# Patient Record
Sex: Female | Born: 1945 | Race: Black or African American | Hispanic: No | Marital: Single | Smoking: Never smoker
Health system: Southern US, Community
[De-identification: ages and names within clinical notes are randomized; demographics above are authoritative.]

## PROBLEM LIST (undated history)

## (undated) DIAGNOSIS — I10 Essential (primary) hypertension: Secondary | ICD-10-CM

## (undated) DIAGNOSIS — E049 Nontoxic goiter, unspecified: Secondary | ICD-10-CM

## (undated) DIAGNOSIS — Z95810 Presence of automatic (implantable) cardiac defibrillator: Secondary | ICD-10-CM

## (undated) DIAGNOSIS — Z515 Encounter for palliative care: Secondary | ICD-10-CM

## (undated) DIAGNOSIS — I471 Supraventricular tachycardia, unspecified: Secondary | ICD-10-CM

## (undated) DIAGNOSIS — E876 Hypokalemia: Secondary | ICD-10-CM

## (undated) DIAGNOSIS — Z86718 Personal history of other venous thrombosis and embolism: Secondary | ICD-10-CM

## (undated) DIAGNOSIS — I428 Other cardiomyopathies: Secondary | ICD-10-CM

## (undated) DIAGNOSIS — I447 Left bundle-branch block, unspecified: Secondary | ICD-10-CM

## (undated) DIAGNOSIS — I509 Heart failure, unspecified: Secondary | ICD-10-CM

## (undated) DIAGNOSIS — I5022 Chronic systolic (congestive) heart failure: Secondary | ICD-10-CM

## (undated) DIAGNOSIS — E785 Hyperlipidemia, unspecified: Secondary | ICD-10-CM

## (undated) DIAGNOSIS — I472 Ventricular tachycardia, unspecified: Secondary | ICD-10-CM

## (undated) HISTORY — DX: Hyperlipidemia, unspecified: E78.5

## (undated) HISTORY — PX: OTHER SURGICAL HISTORY: SHX169

## (undated) HISTORY — PX: CARDIAC CATHETERIZATION: SHX172

## (undated) HISTORY — DX: Hypokalemia: E87.6

## (undated) HISTORY — DX: Ventricular tachycardia: I47.2

## (undated) HISTORY — DX: Ventricular tachycardia, unspecified: I47.20

## (undated) HISTORY — DX: Heart failure, unspecified: I50.9

## (undated) HISTORY — DX: Essential (primary) hypertension: I10

## (undated) HISTORY — DX: Nontoxic goiter, unspecified: E04.9

---

## 1999-01-27 ENCOUNTER — Encounter: Payer: Self-pay | Admitting: Emergency Medicine

## 1999-01-27 ENCOUNTER — Emergency Department (HOSPITAL_COMMUNITY): Admission: EM | Admit: 1999-01-27 | Discharge: 1999-01-27 | Payer: Self-pay | Admitting: Emergency Medicine

## 1999-05-04 DIAGNOSIS — E049 Nontoxic goiter, unspecified: Secondary | ICD-10-CM

## 1999-05-04 HISTORY — DX: Nontoxic goiter, unspecified: E04.9

## 2000-01-20 ENCOUNTER — Other Ambulatory Visit: Admission: RE | Admit: 2000-01-20 | Discharge: 2000-01-20 | Payer: Self-pay | Admitting: Family Medicine

## 2000-01-25 ENCOUNTER — Ambulatory Visit (HOSPITAL_COMMUNITY): Admission: RE | Admit: 2000-01-25 | Discharge: 2000-01-25 | Payer: Self-pay | Admitting: Family Medicine

## 2000-01-25 ENCOUNTER — Encounter: Payer: Self-pay | Admitting: Family Medicine

## 2007-05-04 DIAGNOSIS — Z95810 Presence of automatic (implantable) cardiac defibrillator: Secondary | ICD-10-CM

## 2007-05-04 HISTORY — DX: Presence of automatic (implantable) cardiac defibrillator: Z95.810

## 2007-05-13 ENCOUNTER — Inpatient Hospital Stay (HOSPITAL_COMMUNITY): Admission: EM | Admit: 2007-05-13 | Discharge: 2007-05-15 | Payer: Self-pay | Admitting: Emergency Medicine

## 2007-05-15 ENCOUNTER — Encounter (INDEPENDENT_AMBULATORY_CARE_PROVIDER_SITE_OTHER): Payer: Self-pay | Admitting: *Deleted

## 2007-07-06 ENCOUNTER — Ambulatory Visit: Payer: Self-pay | Admitting: Cardiology

## 2007-07-20 ENCOUNTER — Ambulatory Visit: Payer: Self-pay | Admitting: Cardiology

## 2007-08-02 ENCOUNTER — Ambulatory Visit: Payer: Self-pay | Admitting: Cardiology

## 2007-08-02 LAB — CONVERTED CEMR LAB
BUN: 16 mg/dL (ref 6–23)
Basophils Relative: 0.7 % (ref 0.0–1.0)
CO2: 26 meq/L (ref 19–32)
Calcium: 9.7 mg/dL (ref 8.4–10.5)
Chloride: 109 meq/L (ref 96–112)
Creatinine, Ser: 1 mg/dL (ref 0.4–1.2)
Eosinophils Absolute: 0.1 10*3/uL (ref 0.0–0.7)
Eosinophils Relative: 1.9 % (ref 0.0–5.0)
GFR calc non Af Amer: 60 mL/min
Hemoglobin: 13.1 g/dL (ref 12.0–15.0)
MCV: 89.6 fL (ref 78.0–100.0)
Neutro Abs: 5.2 10*3/uL (ref 1.4–7.7)
Neutrophils Relative %: 67.6 % (ref 43.0–77.0)
RBC: 4.45 M/uL (ref 3.87–5.11)
WBC: 7.7 10*3/uL (ref 4.5–10.5)

## 2007-08-04 ENCOUNTER — Ambulatory Visit (HOSPITAL_COMMUNITY): Admission: RE | Admit: 2007-08-04 | Discharge: 2007-08-04 | Payer: Self-pay | Admitting: Cardiology

## 2007-08-04 ENCOUNTER — Ambulatory Visit: Payer: Self-pay | Admitting: Cardiology

## 2007-09-07 ENCOUNTER — Ambulatory Visit: Payer: Self-pay | Admitting: Cardiology

## 2008-01-01 ENCOUNTER — Ambulatory Visit: Payer: Self-pay | Admitting: Cardiology

## 2008-07-26 DIAGNOSIS — I428 Other cardiomyopathies: Secondary | ICD-10-CM

## 2008-07-26 DIAGNOSIS — J811 Chronic pulmonary edema: Secondary | ICD-10-CM | POA: Insufficient documentation

## 2008-07-26 DIAGNOSIS — E876 Hypokalemia: Secondary | ICD-10-CM | POA: Insufficient documentation

## 2008-07-26 DIAGNOSIS — I471 Supraventricular tachycardia, unspecified: Secondary | ICD-10-CM | POA: Insufficient documentation

## 2008-07-26 DIAGNOSIS — E785 Hyperlipidemia, unspecified: Secondary | ICD-10-CM

## 2008-07-26 DIAGNOSIS — I10 Essential (primary) hypertension: Secondary | ICD-10-CM | POA: Insufficient documentation

## 2008-07-26 DIAGNOSIS — I82409 Acute embolism and thrombosis of unspecified deep veins of unspecified lower extremity: Secondary | ICD-10-CM | POA: Insufficient documentation

## 2008-07-26 DIAGNOSIS — I509 Heart failure, unspecified: Secondary | ICD-10-CM | POA: Insufficient documentation

## 2008-07-29 ENCOUNTER — Encounter: Payer: Self-pay | Admitting: Cardiology

## 2008-07-29 ENCOUNTER — Ambulatory Visit: Payer: Self-pay | Admitting: Cardiology

## 2009-01-31 HISTORY — PX: CARDIAC DEFIBRILLATOR PLACEMENT: SHX171

## 2009-02-21 ENCOUNTER — Ambulatory Visit: Payer: Self-pay | Admitting: Cardiology

## 2009-02-21 ENCOUNTER — Inpatient Hospital Stay (HOSPITAL_COMMUNITY): Admission: EM | Admit: 2009-02-21 | Discharge: 2009-02-26 | Payer: Self-pay | Admitting: Emergency Medicine

## 2009-02-22 ENCOUNTER — Encounter: Payer: Self-pay | Admitting: Cardiology

## 2009-02-24 ENCOUNTER — Encounter: Payer: Self-pay | Admitting: Internal Medicine

## 2009-02-25 ENCOUNTER — Encounter: Payer: Self-pay | Admitting: Cardiology

## 2009-02-26 ENCOUNTER — Encounter: Payer: Self-pay | Admitting: Internal Medicine

## 2009-02-27 ENCOUNTER — Telehealth: Payer: Self-pay | Admitting: Internal Medicine

## 2009-03-05 ENCOUNTER — Ambulatory Visit: Payer: Self-pay

## 2009-03-05 ENCOUNTER — Encounter: Payer: Self-pay | Admitting: Internal Medicine

## 2009-03-07 ENCOUNTER — Encounter: Payer: Self-pay | Admitting: Cardiology

## 2009-03-18 ENCOUNTER — Encounter: Payer: Self-pay | Admitting: Cardiology

## 2009-03-19 ENCOUNTER — Ambulatory Visit: Payer: Self-pay | Admitting: Cardiology

## 2009-03-24 ENCOUNTER — Ambulatory Visit: Payer: Self-pay | Admitting: Cardiology

## 2009-03-24 DIAGNOSIS — I5022 Chronic systolic (congestive) heart failure: Secondary | ICD-10-CM

## 2009-03-25 LAB — CONVERTED CEMR LAB
BUN: 23 mg/dL (ref 6–23)
CO2: 31 meq/L (ref 19–32)
Chloride: 99 meq/L (ref 96–112)
Creatinine, Ser: 1.3 mg/dL — ABNORMAL HIGH (ref 0.4–1.2)

## 2009-04-21 ENCOUNTER — Ambulatory Visit: Payer: Self-pay | Admitting: Cardiology

## 2009-05-09 ENCOUNTER — Encounter (INDEPENDENT_AMBULATORY_CARE_PROVIDER_SITE_OTHER): Payer: Self-pay | Admitting: *Deleted

## 2009-05-27 ENCOUNTER — Ambulatory Visit: Payer: Self-pay | Admitting: Internal Medicine

## 2009-08-26 ENCOUNTER — Ambulatory Visit: Payer: Self-pay | Admitting: Internal Medicine

## 2009-10-02 ENCOUNTER — Ambulatory Visit: Payer: Self-pay | Admitting: Cardiology

## 2009-10-03 ENCOUNTER — Telehealth: Payer: Self-pay | Admitting: Cardiology

## 2010-02-24 ENCOUNTER — Encounter: Payer: Self-pay | Admitting: Internal Medicine

## 2010-02-24 ENCOUNTER — Ambulatory Visit: Payer: Self-pay | Admitting: Internal Medicine

## 2010-02-24 DIAGNOSIS — Z95810 Presence of automatic (implantable) cardiac defibrillator: Secondary | ICD-10-CM | POA: Insufficient documentation

## 2010-04-09 ENCOUNTER — Encounter: Payer: Self-pay | Admitting: Cardiology

## 2010-04-09 ENCOUNTER — Ambulatory Visit: Payer: Self-pay | Admitting: Cardiology

## 2010-04-09 DIAGNOSIS — G4700 Insomnia, unspecified: Secondary | ICD-10-CM

## 2010-04-14 ENCOUNTER — Ambulatory Visit: Payer: Self-pay | Admitting: Cardiology

## 2010-04-17 ENCOUNTER — Telehealth (INDEPENDENT_AMBULATORY_CARE_PROVIDER_SITE_OTHER): Payer: Self-pay | Admitting: *Deleted

## 2010-04-17 ENCOUNTER — Encounter
Admission: RE | Admit: 2010-04-17 | Discharge: 2010-04-17 | Payer: Self-pay | Source: Home / Self Care | Attending: Family Medicine | Admitting: Family Medicine

## 2010-04-20 ENCOUNTER — Encounter: Payer: Self-pay | Admitting: Cardiology

## 2010-04-20 LAB — CONVERTED CEMR LAB
ALT: 26 U/L
AST: 22 U/L
Albumin: 3.9 g/dL
Alkaline Phosphatase: 68 U/L
Bilirubin, Direct: 0.2 mg/dL
Cholesterol: 153 mg/dL
HDL: 44.6 mg/dL
LDL Cholesterol: 92 mg/dL
Total Bilirubin: 1.1 mg/dL
Total CHOL/HDL Ratio: 3
Total Protein: 6.5 g/dL
Triglycerides: 84 mg/dL
VLDL: 16.8 mg/dL

## 2010-05-05 ENCOUNTER — Encounter: Payer: Self-pay | Admitting: Internal Medicine

## 2010-05-05 ENCOUNTER — Telehealth: Payer: Self-pay | Admitting: Internal Medicine

## 2010-05-06 ENCOUNTER — Ambulatory Visit
Admission: RE | Admit: 2010-05-06 | Discharge: 2010-05-06 | Payer: Self-pay | Source: Home / Self Care | Attending: Internal Medicine | Admitting: Internal Medicine

## 2010-05-06 ENCOUNTER — Encounter: Payer: Self-pay | Admitting: Internal Medicine

## 2010-05-21 ENCOUNTER — Encounter: Payer: Self-pay | Admitting: Cardiology

## 2010-05-21 ENCOUNTER — Encounter: Payer: Self-pay | Admitting: Internal Medicine

## 2010-05-21 ENCOUNTER — Telehealth: Payer: Self-pay | Admitting: Internal Medicine

## 2010-06-02 NOTE — Progress Notes (Signed)
Summary: Need to verify medication   Phone Note From Pharmacy Call back at [phone]   Caller: [facility] REF# [id number] Summary of Call: Pt needs to get Benazepril 10 mg verified Initial call taken by: Judie Grieve,  October 03, 2009 1:45 PM  Follow-up for Phone Call        Spoke with pharmacy. Marrion Coy, CNA  October 03, 2009 2:36 PM  Follow-up by: Marrion Coy, CNA,  October 03, 2009 2:36 PM

## 2010-06-02 NOTE — Letter (Signed)
Summary: [employer], Main Office  [address]   [place], [place] [address]   Phone: [phone]  Fax: [phone]         April 09, 2010    Kaitlin [name]   The above named patient had a medical visit today.  Please take this into consideration when reviewing the time away from work/school.      Sincerely yours,     Sander Nephew, RN for  Dr. Rollene Rotunda [facility]

## 2010-06-02 NOTE — Assessment & Plan Note (Signed)
Summary: device/saf      Allergies Added: NKDA  Visit Type:  Follow-up Primary Provider:  Mirna Mires, MD   History of Present Illness: Ms. Donovan returns today for followup.  She is a pleasant 65 yo woman with a h/o DCM, CHF, and dyslipidemia.  She returns today for ICD followup.  She denies c/p, sob, or peripheral edema.  She denies intercurrent ICD therapies.  She continues to work regularly and her CHF remains class 2.  Current Medications (verified): 1)  Benazepril Hcl 10 Mg Tabs (Benazepril Hcl) .[name].. 1 and 1/2 Tablet Daily 2)  Coreg 12.5 Mg Tabs (Carvedilol) .[name].. 1 1/2 By Mouth Two Times A Day 3)  Simvastatin 40 Mg Tabs (Simvastatin) .... One By Mouth Daily 4)  Fish Oil .... One By Mouth Daily 5)  Mvi .... One By Mouth Daliy 6)  Asprin 81mg  .... One By Mouth Daily 7)  Tylenol 325 Mg Tabs (Acetaminophen) .... As Needed 8)  Gabapentin 100 Mg Caps (Gabapentin) .... Two At Bedtime As Needed 9)  Lasix 40 Mg Tabs (Furosemide) .... One Tablet Daily or As Directed 10)  Potassium Chloride Crys Cr 20 Meq Cr-Tabs (Potassium Chloride Crys Cr) .... One Daily 11)  Ambien 5 Mg Tabs (Zolpidem Tartrate) .... 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    Vital Signs:  Patient profile:   65 year old female Height:      64 inches Weight:      180 pounds BMI:     31.01 Pulse rate:   79 / minute BP sitting:   96 / 72  (left arm)  Vitals Entered By: Laurance Flatten CMA (August 26, 2009 3:59  PM)  Physical Exam  General:  Well developed, well nourished, in no acute distress. Head:  normocephalic and atraumatic Eyes:  PERRLA/EOM intact; conjunctiva and lids normal. Mouth:  Teeth, gums and palate normal. Oral mucosa normal. Neck:  Neck supple, no JVD. No masses, thyromegaly or abnormal cervical nodes. Chest Wall:  well-healed ICD pocket Lungs:  Clear bilaterally to auscultation. No wheezes, rales, or rhonchi Heart:  RRR with normal S1 and S2. Abdomen:  Bowel sounds positive; abdomen soft and non-tender without masses, organomegaly, or hernias noted. No hepatosplenomegaly. Msk:  Back normal, normal gait. Muscle strength and tone normal. Pulses:  pulses normal in all 4 extremities Extremities:  mild nonpitting lower extremity edema Neurologic:  Alert and oriented x 3.    ICD Specifications Following MD:  Lewayne Bunting, MD     ICD Vendor:  [employer]     ICD Model Number:  540-VRT     ICD Serial Number:  [id number] ICD DOI:  02/24/2009     ICD Implanting MD:  Lewayne Bunting, MD  Lead 1:    Location: RV     DOI: 02/24/2009     Model #: [id number]     Serial #: [id number]     Status: active  Indications::  VT   ICD Follow Up Remote Check?  No Battery Voltage:  3.09 V     Charge Time:  10.5 seconds  Underlying rhythm:  SR@75  ICD Dependent:  No       ICD Device Measurements Right Ventricle:  Amplitude: 12.7 mV, Impedance: 592 ohms, Threshold: 0.4 V at 0.5 msec  Episodes Coumadin:  No Shock:  0     ATP:  3     Nonsustained:  0     Ventricular Pacing:  1%  Brady Parameters Mode VVI     Lower Rate Limit:  45      Tachy Zones VF:  240     VT:  207     VT1:  176     Next Remote Date:  11/27/2009     Next Cardiology Appt Due:  01/31/2010 Tech Comments:  No parameter changes.  Device function normal.  Checked by industry.  Remote every 3 months.  ROV 10/11 with Dr. Ladona Ridgel. Altha Harm, LPN  August 26, 2009 4:28 PM  MD Comments:  2 episodes of VT with successful ATP.  Impression &  Recommendations:  Problem # 1:  VENTRICULAR TACHYCARDIA (ICD-427.1) The patient had two episodes which were terminated with ATP. This is a new problem.  I have considered starting an anti-arrhythmic drug but I will hold on this for now. Her updated medication list for this problem includes:    Benazepril Hcl 10 Mg Tabs (Benazepril hcl) .[name]... 1 and 1/2 tablet daily    Coreg 12.5 Mg Tabs (Carvedilol) .[name]... 1 1/2 by mouth two times a day  Problem # 2:  CHRONIC SYSTOLIC HEART FAILURE (ICD-428.22) Her CHF is class 2. She will continue her current meds and maintain a low sodium diet. Her updated medication list for this problem includes:    Benazepril Hcl 10 Mg Tabs (Benazepril hcl) .[name]... 1 and 1/2 tablet daily    Coreg 12.5 Mg Tabs (Carvedilol) .[name]... 1 1/2 by mouth two times a day    Lasix 40 Mg Tabs (Furosemide) ..... One tablet daily or as directed  Problem # 3:  DYSLIPIDEMIA (ICD-272.4) A low fat diet and her current medical therapy are recommended. Her updated medication list for this problem includes:    Simvastatin 40 Mg Tabs (Simvastatin) ..... One by mouth daily  Patient Instructions: 1)  Your physician recommends that you schedule a follow-up appointment in: 01/2010 with Dr Ladona Ridgel

## 2010-06-02 NOTE — Letter (Signed)
Summary: Appointment - Reminder 2  [employer], Main Office  [address]   [place], [place] [address]   Phone: [phone]  Fax: [phone]     May 09, 2009 MRN: [id number]   [facility] [address] [place], [place]  [address]   Dear Kaitlin [name],  Our records indicate that it is time to schedule a follow-up appointment.Dr.Hochrein recommended that you follow up with [place] in March,2011. It is very important that we reach you to schedule this appointment. We look forward to participating in your health care needs. Please contact us at the number listed above at your earliest convenience to schedule your appointment.  If you are unable to make an appointment at this time, give [place] a call so we can update our records.     Sincerely,   Glass blower/designer

## 2010-06-02 NOTE — Assessment & Plan Note (Signed)
Summary: per check out/sf  Medications Added CARVEDILOL 25 MG TABS (CARVEDILOL) one bid      Allergies Added: NKDA  Visit Type:  Follow-up Primary Provider:  Mirna Mires, MD  CC:  Cardiomyopathy.  History of Present Illness: The patient presents for followup of her known cardiomyopathy nonischemic. She also has a history of V. tach. Since I last saw her she has had no new cardiovascular complaints. She does try to walk for exercise and does this routinely. With this she does not get chest pressure, neck or arm discomfort. She does not report palpitations, presyncope or syncope. She has no PND or orthopnea. She has had no symptomatic tachypalpitations and no presyncope or syncope.  Current Medications (verified): 1)  Benazepril Hcl 10 Mg Tabs (Benazepril Hcl) .[name].. 1 and 1/2 Tablet Daily 2)  Carvedilol 25 Mg Tabs (Carvedilol) .... One Bid 3)  Simvastatin 40 Mg Tabs (Simvastatin) .... One By Mouth Daily 4)  Fish Oil .... One By Mouth Daily 5)  Mvi .... One By Mouth Daliy 6)  Asprin 81mg  .... One By Mouth Daily 7)  Tylenol 325 Mg Tabs (Acetaminophen) .... As Needed 8)  Gabapentin 100 Mg Caps (Gabapentin) .... Two At Bedtime As Needed 9)  Lasix 40 Mg Tabs (Furosemide) .... One Tablet Daily or As Directed 10)  Potassium Chloride Crys Cr 20 Meq Cr-Tabs (Potassium Chloride Crys Cr) .... One Daily 11)  Ambien 5 Mg Tabs (Zolpidem Tartrate) .... One By Mouth At Bedtime As Needed  Allergies (verified): No Known Drug Allergies  Past History:  Past Medical History: Reviewed history from 03/19/2009 and no changes required. 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   Vital Signs:  Patient profile:   65 year old female Height:      64 inches Weight:      175 pounds BMI:     30.15 Temp:     98.2 degrees F oral Pulse rate:   90 / minute BP sitting:   124 / 90  (left arm)  Vitals Entered By: Laurance Flatten CMA (October 02, 2009 4:35 PM)  Physical Exam  General:  Well developed, well nourished, in no acute distress. Head:  normocephalic and atraumatic Eyes:  PERRLA/EOM intact; conjunctiva and lids normal. Neck:  Neck supple, no JVD. No masses, thyromegaly or abnormal cervical nodes. Chest Wall:  well-healed ICD pocket Lungs:  Clear bilaterally to auscultation. No wheezes, rales, or rhonchi Abdomen:  Bowel sounds positive; abdomen soft and non-tender without masses, organomegaly, or hernias noted. No hepatosplenomegaly. Msk:  Back normal, normal gait. Muscle strength and tone normal. Extremities:  No clubbing or cyanosis. Neurologic:  Alert and oriented x 3. Skin:  Intact without lesions or rashes. Cervical Nodes:  no significant adenopathy Axillary Nodes:  no significant adenopathy Psych:  Normal affect.   Detailed Cardiovascular Exam  Neck    Carotids: Carotids full and equal bilaterally without bruits.      Neck Veins: Normal, no JVD.    Heart    Inspection: no deformities or lifts noted.      Palpation: normal PMI with no thrills palpable.      Auscultation: regular rate and rhythm, S1, S2 without murmurs, rubs, gallops,  or clicks.    Vascular    Abdominal Aorta: no palpable masses, pulsations, or audible bruits.      Femoral Pulses: normal femoral pulses bilaterally.      Pedal Pulses: pulses normal in all 4 extremities    Radial Pulses: normal radial pulses bilaterally.      Peripheral Circulation: no clubbing, cyanosis, or edema noted with normal capillary refill.     EKG  Procedure date:  10/02/2009  Findings:      Sinus rhythm normal rate 87, left axis deviation interventricular conduction delay, poor anterior  R-wave progression   ICD Specifications Following MD:  Lewayne Bunting, MD     ICD Vendor:  [employer]     ICD Model Number:  540-VRT     ICD Serial Number:  [id number] ICD DOI:  02/24/2009     ICD Implanting MD:  Lewayne Bunting, MD  Lead 1:    Location: RV     DOI: 02/24/2009     Model #: [id number]     Serial #: [id number]     Status: active  Indications::  VT   ICD Follow Up ICD Dependent:  No      Episodes Coumadin:  No  Brady Parameters Mode VVI     Lower Rate Limit:  45      Tachy Zones VF:  240     VT:  207     VT1:  176     Impression & Recommendations:  Problem # 1:  VENTRICULAR TACHYCARDIA (ICD-427.1) She has had some nonsustained episodes since having her eye she placed a sustained episode the Irene's. No further testing is indicated. She will continue with routine defibrillator check. Orders: EKG w/ Interpretation (93000)  Problem # 2:  CHRONIC SYSTOLIC HEART FAILURE (ICD-428.22) Today I will titrate her carvedilol to 25 mg b.i.d. Uptitrate her face as well over time. Once I have achieved target levels of meds I will followup with an EKG.  Problem # 3:  DYSLIPIDEMIA (ICD-272.4) When she comes back for her next appointment I have discussed with her to come fasting and I will check a lipid profile.  Patient Instructions: 1)  Your physician recommends that you schedule a follow-up appointment in: 6 months with Dr Antoine Poche 2)  Your physician has recommended you make the following change in your medication: Increase carvedilol 25 mg one twice a day Prescriptions: CARVEDILOL 25 MG TABS (CARVEDILOL) one bid  #60 x 11   Entered by:   Charolotte Capuchin, RN   Authorized by:   Rollene Rotunda, MD, [facility]   Signed by:   Charolotte Capuchin, RN on 10/02/2009   Method used:   Electronically to        [facility] (retail)       [address]       [place], [place]  [address]       Ph: [phone]       Fax: [phone]   RxID:   [id number]

## 2010-06-02 NOTE — Assessment & Plan Note (Signed)
Summary: biotronic/saf      Allergies Added: NKDA  Visit Type:  Follow-up Primary Provider:  Mirna Mires, MD   History of Present Illness: Kaitlin [name] returns today for followup.  She is a pleasant 65 yo woman with a h/o chronic CHF, HTN, and dyslipidemia.  She is s/p ICD implant.  She denies c/p or ICD therapies. Her CHF symptoms remain class 2.  She continues to work full time.  Current Medications (verified): 1)  Benazepril Hcl 10 Mg Tabs (Benazepril Hcl) .[name].. 1 and 1/2 Tablet Daily 2)  Carvedilol 25 Mg Tabs (Carvedilol) .... One Bid 3)  Simvastatin 40 Mg Tabs (Simvastatin) .... One By Mouth Daily 4)  Fish Oil .... One By Mouth Daily 5)  Mvi .... One By Mouth Daliy 6)  Asprin 81mg  .... One By Mouth Daily 7)  Tylenol 325 Mg Tabs (Acetaminophen) .... As Needed 8)  Lasix 40 Mg Tabs (Furosemide) .... One Tablet Daily or As Directed 9)  Potassium Chloride Crys Cr 20 Meq Cr-Tabs (Potassium Chloride Crys Cr) .... One Daily 10)  Ambien 5 Mg Tabs (Zolpidem Tartrate) .... 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    Vital Signs:  Patient profile:   65 year old female Height:      64 inches Weight:      178 pounds BMI:     30.66 Pulse rate:   77 / minute BP sitting:   102 / 80  (left arm)  Vitals Entered By: Laurance Flatten CMA (February 24, 2010 3:16 PM)  Physical Exam  General:  Well developed, well nourished, in no acute distress. Head:  normocephalic and atraumatic Eyes:  PERRLA/EOM  intact; conjunctiva and lids normal. Mouth:  Teeth, gums and palate normal. Oral mucosa normal. Neck:  Neck supple, no JVD. No masses, thyromegaly or abnormal cervical nodes. Chest Wall:  well-healed ICD pocket Lungs:  Clear bilaterally to auscultation. No wheezes, rales, or rhonchi Heart:  RRR with normal S1 and S2. Abdomen:  Bowel sounds positive; abdomen soft and non-tender without masses, organomegaly, or hernias noted. No hepatosplenomegaly. Msk:  Back normal, normal gait. Muscle strength and tone normal. Pulses:  pulses normal in all 4 extremities Extremities:  No clubbing or cyanosis. Neurologic:  Alert and oriented x 3.    ICD Specifications Following MD:  Lewayne Bunting, MD     ICD Vendor:  [employer]     ICD Model Number:  540-VRT     ICD Serial Number:  [id number] ICD DOI:  02/24/2009     ICD Implanting MD:  Lewayne Bunting, MD  Lead 1:    Location: RV     DOI: 02/24/2009     Model #: [id number]     Serial #: [id number]     Status: active  Indications::  VT   ICD Follow Up Remote Check?  No Battery Voltage:  3.10 V     Charge Time:  10.8 seconds     Underlying rhythm:  SR@77  ICD Dependent:  No       ICD Device Measurements Right Ventricle:  Amplitude: 11.4 mV, Impedance: 578  ohms, Threshold: 0.4 V at 0.5 msec  Episodes Coumadin:  No Shock:  0     ATP:  0     Nonsustained:  0     Ventricular Pacing:  1%  Brady Parameters Mode VVI     Lower Rate Limit:  45      Tachy Zones VF:  240     VT:  207     VT1:  176     Next Cardiology Appt Due:  05/03/2010 Tech Comments:  No parameter changes.  Device function normal.  Checked by industry with research.  ROV 3months clinic and 6 months for research. Altha Harm, LPN  February 24, 2010 3:33 PM  MD Comments:  Agree with above.    Impression & Recommendations:  Problem # 1:  CHRONIC SYSTOLIC HEART FAILURE (ICD-428.22) Her symptoms are well controlled. A low sodium diet has been emphasized.  She will continue her meds as below. Her  updated medication list for this problem includes:    Benazepril Hcl 10 Mg Tabs (Benazepril hcl) .[name]... 1 and 1/2 tablet daily    Carvedilol 25 Mg Tabs (Carvedilol) ..... One bid    Lasix 40 Mg Tabs (Furosemide) ..... One tablet daily or as directed  Problem # 2:  VENTRICULAR TACHYCARDIA (ICD-427.1) She has had no recurrent symptoms.  Will follow. Her updated medication list for this problem includes:    Benazepril Hcl 10 Mg Tabs (Benazepril hcl) .[name]... 1 and 1/2 tablet daily    Carvedilol 25 Mg Tabs (Carvedilol) ..... One bid  Problem # 3:  AUTOMATIC IMPLANTABLE CARDIAC DEFIBRILLATOR SITU (ICD-V45.02) Her device is working normally.  Will recheck in several months.  Patient Instructions: 1)  Your physician recommends that you schedule a follow-up appointment in: 3 months with device clinic and 6 months with Dr Ladona Ridgel and research

## 2010-06-02 NOTE — Letter (Signed)
Summary: [employer], Main Office  [address]   [place], [place] [address]   Phone: [phone]  Fax: [phone]     August 26, 2009    Kaitlin [name]   The above named patient had a medical visit today at:  3:40  pm with Dr Ladona Ridgel.  Please take this into consideration when reviewing the time away from work.      Sincerely yours,          Architectural technologist

## 2010-06-02 NOTE — Assessment & Plan Note (Signed)
Summary: PC2/GD  Medications Added COREG 12.5 MG TABS (CARVEDILOL) 1 1/2 by mouth two times a day AMBIEN 5 MG TABS (ZOLPIDEM TARTRATE) one by mouth at bedtime as needed      Allergies Added: NKDA  Visit Type:  Follow-up Primary Provider:  Mirna Mires, MD  CC:  sob/ Pain in defib. site.  History of Present Illness: Kaitlin [name] returns today for followup.  She is a pleasant 65 yo woman with a h/o DCM and CHF, who is s/p ICD implant.  She has had no intercurrent ICD shocks.  She is exercising 30 minutes a day and sometimes is short of breath.  No peripheral edema.  Current Medications (verified): 1)  Benazepril Hcl 10 Mg Tabs (Benazepril Hcl) .[name].. 1 and 1/2 Tablet Daily 2)  Coreg 12.5 Mg Tabs (Carvedilol) .... One By Mouth Two Times A Day 3)  Simvastatin 40 Mg Tabs (Simvastatin) .... One By Mouth Daily 4)  Fish Oil .... One By Mouth Daily 5)  Mvi .... One By Mouth Daliy 6)  Asprin 81mg  .... One By Mouth Daily 7)  Tylenol 325 Mg Tabs (Acetaminophen) .... As Needed 8)  Gabapentin 100 Mg Caps (Gabapentin) .... Two At Bedtime As Needed 9)  Fish Oil   Oil (Fish Oil) .[name].. 1 By Mouth Daily 10)  Lasix 40 Mg Tabs (Furosemide) .... One Tablet Daily or As Directed 11)  Potassium Chloride Crys Cr 20 Meq Cr-Tabs (Potassium Chloride Crys Cr) .... 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  The patient denies chest pain, syncope, and peripheral edema.    Vital Signs:  Patient profile:   65 year old female Height:      64 inches Weight:      182  pounds BMI:     31.35 Pulse rate:   64 / minute BP sitting:   124 / 84  (left arm)  Vitals Entered By: Laurance Flatten CMA (May 27, 2009 2:23 PM)  Physical Exam  General:  Well developed, well nourished, in no acute distress. Head:  normocephalic and atraumatic Eyes:  PERRLA/EOM intact; conjunctiva and lids normal. Mouth:  Teeth, gums and palate normal. Oral mucosa normal. Neck:  Neck supple, no JVD. No masses, thyromegaly or abnormal cervical nodes. Chest Wall:  well-healed ICD pocket Lungs:  Clear bilaterally to auscultation. No wheezes, rales, or rhonchi Heart:  RRR with normal S1 and S2. Abdomen:  Bowel sounds positive; abdomen soft and non-tender without masses, organomegaly, or hernias noted. No hepatosplenomegaly. Msk:  Back normal, normal gait. Muscle strength and tone normal. Pulses:  pulses normal in all 4 extremities Extremities:  mild nonpitting lower extremity edema Neurologic:  Alert and oriented x 3.    ICD Specifications Following MD:  Lewayne Bunting, MD     ICD Vendor:  [employer]     ICD Model Number:  540-VRT     ICD Serial Number:  [id number] ICD DOI:  02/24/2009     ICD Implanting MD:  Lewayne Bunting, MD  Lead 1:    Location: RV     DOI: 02/24/2009     Model #: [id number]  Serial #: [id number]     Status: active  Indications::  VT   ICD Follow Up Remote Check?  No Battery Voltage:  3.09 V     Charge Time:  10.4 seconds     Underlying rhythm:  SR ICD Dependent:  No       ICD Device Measurements Right Ventricle:  Amplitude: 12 mV, Impedance: 660 ohms, Threshold: 0.4 V at 0.5 msec Shock Impedance: 46 ohms   Episodes Coumadin:  No Shock:  0     ATP:  2     Nonsustained:  2     Ventricular Pacing:  <1%  Brady Parameters Mode VVI     Lower Rate Limit:  45      Tachy Zones VF:  240     VT:  207     VT1:  176     Next Remote Date:  08/25/2009     Next Cardiology Appt Due:  01/31/2010 Tech Comments:  Normal device function.  RV output decresaed today to chronic  outputs.  No other changes made.  2 episodes of VT treated with ATP, 2 episodes of SVT that the device did not treat.  Pt does home monitoring.  ROV 10-11 GT. Kaitlin Balsam RN BSN  May 27, 2009 2:33 PM  MD Comments:  Agree with above, though I suspect all episodes are VT.    Impression & Recommendations:  Problem # 1:  VENTRICULAR TACHYCARDIA (ICD-427.1) I have asked her to increase her carvedilol to 1 1/2 tabs twice daily. Her updated medication list for this problem includes:    Benazepril Hcl 10 Mg Tabs (Benazepril hcl) .[name]... 1 and 1/2 tablet daily    Coreg 12.5 Mg Tabs (Carvedilol) .[name]... 1 1/2 by mouth two times a day  Problem # 2:  CHRONIC SYSTOLIC HEART FAILURE (ICD-428.22) she appears to be a bit overloaded today and has more dyspnea.  I have asked her to take an additional lasix for two days. Her updated medication list for this problem includes:    Benazepril Hcl 10 Mg Tabs (Benazepril hcl) .[name]... 1 and 1/2 tablet daily    Coreg 12.5 Mg Tabs (Carvedilol) .[name]... 1 1/2 by mouth two times a day    Lasix 40 Mg Tabs (Furosemide) ..... One tablet daily or as directed  Patient Instructions: 1)  Your physician recommends that you schedule a follow-up appointment in: 3 months with Dr Ladona Ridgel 2)  Your physician has recommended you make the following change in your medication: Increase Lasix 40mg  2 tablets daily for 2 days then resume one daily take an extra Potassium for those 2 days as well 3)  increase Carvedilol 12.5mg  1 1/2 tablets twice daily Prescriptions: AMBIEN 5 MG TABS (ZOLPIDEM TARTRATE) one by mouth at bedtime as needed  #30 x 0   Entered by:   Dennis Bast, RN, BSN   Authorized by:   Laren Boom, MD, [facility]   Signed by:   Dennis Bast, RN, BSN on 05/27/2009   Method used:   Print then Give to Patient   RxID:   [id number] COREG 12.5 MG TABS (CARVEDILOL) 1 1/2 by mouth two times a day  #75 x 3   Entered by:   Dennis Bast, RN, BSN   Authorized by:   Laren Boom, MD, [facility]   Signed by:   Dennis Bast, RN, BSN on 05/27/2009   Method used:   Electronically to        [facility] [address] (  retail)       [address]       [place], [place]  [address]       Ph: [phone]       Fax: [phone]   RxID:   [id number]

## 2010-06-02 NOTE — Letter (Signed)
Summary: Work Writer, Main Office  [address]   [place], [place] [address]   Phone: [phone]  Fax: [phone]        Today's Date: October 02, 2009  Name of Patient: Kaitlin [name]  The above named patient had a medical visit today.  Please take this into consideration when reviewing the time away from work/school.    Special Instructions:  [ ]  None  [ x ] To be off the remainder of today, returning to the normal work / school schedule tomorrow.  [  ] To be off until the next scheduled appointment on ______________________.  [  ] Other ________________________________________________________________ ________________________________________________________________________   Sincerely yours,   Charolotte Capuchin, RN for  Dr Rollene Rotunda

## 2010-06-02 NOTE — Assessment & Plan Note (Signed)
Summary: 6 month rov  pfh,rn      Allergies Added: NKDA  Visit Type:  Follow-up Primary Salem Mastrogiovanni:  Mirna Mires, MD  CC:  Cardiomyopathy.  History of Present Illness: The patient presents for followup of cardiomyopathy and coronary disease.  She has done well from a cardiovascular standpoint. She denies any chest pressure, neck or arm discomfort. She has had no palpitations, presyncope or syncope. She has had no weight gain or edema. She exercises routinely at the [facility]. When his level of activity she's not had symptoms. Her defibrillator has been checked and there have been no arrhythmias apparently. She is bothered by insomnia. She also has some mild depression.  Current Medications (verified): 1)  Benazepril Hcl 10 Mg Tabs (Benazepril Hcl) .[name].. 1 and 1/2 Tablet Daily 2)  Carvedilol 25 Mg Tabs (Carvedilol) .... One Bid 3)  Simvastatin 40 Mg Tabs (Simvastatin) .... One By Mouth Daily 4)  Fish Oil .... One By Mouth Daily 5)  Mvi .... One By Mouth Daliy 6)  Asprin 81mg  .... One By Mouth Daily 7)  Tylenol 325 Mg Tabs (Acetaminophen) .... As Needed 8)  Lasix 40 Mg Tabs (Furosemide) .... One Tablet Daily or As Directed 9)  Potassium Chloride Crys Cr 20 Meq Cr-Tabs (Potassium Chloride Crys Cr) .... One Daily 10)  Ambien 5 Mg Tabs (Zolpidem Tartrate) .... One By Mouth At Bedtime As Needed  Allergies (verified): No Known Drug Allergies  Past History:  Past Medical History: Reviewed history from 03/19/2009 and no changes required. 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   Vital Signs:  Patient profile:   65 year old female Height:      64 inches Weight:       177 pounds BMI:     30.49 Pulse rate:   87 / minute Resp:     16 per minute BP sitting:   120 / 74  (right arm)  Vitals Entered By: Marrion Coy, CNA (April 09, 2010 2:50 PM)  Physical Exam  General:  Well developed, well nourished, in no acute distress. Head:  normocephalic and atraumatic Eyes:  PERRLA/EOM intact; conjunctiva and lids normal. Neck:  Neck supple, no JVD. No masses, thyromegaly or abnormal cervical nodes. Chest Wall:  Well-healed ICD pocket Lungs:  Clear bilaterally to auscultation and percussion. Abdomen:  Bowel sounds positive; abdomen soft and non-tender without masses, organomegaly, or hernias noted. No hepatosplenomegaly. Msk:  Back normal, normal gait. Muscle strength and tone normal. Extremities:  No clubbing or cyanosis. Neurologic:  Alert and oriented x 3. Skin:  Intact without lesions or rashes. Cervical Nodes:  no significant adenopathy Axillary Nodes:  no significant adenopathy Inguinal Nodes:  no significant adenopathy Psych:  Normal affect.   Detailed Cardiovascular Exam  Neck    Carotids: Carotids full and equal bilaterally without bruits.      Neck Veins: Normal, no JVD.    Heart    Inspection: no deformities or lifts noted.      Palpation: normal PMI with no thrills palpable.      Auscultation: regular rate and rhythm, S1, S2 without murmurs, rubs, gallops, or clicks.    Vascular    Abdominal Aorta: no palpable masses, pulsations,  or audible bruits.      Femoral Pulses: normal femoral pulses bilaterally.      Pedal Pulses: normal pedal pulses bilaterally.      Radial Pulses: normal radial pulses bilaterally.      Peripheral Circulation: no clubbing, cyanosis, or edema noted with normal capillary refill.     EKG  Procedure date:  04/09/2010  Findings:      Sus rhythm with premature ectopic complexes, first degree AV block, intraventricular conduction delay, left axis deviation   ICD Specifications Following MD:  Lewayne Bunting, MD     ICD Vendor:  [employer]     ICD Model Number:  540-VRT     ICD Serial Number:  [id number] ICD DOI:  02/24/2009     ICD Implanting MD:  Lewayne Bunting, MD  Lead 1:    Location: RV     DOI: 02/24/2009     Model #: [id number]     Serial #: [id number]     Status: active  Indications::  VT   ICD Follow Up ICD Dependent:  No      Episodes Coumadin:  No  Brady Parameters Mode VVI     Lower Rate Limit:  45      Tachy Zones VF:  240     VT:  207     VT1:  176     Impression & Recommendations:  Problem # 1:  CHRONIC SYSTOLIC HEART FAILURE (ICD-428.22) Patient seems to be euvolemic. She says she does have some occasional lightheadedness upon standing so I will not titrate her meds further.  Problem # 2:  VENTRICULAR TACHYCARDIA (ICD-427.1) Her ICD followup is up-to-date. She will continue with meds as listed and follow up with Dr. Ladona Ridgel.  Problem # 3:  HYPOKALEMIA (ICD-276.8) I will have her come back for basic metabolic profile and lipids.  Problem # 4:  INSOMNIA (ICD-780.52) I have taken the liberty of refilling her Ambien for one month. Otherwise I would refer her to her primary doctor for evaluation of insomnia and mild depression.  Other Orders: EKG w/ Interpretation (93000)  Patient Instructions: 1)  Your physician recommends that you schedule a follow-up appointment in: July 2012 with Dr Antoine Poche 2)  Your physician recommends that you return for a FASTING lipid and profile: 272.2 v58.69 3)  Your physician recommends that you continue on your current medications as directed. Please refer to the Current Medication list given to you today. Prescriptions: AMBIEN 5 MG TABS (ZOLPIDEM TARTRATE) one by mouth at bedtime as needed  #30 x 0   Entered by:   Charolotte Capuchin, RN   Authorized by:   Rollene Rotunda, MD, [facility]   Signed by:   Charolotte Capuchin, RN on 04/09/2010   Method used:   Print then Give to Patient   RxID:   [id number]

## 2010-06-02 NOTE — Letter (Signed)
Summary: Work Writer, Main Office  [address]   [place], [place] [address]   Phone: [phone]  Fax: [phone]     February 24, 2010    Kaitlin [name]   The above named patient had a medical visit today at: 02-24-2010.  If any questions, please  call [phone].   Sincerely yours,  Architectural technologist

## 2010-06-04 NOTE — Progress Notes (Signed)
Summary: Questions about defib and the pt passing out on Sunday   Phone Note Call from Patient Call back at [phone]   Caller: Daughter/Antionette Summary of Call: Pt request call regarding the pt defib and her mother passing out on Sunday  Initial call taken by: Judie Grieve,  May 05, 2010 2:25 PM  Follow-up for Phone Call        Sun night around 8pm  sitting at kitchen table said she was getting dizzy then slumped over and it lasted about 10 sec Pt had VT per transmission and was ATP'd out.  I have added her to Dr Lubertha Basque schedule for 05/06/10 at 9:40 i instructed the pt not to drive until she follows up with DrTaylor Dennis Bast, RN, BSN  May 05, 2010 5:10 PM

## 2010-06-04 NOTE — Letter (Signed)
Summary: Custom - Lipid  [facility], Main Office  [address]   [place], [place] [address]   Phone: [phone]  Fax: [phone]     April 20, 2010 MRN: [id number]   [facility] [address] [place], [place]  [address]   Dear Ms. Polgar,  We have reviewed your cholesterol results.  They are as follows:     Total Cholesterol:    153 (Desirable: less than 200)       HDL  Cholesterol:     44.60  (Desirable: greater than 40 for men and 50 for women)       LDL Cholesterol:       92  (Desirable: less than 100 for low risk and less than 70 for moderate to high risk)       Triglycerides:       84.0  (Desirable: less than 150)  Our recommendations include:  Continue current treatment, no changes needed   Call our office at the number listed above if you have any questions.  Lowering your LDL cholesterol is important, but it is only one of a large number of "risk factors" that may indicate that you are at risk for heart disease, stroke or other complications of hardening of the arteries.  Other risk factors include:   A.  Cigarette Smoking* B.  High Blood Pressure* C.  Obesity* D.   Low HDL Cholesterol (see yours above)* E.   Diabetes Mellitus (higher risk if your is uncontrolled) F.  Family history of premature heart disease G.  Previous history of stroke or cardiovascular disease    *These are risk factors YOU HAVE CONTROL OVER.  For more information, visit .            There is now evidence that lowering the TOTAL CHOLESTEROL AND LDL CHOLESTEROL can reduce the risk of heart disease.  The American Heart Association recommends the following guidelines for the treatment of elevated cholesterol:  1.  If there is now current heart disease and less than two risk factors, TOTAL CHOLESTEROL should be less than 200 and LDL CHOLESTEROL should be less than 100. 2.  If there is current heart disease or two or more risk factors, TOTAL  CHOLESTEROL should be less than 200 and LDL CHOLESTEROL should be less than 70.  A diet low in cholesterol, saturated fat, and calories is the cornerstone of treatment for elevated cholesterol.  Cessation of smoking and exercise are also important in the management of elevated cholesterol and preventing vascular disease.  Studies have shown that 30 to 60 minutes of physical activity most days can help lower blood pressure, lower cholesterol, and keep your weight at a healthy level.  Drug therapy is used when cholesterol levels do not respond to therapeutic lifestyle changes (smoking cessation, diet, and exercise) and remains unacceptably high.  If medication is started, it is important to have you levels checked periodically to evaluate the need for further treatment options.      Thank you,     Sander Nephew, RN for Dr.[facility] [employer] Team

## 2010-06-04 NOTE — Progress Notes (Signed)
Summary: Records Request   Faxed OV, EKG & Labs to [place] at [facility] ([phone]). Debby Freiberg  April 17, 2010 11:41 AM

## 2010-06-04 NOTE — Cardiovascular Report (Signed)
Summary: [employer]  [employer]   Imported By: Marylou Mccoy 05/20/2010 13:56:46  _____________________________________________________________________  External Attachment:    Type:   Image     Comment:   External Document

## 2010-06-04 NOTE — Letter (Signed)
Summary: Work Writer, Main Office  [address]   [place], [place] [address]   Phone: [phone]  Fax: [phone]     May 06, 2010    Kaitlin [name]   The above named patient had a medical visit today with Dr. Ladona Ridgel today.  Please take this into consideration when reviewing the time away from work.      Sincerely yours,   Anselm Pancoast

## 2010-06-04 NOTE — Assessment & Plan Note (Signed)
Summary: VT--ATP'd passed out for 10 sec per daughter/kl  Medications Added FISH OIL 1000 MG CAPS (OMEGA-3 FATTY ACIDS) Take 1 capsule by mouth once a day MULTIVITAMINS  TABS (MULTIPLE VITAMIN) Take 1 tablet by mouth once a day ASPIRIN 81 MG TBEC (ASPIRIN) Take one tablet by mouth daily MELOXICAM 7.5 MG TABS (MELOXICAM) Take 1 tablet by mouth once a day LEXAPRO 10 MG TABS (ESCITALOPRAM OXALATE) Take 1 tablet by mouth once a day      Allergies Added: NKDA  Visit Type:  Device check Primary Provider:  Mirna Mires, MD  CC:  Patient passed out for 10 seconds last Sunday.  History of Present Illness: Kaitlin [name] returns today for followup.  She had a syncopal episode several days ago.  Her daughter were with her when she slumped over at the kitchen table and passed out momentarily.  Within10 seconds she was awake and felt well. No ICD shocks.  Current Medications (verified): 1)  Benazepril Hcl 10 Mg Tabs (Benazepril Hcl) .... 1 and 1/2 Tablet Daily 2)  Carvedilol 25 Mg Tabs (Carvedilol) .... One Bid 3)  Simvastatin 40 Mg Tabs (Simvastatin) .... One By Mouth Daily 4)  Fish Oil 1000 Mg Caps (Omega-3 Fatty Acids) .... Take 1 Capsule By Mouth Once A Day 5)  Multivitamins  Tabs (Multiple Vitamin) .... Take 1 Tablet By Mouth Once A Day 6)  Aspirin 81 Mg Tbec (Aspirin) .... Take One Tablet By Mouth Daily 7)  Tylenol 325 Mg Tabs (Acetaminophen) .... As Needed 8)  Lasix 40 Mg Tabs (Furosemide) .... One Tablet Daily or As Directed 9)  Potassium Chloride Crys Cr 20 Meq Cr-Tabs (Potassium Chloride Crys Cr) .... One Daily 10)  Ambien 5 Mg Tabs (Zolpidem Tartrate) .... One By Mouth At Bedtime As Needed 11)  Meloxicam 7.5 Mg Tabs (Meloxicam) .... Take 1 Tablet By Mouth Once A Day 12)  Lexapro 10 Mg Tabs (Escitalopram Oxalate) .... 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  The patient denies chest pain, dyspnea on exertion, and peripheral edema.    Vital Signs:  Patient profile:   64 year old female Height:      64 inches Weight:      180.75 pounds BMI:     31.14 Pulse rate:   72 / minute Pulse rhythm:   irregular Resp:     18  per minute BP sitting:   120 / 84  (left arm) Cuff size:   large  Vitals Entered By: Vikki Ports (May 06, 2010 9:49 AM)  Physical Exam  General:  Well developed, well nourished, in no acute distress. Head:  normocephalic and atraumatic Eyes:  PERRLA/EOM intact; conjunctiva and lids normal. Mouth:  Teeth, gums and palate normal. Oral mucosa normal. Neck:  Neck supple, no JVD. No masses, thyromegaly or abnormal cervical nodes. Chest Wall:  Well-healed ICD pocket Lungs:  Clear bilaterally to auscultation with no wheezes, rales, or rhonchi.. Heart:  RRR with normal S1 and S2. No murmurs. Abdomen:  Bowel sounds positive; abdomen soft and non-tender without masses, organomegaly, or hernias noted. No hepatosplenomegaly. Msk:  Back normal, normal gait. Muscle strength and tone normal. Pulses:  normal pedal pulses bilaterally.   Extremities:  No clubbing or cyanosis. Neurologic:  Alert and oriented x 3.    ICD Specifications Following MD:  Lewayne Bunting, MD     ICD Vendor:  [employer]     ICD Model Number:  540-VRT     ICD Serial Number:  [id number] ICD DOI:  02/24/2009     ICD Implanting MD:  Lewayne Bunting, MD  Lead 1:    Location: RV     DOI: 02/24/2009     Model #: [id number]     Serial #: [id number]     Status: active  Indications::  VT   ICD Follow Up ICD Dependent:  No       Episodes Coumadin:  No  Brady Parameters Mode VVI     Lower Rate Limit:  45      Tachy Zones VF:  240     VT:  207     VT1:  176     MD Comments:  Her device is working normally.  Her VT was successfully pace terminated.  Impression & Recommendations:  Problem # 1:  AUTOMATIC IMPLANTABLE CARDIAC DEFIBRILLATOR SITU (ICD-V45.02) Her device is working normally.  I have asked that she not drive.  Will check her back in several months.  Problem # 2:  VENTRICULAR TACHYCARDIA (ICD-427.1) Her symptoms are well controlled. I will hold off on any anti-arrhythmic meds. Her updated medication list for this problem includes:    Benazepril Hcl 10 Mg Tabs (Benazepril hcl) .[name]... 1 and 1/2 tablet daily    Carvedilol 25 Mg Tabs (Carvedilol) ..... One bid    Aspirin 81 Mg Tbec (Aspirin) .[name]... Take one tablet by mouth daily  Problem # 3:  CHRONIC SYSTOLIC HEART FAILURE (ICD-428.22) Her CHF symptoms are well controlled. Continue meds as below.  She does admit to missing an occaisional med.  I have asked her to work hard on maintaining a low sodium diet. Her updated medication list for this problem includes:    Benazepril Hcl 10 Mg Tabs (Benazepril hcl) .[name]... 1 and 1/2 tablet daily    Carvedilol 25 Mg Tabs (Carvedilol) ..... One bid    Aspirin 81 Mg Tbec (Aspirin) .[name]... Take one tablet by mouth daily    Lasix 40 Mg Tabs (Furosemide) ..... One tablet daily or as directed  Patient Instructions: 1)  Your physician wants you to follow-up in:   6 months with Dr Court Joy will receive a reminder letter in the mail two months in advance. If you don't receive a letter, please call our office to schedule the follow-up appointment.

## 2010-06-10 NOTE — Progress Notes (Signed)
Summary: question on meds/device  Phone Note Call from Patient Call back at Home Phone [phone] Call back at [phone]   Caller: Daughter Reason for Call: Talk to Nurse Summary of Call: pt daughter has question re meds. pt has been vomiting. pt daughter has question re device reading. Initial call taken by: Roe Coombs,  May 21, 2010 10:24 AM  Follow-up for Phone Call        spoke with pt's daughter  I am waiting on EKG  Called her and lert her know did not recieve Dennis Bast, RN, BSN  May 21, 2010 5:46 PM Called daughter and left message her know I have still not recieved EKG and too call me back tomorrow  Just checking on her mom Dennis Bast, RN, BSN  May 25, 2010 5:43 PM  left message with daughter in regards to EKG  Dr Ladona Ridgel not in until next Evonnie Pat, RN, BSN  May 29, 2010 3:10 PM  Additional Follow-up for Phone Call Additional follow up Details #1::        Rec'd EKG and called daughter [facility] that her EKG did not show afib  the comp read it as afib however Dr Ladona Ridgel looked and said it's not afib.  I also let her know if she has any problems or questions she can call us back. Dennis Bast, RN, BSN  June 05, 2010 11:41 AM

## 2010-07-07 ENCOUNTER — Telehealth (INDEPENDENT_AMBULATORY_CARE_PROVIDER_SITE_OTHER): Payer: Self-pay | Admitting: *Deleted

## 2010-07-07 ENCOUNTER — Telehealth: Payer: Self-pay | Admitting: Internal Medicine

## 2010-07-14 NOTE — Progress Notes (Signed)
Summary: rx refill of all pt meds  Phone Note Refill Request Call back at Home Phone [phone] Message from:  Patient on July 07, 2010 12:57 PM  pt would like all of her meds to be called in. pt would like to have a call when rx is called in to the pharmacy.   Method Requested: Telephone to Pharmacy Initial call taken by: Roe Coombs,  July 07, 2010 12:59 PM  Follow-up for Phone Call        [facility] regarding meds needing called in. Need to know which pharmacy. Celestia Khat, [place]  July 07, 2010 2:48 PM  pt calling back-[facility] pharmacy Glynda Jaeger  July 07, 2010 3:57 PM Follow-up by: Celestia Khat, CMA,  July 07, 2010 2:48 PM    Prescriptions: POTASSIUM CHLORIDE CRYS CR 20 MEQ CR-TABS (POTASSIUM CHLORIDE CRYS CR) one daily  #90 x 3   Entered by:   Celestia Khat, CMA   Authorized by:   Laren Boom, MD, [facility]   Signed by:   Celestia Khat, CMA on 07/07/2010   Method used:   Faxed to ...       [facility] Environmental education officer)       [address] [address]       [place], [place]  [address]       Ph: [phone]       Fax: [phone]   RxID:   [id number] LASIX 40 MG TABS (FUROSEMIDE) one tablet daily or as directed  #90 x 3   Entered by:   Celestia Khat, CMA   Authorized by:   Laren Boom, MD, [facility]   Signed by:   Celestia Khat, CMA on 07/07/2010   Method used:   Faxed to ...       [facility] Environmental education officer)       [address] [address]       [place], [place]  [address]       Ph: [phone]       Fax: [phone]   RxID:   [id number] SIMVASTATIN 40 MG TABS (SIMVASTATIN) one by mouth daily  #90 x 3   Entered by:   Celestia Khat, CMA   Authorized by:   Laren Boom, MD, [facility]   Signed by:   Celestia Khat, CMA on 07/07/2010   Method used:   Faxed to ...       [facility] Environmental education officer)       [address] [address]       [place], [place]  [address]       Ph: [phone]       Fax: [phone]   RxID:   [id number] CARVEDILOL 25 MG TABS  (CARVEDILOL) one bid  #180 x 3   Entered by:   Celestia Khat, CMA   Authorized by:   Laren Boom, MD, [facility]   Signed by:   Celestia Khat, CMA on 07/07/2010   Method used:   Faxed to ...       [facility] Environmental education officer)       [address] [address]       [place], [place]  [address]       Ph: [phone]       Fax: [phone]   RxID:   [id number] BENAZEPRIL HCL 10 MG TABS (BENAZEPRIL HCL) 1 and 1/2 tablet daily  #135 x 3   Entered by:   Celestia Khat, CMA   Authorized by:   Laren Boom, MD, [facility]   Signed by:  [place], [place] on 07/07/2010   Method used:   Faxed to ...       [facility] Environmental education officer)       [address] [address]       [place], [place]  [address]       Ph: [phone]       Fax: [phone]   RxID:   [id number]

## 2010-07-14 NOTE — Progress Notes (Signed)
       Additional Follow-up for Phone Call Additional follow up Details #2::    called pt and informed her all cardiac med refills have been faxed to [facility]. Celestia Khat, [place]  July 07, 2010 4:26 PM  Follow-up by: Celestia Khat, CMA,  July 07, 2010 4:26 PM

## 2010-07-22 ENCOUNTER — Encounter (INDEPENDENT_AMBULATORY_CARE_PROVIDER_SITE_OTHER): Payer: Self-pay | Admitting: *Deleted

## 2010-07-30 NOTE — Letter (Signed)
Summary: Appointment - Reschedule  [facility], Main Office  [address]   [place], [place] [address]   Phone: [phone]  Fax: [phone]     July 22, 2010 MRN: [id number]   [facility] [address] [place], [place]  [address]   Dear Ms. Pinkham,   Due to a change in our office schedule, your appointment on 08-06-10  at  12:00 pm              must be changed.  It is very important that we reach you to reschedule this appointment. We look forward to participating in your health care needs. Please contact us at the number listed above at your earliest convenience to reschedule this appointment.     Sincerely,  Glass blower/designer

## 2010-08-06 ENCOUNTER — Encounter: Payer: Self-pay | Admitting: *Deleted

## 2010-08-06 LAB — BASIC METABOLIC PANEL
BUN: 15 mg/dL (ref 6–23)
BUN: 20 mg/dL (ref 6–23)
CO2: 23 mEq/L (ref 19–32)
CO2: 24 mEq/L (ref 19–32)
Calcium: 9.5 mg/dL (ref 8.4–10.5)
Calcium: 9.7 mg/dL (ref 8.4–10.5)
Chloride: 104 mEq/L (ref 96–112)
Chloride: 107 mEq/L (ref 96–112)
Creatinine, Ser: 1.21 mg/dL — ABNORMAL HIGH (ref 0.4–1.2)
Creatinine, Ser: 1.22 mg/dL — ABNORMAL HIGH (ref 0.4–1.2)
GFR calc Af Amer: 54 mL/min — ABNORMAL LOW (ref >60)
GFR calc Af Amer: 55 mL/min — ABNORMAL LOW (ref >60)
GFR calc non Af Amer: 44 mL/min — ABNORMAL LOW (ref >60)
GFR calc non Af Amer: 45 mL/min — ABNORMAL LOW (ref >60)
Glucose, Bld: 104 mg/dL — ABNORMAL HIGH (ref 70–99)
Potassium: 3.9 mEq/L (ref 3.5–5.1)
Potassium: 3.9 mEq/L (ref 3.5–5.1)
Sodium: 140 mEq/L (ref 135–145)
Sodium: 140 mEq/L (ref 135–145)

## 2010-08-06 LAB — CK TOTAL AND CKMB (NOT AT ARMC)
CK, MB: 5.2 ng/mL — ABNORMAL HIGH (ref 0.3–4.0)
Relative Index: 4.7 — ABNORMAL HIGH (ref 0.0–2.5)
Total CK: 111 U/L (ref 7–177)
Total CK: 70 U/L (ref 7–177)

## 2010-08-06 LAB — POCT I-STAT 3, VENOUS BLOOD GAS (G3P V)
O2 Saturation: 66 %
TCO2: 26 mmol/L (ref 0–100)
pCO2, Ven: 42.2 mmHg — ABNORMAL LOW (ref 45.0–50.0)
pO2, Ven: 35 mmHg (ref 30.0–45.0)

## 2010-08-06 LAB — DIFFERENTIAL
Basophils Absolute: 0 10*3/uL (ref 0.0–0.1)
Basophils Relative: 0 % (ref 0–1)
Eosinophils Absolute: 0.1 10*3/uL (ref 0.0–0.7)
Eosinophils Relative: 0 % (ref 0–5)
Monocytes Absolute: 0.9 10*3/uL (ref 0.1–1.0)

## 2010-08-06 LAB — CBC
HCT: 34.5 % — ABNORMAL LOW (ref 36.0–46.0)
HCT: 41.2 % (ref 36.0–46.0)
Hemoglobin: 11.7 g/dL — ABNORMAL LOW (ref 12.0–15.0)
Hemoglobin: 12.2 g/dL (ref 12.0–15.0)
Hemoglobin: 12.9 g/dL (ref 12.0–15.0)
Hemoglobin: 14 g/dL (ref 12.0–15.0)
MCHC: 33.9 g/dL (ref 30.0–36.0)
MCHC: 34 g/dL (ref 30.0–36.0)
MCHC: 34.2 g/dL (ref 30.0–36.0)
MCV: 92.3 fL (ref 78.0–100.0)
MCV: 93.1 fL (ref 78.0–100.0)
Platelets: 161 10*3/uL (ref 150–400)
RBC: 3.7 MIL/uL — ABNORMAL LOW (ref 3.87–5.11)
RBC: 3.86 MIL/uL — ABNORMAL LOW (ref 3.87–5.11)
RBC: 4.05 MIL/uL (ref 3.87–5.11)
RBC: 4.19 MIL/uL (ref 3.87–5.11)
RDW: 13.2 % (ref 11.5–15.5)
RDW: 13.4 % (ref 11.5–15.5)
WBC: 10.3 10*3/uL (ref 4.0–10.5)
WBC: 10.5 10*3/uL (ref 4.0–10.5)
WBC: 8.8 10*3/uL (ref 4.0–10.5)
WBC: 9.4 10*3/uL (ref 4.0–10.5)
WBC: 9.4 10*3/uL (ref 4.0–10.5)

## 2010-08-06 LAB — BRAIN NATRIURETIC PEPTIDE: Pro B Natriuretic peptide (BNP): 176 pg/mL — ABNORMAL HIGH (ref 0.0–100.0)

## 2010-08-06 LAB — POCT I-STAT, CHEM 8
BUN: 24 mg/dL — ABNORMAL HIGH (ref 6–23)
Calcium, Ion: 0.93 mmol/L — ABNORMAL LOW (ref 1.12–1.32)
Chloride: 111 mEq/L (ref 96–112)
Glucose, Bld: 170 mg/dL — ABNORMAL HIGH (ref 70–99)

## 2010-08-06 LAB — HEPARIN LEVEL (UNFRACTIONATED)
Heparin Unfractionated: 0.52 IU/mL (ref 0.30–0.70)
Heparin Unfractionated: 0.6 IU/mL (ref 0.30–0.70)
Heparin Unfractionated: 1.11 IU/mL — ABNORMAL HIGH (ref 0.30–0.70)
Heparin Unfractionated: <0.1 IU/mL — ABNORMAL LOW (ref 0.30–0.70)
Heparin Unfractionated: <0.1 IU/mL — ABNORMAL LOW (ref 0.30–0.70)

## 2010-08-06 LAB — POCT I-STAT 3, ART BLOOD GAS (G3+)
TCO2: 26 mmol/L (ref 0–100)
pCO2 arterial: 36.6 mmHg (ref 35.0–45.0)
pH, Arterial: 7.434 — ABNORMAL HIGH (ref 7.350–7.400)
pO2, Arterial: 73 mmHg — ABNORMAL LOW (ref 80.0–100.0)

## 2010-08-06 LAB — TSH: TSH: 0.854 u[IU]/mL (ref 0.350–4.500)

## 2010-08-06 LAB — APTT: aPTT: 23 seconds — ABNORMAL LOW (ref 24–37)

## 2010-08-06 LAB — PROTIME-INR: Prothrombin Time: 13.2 seconds (ref 11.6–15.2)

## 2010-08-06 LAB — TROPONIN I

## 2010-08-06 LAB — POCT CARDIAC MARKERS: Myoglobin, poc: 174 ng/mL (ref 12–200)

## 2010-08-06 LAB — D-DIMER, QUANTITATIVE

## 2010-08-17 ENCOUNTER — Ambulatory Visit (INDEPENDENT_AMBULATORY_CARE_PROVIDER_SITE_OTHER): Payer: Commercial Managed Care - PPO | Admitting: *Deleted

## 2010-08-17 ENCOUNTER — Encounter: Payer: Self-pay | Admitting: Internal Medicine

## 2010-08-17 DIAGNOSIS — I472 Ventricular tachycardia: Secondary | ICD-10-CM

## 2010-08-17 DIAGNOSIS — I428 Other cardiomyopathies: Secondary | ICD-10-CM

## 2010-08-17 DIAGNOSIS — Z95810 Presence of automatic (implantable) cardiac defibrillator: Secondary | ICD-10-CM

## 2010-08-17 NOTE — Progress Notes (Signed)
icd check in clinic  

## 2010-09-15 NOTE — Cardiovascular Report (Signed)
Kaitlin [name], Kaitlin [name] NO.:  [id number]   MEDICAL RECORD NO.:  [id number]          PATIENT TYPE:  OIB   LOCATION:  2899                         FACILITY:  [facility]   PHYSICIAN:  Rollene Rotunda, MD, FACCDATE OF BIRTH:  1946/02/28   DATE OF PROCEDURE:  08/04/2007  DATE OF DISCHARGE:                            CARDIAC CATHETERIZATION   PRIMARY CARE PHYSICIAN:  Annia Friendly. Hill, MD.   REASON FOR PRESENTATION:  A patient with a cardiomyopathy of unclear  etiology.  The patient also had very-difficult-to-control hypertension.  An angiogram was done to rule out renal artery stenosis.   PROCEDURE NOTE:  Left heart catheterization was performed and the right  femoral artery was cannulated using an anterior wall puncture.  A #6-  French arterial sheath was inserted via the modified Seldinger  technique.  A preformed Judkins and pigtail catheter were utilized.  The  patient tolerated the procedure well and left the lab in stable  condition.   RESULTS OF HEMODYNAMICS:  LV 139/90, AO 130/71.   CORONARIES:  The left main was short and normal.  The LAD had proximal  luminal irregularities.  The first diagonal was moderate size normal.  The circumflex in the AV groove had proximal luminal irregularities.  The vessel essentially ended as a very large mid obtuse marginal which  was branching and normal.  The right coronary artery is a large dominant  vessel.  There was mid long 25% stenosis.  The PDA was large and normal.  The posterolateral was small and normal.   LEFT VENTRICULOGRAM:  The left ventriculogram was obtained in the RAO  projection.  The EF was approximately 60% with no regional wall motion  abnormalities.   AORTOGRAM:  A distal aortogram was obtained.  The renal artery was  widely patent.  There was no aneurysm.  There was no aortic or iliac  plaquing.   CONCLUSION:  Mild coronary artery plaque.  Normal aortogram and renal  arteries.  The patient's  previously-reduced ejection fraction has  improved dramatically with control of her blood pressure.   PLAN:  The patient will continue to have medical management of her blood  pressure and previous nonischemic cardiomyopathy.      Rollene Rotunda, MD, [facility]  Electronically Signed    JH/MEDQ  D:  08/04/2007  T:  08/04/2007  Job:  [id number]   cc:   Annia Friendly. Loleta Chance, MD

## 2010-09-15 NOTE — H&P (Signed)
NAMESHANIKQUA, ZARZYCKI NO.:  [id number]   MEDICAL RECORD NO.:  [id number]          PATIENT TYPE:  EMS   LOCATION:  ED                           FACILITY:  [facility]   PHYSICIAN:  Michaelyn Barter, M.D. DATE OF BIRTH:  April 20, 1946   DATE OF ADMISSION:  05/12/2007  DATE OF DISCHARGE:                              HISTORY & PHYSICAL   PRIMARY CARE DOCTOR:  Renaye Rakers, M.D.   CHIEF COMPLAINT:  Shortness of breath.   HISTORY OF PRESENT ILLNESS:  Ms. Garciagarcia is a 65 year old female with a  past medical history of hypertension who states that at approximately 10  o'clock p.m. she was lying down in bed.  She developed shortness of  breath.  She indicates that she has never had similar episodes of  shortness of breath before.  She denies having any chest pain.  She  states that it felt as if someone were choking her.  Her shortness of  breath occurred off and on throughout the course of the day,  particularly with activity.  She typically only uses 1 pillow to sleep  on at baseline, however.   PAST MEDICAL HISTORY:  Hypertension.   PAST SURGICAL HISTORY:  None.   ALLERGIES:  NO KNOWN DRUG ALLERGIES.   HOME MEDICATIONS:  No medications.   SOCIAL HISTORY:  Cigarettes:  The patient smokes occasionally.  Alcohol  occasionally.  Cocaine never.   FAMILY HISTORY:  Mother had emphysema and heart disease.  Father died  from an MI at the age of 97.  Brother had emphysema.   REVIEW OF SYSTEMS:  As per HPI, otherwise all other systems are  negative.   PHYSICAL EXAMINATION:  GENERAL:  The patient is awake.  She is  cooperative.  She is in no  obvious distress.  VITAL SIGNS:  Blood pressure is 221/138, heart rate 148, respirations  24, O2 sat 95%.  HEENT:  Normocephalic, atraumatic.  Anicteric.  Extraocular movements  are intact.  Oral mucosa is pink.  No thrush.  NECK:  Positive JVD up to the angle of the mandible.  Supple.  No  lymphadenopathy, no thyromegaly.  CARDIAC:   S1-S2 present.  Regular rate and rhythm.  RESPIRATORY:  No  crackles or wheezes.  ABDOMEN:  Flat, soft, nontender, nondistended.  Positive bowel sounds.  EXTREMITIES:  No leg edema.  NEUROLOGIC:  The patient is alert and oriented x3.  MUSCULOSKELETAL:  There is 5/5 upper and lower extremity strength.   BNP 257.  CK-MB, POC 1.6, troponin I, POC less than 0.05.  Sodium 136,  potassium 3.8, chloride 106, CO2 21, glucose 138, potassium 12,  creatinine 0.99, calcium 9.4.  WBCs 11, hemoglobin 13.8, hematocrit  40.5, platelet count 321.  EKG reveals sinus tachycardia, a Q wave in  lead V1.  No ST-segment abnormalities.  Chest x-ray reveals cardiomegaly  with interstitial edema and bibasilar atelectasis.   ASSESSMENT/PLAN:  1. Hypertensive crisis.  We will start the patient on antihypertensive      medication.  We will titrate the medication to achieve optimal      control of the patient's blood pressure.  We  will also check a      urine drug screen.  2. Pulmonary edema/new onset congestive heart failure.  This was  most      likely triggered by the patient's significantly elevated blood      pressure.  We will check the patient's cardiac markers, including      troponin I plus CK-MB x3 q.8h. apart.  We will also order a 2-D      echocardiogram.  3. Gastrointestinal prophylaxis.  We will provide Protonix.  4. Deep venous thrombosis.  We will provide Lovenox.      Michaelyn Barter, M.D.  Electronically Signed     OR/MEDQ  D:  05/13/2007  T:  05/13/2007  Job:  [id number]   cc:   Renaye Rakers, M.D.  Fax: [phone]

## 2010-09-15 NOTE — Assessment & Plan Note (Signed)
[facility]                            CARDIOLOGY OFFICE NOTE   NAME:Kaitlin [name]                 MRN:          [id number]  DATE:01/01/2008                            DOB:          04/21/1946    PRIMARY CARE PHYSICIAN:  Kaitlin [name]   REASON FOR PRESENTATION:  The patient with cardiomyopathy and  hypertension.   HISTORY OF PRESENT ILLNESS:  The patient is 65 year old.  She has done  well since I last saw her.  She has had no new shortness of breath.  She  denies any PND or orthopnea.  She had no palpitations, presyncope, or  syncope.  She had no chest discomfort, neck, or arm discomfort.  She has  had a little lower extremity swelling, which she notices in particular  if she is out and walking quite a bit.  It is not as bad during the day  or when she keeps her feet up at night.   PAST MEDICAL HISTORY:  Cardiomyopathy (EF was 40-45% by echo, but higher  than this by cath), probable mild diastolic dysfunction with left  ventricular hypertrophy, hypertension, and dyslipidemia.   ALLERGIES:  None.   MEDICATIONS:  1. Amlodipine 10 mg daily.  2. Simvastatin 40 mg daily.  3. Aspirin 81 mg daily.  4. Fish oil.  5. Benazepril 40 mg daily.  6. Carvedilol 12.5 mg t.i.d.   REVIEW OF SYSTEMS:  As stated in the HPI, and otherwise negative other  systems.   PHYSICAL EXAMINATION:  GENERAL:  The patient is in no distress.  VITAL SIGNS:  Blood pressure 145/89, heart rate 81 and regular.  HEENT:  Eyelids unremarkable; pupils equal, round, and reactive to  light; fundi not visualized; and oral mucosa normal.  NECK:  No jugular distention at 45 degrees; carotid upstroke brisk and  symmetrical; no bruits and no thyromegaly.  LYMPHATICS:  No cervical, axillary, or inguinal adenopathy.  LUNGS:  Clear to auscultation bilaterally.  BACK:  No costovertebral tenderness.  CHEST:  Unremarkable.  HEART:  PMI not displaced or sustained; S1 and S2  within normal limits;  no S3 and S4, no clicks, no rubs, and no murmurs.  ABDOMEN:  Obese;  positive bowel sounds; normal in frequency and pitch; no bruits, no  rebound, no guarding, no midline pulsatile mass, no hepatomegaly, and no  splenomegaly.  SKIN:  No rashes, nodules.  EXTREMITIES:  A 2+ pulses throughout, trace ankle edema; no cyanosis and  no clubbing.  NEUROLOGIC:  Oriented to person, place, and time; cranial nerves II  through XII are grossly intact.  Motor is intact.   EKG sinus rhythm, mild interventricular conduction delay, nonspecific;  nonspecific inferolateral T-wave inversions probable repolarization  changes.   ASSESSMENT AND PLAN:  1. Diastolic heart failure.  The patient is euvolemic.  We talked      about this diagnosis quite a bit.  She may have some slight      systolic dysfunction though the echo and cath ejection fractions      are discrepant.  At this point, the plan is salt and fluid  restriction and management of hypertension.  2. Hypertension.  Blood pressure is elevated.  We discussed the      importance of managing this given her problems already.  We      discussed salt restriction.  She is going to get blood pressure      cuff and keep a diary.  We will make titrations through the meds      based on these readings.  3. Dyslipidemia per her primary care doctor with a goal LDL less than      100, HDL greater than 50.  4. Followup, I will see her back in 6 months or sooner if needed.     Rollene Rotunda, [name], [facility]  Electronically Signed    JH/MedQ  DD: 01/01/2008  DT: 01/02/2008  Job #: [id number]   cc:   Kaitlin [name]

## 2010-09-15 NOTE — Assessment & Plan Note (Signed)
[facility]                            CARDIOLOGY OFFICE NOTE   NAME:Kaitlin [name], Kaitlin [name]                 MRN:          [id number]  DATE:07/06/2007                            DOB:          07-15-1945    REFERRING PHYSICIAN:  Annia Friendly. Hill, MD   REASON FOR CONSULTATION:  Evaluate patient with tachycardia and  cardiomyopathy.   HISTORY OF PRESENT ILLNESS:  The patient is a lovely 65 year old African  female who looks younger than her stated age.  She has a long history of  hypertension and did not have it treated for awhile as she did have  insurance.  In January, she was admitted to the hospital with acute  shortness of breath and hypertensive urgency.  Her blood pressure was  managed.  She apparently did have some edema on chest x-ray.  Echocardiogram demonstrated a reduced ejection fraction with an EF of 40-  45%.  There was some question of diastolic dysfunction.  There were some  regional wall motion abnormalities with questionable akinesis of the  basal and mid anteroseptal wall.   The patient has been followed by Dr. Loleta Chance.  Her blood pressure has been  managed.  However, she has had tachycardia sinus tachycardia.  She has  heart rates in the 110s at rest.  She is not particularly active though  she does not notice any increased palpitations with such activities as  vacuuming or climbing stairs.  She no longer has dyspnea that prompted  the hospitalization.  She denies any resting shortness breath, PND or  orthopnea.  Again, she does not push herself in her activities.  She  does not have any and never has had any chest pressure, neck discomfort,  arm discomfort, activity induced nausea, excessive diaphoresis.  She is  referred by Dr. Loleta Chance for evaluation of her tachycardia.   PAST MEDICAL HISTORY:  Hypertension, mildly elevated liver enzymes at  time of hospitalization, dyslipidemia, mildly reduced ejection fraction  as described.   PAST  SURGICAL HISTORY:  None.   ALLERGIES:  None.   MEDICATIONS:  1. Caduet 10/20 daily.  2. Diovan 80 mg daily.  3. Aspirin 81 mg daily.  4. Fish oil 1000 mg b.i.d.   SOCIAL HISTORY:  The patient works as a Contractor at  [employer].  She is single.  She has 2 children and 3 grandchildren.  She  does not smoke cigarettes and never has.  She drinks alcohol socially.   FAMILY HISTORY:  Contributory for her mother having heart failure and  dying at age 19.  She had long-standing high blood pressure.  Father had  heart disease starting in his 32s with bypass.  Died at age 51 of  myocardial infarction.   REVIEW OF SYSTEMS:  As stated in the HPI and positive for occasional  headaches and dizziness, occasional cough and wheezing.  Negative for  all other systems.   PHYSICAL EXAMINATION:  The patient is well-appearing in no distress.  Blood pressure 136/90, heart rate 117 and regular, weight 162 pounds,  body mass index 28.  HEENT:  Eyelids unremarkable.  Pupils are equal,  round, and reactive to  light.  Fundi within normal limits.  Oral mucosa unremarkable.  NECK:  No jugular venous distension 45 degrees, carotid upstroke brisk  and symmetric, no bruits, positive diffuse thyromegaly without nodules.  LYMPHATICS:  No cervical, axillary, or inguinal adenopathy.  LUNGS:  Clear to auscultation bilaterally.  BACK:  No costovertebral angle tenderness.  CHEST:  Unremarkable.  HEART:  PMI not displaced or sustained, S1 and S2 within normal limits,  no S3, no S4, no clicks, rubs, murmurs.  ABDOMEN:  Flat, positive bowel sounds, normal in frequency and pitch, no  bruits, rebound, guarding.  No midline pulsatile masses, hepatomegaly,  splenomegaly.  SKIN:  No rashes, no nodules.  EXTREMITIES:  With 2+ pulses throughout, no edema, cyanosis, clubbing.  NEURO:  Oriented to person, place, and time, cranial nerves 2-12 grossly  intact, motor grossly intact.    EKG sinus tachycardia,  rate 117, premature ventricular contractions,  left ventricle hypertrophy with repolarization changes.   ASSESSMENT/PLAN:  1. Tachycardia.  The patient has sinus tachycardia.  I do not know if      this is a secondary problem related to her cardiomyopathy or      perhaps a primary issue.  She does have a goiter and her thyroid      has recently been checked.  I talked with Dr. Loleta Chance to see what the      workup of this will be and whether we can be sure that she is not      hyperthyroid, as this would need to be treated first.  Otherwise I      am going to manage this in the context of treating her      cardiomyopathy and her blood pressure.  2. Cardiomyopathy.  The patient's cardiomyopathy is most likely      related to hypertension.  However, she has a regional wall motion      abnormality on echo.  She has a strong family history of coronary      disease.  This will have to be excluded on cardiac catheterization.      In the meantime, I am going to adjust her meds slightly to manage      her cardiomyopathy.  She is having trouble affording medicines and      so I am going to switch her to complete generics.  She is going to      be on amlodipine 10 mg daily.  Simvastatin 40 mg daily.  This will      replace the Caduet.  I am going to switch from Diovan to benazepril      and start at 20 mg.  I am also been initiated low-dose beta      blocker, carvedilol 3.125 mg b.i.d. which we will titrate.  Will      continue to titrate her meds slowly over time.  3. Hypertension.  As above, this is being managed in the context of      treating her tachycardia and her cardiomyopathy.  4. Dyslipidemia, per Dr. Loleta Chance.  I am substituting Lipitor 20 for      simvastatin 40 again for cost.  5. Followup.  I would like to see her back in a couple of weeks, but      probably will arrange catheterization before then.     Rollene Rotunda, MD, [facility]  Electronically Signed    JH/MedQ  DD: 07/06/2007  DT:  07/06/2007  Job #: [id number]  cc:   Annia Friendly. Loleta Chance, MD

## 2010-09-15 NOTE — Assessment & Plan Note (Signed)
[facility]                            CARDIOLOGY OFFICE NOTE   NAME:[name], [name]                 MRN:          [id number]  DATE:07/20/2007                            DOB:          10/09/1945    HISTORY OF PRESENT ILLNESS:  The patient is 65 years old.  She presents  for evaluation of her cardiomyopathy and hypertension.  She has done  well on the medication changes that have included switching to  benazepril and carvedilol.  She says she is sleeping well.  She is not  having any shortness of breath.  She denies any PND or orthopnea.  She  is not noticing her heart racing.  She has had no presyncope or syncope.  She had no chest pressure.   I did speak with Dr. Loleta Chance.  He reported that her TSH and thyroid studies  were normal.  He apparently is going to further evaluate her goiter.   PAST MEDICAL HISTORY:  1. Cardiomyopathy (EF 40-45% with questionable diastolic dysfunction      and questionable akinesis of the basal and mid anteroseptal wall).  2. Mildly elevated liver enzymes at the time of recent hospitalization      for heart failure.  3. Dyslipidemia.   ALLERGIES:  No known drug allergies.   MEDICATIONS:  1. Benazepril 20 mg daily.  2. Carvedilol 3.125 mg b.i.d.  3. Amlodipine 10 mg daily.  4. Simvastatin 40 mg nightly.  5. Aspirin 81 mg daily.  6. Fish oil b.i.d.   REVIEW OF SYSTEMS:  As stated in the HPI and otherwise negative for  other systems.   PHYSICAL EXAMINATION:  GENERAL:  The patient is in no distress.  VITAL SIGNS:  Blood pressure 152/99, heart rate 115 and regular, weight  162 pounds, body mass index 28.  NECK:  No jugular venous distention at 45 degrees.  Carotid upstroke  brisk and symmetrical.  No bruits, thyromegaly.  LYMPHATICS:  No cervical, axillary, inguinal adenopathy.  LUNGS:  Clear to auscultation bilaterally.  BACK:  No costovertebral history.  CHEST:  Unremarkable.  HEART:  PMI not displaced or  sustained, S1 and S2 within normal.  No S3,  no S4, no clicks, rubs or murmurs.  ABDOMEN:  Flat, positive bowel sounds, normal in frequency and pitch, no  bruits, rebound, guarding or midline pulsatile mass.  No  hepatosplenomegaly or splenomegaly.  SKIN:  No rashes, no nodules.  EXTREMITIES:  2+ pulses, no edema, cyanosis or clubbing.  NEUROLOGIC:  Oriented to person, place and time.  Cranial nerves II-XII  grossly intact.  Motor grossly intact.   ASSESSMENT/PLAN:  1. Cardiomyopathy.  Today, I am going to titrate her medications with      her benazepril increasing to 40 mg daily and her carvedilol to 6.25      mg b.i.d.  She will let me know if she has any symptoms related to      this.  I would like to continue to titrate the beta-blocker and go      down on amlodipine as tolerated.  I am going to evaluate the  etiology of her cardiomyopathy with a cardiac catheterization.  She      is going to go out of town for a little while and then we will      arrange to have this done when she comes back in the [facility].  I      think she has a fairly high pretest probability of obstructive      disease as etiology, given the regional wall motion abnormalities      and family history.  2. Hypertension.  This will be managed in the context of titrating her      heart failure medications.  3. Tachycardia.  Will manage this in the context of titrating her      heart failure medications.  4. Goiter.  This is being followed by Dr. Loleta Chance.  I will try to get his      recent thyroid studies on the chart before any catheterization.  5. Followup.  I will see her at the time of her heart catheterization.     Rollene Rotunda, MD, [facility]  Electronically Signed    JH/MedQ  DD: 07/20/2007  DT: 07/21/2007  Job #: [id number]   cc:   Annia Friendly. Loleta Chance, MD

## 2010-09-15 NOTE — Assessment & Plan Note (Signed)
[facility]                            CARDIOLOGY OFFICE NOTE   NAME:Kaitlin [name], Kaitlin [name]                 MRN:          [id number]  DATE:09/07/2007                            DOB:          January 23, 1946    PRIMARY CARE PHYSICIAN:  Annia Friendly. Hill, MD   REASON FOR PRESENTATION:  Evaluate patient with cardiomyopathy and  hypertension.   HISTORY OF PRESENT ILLNESS:  The patient returns for followup.  She is  65 years old.  She had a mildly reduced ejection fraction about 40% by  echo earlier this year.  I did perform a cardiac catheterization which  demonstrated an EF actually to be 60% by angiogram or by ventriculogram.  There was some mild right coronary artery stenosis, but no other  significant disease.   The patient returns for followup.  She not had any of the dyspnea that  she had when we I first saw her.  She was trying to take her blood  pressure at home, but her blood pressure cuff did not work.  Her blood  pressure does remain slightly elevated in the office.  She is not having  any overt dyspnea, PND or orthopnea.  She has had not had any lower  extremity swelling.  She denies any palpitations, pre-syncope or  syncope.  She had no chest discomfort.   PAST MEDICAL HISTORY:  1. Cardiomyopathy (EF 40-45% by echo, but 60% by cath).  2. Probable mild diastolic dysfunction with left ventricle      hypertrophy.  3. Hypertension.  4. Dyslipidemia.  5. Diastolic heart failure.   ALLERGIES:  None.   MEDICATIONS:  1. Benazepril 40 mg daily.  2. Coreg 6.25 mg b.i.d.  3. Fish oil.  4. Aspirin 81 mg daily.  5. Simvastatin 40 mg daily.  6. Amlodipine 10 mg daily.   REVIEW OF SYSTEMS:  Her review of systems as stated in HPI and otherwise  negative for other systems.   PHYSICAL EXAMINATION:  The patient is in no distress.  Blood pressure  138/90, heart rate 80 and regular, weight 163 pounds, body mass index  28.  HEENT:  Eyelids are  unremarkable, pupils equal, round, reactive to  light, fundi not visualized, oral mucosa unremarkable.  NECK:  No  jugular venous distention at 45 degrees; carotid upstroke brisk and  symmetrical.  No bruits, thyromegaly.  LYMPHATICS:  No lymphadenopathy.  LUNGS:  Clear to auscultation bilaterally.  BACK:  No costovertebral angle tenderness.  CHEST:  Unremarkable.  HEART:  PMI not displaced or sustained, S1-S2 within normal limits. No  S3, no S4, no clicks, rubs, murmurs.  ABDOMEN:  Flat, positive bowel sounds normal in frequency and pitch, no  bruits, rebound, guarding or midline pulsatile mass, no organomegaly.  SKIN:  No rashes. No nodules.  EXTREMITIES:  2+ pulses, edema.   EKG sinus rhythm, rate 80, axis within normal limits, interventricular  conduction delay, lateral T-wave inversions consistent with  repolarization changes.   ASSESSMENT/PLAN:  1. Cardiomyopathy.  I do believe the patient has a mildly reduced      ejection fraction.  She has had hospitalization  for heart failure      symptoms in the past.  At this point, this is diastolic heart      failure predominately and she needs control of her blood pressure      and salt and fluid restriction.  We discussed this at length.  2. Hypertension.  Blood pressure still the upper limits of normal.  I      would like it to be a little bit better controlled.  I am going to      increase her carvedilol to 12.5 mg b.i.d.  I have asked her to      research and find a blood pressure cuff at work so she could keep      it at home.  3. Dyslipidemia, per Dr. Loleta Chance.  4. Followup. Will see the patient back in about 6 weeks or sooner if      needed.     Rollene Rotunda, MD, [facility]  Electronically Signed    JH/MedQ  DD: 09/07/2007  DT: 09/07/2007  Job #: [id number]   cc:   Annia Friendly. Loleta Chance, MD

## 2010-09-15 NOTE — Discharge Summary (Signed)
Kaitlin [name], Kaitlin [name]          ACCOUNT NO.:  [id number]   MEDICAL RECORD NO.:  [id number]          PATIENT TYPE:  INP   LOCATION:  1423                         FACILITY:  [facility]   PHYSICIAN:  Ladell Pier, M.D.   DATE OF BIRTH:  [birth year]   DATE OF ADMISSION:  05/12/2007  DATE OF DISCHARGE:  05/15/2007                               DISCHARGE SUMMARY   DISCHARGE PROBLEM LIST:  1. Shortness of breath secondary to pulmonary edema.  2. Hypertensive crisis.  3. Pulmonary edema on chest x-ray, question new onset congestive heart      failure.  4. Dyslipidemia.  5. Hypokalemia.  6. Mildly elevated liver function tests.   DISCHARGE MEDICATIONS:  1. Aspirin 81 mg daily.  2. Caduet 10/20 daily nightly.  3. Hydrochlorothiazide 25 mg daily.   FOLLOW-UP APPOINTMENTS:  The patient is scheduled to follow up with Dr.  Shana Chute on May 29, 2007, at 4:30 and also with Dr. Parke Simmers in 1 week.   PROCEDURES:  None.   CONSULTANTS:  None.   HISTORY OF PRESENT ILLNESS:  The patient is a 65 year old female, past  medical history significant for hypertension. who states that at 10  o'clock p.m. she was lying down in bed.  She developed shortness of  breath.  She states that she has never had similar episodes of shortness  of breath in the past. She has no chest pain.  She states that it felt  as if someone were choking her. Her shortness of breath occurred on and  off throughout the day.  She typically used only one pillow to sleep  prior to this.  Past medical history, family history, social history,  medications, allergies, review of systems per admission H&P.   PHYSICAL EXAMINATION ON DISCHARGE:  GENERAL:  Temperature 98.1, pulse  87, respirations 16, blood pressure 122/98, pulse oximetry 97% on room  air.  HEENT:  Head normocephalic, atraumatic.  Pupils reactive to light.  Throat without erythema.  CARDIOVASCULAR:  Regular rate and rhythm.  LUNGS:  Clear bilaterally.  ABDOMEN:   Positive bowel sounds.  EXTREMITIES:  No edema.   HOSPITAL COURSE:  #1.  PULMONARY EDEMA, QUESTION OF NEW ONSET CONGESTIVE HEART FAILURE:  The patient probably has hypertensive crisis secondary to severely  elevated blood pressure.  On admission, the patient's blood pressure was  221/138.  The patient was treated with IV Lasix and was started on  antihypertensive medications.  On discharge, her shortness of breath had  resolved, and her blood pressure was lower but not totally controlled.  Discharged her on the above medications to gradually decrease her blood  pressure.  She had a 2-D echocardiogram done prior to discharge to see  if she has new onset CHF secondary to hypertension, but the 2-D  echocardiogram was pending.  Gave her my pager number to call in the  morning for results. If not, she will follow up with Dr. Parke Simmers in a week  and then will get her results. She will also follow up with cardiology  secondary to the mildly elevated troponin which is most likely secondary  to new onset CHF.  She  has seen Dr. Shana Chute in the past.   #2.  HYPERTENSION:  Her blood pressure is better controlled.  Continue  her on this regimen outpatient. Gave her prescriptions.   #3.  HYPOKALEMIA:  Secondary to Lasix.  Replete potassium.   #4.  DYSLIPIDEMIA:  The patient encouraged to eat a healthy, low-fat  diet.  Started her on Lipitor.  She will get her liver function checked  when she follows up outpatient..   #5.  MILDLY ELEVATED LIVER FUNCTION TESTS:  The patient's ALT was mildly  elevated on discharge.  She will follow up for further workup per  primary care physician.   DISCHARGE LABORATORY DATA:  TSH 3.080.  Total cholesterol of 279, HDL  46, triglycerides 150, LDL 203.  Sodium 139, potassium 3.9, chloride  104, CO2 26, glucose 98, BUN 17 , creatinine 1.08, bilirubin 1.6,  alkaline phosphatase 98, AST 25, ALT 61, total protein 6.8.  WBC 10.3,  hemoglobin 14.7, platelets 278.  BNP of  235. CK 275, MB 3.82, relative  index 1.4, troponin 0.09, CK 160, MB 3.3, relative index 2.1, troponin  0.10.   Chest x-ray showed cardiomegaly with early interstitial edema, bibasilar  atelectasis.  Ladell Pier, M.D.  Electronically Signed     NJ/MEDQ  D:  05/15/2007  T:  05/15/2007  Job:  [id number]   cc:   Osvaldo Shipper. Spruill, M.D.  Fax: [phone]   Renaye Rakers, M.D.  Fax: [phone]

## 2010-11-16 ENCOUNTER — Encounter: Payer: Self-pay | Admitting: *Deleted

## 2010-11-16 ENCOUNTER — Ambulatory Visit (INDEPENDENT_AMBULATORY_CARE_PROVIDER_SITE_OTHER): Payer: Commercial Managed Care - PPO | Admitting: *Deleted

## 2010-11-16 DIAGNOSIS — I472 Ventricular tachycardia: Secondary | ICD-10-CM

## 2010-11-16 DIAGNOSIS — I5022 Chronic systolic (congestive) heart failure: Secondary | ICD-10-CM

## 2010-11-16 LAB — ICD DEVICE OBSERVATION
BATTERY VOLTAGE: 3.07 V
DEV-0020ICD: NEGATIVE
DEVICE MODEL ICD: 60485337
PACEART VT: 0
RV LEAD AMPLITUDE: 11.9 mv
TOT-0001: 0
TOT-0002: 3
TOT-0004: 13
TZAT-0001FASTVT: 1
TZAT-0005FASTVT: 85 pct
TZAT-0005SLOWVT: 85 pct
TZAT-0005SLOWVT: 85 pct
TZAT-0012SLOWVT: 200 ms
TZAT-0012SLOWVT: 200 ms
TZAT-0013SLOWVT: 4
TZAT-0018FASTVT: NEGATIVE
TZAT-0018SLOWVT: NEGATIVE
TZAT-0019FASTVT: 7.5 V
TZAT-0019SLOWVT: 7.5 V
TZAT-0020SLOWVT: 1.5 ms
TZON-0003FASTVT: 290 ms
TZON-0003SLOWVT: 340 ms
TZON-0004FASTVT: 14
TZON-0004SLOWVT: 16
TZON-0005FASTVT: 10
TZST-0001FASTVT: 3
TZST-0001FASTVT: 6
TZST-0001FASTVT: 8
TZST-0001SLOWVT: 10
TZST-0001SLOWVT: 4
TZST-0001SLOWVT: 5
TZST-0001SLOWVT: 6
TZST-0001SLOWVT: 8
TZST-0003FASTVT: 40 J
TZST-0003FASTVT: 40 J
TZST-0003SLOWVT: 30 J
TZST-0003SLOWVT: 40 J
TZST-0003SLOWVT: 40 J
TZST-0003SLOWVT: 40 J

## 2010-11-16 NOTE — Progress Notes (Signed)
icd checked in device clinic.

## 2010-11-30 ENCOUNTER — Encounter: Payer: Self-pay | Admitting: Cardiology

## 2010-12-02 ENCOUNTER — Encounter: Payer: Self-pay | Admitting: Internal Medicine

## 2010-12-11 ENCOUNTER — Ambulatory Visit (INDEPENDENT_AMBULATORY_CARE_PROVIDER_SITE_OTHER): Payer: Commercial Managed Care - PPO | Admitting: Internal Medicine

## 2010-12-11 ENCOUNTER — Encounter (INDEPENDENT_AMBULATORY_CARE_PROVIDER_SITE_OTHER): Payer: Commercial Managed Care - PPO

## 2010-12-11 ENCOUNTER — Encounter: Payer: Self-pay | Admitting: Internal Medicine

## 2010-12-11 VITALS — BP 140/72 | HR 74 | Ht 64.0 in | Wt 171.0 lb

## 2010-12-11 DIAGNOSIS — Z95810 Presence of automatic (implantable) cardiac defibrillator: Secondary | ICD-10-CM

## 2010-12-11 DIAGNOSIS — I472 Ventricular tachycardia: Secondary | ICD-10-CM

## 2010-12-11 DIAGNOSIS — R0989 Other specified symptoms and signs involving the circulatory and respiratory systems: Secondary | ICD-10-CM

## 2010-12-11 DIAGNOSIS — I5022 Chronic systolic (congestive) heart failure: Secondary | ICD-10-CM

## 2010-12-11 NOTE — Assessment & Plan Note (Signed)
She has had no recurrent ventricular arrhythmias. She will continue her current medical therapy. 

## 2010-12-11 NOTE — Patient Instructions (Signed)
Your physician wants you to follow-up in:  12 months.  You will receive a reminder letter in the mail two months in advance. If you don't receive a letter, please call our office to schedule the follow-up appointment.   

## 2010-12-11 NOTE — Assessment & Plan Note (Signed)
Her device is working normally. We'll recheck in several months. 

## 2010-12-11 NOTE — Assessment & Plan Note (Signed)
Her symptoms remain class II. I've instructed her to maintain a low-sodium diet and continue her current medical therapy.

## 2010-12-11 NOTE — Progress Notes (Signed)
HPI Kaitlin [name] returns today for followup. She is a 65 year old woman with a nonischemic cardiomyopathy, chronic systolic heart failure, hypertension, status post ICD implantation. The patient denies chest pain, shortness of breath, or syncope. She has had no ICD shocks. No Known Allergies   Current Outpatient Prescriptions  Medication Sig Dispense Refill  . acetaminophen (TYLENOL) 325 MG tablet Take 650 mg by mouth as needed.        [name] aspirin 81 MG tablet Take 81 mg by mouth daily.        . benazepril (LOTENSIN) 10 MG tablet Take 15 mg by mouth daily.        . carvedilol (COREG) 25 MG tablet Take 25 mg by mouth 2 (two) times daily.        . fish oil-omega-3 fatty acids 1000 MG capsule Take 1 g by mouth daily.        . furosemide (LASIX) 40 MG tablet Take 40 mg by mouth daily. Or as directed        . Multiple Vitamin (MULTIVITAMIN) capsule Take 1 capsule by mouth daily.        . potassium chloride (KLOR-CON) 20 MEQ packet Take 20 mEq by mouth 2 (two) times daily.        . simvastatin (ZOCOR) 40 MG tablet Take 40 mg by mouth at bedtime.           Past Medical History  Diagnosis Date  . Tachycardia   . Hypokalemia   . Pulmonary edema   . CHF (congestive heart failure)   . DVT (deep venous thrombosis)   . Dyslipidemia   . HTN (hypertension)   . Cardiomyopathy     nonischemic  . Ventricular tachycardia     with ICD  . Goiter     ROS:   All systems reviewed and negative except as noted in the HPI.   No past surgical history on file.   Family History  Problem Relation Age of Onset  . Emphysema Mother   . Heart disease Mother   . Heart attack Father   . Emphysema Brother      History   Social History  . Marital Status: Single    Spouse Name: N/A    Number of Children: N/A  . Years of Education: N/A   Occupational History  . 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 Smoking status: Never Smoker   . Smokeless tobacco: Not on file    . Alcohol Use: Not on file  . Drug Use: Not on file  . Sexually Active: Not on file   Other Topics Concern  . Not on file   Social History Narrative  . No narrative on file     BP 140/72  Pulse 74  Ht 5\' 4"  (1.626 m)  Wt 171 lb (77.565 kg)  BMI 29.35 kg/m2  Physical Exam:  Well appearing NAD HEENT: Unremarkable Neck:  No JVD, no thyromegally Lymphatics:  No adenopathy Back:  No CVA tenderness Lungs:  Clear. Well-healed ICD incision. HEART:  Regular rate rhythm, no murmurs, no rubs, no clicks Abd:  soft, positive bowel sounds, no organomegally, no rebound, no guarding Ext:  2 plus pulses, no edema, no cyanosis, no clubbing Skin:  No rashes no nodules Neuro:  CN II through XII intact, motor grossly intact  DEVICE  Normal device function.  See PaceArt for details.   Assess/Plan:

## 2010-12-29 ENCOUNTER — Encounter: Payer: Self-pay | Admitting: Cardiology

## 2010-12-29 ENCOUNTER — Ambulatory Visit (INDEPENDENT_AMBULATORY_CARE_PROVIDER_SITE_OTHER): Payer: Commercial Managed Care - PPO | Admitting: Cardiology

## 2010-12-29 DIAGNOSIS — I509 Heart failure, unspecified: Secondary | ICD-10-CM

## 2010-12-29 DIAGNOSIS — I472 Ventricular tachycardia: Secondary | ICD-10-CM

## 2010-12-29 DIAGNOSIS — I10 Essential (primary) hypertension: Secondary | ICD-10-CM

## 2010-12-29 NOTE — Assessment & Plan Note (Signed)
She is up to date with ICD follow up.  No change in therapy is indicated.

## 2010-12-29 NOTE — Patient Instructions (Signed)
Your physician has requested that you have an echocardiogram. Echocardiography is a painless test that uses sound waves to create images of your heart. It provides your doctor with information about the size and shape of your heart and how well your heart's chambers and valves are working. This procedure takes approximately one hour. There are no restrictions for this procedure.  The current medical regimen is effective;  continue present plan and medications.  Follow up in 6 months with Dr Antoine Poche.  You will receive a letter in the mail 2 months before you are due.  Please call us when you receive this letter to schedule your follow up appointment.

## 2010-12-29 NOTE — Assessment & Plan Note (Signed)
She seems to be euvolemic.  At this point, no change in therapy is indicated.  We have reviewed salt and fluid restrictions.  No further cardiovascular testing is indicated.  I will repeat an echo as she has not had one since 2010.

## 2010-12-29 NOTE — Assessment & Plan Note (Signed)
The blood pressure is at target. No change in medications is indicated. We will continue with therapeutic lifestyle changes (TLC).  

## 2010-12-29 NOTE — Progress Notes (Signed)
HPI The patient presents for follow up of her cardiomyopathy and VTach.  Since I last saw her she has done well.  The patient denies any new symptoms such as chest discomfort, neck or arm discomfort. There has been no new shortness of breath, PND or orthopnea. There have been no reported palpitations, presyncope or syncope. She does have some days when she is more fatigued.  She does exercise at the [facility] and does not bring on any symptoms related to this. No Known Allergies  Current Outpatient Prescriptions  Medication Sig Dispense Refill  . acetaminophen (TYLENOL) 325 MG tablet Take 650 mg by mouth as needed.        [name] aspirin 81 MG tablet Take 81 mg by mouth daily.        . benazepril (LOTENSIN) 10 MG tablet Take 15 mg by mouth daily.        . carvedilol (COREG) 25 MG tablet Take 25 mg by mouth 2 (two) times daily.        . fish oil-omega-3 fatty acids 1000 MG capsule Take 1 g by mouth daily.        . furosemide (LASIX) 40 MG tablet Take 40 mg by mouth daily. Or as directed        . Multiple Vitamin (MULTIVITAMIN) capsule Take 1 capsule by mouth daily.        . potassium chloride (KLOR-CON) 20 MEQ packet Take 20 mEq by mouth 2 (two) times daily.        . simvastatin (ZOCOR) 40 MG tablet Take 40 mg by mouth at bedtime.          Past Medical History  Diagnosis Date  . Tachycardia   . Hypokalemia   . Pulmonary edema   . CHF (congestive heart failure)   . DVT (deep venous thrombosis)   . Dyslipidemia   . HTN (hypertension)   . Cardiomyopathy     nonischemic  . Ventricular tachycardia     with ICD  . Goiter     Past Surgical History  Procedure Date  . None     ROS:  As stated in the HPI and negative for all other systems.  PHYSICAL EXAM BP 122/88  Pulse 74  Resp 16  Ht 5\' 3"  (1.6 m)  Wt 168 lb (76.204 kg)  BMI 29.76 kg/m2 GENERAL:  Well appearing HEENT:  Pupils equal round and reactive, fundi not visualized, oral mucosa unremarkable NECK:  No jugular venous distention,  waveform within normal limits, carotid upstroke brisk and symmetric, no bruits, no thyromegaly LYMPHATICS:  No cervical, inguinal adenopathy LUNGS:  Clear to auscultation bilaterally BACK:  No CVA tenderness CHEST:  Well healed ICD scar HEART:  PMI not displaced or sustained,S1 and S2 within normal limits, no S3, no S4, no clicks, no rubs, no murmurs ABD:  Flat, positive bowel sounds normal in frequency in pitch, no bruits, no rebound, no guarding, no midline pulsatile mass, no hepatomegaly, no splenomegaly EXT:  2 plus pulses throughout, no edema, no cyanosis no clubbing SKIN:  No rashes no nodules NEURO:  Cranial nerves II through XII grossly intact, motor grossly intact throughout PSYCH:  Cognitively intact, oriented to person place and time  EKG:  Sinus bradycardia, first degree AV block, premature ectopic complexes, poor anterior R wave progression, LAD, less ectopy than previous.  ASSESSMENT AND PLAN

## 2011-01-08 ENCOUNTER — Ambulatory Visit (HOSPITAL_COMMUNITY): Payer: Commercial Managed Care - PPO | Attending: Cardiology | Admitting: Radiology

## 2011-01-08 DIAGNOSIS — I10 Essential (primary) hypertension: Secondary | ICD-10-CM | POA: Insufficient documentation

## 2011-01-08 DIAGNOSIS — I379 Nonrheumatic pulmonary valve disorder, unspecified: Secondary | ICD-10-CM | POA: Insufficient documentation

## 2011-01-08 DIAGNOSIS — I472 Ventricular tachycardia: Secondary | ICD-10-CM

## 2011-01-08 DIAGNOSIS — I509 Heart failure, unspecified: Secondary | ICD-10-CM | POA: Insufficient documentation

## 2011-01-08 DIAGNOSIS — I079 Rheumatic tricuspid valve disease, unspecified: Secondary | ICD-10-CM | POA: Insufficient documentation

## 2011-01-08 DIAGNOSIS — E785 Hyperlipidemia, unspecified: Secondary | ICD-10-CM | POA: Insufficient documentation

## 2011-01-08 DIAGNOSIS — I059 Rheumatic mitral valve disease, unspecified: Secondary | ICD-10-CM | POA: Insufficient documentation

## 2011-01-08 DIAGNOSIS — I428 Other cardiomyopathies: Secondary | ICD-10-CM

## 2011-01-11 ENCOUNTER — Telehealth: Payer: Self-pay | Admitting: Cardiology

## 2011-01-12 NOTE — Telephone Encounter (Signed)
Error

## 2011-01-21 LAB — COMPREHENSIVE METABOLIC PANEL
AST: 25
Albumin: 3.9
Chloride: 104
Creatinine, Ser: 1.08
GFR calc Af Amer: >60
Potassium: 3.9
Total Bilirubin: 1.6 — ABNORMAL HIGH

## 2011-01-21 LAB — CK TOTAL AND CKMB (NOT AT ARMC)
CK, MB: 2.5
Relative Index: INVALID

## 2011-01-21 LAB — CARDIAC PANEL(CRET KIN+CKTOT+MB+TROPI)
Relative Index: 1.4
Troponin I: 0.1 — ABNORMAL HIGH

## 2011-01-21 LAB — DIFFERENTIAL
Eosinophils Relative: 1
Lymphocytes Relative: 24
Lymphs Abs: 2.6
Monocytes Relative: 4

## 2011-01-21 LAB — CBC
HCT: 40.5
HCT: 41.4
Hemoglobin: 13.8
Hemoglobin: 14.3
MCV: 88.3
Platelets: 278
Platelets: 321
RBC: 4.58
RBC: 4.69
WBC: 10.3
WBC: 11 — ABNORMAL HIGH
WBC: 8.9

## 2011-01-21 LAB — BASIC METABOLIC PANEL
BUN: 12
Calcium: 9.4
Creatinine, Ser: 0.99
GFR calc Af Amer: >60
GFR calc non Af Amer: 57 — ABNORMAL LOW
Potassium: 3.3 — ABNORMAL LOW
Sodium: 140

## 2011-01-21 LAB — LIPID PANEL
HDL: 46
Triglycerides: 150 — ABNORMAL HIGH
VLDL: 30

## 2011-01-21 LAB — B-NATRIURETIC PEPTIDE (CONVERTED LAB)
Pro B Natriuretic peptide (BNP): 235 — ABNORMAL HIGH
Pro B Natriuretic peptide (BNP): 257 — ABNORMAL HIGH

## 2011-01-21 LAB — POCT CARDIAC MARKERS
CKMB, poc: 1.6
Operator id: 4761
Troponin i, poc: <0.05

## 2011-01-21 LAB — RAPID URINE DRUG SCREEN, HOSP PERFORMED
Benzodiazepines: NOT DETECTED
Cocaine: NOT DETECTED
Tetrahydrocannabinol: NOT DETECTED

## 2011-03-11 ENCOUNTER — Ambulatory Visit (INDEPENDENT_AMBULATORY_CARE_PROVIDER_SITE_OTHER): Payer: Commercial Managed Care - PPO | Admitting: *Deleted

## 2011-03-11 ENCOUNTER — Encounter: Payer: Commercial Managed Care - PPO | Admitting: *Deleted

## 2011-03-11 DIAGNOSIS — Z95810 Presence of automatic (implantable) cardiac defibrillator: Secondary | ICD-10-CM

## 2011-03-11 DIAGNOSIS — I428 Other cardiomyopathies: Secondary | ICD-10-CM

## 2011-03-11 DIAGNOSIS — I5022 Chronic systolic (congestive) heart failure: Secondary | ICD-10-CM

## 2011-03-23 ENCOUNTER — Encounter: Payer: Self-pay | Admitting: Internal Medicine

## 2011-03-23 ENCOUNTER — Other Ambulatory Visit: Payer: Self-pay | Admitting: Internal Medicine

## 2011-03-23 LAB — REMOTE ICD DEVICE
BRDY-0002RV: 45 {beats}/min
DEVICE MODEL ICD: 60485337
HV IMPEDENCE: 42 Ohm
TZAT-0001SLOWVT: 2
TZAT-0004FASTVT: 8
TZAT-0004SLOWVT: 8
TZAT-0004SLOWVT: 8
TZAT-0005FASTVT: 85 pct
TZAT-0005SLOWVT: 85 pct
TZAT-0012FASTVT: 200 ms
TZAT-0013FASTVT: 2
TZAT-0013SLOWVT: 4
TZAT-0020SLOWVT: 1.5 ms
TZON-0005SLOWVT: 12
TZON-0010SLOWVT: 24 ms
TZST-0001FASTVT: 5
TZST-0001FASTVT: 7
TZST-0001FASTVT: 9
TZST-0001SLOWVT: 10
TZST-0001SLOWVT: 3
TZST-0001SLOWVT: 4
TZST-0001SLOWVT: 6
TZST-0001SLOWVT: 9
TZST-0003FASTVT: 40 J
TZST-0003FASTVT: 40 J
TZST-0003FASTVT: 40 J
TZST-0003FASTVT: 40 J
TZST-0003SLOWVT: 20 J
TZST-0003SLOWVT: 40 J
TZST-0003SLOWVT: 40 J
TZST-0003SLOWVT: 40 J
TZST-0003SLOWVT: 40 J

## 2011-03-24 NOTE — Progress Notes (Signed)
Remote icd check  

## 2011-06-24 ENCOUNTER — Encounter: Payer: Commercial Managed Care - PPO | Admitting: *Deleted

## 2011-06-28 ENCOUNTER — Encounter: Payer: Self-pay | Admitting: *Deleted

## 2011-06-28 ENCOUNTER — Ambulatory Visit: Payer: Commercial Managed Care - PPO | Admitting: Cardiology

## 2011-08-02 ENCOUNTER — Ambulatory Visit (INDEPENDENT_AMBULATORY_CARE_PROVIDER_SITE_OTHER): Payer: Commercial Managed Care - PPO | Admitting: *Deleted

## 2011-08-02 ENCOUNTER — Encounter: Payer: Self-pay | Admitting: Internal Medicine

## 2011-08-02 DIAGNOSIS — I472 Ventricular tachycardia: Secondary | ICD-10-CM

## 2011-08-02 LAB — ICD DEVICE OBSERVATION
BRDY-0002RV: 45 {beats}/min
DEVICE MODEL ICD: 60485337
HV IMPEDENCE: 46 Ohm
RV LEAD IMPEDENCE ICD: 538 Ohm
TZAT-0001SLOWVT: 1
TZAT-0004FASTVT: 8
TZAT-0004SLOWVT: 8
TZAT-0005FASTVT: 85 pct
TZAT-0005SLOWVT: 85 pct
TZAT-0005SLOWVT: 85 pct
TZAT-0012FASTVT: 200 ms
TZAT-0012SLOWVT: 200 ms
TZAT-0013FASTVT: 2
TZAT-0018FASTVT: NEGATIVE
TZON-0003FASTVT: 290 ms
TZON-0003SLOWVT: 340 ms
TZON-0004FASTVT: 14
TZST-0001FASTVT: 3
TZST-0001FASTVT: 5
TZST-0001FASTVT: 8
TZST-0001FASTVT: 9
TZST-0001SLOWVT: 10
TZST-0001SLOWVT: 4
TZST-0001SLOWVT: 8
TZST-0003FASTVT: 40 J
TZST-0003FASTVT: 40 J
TZST-0003FASTVT: 40 J
TZST-0003FASTVT: 40 J
TZST-0003FASTVT: 40 J
TZST-0003SLOWVT: 20 J
TZST-0003SLOWVT: 40 J
TZST-0003SLOWVT: 40 J

## 2011-08-02 NOTE — Progress Notes (Signed)
ICD check by industry for research 

## 2011-08-19 ENCOUNTER — Other Ambulatory Visit: Payer: Self-pay

## 2011-08-19 ENCOUNTER — Other Ambulatory Visit: Payer: Self-pay | Admitting: *Deleted

## 2011-08-19 ENCOUNTER — Other Ambulatory Visit: Payer: Self-pay | Admitting: Cardiology

## 2011-08-19 MED ORDER — SIMVASTATIN 40 MG PO TABS: 40.0000 mg | ORAL_TABLET | Freq: Every day | ORAL | Status: DC

## 2011-08-19 MED ORDER — CARVEDILOL 25 MG PO TABS: 25.0000 mg | ORAL_TABLET | Freq: Two times a day (BID) | ORAL | Status: DC

## 2011-08-19 NOTE — Telephone Encounter (Signed)
..   Requested Prescriptions   Pending Prescriptions Disp Refills  . simvastatin (ZOCOR) 40 MG tablet 30 tablet 1    Sig: Take 1 tablet (40 mg total) by mouth at bedtime.  Patient needs to make appointment

## 2011-08-19 NOTE — Telephone Encounter (Signed)
Pt is out of all her meds and needs [facility] sent [facility] but please send enough simvastatin and cozzar to [facility] on [address] To get by until she gets her mail order

## 2011-08-20 ENCOUNTER — Other Ambulatory Visit: Payer: Self-pay | Admitting: Internal Medicine

## 2011-09-28 ENCOUNTER — Encounter: Payer: Self-pay | Admitting: *Deleted

## 2011-09-28 ENCOUNTER — Encounter: Payer: Self-pay | Admitting: Cardiology

## 2011-09-28 ENCOUNTER — Ambulatory Visit (INDEPENDENT_AMBULATORY_CARE_PROVIDER_SITE_OTHER): Payer: Commercial Managed Care - PPO | Admitting: Cardiology

## 2011-09-28 VITALS — BP 129/89 | HR 75 | Ht 63.0 in | Wt 175.8 lb

## 2011-09-28 DIAGNOSIS — I10 Essential (primary) hypertension: Secondary | ICD-10-CM

## 2011-09-28 DIAGNOSIS — I472 Ventricular tachycardia: Secondary | ICD-10-CM

## 2011-09-28 DIAGNOSIS — I509 Heart failure, unspecified: Secondary | ICD-10-CM

## 2011-09-28 DIAGNOSIS — I428 Other cardiomyopathies: Secondary | ICD-10-CM

## 2011-09-28 NOTE — Assessment & Plan Note (Signed)
She has had no symptomatic palpitations.  No further work up or change in therapy is indicated.

## 2011-09-28 NOTE — Assessment & Plan Note (Signed)
She seems to be euvolemic.  At this point, no change in therapy is indicated.  We have reviewed salt and fluid restrictions.  No further cardiovascular testing is indicated.  I will repeat an echo in Sept one year from the previous.

## 2011-09-28 NOTE — Progress Notes (Signed)
   HPI The patient presents for follow up of her cardiomyopathy and VTach.  Since I last saw her she has done well.  The patient denies any new symptoms such as chest discomfort, neck or arm discomfort. There has been no new shortness of breath, PND or orthopnea. There have been no reported palpitations, presyncope or syncope. She has not been exercising because her granddaughter has been living with her.  She has had to cook.  She also has had some leg pain that has limited her.  No Known Allergies  Current Outpatient Prescriptions  Medication Sig Dispense Refill  . acetaminophen (TYLENOL) 325 MG tablet Take 650 mg by mouth as needed.        [name] aspirin 81 MG tablet Take 81 mg by mouth daily.        . benazepril (LOTENSIN) 10 MG tablet TAKE 1 AND 1/2 TABLETS     DAILY  135 tablet  2  . carvedilol (COREG) 25 MG tablet Take 1 tablet (25 mg total) by mouth 2 (two) times daily.  60 tablet  6  . fish oil-omega-3 fatty acids 1000 MG capsule Take 1 g by mouth daily.        . furosemide (LASIX) 40 MG tablet TAKE 1 TABLET DAILY OR AS  DIRECTED  90 tablet  2  . Multiple Vitamin (MULTIVITAMIN) capsule Take 1 capsule by mouth daily.        . potassium chloride (KLOR-CON) 20 MEQ packet Take 20 mEq by mouth 2 (two) times daily.        . simvastatin (ZOCOR) 40 MG tablet Take 1 tablet (40 mg total) by mouth at bedtime.  30 tablet  6    Past Medical History  Diagnosis Date  . Tachycardia   . Hypokalemia   . Pulmonary edema   . CHF (congestive heart failure)   . DVT (deep venous thrombosis)   . Dyslipidemia   . HTN (hypertension)   . Cardiomyopathy     nonischemic  . Ventricular tachycardia     with ICD  . Goiter     Past Surgical History  Procedure Date  . None     ROS:  As stated in the HPI and negative for all other systems.  PHYSICAL EXAM BP 129/89  Pulse 75  Ht 5\' 3"  (1.6 m)  Wt 175 lb 12.8 oz (79.742 kg)  BMI 31.14 kg/m2 GENERAL:  Well appearing HEENT:  Pupils equal round and  reactive, fundi not visualized, oral mucosa unremarkable NECK:  No jugular venous distention, waveform within normal limits, carotid upstroke brisk and symmetric, no bruits, no thyromegaly LYMPHATICS:  No cervical, inguinal adenopathy LUNGS:  Clear to auscultation bilaterally BACK:  No CVA tenderness CHEST:  Well healed ICD scar HEART:  PMI not displaced or sustained,S1 and S2 within normal limits, no S3, no S4, no clicks, no rubs, no murmurs, ectopy ABD:  Flat, positive bowel sounds normal in frequency in pitch, no bruits, no rebound, no guarding, no midline pulsatile mass, no hepatomegaly, no splenomegaly EXT:  2 plus pulses throughout, no edema, no cyanosis no clubbing  EKG:  Sinus bradycardia, first degree AV block, premature ectopic complexes, poor anterior R wave progression, LAD. 09/28/2011  ASSESSMENT AND PLAN

## 2011-09-28 NOTE — Patient Instructions (Signed)
The current medical regimen is effective;  continue present plan and medications.  Your physician has requested that you have an echocardiogram in September. Echocardiography is a painless test that uses sound waves to create images of your heart. It provides your doctor with information about the size and shape of your heart and how well your heart's chambers and valves are working. This procedure takes approximately one hour. There are no restrictions for this procedure.  Follow up in 1 year with Dr Antoine Poche.  You will receive a letter in the mail 2 months before you are due.  Please call us when you receive this letter to schedule your follow up appointment.

## 2011-09-28 NOTE — Assessment & Plan Note (Signed)
The blood pressure is at target. No change in medications is indicated. We will continue with therapeutic lifestyle changes (TLC).  

## 2011-10-11 ENCOUNTER — Other Ambulatory Visit: Payer: Self-pay | Admitting: *Deleted

## 2011-10-11 MED ORDER — CARVEDILOL 25 MG PO TABS: 25.0000 mg | ORAL_TABLET | Freq: Two times a day (BID) | ORAL | Status: DC

## 2011-10-11 MED ORDER — SIMVASTATIN 40 MG PO TABS: 40.0000 mg | ORAL_TABLET | Freq: Every day | ORAL | Status: DC

## 2011-11-16 ENCOUNTER — Encounter: Payer: Self-pay | Admitting: Internal Medicine

## 2011-11-16 ENCOUNTER — Ambulatory Visit (INDEPENDENT_AMBULATORY_CARE_PROVIDER_SITE_OTHER): Payer: Commercial Managed Care - PPO | Admitting: Internal Medicine

## 2011-11-16 ENCOUNTER — Encounter: Payer: Self-pay | Admitting: *Deleted

## 2011-11-16 VITALS — BP 116/78 | HR 78 | Wt 176.0 lb

## 2011-11-16 DIAGNOSIS — Z95810 Presence of automatic (implantable) cardiac defibrillator: Secondary | ICD-10-CM

## 2011-11-16 DIAGNOSIS — I472 Ventricular tachycardia: Secondary | ICD-10-CM

## 2011-11-16 DIAGNOSIS — I428 Other cardiomyopathies: Secondary | ICD-10-CM

## 2011-11-16 DIAGNOSIS — I5022 Chronic systolic (congestive) heart failure: Secondary | ICD-10-CM

## 2011-11-16 NOTE — Assessment & Plan Note (Signed)
Her current symptoms are class II. She will continue her current medical therapy and maintain a low-sodium diet. 

## 2011-11-16 NOTE — Patient Instructions (Addendum)
Your physician wants you to follow-up in: 12 months with Dr Taylor You will receive a reminder letter in the mail two months in advance. If you don't receive a letter, please call our office to schedule the follow-up appointment.   Remote monitoring is used to monitor your Pacemaker of ICD from home. This monitoring reduces the number of office visits required to check your device to one time per year. It allows us to keep an eye on the functioning of your device to ensure it is working properly. You are scheduled for a device check from home on 02/21/2012. You may send your transmission at any time that day. If you have a wireless device, the transmission will be sent automatically. After your physician reviews your transmission, you will receive a postcard with your next transmission date.   

## 2011-11-16 NOTE — Progress Notes (Signed)
HPI Mrs. [employer] today for followup. She is a very pleasant 66 year old woman with a nonischemic cardiomyopathy and chronic systolic heart failure, currently class II. She denies chest pain or shortness of breath. She remains active. She denies syncope or any recent ICD shock. No Known Allergies   Current Outpatient Prescriptions  Medication Sig Dispense Refill  . acetaminophen (TYLENOL) 325 MG tablet Take 650 mg by mouth as needed.        [name] aspirin 81 MG tablet Take 81 mg by mouth daily.        . benazepril (LOTENSIN) 10 MG tablet TAKE 1 AND 1/2 TABLETS     DAILY  135 tablet  2  . carvedilol (COREG) 25 MG tablet Take 1 tablet (25 mg total) by mouth 2 (two) times daily.  180 tablet  4  . fish oil-omega-3 fatty acids 1000 MG capsule Take 1 g by mouth daily.        . furosemide (LASIX) 40 MG tablet TAKE 1 TABLET DAILY OR AS  DIRECTED  90 tablet  2  . Multiple Vitamin (MULTIVITAMIN) capsule Take 1 capsule by mouth daily.        . potassium chloride (KLOR-CON) 20 MEQ packet Take 20 mEq by mouth 2 (two) times daily.        . simvastatin (ZOCOR) 40 MG tablet Take 1 tablet (40 mg total) by mouth at bedtime.  90 tablet  4     Past Medical History  Diagnosis Date  . Tachycardia   . Hypokalemia   . Pulmonary edema   . CHF (congestive heart failure)   . DVT (deep venous thrombosis)   . Dyslipidemia   . HTN (hypertension)   . Cardiomyopathy     nonischemic  . Ventricular tachycardia     with ICD  . Goiter     ROS:   All systems reviewed and negative except as noted in the HPI.   Past Surgical History  Procedure Date  . None      Family History  Problem Relation Age of Onset  . Emphysema Mother   . Heart disease Mother   . Heart attack Father   . Emphysema Brother      History   Social History  . Marital Status: Single    Spouse Name: N/A    Number of Children: N/A  . Years of Education: N/A   Occupational History  . 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 Smoking status: Never Smoker   . Smokeless tobacco: Not on file  . Alcohol Use: Not on file  . Drug Use: Not on file  . Sexually Active: Not on file   Other Topics Concern  . Not on file   Social History Narrative  . No narrative on file     BP 116/78  Pulse 78  Wt 176 lb (79.833 kg)  Physical Exam:  Well appearing middle-aged woman, NAD HEENT: Unremarkable Neck: 7 cm jugular venous distention, no thyromegaly, Back:  No CVA tenderness Lungs:  Clear with no wheezes, rales, or rhonchi. Well-healed ICD incision. HEART:  Regular rate rhythm, no murmurs, no rubs, no clicks Abd:  soft, positive bowel sounds, no organomegally, no rebound, no guarding Ext:  2 plus pulses, no edema, no cyanosis, no clubbing Skin:  No rashes no nodules Neuro:  CN II through XII intact, motor grossly intact  DEVICE  Normal device function.  See PaceArt for details.   Assess/Plan:

## 2011-11-16 NOTE — Assessment & Plan Note (Signed)
Her Biotronik ICD is working normally. We'll plan to recheck in several months.

## 2011-11-16 NOTE — Assessment & Plan Note (Addendum)
She has had one episode of ventricular tachycardia which was successfully terminated with anti-tachycardic pacing. This appears to be a new problem. I've considered adding an antiarrhythmic drug to the present time we'll hold off on increasing her medical therapy. We will undergo watchful waiting.

## 2012-01-17 ENCOUNTER — Ambulatory Visit (HOSPITAL_COMMUNITY): Payer: Commercial Managed Care - PPO | Attending: Internal Medicine | Admitting: Radiology

## 2012-01-17 DIAGNOSIS — R002 Palpitations: Secondary | ICD-10-CM | POA: Insufficient documentation

## 2012-01-17 DIAGNOSIS — I428 Other cardiomyopathies: Secondary | ICD-10-CM | POA: Insufficient documentation

## 2012-01-17 DIAGNOSIS — I369 Nonrheumatic tricuspid valve disorder, unspecified: Secondary | ICD-10-CM | POA: Insufficient documentation

## 2012-01-17 DIAGNOSIS — I509 Heart failure, unspecified: Secondary | ICD-10-CM | POA: Insufficient documentation

## 2012-01-17 DIAGNOSIS — I059 Rheumatic mitral valve disease, unspecified: Secondary | ICD-10-CM | POA: Insufficient documentation

## 2012-01-17 DIAGNOSIS — I10 Essential (primary) hypertension: Secondary | ICD-10-CM | POA: Insufficient documentation

## 2012-01-17 NOTE — Progress Notes (Signed)
Echocardiogram performed.  

## 2012-01-21 ENCOUNTER — Telehealth: Payer: Self-pay

## 2012-01-21 NOTE — Telephone Encounter (Signed)
Message copied by Yolonda Kida on Fri Jan 21, 2012  4:32 PM ------      Message from: Rollene Rotunda      Created: Tue Jan 18, 2012  5:14 PM       Her EF is slightly improved compared with the previous echocardiogram.  Continue current therapy.  Call Ms. Harbaugh with the results and send results to Evlyn Courier, MD

## 2012-01-21 NOTE — Telephone Encounter (Signed)
Patient aware of Echo results.

## 2012-02-21 ENCOUNTER — Encounter: Payer: Commercial Managed Care - PPO | Admitting: *Deleted

## 2012-02-22 ENCOUNTER — Encounter: Payer: Self-pay | Admitting: *Deleted

## 2012-03-25 ENCOUNTER — Other Ambulatory Visit: Payer: Self-pay | Admitting: Cardiology

## 2012-04-25 ENCOUNTER — Other Ambulatory Visit: Payer: Self-pay | Admitting: Cardiology

## 2012-05-11 ENCOUNTER — Encounter: Payer: Commercial Managed Care - PPO | Admitting: Internal Medicine

## 2012-05-31 ENCOUNTER — Encounter: Payer: Self-pay | Admitting: Internal Medicine

## 2012-05-31 ENCOUNTER — Ambulatory Visit (INDEPENDENT_AMBULATORY_CARE_PROVIDER_SITE_OTHER): Payer: Commercial Managed Care - PPO | Admitting: Internal Medicine

## 2012-05-31 ENCOUNTER — Encounter: Payer: Self-pay | Admitting: *Deleted

## 2012-05-31 VITALS — BP 122/70 | HR 89 | Ht 63.0 in | Wt 182.8 lb

## 2012-05-31 DIAGNOSIS — R000 Tachycardia, unspecified: Secondary | ICD-10-CM

## 2012-05-31 DIAGNOSIS — I5022 Chronic systolic (congestive) heart failure: Secondary | ICD-10-CM

## 2012-05-31 DIAGNOSIS — I472 Ventricular tachycardia: Secondary | ICD-10-CM

## 2012-05-31 DIAGNOSIS — I428 Other cardiomyopathies: Secondary | ICD-10-CM

## 2012-05-31 DIAGNOSIS — Z95810 Presence of automatic (implantable) cardiac defibrillator: Secondary | ICD-10-CM

## 2012-05-31 LAB — ICD DEVICE OBSERVATION
DEV-0020ICD: NEGATIVE
DEVICE MODEL ICD: 60485337
RV LEAD AMPLITUDE: 12.1 mv
RV LEAD THRESHOLD: 0.8 V
TZAT-0001FASTVT: 1
TZAT-0001SLOWVT: 1
TZAT-0004SLOWVT: 8
TZAT-0004SLOWVT: 8
TZAT-0005FASTVT: 85 pct
TZAT-0005SLOWVT: 85 pct
TZAT-0005SLOWVT: 85 pct
TZAT-0012FASTVT: 200 ms
TZAT-0012SLOWVT: 200 ms
TZAT-0013SLOWVT: 4
TZAT-0018SLOWVT: NEGATIVE
TZAT-0019FASTVT: 7.5 V
TZAT-0019SLOWVT: 7.5 V
TZAT-0020FASTVT: 1.5 ms
TZON-0003SLOWVT: 340 ms
TZON-0004FASTVT: 14
TZON-0005FASTVT: 10
TZST-0001FASTVT: 2
TZST-0001FASTVT: 3
TZST-0001FASTVT: 5
TZST-0001FASTVT: 8
TZST-0001SLOWVT: 10
TZST-0001SLOWVT: 4
TZST-0001SLOWVT: 6
TZST-0001SLOWVT: 7
TZST-0001SLOWVT: 8
TZST-0003FASTVT: 40 J
TZST-0003FASTVT: 40 J
TZST-0003FASTVT: 40 J
TZST-0003SLOWVT: 20 J
TZST-0003SLOWVT: 40 J
TZST-0003SLOWVT: 40 J

## 2012-05-31 NOTE — Assessment & Plan Note (Signed)
Her ICD is working normally. We'll plan to recheck in several months. Interrogation of her device suggests SVT as the mechanism of her tachycardia palpitations.

## 2012-05-31 NOTE — Progress Notes (Signed)
HPI Kaitlin [name] returns today for followup. She is a 67 year old woman with a nonischemic cardiomyopathy and chronic systolic heart failure. She's had a couple episodes of dizzy spells but no ICD shocks. She denies chest pain or shortness of breath. No peripheral edema. No Known Allergies   Current Outpatient Prescriptions  Medication Sig Dispense Refill  . acetaminophen (TYLENOL) 325 MG tablet Take 650 mg by mouth as needed.        [name] aspirin 81 MG tablet Take 81 mg by mouth daily.        . benazepril (LOTENSIN) 10 MG tablet TAKE ONE AND ONE-HALF TABLETS DAILY  135 tablet  1  . carvedilol (COREG) 25 MG tablet Take 1 tablet (25 mg total) by mouth 2 (two) times daily.  180 tablet  4  . fish oil-omega-3 fatty acids 1000 MG capsule Take 1 g by mouth daily.        . furosemide (LASIX) 40 MG tablet TAKE 1 TABLET DAILY OR AS DIRECTED  90 tablet  2  . Multiple Vitamin (MULTIVITAMIN) capsule Take 1 capsule by mouth daily.        . potassium chloride (KLOR-CON) 20 MEQ packet Take 20 mEq by mouth 2 (two) times daily.        . simvastatin (ZOCOR) 40 MG tablet Take 1 tablet (40 mg total) by mouth at bedtime.  90 tablet  4     Past Medical History  Diagnosis Date  . Tachycardia   . Hypokalemia   . Pulmonary edema   . CHF (congestive heart failure)   . DVT (deep venous thrombosis)   . Dyslipidemia   . HTN (hypertension)   . Cardiomyopathy     nonischemic  . Ventricular tachycardia     with ICD  . Goiter     ROS:   All systems reviewed and negative except as noted in the HPI.   Past Surgical History  Procedure Date  . None      Family History  Problem Relation Age of Onset  . Emphysema Mother   . Heart disease Mother   . Heart attack Father   . Emphysema Brother      History   Social History  . Marital Status: Single    Spouse Name: N/A    Number of Children: N/A  . Years of Education: N/A   Occupational History  . 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 Smoking status: Never Smoker   . Smokeless tobacco: Not on file  . Alcohol Use: Not on file  . Drug Use: Not on file  . Sexually Active: Not on file   Other Topics Concern  . Not on file   Social History Narrative  . No narrative on file     BP 122/70  Pulse 89  Ht 5\' 3"  (1.6 m)  Wt 182 lb 12.8 oz (82.918 kg)  BMI 32.38 kg/m2  Physical Exam:  Well appearing 67 year old woman, NAD HEENT: Unremarkable Neck:  7 cm JVD, no thyromegally Lungs:  Clear with no wheezes, rales, or rhonchi. HEART:  Regular rate rhythm, no murmurs, no rubs, no clicks Abd:  soft, positive bowel sounds, no organomegally, no rebound, no guarding Ext:  2 plus pulses, no edema, no cyanosis, no clubbing Skin:  No rashes no nodules Neuro:  CN II through XII intact, motor grossly intact   DEVICE  Normal device function.  See PaceArt for details. Several episodes of tachycardia are successfully  pace terminated  Assess/Plan:

## 2012-05-31 NOTE — Assessment & Plan Note (Signed)
Her heart failure symptoms are currently class II. She will continue her low sodium diet and her current medical therapy.

## 2012-05-31 NOTE — Patient Instructions (Addendum)
Your physician wants you to follow-up in: 12 months with Dr. Taylor. You will receive a reminder letter in the mail two months in advance. If you don't receive a letter, please call our office to schedule the follow-up appointment.    

## 2012-05-31 NOTE — Assessment & Plan Note (Signed)
Today we reprogrammed her ICD to minimize the likelihood of any unnecessary ICD shocks. If she were to receive a shock, I would recommend catheter ablation of her SVT.

## 2012-06-05 ENCOUNTER — Telehealth: Payer: Self-pay | Admitting: Internal Medicine

## 2012-06-05 NOTE — Telephone Encounter (Deleted)
Caller: Antionette/Child; Phone: [phone]; Reason for Call: Daughter was calling for her mother who was a patient of Dr Sharrell Ku.  Caller transfered to Cardiology successfully.  [phone]

## 2012-06-05 NOTE — Telephone Encounter (Signed)
error 

## 2012-06-18 ENCOUNTER — Encounter (HOSPITAL_COMMUNITY): Payer: Self-pay | Admitting: *Deleted

## 2012-06-18 ENCOUNTER — Other Ambulatory Visit: Payer: Self-pay

## 2012-06-18 ENCOUNTER — Emergency Department (HOSPITAL_COMMUNITY): Payer: Commercial Managed Care - PPO

## 2012-06-18 ENCOUNTER — Inpatient Hospital Stay (HOSPITAL_COMMUNITY)
Admission: EM | Admit: 2012-06-18 | Discharge: 2012-06-27 | DRG: 280 | Disposition: A | Discharge status: Home / Self Care | Payer: Commercial Managed Care - PPO | Attending: Cardiology | Admitting: Cardiology

## 2012-06-18 DIAGNOSIS — I429 Cardiomyopathy, unspecified: Secondary | ICD-10-CM

## 2012-06-18 DIAGNOSIS — I509 Heart failure, unspecified: Secondary | ICD-10-CM | POA: Diagnosis not present

## 2012-06-18 DIAGNOSIS — Z86718 Personal history of other venous thrombosis and embolism: Secondary | ICD-10-CM

## 2012-06-18 DIAGNOSIS — I129 Hypertensive chronic kidney disease with stage 1 through stage 4 chronic kidney disease, or unspecified chronic kidney disease: Secondary | ICD-10-CM | POA: Diagnosis present

## 2012-06-18 DIAGNOSIS — E876 Hypokalemia: Secondary | ICD-10-CM | POA: Diagnosis present

## 2012-06-18 DIAGNOSIS — I440 Atrioventricular block, first degree: Secondary | ICD-10-CM | POA: Diagnosis present

## 2012-06-18 DIAGNOSIS — R4182 Altered mental status, unspecified: Secondary | ICD-10-CM | POA: Diagnosis present

## 2012-06-18 DIAGNOSIS — R7309 Other abnormal glucose: Secondary | ICD-10-CM | POA: Diagnosis present

## 2012-06-18 DIAGNOSIS — I471 Supraventricular tachycardia: Secondary | ICD-10-CM | POA: Diagnosis present

## 2012-06-18 DIAGNOSIS — N179 Acute kidney failure, unspecified: Secondary | ICD-10-CM | POA: Diagnosis present

## 2012-06-18 DIAGNOSIS — I498 Other specified cardiac arrhythmias: Principal | ICD-10-CM | POA: Diagnosis present

## 2012-06-18 DIAGNOSIS — Z7982 Long term (current) use of aspirin: Secondary | ICD-10-CM

## 2012-06-18 DIAGNOSIS — N189 Chronic kidney disease, unspecified: Secondary | ICD-10-CM | POA: Diagnosis present

## 2012-06-18 DIAGNOSIS — Z8249 Family history of ischemic heart disease and other diseases of the circulatory system: Secondary | ICD-10-CM

## 2012-06-18 DIAGNOSIS — Z79899 Other long term (current) drug therapy: Secondary | ICD-10-CM

## 2012-06-18 DIAGNOSIS — I959 Hypotension, unspecified: Secondary | ICD-10-CM

## 2012-06-18 DIAGNOSIS — Z6830 Body mass index (BMI) 30.0-30.9, adult: Secondary | ICD-10-CM

## 2012-06-18 DIAGNOSIS — R42 Dizziness and giddiness: Secondary | ICD-10-CM

## 2012-06-18 DIAGNOSIS — J811 Chronic pulmonary edema: Secondary | ICD-10-CM | POA: Diagnosis not present

## 2012-06-18 DIAGNOSIS — K7200 Acute and subacute hepatic failure without coma: Secondary | ICD-10-CM | POA: Diagnosis present

## 2012-06-18 DIAGNOSIS — I447 Left bundle-branch block, unspecified: Secondary | ICD-10-CM | POA: Diagnosis present

## 2012-06-18 DIAGNOSIS — D72829 Elevated white blood cell count, unspecified: Secondary | ICD-10-CM | POA: Diagnosis present

## 2012-06-18 DIAGNOSIS — I2510 Atherosclerotic heart disease of native coronary artery without angina pectoris: Secondary | ICD-10-CM | POA: Diagnosis present

## 2012-06-18 DIAGNOSIS — E785 Hyperlipidemia, unspecified: Secondary | ICD-10-CM | POA: Diagnosis present

## 2012-06-18 DIAGNOSIS — E669 Obesity, unspecified: Secondary | ICD-10-CM | POA: Diagnosis present

## 2012-06-18 DIAGNOSIS — R739 Hyperglycemia, unspecified: Secondary | ICD-10-CM | POA: Diagnosis present

## 2012-06-18 DIAGNOSIS — I428 Other cardiomyopathies: Secondary | ICD-10-CM | POA: Diagnosis present

## 2012-06-18 DIAGNOSIS — I469 Cardiac arrest, cause unspecified: Secondary | ICD-10-CM

## 2012-06-18 DIAGNOSIS — E872 Acidosis, unspecified: Secondary | ICD-10-CM | POA: Diagnosis present

## 2012-06-18 DIAGNOSIS — Z95810 Presence of automatic (implantable) cardiac defibrillator: Secondary | ICD-10-CM

## 2012-06-18 DIAGNOSIS — I214 Non-ST elevation (NSTEMI) myocardial infarction: Secondary | ICD-10-CM | POA: Diagnosis present

## 2012-06-18 DIAGNOSIS — R570 Cardiogenic shock: Secondary | ICD-10-CM | POA: Diagnosis present

## 2012-06-18 DIAGNOSIS — I472 Ventricular tachycardia: Secondary | ICD-10-CM | POA: Diagnosis present

## 2012-06-18 DIAGNOSIS — I5023 Acute on chronic systolic (congestive) heart failure: Secondary | ICD-10-CM | POA: Diagnosis not present

## 2012-06-18 HISTORY — DX: Presence of automatic (implantable) cardiac defibrillator: Z95.810

## 2012-06-18 HISTORY — DX: Supraventricular tachycardia: I47.1

## 2012-06-18 HISTORY — DX: Left bundle-branch block, unspecified: I44.7

## 2012-06-18 HISTORY — DX: Supraventricular tachycardia, unspecified: I47.10

## 2012-06-18 HISTORY — DX: Other cardiomyopathies: I42.8

## 2012-06-18 HISTORY — DX: Personal history of other venous thrombosis and embolism: Z86.718

## 2012-06-18 HISTORY — DX: Chronic systolic (congestive) heart failure: I50.22

## 2012-06-18 LAB — CBC
HCT: 37.8 % (ref 36.0–46.0)
Hemoglobin: 12.8 g/dL (ref 12.0–15.0)
MCV: 93.1 fL (ref 78.0–100.0)
RDW: 14.1 % (ref 11.5–15.5)
WBC: 14.1 10*3/uL — ABNORMAL HIGH (ref 4.0–10.5)

## 2012-06-18 LAB — TSH: TSH: 1.641 u[IU]/mL (ref 0.350–4.500)

## 2012-06-18 LAB — CBC WITH DIFFERENTIAL/PLATELET
Basophils Absolute: 0 10*3/uL (ref 0.0–0.1)
Basophils Relative: 0 % (ref 0–1)
Eosinophils Absolute: 0 10*3/uL (ref 0.0–0.7)
MCH: 30.3 pg (ref 26.0–34.0)
MCHC: 32.6 g/dL (ref 30.0–36.0)
Monocytes Absolute: 0.5 10*3/uL (ref 0.1–1.0)
Monocytes Relative: 4 % (ref 3–12)
Neutro Abs: 8.8 10*3/uL — ABNORMAL HIGH (ref 1.7–7.7)
Neutrophils Relative %: 79 % — ABNORMAL HIGH (ref 43–77)
RDW: 13.9 % (ref 11.5–15.5)

## 2012-06-18 LAB — MAGNESIUM: Magnesium: 2.4 mg/dL (ref 1.5–2.5)

## 2012-06-18 LAB — COMPREHENSIVE METABOLIC PANEL
AST: 147 U/L — ABNORMAL HIGH (ref 0–37)
Albumin: 3.7 g/dL (ref 3.5–5.2)
BUN: 27 mg/dL — ABNORMAL HIGH (ref 6–23)
Chloride: 107 mEq/L (ref 96–112)
Creatinine, Ser: 2.8 mg/dL — ABNORMAL HIGH (ref 0.50–1.10)
Potassium: 5 mEq/L (ref 3.5–5.1)
Total Bilirubin: 1.7 mg/dL — ABNORMAL HIGH (ref 0.3–1.2)
Total Protein: 6.6 g/dL (ref 6.0–8.3)

## 2012-06-18 LAB — MRSA PCR SCREENING: MRSA by PCR: NEGATIVE

## 2012-06-18 LAB — CORTISOL: Cortisol, Plasma: 29.6 ug/dL

## 2012-06-18 LAB — CREATININE, SERUM
GFR calc Af Amer: 20 mL/min — ABNORMAL LOW (ref >90)
GFR calc non Af Amer: 17 mL/min — ABNORMAL LOW (ref >90)

## 2012-06-18 LAB — GLUCOSE, CAPILLARY
Glucose-Capillary: 143 mg/dL — ABNORMAL HIGH (ref 70–99)
Glucose-Capillary: 97 mg/dL (ref 70–99)

## 2012-06-18 MED ORDER — NOREPINEPHRINE BITARTRATE 1 MG/ML IJ SOLN
INTRAMUSCULAR | Status: AC
  Filled 2012-06-18: qty 4

## 2012-06-18 MED ORDER — FENTANYL CITRATE 0.05 MG/ML IJ SOLN
INTRAMUSCULAR | Status: AC
  Filled 2012-06-18: qty 2

## 2012-06-18 MED ORDER — DEXTROSE 5 % IV SOLN
INTRAVENOUS | Status: AC
  Administered 2012-06-18: 250 mL
  Filled 2012-06-18: qty 250

## 2012-06-18 MED ORDER — ACETAMINOPHEN 325 MG PO TABS
650.0000 mg | ORAL_TABLET | ORAL | Status: DC | PRN
  Administered 2012-06-19 – 2012-06-20 (×3): 650 mg via ORAL
  Filled 2012-06-18 (×3): qty 2

## 2012-06-18 MED ORDER — ETOMIDATE 2 MG/ML IV SOLN
INTRAVENOUS | Status: AC
  Administered 2012-06-18: 10 mg
  Filled 2012-06-18: qty 10

## 2012-06-18 MED ORDER — ONDANSETRON HCL 4 MG/2ML IJ SOLN
4.0000 mg | Freq: Four times a day (QID) | INTRAMUSCULAR | Status: DC | PRN
  Administered 2012-06-18: 4 mg via INTRAVENOUS

## 2012-06-18 MED ORDER — ENOXAPARIN SODIUM 30 MG/0.3ML ~~LOC~~ SOLN
30.0000 mg | SUBCUTANEOUS | Status: DC
  Administered 2012-06-18: 30 mg via SUBCUTANEOUS
  Filled 2012-06-18 (×2): qty 0.3

## 2012-06-18 MED ORDER — NALOXONE HCL 0.4 MG/ML IJ SOLN
INTRAMUSCULAR | Status: AC
  Administered 2012-06-18: 12:00:00
  Filled 2012-06-18: qty 1

## 2012-06-18 MED ORDER — ASPIRIN 81 MG PO TABS: 81.0000 mg | ORAL_TABLET | Freq: Every day | ORAL | Status: DC

## 2012-06-18 MED ORDER — SODIUM CHLORIDE 0.9 % IV SOLN: INTRAVENOUS | Status: DC

## 2012-06-18 MED ORDER — ENOXAPARIN SODIUM 30 MG/0.3ML ~~LOC~~ SOLN: 30.0000 mg | SUBCUTANEOUS | Status: DC

## 2012-06-18 MED ORDER — NOREPINEPHRINE BITARTRATE 1 MG/ML IJ SOLN
INTRAMUSCULAR | Status: AC
  Administered 2012-06-18: 5 ug/kg/min
  Filled 2012-06-18: qty 4

## 2012-06-18 MED ORDER — NOREPINEPHRINE BITARTRATE 1 MG/ML IJ SOLN
2.0000 ug/min | INTRAVENOUS | Status: DC
  Filled 2012-06-18: qty 4

## 2012-06-18 MED ORDER — DOBUTAMINE IN D5W 4-5 MG/ML-% IV SOLN
5.0000 ug/kg/min | INTRAVENOUS | Status: DC
  Administered 2012-06-18: 5 ug/kg/min via INTRAVENOUS
  Filled 2012-06-18: qty 250

## 2012-06-18 MED ORDER — NITROGLYCERIN 0.4 MG SL SUBL: 0.4000 mg | SUBLINGUAL_TABLET | SUBLINGUAL | Status: DC | PRN

## 2012-06-18 MED ORDER — AMIODARONE HCL IN DEXTROSE 360-4.14 MG/200ML-% IV SOLN
INTRAVENOUS | Status: AC
  Administered 2012-06-18: 10:00:00
  Filled 2012-06-18: qty 200

## 2012-06-18 MED ORDER — INSULIN ASPART 100 UNIT/ML ~~LOC~~ SOLN
0.0000 [IU] | SUBCUTANEOUS | Status: DC
  Administered 2012-06-18: 1 [IU] via SUBCUTANEOUS

## 2012-06-18 MED ORDER — AMIODARONE IV BOLUS ONLY 150 MG/100ML
150.0000 mg | Freq: Once | INTRAVENOUS | Status: AC
  Administered 2012-06-18: 150 mg via INTRAVENOUS

## 2012-06-18 MED ORDER — DOBUTAMINE IN D5W 4-5 MG/ML-% IV SOLN
INTRAVENOUS | Status: AC
  Administered 2012-06-18: 5 ug
  Filled 2012-06-18: qty 250

## 2012-06-18 MED ORDER — SODIUM CHLORIDE 0.9 % IV BOLUS (SEPSIS): 500.0000 mL | Freq: Once | INTRAVENOUS | Status: DC

## 2012-06-18 MED ORDER — NOREPINEPHRINE BITARTRATE 1 MG/ML IJ SOLN
2.0000 ug/min | INTRAVENOUS | Status: DC
  Administered 2012-06-18: 50 ug/min via INTRAVENOUS
  Administered 2012-06-19: 15 ug/min via INTRAVENOUS
  Administered 2012-06-19: 8 ug/min via INTRAVENOUS
  Filled 2012-06-18 (×2): qty 16

## 2012-06-18 MED ORDER — DEXTROSE 5 % IV SOLN
INTRAVENOUS | Status: AC
  Filled 2012-06-18: qty 250

## 2012-06-18 MED ORDER — INSULIN ASPART 100 UNIT/ML ~~LOC~~ SOLN: 0.0000 [IU] | Freq: Three times a day (TID) | SUBCUTANEOUS | Status: DC

## 2012-06-18 NOTE — ED Notes (Signed)
PT started vomiting last nite and then got dizzy and remains dizzy.  Pt reports blurred vision.  Pt reports cold sweats all nite. Pt states symptoms started all the sudden after getting settled in bed last nite.  NO pain

## 2012-06-18 NOTE — ED Notes (Signed)
Amiodarone bolus complete at this time.

## 2012-06-18 NOTE — ED Notes (Signed)
50 mg fentanyl given IV push

## 2012-06-18 NOTE — ED Notes (Signed)
Bedside echo being performed

## 2012-06-18 NOTE — ED Provider Notes (Signed)
History     CSN: [id number]  Arrival date & time 06/18/12  0857   First MD Initiated Contact with Patient 06/18/12 [phone]      No chief complaint on file.   (Consider location/radiation/quality/duration/timing/severity/associated sxs/prior treatment) HPI Comments: Kaitlin [name] presents with her daughter for evaluation.  She had an episode of vomiting last night and reports still feeling nauseated.  She also reported feeling extremely dizzy.  The dizziness has been persistent.  She denies fever, cough, SOB, CP, and palpitations.  She has experienced no adominal pain.  The history is provided by the patient and a relative. The history is limited by the condition of the patient.    Past Medical History  Diagnosis Date  . Tachycardia   . Hypokalemia   . Pulmonary edema   . CHF (congestive heart failure)   . DVT (deep venous thrombosis)   . Dyslipidemia   . HTN (hypertension)   . Cardiomyopathy     nonischemic  . Ventricular tachycardia     with ICD  . Goiter     Past Surgical History  Procedure Laterality Date  . None    . Cardiac defibrillator placement      Family History  Problem Relation Age of Onset  . Emphysema Mother   . Heart disease Mother   . Heart attack Father   . Emphysema Brother     History  Substance Use Topics  . Smoking status: Never Smoker   . Smokeless tobacco: Not on file  . Alcohol Use: No    OB History   Grav Para Term Preterm Abortions TAB SAB Ect Mult Living                  Review of Systems  Unable to perform ROS: Acuity of condition  Constitutional: Positive for appetite change and fatigue. Negative for fever, chills and diaphoresis.  HENT: Negative for congestion, sore throat, rhinorrhea and neck pain.   Respiratory: Negative for cough and shortness of breath.   Cardiovascular: Negative for chest pain, palpitations and leg swelling.  Gastrointestinal: Positive for nausea and vomiting. Negative for abdominal pain and diarrhea.   Genitourinary: Negative for dysuria.  Musculoskeletal: Negative for myalgias.  Skin: Negative for rash and wound.  Neurological: Positive for dizziness, weakness (generalized) and light-headedness. Negative for syncope.  Psychiatric/Behavioral: Negative for confusion.    Allergies  Review of patient's allergies indicates no known allergies.  Home Medications   Current Outpatient Rx  Name  Route  Sig  Dispense  Refill  . acetaminophen (TYLENOL) 325 MG tablet   Oral   Take 650 mg by mouth as needed.           [name] aspirin 81 MG tablet   Oral   Take 81 mg by mouth daily.           . benazepril (LOTENSIN) 10 MG tablet      TAKE ONE AND ONE-HALF TABLETS DAILY   135 tablet   1   . carvedilol (COREG) 25 MG tablet   Oral   Take 1 tablet (25 mg total) by mouth 2 (two) times daily.   180 tablet   4   . fish oil-omega-3 fatty acids 1000 MG capsule   Oral   Take 1 g by mouth daily.           . furosemide (LASIX) 40 MG tablet      TAKE 1 TABLET DAILY OR AS DIRECTED   90 tablet  2   . Multiple Vitamin (MULTIVITAMIN) capsule   Oral   Take 1 capsule by mouth daily.           . potassium chloride (KLOR-CON) 20 MEQ packet   Oral   Take 20 mEq by mouth 2 (two) times daily.           . simvastatin (ZOCOR) 40 MG tablet   Oral   Take 1 tablet (40 mg total) by mouth at bedtime.   90 tablet   4     BP 83/60  Pulse 71  Temp(Src) 97.8 F (36.6 C) (Oral)  Resp 23  SpO2 97%  Physical Exam  Nursing note and vitals reviewed. Constitutional: She is oriented to person, place, and time. She appears well-nourished. No distress.  Pt is obese   HENT:  Head: Normocephalic and atraumatic.  Right Ear: External ear normal.  Left Ear: External ear normal.  Nose: Nose normal.  Mouth/Throat: Oropharynx is clear and moist. No oropharyngeal exudate.  Eyes: Conjunctivae and EOM are normal. Pupils are equal, round, and reactive to light. Right eye exhibits no discharge. Left  eye exhibits no discharge. No scleral icterus.  Neck: Normal range of motion. Neck supple. No JVD present. No tracheal deviation present.  Cardiovascular: Intact distal pulses and normal pulses.  Tachycardia present.  PMI is displaced.  Exam reveals no decreased pulses.   Murmur heard. Pt has a tachycardia between 180 and 190 bpm.  It is wide complex on the monitor.  Pulmonary/Chest: Effort normal and breath sounds normal. No stridor. No respiratory distress. She has no wheezes. She has no rales. She exhibits no tenderness.  Abdominal: Soft. Bowel sounds are normal. She exhibits no distension and no mass. There is no tenderness. There is no rebound and no guarding.  Musculoskeletal: Normal range of motion. She exhibits no edema and no tenderness.  Lymphadenopathy:    She has no cervical adenopathy.  Neurological: She is alert and oriented to person, place, and time. No cranial nerve deficit.  Skin: Skin is warm and dry. No rash noted. She is not diaphoretic. No erythema. No pallor.  Psychiatric: She has a normal mood and affect. Her behavior is normal.    ED Course  CARDIOVERSION Date/Time: 06/18/2012 9:52 AM Performed by: Dana Allan T Authorized by: Dana Allan T Consent: Verbal consent obtained. The procedure was performed in an emergent situation. Risks and benefits: risks, benefits and alternatives were discussed Consent given by: patient (and her daughter) Test results: test results available and properly labeled Site marked: the operative site was marked Imaging studies: imaging studies available Required items: required blood products, implants, devices, and special equipment available Patient identity confirmed: verbally with patient and arm band Patient sedated: yes Sedation type: moderate (conscious) sedation Sedatives: etomidate Analgesia: fentanyl Sedation end date/time: 06/18/2012 11:27 AM Vitals: Vital signs were monitored during sedation. Cardioversion basis:  emergent Pre-procedure rhythm: ventricular tachycardia Patient position: patient was placed in a supine position Chest area: chest area exposed Electrodes: pads Electrodes placed: anterior-lateral Number of attempts: 1 Attempt 1 mode: synchronous Attempt 1 waveform: biphasic Attempt 1 shock (in Joules): 120 Attempt 1 outcome: conversion to normal sinus rhythm Post-procedure rhythm: normal sinus rhythm Complications: hypotension Comments: Pt tolerated the procedure, she has continued changes in her mental status and hypotension.  See medical decision making for further details.  CENTRAL LINE Date/Time: 06/18/2012 11:29 AM Performed by: Dana Allan T Authorized by: Dana Allan T Consent: Verbal consent obtained. The procedure was performed in  an emergent situation. Risks and benefits: risks, benefits and alternatives were discussed Consent given by: pt's daughter. Test results: test results available and properly labeled Site marked: the operative site was marked Imaging studies: imaging studies available Required items: required blood products, implants, devices, and special equipment available Patient identity confirmed: arm band Time out: Immediately prior to procedure a "time out" was called to verify the correct patient, procedure, equipment, support staff and site/side marked as required. Indications: vascular access Anesthesia: local infiltration Local anesthetic: lidocaine 1% without epinephrine Anesthetic total: 2 ml Preparation: skin prepped with ChloraPrep Skin prep agent dried: skin prep agent completely dried prior to procedure Sterile barriers: all five maximum sterile barriers used - cap, mask, sterile gown, sterile gloves, and large sterile sheet Hand hygiene: hand hygiene performed prior to central venous catheter insertion Location details: right internal jugular Patient position: Trendelenburg Catheter type: triple lumen Catheter size: 7 Fr Pre-procedure:  landmarks identified Ultrasound guidance: yes Number of attempts: 1 Successful placement: yes Post-procedure: line sutured Assessment: blood return through all ports, free fluid flow, placement verified by x-ray and no pneumothorax on x-ray Patient tolerance: Patient tolerated the procedure well with no immediate complications.  CRITICAL CARE Performed by: Dana Allan T Authorized by: Dana Allan T Total critical care time: 150 minutes Critical care time was exclusive of separately billable procedures and treating other patients. Critical care was necessary to treat or prevent imminent or life-threatening deterioration of the following conditions: CNS failure or compromise, shock, cardiac failure and circulatory failure. Critical care was time spent personally by me on the following activities: development of treatment plan with patient or surrogate, discussions with consultants, evaluation of patient's response to treatment, examination of patient, obtaining history from patient or surrogate, ordering and review of radiographic studies, ordering and review of laboratory studies, re-evaluation of patient's condition and review of old charts. Comments: See medical decision making for further details.   (including critical care time)  Labs Reviewed  CBC WITH DIFFERENTIAL - Abnormal; Notable for the following:    WBC 11.1 (*)    Neutrophils Relative 79 (*)    Neutro Abs 8.8 (*)    All other components within normal limits  COMPREHENSIVE METABOLIC PANEL - Abnormal; Notable for the following:    CO2 17 (*)    Glucose, Bld 195 (*)    BUN 27 (*)    Creatinine, Ser 2.80 (*)    AST 147 (*)    ALT 146 (*)    Total Bilirubin 1.7 (*)    GFR calc non Af Amer 17 (*)    GFR calc Af Amer 19 (*)    All other components within normal limits  POCT I-STAT TROPONIN I - Abnormal; Notable for the following:    Troponin i, poc 5.23 (*)    All other components within normal limits  MAGNESIUM   URINALYSIS, ROUTINE W REFLEX MICROSCOPIC   Dg Chest Port 1 View  06/18/2012  *RADIOLOGY REPORT*  Clinical Data: Shortness of breath  PORTABLE CHEST - 1 VIEW  Comparison: 02/25/2009  Findings: Cardiomegaly and left AICD noted. Mild pulmonary vascular congestion is present. This is a low-volume film. There is no evidence of focal airspace disease, pulmonary edema, suspicious pulmonary nodule/mass, pleural effusion, or pneumothorax. No acute bony abnormalities are identified.  IMPRESSION: Cardiomegaly with mild pulmonary vascular congestion.   Original Report Authenticated By: Harmon Pier, M.D.      No diagnosis found.   Date: 06/18/2012 @ 0932  Rate: 190 bpm  Rhythm: ventricular tachycardia  QRS Axis: indeterminate  Intervals:    ST/T Wave abnormalities: ST elevations diffusely  Conduction Disutrbances:   Narrative Interpretation:  Wide complex tachycardia consistent with V-tach.  Old EKG Reviewed: changes noted   Date: 06/18/2012 @ 0956 after cardioversion  Rate: 90 bpm  Rhythm: sinus  QRS Axis: left  Intervals: PR prolonged  ST/T Wave abnormalities: nonspecific ST/T changes  Conduction Disutrbances:first-degree A-V block , [name]  Narrative Interpretation:   Old EKG Reviewed: changes noted        MDM  Pt presented for evaluation of dizziness.  Immediately on arrival I was called to the bedside.  Pt had a wide complex tachycardia consistent with V-tach.  She was alert, oriented, and hypotensive.  Initiated an IVF bolus and established 2 IV sites.  She did not respond to IVF.  She has an ICD that has delivered shocks previously for abnormal rhythms but she reports it had not fired today.  Administered a bolus of 150 mg amiodarone.  There was a transient rate reduction.  Started an amiodarone gtt.  Pt had a transient change in her mental status but quickly returned to her baseline.  I discussed my concerns that she is showing significant evidence of decompensation secondary to the  abnormal rhythm.  Administered a second amiodarone bolus.  Again, no response noted.  Airway adjuncts brought to the bedside.  Administered etomidate and fentanyl (10 mg and 50 mcg) as a procedural sedation.  Performed synchronous cardioversion - 1 shock at 120 joules.  She had an immediate return to a sinus rhythm.  Secondary to refractory hypotension, continued the IVF boluses.  Stopped the amiodarone gtt secondary to concerns that it may have been contributing to the hypotension.  She had some momentary apnea.  Her respirations were supported via jaw thrust and breaths delivered via the BVM.  Consults placed to the [facility] for admission and to the [facility] for pacer/defibrillator interrogation.  Pt soon thereafter demonstrated a normal respiratory drive with good air movement and a normal O2 sat.  The hypotension has persisted.  After discussion with the on-call cardiologist, initiated a levophed gtt through a peripheral IV.  Placed a right IJ TLC under U/S guidance.  Moved the levophed gtt to the central line.  Line placement confirmed as described in the procedure note.  1155.  Cardiology is at the bedside completing ICU admission orders.  The intensivist service has been consulted also by cardiology.  She remains altered, GCS 10.  At this time as she is maintaining her airway, it has been decided to not move towards intubation.      Tobin Chad, MD 06/18/12 1158

## 2012-06-18 NOTE — ED Notes (Signed)
MD at bedside; amniodarone infusing; pt pale but responsive. Phlebotomy at bedside and portable xray outside room when needed.

## 2012-06-18 NOTE — ED Notes (Signed)
Xray at bedside for portable.

## 2012-06-18 NOTE — ED Notes (Signed)
Titrated to 10 mcg/min levophed.

## 2012-06-18 NOTE — ED Notes (Addendum)
1 more liter of NS will be hung upon completion of 3rd per pulmonologist.

## 2012-06-18 NOTE — Consult Note (Addendum)
PULMONARY/CCM CONSULT NOTE  Requesting MD/Service: Rothbart/[facility] Date of admission: 2/16 Date of consult: 2/16 Reason for consultation: shock   Pt Profile:  41F with NICM and recurrent tachyarrhythmias admitted 2/16 via ED with weakness due to [facility], s/p elective cardioversion. Persistent hypotension after cardioversion    HPI:  41F with NICM and recurrent tachyarrhythmias admitted 2/16 via ED with weakness due to [facility], s/p elective cardioversion. Persistent hypotension after cardioversion. She is very lethargic and unable to provide further history   Past Medical History  Diagnosis Date  . Tachycardia   . Hypokalemia   . Pulmonary edema   . CHF (congestive heart failure)   . DVT (deep venous thrombosis)   . Dyslipidemia   . HTN (hypertension)   . Cardiomyopathy     nonischemic  . Ventricular tachycardia     with ICD  . Goiter     MEDICATIONS: reviewed  History   Social History  . Marital Status: Single    Spouse Name: N/A    Number of Children: N/A  . Years of Education: N/A   Occupational History  . 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 Smoking status: Never Smoker   . Smokeless tobacco: Not on file  . Alcohol Use: No  . Drug Use: No  . Sexually Active: Not on file   Other Topics Concern  . Not on file   Social History Narrative  . No narrative on file    Family History  Problem Relation Age of Onset  . Emphysema Mother   . Heart disease Mother   . Heart attack Father   . Emphysema Brother     ROS - unable to obtain  Filed Vitals:   06/18/12 1045 06/18/12 1100 06/18/12 1115 06/18/12 1133  BP: 71/53 48/26 66/24  85/52  Pulse: 59 70  71  Temp:      TempSrc:      Resp: 24 26 27    SpO2: 100% 100%  99%    EXAM:  Gen: lethargic, answers basic questions HEENT: NCAT, EOMI, PERRL Neck: + JVD, no LAN Lungs: clear anteriorly Cardiovascular: very distant HS, no M noted Abdomen: obese, soft, NT, diminished  BS Ext: cool, diminished distal pulses, delayed cap refil Neuro: No focal deficits  DATA:  BMET    Component Value Date/Time   NA 143 06/18/2012 0942   K 5.0 06/18/2012 0942   CL 107 06/18/2012 0942   CO2 17* 06/18/2012 0942   GLUCOSE 195* 06/18/2012 0942   BUN 27* 06/18/2012 0942   CREATININE 2.80* 06/18/2012 0942   CALCIUM 9.9 06/18/2012 0942   GFRNONAA 17* 06/18/2012 0942   GFRAA 19* 06/18/2012 0942    CBC    Component Value Date/Time   WBC 11.1* 06/18/2012 0942   RBC 4.33 06/18/2012 0942   HGB 13.1 06/18/2012 0942   HCT 40.2 06/18/2012 0942   PLT 151 06/18/2012 0942   MCV 92.8 06/18/2012 0942   MCH 30.3 06/18/2012 0942   MCHC 32.6 06/18/2012 0942   RDW 13.9 06/18/2012 0942   LYMPHSABS 1.9 06/18/2012 0942   MONOABS 0.5 06/18/2012 0942   EOSABS 0.0 06/18/2012 0942   BASOSABS 0.0 06/18/2012 0942    CXR: CM, mild interstitial prominence, no overt edema  EKG: initial - WCT, post cardioversion - NSR, [name]  ECHO: preliminary - EF approx 35%. Minimal effusion. RVSP approx 45 mmHg  IMPRESSION:   Principal Problem:   Cardiogenic shock Active Problems:   Cardiomyopathy, nonischemic  Ventricular tachycardia, sustained   AUTOMATIC IMPLANTABLE CARDIAC DEFIBRILLATOR SITU   Acute renal failure   Hyperglycemia, no prior hx of DM documented   PLAN:  Complete 4 liters NS for volume resuscitation Norepinephrine to maintain MAP > 60 mmHg Dobutamine 5 ug/kg/min DVT prophylaxis ordered SSI ordered  Please note that there is documentation of PMH of DVT in her records but there is no confirmatory evidence of this - there is an old note from pre-Epic days that lists DVT as an assessment but was actually referring to DVT prophylaxis. As far as I can tell she has never had a DVT. She has moderate PAH on current Echo but calculated RVSP is actually lower than previous echocardiograms so PE is not likely the cause of her shock.  Billy Fischer, MD ; [facility] [phone].  After 5:30 PM  or weekends, call [phone]   ADD:  I went back to see her a couple of hours after admission in the CCU. She is markedly better with improved cognition, increasing Uo and much better BP - weaning off NE. It seems that time and dobutamine have been very beneficial to her. She should be off vasopressors by AM 2/17. If so, PCCM will sign off. Please consider removal of R IJ CVL as soon as no longer needed.  Billy Fischer, MD ; [facility] [phone].  After 5:30 PM or weekends, call [phone]

## 2012-06-18 NOTE — ED Notes (Signed)
Shocked 120 J delivered at this time. PT in NS at 90

## 2012-06-18 NOTE — ED Notes (Signed)
Central line set up

## 2012-06-18 NOTE — ED Notes (Addendum)
Pt tolerated central line placement well. Xray called. [employer] at bedside for interrogation

## 2012-06-18 NOTE — ED Notes (Addendum)
Cardiologist at bedside. Amniodarone discontinued.

## 2012-06-18 NOTE — Progress Notes (Signed)
Pt unable to tolerate foley. Removed per pt request. Area cleaned. Pt attempted to urinate but unable. Bladder scanned and 100cc found. Dr Bard Herbert aware of pt unable to void at this time. States to continue to monitor

## 2012-06-18 NOTE — ED Notes (Signed)
MD continues to be at bedside to monitor; rep for [employer] called.

## 2012-06-18 NOTE — Progress Notes (Signed)
CRITICAL VALUE ALERT  Critical value received: trop of 9  Date of notification:  06/18/2012  Time of notification:  1420  Critical value read back:yes  Nurse who received alert:  C. Desmin Daleo  MD notified (1st page): Serpe  Time of first page:  1425  MD notified (2nd page):  Time of second page:  Responding MD: Shara Blazing  Time MD responded:  1430

## 2012-06-18 NOTE — H&P (Signed)
History and Physical  Patient ID: Kaitlin [name] MRN: [id number], SOB: Aug 30, 1945 67 y.o. Date of Encounter: 06/18/2012, 11:08 AM  Primary Physician: Evlyn Courier, MD Primary Cardiologist: Sharrell Ku, MD  HPI: Patient presented to ER in VT at 190 bpm. No ICD discharge. She was lethargic, pale with BP persistently in 60 systolic range. She was treated with 2 boluses of 150 mg Amiodarone, total of 2.5 L NS, and place on Levophed. She was subsequently successfully cardioverted with 200 joules, and has remained in NSR. However, she remains hypotensive and obtunded.  Past Medical History  Diagnosis Date  . Tachycardia   . Hypokalemia   . Pulmonary edema   . CHF (congestive heart failure)   . DVT (deep venous thrombosis)   . Dyslipidemia   . HTN (hypertension)   . Cardiomyopathy     nonischemic  . Ventricular tachycardia     with ICD  . Goiter    Most Recent Cardiac Studies: EF: 35% - 40%, echo, 01/2012; mild/mod MR; mod PHTN (RVSP 55 mmHg)      Surgical History:  Past Surgical History  Procedure Laterality Date  . None    . Cardiac defibrillator placement      Home Meds: Prior to Admission medications   Medication Sig Start Date End Date Taking? Authorizing Provider  acetaminophen (TYLENOL) 325 MG tablet Take 650 mg by mouth as needed.      Historical Provider, MD  aspirin 81 MG tablet Take 81 mg by mouth daily.      Historical Provider, MD  benazepril (LOTENSIN) 10 MG tablet TAKE ONE AND ONE-HALF TABLETS DAILY 04/25/12   Rollene Rotunda, MD  carvedilol (COREG) 25 MG tablet Take 1 tablet (25 mg total) by mouth 2 (two) times daily. 10/11/11   Rollene Rotunda, MD  fish oil-omega-3 fatty acids 1000 MG capsule Take 1 g by mouth daily.      Historical Provider, MD  furosemide (LASIX) 40 MG tablet TAKE 1 TABLET DAILY OR AS DIRECTED 03/25/12   Rollene Rotunda, MD  Multiple Vitamin (MULTIVITAMIN) capsule Take 1 capsule by mouth daily.      Historical Provider, MD  potassium  chloride (KLOR-CON) 20 MEQ packet Take 20 mEq by mouth 2 (two) times daily.      Historical Provider, MD  simvastatin (ZOCOR) 40 MG tablet Take 1 tablet (40 mg total) by mouth at bedtime. 10/11/11   Rollene Rotunda, MD   Allergies: No Known Allergies  History   Social History  . Marital Status: Single    Spouse Name: N/A    Number of Children: N/A  . Years of Education: N/A   Occupational History  . 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 Smoking status: Never Smoker   . Smokeless tobacco: Not on file  . Alcohol Use: No  . Drug Use: No  . Sexually Active: Not on file   Other Topics Concern  . Not on file   Social History Narrative  . No narrative on file     Family History  Problem Relation Age of Onset  . Emphysema Mother   . Heart disease Mother   . Heart attack Father   . Emphysema Brother    Review of Systems: unobtainable  Labs:   Lab Results  Component Value Date   WBC 11.1* 06/18/2012   HGB 13.1 06/18/2012   HCT 40.2 06/18/2012   MCV 92.8 06/18/2012   PLT 151 06/18/2012    Recent  Labs Lab 06/18/12 0942  NA 143  K 5.0  CL 107  CO2 17*  BUN 27*  CREATININE 2.80*  CALCIUM 9.9  PROT 6.6  BILITOT 1.7*  ALKPHOS 76  ALT 146*  AST 147*  GLUCOSE 195*   No results found for this basename: CKTOTAL, CKMB, TROPONINI,  in the last 72 hours Lab Results  Component Value Date   CHOL 153 04/14/2010   HDL 44.60 04/14/2010   LDLCALC 92 04/14/2010   TRIG 84.0 04/14/2010   Lab Results  Component Value Date   DDIMER 1.32 02/21/2009   Radiology/Studies:  Dg Chest Port 1 View  06/18/2012  *RADIOLOGY REPORT*  Clinical Data: Shortness of breath  PORTABLE CHEST - 1 VIEW  Comparison: 02/25/2009  Findings: Cardiomegaly and left AICD noted. Mild pulmonary vascular congestion is present. This is a low-volume film. There is no evidence of focal airspace disease, pulmonary edema, suspicious pulmonary nodule/mass, pleural effusion, or  pneumothorax. No acute bony abnormalities are identified.  IMPRESSION: Cardiomegaly with mild pulmonary vascular congestion.   Original Report Authenticated By: Harmon Pier, M.D.     EKG: NSR with 1st degree AVB 90 bpm; [name]  Physical Exam: Blood pressure 83/60, pulse 71, temperature 97.8 F (36.6 C), temperature source Oral, resp. rate 23, SpO2 97.00%. General: 67 yo female, morbidly obese, obtunded. Head: Normocephalic, atraumatic Neck: Negative for carotid bruits. JVD not elevated. Lungs: Clear bilaterally to auscultation without wheezes, rales, or rhonchi. Heart: RRR, diminished heart sounds. No murmurs, rubs, or gallops appreciated. Abdomen: Soft, non-tender, non-distended with normoactive bowel sounds. Msk:  No obvious deformities. Extremities: trace peripheral edema; extremities cool to touch. Neuro: obtunded. Psych:  unresponsive.  ASSESSMENT AND PLAN:  1 VT Cardiac Arrest  - Status post successful synchronized cardioversion (x1)  - VT at 190 bpm  2 Cardiogenic shock  - on NS/vasopressor support  3 NICM  - 35% - 40%, echo, 01/2012  4 h/o VT  - s/p ICD implantation  - s/p interrogation yielding NL function and VT threshold of 207 bpm  PLAN: Admit to ICU for continued close monitoring. STAT echo. CCM has been consulted. Dr Ladona Ridgel to see in AM, regarding adjustment of ICD parameters. Continue IVF at 100 cc/hr. Amiodarone DC'd secondary to hypotension. R/o MI. Follow lytes closely.  SERPE, EUGENE PA-C 06/18/2012, 11:08 AM  Cardiology Attending Patient interviewed and examined. Discussed with Gene Serpe, PA-C.  Above note annotated and modified based upon my findings.  Please see my separate and complete H&P  [place] Bing, MD 06/18/2012, 6:19 PM

## 2012-06-18 NOTE — ED Notes (Addendum)
Cardiology paged. PT still under and unresponsive; being bagged by MD at this time. Sating 98%.

## 2012-06-18 NOTE — Progress Notes (Signed)
Patient reassessed following transfer to CCU. She is now awake,, alert and appropriate. She denies any dyspnea. She had no signs nor symptoms of congestive heart failure prior to the onset of this arrhythmia.  Echocardiogram reveals deterioration in LV systolic function. This may have preceded arrhythmia or may be a result of prolonged tachycardia.  Troponin increased from 5 to 9 on the second determination. I doubt that she has suffered any significant myocardial damage related to this event were that it reflects preceding myocardial ischemia.  Urine output was modest prior to the need to remove the Foley as the result of patient discomfort. She almost certainly has a component of ATN related to prolonged hypotension. Now that she is awake, oral intake of fluids and nutrition should be normal. Rate of intravenous fluid administration will be decreased to avoid fluid overload.  Taper norepinepherine to off if tolerated.  Continue dobutamine for now.  Will need plan from EP re: frequent episodes of sustained VT.  [place] Bing, MD  06/18/2012 6:38 PM

## 2012-06-18 NOTE — ED Notes (Signed)
Arterial line set up at bedside for pulmonologist.

## 2012-06-18 NOTE — ED Notes (Signed)
Pulmonologist to place art line at this time.

## 2012-06-18 NOTE — ED Notes (Signed)
MD pushed etomidate. 2 RNs and PA's at bedside. Ambu bag and suction ready.

## 2012-06-18 NOTE — ED Notes (Signed)
Blood samples obtained.

## 2012-06-18 NOTE — ED Notes (Signed)
20 mcg/ min titrated up on levophed.

## 2012-06-18 NOTE — Progress Notes (Signed)
Utilization Review Completed.Kaitlin [name] T2/16/2014  

## 2012-06-18 NOTE — Progress Notes (Signed)
TED hose ordered per MD request. Pt refuses to use at this time. Pt states "I just want to sleep and be left alone for right now." Risks and Benefits explained to pt about TED hose. Pt verbalizes understanding and states she may use them later.

## 2012-06-18 NOTE — ED Notes (Signed)
[employer] rep adjusted AICD- 182 to fire. Documents on chart.

## 2012-06-18 NOTE — ED Notes (Signed)
Pt under at this time.

## 2012-06-18 NOTE — H&P (Signed)
Patient Name: Kaitlin [name]  MRN: [id number]  HPI: SHENETTA SCHNACKENBERG is an 67 y.o. female referred by Dr.Marwan Lubertha Sayres, MD from the emergency department after presenting with wide-complex tachycardia. She has a history of ventricular tachycardia and nonischemic cardiomyopathy with an AICD managed by Dr. Ladona Ridgel. She was seen approximately 2 weeks ago at which time episodes of tachycardia with rates of approximately 180 were triggering antitachycardia pacing. Her AICD was reprogrammed. She presented this morning with 12 hours of dizziness and now has was found to be in a wide-complex rhythm at a rate of 180. Amiodarone was administered without benefit prompting DC cardioversion. Since returning to an AV paced rhythm, patient has been hypotensive and obtunded. She had a relatively normal mental status on presentation and was able to provide appropriate history.  Basic laboratory studies in the ED revealed renal insufficiency with a creatinine of 2.8, probable mild acidosis with a CO2 of 17, a normal CBC except for a minimal leukocytosis, and a troponin in excess of 5.  Glucose was 195. Chest x-ray showed low lung volumes with left lower lobe infiltrate or atelectasis.  An echocardiogram is pending.  Past Medical History  Diagnosis Date  . Tachycardia   . Hypokalemia   . Pulmonary edema   . CHF (congestive heart failure)   . DVT (deep venous thrombosis)   . Dyslipidemia   . HTN (hypertension)   . Cardiomyopathy     nonischemic  . Ventricular tachycardia     with ICD  . Goiter    Past Surgical History  Procedure Laterality Date  . None    . Cardiac defibrillator placement     Family History  Problem Relation Age of Onset  . Emphysema Mother   . Heart disease Mother   . Heart attack Father   . Emphysema Brother    Social History:  reports that she has never smoked. She does not have any smokeless tobacco history on file. She reports that she does not drink alcohol or use illicit  drugs.  Allergies: No Known Allergies  Medications: I have reviewed the patient's current medications.  Results for orders placed during the hospital encounter of 06/18/12 (from the past 48 hour(s))  CBC WITH DIFFERENTIAL     Status: Abnormal   Collection Time    06/18/12  9:42 AM      Result Value Range   WBC 11.1 (*) 4.0 - 10.5 K/uL   RBC 4.33  3.87 - 5.11 MIL/uL   Hemoglobin 13.1  12.0 - 15.0 g/dL   HCT 40.9  81.1 - 91.4 %   MCV 92.8  78.0 - 100.0 fL   MCH 30.3  26.0 - 34.0 pg   MCHC 32.6  30.0 - 36.0 g/dL   RDW 78.2  95.6 - 21.3 %   Platelets 151  150 - 400 K/uL   Neutrophils Relative 79 (*) 43 - 77 %   Neutro Abs 8.8 (*) 1.7 - 7.7 K/uL   Lymphocytes Relative 17  12 - 46 %   Lymphs Abs 1.9  0.7 - 4.0 K/uL   Monocytes Relative 4  3 - 12 %   Monocytes Absolute 0.5  0.1 - 1.0 K/uL   Eosinophils Relative 0  0 - 5 %   Eosinophils Absolute 0.0  0.0 - 0.7 K/uL   Basophils Relative 0  0 - 1 %   Basophils Absolute 0.0  0.0 - 0.1 K/uL  COMPREHENSIVE METABOLIC PANEL     Status: Abnormal  Collection Time    06/18/12  9:42 AM      Result Value Range   Sodium 143  135 - 145 mEq/L   Potassium 5.0  3.5 - 5.1 mEq/L   Chloride 107  96 - 112 mEq/L   CO2 17 (*) 19 - 32 mEq/L   Glucose, Bld 195 (*) 70 - 99 mg/dL   BUN 27 (*) 6 - 23 mg/dL   Creatinine, Ser 1.61 (*) 0.50 - 1.10 mg/dL   Calcium 9.9  8.4 - 09.6 mg/dL   Total Protein 6.6  6.0 - 8.3 g/dL   Albumin 3.7  3.5 - 5.2 g/dL   AST 045 (*) 0 - 37 U/L   ALT 146 (*) 0 - 35 U/L   Alkaline Phosphatase 76  39 - 117 U/L   Total Bilirubin 1.7 (*) 0.3 - 1.2 mg/dL   GFR calc non Af Amer 17 (*) >90 mL/min   GFR calc Af Amer 19 (*) >90 mL/min   Comment:            The eGFR has been calculated     using the CKD EPI equation.     This calculation has not been     validated in all clinical     situations.     eGFR's persistently     <90 mL/min signify     possible Chronic Kidney Disease.  MAGNESIUM     Status: None   Collection Time     06/18/12  9:42 AM      Result Value Range   Magnesium 2.4  1.5 - 2.5 mg/dL  POCT I-STAT TROPONIN I     Status: Abnormal   Collection Time    06/18/12  9:56 AM      Result Value Range   Troponin i, poc 5.23 (*) 0.00 - 0.08 ng/mL   Comment NOTIFIED PHYSICIAN     Comment 3            Comment: Due to the release kinetics of cTnI,     a negative result within the first hours     of the onset of symptoms does not rule out     myocardial infarction with certainty.     If myocardial infarction is still suspected,     repeat the test at appropriate intervals.   Dg Chest Portable 06/18/2012  AICD on left; cardiomegaly with vascular congestion.  Low lung volumes.  Left lower lobe atelectasis or infiltrate is unchanged.   Review of Systems: Unobtainable  Physical Exam: Blood pressure 54/44, pulse 70, temperature 97.8 F (36.6 C), temperature source Oral, resp. rate 15, SpO2 98.00%.;  General-Well-developed; no respiratory distress; no apparent airway compromise HEENT-[place]/AT; PERRL; EOM intact; conjunctiva and lids nl Neck-No JVD; no carotid bruits Endocrine-No thyromegaly Lungs-Clear lung fields; resonant percussion; normal I-to-E ratio Cardiovascular- normal PMI; normal S1 and S2; grade 1-2/6 systolic murmur Abdomen-decreased BS; soft and non-tender without masses or organomegaly Musculoskeletal-No deformities, cyanosis or clubbing Neurologic-obtunded; responds to pain; conjugate eye movements; pupils midpoint, equal and round; nl cranial nerves; normal tone Skin- Warm, dry, no significant lesions Extremities-diminished distal pulses; no edema  Assessment/Plan:  Ventricular tachycardia:  Recurrent arrhythmia with hemodynamic compromise. Most of the impairment in neurologic function likely related to sedatives administered prior to cardioversion in the setting of renal insufficiency. AICD trigger for therapy will be set to a lower heart rate for now.  Patient may require antiarrhythmic  medication or ablative therapy, if appropriate.  Cardiomyopathy: No  evidence by exam or chest x-ray for clinical congestive heart failure. Appropriate medical therapy will be reintroduced once patient recovers from her acute event.  Elevated troponin: Likely the result of ventricular tachycardia and hypotension. Patient has no known coronary atherosclerosis and thus is extremely unlikely to suffer an acute coronary syndrome.  Acute renal failure-presumed: Almost certainly the result of hypoperfusion related to ventricular tachycardia and hypotension.  No recent assessments of renal function are available; however, creatinine was 1.3 when last measured in 03/2009.  Hypotension: Patient is requiring high-dose norepinephrine infusion. Hypertension will likely improve once she begins to recover from this acute.  She is also being given 3 L of normal saline, and there is concern for the development of pulmonary edema, especially in the absence of normal renal function.  [place] Bing, MD 06/18/2012, 1:15 PM

## 2012-06-18 NOTE — Progress Notes (Signed)
  Echocardiogram 2D Echocardiogram has been performed.  Kaitlin [name] 06/18/2012, 12:22 PM

## 2012-06-18 NOTE — ED Notes (Signed)
Abnormal labs given to Dr. Lorenso Courier

## 2012-06-18 NOTE — ED Notes (Signed)
PT bagged by MD to assist respirations; sating 98%

## 2012-06-18 NOTE — ED Notes (Signed)
Pt currently responsive. Moving all extremities and eyes open; responding to verbal stimuli.

## 2012-06-18 NOTE — ED Notes (Signed)
30 mcg/ min titrated levophed.

## 2012-06-19 ENCOUNTER — Encounter (HOSPITAL_COMMUNITY): Payer: Self-pay | Admitting: Internal Medicine

## 2012-06-19 ENCOUNTER — Inpatient Hospital Stay (HOSPITAL_COMMUNITY): Payer: Commercial Managed Care - PPO

## 2012-06-19 DIAGNOSIS — I447 Left bundle-branch block, unspecified: Secondary | ICD-10-CM | POA: Diagnosis present

## 2012-06-19 DIAGNOSIS — I472 Ventricular tachycardia: Secondary | ICD-10-CM | POA: Diagnosis present

## 2012-06-19 DIAGNOSIS — J811 Chronic pulmonary edema: Secondary | ICD-10-CM | POA: Diagnosis not present

## 2012-06-19 LAB — COMPREHENSIVE METABOLIC PANEL
Albumin: 3.4 g/dL — ABNORMAL LOW (ref 3.5–5.2)
BUN: 29 mg/dL — ABNORMAL HIGH (ref 6–23)
Chloride: 108 mEq/L (ref 96–112)
Creatinine, Ser: 2.48 mg/dL — ABNORMAL HIGH (ref 0.50–1.10)
GFR calc Af Amer: 23 mL/min — ABNORMAL LOW (ref >90)
Glucose, Bld: 113 mg/dL — ABNORMAL HIGH (ref 70–99)
Total Bilirubin: 0.9 mg/dL (ref 0.3–1.2)

## 2012-06-19 LAB — POCT I-STAT 3, ART BLOOD GAS (G3+)
Patient temperature: 98.6
pCO2 arterial: 34.7 mmHg — ABNORMAL LOW (ref 35.0–45.0)
pH, Arterial: 7.409 (ref 7.350–7.450)

## 2012-06-19 LAB — BASIC METABOLIC PANEL
Calcium: 8.4 mg/dL (ref 8.4–10.5)
GFR calc Af Amer: 22 mL/min — ABNORMAL LOW (ref >90)
GFR calc non Af Amer: 19 mL/min — ABNORMAL LOW (ref >90)
Glucose, Bld: 116 mg/dL — ABNORMAL HIGH (ref 70–99)
Sodium: 141 mEq/L (ref 135–145)

## 2012-06-19 LAB — GLUCOSE, CAPILLARY
Glucose-Capillary: 105 mg/dL — ABNORMAL HIGH (ref 70–99)
Glucose-Capillary: 108 mg/dL — ABNORMAL HIGH (ref 70–99)
Glucose-Capillary: 105 mg/dL — ABNORMAL HIGH (ref 70–99)

## 2012-06-19 LAB — PRO B NATRIURETIC PEPTIDE: Pro B Natriuretic peptide (BNP): 16784 pg/mL — ABNORMAL HIGH (ref 0–125)

## 2012-06-19 LAB — CARBOXYHEMOGLOBIN: Carboxyhemoglobin: 1 % (ref 0.5–1.5)

## 2012-06-19 LAB — HEPARIN LEVEL (UNFRACTIONATED): Heparin Unfractionated: 0.44 IU/mL (ref 0.30–0.70)

## 2012-06-19 LAB — CBC
MCH: 30.4 pg (ref 26.0–34.0)
MCHC: 33.2 g/dL (ref 30.0–36.0)
Platelets: 136 10*3/uL — ABNORMAL LOW (ref 150–400)
RBC: 4.05 MIL/uL (ref 3.87–5.11)

## 2012-06-19 LAB — PROTIME-INR
INR: 1.28 (ref 0.00–1.49)
Prothrombin Time: 15.7 seconds — ABNORMAL HIGH (ref 11.6–15.2)

## 2012-06-19 LAB — PHOSPHORUS: Phosphorus: 4 mg/dL (ref 2.3–4.6)

## 2012-06-19 MED ORDER — ASPIRIN 81 MG PO CHEW
CHEWABLE_TABLET | ORAL | Status: AC
  Filled 2012-06-19: qty 1

## 2012-06-19 MED ORDER — ASPIRIN EC 81 MG PO TBEC
81.0000 mg | DELAYED_RELEASE_TABLET | Freq: Every day | ORAL | Status: DC
  Administered 2012-06-19 – 2012-06-27 (×8): 81 mg via ORAL
  Filled 2012-06-19 (×10): qty 1

## 2012-06-19 MED ORDER — HEPARIN (PORCINE) IN NACL 100-0.45 UNIT/ML-% IJ SOLN
900.0000 [IU]/h | INTRAMUSCULAR | Status: DC
  Administered 2012-06-19 – 2012-06-20 (×2): 850 [IU]/h via INTRAVENOUS
  Filled 2012-06-19 (×3): qty 250

## 2012-06-19 MED ORDER — HEPARIN BOLUS VIA INFUSION
3500.0000 [IU] | Freq: Once | INTRAVENOUS | Status: AC
  Administered 2012-06-19: 3500 [IU] via INTRAVENOUS
  Filled 2012-06-19: qty 3500

## 2012-06-19 MED ORDER — DOBUTAMINE IN D5W 4-5 MG/ML-% IV SOLN
2.5000 ug/kg/min | INTRAVENOUS | Status: DC
  Administered 2012-06-20: 2.5 ug/kg/min via INTRAVENOUS
  Administered 2012-06-20: 5 ug/kg/min via INTRAVENOUS

## 2012-06-19 MED ORDER — FUROSEMIDE 10 MG/ML IJ SOLN
INTRAMUSCULAR | Status: AC
  Administered 2012-06-19: 40 mg via INTRAVENOUS
  Filled 2012-06-19: qty 4

## 2012-06-19 MED ORDER — FUROSEMIDE 10 MG/ML IJ SOLN
40.0000 mg | Freq: Two times a day (BID) | INTRAMUSCULAR | Status: DC
  Administered 2012-06-19 – 2012-06-21 (×6): 40 mg via INTRAVENOUS
  Filled 2012-06-19 (×8): qty 4

## 2012-06-19 MED ORDER — LIDOCAINE HCL 2 % EX GEL
Freq: Once | CUTANEOUS | Status: DC
  Filled 2012-06-19: qty 5

## 2012-06-19 NOTE — Progress Notes (Signed)
Troponin elevated to >20; EKG ordered stat;   I asked nursing staff to inform cardiology;

## 2012-06-19 NOTE — Progress Notes (Signed)
PULMONARY/CCM CONSULT NOTE  Requesting MD/Service: Rothbart/[facility] Cards Date of admission: 2/16 Date of consult: 2/16 Reason for consultation: shock   Pt Profile:  52F with NICM and recurrent tachyarrhythmias admitted 2/16 via ED with weakness due to [facility], s/p elective cardioversion. Persistent hypotension after cardioversion    SUBJ:  Much improved this AM. Weaning off NE. Still on DBA. Subsequently developed increased resp distress > CXR c/w edema. Placed on NPPV per Cards  Filed Vitals:   06/19/12 1500 06/19/12 1600 06/19/12 1700 06/19/12 1800  BP: 115/69 119/63 114/70 102/50  Pulse: 87 94 106 100  Temp:  98.6 F (37 C)    TempSrc:  Oral    Resp: 22 19 36 33  Height:      Weight:      SpO2: 99% 100% 96% 100%    EXAM:  Gen: RASS 0, fully oriented HEENT: NCAT, EOMI, PERRL Neck: + JVD, no LAN Lungs: bilateral rales Cardiovascular: very distant HS, no M noted Abdomen: obese, soft, NT, diminished BS Ext: warm s edema Neuro: No focal deficits  DATA:  BMET    Component Value Date/Time   NA 141 06/19/2012 0335   NA 141 06/19/2012 0335   K 4.2 06/19/2012 0335   K 4.1 06/19/2012 0335   CL 110 06/19/2012 0335   CL 108 06/19/2012 0335   CO2 21 06/19/2012 0335   CO2 19 06/19/2012 0335   GLUCOSE 116* 06/19/2012 0335   GLUCOSE 113* 06/19/2012 0335   BUN 29* 06/19/2012 0335   BUN 29* 06/19/2012 0335   CREATININE 2.49* 06/19/2012 0335   CREATININE 2.48* 06/19/2012 0335   CALCIUM 8.4 06/19/2012 0335   CALCIUM 8.6 06/19/2012 0335   GFRNONAA 19* 06/19/2012 0335   GFRNONAA 19* 06/19/2012 0335   GFRAA 22* 06/19/2012 0335   GFRAA 23* 06/19/2012 0335    CBC    Component Value Date/Time   WBC 19.3* 06/19/2012 0335   RBC 4.05 06/19/2012 0335   HGB 12.3 06/19/2012 0335   HCT 37.1 06/19/2012 0335   PLT 136* 06/19/2012 0335   MCV 91.6 06/19/2012 0335   MCH 30.4 06/19/2012 0335   MCHC 33.2 06/19/2012 0335   RDW 13.9 06/19/2012 0335   LYMPHSABS 1.9 06/18/2012 0942   MONOABS 0.5 06/18/2012 0942   EOSABS 0.0 06/18/2012 0942   BASOSABS 0.0 06/18/2012 0942    CXR: CM, increased interstitial edema  ECHO: EF 15%  IMPRESSION:   Principal Problem:   Cardiogenic shock Active Problems:   Cardiomyopathy, nonischemic   SVT (supraventricular tachycardia)   AUTOMATIC IMPLANTABLE CARDIAC DEFIBRILLATOR SITU   Acute renal failure   Hyperglycemia, no prior hx of DM documented   [name] (left bundle branch block)   Wide-complex tachycardia Pulmonary edema  PLAN:  Cont NE and DBA Cont BiPAP as needed Diuresis as ordered by Cards High risk of intubation - will cont to look in on her daily until she is liberated from BiPAP  Billy Fischer, MD ; [facility] [phone].  After 5:30 PM or weekends, call [phone]

## 2012-06-19 NOTE — Progress Notes (Signed)
Patient ID: Kaitlin [name], female   DOB: 09-15-1945, 67 y.o.   MRN: [id number]   [facility]  Called by nurse due to troponin >20.  Patient was admitted for VT and has been hypotensive requiring Dobutamine and Norepi.  She has a recent echo showing LVEF 15% which is significantly reduced compared to prior echo from 01/2012 when she had LVEF 35%.  A review of her chart shows that she had a left heart cath 01/2009 which showed no evidence of angiographically significant CAD.  She is currently asymptomatic and is without chest pain or shortness of breath at rest. Her serum creatinine is currently 2.8.  Her elevated cardiac troponin is probably secondary to VT and hypotension. Doubt obstructive CAD. Patient is not willing to undergo cardiac cath due to fear of worsening renal function. We will continue patient on current medical therapy and continue to observe on telemetry. BP 118/67  Gemma Payor, MD.

## 2012-06-19 NOTE — Progress Notes (Signed)
Alerted as to situation  Echo EF 15% with WMA  i think with Tn>20 the better part of valor may be to presume ischemic commponent and treat with heparin.  Will start

## 2012-06-19 NOTE — Progress Notes (Signed)
ANTICOAGULATION CONSULT NOTE - Initial Consult  Pharmacy Consult for heparin Indication: chest pain/ACS  No Known Allergies  Patient Measurements: Height: 5\' 3"  (160 cm) Weight: 189 lb 2.5 oz (85.8 kg) IBW/kg (Calculated) : 52.4 Heparin Dosing Weight: 71.6kg  Vital Signs: Temp: 98.2 F (36.8 C) (02/17 1300) Temp src: Axillary (02/17 1300) BP: 105/59 mmHg (02/17 1300) Pulse Rate: 83 (02/17 1300)  Labs:  Recent Labs  06/18/12 0942 06/18/12 1323 06/18/12 1340 06/19/12 0025 06/19/12 0335  HGB 13.1  --  12.8  --  12.3  HCT 40.2  --  37.8  --  37.1  PLT 151  --  124*  --  136*  CREATININE 2.80*  --  2.72*  --  2.49*  2.48*  TROPONINI  --  9.87*  --  >20.00*  --     Estimated Creatinine Clearance: 23.7 ml/min (by C-G formula based on Cr of 2.49).   Medical History: Past Medical History  Diagnosis Date  . Paroxysmal supraventricular tachycardia   . Hypokalemia   . Pulmonary edema   . Chronic systolic heart failure   . DVT (deep venous thrombosis)   . Dyslipidemia   . HTN (hypertension)   . Cardiomyopathy     nonischemic  . Ventricular tachycardia     with ICD  . Goiter   . Implantable cardiac defibrillator- Biotronik     Single chamber    Assessment: 43 YOF who was admitted with wide-complex tachycardia and has a history of VTach and has an AICD. Adjustments were made to AICD, but troponin was >20. Pharmacy now consulted to dose heparin for ACS/STEMI. Was on Lovenox for prophylaxis, last dose given last night around 1800. H/H stable, platelets slightly low at 136. No bleeding noted.  Goal of Therapy:  Heparin level 0.3-0.7 units/ml Monitor platelets by anticoagulation protocol: Yes   Plan:  1. Stat PT/INR and aPTT 2. Give 3500 unit heparin bolus 3. Start heparin drip at 850 units/hr 4. 8 hour heparin level 5. Daily CBC/HL while on therapy  Calel Pisarski D. Ireland Virrueta, PharmD Clinical Pharmacist Pager: [phone] 06/19/2012 2:07 PM

## 2012-06-19 NOTE — Consult Note (Signed)
ELECTROPHYSIOLOGY CONSULT NOTE  Patient ID: Kaitlin [name], MRN: [id number], DOB/AGE: January 26, 1946 67 y.o. Admit date: 06/18/2012 Date of Consult: 06/19/2012  Primary Physician: Evlyn Courier, MD Primary Cardiologist [facility]   Chief Complaint: widecomplex tachycardia   HPI Kaitlin [name] is a 66 y.o. female  Admitted because of sustained wide complex tachycardia thought to be ventricular in origin in the context of nonischemic cardiomyopathy and previously implanted ICD.  She's had multiple episodes, and she had seen Dr. Leonia Reeves just a couple of weeks ago who because of concerns about inappropriate shocks for SVT and activated the lower rate zone 176-200.  She is marked functional limitations and is short of breath walking across the room. Interestingly, she does not have edema nocturnal dyspnea or orthopnea  She denies sleep disorder breathing or daytime somnolence.        Past Medical History  Diagnosis Date  . Paroxysmal supraventricular tachycardia   . Hypokalemia   . Pulmonary edema   . Chronic systolic heart failure   . DVT (deep venous thrombosis)   . Dyslipidemia   . HTN (hypertension)   . Cardiomyopathy     nonischemic  . Ventricular tachycardia     with ICD  . Goiter   . Implantable cardiac defibrillator- Biotronik     Single chamber      Surgical History:  Past Surgical History  Procedure Laterality Date  . None    . Cardiac defibrillator placement       Home Meds: Prior to Admission medications   Medication Sig Start Date End Date Taking? Authorizing Provider  acetaminophen (TYLENOL) 325 MG tablet Take 650 mg by mouth every 6 (six) hours as needed for pain.   Yes Historical Provider, MD  aspirin EC 81 MG tablet Take 81 mg by mouth daily.   Yes Historical Provider, MD  benazepril (LOTENSIN) 10 MG tablet Take 15 mg by mouth daily.   Yes Historical Provider, MD  carvedilol (COREG) 25 MG tablet Take 25 mg by mouth 2 (two) times daily with a meal.   Yes  Historical Provider, MD  Cyanocobalamin (B-12 PO) Take 1 tablet by mouth daily.   Yes Historical Provider, MD  furosemide (LASIX) 40 MG tablet Take 40 mg by mouth daily.   Yes Historical Provider, MD  Multiple Vitamin (MULTIVITAMIN WITH MINERALS) TABS Take 1 tablet by mouth daily.   Yes Historical Provider, MD  omega-3 acid ethyl esters (LOVAZA) 1 G capsule Take 2 g by mouth 2 (two) times daily.   Yes Historical Provider, MD  potassium chloride SA (K-DUR,KLOR-CON) 20 MEQ tablet Take 20 mEq by mouth daily.   Yes Historical Provider, MD  simvastatin (ZOCOR) 40 MG tablet Take 40 mg by mouth at bedtime.   Yes Historical Provider, MD    Inpatient Medications:  . aspirin EC  81 mg Oral Daily  . enoxaparin (LOVENOX) injection  30 mg Subcutaneous Q24H  . insulin aspart  0-15 Units Subcutaneous TID WC  . sodium chloride  500 mL Intravenous Once    Allergies: No Known Allergies  History   Social History  . Marital Status: Single    Spouse Name: N/A    Number of Children: N/A  . Years of Education: N/A   Occupational History  . 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 Smoking status: Never Smoker   . Smokeless tobacco: Not on file  . Alcohol Use: No  . Drug Use: No  . Sexually Active:  Not on file   Other Topics Concern  . Not on file   Social History Narrative  . No narrative on file     Family History  Problem Relation Age of Onset  . Emphysema Mother   . Heart disease Mother   . Heart attack Father   . Emphysema Brother      ROS:  Please see the history of present illness.   Negative except  glaucoma not yet on therapy  All other systems reviewed and negative.    Physical Exam:  Blood pressure 82/59, pulse 106, temperature 98 F (36.7 C), temperature source Oral, resp. rate 30, height 5\' 3"  (1.6 m), weight 189 lb 2.5 oz (85.8 kg), SpO2 98.00%. General: Well developed, well nourished female in no acute distress. Head: Normocephalic,  atraumatic, sclera non-icteric, no xanthomas, nares are without discharge. Lymph Nodes:  none Back: without scoliosis/kyphosis, no CVA tendersness Neck: Negative for carotid bruits. JVD not elevated. Lungs: Clear bilaterally to auscultation without wheezes, rales, or rhonchi. Breathing is unlabored. Heart: RRR with S1 S2. 2/6  murmur , rubs, or gallops appreciated. Abdomen: Soft, non-tender, non-distended with normoactive bowel sounds. No hepatomegaly. No rebound/guarding. No obvious abdominal masses. Msk:  Strength and tone appear normal for age. Extremities: No clubbing or cyanosis. No  edema.  Distal pedal pulses are 2+ and equal bilaterally. Skin: Warm and Dry Neuro: Alert and oriented X 3. CN III-XII intact Grossly normal sensory and motor function . Psych:  Responds to questions appropriately with a normal affect.      Labs: Cardiac Enzymes  Recent Labs  06/18/12 1323 06/19/12 0025  TROPONINI 9.87* >20.00*   CBC Lab Results  Component Value Date   WBC 19.3* 06/19/2012   HGB 12.3 06/19/2012   HCT 37.1 06/19/2012   MCV 91.6 06/19/2012   PLT 136* 06/19/2012   PROTIME: No results found for this basename: LABPROT, INR,  in the last 72 hours Chemistry   Recent Labs Lab 06/19/12 0335  NA 141  141  K 4.2  4.1  CL 110  108  CO2 21  19  BUN 29*  29*  CREATININE 2.49*  2.48*  CALCIUM 8.4  8.6  PROT 5.6*  BILITOT 0.9  ALKPHOS 70  ALT 948*  AST 838*  GLUCOSE 116*  113*   Lipids Lab Results  Component Value Date   CHOL 153 04/14/2010   HDL 44.60 04/14/2010   LDLCALC 92 04/14/2010   TRIG 84.0 04/14/2010   BNP Pro B Natriuretic peptide (BNP)  Date/Time Value Range Status  02/21/2009 10:40 PM 176.0* 0.0 - 100.0 pg/mL Final  05/14/2007  5:00 AM 235.0*  Final  05/13/2007 12:07 AM 257.0*  Final   Miscellaneous Lab Results  Component Value Date   DDIMER  Value: 1.32        AT THE INHOUSE ESTABLISHED CUTOFF VALUE OF 0.48 ug/mL FEU, THIS ASSAY HAS BEEN  DOCUMENTED IN THE LITERATURE TO HAVE A SENSITIVITY AND NEGATIVE PREDICTIVE VALUE OF AT LEAST 98 TO 99%.  THE TEST RESULT SHOULD BE CORRELATED WITH AN ASSESSMENT OF THE CLINICAL PROBABILITY OF DVT / VTE.* 02/21/2009    Radiology/Studies:  Dg Chest Port 1 View  06/19/2012  *RADIOLOGY REPORT*  Clinical Data: Respiratory failure  PORTABLE CHEST - 1 VIEW  Comparison: 06/18/2012  Findings: Stable positioning of jugular central line.  Stable cardiomegaly.  Lungs show slight decrease in volumes with scattered atelectasis present bilaterally.  No overt pulmonary edema or visible pleural fluid.  IMPRESSION:  Decrease in lung volumes with scattered bilateral atelectasis.   Original Report Authenticated By: Irish Lack, M.D.    Dg Chest Portable 1 View  06/18/2012  *RADIOLOGY REPORT*  Clinical Data: Central line.  PORTABLE CHEST - 1 VIEW  Comparison: 06/18/2012  Findings: Left AICD remains in place, unchanged.  Right central line in place.  The tip is near the cavoatrial junction.  No pneumothorax.  Cardiomegaly with vascular congestion.  Low lung volumes.  Left lower lobe atelectasis or infiltrate is unchanged.  IMPRESSION: Right central line tip near the cavoatrial junction without pneumothorax.  Otherwise no real change.   Original Report Authenticated By: Charlett Nose, M.D.    Dg Chest Port 1 View  06/18/2012  *RADIOLOGY REPORT*  Clinical Data: Shortness of breath  PORTABLE CHEST - 1 VIEW  Comparison: 02/25/2009  Findings: Cardiomegaly and left AICD noted. Mild pulmonary vascular congestion is present. This is a low-volume film. There is no evidence of focal airspace disease, pulmonary edema, suspicious pulmonary nodule/mass, pleural effusion, or pneumothorax. No acute bony abnormalities are identified.  IMPRESSION: Cardiomegaly with mild pulmonary vascular congestion.   Original Report Authenticated By: Harmon Pier, M.D.     EKG: wide complex tachycardia [name] neg precordial concordance Rapid  downstroke  NSR [name] neg concordance with notching in the inferior leads that are present in the [facility]  AP Principal Problem:   Cardiogenic shock Active Problems:   SVT (supraventricular tachycardia)   Wide-complex tachycardia   Cardiomyopathy, nonischemic   [name] (left bundle branch block)   AUTOMATIC IMPLANTABLE CARDIAC DEFIBRILLATOR SITU   Acute renal failure   Hyperglycemia, no prior hx of DM documented       Assessment and Plan:   The patient's tachycardia is almost certainly SVT. We will reprogram the device to provide antitachycardia pacing as this had been largely successful in the past. I would concur with Dr. Ladona Ridgel last thoughts that catheter ablation is an appropriate step; however, at this juncture with her being on pressors, recovering from this is her first priority. The device will provide ATP to attempt to terminate the recurrent tachycardia.  Notably also she has intermittent left bundle branch block and class 3-4 heart failure. I would recommend evaluation by the heart failure service and consideration of CRT upgrade if the left bundle branch block recurs and becomes persistent. I have reviewed this with the family.  The patient's daughter, a doctor of education at [employer] state, also raises the question about the role of transplantation. Her functional capacity is quite limited and apparently somewhat denied by the patient. Functional assessment with 6 minute walk testing and consideration of right heart catheterization upon recovery would be appropriate  The troponin of 20 is really quite in excess of what we normally see with SVT. It might well derived from the shock state. Her renal function is quite impaired currently and upon his recovery consideration should also be given 2 left heart catheterization  Renal function is not normal; the last creatinine measured prior to this hospitalization is 2010  Once she is hemodynamically stable, consideration should  be given to the use of hydralazine/nitroglycerin  Sherryl Manges

## 2012-06-19 NOTE — Progress Notes (Signed)
Patient reexamined.  Clinically not much change from this am Poor inspitory effort.  Will check ABH and venous sat from distal port of triple lumen.  Likely high wedge low output.  Continue lasix and bipap.  Already on dobutamine and dont want to start milrinone due to VT/SVT and low BP.  Have asked CHF team to see and as per SK; s note upgrade to BiV CRT would seem beneficial.  Consider right heart cath once stable and off drips.  Discussed with both daughters and indicated possibility of needing intubation in near future  [facility]

## 2012-06-19 NOTE — Progress Notes (Addendum)
Called to see pt re: SOB  Pt had been doing OK this am but was becoming more SOB. CVP measured and was 18. SBP 120s so RN was weaning Levo but it is not off yet. Still on DBA.  Pt c/o gradually increasing SOB today. She wants to treat her SOB but requests no foley as she had pain with one previously. On exam, RR is elevated > 28, O2 sats 96% on O2, decreased basilar breath sounds bilat with crackles and JVP elevated. Will give Lasix 40 mg IV now and continue with BID dosing. Follow resp status and renal function closely.  Called back because pt sats decreased 88%. Had put out 200 cc urine, still refusing foley. Will start BiPAP and check CO-OX. If no improvement soon, will need ETT. Insert foley.

## 2012-06-19 NOTE — Progress Notes (Signed)
Patient Name: Kaitlin [name] Date of Encounter: 06/19/2012     Principal Problem:   Cardiogenic shock Active Problems:   Cardiomyopathy, nonischemic   Ventricular tachycardia, sustained   AUTOMATIC IMPLANTABLE CARDIAC DEFIBRILLATOR SITU   Acute renal failure   Hyperglycemia, no prior hx of DM documented    SUBJECTIVE - patient reports feeling better. No chest pain. Has mild SOB and mild dry cough. No fever or chills. - tropon 5-->9.87-->20.00 - Cre 2.80->2.48 - Urine 1541 ml - 2D Echo: EF 35%-40% on 01/17/12--> 15% on this admission,myocardium.  CURRENT MEDS Scheduled Meds: . enoxaparin (LOVENOX) injection  30 mg Subcutaneous Q24H  . insulin aspart  0-15 Units Subcutaneous TID WC  . sodium chloride  500 mL Intravenous Once   Continuous Infusions: . sodium chloride 30 mL/hr at 06/18/12 1900  . DOBUTamine 5 mcg/kg/min (06/18/12 1330)  . norepinephrine (LEVOPHED) Adult infusion 15 mcg/min (06/19/12 0617)   PRN Meds:.acetaminophen, nitroGLYCERIN, ondansetron (ZOFRAN) IV  OBJECTIVE  Filed Vitals:   06/19/12 0330 06/19/12 0400 06/19/12 0430 06/19/12 0500  BP: 110/68 119/63 120/52   Pulse: 79 86 79   Temp:      TempSrc:      Resp: 19 21 18    Height:      Weight:    189 lb 2.5 oz (85.8 kg)  SpO2: 98% 98% 97%     Intake/Output Summary (Last 24 hours) at 06/19/12 0618 Last data filed at 06/19/12 0500  Gross per 24 hour  Intake 1541.6 ml  Output    100 ml  Net 1441.6 ml   CVP:     Filed Weights   06/18/12 1300 06/19/12 0500  Weight: 185 lb 3 oz (84 kg) 189 lb 2.5 oz (85.8 kg)    PHYSICAL EXAM  General: Pleasant, NAD. Neuro: Alert and oriented X 3. Moves all extremities spontaneously. Psych: Normal affect. HEENT:  Normal  Neck: Supple without bruits or JVD. Lungs:  Resp regular and unlabored, CTA. Heart: RRR no s3, s4, or murmurs. Abdomen: Soft, non-tender, non-distended, BS + x 4.  Extremities: No clubbing, cyanosis or edema. DP/PT/Radials 2+  and equal bilaterally.  Accessory Clinical Findings  CBC  Recent Labs  06/18/12 0942 06/18/12 1340 06/19/12 0335  WBC 11.1* 14.1* 19.3*  NEUTROABS 8.8*  --   --   HGB 13.1 12.8 12.3  HCT 40.2 37.8 37.1  MCV 92.8 93.1 91.6  PLT 151 124* 136*   Basic Metabolic Panel  Recent Labs  06/18/12 0942 06/18/12 1340 06/19/12 0335  NA 143  --  141  141  K 5.0  --  4.2  4.1  CL 107  --  110  108  CO2 17*  --  21  19  GLUCOSE 195*  --  116*  113*  BUN 27*  --  29*  29*  CREATININE 2.80* 2.72* 2.49*  2.48*  CALCIUM 9.9  --  8.4  8.6  MG 2.4 1.8 1.9  PHOS  --   --  4.0     Recent Labs Lab 06/18/12 0942 06/18/12 1340 06/19/12 0335  NA 143  --  141  141  K 5.0  --  4.2  4.1  CL 107  --  110  108  CO2 17*  --  21  19  GLUCOSE 195*  --  116*  113*  BUN 27*  --  29*  29*  CREATININE 2.80* 2.72* 2.49*  2.48*  CALCIUM 9.9  --  8.4  8.6  MG 2.4 1.8 1.9  PHOS  --   --  4.0     Liver Function Tests  Recent Labs  06/18/12 0942 06/19/12 0335  AST 147* 838*  ALT 146* 948*  ALKPHOS 76 70  BILITOT 1.7* 0.9  PROT 6.6 5.6*  ALBUMIN 3.7 3.4*   No results found for this basename: LIPASE, AMYLASE,  in the last 72 hours Cardiac Enzymes  Recent Labs  06/18/12 1323 06/19/12 0025  TROPONINI 9.87* >20.00*   BNP No components found with this basename: POCBNP,  D-Dimer No results found for this basename: DDIMER,  in the last 72 hours Hemoglobin A1C No results found for this basename: HGBA1C,  in the last 72 hours Fasting Lipid Panel No results found for this basename: CHOL, HDL, LDLCALC, TRIG, CHOLHDL, LDLDIRECT,  in the last 72 hours Thyroid Function Tests  Recent Labs  06/18/12 1340  TSH 1.641    TELE: sinus rhythm with frequent PVC  ECG:   2D-Echo 06/18/12:  - Left ventricle: The cavity size was mildly dilated. Wall   thickness was normal. Systolic function was severely   reduced. The estimated ejection fraction was 15%. Moderate   to  severe hypokinesis of the anterolateral and   inferolateral myocardium. Akinesis of the inferior   myocardium. - Ventricular septum: Septal motion showed paradox. - Aortic valve: Mildly calcified annulus. - Mitral valve: Calcified annulus. Mild to moderate   regurgitation. - Left atrium: The atrium was mildly dilated. - Right ventricle: The cavity size was moderately dilated.   Systolic function was mildly to moderately reduced. - Right atrium: The atrium was mildly to moderately dilated. - Atrial septum: No defect or patent foramen ovale was   identified. - Tricuspid valve: Moderate regurgitation. - Pulmonary arteries: PA peak pressure: 46mm Hg (S).   Radiology/Studies  Dg Chest Portable 1 View  06/18/2012  *RADIOLOGY REPORT*  Clinical Data: Central line.  PORTABLE CHEST - 1 VIEW  Comparison: 06/18/2012  Findings: Left AICD remains in place, unchanged.  Right central line in place.  The tip is near the cavoatrial junction.  No pneumothorax.  Cardiomegaly with vascular congestion.  Low lung volumes.  Left lower lobe atelectasis or infiltrate is unchanged.  IMPRESSION: Right central line tip near the cavoatrial junction without pneumothorax.  Otherwise no real change.   Original Report Authenticated By: Charlett Nose, M.D.    Dg Chest Port 1 View  06/18/2012  *RADIOLOGY REPORT*  Clinical Data: Shortness of breath  PORTABLE CHEST - 1 VIEW  Comparison: 02/25/2009  Findings: Cardiomegaly and left AICD noted. Mild pulmonary vascular congestion is present. This is a low-volume film. There is no evidence of focal airspace disease, pulmonary edema, suspicious pulmonary nodule/mass, pleural effusion, or pneumothorax. No acute bony abnormalities are identified.  IMPRESSION: Cardiomegaly with mild pulmonary vascular congestion.   Original Report Authenticated By: Harmon Pier, M.D.     ASSESSMENT AND PLAN  Patient is 67 y.o. Lady with PMH VT and NICM, AICD, who was dimwitted b/c 12 hours of dizziness  and was found to have VT with HR up to 190/min. She was converted to sinus rhythm after amiodarone. But she has persistent hypotension. Her troponin is up to 5-->9.87-->20.00. Cre up to 2.80->2.48. 2D Echo showed worsening EF from 35%-40% to 15%. Chest x-ray showed low lung volumes with left lower lobe infiltrate or atelectasis. An echocardiogram is pending.  #: Ventricular tachycardia with hemodynamic compromise:  currently sinus rhythm with frequent PVC. Still on dobutamine gtt and levophed gtt.  Bp is stable.  -continue vasopressor gtt -May add amiodarone  -EP is to see patient  #:Hypotension: patient is on dobutamine gtt and levophed gtt.  -will be careful in hydration given EF of 15% to avoid pulmonary edema and acute renal failure. - will get CVP  # Elevated troponin: it is most likely due to ventricular tachycardia and hypotension. Renal failure may have also contributed. Patient had a card cath on 02/2011, which did not show CAD. Currently, patient is free of chest pain, but patient can have atypical presentation given her female gender and old age.  -Will observe closely and trend troponin -May consider to repeat cardia cath after acute event improves.  # Cardiomyopathy: EF 15% on this admission which is worse than previous one on 9/13. No signs of acute CHF exacerbation.  -will restart ASA -Hold home meds: coreg, Lotensin, lasix in the setting of hypotension.  # Acute renal failure-presumed: Is most likely the result of hypoperfusion 2/2 to hypotension. Cre is improving.  -will follow up BMP  Recent Labs Lab 06/18/12 0942 06/18/12 1340 06/19/12 0335  CREATININE 2.80* 2.72* 2.49*  2.48*   #: DVT PPx: Lovenox Byng   Lorretta Harp, MD PGY2, Internal Medicine Teaching Service Pager: [phone]  Patient examined chart reviewed.  Not clear why AICD did not fire for VT at 190 bpm.  Slow VT zone was programed for rate 176.  Still with some shock.  Continue pressors.  Check BNP.  Cr too  high to consider cath.  Discussed with Dr Graciela Husbands who will see.  No iv amiodarone for now.  Appears that [employer] rep has seen patient and added/changed VT1 to rate 182 with 3 bursts and two ramp ATP's  Discussed care with daughter.  May resume diet.

## 2012-06-19 NOTE — Progress Notes (Signed)
Rhonda Barrett PA notified of CVP and pt's C/O SOB.

## 2012-06-19 NOTE — Progress Notes (Signed)
ANTICOAGULATION CONSULT NOTE - Follow Up Consult  Pharmacy Consult for heparin Indication: chest pain/ACS  Labs:  Recent Labs  06/18/12 0942 06/18/12 1323 06/18/12 1340 06/19/12 0025 06/19/12 0335 06/19/12 1520 06/19/12 2230  HGB 13.1  --  12.8  --  12.3  --   --   HCT 40.2  --  37.8  --  37.1  --   --   PLT 151  --  124*  --  136*  --   --   APTT  --   --   --   --   --  30  --   LABPROT  --   --   --   --   --  15.7*  --   INR  --   --   --   --   --  1.28  --   HEPARINUNFRC  --   --   --   --   --   --  0.44  CREATININE 2.80*  --  2.72*  --  2.49*  2.48*  --   --   TROPONINI  --  9.87*  --  >20.00*  --   --   --     Assessment/Plan:  67yo female therapeutic on heparin with initial dosing for ACS.  Will continue gtt at current rate and confirm stable with am labs.  Colleen Can PharmD BCPS 06/19/2012,11:33 PM

## 2012-06-20 DIAGNOSIS — I498 Other specified cardiac arrhythmias: Principal | ICD-10-CM

## 2012-06-20 LAB — URINALYSIS, ROUTINE W REFLEX MICROSCOPIC
Glucose, UA: NEGATIVE mg/dL
Hgb urine dipstick: NEGATIVE
Leukocytes, UA: NEGATIVE
pH: 5.5 (ref 5.0–8.0)

## 2012-06-20 LAB — BASIC METABOLIC PANEL
Creatinine, Ser: 1.59 mg/dL — ABNORMAL HIGH (ref 0.50–1.10)
Potassium: 3.3 mEq/L — ABNORMAL LOW (ref 3.5–5.1)
Sodium: 142 mEq/L (ref 135–145)

## 2012-06-20 LAB — CBC
MCH: 30.6 pg (ref 26.0–34.0)
Platelets: 109 10*3/uL — ABNORMAL LOW (ref 150–400)
RBC: 3.63 MIL/uL — ABNORMAL LOW (ref 3.87–5.11)
WBC: 14.1 10*3/uL — ABNORMAL HIGH (ref 4.0–10.5)

## 2012-06-20 LAB — HEPATIC FUNCTION PANEL
ALT: 774 U/L — ABNORMAL HIGH (ref 0–35)
Alkaline Phosphatase: 70 U/L (ref 39–117)
Bilirubin, Direct: 0.5 mg/dL — ABNORMAL HIGH (ref 0.0–0.3)
Indirect Bilirubin: 1.3 mg/dL — ABNORMAL HIGH (ref 0.3–0.9)
Total Bilirubin: 1.8 mg/dL — ABNORMAL HIGH (ref 0.3–1.2)
Total Protein: 5.9 g/dL — ABNORMAL LOW (ref 6.0–8.3)

## 2012-06-20 LAB — MAGNESIUM: Magnesium: 1.7 mg/dL (ref 1.5–2.5)

## 2012-06-20 LAB — CARBOXYHEMOGLOBIN
Carboxyhemoglobin: 0.7 % (ref 0.5–1.5)
Methemoglobin: 1 % (ref 0.0–1.5)

## 2012-06-20 MED ORDER — SPIRONOLACTONE 12.5 MG HALF TABLET
12.5000 mg | ORAL_TABLET | Freq: Every day | ORAL | Status: DC
  Administered 2012-06-20: 12.5 mg via ORAL
  Filled 2012-06-20 (×2): qty 1

## 2012-06-20 MED ORDER — SODIUM CHLORIDE 0.9 % IJ SOLN: 3.0000 mL | Freq: Two times a day (BID) | INTRAMUSCULAR | Status: DC

## 2012-06-20 MED ORDER — POTASSIUM CHLORIDE CRYS ER 20 MEQ PO TBCR
40.0000 meq | EXTENDED_RELEASE_TABLET | Freq: Once | ORAL | Status: AC
  Administered 2012-06-20: 40 meq via ORAL
  Filled 2012-06-20: qty 2

## 2012-06-20 MED ORDER — SODIUM CHLORIDE 0.9 % IV SOLN: 250.0000 mL | INTRAVENOUS | Status: DC | PRN

## 2012-06-20 MED ORDER — POTASSIUM CHLORIDE CRYS ER 20 MEQ PO TBCR
40.0000 meq | EXTENDED_RELEASE_TABLET | Freq: Every day | ORAL | Status: DC
  Administered 2012-06-20 – 2012-06-23 (×4): 40 meq via ORAL
  Filled 2012-06-20 (×5): qty 2

## 2012-06-20 MED ORDER — MAGNESIUM SULFATE 40 MG/ML IJ SOLN
2.0000 g | Freq: Once | INTRAMUSCULAR | Status: AC
  Administered 2012-06-20: 2 g via INTRAVENOUS
  Filled 2012-06-20: qty 50

## 2012-06-20 MED ORDER — SODIUM CHLORIDE 0.9 % IJ SOLN: 3.0000 mL | INTRAMUSCULAR | Status: DC | PRN

## 2012-06-20 NOTE — Progress Notes (Signed)
Alene Mires PA stated to leave central line in place until after heart catheterization on 06/21/12

## 2012-06-20 NOTE — Progress Notes (Signed)
26 beats run of VTach, pt asymptomatic and asleep. On call MD (Dr. Ronney Asters) notified, am labs drawn early. Abnormal labs called to MD, K replaced as ordered. Will monitor closely.

## 2012-06-20 NOTE — Progress Notes (Addendum)
Advanced Heart Failure Rounding Note  Asked to evaluated for heart failure management and consideration of CRT upgrade.   Subjective:    67 yo female with history of systolic heart failure in the setting of NICM, EF 35-40% previously now 15%, paroxysmal SVT and VT s/p Biotronik ICD followed by Dr. Ladona Ridgel.     Admitted for WCT likely due to SVT placed on amiodarone (stopped in ED) s/p elective cardioversion c/b by hypotension and flash pulmonary edema requiring IV lasix.  Now on dobutamine gtt.   Diuresed net -4 L.  Weight down 7 pounds.  Co-ox 87%.  CVP 10-11.  Run of SVT at 2a this morning.    K 3.3 given extra 40 mEq this am and Mg 1.7  Objective:   Weight Range:  Vital Signs:   Temp:  [98 F (36.7 C)-100.8 F (38.2 C)] 98.9 F (37.2 C) (02/18 0400) Pulse Rate:  [83-109] 92 (02/18 0600) Resp:  [16-36] 19 (02/18 0600) BP: (82-131)/(37-94) 118/63 mmHg (02/18 0600) SpO2:  [93 %-100 %] 100 % (02/18 0600) Weight:  [182 lb 8.7 oz (82.8 kg)] 182 lb 8.7 oz (82.8 kg) (02/18 0500) Last BM Date: 06/17/12  Weight change: Filed Weights   06/18/12 1300 06/19/12 0500 06/20/12 0500  Weight: 185 lb 3 oz (84 kg) 189 lb 2.5 oz (85.8 kg) 182 lb 8.7 oz (82.8 kg)    Intake/Output:   Intake/Output Summary (Last 24 hours) at 06/20/12 0657 Last data filed at 06/20/12 0600  Gross per 24 hour  Intake 1063.5 ml  Output   5025 ml  Net -3961.5 ml     Physical Exam: General:  Well appearing. No resp difficulty HEENT: normal Neck: supple. JVP 10-11. Carotids 2+ bilat; no bruits. No lymphadenopathy or thryomegaly appreciated. Cor: PMI nondisplaced. Regular rate & rhythm. 2/6 TR. Lungs: clear Abdomen: soft, nontender, nondistended. No hepatosplenomegaly. No bruits or masses. Good bowel sounds. Extremities: no cyanosis, clubbing, rash, 1+edema Neuro: alert & orientedx3, cranial nerves grossly intact. moves all 4 extremities w/o difficulty. Affect pleasant  Telemetry: SR with WCT    Labs: Basic Metabolic Panel:  Recent Labs Lab 06/18/12 0942 06/18/12 1340 06/19/12 0335 06/20/12 0330  NA 143  --  141  141 142  K 5.0  --  4.2  4.1 3.3*  CL 107  --  110  108 105  CO2 17*  --  21  19 27   GLUCOSE 195*  --  116*  113* 110*  BUN 27*  --  29*  29* 24*  CREATININE 2.80* 2.72* 2.49*  2.48* 1.59*  CALCIUM 9.9  --  8.4  8.6 8.6  MG 2.4 1.8 1.9 1.7  PHOS  --   --  4.0  --     Liver Function Tests:  Recent Labs Lab 06/18/12 0942 06/19/12 0335 06/20/12 0330  AST 147* 838* 425*  ALT 146* 948* 774*  ALKPHOS 76 70 70  BILITOT 1.7* 0.9 1.8*  PROT 6.6 5.6* 5.9*  ALBUMIN 3.7 3.4* 3.2*   No results found for this basename: LIPASE, AMYLASE,  in the last 168 hours No results found for this basename: AMMONIA,  in the last 168 hours  CBC:  Recent Labs Lab 06/18/12 0942 06/18/12 1340 06/19/12 0335 06/20/12 0330  WBC 11.1* 14.1* 19.3* 14.1*  NEUTROABS 8.8*  --   --   --   HGB 13.1 12.8 12.3 11.1*  HCT 40.2 37.8 37.1 32.9*  MCV 92.8 93.1 91.6 90.6  PLT 151 124* 136* 109*  Cardiac Enzymes:  Recent Labs Lab 06/18/12 1323 06/19/12 0025  TROPONINI 9.87* >20.00*    BNP: BNP (last 3 results)  Recent Labs  06/19/12 0800  PROBNP 16784.0*      Imaging: Dg Chest Port 1 View  06/19/2012  *RADIOLOGY REPORT*  Clinical Data: Pulmonary edema, shortness of breath.  PORTABLE CHEST - 1 VIEW  Comparison: Earlier film of the same day  Findings: Right IJ central line and left subclavian AICD are stable in position.  External pacing lead remains in place.  Stable moderate cardiomegaly.  Low volumes with mild interstitial edema and central pulmonary vascular congestion, increased since the prior study.  There may be small pleural effusions.  IMPRESSION: 1. Cardiomegaly with increase in mild interstitial edema and vascular congestion. 2. Support hardware stable in position.   Original Report Authenticated By: D. Andria Rhein, MD    Dg Chest Port 1  View  06/19/2012  *RADIOLOGY REPORT*  Clinical Data: Respiratory failure  PORTABLE CHEST - 1 VIEW  Comparison: 06/18/2012  Findings: Stable positioning of jugular central line.  Stable cardiomegaly.  Lungs show slight decrease in volumes with scattered atelectasis present bilaterally.  No overt pulmonary edema or visible pleural fluid.  IMPRESSION: Decrease in lung volumes with scattered bilateral atelectasis.   Original Report Authenticated By: Irish Lack, M.D.    Dg Chest Portable 1 View  06/18/2012  *RADIOLOGY REPORT*  Clinical Data: Central line.  PORTABLE CHEST - 1 VIEW  Comparison: 06/18/2012  Findings: Left AICD remains in place, unchanged.  Right central line in place.  The tip is near the cavoatrial junction.  No pneumothorax.  Cardiomegaly with vascular congestion.  Low lung volumes.  Left lower lobe atelectasis or infiltrate is unchanged.  IMPRESSION: Right central line tip near the cavoatrial junction without pneumothorax.  Otherwise no real change.   Original Report Authenticated By: Charlett Nose, M.D.    Dg Chest Port 1 View  06/18/2012  *RADIOLOGY REPORT*  Clinical Data: Shortness of breath  PORTABLE CHEST - 1 VIEW  Comparison: 02/25/2009  Findings: Cardiomegaly and left AICD noted. Mild pulmonary vascular congestion is present. This is a low-volume film. There is no evidence of focal airspace disease, pulmonary edema, suspicious pulmonary nodule/mass, pleural effusion, or pneumothorax. No acute bony abnormalities are identified.  IMPRESSION: Cardiomegaly with mild pulmonary vascular congestion.   Original Report Authenticated By: Harmon Pier, M.D.       Medications:     Scheduled Medications: . aspirin EC  81 mg Oral Daily  . furosemide  40 mg Intravenous BID  . insulin aspart  0-15 Units Subcutaneous TID WC  . lidocaine   Urethral Once  . sodium chloride  500 mL Intravenous Once     Infusions: . sodium chloride 30 mL/hr at 06/18/12 1900  . DOBUTamine 5 mcg/kg/min  (06/20/12 0454)  . heparin 850 Units/hr (06/19/12 1535)  . norepinephrine (LEVOPHED) Adult infusion Stopped (06/19/12 1642)     PRN Medications:  acetaminophen, nitroGLYCERIN, ondansetron (ZOFRAN) IV   Assessment:   1. Cardiogenic shock  2. Acute on chronic systolic HF, EF 15%  (?ICM with troponin 20) 3. NSTEMI 4. Acute on chronic renal failure, improving 5. Hypokalemia  6. Hyperbilirubinemia  7. Hypomagnesemia  8. WCT/SVT   Plan/Discussion:    Volume status improving with IV lasix and dobutamine with co-ox 87%.  With CVP 10-11 will continue dobutamine and IV lasix today.  Replete K and magnesium.  Reassess volume status in the morning.  With troponin bump to 20  and Cr improving will plan for R and L heart catheterization tomorrow.    Will discuss CRT further with Dr. Graciela Husbands as it appears the patient continues to flip axis and unsure if CRT would benefit patient.     Length of Stay: 2  Robbi Garter, [facility] 06/20/2012, 6:57 AM  Patient seen with PA, agree with the above note.   1. Acute on chronic systolic CHF: EF down 15% with cardiogenic shock at admission and pulmonary edema.  She has responded well to dobutamine and IV lasix with good diuresis.  Co-ox > 80% this am.  Renal function improving.  - Given good co-ox and brief recurrent SVT, decrease dobutamine to 2.5 this morning.  - CVP still high, continue current IV Lasix (responded well).  - add spironolactone 12.5 mg daily.  - Need to rule out ischemic cause for fall in EF/CHF exacerbation.  TnI was > 20.  Event would have happened on 2/16.  No chest pain.  Creatinine trending down now.  Plan for [facility] tomorrow (would give one more day for renal fxn to improve as stable and no chest pain).   - Patient has intermittent [name] (alternates with incomplete RBBB).  If this becomes persistent, would consider CRT upgrade.  2. CAD: ? Ischemic cause for presentation.  EF down to 15% with rWMAs and TnI > 20.  Continue ASA and heparin  gtt. Creatinine trending down, plan for [facility] tomorrow if renal function continues to improve.  No statin for now with elevated transaminases.  3. SVT: One brief episode last night.  Had device reprogrammed for more effective ATP.  When stable, would favor ablation.  4. Renal: AKI in setting of cardiogenic shock.  Resolving now with better hemodynamics.  As above, will allow one more day to improve before cath.  5. Elevated transaminases: Likely shock liver in setting of hypotension.  Need to follow.   [name] 06/20/2012 8:16 AM

## 2012-06-20 NOTE — Progress Notes (Signed)
ANTICOAGULATION CONSULT NOTE - Follow Up Consult  Pharmacy Consult for heparin Indication: chest pain/ACS  No Known Allergies  Patient Measurements: Height: 5\' 3"  (160 cm) Weight: 182 lb 8.7 oz (82.8 kg) IBW/kg (Calculated) : 52.4 Heparin Dosing Weight: 70.7kg  Vital Signs: Temp: 98.4 F (36.9 C) (02/18 0730) Temp src: Oral (02/18 0730) BP: 115/58 mmHg (02/18 0700) Pulse Rate: 90 (02/18 0700)  Labs:  Recent Labs  06/18/12 1323 06/18/12 1340 06/19/12 0025 06/19/12 0335 06/19/12 1520 06/19/12 2230 06/20/12 0330  HGB  --  12.8  --  12.3  --   --  11.1*  HCT  --  37.8  --  37.1  --   --  32.9*  PLT  --  124*  --  136*  --   --  109*  APTT  --   --   --   --  30  --   --   LABPROT  --   --   --   --  15.7*  --   --   INR  --   --   --   --  1.28  --   --   HEPARINUNFRC  --   --   --   --   --  0.44 0.40  CREATININE  --  2.72*  --  2.49*  2.48*  --   --  1.59*  TROPONINI 9.87*  --  >20.00*  --   --   --   --     Estimated Creatinine Clearance: 36.5 ml/min (by C-G formula based on Cr of 1.59).   Assessment: 89 YOF admitted with wide-complex tachycardia who had her AICD reprogrammed, but troponin bumped to >20. Now on heparin for ACS and anticipating cath tomorrow as renal function is now improving. Heparin level this morning 0.4. H/H has decreased slightly as have platelets. Suspect a dilutional component, but will continue to watch. No bleeding noted.  Goal of Therapy:  Heparin level 0.3-0.7 units/ml Monitor platelets by anticoagulation protocol: Yes   Plan:  1. Continue heparin drip at 850 units/hr 2. Daily CBC and heparin level while on therapy  Tacori Kvamme D. Jereline Ticer, PharmD Clinical Pharmacist Pager: [phone] 06/20/2012 8:18 AM

## 2012-06-21 DIAGNOSIS — I214 Non-ST elevation (NSTEMI) myocardial infarction: Secondary | ICD-10-CM

## 2012-06-21 LAB — COMPREHENSIVE METABOLIC PANEL
BUN: 20 mg/dL (ref 6–23)
CO2: 27 mEq/L (ref 19–32)
Calcium: 9 mg/dL (ref 8.4–10.5)
Creatinine, Ser: 1.13 mg/dL — ABNORMAL HIGH (ref 0.50–1.10)
GFR calc Af Amer: 58 mL/min — ABNORMAL LOW (ref >90)
GFR calc non Af Amer: 50 mL/min — ABNORMAL LOW (ref >90)
Glucose, Bld: 100 mg/dL — ABNORMAL HIGH (ref 70–99)

## 2012-06-21 LAB — CARBOXYHEMOGLOBIN
Methemoglobin: 1.1 % (ref 0.0–1.5)
Total hemoglobin: 11.4 g/dL — ABNORMAL LOW (ref 12.0–16.0)

## 2012-06-21 LAB — CBC
MCH: 30.6 pg (ref 26.0–34.0)
MCHC: 33.5 g/dL (ref 30.0–36.0)
Platelets: 113 10*3/uL — ABNORMAL LOW (ref 150–400)
RBC: 3.63 MIL/uL — ABNORMAL LOW (ref 3.87–5.11)
RDW: 14 % (ref 11.5–15.5)

## 2012-06-21 LAB — BASIC METABOLIC PANEL
BUN: 20 mg/dL (ref 6–23)
GFR calc Af Amer: 54 mL/min — ABNORMAL LOW (ref >90)
GFR calc non Af Amer: 46 mL/min — ABNORMAL LOW (ref >90)
Potassium: 4.1 mEq/L (ref 3.5–5.1)
Sodium: 138 mEq/L (ref 135–145)

## 2012-06-21 MED ORDER — SODIUM CHLORIDE 0.9 % IJ SOLN: 3.0000 mL | INTRAMUSCULAR | Status: DC | PRN

## 2012-06-21 MED ORDER — SODIUM CHLORIDE 0.9 % IJ SOLN
3.0000 mL | Freq: Two times a day (BID) | INTRAMUSCULAR | Status: DC
  Administered 2012-06-21 – 2012-06-23 (×4): 3 mL via INTRAVENOUS

## 2012-06-21 MED ORDER — LISINOPRIL 2.5 MG PO TABS
2.5000 mg | ORAL_TABLET | Freq: Two times a day (BID) | ORAL | Status: DC
  Administered 2012-06-21 – 2012-06-22 (×4): 2.5 mg via ORAL
  Filled 2012-06-21 (×6): qty 1

## 2012-06-21 MED ORDER — POTASSIUM CHLORIDE 20 MEQ PO PACK: 40.0000 meq | PACK | Freq: Once | ORAL | Status: DC

## 2012-06-21 MED ORDER — HEPARIN (PORCINE) IN NACL 100-0.45 UNIT/ML-% IJ SOLN
1050.0000 [IU]/h | INTRAMUSCULAR | Status: DC
  Administered 2012-06-21: 1050 [IU]/h via INTRAVENOUS
  Filled 2012-06-21 (×3): qty 250

## 2012-06-21 MED ORDER — SODIUM CHLORIDE 0.9 % IV SOLN: 250.0000 mL | INTRAVENOUS | Status: DC | PRN

## 2012-06-21 MED ORDER — ASPIRIN 81 MG PO CHEW
324.0000 mg | CHEWABLE_TABLET | ORAL | Status: AC
  Administered 2012-06-22: 324 mg via ORAL
  Filled 2012-06-21: qty 4

## 2012-06-21 MED ORDER — SPIRONOLACTONE 25 MG PO TABS
25.0000 mg | ORAL_TABLET | Freq: Every day | ORAL | Status: DC
  Administered 2012-06-21 – 2012-06-27 (×7): 25 mg via ORAL
  Filled 2012-06-21 (×7): qty 1

## 2012-06-21 NOTE — Progress Notes (Signed)
ANTICOAGULATION CONSULT NOTE - Follow Up Consult  Pharmacy Consult for heparin Indication: chest pain/ACS   Labs:  Recent Labs  06/18/12 1323 06/18/12 1340 06/19/12 0025 06/19/12 0335 06/19/12 1520 06/19/12 2230 06/20/12 0330 06/21/12 0405  HGB  --  12.8  --  12.3  --   --  11.1* 11.1*  HCT  --  37.8  --  37.1  --   --  32.9* 33.1*  PLT  --  124*  --  136*  --   --  109* 113*  APTT  --   --   --   --  30  --   --   --   LABPROT  --   --   --   --  15.7*  --   --   --   INR  --   --   --   --  1.28  --   --   --   HEPARINUNFRC  --   --   --   --   --  0.44 0.40 0.29*  CREATININE  --  2.72*  --  2.49*  2.48*  --   --  1.59*  --   TROPONINI 9.87*  --  >20.00*  --   --   --   --   --      Assessment: 67yo female now slightly subtherapeutic on heparin after two levels at goal.  Goal of Therapy:  Heparin level 0.3-0.7 units/ml   Plan:  Will increase heparin gtt slightly to 900 units/hr and check level in 6-8hr.  Colleen Can PharmD BCPS 06/21/2012,4:54 AM

## 2012-06-21 NOTE — Progress Notes (Signed)
Transferred from 2901 by bed, stable.

## 2012-06-21 NOTE — Progress Notes (Signed)
Patient ID: Kaitlin [name], female   DOB: Feb 06, 1946, 67 y.o.   MRN: [id number] Advanced Heart Failure Rounding Note   Subjective:    67 yo female with history of systolic heart failure in the setting of NICM, EF 35-40% previously now 15%, paroxysmal SVT and VT s/p Biotronik ICD followed by Dr. Ladona Ridgel.     Admitted for WCT likely due to SVT placed on amiodarone (stopped in ED) s/p elective cardioversion c/b by hypotension and flash pulmonary edema requiring IV lasix.  Now on dobutamine gtt.   Diuresed net -2163 mL.  Weight down 2 pounds.  Co-ox 73%.  CVP 10-11.  No further SVT  Feels well, no chest pain or dyspnea.   Objective:   Weight Range:  Vital Signs:   Temp:  [97.8 F (36.6 C)-99.1 F (37.3 C)] 99.1 F (37.3 C) (02/19 0500) Pulse Rate:  [73-100] 76 (02/19 0600) Resp:  [14-26] 15 (02/19 0600) BP: (92-138)/(46-90) 123/90 mmHg (02/19 0600) SpO2:  [93 %-100 %] 98 % (02/19 0600) Weight:  [180 lb 8.9 oz (81.9 kg)] 180 lb 8.9 oz (81.9 kg) (02/19 0500) Last BM Date: 06/17/12  Weight change: Filed Weights   06/19/12 0500 06/20/12 0500 06/21/12 0500  Weight: 189 lb 2.5 oz (85.8 kg) 182 lb 8.7 oz (82.8 kg) 180 lb 8.9 oz (81.9 kg)    Intake/Output:   Intake/Output Summary (Last 24 hours) at 06/21/12 0711 Last data filed at 06/21/12 0600  Gross per 24 hour  Intake 1236.6 ml  Output   3400 ml  Net -2163.4 ml     Physical Exam: General:  Well appearing. No resp difficulty HEENT: normal Neck: supple. JVP 10-11. Carotids 2+ bilat; no bruits. No lymphadenopathy or thryomegaly appreciated. Cor: PMI nondisplaced. Regular rate & rhythm. 2/6 TR. Lungs: clear Abdomen: soft, nontender, nondistended. No hepatosplenomegaly. No bruits or masses. Good bowel sounds. Extremities: no cyanosis, clubbing, rash, 1+edema Neuro: alert & orientedx3, cranial nerves grossly intact. moves all 4 extremities w/o difficulty. Affect pleasant  Telemetry: SR with WCT   Labs: Basic Metabolic  Panel:  Recent Labs Lab 06/18/12 0942 06/18/12 1340 06/19/12 0335 06/20/12 0330 06/21/12 0405  NA 143  --  141  141 142 139  K 5.0  --  4.2  4.1 3.3* 3.8  CL 107  --  110  108 105 103  CO2 17*  --  21  19 27 27   GLUCOSE 195*  --  116*  113* 110* 100*  BUN 27*  --  29*  29* 24* 20  CREATININE 2.80* 2.72* 2.49*  2.48* 1.59* 1.13*  CALCIUM 9.9  --  8.4  8.6 8.6 9.0  MG 2.4 1.8 1.9 1.7  --   PHOS  --   --  4.0  --   --     Liver Function Tests:  Recent Labs Lab 06/18/12 0942 06/19/12 0335 06/20/12 0330 06/21/12 0405  AST 147* 838* 425* 211*  ALT 146* 948* 774* 624*  ALKPHOS 76 70 70 75  BILITOT 1.7* 0.9 1.8* 1.6*  PROT 6.6 5.6* 5.9* 6.1  ALBUMIN 3.7 3.4* 3.2* 3.1*   No results found for this basename: LIPASE, AMYLASE,  in the last 168 hours No results found for this basename: AMMONIA,  in the last 168 hours  CBC:  Recent Labs Lab 06/18/12 0942 06/18/12 1340 06/19/12 0335 06/20/12 0330 06/21/12 0405  WBC 11.1* 14.1* 19.3* 14.1* 10.0  NEUTROABS 8.8*  --   --   --   --  HGB 13.1 12.8 12.3 11.1* 11.1*  HCT 40.2 37.8 37.1 32.9* 33.1*  MCV 92.8 93.1 91.6 90.6 91.2  PLT 151 124* 136* 109* 113*    Cardiac Enzymes:  Recent Labs Lab 06/18/12 1323 06/19/12 0025  TROPONINI 9.87* >20.00*    BNP: BNP (last 3 results)  Recent Labs  06/19/12 0800  PROBNP 16784.0*      Imaging: Dg Chest Port 1 View  06/19/2012  *RADIOLOGY REPORT*  Clinical Data: Pulmonary edema, shortness of breath.  PORTABLE CHEST - 1 VIEW  Comparison: Earlier film of the same day  Findings: Right IJ central line and left subclavian AICD are stable in position.  External pacing lead remains in place.  Stable moderate cardiomegaly.  Low volumes with mild interstitial edema and central pulmonary vascular congestion, increased since the prior study.  There may be small pleural effusions.  IMPRESSION: 1. Cardiomegaly with increase in mild interstitial edema and vascular congestion. 2.  Support hardware stable in position.   Original Report Authenticated By: D. Andria Rhein, MD      Medications:     Scheduled Medications: . aspirin EC  81 mg Oral Daily  . furosemide  40 mg Intravenous BID  . lidocaine   Urethral Once  . lisinopril  2.5 mg Oral BID  . potassium chloride  40 mEq Oral Daily  . sodium chloride  500 mL Intravenous Once  . sodium chloride  3 mL Intravenous Q12H  . spironolactone  25 mg Oral Daily    Infusions: . sodium chloride 25 mL/hr at 06/20/12 2000  . DOBUTamine 2.5 mcg/kg/min (06/20/12 1010)  . heparin 900 Units/hr (06/21/12 0500)    PRN Medications: sodium chloride, acetaminophen, nitroGLYCERIN, ondansetron (ZOFRAN) IV, sodium chloride   Assessment:   1. Cardiogenic shock  2. Acute on chronic systolic HF, EF 15%  (?ICM with troponin 20) 3. NSTEMI 4. Acute on chronic renal failure, improving 5. Hypokalemia  6. Hyperbilirubinemia  7. Hypomagnesemia  8. WCT/SVT   Plan/Discussion:     1. Acute on chronic systolic CHF: EF down 15% with cardiogenic shock at admission and pulmonary edema.  She has responded well to dobutamine and IV lasix with good diuresis.  Co-ox 73% this am.  Renal function improving.  CVP still elevated. - Continue dobutamine at 2.5 and IV Lasix today.  - Increase spironolactone to 25 mg daily.  - Add lisinopril 2.5 mg bid.   - Need to rule out ischemic cause for fall in EF/CHF exacerbation.  TnI was > 20.  Event would have happened on 2/16.  No chest pain.  Creatinine trending down now.  Plan for [facility] tomorrow (would give one more day to optimize filling pressures and hemodynamics for RHC - if diureses well, will stop dobutamine tomorrow morning prior to cath).   - Patient has intermittent [name] (alternates with incomplete RBBB).  If this becomes persistent, would consider CRT upgrade.  2. CAD: ? Ischemic cause for presentation.  EF down to 15% with rWMAs and TnI > 20.  Continue ASA and heparin gtt. Creatinine trending  down, plan for [facility] tomorrow.  No statin for now with elevated transaminases.  3. SVT: No further SVT.  Had device reprogrammed for more effective ATP.  When stable, would favor ablation.  4. Renal: AKI in setting of cardiogenic shock.  Resolving now with better hemodynamics.  As above, will allow one more day to improve before cath.  5. Elevated transaminases: Likely shock liver in setting of hypotension.  Trending down.  [name] 06/21/2012 7:11 AM

## 2012-06-21 NOTE — Progress Notes (Signed)
Had short runs of v-tach asymptomatic.

## 2012-06-21 NOTE — Progress Notes (Signed)
Order for [employer] removal. Spoke with cardiology PA [place] and she states that she spoke with Dr Shirlee Latch and He would like for the central line to remain in until after the cardiac cath.

## 2012-06-21 NOTE — Progress Notes (Signed)
ANTICOAGULATION CONSULT NOTE - Follow Up Consult  Pharmacy Consult for heparin Indication: chest pain/ACS   Labs:  Recent Labs  06/18/12 1323  06/19/12 0025 06/19/12 0335 06/19/12 1520  06/20/12 0330 06/21/12 0405 06/21/12 1156  HGB  --   < >  --  12.3  --   --  11.1* 11.1*  --   HCT  --   < >  --  37.1  --   --  32.9* 33.1*  --   PLT  --   < >  --  136*  --   --  109* 113*  --   APTT  --   --   --   --  30  --   --   --   --   LABPROT  --   --   --   --  15.7*  --   --   --   --   INR  --   --   --   --  1.28  --   --   --   --   HEPARINUNFRC  --   --   --   --   --   < > 0.40 0.29* 0.27*  CREATININE  --   < >  --  2.49*  2.48*  --   --  1.59* 1.13*  --   TROPONINI 9.87*  --  >20.00*  --   --   --   --   --   --   < > = values in this interval not displayed.   Assessment: 67yo female for r/o ACS on heparin. Heparin level is below goal at 0.27 after increase to 900 units/hr. Patient noted for cath 04/21/13.  Goal of Therapy:  Heparin level 0.3-0.7 units/ml   Plan:   -Increase heparin to 1050 units/hr -Heparin level in am  Benny Lennert PharmD BCPS 06/21/2012,12:49 PM

## 2012-06-22 ENCOUNTER — Encounter (HOSPITAL_COMMUNITY)
Admission: EM | Disposition: A | Discharge status: Home / Self Care | Payer: Self-pay | Source: Home / Self Care | Attending: Cardiology

## 2012-06-22 DIAGNOSIS — I509 Heart failure, unspecified: Secondary | ICD-10-CM

## 2012-06-22 DIAGNOSIS — I5023 Acute on chronic systolic (congestive) heart failure: Secondary | ICD-10-CM

## 2012-06-22 HISTORY — PX: LEFT AND RIGHT HEART CATHETERIZATION WITH CORONARY ANGIOGRAM: SHX5449

## 2012-06-22 LAB — POCT I-STAT 3, ART BLOOD GAS (G3+)
Acid-Base Excess: 1 mmol/L (ref 0.0–2.0)
Bicarbonate: 26.2 mEq/L — ABNORMAL HIGH (ref 20.0–24.0)
O2 Saturation: 98 %
TCO2: 27 mmol/L (ref 0–100)
pCO2 arterial: 40.5 mmHg (ref 35.0–45.0)
pH, Arterial: 7.418 (ref 7.350–7.450)
pO2, Arterial: 112 mmHg — ABNORMAL HIGH (ref 80.0–100.0)

## 2012-06-22 LAB — CBC
HCT: 36 % (ref 36.0–46.0)
MCH: 30.4 pg (ref 26.0–34.0)
MCHC: 34.7 g/dL (ref 30.0–36.0)
Platelets: 152 10*3/uL (ref 150–400)
Platelets: 166 10*3/uL (ref 150–400)
RBC: 3.98 MIL/uL (ref 3.87–5.11)
RDW: 13.9 % (ref 11.5–15.5)
WBC: 10.3 10*3/uL (ref 4.0–10.5)
WBC: 8.8 10*3/uL (ref 4.0–10.5)

## 2012-06-22 LAB — POCT I-STAT 3, VENOUS BLOOD GAS (G3P V)
Acid-base deficit: 7 mmol/L — ABNORMAL HIGH (ref 0.0–2.0)
Bicarbonate: 19.3 mEq/L — ABNORMAL LOW (ref 20.0–24.0)
pH, Ven: 7.279 (ref 7.250–7.300)
pO2, Ven: 34 mmHg (ref 30.0–45.0)

## 2012-06-22 LAB — COMPREHENSIVE METABOLIC PANEL
AST: 108 U/L — ABNORMAL HIGH (ref 0–37)
Albumin: 3.3 g/dL — ABNORMAL LOW (ref 3.5–5.2)
BUN: 19 mg/dL (ref 6–23)
Calcium: 9.8 mg/dL (ref 8.4–10.5)
Creatinine, Ser: 1.23 mg/dL — ABNORMAL HIGH (ref 0.50–1.10)
Total Bilirubin: 1.2 mg/dL (ref 0.3–1.2)

## 2012-06-22 LAB — CARBOXYHEMOGLOBIN
Carboxyhemoglobin: 0.8 % (ref 0.5–1.5)
Methemoglobin: 1 % (ref 0.0–1.5)
O2 Saturation: 62.4 %
Total hemoglobin: 12.4 g/dL (ref 12.0–16.0)

## 2012-06-22 LAB — CREATININE, SERUM
Creatinine, Ser: 1.15 mg/dL — ABNORMAL HIGH (ref 0.50–1.10)
GFR calc Af Amer: 57 mL/min — ABNORMAL LOW (ref >90)
GFR calc non Af Amer: 49 mL/min — ABNORMAL LOW (ref >90)

## 2012-06-22 SURGERY — LEFT AND RIGHT HEART CATHETERIZATION WITH CORONARY ANGIOGRAM
Anesthesia: LOCAL

## 2012-06-22 SURGERY — LEFT AND RIGHT HEART CATHETERIZATION WITH CORONARY ANGIOGRAM
Anesthesia: Moderate Sedation

## 2012-06-22 MED ORDER — HEPARIN SODIUM (PORCINE) 5000 UNIT/ML IJ SOLN
5000.0000 [IU] | Freq: Three times a day (TID) | INTRAMUSCULAR | Status: DC
  Administered 2012-06-22 – 2012-06-27 (×16): 5000 [IU] via SUBCUTANEOUS
  Filled 2012-06-22 (×18): qty 1

## 2012-06-22 MED ORDER — FUROSEMIDE 40 MG PO TABS
40.0000 mg | ORAL_TABLET | Freq: Every day | ORAL | Status: DC
  Administered 2012-06-22 – 2012-06-23 (×2): 40 mg via ORAL
  Filled 2012-06-22 (×3): qty 1

## 2012-06-22 MED ORDER — LIDOCAINE HCL (PF) 1 % IJ SOLN
INTRAMUSCULAR | Status: AC
  Filled 2012-06-22: qty 30

## 2012-06-22 MED ORDER — SODIUM CHLORIDE 0.9 % IJ SOLN: 3.0000 mL | INTRAMUSCULAR | Status: DC | PRN

## 2012-06-22 MED ORDER — DIGOXIN 125 MCG PO TABS
0.1250 mg | ORAL_TABLET | Freq: Every day | ORAL | Status: DC
  Administered 2012-06-22 – 2012-06-27 (×6): 0.125 mg via ORAL
  Filled 2012-06-22 (×6): qty 1

## 2012-06-22 MED ORDER — FENTANYL CITRATE 0.05 MG/ML IJ SOLN
INTRAMUSCULAR | Status: AC
  Filled 2012-06-22: qty 2

## 2012-06-22 MED ORDER — ONDANSETRON HCL 4 MG/2ML IJ SOLN: 4.0000 mg | Freq: Four times a day (QID) | INTRAMUSCULAR | Status: DC | PRN

## 2012-06-22 MED ORDER — HEPARIN (PORCINE) IN NACL 2-0.9 UNIT/ML-% IJ SOLN
INTRAMUSCULAR | Status: AC
  Filled 2012-06-22: qty 500

## 2012-06-22 MED ORDER — MIDAZOLAM HCL 2 MG/2ML IJ SOLN
INTRAMUSCULAR | Status: AC
  Filled 2012-06-22: qty 2

## 2012-06-22 MED ORDER — ACETAMINOPHEN 325 MG PO TABS
650.0000 mg | ORAL_TABLET | ORAL | Status: DC | PRN
  Administered 2012-06-23: 650 mg via ORAL
  Filled 2012-06-22: qty 2

## 2012-06-22 MED ORDER — SODIUM CHLORIDE 0.9 % IJ SOLN
3.0000 mL | Freq: Two times a day (BID) | INTRAMUSCULAR | Status: DC
  Administered 2012-06-23 – 2012-06-26 (×7): 3 mL via INTRAVENOUS

## 2012-06-22 MED ORDER — SODIUM CHLORIDE 0.9 % IV SOLN: 250.0000 mL | INTRAVENOUS | Status: DC | PRN

## 2012-06-22 NOTE — Progress Notes (Signed)
Patient ID: Kaitlin [name], female   DOB: 27-Mar-1946, 66 y.o.   MRN: [id number] Advanced Heart Failure Rounding Note   Subjective:    67 yo female with history of systolic heart failure in the setting of NICM, EF 35-40% previously now 15%, paroxysmal SVT and VT s/p Biotronik ICD followed by Dr. Ladona Ridgel.     Admitted for WCT likely due to SVT placed on amiodarone (stopped in ED) s/p elective cardioversion c/b by hypotension and flash pulmonary edema requiring IV lasix.  Now on dobutamine gtt.   Diuresed net -1339 mL.  Co-ox 62%.  CVP 6 this am.  Frequent PVCs.  Feels well, no chest pain or dyspnea.   Objective:   Weight Range:  Vital Signs:   Temp:  [97.7 F (36.5 C)-99.3 F (37.4 C)] 98.6 F (37 C) (02/20 0400) Pulse Rate:  [71-89] 71 (02/20 0600) Resp:  [13-26] 15 (02/20 0600) BP: (84-126)/(30-64) 103/55 mmHg (02/20 0600) SpO2:  [90 %-100 %] 100 % (02/20 0600) Weight:  [186 lb 14.4 oz (84.777 kg)-187 lb 1.6 oz (84.868 kg)] 186 lb 14.4 oz (84.777 kg) (02/20 0500) Last BM Date: 06/17/12  Weight change: Filed Weights   06/21/12 0500 06/21/12 2002 06/22/12 0500  Weight: 180 lb 8.9 oz (81.9 kg) 187 lb 1.6 oz (84.868 kg) 186 lb 14.4 oz (84.777 kg)    Intake/Output:   Intake/Output Summary (Last 24 hours) at 06/22/12 0720 Last data filed at 06/22/12 0600  Gross per 24 hour  Intake 1235.4 ml  Output   2575 ml  Net -1339.6 ml     Physical Exam: General:  Well appearing. No resp difficulty HEENT: normal Neck: supple. JVP 8. Carotids 2+ bilat; no bruits. No lymphadenopathy or thryomegaly appreciated. Cor: PMI nondisplaced. Regular rate & rhythm. 2/6 TR. Lungs: clear Abdomen: soft, nontender, nondistended. No hepatosplenomegaly. No bruits or masses. Good bowel sounds. Extremities: no cyanosis, clubbing, rash, 1+edema Neuro: alert & orientedx3, cranial nerves grossly intact. moves all 4 extremities w/o difficulty. Affect pleasant  Telemetry: SR with WCT   Labs: Basic  Metabolic Panel:  Recent Labs Lab 06/18/12 0942 06/18/12 1340 06/19/12 0335 06/20/12 0330 06/21/12 0405 06/21/12 1600 06/22/12 0410  NA 143  --  141  141 142 139 138 141  K 5.0  --  4.2  4.1 3.3* 3.8 4.1 3.8  CL 107  --  110  108 105 103 102 103  CO2 17*  --  21  19 27 27 29 29   GLUCOSE 195*  --  116*  113* 110* 100* 108* 91  BUN 27*  --  29*  29* 24* 20 20 19   CREATININE 2.80* 2.72* 2.49*  2.48* 1.59* 1.13* 1.21* 1.23*  CALCIUM 9.9  --  8.4  8.6 8.6 9.0 9.6 9.8  MG 2.4 1.8 1.9 1.7  --   --   --   PHOS  --   --  4.0  --   --   --   --     Liver Function Tests:  Recent Labs Lab 06/18/12 0942 06/19/12 0335 06/20/12 0330 06/21/12 0405 06/22/12 0410  AST 147* 838* 425* 211* 108*  ALT 146* 948* 774* 624* 487*  ALKPHOS 76 70 70 75 91  BILITOT 1.7* 0.9 1.8* 1.6* 1.2  PROT 6.6 5.6* 5.9* 6.1 6.4  ALBUMIN 3.7 3.4* 3.2* 3.1* 3.3*   No results found for this basename: LIPASE, AMYLASE,  in the last 168 hours No results found for this basename: AMMONIA,  in the  last 168 hours  CBC:  Recent Labs Lab 06/18/12 0942 06/18/12 1340 06/19/12 0335 06/20/12 0330 06/21/12 0405 06/22/12 0410  WBC 11.1* 14.1* 19.3* 14.1* 10.0 10.3  NEUTROABS 8.8*  --   --   --   --   --   HGB 13.1 12.8 12.3 11.1* 11.1* 12.1  HCT 40.2 37.8 37.1 32.9* 33.1* 36.1  MCV 92.8 93.1 91.6 90.6 91.2 90.7  PLT 151 124* 136* 109* 113* 152    Cardiac Enzymes:  Recent Labs Lab 06/18/12 1323 06/19/12 0025  TROPONINI 9.87* >20.00*    BNP: BNP (last 3 results)  Recent Labs  06/19/12 0800  PROBNP 16784.0*      Imaging: No results found.   Medications:     Scheduled Medications: . aspirin  324 mg Oral Pre-Cath  . aspirin EC  81 mg Oral Daily  . furosemide  40 mg Intravenous BID  . lidocaine   Urethral Once  . lisinopril  2.5 mg Oral BID  . potassium chloride  40 mEq Oral Daily  . sodium chloride  500 mL Intravenous Once  . sodium chloride  3 mL Intravenous Q12H  .  spironolactone  25 mg Oral Daily    Infusions: . sodium chloride 25 mL/hr at 06/20/12 2000  . DOBUTamine 2.5 mcg/kg/min (06/20/12 1010)  . heparin 1,050 Units/hr (06/21/12 1604)    PRN Medications: sodium chloride, acetaminophen, nitroGLYCERIN, ondansetron (ZOFRAN) IV, sodium chloride   Assessment:   1. Cardiogenic shock  2. Acute on chronic systolic HF, EF 15%  (?ICM with troponin 20) 3. NSTEMI 4. Acute on chronic renal failure, improving 5. Hypokalemia  6. Hyperbilirubinemia  7. Hypomagnesemia  8. WCT/SVT   Plan/Discussion:     1. Acute on chronic systolic CHF: EF down 15% with cardiogenic shock at admission and pulmonary edema.  She has responded well to dobutamine and IV lasix with good diuresis.  Renal function improved.  CVP down to 6 this morning. - Change to Lasix 40 mg po daily.   - I am going to take her off dobutamine this morning, will start digoxin.  - Continue current doses of spironolactone and lisinopril. - Need to rule out ischemic cause for fall in EF/CHF exacerbation.  TnI was > 20.  Event would have happened on 2/16.  No chest pain.  Creatinine better. Plan for [facility] today. - Patient has intermittent [name] (alternates with incomplete RBBB).  If this becomes persistent, would consider CRT upgrade.  2. CAD: ? Ischemic cause for presentation.  EF down to 15% with rWMAs and TnI > 20.  Continue ASA and heparin gtt. Renal fxn better, LHC/RHC today.  No statin for now with elevated transaminases.  3. SVT: No further SVT.  Had device reprogrammed for more effective ATP.  When stable, would consider ablation.  4. Renal: AKI in setting of cardiogenic shock.  Improved with better hemodynamics.   5. Elevated transaminases: Likely shock liver in setting of hypotension.  Trending down.    [name] 06/22/2012 7:20 AM

## 2012-06-22 NOTE — Progress Notes (Signed)
Back from the cath lab awake and alert. Instructions  And bedrest emphasized.

## 2012-06-22 NOTE — CV Procedure (Signed)
   Cardiac Catheterization Procedure Note  Name: Kaitlin [name] MRN: [id number] DOB: 1945/05/11  Procedure: Right Heart Cath, Left Heart Cath, Selective Coronary Angiography  Indication:    Procedural Details: The right groin was prepped, draped, and anesthetized with 1% lidocaine. Using the modified Seldinger technique a 5 French sheath was placed in the right femoral artery and a 7 French sheath was placed in the right femoral vein. A Swan-Ganz catheter was used for the right heart catheterization. Standard protocol was followed for recording of right heart pressures and sampling of oxygen saturations. Fick and thermodilution cardiac output was calculated. Standard Judkins catheters were used for selective coronary angiography. A Mynx device was used for groin closure.  There were no immediate procedural complications. The patient was transferred to the post catheterization recovery area for further monitoring.  Procedural Findings: Hemodynamics (mmHg) RA 10 RV 48/8 PA 42/16, mean 29 PCWP mean 14 LV 118/29 AO 122/82  Oxygen saturations: PA 57% AO 98%  Cardiac Output (Fick) 3.71  Cardiac Index (Fick) 2.0 PVR 4 WU  Cardiac Output (Thermo) 2.45 Cardiac Index (Fick) 1.3   Coronary angiography: Coronary dominance: right  Left mainstem: No angiographic CAD.  Left anterior descending (LAD): Luminal irregularities.  Left circumflex (LCx): No angiographic CAD.   Right coronary artery (RCA): No angiographic CAD.   Left ventriculography: Not done, recent echo and recent AKI so trying to limit contrast.   Final Conclusions:  No angiographic CAD, nonischemic cardiomyopathy.  PCWP not elevated and RA mildly elevated.  However, LVEDP was 29.  Also noted was CI 2.0 by Fick but 1.3 by thermodilution.  As she is clinically improved, I will keep her off IV inotropes for now and start digoxin. She is on po diuretics now which based on PCWP seems reasonable.   [name] 06/22/2012, 12:06 PM

## 2012-06-22 NOTE — Progress Notes (Signed)
To the cath lab by bed, stable. 

## 2012-06-22 NOTE — Progress Notes (Signed)
ANTICOAGULATION CONSULT NOTE - Follow Up Consult  Pharmacy Consult for heparin Indication: chest pain/ACS  No Known Allergies  Patient Measurements: Height: 5\' 3"  (160 cm) Weight: 186 lb 14.4 oz (84.777 kg) IBW/kg (Calculated) : 52.4 Heparin Dosing Weight: 70.7kg  Vital Signs: Temp: 98.7 F (37.1 C) (02/20 0715) Temp src: Oral (02/20 0715) BP: 121/77 mmHg (02/20 0900) Pulse Rate: 81 (02/20 0900)  Labs:  Recent Labs  06/19/12 1520  06/20/12 0330 06/21/12 0405 06/21/12 1156 06/21/12 1600 06/22/12 0410  HGB  --   < > 11.1* 11.1*  --   --  12.1  HCT  --   --  32.9* 33.1*  --   --  36.1  PLT  --   --  109* 113*  --   --  152  APTT 30  --   --   --   --   --   --   LABPROT 15.7*  --   --   --   --   --   --   INR 1.28  --   --   --   --   --   --   HEPARINUNFRC  --   < > 0.40 0.29* 0.27*  --  0.41  CREATININE  --   < > 1.59* 1.13*  --  1.21* 1.23*  < > = values in this interval not displayed.  Estimated Creatinine Clearance: 47.7 ml/min (by C-G formula based on Cr of 1.23).   Assessment: 67 YOF with wide-complex tachycardia who had her AICD reprogrammed, but then had significantly elevated troponins. Has been on heparin while awaiting cath when her kidney function improved. Heparin level this morning (0.41) was therapeutic after an increase in rate last evening. CBC has been stable and no bleeding noted. Patient is currently in the cath lab.  Goal of Therapy:  Heparin level 0.3-0.7 units/ml Monitor platelets by anticoagulation protocol: Yes   Plan:  1. Continue heparin at 1050 units/hr 2. Follow-up plans after cath 3. Monitor CBC and heparin level  Kannan Proia D. Maveric Debono, PharmD Clinical Pharmacist Pager: [phone] 06/22/2012 11:06 AM

## 2012-06-23 LAB — COMPREHENSIVE METABOLIC PANEL
ALT: 354 U/L — ABNORMAL HIGH (ref 0–35)
BUN: 22 mg/dL (ref 6–23)
CO2: 27 mEq/L (ref 19–32)
Calcium: 9.6 mg/dL (ref 8.4–10.5)
Creatinine, Ser: 1.22 mg/dL — ABNORMAL HIGH (ref 0.50–1.10)
GFR calc Af Amer: 53 mL/min — ABNORMAL LOW (ref >90)
GFR calc non Af Amer: 46 mL/min — ABNORMAL LOW (ref >90)
Glucose, Bld: 98 mg/dL (ref 70–99)
Sodium: 139 mEq/L (ref 135–145)
Total Protein: 6.3 g/dL (ref 6.0–8.3)

## 2012-06-23 LAB — CBC
MCH: 31 pg (ref 26.0–34.0)
MCHC: 34.9 g/dL (ref 30.0–36.0)
Platelets: 166 10*3/uL (ref 150–400)
RDW: 13.8 % (ref 11.5–15.5)

## 2012-06-23 LAB — CARBOXYHEMOGLOBIN
O2 Saturation: 59.8 %
Total hemoglobin: 12.1 g/dL (ref 12.0–16.0)

## 2012-06-23 MED ORDER — LISINOPRIL 5 MG PO TABS
5.0000 mg | ORAL_TABLET | Freq: Two times a day (BID) | ORAL | Status: DC
  Administered 2012-06-23 – 2012-06-27 (×8): 5 mg via ORAL
  Filled 2012-06-23 (×10): qty 1

## 2012-06-23 MED ORDER — CARVEDILOL 3.125 MG PO TABS
3.1250 mg | ORAL_TABLET | Freq: Two times a day (BID) | ORAL | Status: DC
  Administered 2012-06-23 – 2012-06-25 (×3): 3.125 mg via ORAL
  Filled 2012-06-23 (×9): qty 1

## 2012-06-23 NOTE — Progress Notes (Signed)
Pt has small bursts of PVCs/Vtach, non-sustained. PT is asymptomatic. Dr. Shirlee Latch notified. No orders received.

## 2012-06-23 NOTE — Progress Notes (Signed)
Patient ID: ALLEAN MONTFORT, female   DOB: 30-Nov-1945, 67 y.o.   MRN: [id number] Advanced Heart Failure Rounding Note   Subjective:    67 yo female with history of systolic heart failure in the setting of NICM, EF 35-40% previously now 15%, paroxysmal SVT and VT s/p Biotronik ICD followed by Dr. Ladona Ridgel.     Admitted for WCT likely due to SVT placed on amiodarone (stopped in ED) s/p elective cardioversion c/b by hypotension and flash pulmonary edema requiring IV lasix.  Now on dobutamine gtt.   RHC RA 10  RV 48/8  PA 42/16, mean 29  PCWP mean 14  LV 118/29  AO 122/82  Oxygen saturations:  PA 57%  AO 98%  Cardiac Output (Fick) 3.71  Cardiac Index (Fick) 2.0  PVR 4 WU  Cath yesterday with no angiographic CAD and filling pressures as noted above.  Dobutamine and IV Lasix stopped, now on po Lasix and digoxin.  Feels well, no chest pain or dyspnea. CVP 4 this am and co-ox 60%.   Objective:   Weight Range:  Vital Signs:   Temp:  [97.9 F (36.6 C)-99.8 F (37.7 C)] 99 F (37.2 C) (02/21 0322) Pulse Rate:  [35-88] 86 (02/20 1700) Resp:  [15-23] 21 (02/21 0325) BP: (94-141)/(42-88) 94/62 mmHg (02/21 0325) SpO2:  [94 %-100 %] 97 % (02/21 0325) Weight:  [177 lb 0.5 oz (80.3 kg)] 177 lb 0.5 oz (80.3 kg) (02/21 0500) Last BM Date: 06/17/12  Weight change: Filed Weights   06/21/12 2002 06/22/12 0500 06/23/12 0500  Weight: 187 lb 1.6 oz (84.868 kg) 186 lb 14.4 oz (84.777 kg) 177 lb 0.5 oz (80.3 kg)    Intake/Output:   Intake/Output Summary (Last 24 hours) at 06/23/12 0727 Last data filed at 06/23/12 0400  Gross per 24 hour  Intake  771.5 ml  Output   1725 ml  Net -953.5 ml     Physical Exam: General:  Well appearing. No resp difficulty HEENT: normal Neck: supple. JVP 7. Carotids 2+ bilat; no bruits. No lymphadenopathy or thryomegaly appreciated. Cor: PMI nondisplaced. Regular rate & rhythm. 2/6 TR. Lungs: clear Abdomen: soft, nontender, nondistended. No  hepatosplenomegaly. No bruits or masses. Good bowel sounds. Extremities: no cyanosis, clubbing, rash, no edema. Neuro: alert & orientedx3, cranial nerves grossly intact. moves all 4 extremities w/o difficulty. Affect pleasant  Telemetry: NSR  Labs: Basic Metabolic Panel:  Recent Labs Lab 06/18/12 0942 06/18/12 1340 06/19/12 0335 06/20/12 0330 06/21/12 0405 06/21/12 1600 06/22/12 0410 06/22/12 1244 06/23/12 0430  NA 143  --  141  141 142 139 138 141  --  139  K 5.0  --  4.2  4.1 3.3* 3.8 4.1 3.8  --  3.9  CL 107  --  110  108 105 103 102 103  --  102  CO2 17*  --  21  19 27 27 29 29   --  27  GLUCOSE 195*  --  116*  113* 110* 100* 108* 91  --  98  BUN 27*  --  29*  29* 24* 20 20 19   --  22  CREATININE 2.80* 2.72* 2.49*  2.48* 1.59* 1.13* 1.21* 1.23* 1.15* 1.22*  CALCIUM 9.9  --  8.4  8.6 8.6 9.0 9.6 9.8  --  9.6  MG 2.4 1.8 1.9 1.7  --   --   --   --   --   PHOS  --   --  4.0  --   --   --   --   --   --  Liver Function Tests:  Recent Labs Lab 06/19/12 0335 06/20/12 0330 06/21/12 0405 06/22/12 0410 06/23/12 0430  AST 838* 425* 211* 108* 82*  ALT 948* 774* 624* 487* 354*  ALKPHOS 70 70 75 91 89  BILITOT 0.9 1.8* 1.6* 1.2 0.9  PROT 5.6* 5.9* 6.1 6.4 6.3  ALBUMIN 3.4* 3.2* 3.1* 3.3* 3.1*   No results found for this basename: LIPASE, AMYLASE,  in the last 168 hours No results found for this basename: AMMONIA,  in the last 168 hours  CBC:  Recent Labs Lab 06/18/12 0942  06/20/12 0330 06/21/12 0405 06/22/12 0410 06/22/12 1244 06/23/12 0430  WBC 11.1*  < > 14.1* 10.0 10.3 8.8 8.8  NEUTROABS 8.8*  --   --   --   --   --   --   HGB 13.1  < > 11.1* 11.1* 12.1 12.5 12.3  HCT 40.2  < > 32.9* 33.1* 36.1 36.0 35.2*  MCV 92.8  < > 90.6 91.2 90.7 90.2 88.7  PLT 151  < > 109* 113* 152 166 166  < > = values in this interval not displayed.  Cardiac Enzymes:  Recent Labs Lab 06/18/12 1323 06/19/12 0025  TROPONINI 9.87* >20.00*    BNP: BNP (last 3  results)  Recent Labs  06/19/12 0800  PROBNP 16784.0*      Imaging: No results found.   Medications:     Scheduled Medications: . aspirin EC  81 mg Oral Daily  . carvedilol  3.125 mg Oral BID WC  . digoxin  0.125 mg Oral Daily  . furosemide  40 mg Oral Daily  . heparin  5,000 Units Subcutaneous Q8H  . lidocaine   Urethral Once  . lisinopril  5 mg Oral BID  . potassium chloride  40 mEq Oral Daily  . sodium chloride  500 mL Intravenous Once  . sodium chloride  3 mL Intravenous Q12H  . sodium chloride  3 mL Intravenous Q12H  . spironolactone  25 mg Oral Daily    Infusions: . sodium chloride 25 mL/hr at 06/20/12 2000    PRN Medications: sodium chloride, sodium chloride, acetaminophen, nitroGLYCERIN, ondansetron (ZOFRAN) IV, sodium chloride, sodium chloride   Assessment:   1. Cardiogenic shock  2. Acute on chronic systolic HF, EF 15%  (?ICM with troponin 20) 3. NSTEMI 4. Acute on chronic renal failure, improving 5. Hypokalemia  6. Shock liver 7. Hypomagnesemia  8. SVT   Plan/Discussion:     1. Acute on chronic systolic CHF: EF down 15% with cardiogenic shock at admission and pulmonary edema.  She has responded well to dobutamine and IV lasix with good diuresis.  Renal function improved.  CVP down to 4 this morning.  Reasonable co-ox today. Nonischemic CMP with no CAD on cath yesterday => very elevated TnI likely due to cardiogenic shock in the setting of SVT.  - Continue current po Lasix and digoxin.   - Increase lisinopril to 5 mg bid and add Coreg 3.125 mg bid.  - Patient has intermittent [name] (alternates with incomplete RBBB).  If this becomes persistent, would consider CRT upgrade. Will get ECG today.  2. CAD: No CAD on cath.  Elevated troponin likely demand ischemia in setting of hypotension with SVT.  3. SVT: No further SVT.  Had device reprogrammed for more effective ATP.  Will need to consult with Dr. Ladona Ridgel regarding timing for ablation.  4. Renal: AKI  in setting of cardiogenic shock.  Improved with better hemodynamics.   5. Elevated  transaminases: Likely shock liver in setting of hypotension.  Trending down.   6. Transfer to floor, out of bed.   [name] 06/23/2012 7:27 AM

## 2012-06-23 NOTE — Progress Notes (Signed)
Pt transferred to [id number]. Report called ahead to [facility]. All personal belongings taken with pt and daughter. Pt transported on monitor.

## 2012-06-24 LAB — CBC
HCT: 38.5 % (ref 36.0–46.0)
MCH: 30.4 pg (ref 26.0–34.0)
MCV: 89.3 fL (ref 78.0–100.0)
RBC: 4.31 MIL/uL (ref 3.87–5.11)
RDW: 13.8 % (ref 11.5–15.5)
WBC: 9.2 10*3/uL (ref 4.0–10.5)

## 2012-06-24 LAB — BASIC METABOLIC PANEL
BUN: 17 mg/dL (ref 6–23)
CO2: 27 mEq/L (ref 19–32)
Calcium: 10.1 mg/dL (ref 8.4–10.5)
Chloride: 102 mEq/L (ref 96–112)
Creatinine, Ser: 1.16 mg/dL — ABNORMAL HIGH (ref 0.50–1.10)

## 2012-06-24 NOTE — Progress Notes (Addendum)
Subjective:  She feels a little dizzy this morning, no syncope. No shortness of breath currently, no chest pain.  Objective:  Vital Signs in the last 24 hours: Temp:  [97.2 F (36.2 C)-99.1 F (37.3 C)] 98.4 F (36.9 C) (02/22 0618) Pulse Rate:  [71-85] 85 (02/22 0958) Resp:  [18-20] 20 (02/22 0618) BP: (91-120)/(45-69) 113/65 mmHg (02/22 0958) SpO2:  [96 %-99 %] 99 % (02/22 0958) Weight:  [78.881 kg (173 lb 14.4 oz)-80.3 kg (177 lb 0.5 oz)] 78.881 kg (173 lb 14.4 oz) (02/22 0618)  Intake/Output from previous day: 02/21 0701 - 02/22 0700 In: 740 [P.O.:565; I.V.:175] Out: 450 [Urine:450]   Physical Exam: General: Well developed, well nourished, in no acute distress. Laying flat in bed without any difficulty. Head:  Normocephalic and atraumatic. Lungs: Clear to auscultation and percussion. Heart: Normal S1 and S2, occasional ectopy.  No murmur, rubs or gallops.  Abdomen: soft, non-tender, positive bowel sounds. Extremities: No clubbing or cyanosis. No edema. Neurologic: Alert and oriented x 3.    Lab Results:  Recent Labs  06/23/12 0430 06/24/12 0450  WBC 8.8 9.2  HGB 12.3 13.1  PLT 166 185    Recent Labs  06/23/12 0430 06/24/12 0450  NA 139 139  K 3.9 4.4  CL 102 102  CO2 27 27  GLUCOSE 98 98  BUN 22 17  CREATININE 1.22* 1.16*   No results found for this basename: TROPONINI, CK, MB,  in the last 72 hours Hepatic Function Panel  Recent Labs  06/23/12 0430  PROT 6.3  ALBUMIN 3.1*  AST 82*  ALT 354*  ALKPHOS 89  BILITOT 0.9  .   Telemetry: Sinus rhythm with occasional PVCs. Personally viewed.    Cardiac Studies:  Ejection fraction 15%.  Assessment/Plan:  Principal Problem:   Cardiogenic shock Active Problems:   Cardiomyopathy, nonischemic   SVT (supraventricular tachycardia)   AUTOMATIC IMPLANTABLE CARDIAC DEFIBRILLATOR SITU   Acute renal failure   Hyperglycemia, no prior hx of DM documented   [name] (left bundle branch block)  Wide-complex tachycardia   Pulmonary edema   67 year old female with recent episode of cardiogenic shock, wide-complex tachycardia likely due to SVT, cardioversion, ejection fraction now 15% down from 40%, Biotronik ICD-Dr. Ladona Ridgel.  1. Acute on chronic systolic heart failure - previously on dobutamine as well as IV Lasix. Cardiogenic shock likely precipitated by wide-complex tachycardia/SVT. This morning, feel slight dizziness. Her blood pressure currently is 113/65. She is receiving furosemide 40 mg once a day. Yesterday she was net +290 mL. Her weight has decreased from 85.8 kg down to 78.8 kg. Her CVP was 4 upon last check. Creatinine is 1.16. I will hold her Lasix today. We'll continue to try her low-dose beta blocker/ACE inhibitor. Continue with low-dose digoxin as well. I will hold potassium supplementation as well. Last check 4.4. I will continue Aldactone 25 mg once a day.  2. SVT-device has been reprogrammed. We'll need to consult with Dr. Ladona Ridgel regarding timing for ablation.  3. Acute kidney injury resolved.  4. Elevated liver function/transaminases-improved. Shock liver.   We will continue to monitor closely. I have reviewed previous medical records, hemodynamic recordings, telemetry.  SKAINS, MARK 06/24/2012, 10:12 AM

## 2012-06-25 LAB — BASIC METABOLIC PANEL
BUN: 18 mg/dL (ref 6–23)
Calcium: 10.1 mg/dL (ref 8.4–10.5)
Creatinine, Ser: 1.15 mg/dL — ABNORMAL HIGH (ref 0.50–1.10)
GFR calc Af Amer: 57 mL/min — ABNORMAL LOW (ref >90)
GFR calc non Af Amer: 49 mL/min — ABNORMAL LOW (ref >90)

## 2012-06-25 LAB — CBC
HCT: 36.8 % (ref 36.0–46.0)
MCH: 31 pg (ref 26.0–34.0)
MCHC: 34.8 g/dL (ref 30.0–36.0)
MCV: 89.1 fL (ref 78.0–100.0)
RDW: 14 % (ref 11.5–15.5)

## 2012-06-25 NOTE — Progress Notes (Signed)
Subjective:  She is still having some mild dizziness this morning. Her blood pressure is 1:15 systolic. Yesterday she felt similar, and I held her Lasix. She is continuing to get her Aldactone. Throughout the day yesterday, she felt better.  Objective:  Vital Signs in the last 24 hours: Temp:  [97.4 F (36.3 C)-98.2 F (36.8 C)] 98.1 F (36.7 C) (02/23 0558) Pulse Rate:  [72-88] 73 (02/23 0558) Resp:  [19-20] 20 (02/23 0558) BP: (98-115)/(52-61) 115/61 mmHg (02/23 0558) SpO2:  [99 %-100 %] 100 % (02/23 0558) Weight:  [78.6 kg (173 lb 4.5 oz)] 78.6 kg (173 lb 4.5 oz) (02/23 0558)  Intake/Output from previous day: 02/22 0701 - 02/23 0700 In: 1040 [P.O.:1040] Out: 1225 [Urine:1225]   Physical Exam: General: Well developed, well nourished, in no acute distress. Laying flat without difficulty. Head:  Normocephalic and atraumatic. Lungs: Clear to auscultation and percussion. Heart: Normal S1 and S2.  Occasional ectopy No murmur, rubs or gallops.  Abdomen: soft, non-tender, positive bowel sounds. Extremities: No clubbing or cyanosis. No edema. Neurologic: Alert and oriented x 3.    Lab Results:  Recent Labs  06/24/12 0450 06/25/12 0530  WBC 9.2 10.4  HGB 13.1 12.8  PLT 185 213    Recent Labs  06/24/12 0450 06/25/12 0530  NA 139 135  K 4.4 4.0  CL 102 101  CO2 27 24  GLUCOSE 98 92  BUN 17 18  CREATININE 1.16* 1.15*   No results found for this basename: TROPONINI, CK, MB,  in the last 72 hours Hepatic Function Panel  Recent Labs  06/23/12 0430  PROT 6.3  ALBUMIN 3.1*  AST 82*  ALT 354*  ALKPHOS 89  BILITOT 0.9     Telemetry: Occasional paced rhythm, PVCs, multifocal Personally viewed.  Cardiac Studies:  Ejection fraction 15%  Assessment/Plan:  Principal Problem:   Cardiogenic shock Active Problems:   Cardiomyopathy, nonischemic   SVT (supraventricular tachycardia)   AUTOMATIC IMPLANTABLE CARDIAC DEFIBRILLATOR SITU   Acute renal failure  Hyperglycemia, no prior hx of DM documented   [name] (left bundle branch block)   Wide-complex tachycardia   Pulmonary edema  67 year old female with recent episode of cardiogenic shock, wide-complex tachycardia likely due to SVT, cardioversion, ejection fraction now 15% down from 40%, Biotronik ICD-Dr. Ladona Ridgel.   1. Acute on chronic systolic heart failure - previously on dobutamine as well as IV Lasix. Cardiogenic shock likely precipitated by wide-complex tachycardia/SVT. This morning, feel slight dizziness once again. Yesterday, her dizziness seemed to resolve throughout the day. Her blood pressure currently is 115/61. She is receiving furosemide 40 mg once a day. Yesterday she was net - , essentially even. Her weight has decreased from 85.8 kg down to 78.6 kg. Her CVP was 4 upon last check. Creatinine is 1.16. I have held her Lasix from yesterday. We'll continue to try her low-dose beta blocker/ACE inhibitor. Continue with low-dose digoxin as well. I will hold potassium supplementation as well. Last check 4.0. I will continue Aldactone 25 mg once a day.   2. SVT-device has been reprogrammed. We'll need to consult with Dr. Ladona Ridgel regarding timing for ablation.   3. Acute kidney injury resolved.   4. Elevated liver function/transaminases-improved. Shock liver.   5. Telemetry reviewed, occasional PVCs, paced rhythm intermittently  We will continue to monitor closely. I have reviewed previous medical records, hemodynamic recordings, telemetry.     SKAINS, MARK 06/25/2012, 10:26 AM

## 2012-06-26 ENCOUNTER — Encounter (HOSPITAL_COMMUNITY): Payer: Self-pay | Admitting: Nurse Practitioner

## 2012-06-26 DIAGNOSIS — I5023 Acute on chronic systolic (congestive) heart failure: Secondary | ICD-10-CM

## 2012-06-26 DIAGNOSIS — K7200 Acute and subacute hepatic failure without coma: Secondary | ICD-10-CM

## 2012-06-26 DIAGNOSIS — I959 Hypotension, unspecified: Secondary | ICD-10-CM

## 2012-06-26 LAB — COMPREHENSIVE METABOLIC PANEL
AST: 51 U/L — ABNORMAL HIGH (ref 0–37)
Albumin: 3.5 g/dL (ref 3.5–5.2)
Alkaline Phosphatase: 99 U/L (ref 39–117)
BUN: 18 mg/dL (ref 6–23)
CO2: 24 mEq/L (ref 19–32)
Chloride: 105 mEq/L (ref 96–112)
Creatinine, Ser: 1.14 mg/dL — ABNORMAL HIGH (ref 0.50–1.10)
GFR calc non Af Amer: 50 mL/min — ABNORMAL LOW (ref >90)
Potassium: 4.4 mEq/L (ref 3.5–5.1)
Total Bilirubin: 0.7 mg/dL (ref 0.3–1.2)

## 2012-06-26 LAB — CBC
HCT: 38.4 % (ref 36.0–46.0)
MCV: 90.4 fL (ref 78.0–100.0)
RBC: 4.25 MIL/uL (ref 3.87–5.11)
RDW: 14.2 % (ref 11.5–15.5)
WBC: 10.3 10*3/uL (ref 4.0–10.5)

## 2012-06-26 LAB — CULTURE, BLOOD (ROUTINE X 2): Culture: NO GROWTH

## 2012-06-26 MED ORDER — LISINOPRIL 5 MG PO TABS: 5.0000 mg | ORAL_TABLET | Freq: Two times a day (BID) | ORAL | Status: DC

## 2012-06-26 MED ORDER — FUROSEMIDE 40 MG PO TABS
40.0000 mg | ORAL_TABLET | Freq: Every day | ORAL | Status: DC
  Administered 2012-06-26 – 2012-06-27 (×2): 40 mg via ORAL
  Filled 2012-06-26 (×2): qty 1

## 2012-06-26 MED ORDER — DIGOXIN 125 MCG PO TABS: 0.1250 mg | ORAL_TABLET | Freq: Every day | ORAL | Status: DC

## 2012-06-26 MED ORDER — CARVEDILOL 6.25 MG PO TABS: 6.2500 mg | ORAL_TABLET | Freq: Two times a day (BID) | ORAL | Status: DC

## 2012-06-26 MED ORDER — CARVEDILOL 6.25 MG PO TABS
6.2500 mg | ORAL_TABLET | Freq: Two times a day (BID) | ORAL | Status: DC
  Administered 2012-06-26 – 2012-06-27 (×3): 6.25 mg via ORAL
  Filled 2012-06-26 (×5): qty 1

## 2012-06-26 MED ORDER — SPIRONOLACTONE 25 MG PO TABS: 25.0000 mg | ORAL_TABLET | Freq: Every day | ORAL | Status: DC

## 2012-06-26 NOTE — Progress Notes (Signed)
06/26/12 1615 In to speak with pt. about [facility] services.  Gave pt. list of agencies, and pt. chose [facility]. TC to Debbie, with [facility], to give referral for [facility] RN and HH PT.  Pt. to dc home today.   Tera Mater, RN, BSN NCM [phone]

## 2012-06-26 NOTE — Progress Notes (Signed)
Patient ID: Kaitlin [name], female   DOB: Sep 09, 1945, 67 y.o.   MRN: [id number] Advanced Heart Failure Rounding Note   Subjective:    67 yo female with history of systolic heart failure in the setting of NICM, EF 35-40% previously now 15%, paroxysmal SVT and VT s/p Biotronik ICD followed by Dr. Ladona Ridgel.     Admitted for WCT likely due to SVT placed on amiodarone (stopped in ED) s/p elective cardioversion c/b by hypotension and flash pulmonary edema requiring IV lasix and dobutamine gtt.  RHC RA 10  RV 48/8  PA 42/16, mean 29  PCWP mean 14  LV 118/29  AO 122/82  Oxygen saturations:  PA 57%  AO 98%  Cardiac Output (Fick) 3.71  Cardiac Index (Fick) 2.0  PVR 4 WU  Cath Thursday with no angiographic CAD and filling pressures as noted above.  Dobutamine and IV Lasix stopped, now on digoxin.  Lasix held over weekend.  Feels well, no chest pain or dyspnea. Good BP, mild lightheadedness when first stands in the am.   Objective:   Weight Range:  Vital Signs:   Temp:  [97.5 F (36.4 C)-98.2 F (36.8 C)] 97.9 F (36.6 C) (02/24 0541) Pulse Rate:  [76-83] 76 (02/24 0541) Resp:  [19-20] 20 (02/24 0541) BP: (103-111)/(60-62) 111/60 mmHg (02/24 0541) SpO2:  [98 %-100 %] 98 % (02/24 0541) Weight:  [173 lb 1 oz (78.5 kg)] 173 lb 1 oz (78.5 kg) (02/24 0541) Last BM Date: 06/23/12  Weight change: Filed Weights   06/24/12 0618 06/25/12 0558 06/26/12 0541  Weight: 173 lb 14.4 oz (78.881 kg) 173 lb 4.5 oz (78.6 kg) 173 lb 1 oz (78.5 kg)    Intake/Output:   Intake/Output Summary (Last 24 hours) at 06/26/12 0759 Last data filed at 06/26/12 0600  Gross per 24 hour  Intake    780 ml  Output   1900 ml  Net  -1120 ml     Physical Exam: General:  Well appearing. No resp difficulty HEENT: normal Neck: supple. JVP 7. Carotids 2+ bilat; no bruits. No lymphadenopathy or thryomegaly appreciated. Cor: PMI nondisplaced. Regular rate & rhythm. 2/6 TR. Lungs: clear Abdomen: soft,  nontender, nondistended. No hepatosplenomegaly. No bruits or masses. Good bowel sounds. Extremities: no cyanosis, clubbing, rash, no edema. Neuro: alert & orientedx3, cranial nerves grossly intact. moves all 4 extremities w/o difficulty. Affect pleasant  Telemetry: NSR  Labs: Basic Metabolic Panel:  Recent Labs Lab 06/20/12 0330  06/22/12 0410 06/22/12 1244 06/23/12 0430 06/24/12 0450 06/25/12 0530 06/26/12 0523  NA 142  < > 141  --  139 139 135 139  K 3.3*  < > 3.8  --  3.9 4.4 4.0 4.4  CL 105  < > 103  --  102 102 101 105  CO2 27  < > 29  --  27 27 24 24   GLUCOSE 110*  < > 91  --  98 98 92 92  BUN 24*  < > 19  --  22 17 18 18   CREATININE 1.59*  < > 1.23* 1.15* 1.22* 1.16* 1.15* 1.14*  CALCIUM 8.6  < > 9.8  --  9.6 10.1 10.1 10.4  MG 1.7  --   --   --   --   --   --   --   < > = values in this interval not displayed.  Liver Function Tests:  Recent Labs Lab 06/20/12 0330 06/21/12 0405 06/22/12 0410 06/23/12 0430 06/26/12 0523  AST 425*  211* 108* 82* 51*  ALT 774* 624* 487* 354* 207*  ALKPHOS 70 75 91 89 99  BILITOT 1.8* 1.6* 1.2 0.9 0.7  PROT 5.9* 6.1 6.4 6.3 6.7  ALBUMIN 3.2* 3.1* 3.3* 3.1* 3.5   No results found for this basename: LIPASE, AMYLASE,  in the last 168 hours No results found for this basename: AMMONIA,  in the last 168 hours  CBC:  Recent Labs Lab 06/22/12 1244 06/23/12 0430 06/24/12 0450 06/25/12 0530 06/26/12 0523  WBC 8.8 8.8 9.2 10.4 10.3  HGB 12.5 12.3 13.1 12.8 12.8  HCT 36.0 35.2* 38.5 36.8 38.4  MCV 90.2 88.7 89.3 89.1 90.4  PLT 166 166 185 213 243    Cardiac Enzymes: No results found for this basename: CKTOTAL, CKMB, CKMBINDEX, TROPONINI,  in the last 168 hours  BNP: BNP (last 3 results)  Recent Labs  06/19/12 0800  PROBNP 16784.0*      Imaging: No results found.   Medications:     Scheduled Medications: . aspirin EC  81 mg Oral Daily  . carvedilol  6.25 mg Oral BID WC  . digoxin  0.125 mg Oral Daily  .  furosemide  40 mg Oral Daily  . heparin  5,000 Units Subcutaneous Q8H  . lidocaine   Urethral Once  . lisinopril  5 mg Oral BID  . sodium chloride  500 mL Intravenous Once  . sodium chloride  3 mL Intravenous Q12H  . spironolactone  25 mg Oral Daily    Infusions: . sodium chloride Stopped (06/23/12 1100)    PRN Medications: sodium chloride, acetaminophen, nitroGLYCERIN, ondansetron (ZOFRAN) IV, sodium chloride   Assessment:   1. Cardiogenic shock  2. Acute on chronic systolic HF, EF 15%  (?ICM with troponin 20) 3. NSTEMI 4. Acute on chronic renal failure, improving 5. Hypokalemia  6. Shock liver 7. Hypomagnesemia  8. SVT   Plan/Discussion:     1. Acute on chronic systolic CHF: EF down 15% with cardiogenic shock at admission and pulmonary edema.  She has responded well to dobutamine and IV lasix with good diuresis.  Renal function improved.  CVP down to 4 when last checked. 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  - Start po Lasix regimen for home.  - Increase Coreg to 6.25 mg bid this morning.   - Patient has intermittent [name] (alternates with incomplete RBBB).  If this becomes persistent, would consider CRT upgrade. Will get ECG today prior to discharge.  2. CAD: No CAD on cath.  Elevated troponin likely demand ischemia in setting of hypotension with SVT.  3. SVT: No further SVT.  Had device reprogrammed for more effective ATP.  I talked to Dr. Ladona Ridgel, would like to see her in about a week after groin has healed from cath to set up for SVT ablation.  4. Renal: AKI in setting of cardiogenic shock.  Improved with better hemodynamics.   5. Elevated transaminases: Likely shock liver in setting of hypotension.  Trending down.   6. Dispo: Home today.  She needs followup in about a week with Dr. Ladona Ridgel for SVT ablation and with me in about a week for CHF (may overbook into my clinic).  Meds: Coreg 6.25 mg bid,  lisinopril 5 mg bid, digoxin 0.125 daily, spironolactone 25 mg daily, Lasix 40 daily, ASA 81 daily.    [name] 06/26/2012 7:59 AM

## 2012-06-26 NOTE — Discharge Summary (Addendum)
Patient ID: Kaitlin [name],  MRN: [id number], DOB/AGE: [birth year] 67 y.o.  Admit date: 06/18/2012 Discharge date: 06/27/2012  Primary Care Provider: Evlyn Courier Primary Cardiologist: Golden Circle, MD  Discharge Diagnoses Principal Problem:   Cardiogenic shock  **Hypotension requiring vasopressor and inotropic therapy - resolved.  Active Problems:   Cardiomyopathy, nonischemic  **EF 15% by echo this admission.  **Nonobstructive CAD by cath this admission.   SVT (supraventricular tachycardia) with WCT  **Resolved, EP f/u pending.   Acute on chronic systolic CHF (congestive heart failure) with pulmonary edema  **S/P diuresis this admission of 5.2 L with reduction in weight from 185 pounds on admission to 173 pounds at discharge.   Hypotension   Acute renal failure  **In setting of shock/hypotension - resolved.   AUTOMATIC IMPLANTABLE CARDIAC DEFIBRILLATOR SITU   Hyperglycemia, no prior hx of DM documented   [name] (left bundle branch block) - intermittent   Shock liver  **In setting of shock/hypotension - resolved.  Allergies No Known Allergies  Procedures  2D Echocardiogram 2.16.2014  Study Conclusions  - Left ventricle: The cavity size was mildly dilated. Wall   thickness was normal. Systolic function was severely   reduced. The estimated ejection fraction was 15%. Moderate   to severe hypokinesis of the anterolateral and   inferolateral myocardium. Akinesis of the inferior   myocardium. - Ventricular septum: Septal motion showed paradox. - Aortic valve: Mildly calcified annulus. - Mitral valve: Calcified annulus. Mild to moderate   regurgitation. - Left atrium: The atrium was mildly dilated. - Right ventricle: The cavity size was moderately dilated.   Systolic function was mildly to moderately reduced. - Right atrium: The atrium was mildly to moderately dilated. - Atrial septum: No defect or patent foramen ovale was   identified. - Tricuspid valve: Moderate  regurgitation. - Pulmonary arteries: PA peak pressure: 46mm Hg (S). _____________  Right and Left Heart Cardiac Catheterization 2.20.2014  Procedural Findings: Hemodynamics (mmHg) RA 10 RV 48/8 PA 42/16, mean 29 PCWP mean 14 LV 118/29 AO 122/82  Oxygen saturations: PA 57% AO 98%  Cardiac Output (Fick) 3.71  Cardiac Index (Fick) 2.0 PVR 4 WU  Cardiac Output (Thermo) 2.45 Cardiac Index (Fick) 1.3            Coronary angiography: Coronary dominance: right  Left mainstem: No angiographic CAD. Left anterior descending (LAD): Luminal irregularities. Left circumflex (LCx): No angiographic CAD.  Right coronary artery (RCA): No angiographic CAD.  Left ventriculography: Not done, recent echo and recent AKI so trying to limit contrast.   _____________  History of Present Illness  67 year old female with prior history of nonischemic cardiomyopathy and ventricular tachycardia status post AICD placement. She also has a history of wide complex supraventricular tachycardia which resulted in anti-tachycardia pacing in the past, requiring adjustment of her ICD parameters. On the morning of February 16, she presented to the ED secondary to a 12 hour history of dizziness and was found to be in a wide-complex tachycardia at the rate of 180 beats per minute period she was initially treated with intravenous amiodarone without significant benefit she was subsequently cardioverted. Following cardioversion, she returned to an AV paced rhythm but was hypotensive and obtunded. Lab work revealed a creatinine of 2.8 and a troponin in excess of 5. She was placed on IV norepinephrine and dobutamine and admitted by cardiology for further evaluation.  Hospital Course  Patient was transferred to the coronary intensive care unit where within a few hours, her mental status normalized. It was  felt that change in mental status was related to peri-cardioversion medications. Echocardiogram was performed on day of  admission and revealed an EF of 15% which was down from prior known EF in the fall of 2013. Troponin also continued to rise eventually peaked at greater than 20. She remained on vasopressor and inotropic therapy with improvement in blood pressure. On February 17, she complained of dyspnea and her CVP was assessed and found to be 18. She was placed on BiPAP and treated with intravenous Lasix. With improvement in blood pressure, norepinephrine infusion was weaned. She remained on dobutamine therapy and diuresed well (total of 5.2 liters).  She was seen by electrophysiology in consultation on February 17, it was felt that her wide-complex tachycardia represented supraventricular tachycardia rather than ventricular tachycardia.   With significant rise in troponin, it was felt that she would require diagnostic catheterization, once her renal function improved, to rule out obstructive coronary artery disease as a cause for this event. Further, it was felt that she would likely benefit from SVT ablation once she recovered from this event. Lastly, it was noted that she had an intermittent left bundle branch block and that in the setting of class 3-4 heart failure consideration toward CRT upgrade should be had.  With continued diuresis, patient had clinical improvement as well as improvement in renal function. Co-oximetry ran in the 70-80% range and CVP improved.  Dobutamine was discontinued on February 20 and digoxin therapy was started.  With improved renal function, she underwent right and left heart diagnostic catheterization on February 20 revealing relatively normal coronary arteries and only mild elevation in right heart pressures. She was switched from IV to oral diuretics. Beta blocker and ACE inhibitor doses were titrated and spironolactone was also initiated. Renal function remained stable post catheterization.  On Feb. 22nd, she was experiencing orthostasis and dizziness and her Lasix dose was held over the  weekend. This morning, she is feeling well and has remained hemodynamically stable. She will be discharged home today in good condition. We have resumed her previous home Lasix dose and have arranged for early followup with both Dr. Shirlee Latch and Dr. Ladona Ridgel (to discuss SVT ablation).  Discharge Vitals Blood pressure 97/60, pulse 98, temperature 98.3 F (36.8 C), temperature source Oral, resp. rate 19, height 5\' 3"  (1.6 m), weight 172 lb (78.019 kg), SpO2 98.00%.  Filed Weights   06/25/12 0558 06/26/12 0541 06/27/12 0435  Weight: 173 lb 4.5 oz (78.6 kg) 173 lb 1 oz (78.5 kg) 172 lb (78.019 kg)   Labs  CBC  Recent Labs  06/26/12 0523 06/27/12 0655  WBC 10.3 15.3*  HGB 12.8 13.8  HCT 38.4 40.1  MCV 90.4 89.7  PLT 243 335   Basic Metabolic Panel  Recent Labs  06/26/12 0523 06/27/12 0655  NA 139 137  K 4.4 4.0  CL 105 101  CO2 24 22  GLUCOSE 92 114*  BUN 18 21  CREATININE 1.14* 1.19*  CALCIUM 10.4 10.7*   Liver Function Tests  Recent Labs  06/26/12 0523  AST 51*  ALT 207*  ALKPHOS 99  BILITOT 0.7  PROT 6.7  ALBUMIN 3.5   Cardiac Enzymes Lab Results  Component Value Date   TROPONINI >20.00* 06/19/2012   Thyroid Function Tests Lab Results  Component Value Date   TSH 1.641 06/18/2012   Disposition  Pt is being discharged home today in good condition.  Follow-up Plans & Appointments      Follow-up Information   Follow up On  07/04/2012. (11:30 AM)       Follow up with Lewayne Bunting, MD On 07/11/2012. (12:00 PM)    Contact information:   [address] [place] [address] [phone]      Discharge Medications    Medication List    STOP taking these medications       benazepril 10 MG tablet  Commonly known as:  LOTENSIN     fish oil-omega-3 fatty acids 1000 MG capsule     multivitamin capsule     potassium chloride 20 MEQ packet  Commonly known as:  KLOR-CON     potassium chloride SA 20 MEQ tablet  Commonly known as:   K-DUR,KLOR-CON      TAKE these medications       acetaminophen 325 MG tablet  Commonly known as:  TYLENOL  Take 650 mg by mouth every 6 (six) hours as needed for pain.     aspirin EC 81 MG tablet  Take 81 mg by mouth daily.     B-12 PO  Take 1 tablet by mouth daily.     carvedilol 3.125 MG tablet  Commonly known as:  COREG  Take 1 tablet (3.125 mg total) by mouth 2 (two) times daily with a meal.     digoxin 0.125 MG tablet  Commonly known as:  LANOXIN  Take 1 tablet (0.125 mg total) by mouth daily.     furosemide 40 MG tablet  Commonly known as:  LASIX  Take 40 mg by mouth daily.     lisinopril 5 MG tablet  Commonly known as:  PRINIVIL,ZESTRIL  Take 1 tablet (5 mg total) by mouth 2 (two) times daily.     multivitamin with minerals Tabs  Take 1 tablet by mouth daily.     omega-3 acid ethyl esters 1 G capsule  Commonly known as:  LOVAZA  Take 2 g by mouth 2 (two) times daily.     simvastatin 40 MG tablet  Commonly known as:  ZOCOR  Take 40 mg by mouth at bedtime.     spironolactone 25 MG tablet  Commonly known as:  ALDACTONE  Take 1 tablet (25 mg total) by mouth daily.      Outstanding Labs/Studies  None  Duration of Discharge Encounter   Greater than 30 minutes including physician time.  Bobbye Riggs Rees Matura NP 06/27/2012, 7:02 PM   Initially, the plan was for Ms. Birky to be discharged home on 06/26/2012.  However, she became hypotensive and was held overnight.  On 06/27/2012, Ms. Kitchens was evaluated by Dr. Shirlee Latch, physical therapy and occupational therapy.  Medically she was stable for discharge on 06/27/2012, with close outpatient followup arranged.  Theodore Demark, PA 06/27/2012 7:02 PM

## 2012-06-26 NOTE — Progress Notes (Signed)
Brief Nutrition Note  Malnutrition Screening Tool result is inaccurate.  Please consult if nutrition needs are identified.  Kenlei Safi Kowalski RD, LDN Pager #[phone] After Hours pager #[phone]   

## 2012-06-27 ENCOUNTER — Encounter: Payer: Self-pay | Admitting: *Deleted

## 2012-06-27 ENCOUNTER — Other Ambulatory Visit: Payer: Self-pay | Admitting: *Deleted

## 2012-06-27 ENCOUNTER — Other Ambulatory Visit: Payer: Self-pay

## 2012-06-27 DIAGNOSIS — I471 Supraventricular tachycardia: Secondary | ICD-10-CM

## 2012-06-27 DIAGNOSIS — Z01812 Encounter for preprocedural laboratory examination: Secondary | ICD-10-CM

## 2012-06-27 DIAGNOSIS — I472 Ventricular tachycardia: Secondary | ICD-10-CM

## 2012-06-27 LAB — CBC
MCH: 30.9 pg (ref 26.0–34.0)
MCV: 89.7 fL (ref 78.0–100.0)
Platelets: 335 10*3/uL (ref 150–400)
RBC: 4.47 MIL/uL (ref 3.87–5.11)
RDW: 14 % (ref 11.5–15.5)
WBC: 15.3 10*3/uL — ABNORMAL HIGH (ref 4.0–10.5)

## 2012-06-27 LAB — BASIC METABOLIC PANEL
Calcium: 10.7 mg/dL — ABNORMAL HIGH (ref 8.4–10.5)
Creatinine, Ser: 1.19 mg/dL — ABNORMAL HIGH (ref 0.50–1.10)
GFR calc Af Amer: 55 mL/min — ABNORMAL LOW (ref >90)
Sodium: 137 mEq/L (ref 135–145)

## 2012-06-27 MED ORDER — CARVEDILOL 6.25 MG PO TABS: 3.1250 mg | ORAL_TABLET | Freq: Two times a day (BID) | ORAL | Status: DC

## 2012-06-27 MED ORDER — CARVEDILOL 3.125 MG PO TABS: 3.1250 mg | ORAL_TABLET | Freq: Two times a day (BID) | ORAL | Status: DC

## 2012-06-27 NOTE — Progress Notes (Signed)
Patient ID: Kaitlin [name], female   DOB: 08-14-1945, 67 y.o.   MRN: [id number] Advanced Heart Failure Rounding Note   Subjective:    67 yo female with history of systolic heart failure in the setting of NICM, EF 35-40% previously now 15%, paroxysmal SVT and VT s/p Biotronik ICD followed by Dr. Ladona Ridgel.     Admitted for WCT likely due to SVT placed on amiodarone (stopped in ED) s/p elective cardioversion c/b by hypotension and flash pulmonary edema requiring IV lasix and dobutamine gtt.  RHC RA 10  RV 48/8  PA 42/16, mean 29  PCWP mean 14  LV 118/29  AO 122/82  Oxygen saturations:  PA 57%  AO 98%  Cardiac Output (Fick) 3.71  Cardiac Index (Fick) 2.0  PVR 4 WU  Cath Thursday with no angiographic CAD and filling pressures as noted above.  Dobutamine and IV Lasix stopped, now on digoxin. Feels well, no chest pain or dyspnea. BP is stable, did well walking yesterday.  Not discharged because daughter concerned about her being home alone.   Objective:   Weight Range:  Vital Signs:   Temp:  [97.4 F (36.3 C)-97.9 F (36.6 C)] 97.4 F (36.3 C) (02/25 0435) Pulse Rate:  [67-89] 67 (02/25 0435) Resp:  [20] 20 (02/25 0435) BP: (101-110)/(54-71) 101/54 mmHg (02/25 0435) SpO2:  [97 %-98 %] 97 % (02/25 0435) Weight:  [172 lb (78.019 kg)] 172 lb (78.019 kg) (02/25 0435) Last BM Date: 06/25/12  Weight change: Filed Weights   06/25/12 0558 06/26/12 0541 06/27/12 0435  Weight: 173 lb 4.5 oz (78.6 kg) 173 lb 1 oz (78.5 kg) 172 lb (78.019 kg)    Intake/Output:   Intake/Output Summary (Last 24 hours) at 06/27/12 6962 Last data filed at 06/27/12 0600  Gross per 24 hour  Intake   1020 ml  Output   2700 ml  Net  -1680 ml     Physical Exam: General:  Well appearing. No resp difficulty HEENT: normal Neck: supple. JVP 7. Carotids 2+ bilat; no bruits. No lymphadenopathy or thryomegaly appreciated. Cor: PMI nondisplaced. Regular rate & rhythm. 2/6 TR. Lungs: clear Abdomen: soft,  nontender, nondistended. No hepatosplenomegaly. No bruits or masses. Good bowel sounds. Extremities: no cyanosis, clubbing, rash, no edema. Neuro: alert & orientedx3, cranial nerves grossly intact. moves all 4 extremities w/o difficulty. Affect pleasant  Telemetry: NSR  Labs: Basic Metabolic Panel:  Recent Labs Lab 06/22/12 0410 06/22/12 1244 06/23/12 0430 06/24/12 0450 06/25/12 0530 06/26/12 0523  NA 141  --  139 139 135 139  K 3.8  --  3.9 4.4 4.0 4.4  CL 103  --  102 102 101 105  CO2 29  --  27 27 24 24   GLUCOSE 91  --  98 98 92 92  BUN 19  --  22 17 18 18   CREATININE 1.23* 1.15* 1.22* 1.16* 1.15* 1.14*  CALCIUM 9.8  --  9.6 10.1 10.1 10.4    Liver Function Tests:  Recent Labs Lab 06/21/12 0405 06/22/12 0410 06/23/12 0430 06/26/12 0523  AST 211* 108* 82* 51*  ALT 624* 487* 354* 207*  ALKPHOS 75 91 89 99  BILITOT 1.6* 1.2 0.9 0.7  PROT 6.1 6.4 6.3 6.7  ALBUMIN 3.1* 3.3* 3.1* 3.5   No results found for this basename: LIPASE, AMYLASE,  in the last 168 hours No results found for this basename: AMMONIA,  in the last 168 hours  CBC:  Recent Labs Lab 06/22/12 1244 06/23/12 0430 06/24/12 0450  06/25/12 0530 06/26/12 0523  WBC 8.8 8.8 9.2 10.4 10.3  HGB 12.5 12.3 13.1 12.8 12.8  HCT 36.0 35.2* 38.5 36.8 38.4  MCV 90.2 88.7 89.3 89.1 90.4  PLT 166 166 185 213 243    Cardiac Enzymes: No results found for this basename: CKTOTAL, CKMB, CKMBINDEX, TROPONINI,  in the last 168 hours  BNP: BNP (last 3 results)  Recent Labs  06/19/12 0800  PROBNP 16784.0*      Imaging: No results found.   Medications:     Scheduled Medications: . aspirin EC  81 mg Oral Daily  . carvedilol  6.25 mg Oral BID WC  . digoxin  0.125 mg Oral Daily  . furosemide  40 mg Oral Daily  . heparin  5,000 Units Subcutaneous Q8H  . lidocaine   Urethral Once  . lisinopril  5 mg Oral BID  . sodium chloride  500 mL Intravenous Once  . sodium chloride  3 mL Intravenous Q12H  .  spironolactone  25 mg Oral Daily    Infusions: . sodium chloride Stopped (06/23/12 1100)    PRN Medications: sodium chloride, acetaminophen, nitroGLYCERIN, ondansetron (ZOFRAN) IV, sodium chloride   Assessment:   1. Cardiogenic shock  2. Acute on chronic systolic HF, EF 15%  (?ICM with troponin 20) 3. NSTEMI 4. Acute on chronic renal failure, improving 5. Hypokalemia  6. Shock liver 7. Hypomagnesemia  8. SVT   Plan/Discussion:     1. Acute on chronic systolic CHF: EF down 15% with cardiogenic shock at admission and pulmonary edema.  She has responded well to dobutamine and IV lasix with good diuresis.  Renal function improved.  CVP down to 4 when last checked. 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  - Started po Lasix regimen for home.  - Continue current doses of lisinopril, spironolactone, and Coreg.   - Patient has intermittent [name] (alternates with incomplete RBBB).  If this becomes persistent, would consider CRT upgrade. Will get ECG today prior to discharge.  2. CAD: No CAD on cath.  Elevated troponin likely demand ischemia in setting of hypotension with SVT.  3. SVT: No further SVT.  Had device reprogrammed for more effective ATP.  I talked to Dr. Ladona Ridgel, would like to see her in about a week after groin has healed from cath to set up for SVT ablation.  4. Renal: AKI in setting of cardiogenic shock.  Improved with better hemodynamics.   5. Elevated transaminases: Likely shock liver in setting of hypotension.  Trending down.   6. Dispo: Home today.  Will need home health, to be seen by PT/OT today for needs. She needs followup in about a week with Dr. Ladona Ridgel for SVT ablation and with me in about a week for CHF (may overbook into my clinic).  Meds: Coreg 6.25 mg bid, lisinopril 5 mg bid, digoxin 0.125 daily, spironolactone 25 mg daily, Lasix 40 daily, ASA 81 daily.    [name] 06/27/2012 7:22 AM

## 2012-06-27 NOTE — Progress Notes (Signed)
DC IV, DC Tele, DC Home. Discharge instructions and home medications discussed with patient and patient's family. Patient and family denied any questions or concerns at this time. Patient leaving unit via wheelchair and appears in no acute distress.   

## 2012-06-27 NOTE — Evaluation (Signed)
Occupational Therapy Evaluation Patient Details Name: Kaitlin [name] MRN: [id number] DOB: June 01, 1945 Today's Date: 06/27/2012 Time: 1478-2956 OT Time Calculation (min): 24 min  OT Assessment / Plan / Recommendation Clinical Impression  67 yo female admitted for HR 180 with tachycardia 12 hours of dizzinesss and AICD activated. Pt underwent elective cardioversion and does not require skilled OT acutely. OT to sign off with no follow up    OT Assessment  Patient does not need any further OT services    Follow Up Recommendations  No OT follow up    Barriers to Discharge      Equipment Recommendations  None recommended by OT    Recommendations for Other Services    Frequency       Precautions / Restrictions Precautions Precautions: None Restrictions Weight Bearing Restrictions: No   Pertinent Vitals/Pain HR 128 with ambulation RR 110 at EOB    ADL  Eating/Feeding: Independent Where Assessed - Eating/Feeding: Edge of bed Grooming: Wash/dry face;Wash/dry hands;Independent Where Assessed - Grooming: Unsupported sitting Lower Body Dressing: Independent Where Assessed - Lower Body Dressing: Unsupported sit to stand Toilet Transfer: Independent Toilet Transfer Method: Sit to Barista: Regular height toilet Equipment Used: Gait belt Transfers/Ambulation Related to ADLs: Pt ambulating Independent with incr HR to 128. Pt educated on monitoring pulse (carotid artery for 30 seconds multiply by 2 or using smart phone app) ADL Comments: Pt educated on energy conservation with ADLS. Pt able to complete don of bil bedroom shoes. Pt completed bed mobility withotu deficits    OT Diagnosis:    OT Problem List:   OT Treatment Interventions:     OT Goals    Visit Information  Last OT Received On: 06/27/12 Assistance Needed: +1 PT/OT Co-Evaluation/Treatment: Yes    Subjective Data  Subjective: "I already do that" pt with big smile- pt educated on energy  conservation Patient Stated Goal: to return home   Prior Functioning     Home Living Lives With: Alone Type of Home: House Home Access: Stairs to enter [employer] of Steps: 5 Entrance Stairs-Rails: Can reach both Home Layout: One level Bathroom Shower/Tub: Forensic scientist: Standard Bathroom Accessibility: Yes How Accessible: Accessible via walker Home Adaptive Equipment: None Prior Function Level of Independence: Independent Able to Take Stairs?: Yes Driving: Yes Vocation: Full time employment Communication Communication: No difficulties Dominant Hand: Right         Vision/Perception Vision - History Baseline Vision: Wears contacts Patient Visual Report: No change from baseline   Cognition  Cognition Overall Cognitive Status: Appears within functional limits for tasks assessed/performed Arousal/Alertness: Awake/alert Orientation Level: Appears intact for tasks assessed Behavior During Session: [facility] for tasks performed    Extremity/Trunk Assessment Right Upper Extremity Assessment RUE ROM/Strength/Tone: Within functional levels RUE Sensation: WFL - Light Touch RUE Coordination: WFL - gross/fine motor Left Upper Extremity Assessment LUE ROM/Strength/Tone: Within functional levels LUE Sensation: WFL - Light Touch LUE Coordination: WFL - gross/fine motor Right Lower Extremity Assessment RLE ROM/Strength/Tone: Within functional levels Left Lower Extremity Assessment LLE ROM/Strength/Tone: Within functional levels Trunk Assessment Trunk Assessment: Normal     Mobility Bed Mobility Bed Mobility: Supine to Sit Supine to Sit: 7: Independent;HOB flat Transfers Transfers: Sit to Stand;Stand to Sit Sit to Stand: With upper extremity assist;From bed;7: Independent Stand to Sit: 7: Independent;With upper extremity assist;To bed     Exercise     Balance Standardized Balance Assessment Standardized Balance Assessment: Dynamic  Gait Index Dynamic Gait Index Level Surface:  Normal Change in Gait Speed: Normal Gait with Horizontal Head Turns: Normal Gait with Vertical Head Turns: Normal Gait and Pivot Turn: Normal Step Over Obstacle: Normal Step Around Obstacles: Normal Steps: Mild Impairment Total Score: 23 High Level Balance High Level Balance Activites: Sudden stops   End of Session OT - End of Session Activity Tolerance: Patient tolerated treatment well Patient left: in bed;with call bell/phone within reach Nurse Communication: Mobility status;Precautions  GO     Lucile Shutters 06/27/2012, 9:56 AM Pager: [phone]

## 2012-06-27 NOTE — Evaluation (Signed)
Physical Therapy Evaluation Patient Details Name: Kaitlin [name] MRN: [id number] DOB: 01/17/1946 Today's Date: 06/27/2012 Time: 1478-2956 PT Time Calculation (min): 15 min  PT Assessment / Plan / Recommendation Clinical Impression  67 yo female admitted for HR 180 with tachycardia 12 hours of dizzinesss and AICD activated. Pt underwent elective cardioversion. Presents to PT today independent with gait and transfers and no balance deficits. HR elevated to 129 with ambulation. Pt educated on how to monitor HR during activity. Pt to check with cardiologist to determine when she can resume exercise. No further PT  needs at this time.     PT Assessment  Patent does not need any further PT services    Follow Up Recommendations  No PT follow up    Does the patient have the potential to tolerate intense rehabilitation      Barriers to Discharge        Equipment Recommendations  None recommended by PT    Recommendations for Other Services     Frequency      Precautions / Restrictions Precautions Precautions: None Restrictions Weight Bearing Restrictions: No   Pertinent Vitals/Pain Denies pain. HR 108 sitting EOB and elevated to 129 with ambulation      Mobility  Bed Mobility Bed Mobility: Supine to Sit Supine to Sit: 7: Independent;HOB flat Transfers Transfers: Sit to Stand;Stand to Sit Sit to Stand: With upper extremity assist;From bed;7: Independent Stand to Sit: 7: Independent;With upper extremity assist;To bed Ambulation/Gait Ambulation/Gait Assistance: 7: Independent Ambulation Distance (Feet): 300 Feet Assistive device: None Gait Pattern: Within Functional Limits Gait velocity: wfl Stairs: Yes Stairs Assistance: 6: Modified independent (Device/Increase time) Stair Management Technique: One rail Right Number of Stairs: 3        Visit Information  Last PT Received On: 06/27/12 Assistance Needed: +1    Subjective Data  Subjective: I told my Kaitlin [name]  that I would be fine.  Patient Stated Goal: home today   Prior Functioning  Home Living Lives With: Alone Type of Home: House Home Access: Stairs to enter Secretary/administrator of Steps: 5 Entrance Stairs-Rails: Can reach both Home Layout: One level Bathroom Shower/Tub: Forensic scientist: Standard Bathroom Accessibility: Yes How Accessible: Accessible via walker Home Adaptive Equipment: None Prior Function Level of Independence: Independent Able to Take Stairs?: Yes Driving: Yes Vocation: Full time employment Communication Communication: No difficulties Dominant Hand: Right    Cognition  Cognition Overall Cognitive Status: Appears within functional limits for tasks assessed/performed Arousal/Alertness: Awake/alert Orientation Level: Appears intact for tasks assessed Behavior During Session: [facility] for tasks performed    Extremity/Trunk Assessment Right Upper Extremity Assessment RUE ROM/Strength/Tone: Within functional levels RUE Sensation: WFL - Light Touch RUE Coordination: WFL - gross/fine motor Left Upper Extremity Assessment LUE ROM/Strength/Tone: Within functional levels LUE Sensation: WFL - Light Touch LUE Coordination: WFL - gross/fine motor Right Lower Extremity Assessment RLE ROM/Strength/Tone: Within functional levels Left Lower Extremity Assessment LLE ROM/Strength/Tone: Within functional levels Trunk Assessment Trunk Assessment: Normal   Balance Standardized Balance Assessment Standardized Balance Assessment: Dynamic Gait Index Dynamic Gait Index Level Surface: Normal Change in Gait Speed: Normal Gait with Horizontal Head Turns: Normal Gait with Vertical Head Turns: Normal Gait and Pivot Turn: Normal Step Over Obstacle: Normal Step Around Obstacles: Normal Steps: Mild Impairment Total Score: 23 High Level Balance High Level Balance Activites: Sudden stops  End of Session PT - End of Session Equipment Utilized During  Treatment: Gait belt Activity Tolerance: Patient tolerated treatment well Patient left:  (sitting  EOB) Nurse Communication: Mobility status  GP     [facility] [name] 06/27/2012, 10:02 AM

## 2012-06-28 NOTE — Progress Notes (Signed)
Utilization Review Completed.   Diani Jillson, RN, BSN Nurse Case Manager  [phone]  

## 2012-06-29 ENCOUNTER — Encounter (HOSPITAL_COMMUNITY): Payer: Self-pay | Admitting: Respiratory Therapy

## 2012-07-04 ENCOUNTER — Encounter: Payer: Commercial Managed Care - PPO | Admitting: Cardiology

## 2012-07-04 ENCOUNTER — Other Ambulatory Visit (INDEPENDENT_AMBULATORY_CARE_PROVIDER_SITE_OTHER): Payer: Commercial Managed Care - PPO

## 2012-07-04 DIAGNOSIS — I471 Supraventricular tachycardia: Secondary | ICD-10-CM

## 2012-07-04 DIAGNOSIS — I472 Ventricular tachycardia, unspecified: Secondary | ICD-10-CM

## 2012-07-04 DIAGNOSIS — I498 Other specified cardiac arrhythmias: Secondary | ICD-10-CM

## 2012-07-04 DIAGNOSIS — Z01812 Encounter for preprocedural laboratory examination: Secondary | ICD-10-CM

## 2012-07-04 LAB — CBC WITH DIFFERENTIAL/PLATELET
Basophils Absolute: 0 10*3/uL (ref 0.0–0.1)
Eosinophils Absolute: 0.1 10*3/uL (ref 0.0–0.7)
Lymphocytes Relative: 22.4 % (ref 12.0–46.0)
MCHC: 33.2 g/dL (ref 30.0–36.0)
MCV: 91.5 fl (ref 78.0–100.0)
Monocytes Absolute: 0.8 10*3/uL (ref 0.1–1.0)
Neutrophils Relative %: 69.5 % (ref 43.0–77.0)
Platelets: 469 10*3/uL — ABNORMAL HIGH (ref 150.0–400.0)
RDW: 13.4 % (ref 11.5–14.6)

## 2012-07-04 LAB — BASIC METABOLIC PANEL
BUN: 32 mg/dL — ABNORMAL HIGH (ref 6–23)
CO2: 24 mEq/L (ref 19–32)
Calcium: 10.4 mg/dL (ref 8.4–10.5)
Chloride: 97 mEq/L (ref 96–112)
Creatinine, Ser: 1.5 mg/dL — ABNORMAL HIGH (ref 0.4–1.2)
Glucose, Bld: 150 mg/dL — ABNORMAL HIGH (ref 70–99)

## 2012-07-05 ENCOUNTER — Encounter (HOSPITAL_COMMUNITY)
Admission: RE | Disposition: A | Discharge status: Home / Self Care | Payer: Self-pay | Source: Ambulatory Visit | Attending: Internal Medicine

## 2012-07-05 ENCOUNTER — Ambulatory Visit (HOSPITAL_COMMUNITY)
Admission: RE | Admit: 2012-07-05 | Discharge: 2012-07-06 | Disposition: A | Discharge status: Home / Self Care | Payer: Commercial Managed Care - PPO | Source: Ambulatory Visit | Attending: Internal Medicine | Admitting: Internal Medicine

## 2012-07-05 DIAGNOSIS — K7200 Acute and subacute hepatic failure without coma: Secondary | ICD-10-CM

## 2012-07-05 DIAGNOSIS — I498 Other specified cardiac arrhythmias: Secondary | ICD-10-CM | POA: Insufficient documentation

## 2012-07-05 DIAGNOSIS — Z86718 Personal history of other venous thrombosis and embolism: Secondary | ICD-10-CM | POA: Insufficient documentation

## 2012-07-05 DIAGNOSIS — N179 Acute kidney failure, unspecified: Secondary | ICD-10-CM

## 2012-07-05 DIAGNOSIS — I5023 Acute on chronic systolic (congestive) heart failure: Secondary | ICD-10-CM

## 2012-07-05 DIAGNOSIS — E785 Hyperlipidemia, unspecified: Secondary | ICD-10-CM | POA: Insufficient documentation

## 2012-07-05 DIAGNOSIS — J811 Chronic pulmonary edema: Secondary | ICD-10-CM

## 2012-07-05 DIAGNOSIS — R739 Hyperglycemia, unspecified: Secondary | ICD-10-CM

## 2012-07-05 DIAGNOSIS — I509 Heart failure, unspecified: Secondary | ICD-10-CM | POA: Insufficient documentation

## 2012-07-05 DIAGNOSIS — I428 Other cardiomyopathies: Secondary | ICD-10-CM | POA: Insufficient documentation

## 2012-07-05 DIAGNOSIS — I447 Left bundle-branch block, unspecified: Secondary | ICD-10-CM | POA: Insufficient documentation

## 2012-07-05 DIAGNOSIS — I959 Hypotension, unspecified: Secondary | ICD-10-CM

## 2012-07-05 DIAGNOSIS — I472 Ventricular tachycardia: Secondary | ICD-10-CM

## 2012-07-05 DIAGNOSIS — I471 Supraventricular tachycardia: Secondary | ICD-10-CM

## 2012-07-05 DIAGNOSIS — I5022 Chronic systolic (congestive) heart failure: Secondary | ICD-10-CM | POA: Insufficient documentation

## 2012-07-05 DIAGNOSIS — E876 Hypokalemia: Secondary | ICD-10-CM

## 2012-07-05 DIAGNOSIS — R570 Cardiogenic shock: Secondary | ICD-10-CM

## 2012-07-05 DIAGNOSIS — G4700 Insomnia, unspecified: Secondary | ICD-10-CM

## 2012-07-05 DIAGNOSIS — I10 Essential (primary) hypertension: Secondary | ICD-10-CM | POA: Insufficient documentation

## 2012-07-05 DIAGNOSIS — Z95810 Presence of automatic (implantable) cardiac defibrillator: Secondary | ICD-10-CM | POA: Insufficient documentation

## 2012-07-05 HISTORY — PX: SUPRAVENTRICULAR TACHYCARDIA ABLATION: SHX5492

## 2012-07-05 LAB — CBC
HCT: 37.6 % (ref 36.0–46.0)
Hemoglobin: 13 g/dL (ref 12.0–15.0)
RDW: 13 % (ref 11.5–15.5)
WBC: 11.8 10*3/uL — ABNORMAL HIGH (ref 4.0–10.5)

## 2012-07-05 LAB — CREATININE, SERUM
GFR calc Af Amer: 54 mL/min — ABNORMAL LOW (ref >90)
GFR calc non Af Amer: 46 mL/min — ABNORMAL LOW (ref >90)

## 2012-07-05 SURGERY — SUPRAVENTRICULAR TACHYCARDIA ABLATION
Anesthesia: LOCAL

## 2012-07-05 MED ORDER — SODIUM CHLORIDE 0.9 % IJ SOLN: 3.0000 mL | INTRAMUSCULAR | Status: DC | PRN

## 2012-07-05 MED ORDER — ONDANSETRON HCL 4 MG/2ML IJ SOLN: 4.0000 mg | Freq: Four times a day (QID) | INTRAMUSCULAR | Status: DC | PRN

## 2012-07-05 MED ORDER — LISINOPRIL 5 MG PO TABS
5.0000 mg | ORAL_TABLET | Freq: Two times a day (BID) | ORAL | Status: DC
  Administered 2012-07-06: 5 mg via ORAL
  Filled 2012-07-05 (×3): qty 1

## 2012-07-05 MED ORDER — ACETAMINOPHEN 325 MG PO TABS: 650.0000 mg | ORAL_TABLET | ORAL | Status: DC | PRN

## 2012-07-05 MED ORDER — SODIUM CHLORIDE 0.9 % IJ SOLN
3.0000 mL | Freq: Two times a day (BID) | INTRAMUSCULAR | Status: DC
  Administered 2012-07-05: 3 mL via INTRAVENOUS

## 2012-07-05 MED ORDER — FUROSEMIDE 40 MG PO TABS
40.0000 mg | ORAL_TABLET | Freq: Every day | ORAL | Status: DC
  Administered 2012-07-06: 40 mg via ORAL
  Filled 2012-07-05: qty 1

## 2012-07-05 MED ORDER — MIDAZOLAM HCL 5 MG/5ML IJ SOLN
INTRAMUSCULAR | Status: AC
  Filled 2012-07-05: qty 5

## 2012-07-05 MED ORDER — DIGOXIN 125 MCG PO TABS
0.1250 mg | ORAL_TABLET | Freq: Every day | ORAL | Status: DC
Start: 2012-07-05 — End: 2012-07-06
  Administered 2012-07-06: 0.125 mg via ORAL
  Filled 2012-07-05: qty 1

## 2012-07-05 MED ORDER — SODIUM CHLORIDE 0.9 % IV SOLN
250.0000 mL | INTRAVENOUS | Status: DC | PRN
Start: 2012-07-05 — End: 2012-07-06

## 2012-07-05 MED ORDER — FENTANYL CITRATE 0.05 MG/ML IJ SOLN
INTRAMUSCULAR | Status: AC
  Filled 2012-07-05: qty 2

## 2012-07-05 MED ORDER — SIMVASTATIN 40 MG PO TABS
40.0000 mg | ORAL_TABLET | Freq: Every day | ORAL | Status: DC
  Administered 2012-07-05: 40 mg via ORAL
  Filled 2012-07-05 (×2): qty 1

## 2012-07-05 MED ORDER — HEPARIN SODIUM (PORCINE) 5000 UNIT/ML IJ SOLN
5000.0000 [IU] | Freq: Three times a day (TID) | INTRAMUSCULAR | Status: DC
  Administered 2012-07-05 – 2012-07-06 (×2): 5000 [IU] via SUBCUTANEOUS
  Filled 2012-07-05 (×6): qty 1

## 2012-07-05 MED ORDER — BUPIVACAINE HCL (PF) 0.25 % IJ SOLN
INTRAMUSCULAR | Status: AC
  Filled 2012-07-05: qty 60

## 2012-07-05 MED ORDER — HYDROXYUREA 500 MG PO CAPS
ORAL_CAPSULE | ORAL | Status: AC
  Filled 2012-07-05: qty 1

## 2012-07-05 MED ORDER — SPIRONOLACTONE 25 MG PO TABS
25.0000 mg | ORAL_TABLET | Freq: Every day | ORAL | Status: DC
  Administered 2012-07-06: 25 mg via ORAL
  Filled 2012-07-05: qty 1

## 2012-07-05 MED ORDER — HEPARIN (PORCINE) IN NACL 2-0.9 UNIT/ML-% IJ SOLN
INTRAMUSCULAR | Status: AC
  Filled 2012-07-05: qty 500

## 2012-07-05 MED ORDER — CARVEDILOL 3.125 MG PO TABS
3.1250 mg | ORAL_TABLET | Freq: Two times a day (BID) | ORAL | Status: DC
  Administered 2012-07-06: 3.125 mg via ORAL
  Filled 2012-07-05 (×4): qty 1

## 2012-07-05 NOTE — H&P (Signed)
Kaitlin [name] is an 67 y.o. female.   Chief Complaint: "I am here to have an ablation HPI: The patient is a 67 yo woman with a non-ischemic CM, s/p ICD implant who was admitted several weeks ago with incessant wide QRS tachycardia that was below her detection zone on her ICD. She developed refractory CHF and required ionotropes but eventually improved. Because of the severity of her SVT and because she had experienced these episodes before despite medical therapy with beta blockers, she presents for ablation. The mechanism and the location of her wide QRS tachycardia is uncertain. Previously her Ef has been 30% but in the setting of prolonged tachycardia was 15%. The patient had tolerated her tachycardia for many hours when she presented before. Her clinical tachy is a left bundle branch block tachy with negative precordial concordance and a left superior axis at 190/min. She was treated with DCCV.   Past Medical History  Diagnosis Date  . Paroxysmal supraventricular tachycardia   . Hypokalemia   . Chronic systolic heart failure     a. 05/6107 Echo: EF 15%, mod to sev antlat and inflat HK, inf AK, mild to mod MR, mildly/mod reduced RV fxn, Mod TR, PASP .  [name] History of DVT (deep vein thrombosis)   . Dyslipidemia   . HTN (hypertension)   . Nonischemic cardiomyopathy     a. 06/2012 Echo: EF 15%;  b. 06/2012 Cath: nl except luminal irregs in LAD.  [name] Ventricular tachycardia     a. s/p ICD  . Goiter   . Implantable cardiac defibrillator- Biotronik     Single chamber  . [name] (left bundle branch block)     intermittent    Past Surgical History  Procedure Laterality Date  . None    . Cardiac defibrillator placement      Family History  Problem Relation Age of Onset  . Emphysema Mother   . Heart disease Mother   . Heart attack Father   . Emphysema Brother    Social History:  reports that she has never smoked. She does not have any smokeless tobacco history on file. She reports  that she does not drink alcohol or use illicit drugs.  Allergies: No Known Allergies  Medications Prior to Admission  Medication Sig Dispense Refill  . acetaminophen (TYLENOL) 325 MG tablet Take 650 mg by mouth every 6 (six) hours as needed for pain.      [name] aspirin EC 81 MG tablet Take 81 mg by mouth daily.      . carvedilol (COREG) 3.125 MG tablet Take 1 tablet (3.125 mg total) by mouth 2 (two) times daily with a meal.  60 tablet  11  . Cyanocobalamin (B-12 PO) Take 1 tablet by mouth daily.      . digoxin (LANOXIN) 0.125 MG tablet Take 1 tablet (0.125 mg total) by mouth daily.  30 tablet  6  . furosemide (LASIX) 40 MG tablet Take 40 mg by mouth daily.      [name] lisinopril (PRINIVIL,ZESTRIL) 5 MG tablet Take 1 tablet (5 mg total) by mouth 2 (two) times daily.  60 tablet  6  . Multiple Vitamin (MULTIVITAMIN WITH MINERALS) TABS Take 1 tablet by mouth daily.      [name] omega-3 acid ethyl esters (LOVAZA) 1 G capsule Take 2 g by mouth 2 (two) times daily.      . simvastatin (ZOCOR) 40 MG tablet Take 40 mg by mouth at bedtime.      [name] spironolactone (  ALDACTONE) 25 MG tablet Take 1 tablet (25 mg total) by mouth daily.  30 tablet  6    Results for orders placed in visit on 07/04/12 (from the past 48 hour(s))  BASIC METABOLIC PANEL     Status: Abnormal   Collection Time    07/04/12 12:21 PM      Result Value Range   Sodium 132 (*) 135 - 145 mEq/L   Potassium 4.7  3.5 - 5.1 mEq/L   Chloride 97  96 - 112 mEq/L   CO2 24  19 - 32 mEq/L   Glucose, Bld 150 (*) 70 - 99 mg/dL   BUN 32 (*) 6 - 23 mg/dL   Creatinine, Ser 1.5 (*) 0.4 - 1.2 mg/dL   Calcium 16.1  8.4 - 09.6 mg/dL   GFR 04.54 (*) >09.81 mL/min  CBC WITH DIFFERENTIAL     Status: Abnormal   Collection Time    07/04/12 12:21 PM      Result Value Range   WBC 11.4 (*) 4.5 - 10.5 K/uL   RBC 4.55  3.87 - 5.11 Mil/uL   Hemoglobin 13.9  12.0 - 15.0 g/dL   HCT 19.1  47.8 - 29.5 %   MCV 91.5  78.0 - 100.0 fl   MCHC 33.2  30.0 - 36.0 g/dL   RDW 62.1   30.8 - 65.7 %   Platelets 469.0 (*) 150.0 - 400.0 K/uL   Neutrophils Relative 69.5  43.0 - 77.0 %   Lymphocytes Relative 22.4  12.0 - 46.0 %   Monocytes Relative 6.9  3.0 - 12.0 %   Eosinophils Relative 0.9  0.0 - 5.0 %   Basophils Relative 0.3  0.0 - 3.0 %   Neutro Abs 7.9 (*) 1.4 - 7.7 K/uL   Lymphs Abs 2.6  0.7 - 4.0 K/uL   Monocytes Absolute 0.8  0.1 - 1.0 K/uL   Eosinophils Absolute 0.1  0.0 - 0.7 K/uL   Basophils Absolute 0.0  0.0 - 0.1 K/uL   No results found.  ROS All systems reviewed and unchanged since her prior visit.  Blood pressure 108/69, pulse 88, temperature 98.4 F (36.9 C), temperature source Oral, resp. rate 18, height 5\' 3"  (1.6 m), weight 171 lb (77.565 kg), SpO2 99.00%.  Well appearing middle aged woman, NAD HEENT: Unremarkable Neck:  7 cm JVD, no thyromegally Lungs:  Clear with no wheezes, rales, or rhonchi HEART:  Regular rate rhythm, no murmurs, no rubs, no clicks Abd:  Flat, positive bowel sounds, no organomegally, no rebound, no guarding Ext:  2 plus pulses, no edema, no cyanosis, no clubbing Skin:  No rashes no nodules Neuro:  CN II through XII intact, motor grossly intact   Assessment/Plan 1. Symptomatic wide QRS tachycardia 2. Non-ischemic CM 3. HTN 4. Chronic systolic CHF Rec: I have discussed the treatment options with the patient and the risk/benfits/goals/expectations of catheter ablation of her wide QRS tachycardia have been discussed as well as limitations of the procedure and she wishes to proceed.  Gregg Taylor,M.D. 07/05/2012, 7:40 AM

## 2012-07-05 NOTE — Op Note (Signed)
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 [id number].

## 2012-07-05 NOTE — Progress Notes (Signed)
Pt found lying on her right side asleep during 1245 groin check. Groin has minimal bleeding. Pressure held for 15 minutes. New pressure dressing applied. VSS. Will continue to monitor closely. Levonne Spiller, RN

## 2012-07-06 DIAGNOSIS — I471 Supraventricular tachycardia, unspecified: Secondary | ICD-10-CM

## 2012-07-06 NOTE — Progress Notes (Signed)
   ELECTROPHYSIOLOGY ROUNDING NOTE    Patient Name: Kaitlin [name] Date of Encounter: 07-06-2012    SUBJECTIVE:Patient feels well.  No chest pain or shortness of breath. S/p EPS/RFCA 07-05-2012 with slow pathway modification.  Also with inducible VT that was not ablated. Pt with recurrent tachycardia this morning with ambulation- asymptomatic. This was sinus tachy.  TELEMETRY: Reviewed telemetry pt in sinus rhythm/intermittent periods of probable sinus tach vs atrial tachy- rates 140 Filed Vitals:   07/05/12 1500 07/05/12 1530 07/05/12 2100 07/06/12 0610  BP: 90/48 90/62 94/62  107/76  Pulse: 84 83 82 93  Temp:   98.7 F (37.1 C) 97.9 F (36.6 C)  TempSrc:   Oral Oral  Resp:   16 20  Height:      Weight:      SpO2:   100% 97%    Intake/Output Summary (Last 24 hours) at 07/06/12 0634 Last data filed at 07/05/12 2130  Gross per 24 hour  Intake    363 ml  Output      0 ml  Net    363 ml   Physical exam  Well appearing NAD HEENT: Unremarkable Neck:  7 cm JVD, no thyromegally Lymphatics:  No adenopathy Back:  No CVA tenderness Lungs:  Clear with no wheezes, rales, or rhonchi HEART:  Regular rate rhythm, no murmurs, no rubs, no clicks Abd:  Flat, positive bowel sounds, no organomegally, no rebound, no guarding Ext:  2 plus pulses, no edema, no cyanosis, no clubbing Skin:  No rashes no nodules Neuro:  CN II through XII intact, motor grossly intact  LABS: Basic Metabolic Panel:  Recent Labs  21/30/86 1221 07/05/12 1340  NA 132*  --   K 4.7  --   CL 97  --   CO2 24  --   GLUCOSE 150*  --   BUN 32*  --   CREATININE 1.5* 1.21*  CALCIUM 10.4  --    CBC:  Recent Labs  07/04/12 1221 07/05/12 1340  WBC 11.4* 11.8*  NEUTROABS 7.9*  --   HGB 13.9 13.0  HCT 41.7 37.6  MCV 91.5 89.5  PLT 469.0* 339    Radiology/Studies:  Dg Chest Port 1 View 06/19/2012  *RADIOLOGY REPORT*  Clinical Data: Pulmonary edema, shortness of breath.  PORTABLE CHEST - 1 VIEW   Comparison: Earlier film of the same day  Findings: Right IJ central line and left subclavian AICD are stable in position.  External pacing lead remains in place.  Stable moderate cardiomegaly.  Low volumes with mild interstitial edema and central pulmonary vascular congestion, increased since the prior study.  There may be small pleural effusions.  IMPRESSION: 1. Cardiomegaly with increase in mild interstitial edema and vascular congestion. 2. Support hardware stable in position.   Original Report Authenticated By: D. Andria Rhein, MD    PHYSICAL EXAM Right neck and groin incisions without complication.  Wound care, restrictions reviewed with patient. Routine follow up scheduled.   A/P  1. Wide QRS tachy 2. DCM 3. Chronic CHF, systolic 4. S/p ablation AVNRT Rec: ok to discharge this morning. She will not need to see Dr. Shirlee Latch tomorrow.  Usual followup with me. She has seen Dr. [facility] previously.  Leonia Reeves.D.

## 2012-07-06 NOTE — Op Note (Signed)
NAMEVICKE, PLOTNER NO.:  [id number]  MEDICAL RECORD NO.:  [id number]  LOCATION:  3W09C                        FACILITY:  [facility]  PHYSICIAN:  Doylene Canning. Ladona Ridgel, MD    DATE OF BIRTH:  01-27-1946  DATE OF PROCEDURE:  07/05/2012 DATE OF DISCHARGE:                              OPERATIVE REPORT   PROCEDURE PERFORMED:  Electrophysiologic study and RF catheter ablation of nonsustained SVT in a patient with history of sustained wide QRS tachycardia.  INTRODUCTION:  The patient is a 67 year old woman with a nonischemic cardiomyopathy and chronic systolic heart failure, and left bundle- branch block.  The patient was admitted to the hospital several weeks ago with sustained wide QRS tachycardia.  She has a single-chamber ICD and the mechanism of the tachycardia could not be discerned.  She now returns for electrophysiologic study and catheter ablation.  DESCRIPTION OF THE PROCEDURE:  After informed consent was obtained, the patient was taken to the diagnostic EP lab in a fasting state.  After usual preparation and draping, intravenous fentanyl and midazolam was given for sedation.  A 6-French hexapolar catheter was inserted percutaneously into the right jugular vein and advanced to coronary sinus.  A 6-French quadripolar catheter was inserted percutaneously in the right femoral vein and advanced to the right ventricle.  A 6-French quadripolar catheter was inserted percutaneously in the right femoral vein and advanced to His bundle region.  A 6-French quadripolar catheter was inserted percutaneously in the right femoral vein and advanced to right atrium.  Rapid ventricular pacing was carried out from the right ventricle demonstrating a VA Wenckebach cycle length of 440 milliseconds.  Programmed ventricular stimulation was carried out from the right ventricle at a base drive cycle length of 161 milliseconds. The S1-S2 interval was stepwise decreased down to 380  milliseconds where the retrograde AV node ERP was observed.  During programmed ventricular stimulation, the atrial activation sequence was midline and decremental. Next, rapid atrial pacing was carried out from the atrium at a base drive cycle length of 096 milliseconds and stepwise decreased down to 430 milliseconds.  During rapid atrial pacing, the PR interval was equal to the RR interval.  Next, programmed atrial stimulation was carried out from the atrium at a base drive cycle length of 045 milliseconds.  The S1-S2 interval stepwise decreased down to 210 milliseconds with AV node ERP was observed.  During programmed atrial stimulation, there were multiple H jumps and echo beats noted.  At this point, additional programmed ventricular stimulation was carried out from the right ventricle with S1-S2, and S1, S2, or S3 stimuli delivered.  An S1-S2-S3 coupling interval of 500/240/270, there was inducible ventricular tachycardia at the cycle length of 270 milliseconds.  This was hemodynamically unstable.  She was initially polymorphic, then became monomorphic.  The patient was defibrillated.  She was transiently hypotensive, but this resolved.  Review of the morphology of the patient's tachycardia demonstrated that it was similar to the morphology of the patient's wide QRS tachycardia, in that there was negative precordial concordance, and it was a left superior axis.  However, the morphology was significantly different.  The QRS duration was deferred. The cycle length was faster.  At this point, 5  minutes was elapsed to allow the patient to recover.  Isoproterenol was then infused at rates of 1-2 mcg per minute.  Rapid atrial pacing was then carried out demonstrating nonsustained SVT.  The PR interval was greater than the RR interval.  The patient would have 2 or 3 beats of SVT and stopped spontaneously.  It should be noted that with rapid atrial pacing from the atrium, the QRS morphology  was identical to the QRS morphology in the patient's tachycardia previously demonstrated clinically.  With a PR interval greater than the RR interval, multiple jumps and echos, nonsustained SVT, and an identical QRS compared to the patient's clinical tachycardia, a diagnosis of AV node reentrant tachycardia was made, and a quadripolar ablation catheter was then advanced through the right femoral vein and up into the right atrium.  Mapping was carried out along the right atrium and right ventricle using 3-D electro anatomic mapping.  It should be noted that was spontaneous manipulation of the ablation catheter, a 3rd tachycardia was induced.  This tachycardia was clearly ventricular tachycardia demonstrating VA dissociation.  However, it was notable that the QRS morphology was also very similar to the patient's VT whether the patient's clinical tachycardia and had the same cycle length.  It was slightly different in multiple leads however.  The patient's blood pressure was marginal in this tachycardia and for this reason, antitachycardic pacing was carried out resulting in pace termination of the patient's tachycardia.  At this point, we had a decision to make.  One option would be to proceed with slow pathway modification alone.  A 2nd option would be to proceed with right bundle-branch ablation as the patient's clinical VT appeared to be though was not definitively confirmed to be bundle branch reentrant VT. It was deemed most appropriate to ablate the slow pathway.  This was successfully carried out with four RF energy applications.  At this point, additional catheter manipulation was carried out in the right ventricle in hopes of re-inducing the patient's second clinical arrhythmias.  This could not be done.  Because of right bundle-branch ablation rather would resulted in necessity of upgrading the patient's single-chamber pacemaker to a biventricular device.  It was deemed  most appropriate to allow the patient to clinically declare herself before considering another ablation or upgrade of a new device.  The catheter was then removed, the hemostasis was assured, and the patient was returned to her room in satisfactory condition.  COMPLICATIONS:  There were no immediate procedure complications.  RESULTS:  A.  Baseline ECG.  Baseline ECG demonstrates sinus rhythm with left bundle-branch block, and a QRS duration of 130 milliseconds. B.  Baseline intervals.  Sinus node cycle length was 840 milliseconds, QRS duration 130 milliseconds, the HV interval was 94 milliseconds.  The AH interval was 93 milliseconds. C.  Rapid ventricular pacing.  Rapid ventricular pacing demonstrated VA Wenckebach at 440 milliseconds. D.  Programmed ventricular stimulation.  Programmed ventricular stimulation was carried from the right ventricle demonstrated a retrograde AV node ERP of 600/380.  During programmed ventricular stimulation, the atrial activation sequence was midline and decremental. Additional programmed ventricular stimulation was carried out utilizing S1, S2, S3 stimuli resulted in the initiation of ventricular tachycardia.  The cycle length of 500/240/270.  This tachycardia was similar to the patient's clinical VT, but had differences in rate as well as morphology. E.  Rapid atrial pacing.  Rapid atrial pacing was carried out in the atrium at pacing cycle length of 600 milliseconds and stepwise  decreased down to 430 milliseconds where AV Wenckebach was observed.  On isoproterenol additional, rapid atrial pacing was carried out down to 220 milliseconds.  During rapid atrial pacing, the PR interval on isoproterenol was much greater than the RR interval, and there was nonsustained SVT induced. F.  Programmed atrial stimulation.  Programmed atrial stimulation was carried out from the atrium base drive cycle length of 469 milliseconds as well as 600 milliseconds.  This  was 2 interval stepwise decreased down to 460 milliseconds with AV node ERP was observed.  On isoproterenol, programed atrial stimulation was carried out down to 210 milliseconds.  During programmed atrial stimulation, there were multiple H jumps and echo beats and nonsustained SVT. G.  Arrhythmias observed. 1. Ventricular tachycardia, initiation was with programmed ventricular     stimulation, the duration was sustained.  Termination was with     defibrillation as the patient was hemodynamically unstable. 2. Ventricular tachycardia, initiation was with catheter manipulation,     the duration was sustained, and termination was with rapid     ventricular pacing. 3. AVNRT initiation was with rapid atrial pacing and programed to     atrial stimulation on isoproterenol, the duration was nonsustained,     termination was spontaneous.     a.     Mapping.  Mapping of the slow pathway region demonstrated a      very large Koch's triangle.     b.     RF energy application.  A total of 4 RF energy applications      were delivered to the slow pathway region resulted in a prolonged      accelerated junctional rhythm.  Following RF energy application,      the PR interval was less than the RR interval, and there was no      evidence of residual slow pathway conduction.  CONCLUSIONS:  This study demonstrates inducible VT as well as SVT. There was successful slow pathway modification.  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  It was deemed most appropriate not to attempt to ablate the right bundle because of the subsequently need for insertion of a biventricular device secondary to complete heart block.     Doylene Canning. Ladona Ridgel, MD     GWT/MEDQ  D:  07/05/2012  T:  07/06/2012  Job:  [id number]

## 2012-07-06 NOTE — Discharge Summary (Signed)
ELECTROPHYSIOLOGY PROCEDURE DISCHARGE SUMMARY    Patient ID: VERTA [name],  MRN: [id number], DOB/AGE: 06/23/1945 67 y.o.  Admit date: 07/05/2012 Discharge date: 07/06/2012  Primary Care Physician: Mirna Mires, MD Primary Cardiologist: Rollene Rotunda, MD  Primary Discharge Diagnosis:  Wide complex tachycardia status post ablation this admission  Secondary Discharge Diagnosis:  1.  Non ischemic cardiomyopathy status post ICD implant 2.  Hypertension 3.  Dyslipidemia 4.  History of DVT  Procedures This Admission:  1.  Electrophysiology study and radiofrequency catheter ablation on 07-05-2012 by Dr Ladona Ridgel.  This study demonstrated inducible VT as well as SVT. There was successful slow pathway modification. 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 It was deemed most appropriate not to attempt to ablate the right bundle because of the subsequently need for insertion of a biventricular device secondary to complete heart block.  Brief HPI:Mrs. Kaitlin [name] is a 67 yo woman with a non-ischemic CM, s/p ICD implant who was admitted several weeks ago with incessant wide QRS tachycardia that was below her detection zone on her ICD. She developed refractory CHF and required ionotropes but eventually improved. Because of the severity of her SVT and because she had experienced these episodes before despite medical therapy with beta blockers, she presents for ablation. The mechanism and the location of her wide QRS tachycardia is uncertain. Previously her Ef has been 30% but in the setting of prolonged tachycardia was 15%. The patient had tolerated her tachycardia for many hours when she presented before. Her clinical tachy is a left bundle branch block tachy with negative precordial concordance and a left superior axis at 190/min. She was treated with DCCV. Risks, benefits, and alternatives to ablation were reviewed with the patient who wished to  proceed.  Hospital Course:  The patient was admitted and underwent EPS/RFCA on 07-05-2012 with details as outlined above.  She was monitored on telemetry overnight which demonstrated sinus rhythm with some sinus tach vs atrial tach.  Her groin and neck incisions were without complication.  Dr Ladona Ridgel examined the patient and considered her stable for discharge to home.   Discharge Vitals: Blood pressure 98/60, pulse 93, temperature 97.9 F (36.6 C), temperature source Oral, resp. rate 20, height 5\' 3"  (1.6 m), weight 171 lb (77.565 kg), SpO2 97.00%.   Labs:   Lab Results  Component Value Date   WBC 11.8* 07/05/2012   HGB 13.0 07/05/2012   HCT 37.6 07/05/2012   MCV 89.5 07/05/2012   PLT 339 07/05/2012     Recent Labs Lab 07/04/12 1221 07/05/12 1340  NA 132*  --   K 4.7  --   CL 97  --   CO2 24  --   BUN 32*  --   CREATININE 1.5* 1.21*  CALCIUM 10.4  --   GLUCOSE 150*  --     Discharge Medications:    Medication List    TAKE these medications       acetaminophen 325 MG tablet  Commonly known as:  TYLENOL  Take 650 mg by mouth every 6 (six) hours as needed for pain.     aspirin EC 81 MG tablet  Take 81 mg by mouth daily.     B-12 PO  Take 1 tablet by mouth daily.     carvedilol 3.125 MG tablet  Commonly known as:  COREG  Take 1 tablet (3.125 mg total) by mouth 2 (two) times daily with a meal.  digoxin 0.125 MG tablet  Commonly known as:  LANOXIN  Take 1 tablet (0.125 mg total) by mouth daily.     furosemide 40 MG tablet  Commonly known as:  LASIX  Take 40 mg by mouth daily.     lisinopril 5 MG tablet  Commonly known as:  PRINIVIL,ZESTRIL  Take 1 tablet (5 mg total) by mouth 2 (two) times daily.     multivitamin with minerals Tabs  Take 1 tablet by mouth daily.     omega-3 acid ethyl esters 1 G capsule  Commonly known as:  LOVAZA  Take 2 g by mouth 2 (two) times daily.     simvastatin 40 MG tablet  Commonly known as:  ZOCOR  Take 40 mg by mouth at bedtime.       spironolactone 25 MG tablet  Commonly known as:  ALDACTONE  Take 1 tablet (25 mg total) by mouth daily.        Disposition:  Discharge Orders   Future Appointments Provider Department Dept Phone   09/22/2012 10:30 AM Marinus Maw, MD [facility] Main Office [place]) [phone]   Future Orders Complete By Expires     Diet - low sodium heart healthy  As directed     Discharge instructions  As directed     Comments:      No driving for 3 days. No lifting over 5 lbs for 1 week. No sexual activity for 1 week. Keep procedure site clean & dry. If you notice increased pain, swelling, bleeding or pus, call/return!  You may shower, but no soaking baths/hot tubs/pools for 1 week.    Increase activity slowly  As directed       Follow-up Information   Follow up with Lewayne Bunting, MD On 09/22/2012. (At 10:30 AM)    Contact information:   [address] [place] [address] [phone]      Duration of Discharge Encounter: Greater than 30 minutes including physician time.  Signed, Gypsy Balsam, RN, BSN 07/06/2012, 9:35 AM   Cardiology Attending  Agree with above. Leonia Reeves.D.

## 2012-07-06 NOTE — Progress Notes (Signed)
Pt HR 140's with wide QRS complex. MD called and notified. Pt up at the bathroom counter. Pt denies any chest palpitations or pain. Pt got back in bed and HR decreased 80's-90's. Will continue to monitor.

## 2012-07-07 ENCOUNTER — Ambulatory Visit: Payer: Commercial Managed Care - PPO | Admitting: Cardiology

## 2012-07-11 ENCOUNTER — Encounter: Payer: Commercial Managed Care - PPO | Admitting: Internal Medicine

## 2012-07-11 ENCOUNTER — Telehealth: Payer: Self-pay

## 2012-07-11 NOTE — Telephone Encounter (Signed)
I called pt to find out how she is doing post hospital.  She states she had a rough day yesterday but is better today.  She states she has all her meds.  BP=107/86 with a P=91.  She is aware of her f/u appt with Cardiology.

## 2012-08-27 ENCOUNTER — Emergency Department (HOSPITAL_COMMUNITY): Payer: Commercial Managed Care - PPO

## 2012-08-27 ENCOUNTER — Encounter (HOSPITAL_COMMUNITY): Payer: Self-pay | Admitting: Emergency Medicine

## 2012-08-27 ENCOUNTER — Emergency Department (HOSPITAL_COMMUNITY)
Admission: EM | Admit: 2012-08-27 | Discharge: 2012-08-27 | Disposition: A | Discharge status: Home / Self Care | Payer: Commercial Managed Care - PPO | Attending: Emergency Medicine | Admitting: Emergency Medicine

## 2012-08-27 DIAGNOSIS — Z86718 Personal history of other venous thrombosis and embolism: Secondary | ICD-10-CM | POA: Insufficient documentation

## 2012-08-27 DIAGNOSIS — E049 Nontoxic goiter, unspecified: Secondary | ICD-10-CM | POA: Insufficient documentation

## 2012-08-27 DIAGNOSIS — I472 Ventricular tachycardia: Secondary | ICD-10-CM

## 2012-08-27 DIAGNOSIS — Z7982 Long term (current) use of aspirin: Secondary | ICD-10-CM | POA: Insufficient documentation

## 2012-08-27 DIAGNOSIS — Z8679 Personal history of other diseases of the circulatory system: Secondary | ICD-10-CM | POA: Insufficient documentation

## 2012-08-27 DIAGNOSIS — I10 Essential (primary) hypertension: Secondary | ICD-10-CM | POA: Insufficient documentation

## 2012-08-27 DIAGNOSIS — Z95810 Presence of automatic (implantable) cardiac defibrillator: Secondary | ICD-10-CM | POA: Insufficient documentation

## 2012-08-27 DIAGNOSIS — Z79899 Other long term (current) drug therapy: Secondary | ICD-10-CM | POA: Insufficient documentation

## 2012-08-27 DIAGNOSIS — I499 Cardiac arrhythmia, unspecified: Secondary | ICD-10-CM | POA: Insufficient documentation

## 2012-08-27 DIAGNOSIS — I5022 Chronic systolic (congestive) heart failure: Secondary | ICD-10-CM | POA: Insufficient documentation

## 2012-08-27 DIAGNOSIS — E785 Hyperlipidemia, unspecified: Secondary | ICD-10-CM | POA: Insufficient documentation

## 2012-08-27 DIAGNOSIS — Z862 Personal history of diseases of the blood and blood-forming organs and certain disorders involving the immune mechanism: Secondary | ICD-10-CM | POA: Insufficient documentation

## 2012-08-27 DIAGNOSIS — R000 Tachycardia, unspecified: Secondary | ICD-10-CM | POA: Insufficient documentation

## 2012-08-27 DIAGNOSIS — I428 Other cardiomyopathies: Secondary | ICD-10-CM

## 2012-08-27 LAB — URINALYSIS, ROUTINE W REFLEX MICROSCOPIC
Nitrite: NEGATIVE
Specific Gravity, Urine: 1.008 (ref 1.005–1.030)
pH: 6.5 (ref 5.0–8.0)

## 2012-08-27 LAB — URINE MICROSCOPIC-ADD ON

## 2012-08-27 LAB — POCT I-STAT, CHEM 8
Calcium, Ion: 1.26 mmol/L (ref 1.13–1.30)
Chloride: 106 mEq/L (ref 96–112)
HCT: 39 % (ref 36.0–46.0)
Potassium: 4.1 mEq/L (ref 3.5–5.1)

## 2012-08-27 LAB — CBC
Platelets: 241 10*3/uL (ref 150–400)
RBC: 4.27 MIL/uL (ref 3.87–5.11)
WBC: 10.3 10*3/uL (ref 4.0–10.5)

## 2012-08-27 LAB — POCT I-STAT TROPONIN I: Troponin i, poc: 0.06 ng/mL (ref 0.00–0.08)

## 2012-08-27 MED ORDER — CARVEDILOL 25 MG PO TABS: 25.0000 mg | ORAL_TABLET | Freq: Two times a day (BID) | ORAL | Status: DC

## 2012-08-27 MED ORDER — CARVEDILOL 12.5 MG PO TABS: 12.5000 mg | ORAL_TABLET | Freq: Two times a day (BID) | ORAL | Status: DC

## 2012-08-27 MED ORDER — ONDANSETRON 4 MG PO TBDP
8.0000 mg | ORAL_TABLET | Freq: Once | ORAL | Status: AC
  Administered 2012-08-27: 8 mg via ORAL
  Filled 2012-08-27: qty 2

## 2012-08-27 MED ORDER — CARVEDILOL 3.125 MG PO TABS: 3.1250 mg | ORAL_TABLET | Freq: Two times a day (BID) | ORAL | Status: DC

## 2012-08-27 NOTE — ED Notes (Signed)
Pt. Stated, i was having my heart racing this morning and I felt dizzy and I just feel bad.

## 2012-08-27 NOTE — ED Notes (Signed)
Pt ambulated to restroom with steady gait.

## 2012-08-27 NOTE — Consult Note (Signed)
ELECTROPHYSIOLOGY CONSULT NOTE     Primary Care Physician: Kaitlin Courier, MD Referring Physician:  Dr Kaitlin [name]  Admit Date: 08/27/2012  Reason for consultation:  tachycardia  Kaitlin [name] is a 67 y.o. female with a h/o a nonischemic CM and VT (s/p EPS 3/14) who presents with sustained symptomatic arrhythmias.  She is well known to Dr Ladona Ridgel and underwent EPS 3/14 during which she had slow pathway ablation and inducible VT.  Her VT was not ablated.  She has been treated with coreg and has done reasonably well with short runs of palpitations.  Earlier today, however, she had sustained palpitations with associated nausea and weakness.  She presented to [facility] where interrogated of her ICD reveals sustained VT which was classified as SVT and for which therapy was not delivered.  She remained in tachycardia for 3 hours before it terminated.  She has also been observed to have multiple episodes of VT which have been ATP terminated. Presently, she is resting comfortably and without complaint.  She states that she wants to go home.  Today, she denies symptoms of chest pain, shortness of breath, orthopnea, PND, lower extremity edema, dizziness, presyncope, syncope, or neurologic sequela. The patient is tolerating medications without difficulties and is otherwise without complaint today.   Past Medical History  Diagnosis Date  . Paroxysmal supraventricular tachycardia   . Hypokalemia   . Chronic systolic heart failure     a. 05/6107 Echo: EF 15%, mod to sev antlat and inflat HK, inf AK, mild to mod MR, mildly/mod reduced RV fxn, Mod TR, PASP .  [name] History of DVT (deep vein thrombosis)   . Dyslipidemia   . HTN (hypertension)   . Nonischemic cardiomyopathy     a. 06/2012 Echo: EF 15%;  b. 06/2012 Cath: nl except luminal irregs in LAD.  [name] Ventricular tachycardia     a. s/p ICD  . Goiter   . Implantable cardiac defibrillator- Biotronik     Single chamber  . [name] (left bundle branch  block)     intermittent   Past Surgical History  Procedure Laterality Date  . None    . Cardiac defibrillator placement         No Known Allergies  History   Social History  . Marital Status: Single    Spouse Name: N/A    Number of Children: N/A  . Years of Education: N/A   Occupational History  . 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 Smoking status: Never Smoker   . Smokeless tobacco: Not on file  . Alcohol Use: No  . Drug Use: No  . Sexually Active: Not on file   Other Topics Concern  . Not on file   Social History Narrative  . No narrative on file    Family History  Problem Relation Age of Onset  . Emphysema Mother   . Heart disease Mother   . Heart attack Father   . Emphysema Brother     ROS- All systems are reviewed and negative except as per the HPI above  Physical Exam: Telemetry: Filed Vitals:   08/27/12 1430 08/27/12 1445 08/27/12 1500 08/27/12 1515  BP: 95/60 107/40 94/55   Pulse: 39 69 85 45  Temp:  98.6 F (37 C)    TempSrc:  Oral    Resp: 16 15 15 17   Weight:      SpO2: 96% 98% 96% 98%    GEN- The patient is well  appearing, alert and oriented x 3 today.   Head- normocephalic, atraumatic Eyes-  Sclera clear, conjunctiva pink Ears- hearing intact Oropharynx- clear Neck- supple, + JVD Lymph- no cervical lymphadenopathy Lungs- Clear to ausculation bilaterally, normal work of breathing Heart- Regular rate and rhythm with frequent ectopy  GI- soft, NT, ND, + BS Extremities- no clubbing, cyanosis, trace edema MS- no significant deformity or atrophy Skin- no rash or lesion Psych- euthymic mood, full affect Neuro- strength and sensation are intact  EKG reviewed  Labs:   Lab Results  Component Value Date   WBC 10.3 08/27/2012   HGB 13.3 08/27/2012   HCT 39.0 08/27/2012   MCV 89.2 08/27/2012   PLT 241 08/27/2012    Recent Labs Lab 08/27/12 1253  NA 137  K 4.1  CL 106  BUN 22  CREATININE 1.30*   GLUCOSE 168*   Lab Results  Component Value Date   CKTOTAL 111 02/22/2009   CKMB 5.2* 02/22/2009   TROPONINI >20.00* 06/19/2012    Lab Results  Component Value Date   CHOL 153 04/14/2010   CHOL  Value: 279        ATP III CLASSIFICATION:  <200     mg/dL   Desirable  161-096  mg/dL   Borderline High  >=045    mg/dL   High       * 08/09/8117   Lab Results  Component Value Date   HDL 44.60 04/14/2010   HDL 46 05/14/2007   Lab Results  Component Value Date   LDLCALC 92 04/14/2010   LDLCALC  Value: 203        Total Cholesterol/HDL:CHD Risk Coronary Heart Disease Risk Table                     Men   Women  1/2 Average Risk   3.4   3.3  Average Risk       5.0   4.4  2 X Average Risk   9.6   7.1  3 X Average Risk  23.4   11.0        Use the calculated Patient Ratio above and the CHD Risk Table to determine the patient's CHD Risk.        ATP III CLASSIFICATION (LDL):  <100     mg/dL   Optimal  147-829  mg/dL   Near or Above                    Optimal  130-159  mg/dL   Borderline  562-130  mg/dL   High  >865     mg/dL   Very High* 7/84/6962   Lab Results  Component Value Date   TRIG 84.0 04/14/2010   TRIG 150* 05/14/2007   Lab Results  Component Value Date   CHOLHDL 3 04/14/2010   CHOLHDL 6.1 05/14/2007   No results found for this basename: LDLDIRECT    ICD interrogation is reviewed in detail.  She has a Btk Lumax 540 VR device imiplanted 02/24/09.  Her ventricular lead pacing/ sensing and impedance values are stable.  She has had more than 30 episodes of VT, all terminated with ATP since her EPS 3/14.  No shocks have been delivered.  Because of onset criteria, she had VT today classified as SVT for which therapy was not delivered and her tachycardia sustained for 3 hours.  The tachycardia CL of her VT is 300-320 msec.  Today, I have adjusted onset criteria to 16%  and also turned SVT time out on at 1 minute. No other changes are made today.  ASSESSMENT AND PLAN:  1. VT [name] presents  with sustained VT.  She has had multiple episodes of VT since her recent EP study, all ATP terminated except for today's episode which did not meet the onset criteria.  I have therefore adjusted onset criteria and SVT timeout as above.  No other device changes are made today. I have increased coreg to 37.5mg  BID today.  She is limited in antiarrhythmic drug options.  She may require further ablation. Per her request, she is discharged to home to followup with Dr Ladona Ridgel in the office.  I have sent a staff message to Dr Ladona Ridgel and his nurse to try to move up follow-up which is scheduled for late May. I have instructed the patient to not drive.  She will return should she have presyncope, syncope, worsening CHF, sustained arrhythmias, ICD shocks, or any other concerns.   Hillis Range, MD 08/27/2012  3:34 PM

## 2012-08-27 NOTE — ED Provider Notes (Signed)
History     CSN: [id number]  Arrival date & time 08/27/12  1133   First MD Initiated Contact with Patient 08/27/12 1145      Chief Complaint  Patient presents with  . Palpitations    (Consider location/radiation/quality/duration/timing/severity/associated sxs/prior treatment) HPI Comments: Pt is a 67 y/o female with hx of both ventricular tachycardia as well as supraventricular tachycardia who has been seen by Dr. Ladona Ridgel with cardiology in the past and has undergone electrophysiological studies and pacer defibrillator implantation. She presents today because of having palpitations this morning lasting approximately one and a half hours with associated nausea. She states is not unusual for her to have a short amount of palpitations as well as a mild amount of nausea but this usually resolves fairly quickly and spontaneously however today her symptoms have lasted much longer and her nausea is persistent. She has a nonischemic cardiomyopathy. She has not felt her differential for blood or fire today. She denies fevers, chills, and vomiting, diarrhea, rashes, cough, shortness of breath, swelling or headaches. The symptoms are persistent, gradually improving, nothing seems to make it better or worse.  Patient is a 67 y.o. female presenting with palpitations. The history is provided by the patient.  Palpitations     Past Medical History  Diagnosis Date  . Paroxysmal supraventricular tachycardia   . Hypokalemia   . Chronic systolic heart failure     a. 05/1912 Echo: EF 15%, mod to sev antlat and inflat HK, inf AK, mild to mod MR, mildly/mod reduced RV fxn, Mod TR, PASP .  [name] History of DVT (deep vein thrombosis)   . Dyslipidemia   . HTN (hypertension)   . Nonischemic cardiomyopathy     a. 06/2012 Echo: EF 15%;  b. 06/2012 Cath: nl except luminal irregs in LAD.  [name] Ventricular tachycardia     a. s/p ICD  . Goiter   . Implantable cardiac defibrillator- Biotronik     Single chamber  .  [name] (left bundle branch block)     intermittent    Past Surgical History  Procedure Laterality Date  . None    . Cardiac defibrillator placement      Family History  Problem Relation Age of Onset  . Emphysema Mother   . Heart disease Mother   . Heart attack Father   . Emphysema Brother     History  Substance Use Topics  . Smoking status: Never Smoker   . Smokeless tobacco: Not on file  . Alcohol Use: No    OB History   Grav Para Term Preterm Abortions TAB SAB Ect Mult Living                  Review of Systems  Cardiovascular: Positive for palpitations.  All other systems reviewed and are negative.    Allergies  Review of patient's allergies indicates no known allergies.  Home Medications   Current Outpatient Rx  Name  Route  Sig  Dispense  Refill  . acetaminophen (TYLENOL) 325 MG tablet   Oral   Take 650 mg by mouth every 6 (six) hours as needed for pain.         [name] aspirin EC 81 MG tablet   Oral   Take 81 mg by mouth daily.         . Cyanocobalamin (B-12 PO)   Oral   Take 1 tablet by mouth daily.         . digoxin (LANOXIN) 0.125 MG tablet  Oral   Take 1 tablet (0.125 mg total) by mouth daily.   30 tablet   6   . furosemide (LASIX) 40 MG tablet   Oral   Take 40 mg by mouth daily.         [name] lisinopril (PRINIVIL,ZESTRIL) 5 MG tablet   Oral   Take 1 tablet (5 mg total) by mouth 2 (two) times daily.   60 tablet   6   . Multiple Vitamin (MULTIVITAMIN WITH MINERALS) TABS   Oral   Take 1 tablet by mouth daily.         [name] omega-3 acid ethyl esters (LOVAZA) 1 G capsule   Oral   Take 2 g by mouth 2 (two) times daily.         . simvastatin (ZOCOR) 40 MG tablet   Oral   Take 40 mg by mouth at bedtime.         [name] spironolactone (ALDACTONE) 25 MG tablet   Oral   Take 1 tablet (25 mg total) by mouth daily.   30 tablet   6   . carvedilol (COREG) 12.5 MG tablet   Oral   Take 1 tablet (12.5 mg total) by mouth 2 (two) times daily  with a meal.   60 tablet   0   . carvedilol (COREG) 25 MG tablet   Oral   Take 1 tablet (25 mg total) by mouth 2 (two) times daily with a meal.   60 tablet   0   . carvedilol (COREG) 3.125 MG tablet   Oral   Take 1 tablet (3.125 mg total) by mouth 2 (two) times daily with a meal.   60 tablet   11     BP 91/42  Pulse 92  Temp(Src) 98.6 F (37 C) (Oral)  Resp 16  Wt 177 lb 7 oz (80.485 kg)  BMI 31.44 kg/m2  SpO2 100%  Physical Exam  Nursing note and vitals reviewed. Constitutional: She appears well-developed and well-nourished. No distress.  HENT:  Head: Normocephalic and atraumatic.  Mouth/Throat: Oropharynx is clear and moist. No oropharyngeal exudate.  Eyes: Conjunctivae and EOM are normal. Pupils are equal, round, and reactive to light. Right eye exhibits no discharge. Left eye exhibits no discharge. No scleral icterus.  Neck: Normal range of motion. Neck supple. No JVD present. No thyromegaly present.  Cardiovascular: Regular rhythm and intact distal pulses.  Exam reveals no gallop and no friction rub.   Murmur ( Soft) heard. Mild tachycardia to 105  Pulmonary/Chest: Effort normal and breath sounds normal. No respiratory distress. She has no wheezes. She has no rales.  Abdominal: Soft. Bowel sounds are normal. She exhibits no distension and no mass. There is no tenderness.  Musculoskeletal: Normal range of motion. She exhibits no edema and no tenderness.  Lymphadenopathy:    She has no cervical adenopathy.  Neurological: She is alert. Coordination normal.  Skin: Skin is warm and dry. No rash noted. No erythema.  Psychiatric: She has a normal mood and affect. Her behavior is normal.    ED Course  Procedures (including critical care time)  Labs Reviewed  URINALYSIS, ROUTINE W REFLEX MICROSCOPIC - Abnormal; Notable for the following:    Leukocytes, UA MODERATE (*)    All other components within normal limits  URINE MICROSCOPIC-ADD ON - Abnormal; Notable for the  following:    Squamous Epithelial / LPF FEW (*)    Bacteria, UA FEW (*)    All other components within normal  limits  POCT I-STAT, CHEM 8 - Abnormal; Notable for the following:    Creatinine, Ser 1.30 (*)    Glucose, Bld 168 (*)    All other components within normal limits  URINE CULTURE  CBC  POCT I-STAT TROPONIN I   Dg Chest Port 1 View  08/27/2012  *RADIOLOGY REPORT*  Clinical Data: Shortness of breath.  PORTABLE CHEST - 1 VIEW  Comparison: Chest x-ray 06/19/2012.  Findings: Lung volumes are normal.  Thickening of the minor fissure, similar to prior examinations.  No acute consolidative air space disease.  No pleural effusions.  Pulmonary vasculature is within normal limits.  Moderate cardiomegaly is unchanged.  Upper mediastinal contours are unremarkable.  Atherosclerosis in the thoracic aorta.  Left-sided pacemaker / AICD with single lead terminating projecting over the expected location of the right ventricular apex.  IMPRESSION: 1.  No definite radiographic evidence of acute cardiopulmonary disease. 2.  Moderate cardiomegaly is unchanged. 3.  Atherosclerosis.   Original Report Authenticated By: Trudie Reed, M.D.      1. Automatic implantable cardiac defibrillator in situ   2. Cardiomyopathy, nonischemic   3. Chronic systolic heart failure   4. Ventricular tachycardia   5. Tachycardia   6. Arrhythmia       MDM  Overall the patient is not appear in distress, she does have a mild tachycardia and some persistent nausea. Her EKG shows a left bundle branch block with left ventricular hypertrophy, she is not currently being paced and clinically has not felt any defibrillatory events.  We'll proceed with chest x-ray to evaluate lead placement, evaluate for infiltrates, urinalysis, labs and cardiac monitoring. We'll need to interrogate pacer defibrillator.  ED ECG REPORT  I personally interpreted this EKG   Date: 08/27/2012   Rate: 102  Rhythm: sinus tachycardia  QRS Axis:  left  Intervals: normal  ST/T Wave abnormalities: nonspecific ST/T changes  Conduction Disutrbances:left bundle branch block  Narrative Interpretation:   Old EKG Reviewed: unchanged compared with 07/06/2012, no significant changes   D/w Dr. Myrtis Ser, on call, will readress when interrogation done, labs done.  D/w Rep who is en route to interrogate. (12:35 PM)   Dr. Johney Frame with cardiology has seen the patient and agrees that she can increase the dose of the carvedilol 237.5 mg twice daily in hopes that that will improve the arrhythmia. There has been no further significant arrhythmias while here. Evaluation of the cardiac monitoring with a wrap shows that there were several episodes of prolonged tachycardia each which resolved with overdrive pacing.   Vida Roller, MD 08/27/12 [phone]

## 2012-08-28 LAB — URINE CULTURE

## 2012-08-29 ENCOUNTER — Encounter: Payer: Self-pay | Admitting: Internal Medicine

## 2012-08-29 ENCOUNTER — Ambulatory Visit (INDEPENDENT_AMBULATORY_CARE_PROVIDER_SITE_OTHER): Payer: Commercial Managed Care - PPO | Admitting: Internal Medicine

## 2012-08-29 ENCOUNTER — Encounter: Payer: Self-pay | Admitting: *Deleted

## 2012-08-29 VITALS — BP 98/66 | HR 95 | Ht 63.0 in | Wt 170.4 lb

## 2012-08-29 DIAGNOSIS — Z95810 Presence of automatic (implantable) cardiac defibrillator: Secondary | ICD-10-CM

## 2012-08-29 DIAGNOSIS — I472 Ventricular tachycardia: Secondary | ICD-10-CM

## 2012-08-29 DIAGNOSIS — I5022 Chronic systolic (congestive) heart failure: Secondary | ICD-10-CM

## 2012-08-29 NOTE — Patient Instructions (Signed)
Your physician recommends that you schedule a follow-up appointment in: 3 months with Dr Taylor   Your physician has requested that you have an echocardiogram. Echocardiography is a painless test that uses sound waves to create images of your heart. It provides your doctor with information about the size and shape of your heart and how well your heart's chambers and valves are working. This procedure takes approximately one hour. There are no restrictions for this procedure.    

## 2012-08-29 NOTE — Assessment & Plan Note (Signed)
Her device is working normally. I have reprogrammed her tachycardia zone from 180 beats per minute down to 170 beats per minute. We'll plan to recheck in several months.

## 2012-08-29 NOTE — Assessment & Plan Note (Signed)
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 My plan is to start her on antiarrhythmic drug therapy. The decision about which medications he use will be based on her left ventricular function. If her ejection fraction remains very low, amiodarone. If her ejection fraction is improved, sotalol.

## 2012-08-29 NOTE — Assessment & Plan Note (Signed)
Despite her history of severe left ventricular dysfunction, her heart failure symptoms are currently class II. We will continue her current medical therapy.

## 2012-08-29 NOTE — Progress Notes (Signed)
HPI Kaitlin [name] returns today for followup. She is a very pleasant 67 year old woman with a nonischemic cardiomyopathy and chronic systolic heart failure, status post ICD implantation. The patient was hospitalized several months ago with sustained wide QRS tachycardia, which is below her dissection on her ICD. My clinical impression at that time was that she was having SVT with aberration. She underwent EP study which demonstrated dual AV nodal physiology and double beats. In addition, rapid atrial pacing demonstrated a QRS morphology identical to her QRS in tachycardia. She underwent slow pathway ablation. Post ablation, mapping and catheter manipulation resulted in the induction of a second tachycardia. The QRS morphology and rate were identical to her clinical tachycardia. There is clear-cut VA dissociation. Because we could not reinduce the tachycardia with pacing or programmed stimulation, catheter ablation of her VT was not carried out. My clinical suspicion however was that this was bundle branch reentrant VT. In addition, programmed ventricular stimulation during the EP study demonstrated another ventricular tachycardia, with similar QRS morphology from her clinical tachycardia. This tachycardia however was faster and associated with hemodynamic compromise. After discharge from the hospital, the patient has had a couple of additional episodes of tachycardia. Several days ago she had recurrent tachycardia prompting emergency room evaluation. The tachycardia terminated spontaneously. She has been on no antiarrhythmic medical therapy. In the setting of 12 hours of tachycardia several months ago, the patient had an echo demonstrating severe left ventricular dysfunction, with an ejection fraction of 15%. No Known Allergies   Current Outpatient Prescriptions  Medication Sig Dispense Refill  . acetaminophen (TYLENOL) 325 MG tablet Take 650 mg by mouth every 6 (six) hours as needed for pain.      [name] aspirin  EC 81 MG tablet Take 81 mg by mouth daily.      . carvedilol (COREG) 12.5 MG tablet Take 1 tablet (12.5 mg total) by mouth 2 (two) times daily with a meal.  60 tablet  0  . carvedilol (COREG) 3.125 MG tablet Take 1 tablet (3.125 mg total) by mouth 2 (two) times daily with a meal.  60 tablet  11  . Cyanocobalamin (B-12 PO) Take 1 tablet by mouth daily.      . digoxin (LANOXIN) 0.125 MG tablet Take 1 tablet (0.125 mg total) by mouth daily.  30 tablet  6  . furosemide (LASIX) 40 MG tablet Take 40 mg by mouth daily.      [name] lisinopril (PRINIVIL,ZESTRIL) 5 MG tablet Take 1 tablet (5 mg total) by mouth 2 (two) times daily.  60 tablet  6  . Multiple Vitamin (MULTIVITAMIN WITH MINERALS) TABS Take 1 tablet by mouth daily.      . simvastatin (ZOCOR) 40 MG tablet Take 40 mg by mouth at bedtime.      [name] spironolactone (ALDACTONE) 25 MG tablet Take 1 tablet (25 mg total) by mouth daily.  30 tablet  6   No current facility-administered medications for this visit.     Past Medical History  Diagnosis Date  . Paroxysmal supraventricular tachycardia   . Hypokalemia   . Chronic systolic heart failure     a. 05/6107 Echo: EF 15%, mod to sev antlat and inflat HK, inf AK, mild to mod MR, mildly/mod reduced RV fxn, Mod TR, PASP .  [name] History of DVT (deep vein thrombosis)   . Dyslipidemia   . HTN (hypertension)   . Nonischemic cardiomyopathy     a. 06/2012 Echo: EF 15%;  b. 06/2012 Cath: nl except luminal  irregs in LAD.  [name] Ventricular tachycardia     a. s/p ICD  . Goiter   . Implantable cardiac defibrillator- Biotronik     Single chamber  . [name] (left bundle branch block)     intermittent    ROS:   All systems reviewed and negative except as noted in the HPI.   Past Surgical History  Procedure Laterality Date  . None    . Cardiac defibrillator placement       Family History  Problem Relation Age of Onset  . Emphysema Mother   . Heart disease Mother   . Heart attack Father   . Emphysema  Brother      History   Social History  . Marital Status: Single    Spouse Name: N/A    Number of Children: N/A  . Years of Education: N/A   Occupational History  . 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 Smoking status: Never Smoker   . Smokeless tobacco: Not on file  . Alcohol Use: No  . Drug Use: No  . Sexually Active: Not on file   Other Topics Concern  . Not on file   Social History Narrative  . No narrative on file     BP 98/66  Pulse 95  Ht 5\' 3"  (1.6 m)  Wt 170 lb 6.4 oz (77.293 kg)  BMI 30.19 kg/m2  Physical Exam:  Well appearing but anxious and tearful middle-aged woman,NAD HEENT: Unremarkable Neck:  7 cm JVD, no thyromegally Lungs:  Clear with no wheezes, rales, or rhonchi. HEART:  Regular rate rhythm, no murmurs, no rubs, no clicks Abd:  soft, positive bowel sounds, no organomegally, no rebound, no guarding Ext:  2 plus pulses, no edema, no cyanosis, no clubbing Skin:  No rashes no nodules Neuro:  CN II through XII intact, motor grossly intact  EKG normal sinus rhythm with PVCs and couplets.  DEVICE  Normal device function.  See PaceArt for details.   Assess/Plan:

## 2012-08-30 LAB — ICD DEVICE OBSERVATION
DEV-0020ICD: NEGATIVE
HV IMPEDENCE: 44 Ohm
RV LEAD AMPLITUDE: 11.9 mv
RV LEAD THRESHOLD: 0.7 V
TZAT-0001FASTVT: 1
TZAT-0001SLOWVT: 2
TZAT-0004SLOWVT: 8
TZAT-0012SLOWVT: 200 ms
TZAT-0013SLOWVT: 4
TZAT-0013SLOWVT: 4
TZAT-0018SLOWVT: NEGATIVE
TZAT-0019FASTVT: 7.5 V
TZAT-0019SLOWVT: 7.5 V
TZAT-0019SLOWVT: 7.5 V
TZAT-0020FASTVT: 1.5 ms
TZAT-0020SLOWVT: 1.5 ms
TZAT-0020SLOWVT: 1.5 ms
TZON-0004SLOWVT: 16
TZON-0005FASTVT: 10
TZON-0005SLOWVT: 12
TZON-0010FASTVT: 24 ms
TZON-0010SLOWVT: 24 ms
TZST-0001FASTVT: 2
TZST-0001FASTVT: 4
TZST-0001FASTVT: 6
TZST-0001FASTVT: 7
TZST-0001FASTVT: 9
TZST-0001SLOWVT: 3
TZST-0001SLOWVT: 5
TZST-0001SLOWVT: 6
TZST-0001SLOWVT: 7
TZST-0001SLOWVT: 9
TZST-0003FASTVT: 30 J
TZST-0003FASTVT: 40 J
TZST-0003FASTVT: 40 J
TZST-0003SLOWVT: 30 J
TZST-0003SLOWVT: 40 J
TZST-0003SLOWVT: 40 J
TZST-0003SLOWVT: 40 J

## 2012-09-05 ENCOUNTER — Ambulatory Visit (HOSPITAL_COMMUNITY): Payer: Commercial Managed Care - PPO | Attending: Cardiovascular Disease | Admitting: Radiology

## 2012-09-05 DIAGNOSIS — I509 Heart failure, unspecified: Secondary | ICD-10-CM

## 2012-09-05 DIAGNOSIS — Z8674 Personal history of sudden cardiac arrest: Secondary | ICD-10-CM | POA: Insufficient documentation

## 2012-09-05 DIAGNOSIS — I447 Left bundle-branch block, unspecified: Secondary | ICD-10-CM | POA: Insufficient documentation

## 2012-09-05 DIAGNOSIS — I501 Left ventricular failure: Secondary | ICD-10-CM | POA: Insufficient documentation

## 2012-09-05 DIAGNOSIS — I472 Ventricular tachycardia, unspecified: Secondary | ICD-10-CM | POA: Insufficient documentation

## 2012-09-05 DIAGNOSIS — I10 Essential (primary) hypertension: Secondary | ICD-10-CM | POA: Insufficient documentation

## 2012-09-05 DIAGNOSIS — E785 Hyperlipidemia, unspecified: Secondary | ICD-10-CM | POA: Insufficient documentation

## 2012-09-05 DIAGNOSIS — I428 Other cardiomyopathies: Secondary | ICD-10-CM | POA: Insufficient documentation

## 2012-09-05 DIAGNOSIS — E669 Obesity, unspecified: Secondary | ICD-10-CM | POA: Insufficient documentation

## 2012-09-05 DIAGNOSIS — I4729 Other ventricular tachycardia: Secondary | ICD-10-CM | POA: Insufficient documentation

## 2012-09-05 NOTE — Progress Notes (Signed)
Echocardiogram performed.  

## 2012-09-13 ENCOUNTER — Other Ambulatory Visit: Payer: Self-pay | Admitting: *Deleted

## 2012-09-13 MED ORDER — LISINOPRIL 5 MG PO TABS: 5.0000 mg | ORAL_TABLET | Freq: Two times a day (BID) | ORAL | Status: DC

## 2012-09-13 MED ORDER — DIGOXIN 125 MCG PO TABS: 0.1250 mg | ORAL_TABLET | Freq: Every day | ORAL | Status: DC

## 2012-09-15 ENCOUNTER — Telehealth: Payer: Self-pay | Admitting: Internal Medicine

## 2012-09-15 NOTE — Telephone Encounter (Signed)
Spoke with patient and she is aware that I will refer her to the [facility] clinic and ask Dr Ladona Ridgel about her medication.  I have placed a call to [facility]

## 2012-09-15 NOTE — Telephone Encounter (Signed)
New problem:  Calling for test results.  

## 2012-09-21 NOTE — Telephone Encounter (Signed)
Appointment scheduled with Dr Gala Romney

## 2012-09-22 ENCOUNTER — Encounter: Payer: Commercial Managed Care - PPO | Admitting: Internal Medicine

## 2012-10-05 ENCOUNTER — Encounter (HOSPITAL_COMMUNITY): Payer: Self-pay | Admitting: *Deleted

## 2012-10-05 ENCOUNTER — Ambulatory Visit (HOSPITAL_COMMUNITY)
Admission: RE | Admit: 2012-10-05 | Discharge: 2012-10-05 | Disposition: A | Discharge status: Home / Self Care | Payer: Commercial Managed Care - PPO | Source: Ambulatory Visit | Attending: Internal Medicine | Admitting: Internal Medicine

## 2012-10-05 VITALS — BP 120/82 | HR 86 | Wt 168.0 lb

## 2012-10-05 DIAGNOSIS — Z95810 Presence of automatic (implantable) cardiac defibrillator: Secondary | ICD-10-CM | POA: Insufficient documentation

## 2012-10-05 DIAGNOSIS — I5022 Chronic systolic (congestive) heart failure: Secondary | ICD-10-CM

## 2012-10-05 DIAGNOSIS — E785 Hyperlipidemia, unspecified: Secondary | ICD-10-CM | POA: Insufficient documentation

## 2012-10-05 DIAGNOSIS — I447 Left bundle-branch block, unspecified: Secondary | ICD-10-CM | POA: Insufficient documentation

## 2012-10-05 DIAGNOSIS — I10 Essential (primary) hypertension: Secondary | ICD-10-CM | POA: Insufficient documentation

## 2012-10-05 DIAGNOSIS — I509 Heart failure, unspecified: Secondary | ICD-10-CM | POA: Insufficient documentation

## 2012-10-05 MED ORDER — LISINOPRIL 5 MG PO TABS: 10.0000 mg | ORAL_TABLET | Freq: Two times a day (BID) | ORAL | Status: DC

## 2012-10-05 NOTE — Addendum Note (Signed)
Encounter addended by: Dolores Patty, MD on: 10/05/2012  2:32 PM<BR>     Documentation filed: Patient Instructions Section, Orders

## 2012-10-05 NOTE — Assessment & Plan Note (Addendum)
Overall doing fairly well. NYHA II. Volume status mildly elevated. We talked about checking daily weights and taking extra lasix if needed for weight gain of 3-4 pounds. Will increase lisinopril to 10 bid. My suspicion is that her LV dysfunction may be tachy-induced so I agree with Dr. Ladona Ridgel and Dr. Johney Frame that it will be very important to control her arrhythmias and even undergo another ablation as needed. We will interrogate her device soon. If there is suppression of her VT we will then repeat an echo to see if she has had any LV recovery. We did discuss the possibility of advanced therapies but she does not need them currently. Will see back in 1 month to see how she is doing and increase her ACE-I as tolerated. Not candidate for cMRI due to ICD.   Total time spent 60 minutes with approximately discussing the above with her and her daughter.

## 2012-10-05 NOTE — Progress Notes (Signed)
Patient ID: Kaitlin [name], female   DOB: [birth year], 67 y.o.   MRN: [id number]  Referring Physician: Primary Care: Primary Cardiologist: Ladona Ridgel  HPI:  Kaitlin [name] is a 67 y/o woman with h/o severe HTN, [name], VT s/p Biotronik ICD (2009) and 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 Referred to HF Clinic by Dr. Ladona Ridgel.    In 2009, she was found to have EF 45% in setting of severe HTN. Also found to have VT and underwent ICD placement. Between 2009 and 2014 had done very well. Was following with Dr. Antoine Poche.  In 2/14, admitted with SVT and shock . EF was 15% by echo with moderate RV dysfunction. Underwent RHC/LHC with Dr. Shirlee Latch which showed minimal CAD. RHC as below. Treated medically. Arranged for outpatient SVT ablation.   Hemodynamics (mmHg)  RA 10  RV 48/8  PA 42/16, mean 29  PCWP mean 14  LV 118/29  AO 122/82  Oxygen saturations:  PA 57%  AO 98%  Cardiac Output (Fick) 3.71  Cardiac Index (Fick) 2.0  PVR 4 WU  Cardiac Output (Thermo) 2.45  Cardiac Index (Thermo) 1.3   In 3/14 underwent EPS which showed SVT and VT. SVT ablated but couldn't get VT. Started on amiodarone and device reprogrammed.   On August 27 2012 came to ER for sustained palpitations with associated nausea and weakness. ICD interrogated with sustained VT which was classified as SVT and for which therapy was not delivered. She remained in tachycardia for 3 hours before it terminated. She has also been observed to have multiple episodes of VT which have been ATP terminated. Seen by Dr. Johney Frame and carvedilol increased to 37.5 bid. Scheduled for f/u with Dr. Ladona Ridgel for consideration of repeat ablation.  Echo 09/05/12. EF 10% with moderate RV dysfunction. Referred to HF Clinic for further management.  Continues to work full-time at the [employer]. Feels like she is getting stronger. Walks 30 mins three times per week. Walks slowly but doesn't have to stop. Gets winded when she goes up hill. No edema. On carvedilol 37.5 bid  and lisinopril 5 bid. Weight and BP stable. Mild dizziness if she gets up too quickly. Doesn't weight every day. Has lost 14 pounds in last few months.  She drinks a lot of water. Never takes extra lasix. Denies palpitations currently.   Review of Systems: [y] = yes, [ ]  = no   General: Weight gain [ ] ; Weight loss [name] ]; Anorexia [ ] ; Fatigue [ ] ; Fever [ ] ; Chills [ ] ; Weakness [ ]   Cardiac: Chest pain/pressure [ ] ; Resting SOB [ ] ; Exertional SOB [name] ]; Orthopnea [ ] ; Pedal Edema [ ] ; Palpitations [ y]; Syncope [ ] ; Presyncope [ ] ; Paroxysmal nocturnal dyspnea[ ]   Pulmonary: Cough [ ] ; Wheezing[ ] ; Hemoptysis[ ] ; Sputum [ ] ; Snoring [ ]   GI: Vomiting[ ] ; Dysphagia[ ] ; Melena[ ] ; Hematochezia [ ] ; Heartburn[ ] ; Abdominal pain [ ] ; Constipation [ ] ; Diarrhea [ ] ; BRBPR [ ]   GU: Hematuria[ ] ; Dysuria [ ] ; Nocturia[ ]   Vascular: Pain in legs with walking [ ] ; Pain in feet with lying flat [ ] ; Non-healing sores [ ] ; Stroke [ ] ; TIA [ ] ; Slurred speech [ ] ;  Neuro: Headaches[ ] ; Vertigo[ ] ; Seizures[ ] ; Paresthesias[ ] ;Blurred vision [ ] ; Diplopia [ ] ; Vision changes [ ]   Ortho/Skin: Arthritis [ ] ; Joint pain [ ] ; Muscle pain [ ] ; Joint swelling [ ] ; Back Pain [ ] ; Rash [ ]   Psych: Depression[ ] ; Anxiety[ ]   Heme: Bleeding problems [ ] ; Clotting disorders [ ] ; Anemia [ ]   Endocrine: Diabetes [ ] ; Thyroid dysfunction[ ]    Past Medical History  Diagnosis Date  . Paroxysmal supraventricular tachycardia   . Hypokalemia   . Chronic systolic heart failure     a. 08/979 Echo: EF 15%, mod to sev antlat and inflat HK, inf AK, mild to mod MR, mildly/mod reduced RV fxn, Mod TR, PASP .  [name] History of DVT (deep vein thrombosis)   . Dyslipidemia   . HTN (hypertension)   . Nonischemic cardiomyopathy     a. 06/2012 Echo: EF 15%;  b. 06/2012 Cath: nl except luminal irregs in LAD.  [name] Ventricular tachycardia     a. s/p ICD  . Goiter   . Implantable cardiac defibrillator- Biotronik     Single chamber  .  [name] (left bundle branch block)     intermittent    Current Outpatient Prescriptions  Medication Sig Dispense Refill  . acetaminophen (TYLENOL) 325 MG tablet Take 650 mg by mouth every 6 (six) hours as needed for pain.      [name] aspirin EC 81 MG tablet Take 81 mg by mouth daily.      . carvedilol (COREG) 25 MG tablet Take 37.5 mg by mouth 2 (two) times daily with a meal.      . Cyanocobalamin (B-12 PO) Take 1 tablet by mouth daily.      . digoxin (LANOXIN) 0.125 MG tablet Take 1 tablet (0.125 mg total) by mouth daily.  30 tablet  6  . furosemide (LASIX) 40 MG tablet Take 40 mg by mouth daily.      [name] lisinopril (PRINIVIL,ZESTRIL) 5 MG tablet Take 1 tablet (5 mg total) by mouth 2 (two) times daily.  60 tablet  6  . Multiple Vitamin (MULTIVITAMIN WITH MINERALS) TABS Take 1 tablet by mouth daily.      . simvastatin (ZOCOR) 40 MG tablet Take 40 mg by mouth at bedtime.      [name] spironolactone (ALDACTONE) 25 MG tablet Take 1 tablet (25 mg total) by mouth daily.  30 tablet  6   No current facility-administered medications for this encounter.    No Known Allergies  History   Social History  . Marital Status: Single    Spouse Name: N/A    Number of Children: N/A  . Years of Education: N/A   Occupational History  . 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 Smoking status: Never Smoker   . Smokeless tobacco: Not on file  . Alcohol Use: No  . Drug Use: No  . Sexually Active: Not on file   Other Topics Concern  . Not on file   Social History Narrative  . No narrative on file    Family History  Problem Relation Age of Onset  . Emphysema Mother   . Heart disease Mother   . Heart attack Father   . Emphysema Brother     PHYSICAL EXAM: Filed Vitals:   10/05/12 1319  BP: 120/82  Pulse: 86  Weight: 168 lb (76.204 kg)  SpO2: 98%    General:  Well appearing. No respiratory difficulty HEENT: normal Neck: supple. no JVD 7-8. Carotids 2+ bilat; no bruits.  No lymphadenopathy or thryomegaly appreciated. Cor: PMI nonpalpable. Distant HS Regular rate & rhythm. No rubs, gallops or murmurs. Lungs: clear Abdomen: soft, nontender, nondistended. No hepatosplenomegaly. No bruits or masses. Good bowel sounds. Extremities: no cyanosis, clubbing, rash, 1+  edema Neuro: alert & oriented x 3, cranial nerves grossly intact. moves all 4 extremities w/o difficulty. Affect pleasant.   ASSESSMENT & PLAN:

## 2012-10-05 NOTE — Patient Instructions (Addendum)
Appt with device clinic 6/9 @ 12:00 pm.  Increased lisinopril to 10 mg twice a day.  Follow up 1 month with ECHO.  Can take extra lasix (fluid pill) if weight greater than 3 lbs in a day.   Do the following things EVERYDAY: 1) Weigh yourself in the morning before breakfast. Write it down and keep it in a log. 2) Take your medicines as prescribed 3) Eat low salt foods-Limit salt (sodium) to 2000 mg per day.  4) Stay as active as you can everyday 5) Limit all fluids for the day to less than 2 liters  Call any issues [phone].

## 2012-10-09 ENCOUNTER — Encounter: Payer: Self-pay | Admitting: *Deleted

## 2012-10-09 ENCOUNTER — Other Ambulatory Visit (HOSPITAL_COMMUNITY): Payer: Self-pay | Admitting: *Deleted

## 2012-10-09 ENCOUNTER — Ambulatory Visit (INDEPENDENT_AMBULATORY_CARE_PROVIDER_SITE_OTHER): Payer: Commercial Managed Care - PPO | Admitting: *Deleted

## 2012-10-09 DIAGNOSIS — I428 Other cardiomyopathies: Secondary | ICD-10-CM

## 2012-10-09 DIAGNOSIS — R570 Cardiogenic shock: Secondary | ICD-10-CM

## 2012-10-09 LAB — ICD DEVICE OBSERVATION
DEVICE MODEL ICD: 60485337
FVT: 0
TOT-0001: 2
TOT-0002: 3
TZAT-0004SLOWVT: 8
TZAT-0005FASTVT: 85 pct
TZAT-0005SLOWVT: 85 pct
TZAT-0012FASTVT: 200 ms
TZAT-0012SLOWVT: 200 ms
TZAT-0013FASTVT: 2
TZAT-0018FASTVT: NEGATIVE
TZAT-0019FASTVT: 7.5 V
TZON-0003FASTVT: 310 ms
TZON-0004FASTVT: 30
TZON-0004SLOWVT: 40
TZST-0001FASTVT: 3
TZST-0001FASTVT: 6
TZST-0001FASTVT: 9
TZST-0003FASTVT: 40 J
TZST-0003FASTVT: 40 J
TZST-0003FASTVT: 40 J
VENTRICULAR PACING ICD: 0 pct
VF: 1

## 2012-10-09 MED ORDER — LISINOPRIL 10 MG PO TABS: 10.0000 mg | ORAL_TABLET | Freq: Two times a day (BID) | ORAL | Status: DC

## 2012-10-09 NOTE — Progress Notes (Signed)
icd check in clinic  

## 2012-10-31 ENCOUNTER — Other Ambulatory Visit: Payer: Self-pay | Admitting: Cardiology

## 2012-11-01 ENCOUNTER — Ambulatory Visit (HOSPITAL_BASED_OUTPATIENT_CLINIC_OR_DEPARTMENT_OTHER)
Admission: RE | Admit: 2012-11-01 | Discharge: 2012-11-01 | Disposition: A | Discharge status: Home / Self Care | Payer: Commercial Managed Care - PPO | Source: Ambulatory Visit | Attending: Internal Medicine | Admitting: Internal Medicine

## 2012-11-01 ENCOUNTER — Telehealth (HOSPITAL_COMMUNITY): Payer: Self-pay | Admitting: Adult Health

## 2012-11-01 ENCOUNTER — Other Ambulatory Visit: Payer: Self-pay

## 2012-11-01 ENCOUNTER — Ambulatory Visit (HOSPITAL_COMMUNITY)
Admission: RE | Admit: 2012-11-01 | Discharge: 2012-11-01 | Disposition: A | Discharge status: Home / Self Care | Payer: Commercial Managed Care - PPO | Source: Ambulatory Visit | Attending: Family Medicine | Admitting: Family Medicine

## 2012-11-01 VITALS — BP 100/62 | HR 84 | Wt 165.0 lb

## 2012-11-01 DIAGNOSIS — Z86718 Personal history of other venous thrombosis and embolism: Secondary | ICD-10-CM | POA: Insufficient documentation

## 2012-11-01 DIAGNOSIS — I472 Ventricular tachycardia: Secondary | ICD-10-CM | POA: Insufficient documentation

## 2012-11-01 DIAGNOSIS — I079 Rheumatic tricuspid valve disease, unspecified: Secondary | ICD-10-CM | POA: Insufficient documentation

## 2012-11-01 DIAGNOSIS — I498 Other specified cardiac arrhythmias: Secondary | ICD-10-CM | POA: Insufficient documentation

## 2012-11-01 DIAGNOSIS — I5022 Chronic systolic (congestive) heart failure: Secondary | ICD-10-CM

## 2012-11-01 DIAGNOSIS — E785 Hyperlipidemia, unspecified: Secondary | ICD-10-CM | POA: Insufficient documentation

## 2012-11-01 DIAGNOSIS — I2789 Other specified pulmonary heart diseases: Secondary | ICD-10-CM | POA: Insufficient documentation

## 2012-11-01 DIAGNOSIS — I519 Heart disease, unspecified: Secondary | ICD-10-CM | POA: Insufficient documentation

## 2012-11-01 DIAGNOSIS — Z7982 Long term (current) use of aspirin: Secondary | ICD-10-CM | POA: Insufficient documentation

## 2012-11-01 DIAGNOSIS — E876 Hypokalemia: Secondary | ICD-10-CM | POA: Insufficient documentation

## 2012-11-01 DIAGNOSIS — I428 Other cardiomyopathies: Secondary | ICD-10-CM | POA: Insufficient documentation

## 2012-11-01 DIAGNOSIS — I059 Rheumatic mitral valve disease, unspecified: Secondary | ICD-10-CM | POA: Insufficient documentation

## 2012-11-01 DIAGNOSIS — Z79899 Other long term (current) drug therapy: Secondary | ICD-10-CM | POA: Insufficient documentation

## 2012-11-01 DIAGNOSIS — I369 Nonrheumatic tricuspid valve disorder, unspecified: Secondary | ICD-10-CM

## 2012-11-01 DIAGNOSIS — G4700 Insomnia, unspecified: Secondary | ICD-10-CM | POA: Insufficient documentation

## 2012-11-01 DIAGNOSIS — I10 Essential (primary) hypertension: Secondary | ICD-10-CM | POA: Insufficient documentation

## 2012-11-01 DIAGNOSIS — I509 Heart failure, unspecified: Secondary | ICD-10-CM | POA: Insufficient documentation

## 2012-11-01 LAB — BASIC METABOLIC PANEL
BUN: 36 mg/dL — ABNORMAL HIGH (ref 6–23)
CO2: 27 mEq/L (ref 19–32)
Chloride: 97 mEq/L (ref 96–112)
Creatinine, Ser: 1.91 mg/dL — ABNORMAL HIGH (ref 0.50–1.10)
Glucose, Bld: 108 mg/dL — ABNORMAL HIGH (ref 70–99)

## 2012-11-01 MED ORDER — LISINOPRIL 10 MG PO TABS: 5.0000 mg | ORAL_TABLET | Freq: Two times a day (BID) | ORAL | Status: DC

## 2012-11-01 MED ORDER — CARVEDILOL 25 MG PO TABS: 25.0000 mg | ORAL_TABLET | Freq: Two times a day (BID) | ORAL | Status: DC

## 2012-11-01 MED ORDER — AMIODARONE HCL 200 MG PO TABS: 400.0000 mg | ORAL_TABLET | Freq: Two times a day (BID) | ORAL | Status: DC

## 2012-11-01 NOTE — Telephone Encounter (Signed)
Faxed refill of Digoxin 0.125 mg #90 to [facility] per [facility] [employer]

## 2012-11-01 NOTE — Assessment & Plan Note (Addendum)
Device interrogated 10/09/12. 22 VT events noted. EF remains severely reduced.  Start amiodarone 400 mg twice daily. Follow up Dr Ladona Ridgel next month.   Attending: ICD interrogation with multiple episodes VT and 1 episode VF. I do not think VT burden  is high enough to actually cause LV dysfunction, rather VT may be due to severe LV dysfunction. Will start amiodarone 400 bid as per Dr. Lubertha Basque note.

## 2012-11-01 NOTE — Telephone Encounter (Signed)
Left a message to return call.  Creatinine 1.9 Potassium 5.4 . Discussed with Dr Gala Romney.   Plan to instruct her to cut back lisinopril to 5 mg twice a day. Will need repeat BMET next week.    [name],[name] 4:49 PM

## 2012-11-01 NOTE — Assessment & Plan Note (Addendum)
Dr Gala Romney discussed and reviewed ECHO. EF remains severely reduced ~10-15%. Will need CPX as part of advanced therapy work up.  Follow up in 1 month to discuss CPX. Check BMET today.   Patient seen and examined with Tonye Becket, NP. We discussed all aspects of the encounter. I agree with the assessment and plan as stated above. Echo reviewed with her and her daughter. She continues with severe LV dysfunction EF 15%. BP too low to titrate meds further. Long discussion about possibility of advanced therapies. Will proceed with CPX testing to further risk stratify.

## 2012-11-01 NOTE — Patient Instructions (Addendum)
Follow up in 6 weeks with Dr Gala Romney  Take Amiodarone 400 mg twice a day  Cut back carvedilol to 25 mg twice a day  CPX test  Do the following things EVERYDAY: 1) Weigh yourself in the morning before breakfast. Write it down and keep it in a log. 2) Take your medicines as prescribed 3) Eat low salt foods-Limit salt (sodium) to 2000 mg per day.  4) Stay as active as you can everyday 5) Limit all fluids for the day to less than 2 liters

## 2012-11-01 NOTE — Progress Notes (Signed)
Patient ID: Kaitlin [name], female   DOB: 16-Dec-1945, 67 y.o.   MRN: [id number] Referring Physician: Primary Care: Primary Cardiologist: Ladona Ridgel  HPI:  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 Referred to HF Clinic by Dr. Ladona Ridgel.    In 2009, she was found to have EF 45% in setting of severe HTN. Also found to have VT and underwent ICD placement. Between 2009 and 2014 had done very well. Was following with Dr. Antoine Poche.  In 2/14, admitted with SVT and shock . EF was 15% by echo with moderate RV dysfunction. Underwent RHC/LHC with Dr. Shirlee Latch which showed minimal CAD. RHC as below. Treated medically. Arranged for outpatient SVT ablation.   Hemodynamics (mmHg)  RA 10  RV 48/8  PA 42/16, mean 29  PCWP mean 14  LV 118/29  AO 122/82  Oxygen saturations:  PA 57%  AO 98%  Cardiac Output (Fick) 3.71  Cardiac Index (Fick) 2.0  PVR 4 WU  Cardiac Output (Thermo) 2.45  Cardiac Index (Thermo) 1.3   In 3/14 underwent EPS which showed SVT and VT. SVT ablated but couldn't get VT. Started on amiodarone and device reprogrammed.   On August 27 2012 came to ER for sustained palpitations with associated nausea and weakness. ICD interrogated with sustained VT which was classified as SVT and for which therapy was not delivered. She remained in tachycardia for 3 hours before it terminated. She has also been observed to have multiple episodes of VT which have been ATP terminated. Seen by Dr. Johney Frame and carvedilol increased to 37.5 bid. Scheduled for f/u with Dr. Ladona Ridgel for consideration of repeat ablation.  Echo 09/05/12. EF 10% with moderate RV dysfunction. Referred to HF Clinic for further management.  10/09/12 Biotronik Device Interrogated- 22 episodes VT successfully treated with ATP. On May 31 one episode V-Fib   ECHO 11/01/12 EF 10-15% RV moderately dilated  She returns for follow up. Last visit lisinopril was increased to 10 mg bid.  Occasionally dizziness. Denies SOB/PND/Orthopnea. No palpitations. Continues to work full-time at the [employer]. She drinks a lot of water. Weight at home 163 pounds. Never takes extra lasix.    Past Medical History  Diagnosis Date  . Paroxysmal supraventricular tachycardia   . Hypokalemia   . Chronic systolic heart failure     a. 08/979 Echo: EF 15%, mod to sev antlat and inflat HK, inf AK, mild to mod MR, mildly/mod reduced RV fxn, Mod TR, PASP .  Kaitlin [name] History of DVT (deep vein thrombosis)   . Dyslipidemia   . HTN (hypertension)   . Nonischemic cardiomyopathy     a. 06/2012 Echo: EF 15%;  b. 06/2012 Cath: nl except luminal irregs in LAD.  Kaitlin [name] Ventricular tachycardia     a. s/p ICD  . Goiter   . Implantable cardiac defibrillator- Biotronik     Single chamber  . [name] (left bundle branch block)     intermittent    Current Outpatient Prescriptions  Medication Sig Dispense Refill  . acetaminophen (TYLENOL) 325 MG tablet Take 650 mg by mouth every 6 (six) hours as needed for pain.      Kaitlin [name] aspirin EC 81 MG tablet Take 81 mg by mouth daily.      . carvedilol (COREG) 25 MG tablet Take 37.5 mg by mouth 2 (two) times daily with a meal.      . Cyanocobalamin (B-12 PO) Take 1 tablet  by mouth daily.      . digoxin (LANOXIN) 0.125 MG tablet Take 1 tablet (0.125 mg total) by mouth daily.  30 tablet  6  . furosemide (LASIX) 40 MG tablet Take 40 mg by mouth daily.      Kaitlin [name] lisinopril (PRINIVIL,ZESTRIL) 10 MG tablet Take 1 tablet (10 mg total) by mouth 2 (two) times daily.  180 tablet  3  . simvastatin (ZOCOR) 40 MG tablet TAKE 1 TABLET AT BEDTIME  90 tablet  3  . spironolactone (ALDACTONE) 25 MG tablet Take 1 tablet (25 mg total) by mouth daily.  30 tablet  6   No current facility-administered medications for this encounter.    No Known Allergies  History   Social History  . Marital Status: Single    Spouse Name: N/A    Number of Children: N/A  . Years of Education: N/A    Occupational History  . 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 Smoking status: Never Smoker   . Smokeless tobacco: Not on file  . Alcohol Use: No  . Drug Use: No  . Sexually Active: Not on file   Other Topics Concern  . Not on file   Social History Narrative  . No narrative on file    Family History  Problem Relation Age of Onset  . Emphysema Mother   . Heart disease Mother   . Heart attack Father   . Emphysema Brother     PHYSICAL EXAM: Filed Vitals:   11/01/12 1457  BP: 100/62  Pulse: 84  Weight: 165 lb (74.844 kg)  SpO2: 99%    General:  Well appearing. No respiratory difficulty Daughter present HEENT: normal Neck: supple. no JVD 7-8. Carotids 2+ bilat; no bruits. No lymphadenopathy or thryomegaly appreciated. Cor: PMI nonpalpable. Distant HS Regular rate & rhythm. No rubs, gallops or murmurs. Lungs: clear Abdomen: soft, nontender, nondistended. No hepatosplenomegaly. No bruits or masses. Good bowel sounds. Extremities: no cyanosis, clubbing, rash, edema Neuro: alert & oriented x 3, cranial nerves grossly intact. moves all 4 extremities w/o difficulty. Affect pleasant.   ASSESSMENT & PLAN:

## 2012-11-01 NOTE — Progress Notes (Signed)
  Echocardiogram 2D Echocardiogram has been performed.  Danay Mckellar 11/01/2012, 3:02 PM

## 2012-11-02 NOTE — Telephone Encounter (Signed)
Pt aware and will decrease med, will recheck bmet on 7/10 after CPX

## 2012-11-09 ENCOUNTER — Telehealth (HOSPITAL_COMMUNITY): Payer: Self-pay | Admitting: Adult Health

## 2012-11-09 ENCOUNTER — Ambulatory Visit (HOSPITAL_COMMUNITY): Payer: Commercial Managed Care - PPO

## 2012-11-09 ENCOUNTER — Ambulatory Visit (HOSPITAL_COMMUNITY)
Admission: RE | Admit: 2012-11-09 | Discharge: 2012-11-09 | Disposition: A | Discharge status: Home / Self Care | Payer: Commercial Managed Care - PPO | Source: Ambulatory Visit | Attending: Internal Medicine | Admitting: Internal Medicine

## 2012-11-09 VITALS — BP 90/60 | HR 85

## 2012-11-09 DIAGNOSIS — I5022 Chronic systolic (congestive) heart failure: Secondary | ICD-10-CM

## 2012-11-09 LAB — BASIC METABOLIC PANEL
BUN: 34 mg/dL — ABNORMAL HIGH (ref 6–23)
Calcium: 10.9 mg/dL — ABNORMAL HIGH (ref 8.4–10.5)
Chloride: 99 mEq/L (ref 96–112)
Creatinine, Ser: 1.89 mg/dL — ABNORMAL HIGH (ref 0.50–1.10)
Potassium: 4.3 mEq/L (ref 3.5–5.1)

## 2012-11-09 MED ORDER — LISINOPRIL 10 MG PO TABS: 5.0000 mg | ORAL_TABLET | Freq: Every day | ORAL | Status: DC

## 2012-11-09 NOTE — Telephone Encounter (Signed)
Provided with lab results. Creatinine 1.8   Discussed with Dr Gala Romney. Cut back lisinopril to 5 mg daily.   Repeat BMET next week at [facility].   [name],[name] 5:02 PM

## 2012-11-09 NOTE — Addendum Note (Signed)
Addended by: Noralee Space on: 11/09/2012 05:04 PM   Modules accepted: Orders

## 2012-11-09 NOTE — Progress Notes (Signed)
Pt for nurse visit to check BP and have bmet drawn.

## 2012-11-15 ENCOUNTER — Other Ambulatory Visit (INDEPENDENT_AMBULATORY_CARE_PROVIDER_SITE_OTHER): Payer: Commercial Managed Care - PPO

## 2012-11-15 DIAGNOSIS — I5022 Chronic systolic (congestive) heart failure: Secondary | ICD-10-CM

## 2012-11-15 LAB — BASIC METABOLIC PANEL
BUN: 47 mg/dL — ABNORMAL HIGH (ref 6–23)
GFR: 25.33 mL/min — ABNORMAL LOW (ref >60.00)
Glucose, Bld: 119 mg/dL — ABNORMAL HIGH (ref 70–99)
Potassium: 5.2 mEq/L — ABNORMAL HIGH (ref 3.5–5.1)

## 2012-11-17 ENCOUNTER — Telehealth (HOSPITAL_COMMUNITY): Payer: Self-pay | Admitting: *Deleted

## 2012-11-17 DIAGNOSIS — I5022 Chronic systolic (congestive) heart failure: Secondary | ICD-10-CM

## 2012-11-17 NOTE — Telephone Encounter (Signed)
Message copied by Noralee Space on Fri Nov 17, 2012  1:14 PM ------      Message from: Arvilla Meres R      Created: Thu Nov 16, 2012 11:01 PM       Hold lasix for a day. Stop spiro. Repeat on Monday. ------

## 2012-11-17 NOTE — Telephone Encounter (Signed)
Repeat labs 7/22

## 2012-11-22 ENCOUNTER — Other Ambulatory Visit (INDEPENDENT_AMBULATORY_CARE_PROVIDER_SITE_OTHER): Payer: Commercial Managed Care - PPO

## 2012-11-22 DIAGNOSIS — I5022 Chronic systolic (congestive) heart failure: Secondary | ICD-10-CM

## 2012-11-22 LAB — BASIC METABOLIC PANEL
BUN: 46 mg/dL — ABNORMAL HIGH (ref 6–23)
CO2: 24 mEq/L (ref 19–32)
Calcium: 9.7 mg/dL (ref 8.4–10.5)
Creatinine, Ser: 2.3 mg/dL — ABNORMAL HIGH (ref 0.4–1.2)
Glucose, Bld: 114 mg/dL — ABNORMAL HIGH (ref 70–99)

## 2012-11-23 ENCOUNTER — Telehealth (HOSPITAL_COMMUNITY): Payer: Self-pay | Admitting: *Deleted

## 2012-11-23 ENCOUNTER — Other Ambulatory Visit: Payer: Commercial Managed Care - PPO

## 2012-11-23 ENCOUNTER — Encounter: Payer: Self-pay | Admitting: Internal Medicine

## 2012-11-23 DIAGNOSIS — I5022 Chronic systolic (congestive) heart failure: Secondary | ICD-10-CM

## 2012-11-23 NOTE — Telephone Encounter (Signed)
Pt called to c/o increased SOB and weakness since Monday, she states she also has a cough and slight edema in feet, she states wt is up 3 lb today.  Patient also mentions that her vision has a halo and dots, discussed w/[name] Filbert Schilder, NP will have pt stop Digoxin and get dig level, will have pt take extra 40 mg of lasix today will call back if not feeling better

## 2012-11-24 ENCOUNTER — Other Ambulatory Visit: Payer: Commercial Managed Care - PPO

## 2012-11-24 DIAGNOSIS — I5022 Chronic systolic (congestive) heart failure: Secondary | ICD-10-CM

## 2012-12-01 ENCOUNTER — Ambulatory Visit: Payer: Commercial Managed Care - PPO | Admitting: Internal Medicine

## 2012-12-01 ENCOUNTER — Ambulatory Visit (INDEPENDENT_AMBULATORY_CARE_PROVIDER_SITE_OTHER): Payer: Commercial Managed Care - PPO | Admitting: Internal Medicine

## 2012-12-01 ENCOUNTER — Encounter: Payer: Self-pay | Admitting: *Deleted

## 2012-12-01 ENCOUNTER — Encounter: Payer: Self-pay | Admitting: Internal Medicine

## 2012-12-01 VITALS — BP 110/71 | HR 79 | Ht 63.0 in | Wt 170.6 lb

## 2012-12-01 DIAGNOSIS — I5022 Chronic systolic (congestive) heart failure: Secondary | ICD-10-CM

## 2012-12-01 DIAGNOSIS — I472 Ventricular tachycardia: Secondary | ICD-10-CM

## 2012-12-01 MED ORDER — AMIODARONE HCL 200 MG PO TABS: ORAL_TABLET | ORAL | Status: DC

## 2012-12-01 NOTE — Patient Instructions (Addendum)
Your physician wants you to follow-up in:3 months with Dr Ladona Ridgel    Your physician has recommended you make the following change in your medication:  1) Decrease Amiodarone to 400mg  in the am and 200mg  in the pm

## 2012-12-04 ENCOUNTER — Encounter: Payer: Self-pay | Admitting: Internal Medicine

## 2012-12-04 NOTE — Progress Notes (Signed)
HPI Kaitlin [name] returns today for followup.  She is a very pleasant 67 year old woman with a nonischemic cardiomyopathy, chronic systolic heart failure, and ventricular tachycardia. She has had no recurrent ventricular arrhythmias on fairly high dose amiodarone therapy. She does note some peripheral edema and problems with orthostasis. No Known Allergies   Current Outpatient Prescriptions  Medication Sig Dispense Refill  . acetaminophen (TYLENOL) 325 MG tablet Take 650 mg by mouth every 6 (six) hours as needed for pain.      [name] amiodarone (PACERONE) 200 MG tablet Take 400mg  q am and 200mg  q pm  120 tablet  3  . aspirin EC 81 MG tablet Take 81 mg by mouth daily.      . carvedilol (COREG) 25 MG tablet Take 1 tablet (25 mg total) by mouth 2 (two) times daily with a meal.  60 tablet  3  . Cyanocobalamin (B-12 PO) Take 1 tablet by mouth daily.      . furosemide (LASIX) 40 MG tablet Take 40 mg by mouth daily.      [name] lisinopril (PRINIVIL,ZESTRIL) 10 MG tablet Take 0.5 tablets (5 mg total) by mouth daily.  30 tablet  3  . simvastatin (ZOCOR) 40 MG tablet TAKE 1 TABLET AT BEDTIME  90 tablet  3   No current facility-administered medications for this visit.     Past Medical History  Diagnosis Date  . Paroxysmal supraventricular tachycardia   . Hypokalemia   . Chronic systolic heart failure     a. 05/6107 Echo: EF 15%, mod to sev antlat and inflat HK, inf AK, mild to mod MR, mildly/mod reduced RV fxn, Mod TR, PASP .  [name] History of DVT (deep vein thrombosis)   . Dyslipidemia   . HTN (hypertension)   . Nonischemic cardiomyopathy     a. 06/2012 Echo: EF 15%;  b. 06/2012 Cath: nl except luminal irregs in LAD.  [name] Ventricular tachycardia     a. s/p ICD  . Goiter   . Implantable cardiac defibrillator- Biotronik     Single chamber  . [name] (left bundle branch block)     intermittent    ROS:   All systems reviewed and negative except as noted in the HPI.   Past Surgical History  Procedure  Laterality Date  . None    . Cardiac defibrillator placement       Family History  Problem Relation Age of Onset  . Emphysema Mother   . Heart disease Mother   . Heart attack Father   . Emphysema Brother      History   Social History  . Marital Status: Single    Spouse Name: N/A    Number of Children: N/A  . Years of Education: N/A   Occupational History  . 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 Smoking status: Never Smoker   . Smokeless tobacco: Not on file  . Alcohol Use: No  . Drug Use: No  . Sexually Active: Not on file   Other Topics Concern  . Not on file   Social History Narrative  . No narrative on file     BP 110/71  Pulse 79  Ht 5\' 3"  (1.6 m)  Wt 170 lb 9.6 oz (77.384 kg)  BMI 30.23 kg/m2  Physical Exam:  Well appearing NAD HEENT: Unremarkable Neck:  No JVD, no thyromegally Lymphatics:  No adenopathy Back:  No CVA tenderness Lungs:  Clear HEART:  Regular rate rhythm, no  murmurs, no rubs, no clicks Abd:  soft, positive bowel sounds, no organomegally, no rebound, no guarding Ext:  2 plus pulses, no edema, no cyanosis, no clubbing Skin:  No rashes no nodules Neuro:  CN II through XII intact, motor grossly intact  EKG  DEVICE  Normal device function.  See PaceArt for details.   Assess/Plan:

## 2012-12-04 NOTE — Assessment & Plan Note (Signed)
Since initiation of high-dose amiodarone, the patient has maintained sinus rhythm with no sustained ventricular arrhythmias. Today we asked the patient to reduce her dose of amiodarone from [facility] her milligrams twice daily to 400 mg in the morning and 200 mg in the evening.

## 2012-12-04 NOTE — Assessment & Plan Note (Signed)
Her chronic systolic heart failure remains class III.  She will continue her current medical therapy. She will followup in our heart failure clinic.

## 2012-12-05 LAB — ICD DEVICE OBSERVATION
BATTERY VOLTAGE: 3.11 V
HV IMPEDENCE: 39 Ohm
RV LEAD AMPLITUDE: 11.9 mv
RV LEAD IMPEDENCE ICD: 619 Ohm
TZAT-0001FASTVT: 1
TZAT-0001SLOWVT: 1
TZAT-0004SLOWVT: 8
TZAT-0005SLOWVT: 85 pct
TZAT-0012SLOWVT: 200 ms
TZAT-0013FASTVT: 2
TZAT-0013SLOWVT: 6
TZAT-0018FASTVT: NEGATIVE
TZAT-0018SLOWVT: NEGATIVE
TZAT-0019FASTVT: 7.5 V
TZAT-0019SLOWVT: 7.5 V
TZON-0003FASTVT: 310 ms
TZON-0003SLOWVT: 360 ms
TZON-0004FASTVT: 30
TZON-0004SLOWVT: 40
TZON-0010FASTVT: 24 ms
TZST-0001FASTVT: 2
TZST-0001FASTVT: 3
TZST-0001FASTVT: 6
TZST-0001FASTVT: 8
TZST-0003FASTVT: 40 J
TZST-0003FASTVT: 40 J
TZST-0003FASTVT: 40 J
TZST-0003FASTVT: 40 J

## 2012-12-07 ENCOUNTER — Inpatient Hospital Stay (HOSPITAL_COMMUNITY)
Admission: RE | Admit: 2012-12-07 | Discharge: 2013-01-18 | DRG: 001 | Disposition: A | Discharge status: Home Health | Payer: Commercial Managed Care - PPO | Source: Ambulatory Visit | Attending: Cardiothoracic Surgery | Admitting: Cardiothoracic Surgery

## 2012-12-07 ENCOUNTER — Other Ambulatory Visit (HOSPITAL_COMMUNITY): Payer: Self-pay | Admitting: Anesthesiology

## 2012-12-07 ENCOUNTER — Encounter (HOSPITAL_COMMUNITY): Payer: Self-pay | Admitting: Cardiology

## 2012-12-07 ENCOUNTER — Encounter (HOSPITAL_COMMUNITY): Payer: Self-pay

## 2012-12-07 ENCOUNTER — Ambulatory Visit (HOSPITAL_BASED_OUTPATIENT_CLINIC_OR_DEPARTMENT_OTHER)
Admission: RE | Admit: 2012-12-07 | Discharge: 2012-12-07 | Disposition: A | Discharge status: Home / Self Care | Payer: Commercial Managed Care - PPO | Source: Ambulatory Visit | Attending: Internal Medicine | Admitting: Internal Medicine

## 2012-12-07 ENCOUNTER — Encounter (HOSPITAL_COMMUNITY): Payer: Self-pay | Admitting: Internal Medicine

## 2012-12-07 ENCOUNTER — Encounter (HOSPITAL_COMMUNITY): Payer: Self-pay | Admitting: Pharmacy Technician

## 2012-12-07 VITALS — BP 99/59 | HR 73 | Wt 169.8 lb

## 2012-12-07 DIAGNOSIS — R197 Diarrhea, unspecified: Secondary | ICD-10-CM | POA: Diagnosis not present

## 2012-12-07 DIAGNOSIS — I447 Left bundle-branch block, unspecified: Secondary | ICD-10-CM | POA: Diagnosis present

## 2012-12-07 DIAGNOSIS — I071 Rheumatic tricuspid insufficiency: Secondary | ICD-10-CM | POA: Diagnosis present

## 2012-12-07 DIAGNOSIS — I5023 Acute on chronic systolic (congestive) heart failure: Principal | ICD-10-CM | POA: Diagnosis not present

## 2012-12-07 DIAGNOSIS — R531 Weakness: Secondary | ICD-10-CM | POA: Diagnosis present

## 2012-12-07 DIAGNOSIS — I50810 Right heart failure, unspecified: Secondary | ICD-10-CM

## 2012-12-07 DIAGNOSIS — I472 Ventricular tachycardia, unspecified: Secondary | ICD-10-CM | POA: Insufficient documentation

## 2012-12-07 DIAGNOSIS — I5022 Chronic systolic (congestive) heart failure: Secondary | ICD-10-CM

## 2012-12-07 DIAGNOSIS — K5900 Constipation, unspecified: Secondary | ICD-10-CM | POA: Diagnosis not present

## 2012-12-07 DIAGNOSIS — E876 Hypokalemia: Secondary | ICD-10-CM | POA: Diagnosis not present

## 2012-12-07 DIAGNOSIS — I079 Rheumatic tricuspid valve disease, unspecified: Secondary | ICD-10-CM | POA: Diagnosis present

## 2012-12-07 DIAGNOSIS — F3289 Other specified depressive episodes: Secondary | ICD-10-CM | POA: Diagnosis not present

## 2012-12-07 DIAGNOSIS — I509 Heart failure, unspecified: Secondary | ICD-10-CM | POA: Diagnosis present

## 2012-12-07 DIAGNOSIS — N183 Chronic kidney disease, stage 3 unspecified: Secondary | ICD-10-CM | POA: Diagnosis not present

## 2012-12-07 DIAGNOSIS — D62 Acute posthemorrhagic anemia: Secondary | ICD-10-CM | POA: Diagnosis not present

## 2012-12-07 DIAGNOSIS — I4729 Other ventricular tachycardia: Secondary | ICD-10-CM | POA: Insufficient documentation

## 2012-12-07 DIAGNOSIS — E43 Unspecified severe protein-calorie malnutrition: Secondary | ICD-10-CM | POA: Diagnosis not present

## 2012-12-07 DIAGNOSIS — Z95810 Presence of automatic (implantable) cardiac defibrillator: Secondary | ICD-10-CM | POA: Insufficient documentation

## 2012-12-07 DIAGNOSIS — I059 Rheumatic mitral valve disease, unspecified: Secondary | ICD-10-CM | POA: Diagnosis present

## 2012-12-07 DIAGNOSIS — N179 Acute kidney failure, unspecified: Secondary | ICD-10-CM

## 2012-12-07 DIAGNOSIS — J189 Pneumonia, unspecified organism: Secondary | ICD-10-CM | POA: Diagnosis not present

## 2012-12-07 DIAGNOSIS — Z7901 Long term (current) use of anticoagulants: Secondary | ICD-10-CM

## 2012-12-07 DIAGNOSIS — R7309 Other abnormal glucose: Secondary | ICD-10-CM | POA: Diagnosis not present

## 2012-12-07 DIAGNOSIS — I129 Hypertensive chronic kidney disease with stage 1 through stage 4 chronic kidney disease, or unspecified chronic kidney disease: Secondary | ICD-10-CM | POA: Diagnosis present

## 2012-12-07 DIAGNOSIS — Z515 Encounter for palliative care: Secondary | ICD-10-CM

## 2012-12-07 DIAGNOSIS — K226 Gastro-esophageal laceration-hemorrhage syndrome: Secondary | ICD-10-CM | POA: Diagnosis not present

## 2012-12-07 DIAGNOSIS — N189 Chronic kidney disease, unspecified: Secondary | ICD-10-CM

## 2012-12-07 DIAGNOSIS — Z79899 Other long term (current) drug therapy: Secondary | ICD-10-CM

## 2012-12-07 DIAGNOSIS — E871 Hypo-osmolality and hyponatremia: Secondary | ICD-10-CM | POA: Diagnosis not present

## 2012-12-07 DIAGNOSIS — F329 Major depressive disorder, single episode, unspecified: Secondary | ICD-10-CM | POA: Diagnosis not present

## 2012-12-07 DIAGNOSIS — Z7982 Long term (current) use of aspirin: Secondary | ICD-10-CM

## 2012-12-07 DIAGNOSIS — I428 Other cardiomyopathies: Secondary | ICD-10-CM | POA: Diagnosis present

## 2012-12-07 DIAGNOSIS — Z86718 Personal history of other venous thrombosis and embolism: Secondary | ICD-10-CM

## 2012-12-07 DIAGNOSIS — E785 Hyperlipidemia, unspecified: Secondary | ICD-10-CM | POA: Diagnosis present

## 2012-12-07 DIAGNOSIS — J9600 Acute respiratory failure, unspecified whether with hypoxia or hypercapnia: Secondary | ICD-10-CM | POA: Diagnosis not present

## 2012-12-07 DIAGNOSIS — Z95811 Presence of heart assist device: Secondary | ICD-10-CM

## 2012-12-07 DIAGNOSIS — D72829 Elevated white blood cell count, unspecified: Secondary | ICD-10-CM | POA: Diagnosis not present

## 2012-12-07 DIAGNOSIS — R570 Cardiogenic shock: Secondary | ICD-10-CM | POA: Diagnosis present

## 2012-12-07 HISTORY — DX: Encounter for palliative care: Z51.5

## 2012-12-07 LAB — BASIC METABOLIC PANEL
BUN: 45 mg/dL — ABNORMAL HIGH (ref 6–23)
Calcium: 10.3 mg/dL (ref 8.4–10.5)
Creatinine, Ser: 2.12 mg/dL — ABNORMAL HIGH (ref 0.50–1.10)
GFR calc Af Amer: 27 mL/min — ABNORMAL LOW (ref >90)
GFR calc non Af Amer: 23 mL/min — ABNORMAL LOW (ref >90)
Glucose, Bld: 134 mg/dL — ABNORMAL HIGH (ref 70–99)

## 2012-12-07 LAB — CBC
HCT: 36.9 % (ref 36.0–46.0)
MCH: 31.9 pg (ref 26.0–34.0)
MCV: 94.1 fL (ref 78.0–100.0)
Platelets: 274 10*3/uL (ref 150–400)
RDW: 14.1 % (ref 11.5–15.5)
WBC: 9.7 10*3/uL (ref 4.0–10.5)

## 2012-12-07 NOTE — Addendum Note (Signed)
Encounter addended by: Aundria Rud, NP on: 12/07/2012  3:41 PM<BR>     Documentation filed: Patient Instructions Section, Orders

## 2012-12-07 NOTE — Progress Notes (Signed)
Patient ID: Kaitlin [name], female   DOB: 02/15/1946, 66 y.o.   MRN: [id number] Referring Physician: Primary Care: Primary Cardiologist: Kaitlin [name]  HPI: Kaitlin [name] is a 66 y/o woman with h/o severe HTN, [name], VT s/p Biotronik ICD (2009) and 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 Referred to HF Clinic by Kaitlin [name].    In 2009, she was found to have EF 45% in setting of severe HTN. Also found to have VT and underwent ICD placement. Between 2009 and 2014 had done very well. Was following with Kaitlin [name].  In 2/14, admitted with SVT and shock . EF was 15% by echo with moderate RV dysfunction. Underwent RHC/LHC with Kaitlin [name] which showed minimal CAD. RHC as below. Treated medically. Arranged for outpatient SVT ablation.    CPX 7/14 Peak VO2: 12.3 (63% predicted peak VO2) VE/VCO2 slope: 42.1 OUES: 1.03 Peak RER: 1.1 Ventilatory Threshold: 9.4 (48.1% predicted peak VO2) VE/MVV: 43.6% O2pulse: 8 (89% predicted O2pulse)  3/14  Hemodynamics (mmHg)  RA 10  RV 48/8  PA 42/16, mean 29  PCWP mean 14  LV 118/29  AO 122/82  Oxygen saturations:  PA 57%  AO 98%  Cardiac Output (Fick) 3.71  Cardiac Index (Fick) 2.0  PVR 4 WU  Cardiac Output (Thermo) 2.45  Cardiac Index (Thermo) 1.3   In 3/14 underwent EPS which showed SVT and VT. SVT ablated but couldn't get VT. Started on amiodarone and device reprogrammed.   On August 27 2012 came to ER for sustained palpitations with associated nausea and weakness. ICD interrogated with sustained VT which was classified as SVT and for which therapy was not delivered. She remained in tachycardia for 3 hours before it terminated. She has also been observed to have multiple episodes of VT which have been ATP terminated. Seen by Kaitlin [name] and carvedilol increased to 37.5 bid. Scheduled for f/u with Kaitlin [name] for consideration of repeat ablation.  Echo 09/05/12. EF 10% with moderate RV dysfunction. Referred to HF Clinic for further management.  10/09/12 Biotronik Device  Interrogated- 22 episodes VT successfully treated with ATP. On May 31 one episode V-Fib   ECHO 11/01/12 EF 10-15% RV moderately dilated  Labs (7/14): K+ 5.2, 2.5  Follow up:. At last visit started amio 400 bid for frequent VT. Since last visit has experienced problems with vision such as seeing halo and dots and stopped digoxin. Last visit also stopped Spiro and cut back lisinopril back to 5 mg daily. Saw Kaitlin [name] last week and he decreased Amiodarone to 400 mg am and 200 mg pm d/t patient has been in SR with no sustained ventricular arrhythmias. Reports feeling good in the am, but very fatigued and SOB in the afternoons. Still working. Persistent dry cough. Weight at home 168 and has been increasing, reports fluid pills not helping. Denies CP. Now has a handicap sticker.   Past Medical History  Diagnosis Date  . Paroxysmal supraventricular tachycardia   . Hypokalemia   . Chronic systolic heart failure     a. 06/2012 Echo: EF 15%, mod to sev antlat and inflat HK, inf AK, mild to mod MR, mildly/mod reduced RV fxn, Mod TR, PASP 46mmHg.  . History of DVT (deep vein thrombosis)   . Dyslipidemia   . HTN (hypertension)   . Nonischemic cardiomyopathy     a. 06/2012 Echo: EF 15%;  b. 06/2012 Cath: nl except luminal irregs in LAD.  . Ventricular tachycardia     a. s/p ICD  . Goiter   .   Implantable cardiac defibrillator- Biotronik     Single chamber  . [name] (left bundle branch block)     intermittent    Current Outpatient Prescriptions  Medication Sig Dispense Refill  . acetaminophen (TYLENOL) 325 MG tablet Take 650 mg by mouth every 6 (six) hours as needed for pain.      . amiodarone (PACERONE) 200 MG tablet Take 300 mg by mouth daily. Take 200 mg q am and 100mg q pm      . aspirin EC 81 MG tablet Take 81 mg by mouth daily.      . carvedilol (COREG) 25 MG tablet Take 1 tablet (25 mg total) by mouth 2 (two) times daily with a meal.  60 tablet  3  . furosemide (LASIX) 40 MG tablet Take 40 mg by  mouth daily.      . lisinopril (PRINIVIL,ZESTRIL) 10 MG tablet Take 0.5 tablets (5 mg total) by mouth daily.  30 tablet  3  . simvastatin (ZOCOR) 40 MG tablet TAKE 1 TABLET AT BEDTIME  90 tablet  3  . Cyanocobalamin (B-12 PO) Take 1 tablet by mouth daily.       No current facility-administered medications for this encounter.    No Known Allergies  History   Social History  . Marital Status: Single    Spouse Name: N/A    Number of Children: N/A  . Years of Education: N/A   Occupational History  . 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 Smoking status: Never Smoker   . Smokeless tobacco: Not on file  . Alcohol Use: No  . Drug Use: No  . Sexually Active: Not on file   Other Topics Concern  . 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 Lives in [place], not married. 2 daughters/           Family History  Problem Relation Age of Onset  . Emphysema Mother   . Heart disease Mother   . Heart attack Father   . Emphysema Brother    PHYSICAL EXAM: Filed Vitals:   12/07/12 1444  BP: 99/59  Pulse: 73  Weight: 169 lb 12.8 oz (77.021 kg)  SpO2: 100%  Last weight: 165 lbs  General:  Fatigued appearing. No respiratory difficulty Daughter present HEENT: normal Neck: supple. JVP to jaw with prominent CV waves Carotids 2+ bilat; no bruits. No lymphadenopathy or thryomegaly appreciated. Cor: PMI nonpalpable. Distant HS Regular rate & rhythm. 2/6 TR. +s3 Lungs: clear Abdomen: soft, nontender, nondistended. No hepatosplenomegaly. No bruits or masses. Good bowel sounds. Extremities: no cyanosis, clubbing, rash, tr-1+edema Neuro: alert & oriented x 3, cranial nerves grossly intact. moves all 4 extremities w/o difficulty. Affect pleasant.   ASSESSMENT:  1. Chronic systolic HF with biventricular HF EF 10% 2. VT s/p Biotronik ICD 3. Chronic renal failure  4. [name] 5. SVT s/p ablation     PLAN/DISCUSSION:  She continues to deteriorate from a functional perspective now NYHA IIIB. Renal function also getting worse and is suggestive of low output. Will need RHC tomorrow to further evaluate and will likely need inotropic support in anticipation of advanced therapies. If needs inotropes will need to watch for increasing ectopy. Given [name] may benefit from CRT upgrade but at this point it will likely not be sufficient support. Discussed at length with her and her daughter.   Larue Drawdy,MD 3:32 PM   

## 2012-12-07 NOTE — Patient Instructions (Addendum)
Stop lisinopril right now and do not take lasix tomorrow before Heart Catheterization.

## 2012-12-08 ENCOUNTER — Encounter (HOSPITAL_COMMUNITY): Payer: Self-pay | Admitting: *Deleted

## 2012-12-08 ENCOUNTER — Inpatient Hospital Stay (HOSPITAL_COMMUNITY): Payer: Commercial Managed Care - PPO

## 2012-12-08 ENCOUNTER — Encounter (HOSPITAL_COMMUNITY)
Admission: RE | Disposition: A | Discharge status: Home Health | Payer: Self-pay | Source: Ambulatory Visit | Attending: Cardiothoracic Surgery

## 2012-12-08 DIAGNOSIS — I509 Heart failure, unspecified: Secondary | ICD-10-CM

## 2012-12-08 DIAGNOSIS — I5022 Chronic systolic (congestive) heart failure: Secondary | ICD-10-CM

## 2012-12-08 HISTORY — PX: RIGHT HEART CATHETERIZATION: SHX5447

## 2012-12-08 LAB — CBC
MCH: 32 pg (ref 26.0–34.0)
MCV: 95 fL (ref 78.0–100.0)
Platelets: 226 10*3/uL (ref 150–400)
RBC: 3.41 MIL/uL — ABNORMAL LOW (ref 3.87–5.11)
RDW: 14.3 % (ref 11.5–15.5)

## 2012-12-08 LAB — POCT I-STAT 3, VENOUS BLOOD GAS (G3P V)
Acid-base deficit: 2 mmol/L (ref 0.0–2.0)
Bicarbonate: 24.5 mEq/L — ABNORMAL HIGH (ref 20.0–24.0)
Bicarbonate: 23.8 mEq/L (ref 20.0–24.0)
O2 Saturation: 62 %
TCO2: 25 mmol/L (ref 0–100)
TCO2: 25 mmol/L (ref 0–100)
pCO2, Ven: 41.3 mmHg — ABNORMAL LOW (ref 45.0–50.0)
pCO2, Ven: 42 mmHg — ABNORMAL LOW (ref 45.0–50.0)
pH, Ven: 7.361 — ABNORMAL HIGH (ref 7.250–7.300)
pH, Ven: 7.365 — ABNORMAL HIGH (ref 7.250–7.300)
pH, Ven: 7.356 — ABNORMAL HIGH (ref 7.250–7.300)
pO2, Ven: 29 mmHg — CL (ref 30.0–45.0)
pO2, Ven: 33 mmHg (ref 30.0–45.0)

## 2012-12-08 LAB — TYPE AND SCREEN
ABO/RH(D): A POS
Antibody Screen: NEGATIVE

## 2012-12-08 LAB — POCT I-STAT 3, ART BLOOD GAS (G3+)
Acid-base deficit: 1 mmol/L (ref 0.0–2.0)
O2 Saturation: 96 %

## 2012-12-08 SURGERY — RIGHT HEART CATH
Anesthesia: LOCAL

## 2012-12-08 MED ORDER — DOBUTAMINE IN D5W 4-5 MG/ML-% IV SOLN
INTRAVENOUS | Status: AC
  Filled 2012-12-08: qty 250

## 2012-12-08 MED ORDER — SODIUM CHLORIDE 0.9 % IJ SOLN: 3.0000 mL | INTRAMUSCULAR | Status: DC | PRN

## 2012-12-08 MED ORDER — ASPIRIN 81 MG PO CHEW
81.0000 mg | CHEWABLE_TABLET | Freq: Every day | ORAL | Status: DC
  Administered 2012-12-08 – 2012-12-17 (×10): 81 mg via ORAL
  Filled 2012-12-08 (×10): qty 1

## 2012-12-08 MED ORDER — SODIUM CHLORIDE 0.9 % IV SOLN
250.0000 mL | INTRAVENOUS | Status: DC | PRN
  Administered 2012-12-12: 250 mL via INTRAVENOUS

## 2012-12-08 MED ORDER — SODIUM CHLORIDE 0.9 % IV SOLN
250.0000 mL | INTRAVENOUS | Status: DC | PRN
  Administered 2012-12-08: 09:00:00 via INTRAVENOUS

## 2012-12-08 MED ORDER — MIDAZOLAM HCL 2 MG/2ML IJ SOLN
INTRAMUSCULAR | Status: AC
  Filled 2012-12-08: qty 2

## 2012-12-08 MED ORDER — ACETAMINOPHEN 325 MG PO TABS
650.0000 mg | ORAL_TABLET | ORAL | Status: DC | PRN
  Administered 2012-12-08 – 2012-12-14 (×6): 650 mg via ORAL
  Filled 2012-12-08 (×5): qty 2

## 2012-12-08 MED ORDER — DOBUTAMINE IN D5W 4-5 MG/ML-% IV SOLN
1.0000 ug/kg/min | INTRAVENOUS | Status: DC
  Administered 2012-12-08: 2.5 ug/kg/min via INTRAVENOUS
  Administered 2012-12-09 – 2013-01-02 (×10): 4 ug/kg/min via INTRAVENOUS
  Administered 2013-01-04: 3 ug/kg/min via INTRAVENOUS
  Administered 2013-01-06 – 2013-01-09 (×2): 4 ug/kg/min via INTRAVENOUS
  Administered 2013-01-11: 2 ug/kg/min via INTRAVENOUS
  Administered 2013-01-11: 3 ug/kg/min via INTRAVENOUS
  Filled 2012-12-08 (×14): qty 250

## 2012-12-08 MED ORDER — LIDOCAINE HCL (PF) 1 % IJ SOLN
INTRAMUSCULAR | Status: AC
  Filled 2012-12-08: qty 30

## 2012-12-08 MED ORDER — SODIUM CHLORIDE 0.9 % IJ SOLN
10.0000 mL | INTRAMUSCULAR | Status: DC | PRN
  Administered 2012-12-09 – 2012-12-13 (×2): 10 mL
  Administered 2012-12-14: 20 mL
  Administered 2012-12-17 – 2013-01-09 (×2): 10 mL

## 2012-12-08 MED ORDER — SODIUM CHLORIDE 0.9 % IJ SOLN
3.0000 mL | Freq: Two times a day (BID) | INTRAMUSCULAR | Status: DC
  Administered 2012-12-08 – 2012-12-19 (×12): 3 mL via INTRAVENOUS

## 2012-12-08 MED ORDER — HEPARIN (PORCINE) IN NACL 2-0.9 UNIT/ML-% IJ SOLN
INTRAMUSCULAR | Status: AC
  Filled 2012-12-08: qty 500

## 2012-12-08 MED ORDER — AMIODARONE HCL 200 MG PO TABS
200.0000 mg | ORAL_TABLET | Freq: Two times a day (BID) | ORAL | Status: DC
  Administered 2012-12-08 – 2012-12-17 (×19): 200 mg via ORAL
  Filled 2012-12-08 (×21): qty 1

## 2012-12-08 MED ORDER — ONDANSETRON HCL 4 MG/2ML IJ SOLN
4.0000 mg | Freq: Four times a day (QID) | INTRAMUSCULAR | Status: DC | PRN
  Administered 2012-12-24 – 2012-12-31 (×3): 4 mg via INTRAVENOUS

## 2012-12-08 MED ORDER — MILRINONE IN DEXTROSE 20 MG/100ML IV SOLN: 0.1250 ug/kg/min | INTRAVENOUS | Status: DC

## 2012-12-08 MED ORDER — HEPARIN SODIUM (PORCINE) 5000 UNIT/ML IJ SOLN
5000.0000 [IU] | Freq: Three times a day (TID) | INTRAMUSCULAR | Status: DC
  Administered 2012-12-08 – 2012-12-17 (×28): 5000 [IU] via SUBCUTANEOUS
  Filled 2012-12-08 (×34): qty 1

## 2012-12-08 MED ORDER — MILRINONE IN DEXTROSE 20 MG/100ML IV SOLN
0.1250 ug/kg/min | INTRAVENOUS | Status: DC
  Filled 2012-12-08: qty 100

## 2012-12-08 MED ORDER — SODIUM CHLORIDE 0.9 % IJ SOLN: 3.0000 mL | Freq: Two times a day (BID) | INTRAMUSCULAR | Status: DC

## 2012-12-08 MED ORDER — SIMVASTATIN 40 MG PO TABS
40.0000 mg | ORAL_TABLET | Freq: Every day | ORAL | Status: DC
  Administered 2012-12-08: 40 mg via ORAL
  Filled 2012-12-08 (×2): qty 1

## 2012-12-08 NOTE — H&P (View-Only) (Signed)
Patient ID: Kaitlin [name], female   DOB: 11/16/1945, 67 y.o.   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 Referred to HF Clinic by Kaitlin [name].    In 2009, she was found to have EF 45% in setting of severe HTN. Also found to have VT and underwent ICD placement. Between 2009 and 2014 had done very well. Was following with Kaitlin [name].  In 2/14, admitted with SVT and shock . EF was 15% by echo with moderate RV dysfunction. Underwent RHC/LHC with Kaitlin [name] which showed minimal CAD. RHC as below. Treated medically. Arranged for outpatient SVT ablation.    CPX 7/14 Peak VO2: 12.3 (63% predicted peak VO2) VE/VCO2 slope: 42.1 OUES: 1.03 Peak RER: 1.1 Ventilatory Threshold: 9.4 (48.1% predicted peak VO2) VE/MVV: 43.6% O2pulse: 8 (89% predicted O2pulse)  3/14  Hemodynamics (mmHg)  RA 10  RV 48/8  PA 42/16, mean 29  PCWP mean 14  LV 118/29  AO 122/82  Oxygen saturations:  PA 57%  AO 98%  Cardiac Output (Fick) 3.71  Cardiac Index (Fick) 2.0  PVR 4 WU  Cardiac Output (Thermo) 2.45  Cardiac Index (Thermo) 1.3   In 3/14 underwent EPS which showed SVT and VT. SVT ablated but couldn't get VT. Started on amiodarone and device reprogrammed.   On August 27 2012 came to ER for sustained palpitations with associated nausea and weakness. ICD interrogated with sustained VT which was classified as SVT and for which therapy was not delivered. She remained in tachycardia for 3 hours before it terminated. She has also been observed to have multiple episodes of VT which have been ATP terminated. Seen by Kaitlin [name] and carvedilol increased to 37.5 bid. Scheduled for f/u with Kaitlin [name] for consideration of repeat ablation.  Echo 09/05/12. EF 10% with moderate RV dysfunction. Referred to HF Clinic for further management.  10/09/12 Biotronik Device  Interrogated- 22 episodes VT successfully treated with ATP. On May 31 one episode V-Fib   ECHO 11/01/12 EF 10-15% RV moderately dilated  Labs (7/14): K+ 5.2, 2.5  Follow up:. At last visit started amio 400 bid for frequent VT. Since last visit has experienced problems with vision such as seeing halo and dots and stopped digoxin. Last visit also stopped Cleda Daub and cut back lisinopril back to 5 mg daily. Saw Kaitlin [name] last week and he decreased Amiodarone to 400 mg am and 200 mg pm d/t patient has been in SR with no sustained ventricular arrhythmias. Reports feeling good in the am, but very fatigued and SOB in the afternoons. Still working. Persistent dry cough. Weight at home 168 and has been increasing, reports fluid pills not helping. Denies CP. Now has a handicap sticker.   Past Medical History  Diagnosis Date  . Paroxysmal supraventricular tachycardia   . Hypokalemia   . Chronic systolic heart failure     a. 08/6960 Echo: EF 15%, mod to sev antlat and inflat HK, inf AK, mild to mod MR, mildly/mod reduced RV fxn, Mod TR, PASP .  [name] History of DVT (deep vein thrombosis)   . Dyslipidemia   . HTN (hypertension)   . Nonischemic cardiomyopathy     a. 06/2012 Echo: EF 15%;  b. 06/2012 Cath: nl except luminal irregs in LAD.  [name] Ventricular tachycardia     a. s/p ICD  . Goiter   .  Implantable cardiac defibrillator- Biotronik     Single chamber  . [name] (left bundle branch block)     intermittent    Current Outpatient Prescriptions  Medication Sig Dispense Refill  . acetaminophen (TYLENOL) 325 MG tablet Take 650 mg by mouth every 6 (six) hours as needed for pain.      [name] amiodarone (PACERONE) 200 MG tablet Take 300 mg by mouth daily. Take 200 mg q am and 100mg  q pm      . aspirin EC 81 MG tablet Take 81 mg by mouth daily.      . carvedilol (COREG) 25 MG tablet Take 1 tablet (25 mg total) by mouth 2 (two) times daily with a meal.  60 tablet  3  . furosemide (LASIX) 40 MG tablet Take 40 mg by  mouth daily.      [name] lisinopril (PRINIVIL,ZESTRIL) 10 MG tablet Take 0.5 tablets (5 mg total) by mouth daily.  30 tablet  3  . simvastatin (ZOCOR) 40 MG tablet TAKE 1 TABLET AT BEDTIME  90 tablet  3  . Cyanocobalamin (B-12 PO) Take 1 tablet by mouth daily.       No current facility-administered medications for this encounter.    No Known Allergies  History   Social History  . Marital Status: Single    Spouse Name: N/A    Number of Children: N/A  . Years of Education: N/A   Occupational History  . 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 Smoking status: Never Smoker   . Smokeless tobacco: Not on file  . Alcohol Use: No  . Drug Use: No  . Sexually Active: Not on file   Other Topics Concern  . 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 Lives in [place], not married. 2 daughters/           Family History  Problem Relation Age of Onset  . Emphysema Mother   . Heart disease Mother   . Heart attack Father   . Emphysema Brother    PHYSICAL EXAM: Filed Vitals:   12/07/12 1444  BP: 99/59  Pulse: 73  Weight: 169 lb 12.8 oz (77.021 kg)  SpO2: 100%  Last weight: 165 lbs  General:  Fatigued appearing. No respiratory difficulty Daughter present HEENT: normal Neck: supple. JVP to jaw with prominent CV waves Carotids 2+ bilat; no bruits. No lymphadenopathy or thryomegaly appreciated. Cor: PMI nonpalpable. Distant HS Regular rate & rhythm. 2/6 TR. +s3 Lungs: clear Abdomen: soft, nontender, nondistended. No hepatosplenomegaly. No bruits or masses. Good bowel sounds. Extremities: no cyanosis, clubbing, rash, tr-1+edema Neuro: alert & oriented x 3, cranial nerves grossly intact. moves all 4 extremities w/o difficulty. Affect pleasant.   ASSESSMENT:  1. Chronic systolic HF with biventricular HF EF 10% 2. VT s/p Biotronik ICD 3. Chronic renal failure  4. [name] 5. SVT s/p ablation     PLAN/DISCUSSION:  She continues to deteriorate from a functional perspective now NYHA IIIB. Renal function also getting worse and is suggestive of low output. Will need RHC tomorrow to further evaluate and will likely need inotropic support in anticipation of advanced therapies. If needs inotropes will need to watch for increasing ectopy. Given [name] may benefit from CRT upgrade but at this point it will likely not be sufficient support. Discussed at length with her and her daughter.   [name] 3:32 PM

## 2012-12-08 NOTE — Interval H&P Note (Signed)
History and Physical Interval Note:  12/08/2012 11:50 AM  Kaitlin [name]  has presented today for surgery, with the diagnosis of CHF  The various methods of treatment have been discussed with the patient and family. After consideration of risks, benefits and other options for treatment, the patient has consented to  Procedure(s): RIGHT HEART CATH (N/A) as a surgical intervention .  The patient's history has been reviewed, patient examined, no change in status, stable for surgery.  I have reviewed the patient's chart and labs.  Questions were answered to the patient's satisfaction.     Daniel Bensimhon

## 2012-12-08 NOTE — Care Management Note (Signed)
Page 1 of 2   01/18/2013     3:42:57 PM   CARE MANAGEMENT NOTE 01/18/2013  Patient:  Kaitlin [name], Kaitlin [name]   Account Number:  [id number]  Date Initiated:  12/08/2012  Documentation initiated by:  Junius Creamer  Subjective/Objective Assessment:   adm w heart failure     Action/Plan:   lives alone, supp da's, pcp dr Earvin [place]   Anticipated DC Date:  01/18/2013   Anticipated DC Plan:  HOME W HOME HEALTH SERVICES      DC Planning Services  CM consult      PAC Choice  DURABLE MEDICAL EQUIPMENT  HOME HEALTH   Choice offered to / List presented to:  C-1 Patient   DME arranged  WALKER - ROLLING  BEDSIDE COMMODE      DME agency  [facility]     [facility] arranged  HH-1 RN  HH-10 DISEASE MANAGEMENT  HH-2 PT  HH-3 OT      HH agency  [facility]   Status of service:  Completed, signed off Medicare Important Message given?   (If response is "NO", the following Medicare IM given date fields will be blank) Date Medicare IM given:   Date Additional Medicare IM given:    Discharge Disposition:  HOME W HOME HEALTH SERVICES  Per UR Regulation:  Reviewed for med. necessity/level of care/duration of stay  If discussed at Long Length of Stay Meetings, dates discussed:   12/14/2012  12/19/2012  12/21/2012  12/26/2012  12/28/2012  01/02/2013  01/04/2013  01/09/2013  01/11/2013  01/16/2013  01/18/2013    Comments:  01/18/13 Gracen Southwell,RN,BSN PT FOR DC HOME TODAY WITH FAMILY.  [facility] AWARE OF DC DATE. START OF CARE W/I 24H OF DC DATE.  01/17/13 Nyshawn Gowdy,RN,BSN [phone] SPOKE WITH DAUGHTER, ANTOINETTE, BY PHONE AND REVIEWED HH AND DME ARRANGEMENTS, ANSWERED QUESTIONS, ETC.  01/16/13 Kimisha Eunice,RN,BSN [phone] MET WITH PT, DAUGHTER AND SON IN [place] TO FINALIZE DC PLANS. PT PLANS TO DC TO HER HOME AND DAUGHTERS PLAN TO STAY WITH HER AT DC.  HOME CARE TO BE PROVIDED BY [facility] FOR RN, PT, AND OT.  PT REQUESTS ROLLATOR WALKER AND [facility] FOR HOME.  WILL ARRANGE DME.  PT  EXCITED ABOUT DC HOME; DENIES ANY OTHER NEEDS FOR HOME.  01/12/13 Breindy Meadow,RN,BSN [phone] PT MAKING GOOD PROGRESS THIS WEEK.  DAUGHTER BEING EDUCATED ON STERILE DRESSING CHANGES AND DOING WELL.  CIR WILL NOT ADMIT, AS THEY ARE NOT TAKING LVAD PATIENTS AT THIS TIME.  01-08-13 11:40am [name], RNBSN [phone] Verified patients insurance on both 8-26 and 9-8 - both state [employer] primary.  CIR looking at - remains on dobutamine drip.  12-29-12 2:40pm [name], RNBSN [phone] Sternum closed.  Remains on vent, pressors, amio and lasix drip.  12/27/12 0830 Henrietta Mayo RN MSN BSN CCM RVAD removed 8/25, wound VAC placed.  For possible sternal closure today.  12/22/12 1104 Verdis Prime RN MSN BSN CCM Tentative plan is RVAD removal on 8/25.  12/19/12 1540 Henrietta Mayo RN MSN BSN CCM Transferred to 2300 s/p LVAD implant with TVR and RVAD.  On vent with multiple gtts.  [facility] also following.  8/12  1133a debbie dowell rn,bsn molly w lvad program working on prior [facility] for lvad. found phone # [phone] passcode [phone] left on her vm so she can get prior approval.  8/8 1545 debbie dowell rn,bsn amy w chf clinic states pt will be on home milrinone. spoke  w pt and no pref to hhc agencies. pt has [facility] which [facility] contract w [facility]. ref to pam w [facility] for home iv milrinone. poss dc over weekend and [facility] will be ready.

## 2012-12-08 NOTE — Progress Notes (Signed)
Peripherally Inserted Central Catheter/Midline Placement  The IV Nurse has discussed with the patient and/or persons authorized to consent for the patient, the purpose of this procedure and the potential benefits and risks involved with this procedure.  The benefits include less needle sticks, lab draws from the catheter and patient may be discharged home with the catheter.  Risks include, but not limited to, infection, bleeding, blood clot (thrombus formation), and puncture of an artery; nerve damage and irregular heat beat.  Alternatives to this procedure were also discussed.  PICC/Midline Placement Documentation  PICC Triple Lumen 12/08/12 PICC Right Brachial (Active)       Christeen Douglas 12/08/2012, 9:06 PM

## 2012-12-08 NOTE — Progress Notes (Signed)
IV team to come for PICC line insertion.

## 2012-12-08 NOTE — Progress Notes (Signed)
Staff nurse contacted regarding SBP 60-70s.   Discussed with Dr Gala Romney.   Instructed to cut back Milrinone to 0.125 mcg.   Kaitlin [name] 3:55 PM

## 2012-12-08 NOTE — Progress Notes (Signed)
ADVANCED HF H&P  Patient ID: Kaitlin [name], female   DOB: 1945/07/29, 67 y.o.   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 Referred to HF Clinic by Dr. Ladona Ridgel.    In 2009, she was found to have EF 45% in setting of severe HTN. Also found to have VT and underwent ICD placement. Between 2009 and 2014 had done very well. Was following with Dr. Antoine Poche.  In 2/14, admitted with SVT and shock . EF was 15% by echo with moderate RV dysfunction. Underwent RHC/LHC with Dr. Shirlee Latch which showed minimal CAD. RHC as below. Treated medically. Arranged for outpatient SVT ablation.   Over last few months with worsening HF symptoms. Seen in clinic yesterday with NYHA IIIb-IV symptoms and rising creatinine. Brought in today for RHC. Findings c/w low output physiology. (see report)  CPX 7/14 Peak VO2: 12.3 (63% predicted peak VO2) VE/VCO2 slope: 42.1 OUES: 1.03 Peak RER: 1.1 Ventilatory Threshold: 9.4 (48.1% predicted peak VO2) VE/MVV: 43.6% O2pulse: 8 (89% predicted O2pulse)    Past Medical History  Diagnosis Date  . Paroxysmal supraventricular tachycardia   . Hypokalemia   . Chronic systolic heart failure     a. 0/6657 Echo: EF 15%, mod to sev antlat and inflat HK, inf AK, mild to mod MR, mildly/mod reduced RV fxn, Mod TR, PASP .  [name] History of DVT (deep vein thrombosis)   . Dyslipidemia   . HTN (hypertension)   . Nonischemic cardiomyopathy     a. 06/2012 Echo: EF 15%;  b. 06/2012 Cath: nl except luminal irregs in LAD.  [name] Ventricular tachycardia     a. s/p ICD  . Goiter   . Implantable cardiac defibrillator- Biotronik     Single chamber  . [name] (left bundle branch block)     intermittent    Current Facility-Administered Medications  Medication Dose Route Frequency Provider Last Rate Last Dose  . 0.9 %  sodium chloride infusion   250 mL Intravenous PRN Dolores Patty, MD      . acetaminophen (TYLENOL) tablet 650 mg  650 mg Oral Q4H PRN Dolores Patty, MD      . amiodarone (PACERONE) tablet 200 mg  200 mg Oral BID [name] D [name], NP   200 mg at 12/08/12 1636  . aspirin chewable tablet 81 mg  81 mg Oral Daily [name] D [name], NP   81 mg at 12/08/12 1636  . heparin injection 5,000 Units  5,000 Units Subcutaneous Q8H Dolores Patty, MD   5,000 Units at 12/08/12 1637  . milrinone (PRIMACOR) infusion 200 mcg/mL (0.2 mg/ml)  0.125 mcg/kg/min Intravenous Continuous [name] D [name], NP 2.9 mL/hr at 12/08/12 1600 0.125 mcg/kg/min at 12/08/12 1600  . ondansetron (ZOFRAN) injection 4 mg  4 mg Intravenous Q6H PRN Dolores Patty, MD      . simvastatin (ZOCOR) tablet 40 mg  40 mg Oral q1800 [name] D [name], NP      . sodium chloride 0.9 % injection 3 mL  3 mL Intravenous Q12H Bevelyn Buckles Delmon Andrada, MD      . sodium chloride 0.9 % injection 3 mL  3 mL Intravenous PRN Dolores Patty, MD        No Known Allergies  History   Social History  . Marital Status: Single    Spouse Name: N/A  Number of Children: N/A  . Years of Education: N/A   Occupational History  . 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 Smoking status: Never Smoker   . Smokeless tobacco: Not on file  . Alcohol Use: No  . Drug Use: No  . Sexually Active: Not on file   Other Topics Concern  . 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 Lives in [place], not married. 2 daughters/           Family History  Problem Relation Age of Onset  . Emphysema Mother   . Heart disease Mother   . Heart attack Father   . Emphysema Brother    PHYSICAL EXAM: Filed Vitals:   12/08/12 1554 12/08/12 1600 12/08/12 1615 12/08/12 1630  BP: 89/38 86/36 89/45  92/50  Pulse: 72 31 40 79  Temp: 98.7 F (37.1 C)     TempSrc: Oral     Resp:  15 13 15   Height: 5\' 3"  (1.6 m)     Weight: 76.3 kg  (168 lb 3.4 oz)     SpO2: 100% 99% 100% 100%  Last weight: 165 lbs  General:  Fatigued appearing. No respiratory difficulty Daughter present HEENT: normal Neck: supple. JVP to jaw with prominent CV waves Carotids 2+ bilat; no bruits. No lymphadenopathy or thryomegaly appreciated. Cor: PMI nonpalpable. Distant HS Regular rate & rhythm. 2/6 TR. +s3 Lungs: clear Abdomen: soft, nontender, nondistended. No hepatosplenomegaly. No bruits or masses. Good bowel sounds. Extremities: no cyanosis, clubbing, rash, tr-1+edema Neuro: alert & oriented x 3, cranial nerves grossly intact. moves all 4 extremities w/o difficulty. Affect pleasant.   ASSESSMENT:  1. Chronic systolic HF with biventricular HF EF 10%  2. Cardiogenic shock 3. VT s/p Biotronik ICD 4. Chronic renal failure  5. [name] 6. SVT s/p ablation    PLAN/DISCUSSION:  Admit for PICC placement and milrinone initiation. Will need repeat echo to assess RV to see if she is VAD candidate. Will need to begin transplant w/u.   CRT-D upgrade is an option but I don't think that will be sufficient given how bad her HF is currently.   [name] 4:46 PM

## 2012-12-08 NOTE — Progress Notes (Signed)
   Unable to tolerate milrinone due to low BP with systolics in the 70s. Will switch to dobutamine 2.5. Watch closely for increasing ventricular ectopy.   Yavuz Kirby,MD 5:30 PM

## 2012-12-08 NOTE — CV Procedure (Signed)
Cardiac Cath Procedure Note:  Indication:  HF  Procedures performed:  1) Right heart catheterization with milrinone initiation  Description of procedure:   The risks and indication of the procedure were explained. Consent was signed and placed on the chart. An appropriate timeout was taken prior to the procedure. The right groin was prepped and draped in the routine sterile fashion and anesthetized with 1% local lidocaine.   A 7 FR venous sheath was placed in the right femoral vein using a modified Seldinger technique. A standard Swan-Ganz catheter was used for the procedure. After the pressures were obtained, we initiated IV milrinone for cardiogenic shock physiology. Milrinone was titrated as below and repeat RHC numbers were obtained at each dosing level.  Complications: None apparent.  Findings:  RA =  13 RV =  27/7/12 PA =  29/14 (20) PCW = 8  Thermo cardiac output/index = 2.1/1.2 Fick CO/CI = 4.4/2.4 Fick CO/CI (with directly measured O2 consumption) = 2.4/1.4 PVR = 5.0 Woods (Fick) FA sat = 96% PA sat = 54%, 61% SVC sat 64% Noninvasive BP =  104/64 (76)  After 15 minutes on milrinone 0.25 mcg/kg/min  PA 37/11 (21) PCW = 9  Thermo CO/CI = 2.5/1.4  Milrinone 0.375 PA 32/12 (20) PCW = 9 Thermo CO/CI = 2.8/1.5  Assessment:  1) Relatively filling pressures except for high RA pressure 2) Low cardiac output c/w cardiogenic shock 3) No evidence intracardiac shunt 4) Moderate response to milrinone  Plan/Discussion:  There is a marked discrepancy between thermodilution and Fick cardiac outputs. However when O2 consumption is measured directly with metabolic cart Fick output very similar to thermodilution and reflects cardiogenic shock which is consistent with her clinical picture.   Will place PICC and initiate home milrinone. Consider bringing back for repeat RHC on Monday to reassess full response to milrinone.   Daniel Bensimhon 11:51 AM

## 2012-12-09 DIAGNOSIS — I472 Ventricular tachycardia, unspecified: Secondary | ICD-10-CM

## 2012-12-09 LAB — BASIC METABOLIC PANEL
GFR calc Af Amer: 35 mL/min — ABNORMAL LOW (ref >90)
GFR calc non Af Amer: 30 mL/min — ABNORMAL LOW (ref >90)
Glucose, Bld: 101 mg/dL — ABNORMAL HIGH (ref 70–99)
Potassium: 3.9 mEq/L (ref 3.5–5.1)
Sodium: 141 mEq/L (ref 135–145)

## 2012-12-09 LAB — CARBOXYHEMOGLOBIN
Carboxyhemoglobin: 1.2 % (ref 0.5–1.5)
O2 Saturation: 57.8 %

## 2012-12-09 MED ORDER — TORSEMIDE 20 MG PO TABS
20.0000 mg | ORAL_TABLET | Freq: Every day | ORAL | Status: DC
  Administered 2012-12-09 – 2012-12-10 (×2): 20 mg via ORAL
  Filled 2012-12-09 (×2): qty 1

## 2012-12-09 MED ORDER — POTASSIUM CHLORIDE ER 10 MEQ PO TBCR
20.0000 meq | EXTENDED_RELEASE_TABLET | Freq: Every day | ORAL | Status: DC
  Administered 2012-12-09 – 2012-12-10 (×2): 20 meq via ORAL
  Filled 2012-12-09 (×2): qty 2

## 2012-12-09 MED ORDER — SODIUM CHLORIDE 0.9 % IV SOLN
INTRAVENOUS | Status: DC
  Administered 2012-12-25 – 2012-12-27 (×2): via INTRAVENOUS
  Administered 2012-12-27: 20 mL/h via INTRAVENOUS
  Administered 2012-12-28: 06:00:00 via INTRAVENOUS
  Administered 2012-12-29: 20 mL/h via INTRAVENOUS
  Administered 2012-12-29: 04:00:00 via INTRAVENOUS
  Administered 2012-12-30: 20 mL/h via INTRAVENOUS
  Administered 2013-01-02 – 2013-01-08 (×4): via INTRAVENOUS

## 2012-12-09 MED ORDER — ATORVASTATIN CALCIUM 20 MG PO TABS
20.0000 mg | ORAL_TABLET | Freq: Every day | ORAL | Status: DC
  Administered 2012-12-09 – 2012-12-20 (×11): 20 mg via ORAL
  Filled 2012-12-09 (×13): qty 1

## 2012-12-09 NOTE — Progress Notes (Signed)
CARDIAC REHAB PHASE I   PRE:  Rate/Rhythm: 76 SR  BP:  Supine:   Sitting: 112/65  Standing:    SaO2: 100% RA  MODE:  Ambulation: 350 ft   POST:  Rate/Rhythm: 100 ST  BP:  Supine:   Sitting: 104/62  Standing:    SaO2: 100% RA  1111-1150 Very pleasant, tolerated ambulation well without c/o, denies SOB, VSS. To chair after ambulation with legs elevated, encouraged to walk again with nursing staff.   Cristy Hilts, MS, ACSM CES

## 2012-12-09 NOTE — Progress Notes (Signed)
ADVANCED HF Rounding Note   Kaitlin [name] is a 67 y/o woman with h/o severe HTN, [name], VT s/p Biotronik ICD (2009) and 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 EF 15-25% with mild to moderate RV dysfunction   CPX 7/14 Peak VO2: 12.3 (63% predicted peak VO2) VE/VCO2 slope: 42.1 OUES: 1.03 Peak RER: 1.1 Ventilatory Threshold: 9.4 (48.1% predicted peak VO2) VE/MVV: 43.6% O2pulse: 8 (89% predicted O2pulse)  Underwent cath yesterday with low output physiology. Unable to torate milrinone due to hypotension. Switched to dobutamine. Tolerating well. Renal function improving. PICC in place.   Past Medical History  Diagnosis Date  . Paroxysmal supraventricular tachycardia   . Hypokalemia   . Chronic systolic heart failure     a. 08/979 Echo: EF 15%, mod to sev antlat and inflat HK, inf AK, mild to mod MR, mildly/mod reduced RV fxn, Mod TR, PASP .  [name] History of DVT (deep vein thrombosis)   . Dyslipidemia   . HTN (hypertension)   . Nonischemic cardiomyopathy     a. 06/2012 Echo: EF 15%;  b. 06/2012 Cath: nl except luminal irregs in LAD.  [name] Ventricular tachycardia     a. s/p ICD  . Goiter   . Implantable cardiac defibrillator- Biotronik     Single chamber  . [name] (left bundle branch block)     intermittent    Current Facility-Administered Medications  Medication Dose Route Frequency Provider Last Rate Last Dose  . 0.9 %  sodium chloride infusion  250 mL Intravenous PRN Dolores Patty, MD      . acetaminophen (TYLENOL) tablet 650 mg  650 mg Oral Q4H PRN Dolores Patty, MD   650 mg at 12/08/12 1745  . amiodarone (PACERONE) tablet 200 mg  200 mg Oral BID [name] D [name], NP   200 mg at 12/09/12 1009  . aspirin chewable tablet 81 mg  81 mg Oral Daily [name] D [name], NP   81 mg at 12/09/12 1010  . DOBUTamine (DOBUTREX) infusion 4000 mcg/mL  2.5 mcg/kg/min Intravenous Titrated Dolores Patty, MD 2.9 mL/hr at 12/08/12 2000 2.5 mcg/kg/min at 12/08/12 2000  . heparin injection 5,000 Units  5,000 Units  Subcutaneous Q8H Dolores Patty, MD   5,000 Units at 12/09/12 0654  . ondansetron (ZOFRAN) injection 4 mg  4 mg Intravenous Q6H PRN Dolores Patty, MD      . simvastatin (ZOCOR) tablet 40 mg  40 mg Oral q1800 [name] D [name], NP   40 mg at 12/08/12 2244  . sodium chloride 0.9 % injection 10-40 mL  10-40 mL Intracatheter PRN Dolores Patty, MD      . sodium chloride 0.9 % injection 3 mL  3 mL Intravenous Q12H Dolores Patty, MD   3 mL at 12/09/12 1010  . sodium chloride 0.9 % injection 3 mL  3 mL Intravenous PRN Dolores Patty, MD        PHYSICAL EXAM: Filed Vitals:   12/09/12 0425 12/09/12 0700 12/09/12 0733 12/09/12 0800  BP: 117/75 110/79 124/69 116/51  Pulse: 70 76  66  Temp: 98.5 F (36.9 C)  97.9 F (36.6 C)   TempSrc: Oral  Oral   Resp: 22 18 16 21   Height:      Weight:      SpO2: 100% 100% 100% 100%    General:  Fatigued appearing. No respiratory difficulty HEENT: normal Neck: supple. JVP 10 Carotids 2+ bilat; no bruits. No lymphadenopathy or thryomegaly appreciated. Cor: PMI nonpalpable. Distant HS  Regular rate & rhythm. 2/6 TR. +s3 Lungs: clear Abdomen: soft, nontender, nondistended. No hepatosplenomegaly. No bruits or masses. Good bowel sounds. Extremities: no cyanosis, clubbing, rash, tr edema Neuro: alert & oriented x 3, cranial nerves grossly intact. moves all 4 extremities w/o difficulty. Affect pleasant.   ASSESSMENT:  1. Chronic systolic HF with biventricular HF EF 10%  2. Cardiogenic shock 3. VT s/p Biotronik ICD 4. Chronic renal failure  5. [name] 6. SVT s/p ablation  7. Blood type A pos   PLAN/DISCUSSION:  Improved with dobutamine. Renal function better. Less symptomatic. Co-ox still borderline. Will increase dobutamine to 4. Diurese gently.  CRT-D upgrade is an option but I don't think that will be sufficient given how bad her HF is currently. Will begin VAD/Tx work-up. Get echo. Possibly home Monday on home dobutamine.   Jacion Dismore,MD 10:43 AM

## 2012-12-10 DIAGNOSIS — I447 Left bundle-branch block, unspecified: Secondary | ICD-10-CM

## 2012-12-10 DIAGNOSIS — I369 Nonrheumatic tricuspid valve disorder, unspecified: Secondary | ICD-10-CM

## 2012-12-10 DIAGNOSIS — E876 Hypokalemia: Secondary | ICD-10-CM

## 2012-12-10 LAB — BASIC METABOLIC PANEL
BUN: 30 mg/dL — ABNORMAL HIGH (ref 6–23)
CO2: 25 mEq/L (ref 19–32)
GFR calc non Af Amer: 40 mL/min — ABNORMAL LOW (ref >90)
Glucose, Bld: 118 mg/dL — ABNORMAL HIGH (ref 70–99)
Potassium: 3.7 mEq/L (ref 3.5–5.1)

## 2012-12-10 LAB — CARBOXYHEMOGLOBIN: Carboxyhemoglobin: 1.1 % (ref 0.5–1.5)

## 2012-12-10 MED ORDER — PERFLUTREN LIPID MICROSPHERE
1.0000 mL | INTRAVENOUS | Status: AC | PRN
  Filled 2012-12-10: qty 10

## 2012-12-10 MED ORDER — PERFLUTREN LIPID MICROSPHERE
INTRAVENOUS | Status: AC
  Administered 2012-12-10: 2 mL
  Filled 2012-12-10: qty 10

## 2012-12-10 MED ORDER — ZOLPIDEM TARTRATE 5 MG PO TABS
5.0000 mg | ORAL_TABLET | Freq: Every evening | ORAL | Status: DC | PRN
  Administered 2012-12-10 – 2012-12-11 (×2): 5 mg via ORAL
  Filled 2012-12-10 (×2): qty 1

## 2012-12-10 MED ORDER — TORSEMIDE 20 MG PO TABS
20.0000 mg | ORAL_TABLET | Freq: Two times a day (BID) | ORAL | Status: DC
  Administered 2012-12-10: 20 mg via ORAL
  Filled 2012-12-10 (×4): qty 1

## 2012-12-10 MED ORDER — POTASSIUM CHLORIDE ER 10 MEQ PO TBCR
20.0000 meq | EXTENDED_RELEASE_TABLET | Freq: Two times a day (BID) | ORAL | Status: DC
  Administered 2012-12-10: 20 meq via ORAL
  Filled 2012-12-10 (×3): qty 2

## 2012-12-10 NOTE — Progress Notes (Signed)
ADVANCED HF Rounding Note   Kaitlin [name] is a 67 y/o woman with h/o severe HTN, [name], VT s/p Biotronik ICD (2009) and 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 EF 15-25% with mild to moderate RV dysfunction   CPX 7/14 Peak VO2: 12.3 (63% predicted peak VO2) VE/VCO2 slope: 42.1 OUES: 1.03 Peak RER: 1.1 Ventilatory Threshold: 9.4 (48.1% predicted peak VO2) VE/MVV: 43.6% O2pulse: 8 (89% predicted O2pulse)  Underwent cath 8/8 with low output physiology.  RA = 13  RV = 27/7/12  PA = 29/14 (20)  PCW = 8  Thermo cardiac output/index = 2.1/1.2  Fick CO/CI = 4.4/2.4  Fick CO/CI (with directly measured O2 consumption) = 2.4/1.4  PVR = 5.0 Woods (Fick)  FA sat = 96%  PA sat = 54%, 61%  SVC sat 64%  Noninvasive BP = 104/64 (76)  Unable to tolerate milrinone due to hypotension. Switched to dobutamine. Tolerating well. Feels better. BP improved. Co-ox unchanged but renal function improving dramatically. No significant ectopy. Diuresing well. CVP 9. Unable to sleep well.    Past Medical History  Diagnosis Date  . Paroxysmal supraventricular tachycardia   . Hypokalemia   . Chronic systolic heart failure     a. 12/2954 Echo: EF 15%, mod to sev antlat and inflat HK, inf AK, mild to mod MR, mildly/mod reduced RV fxn, Mod TR, PASP .  [name] History of DVT (deep vein thrombosis)   . Dyslipidemia   . HTN (hypertension)   . Nonischemic cardiomyopathy     a. 06/2012 Echo: EF 15%;  b. 06/2012 Cath: nl except luminal irregs in LAD.  [name] Ventricular tachycardia     a. s/p ICD  . Goiter   . Implantable cardiac defibrillator- Biotronik     Single chamber  . [name] (left bundle branch block)     intermittent    Current Facility-Administered Medications  Medication Dose Route Frequency Provider Last Rate Last Dose  . 0.9 %  sodium chloride infusion  250 mL Intravenous PRN Dolores Patty, MD      . 0.9 %  sodium chloride infusion   Intravenous Continuous Dolores Patty, MD 10 mL/hr at 12/09/12 2330    .  acetaminophen (TYLENOL) tablet 650 mg  650 mg Oral Q4H PRN Dolores Patty, MD   650 mg at 12/08/12 1745  . amiodarone (PACERONE) tablet 200 mg  200 mg Oral BID [name] D [name], NP   200 mg at 12/10/12 0913  . aspirin chewable tablet 81 mg  81 mg Oral Daily [name] D [name], NP   81 mg at 12/10/12 0913  . atorvastatin (LIPITOR) tablet 20 mg  20 mg Oral q1800 Dolores Patty, MD   20 mg at 12/09/12 1802  . DOBUTamine (DOBUTREX) infusion 4000 mcg/mL  4 mcg/kg/min Intravenous Titrated Dolores Patty, MD 4.6 mL/hr at 12/10/12 0800 4 mcg/kg/min at 12/10/12 0800  . heparin injection 5,000 Units  5,000 Units Subcutaneous Q8H Dolores Patty, MD   5,000 Units at 12/10/12 [phone]  . ondansetron (ZOFRAN) injection 4 mg  4 mg Intravenous Q6H PRN Dolores Patty, MD      . potassium chloride (K-DUR) CR tablet 20 mEq  20 mEq Oral Daily Dolores Patty, MD   20 mEq at 12/10/12 0912  . sodium chloride 0.9 % injection 10-40 mL  10-40 mL Intracatheter PRN Dolores Patty, MD   10 mL at 12/09/12 2150  . sodium chloride 0.9 % injection 3 mL  3 mL Intravenous Q12H Reuel Boom  R Layn Kye, MD   3 mL at 12/10/12 0913  . sodium chloride 0.9 % injection 3 mL  3 mL Intravenous PRN Dolores Patty, MD      . torsemide [facility]) tablet 20 mg  20 mg Oral Daily Dolores Patty, MD   20 mg at 12/10/12 0913    PHYSICAL EXAM: Filed Vitals:   12/09/12 2100 12/10/12 0000 12/10/12 0400 12/10/12 0746  BP: 122/70 122/65 125/50 139/76  Pulse:    92  Temp:  98.4 F (36.9 C) 98.5 F (36.9 C) 98.4 F (36.9 C)  TempSrc:  Oral Oral Oral  Resp: 21 21 23 19   Height:      Weight:      SpO2:  95% 96% 98%    General:  Improved appearing. No respiratory difficulty HEENT: normal Neck: supple. JVP 10 Carotids 2+ bilat; no bruits. No lymphadenopathy or thryomegaly appreciated. Cor: PMI nonpalpable. Distant HS Regular rate & rhythm. 2/6 TR. +s3 Lungs: clear Abdomen: soft, nontender, nondistended. No hepatosplenomegaly. No  bruits or masses. Good bowel sounds. Extremities: no cyanosis, clubbing, rash, tr edema Neuro: alert & oriented x 3, cranial nerves grossly intact. moves all 4 extremities w/o difficulty. Affect pleasant.   ASSESSMENT:  1. Acute on chronic systolic HF with biventricular HF EF 10%  2. Cardiogenic shock 3. VT s/p Biotronik ICD 4. Chronic renal failure  5. [name] 6. SVT s/p ablation  7. Blood type A pos   PLAN/DISCUSSION:  Improving with dobutamine. Co-ox unchanged but renal function much better suggestive of improved perfusion. Continue diuresis. Place ted hose. Will give ambien to help sleep.   CRT-D upgrade is an option but I don't think that will be sufficient given how bad her HF is currently. Will begin VAD/Tx work-up.Echo pending. Possibly home tomorrow on home dobutamine.   Laural Eiland,MD 11:18 AM

## 2012-12-10 NOTE — Progress Notes (Signed)
Echocardiogram 2D Echocardiogram with Definity has been performed.  Kaitlin [name] 12/10/2012, 12:26 PM

## 2012-12-11 ENCOUNTER — Inpatient Hospital Stay (HOSPITAL_COMMUNITY): Payer: Commercial Managed Care - PPO

## 2012-12-11 DIAGNOSIS — I5023 Acute on chronic systolic (congestive) heart failure: Secondary | ICD-10-CM

## 2012-12-11 DIAGNOSIS — N179 Acute kidney failure, unspecified: Secondary | ICD-10-CM

## 2012-12-11 DIAGNOSIS — Z01810 Encounter for preprocedural cardiovascular examination: Secondary | ICD-10-CM

## 2012-12-11 DIAGNOSIS — N189 Chronic kidney disease, unspecified: Secondary | ICD-10-CM

## 2012-12-11 DIAGNOSIS — I428 Other cardiomyopathies: Secondary | ICD-10-CM

## 2012-12-11 LAB — PULMONARY FUNCTION TEST

## 2012-12-11 LAB — BASIC METABOLIC PANEL
BUN: 27 mg/dL — ABNORMAL HIGH (ref 6–23)
CO2: 25 mEq/L (ref 19–32)
Chloride: 106 mEq/L (ref 96–112)
Creatinine, Ser: 1.76 mg/dL — ABNORMAL HIGH (ref 0.50–1.10)
GFR calc Af Amer: 34 mL/min — ABNORMAL LOW (ref >90)
Glucose, Bld: 89 mg/dL (ref 70–99)
Potassium: 3.8 mEq/L (ref 3.5–5.1)

## 2012-12-11 LAB — HIV ANTIBODY (ROUTINE TESTING W REFLEX): HIV: NONREACTIVE

## 2012-12-11 LAB — CARBOXYHEMOGLOBIN
Carboxyhemoglobin: 1.2 % (ref 0.5–1.5)
Methemoglobin: 0.8 % (ref 0.0–1.5)

## 2012-12-11 LAB — TSH: TSH: 2.577 u[IU]/mL (ref 0.350–4.500)

## 2012-12-11 MED ORDER — ALTEPLASE 2 MG IJ SOLR
2.0000 mg | Freq: Once | INTRAMUSCULAR | Status: AC
  Administered 2012-12-11: 2 mg
  Filled 2012-12-11: qty 2

## 2012-12-11 MED ORDER — POTASSIUM CHLORIDE ER 10 MEQ PO TBCR
20.0000 meq | EXTENDED_RELEASE_TABLET | Freq: Every day | ORAL | Status: DC
  Administered 2012-12-11 – 2012-12-17 (×7): 20 meq via ORAL
  Filled 2012-12-11 (×10): qty 2

## 2012-12-11 MED ORDER — ISOSORBIDE MONONITRATE 15 MG HALF TABLET
15.0000 mg | ORAL_TABLET | Freq: Every day | ORAL | Status: DC
  Administered 2012-12-12 – 2012-12-17 (×6): 15 mg via ORAL
  Filled 2012-12-11 (×9): qty 1

## 2012-12-11 MED ORDER — HYDRALAZINE HCL 10 MG PO TABS
10.0000 mg | ORAL_TABLET | Freq: Three times a day (TID) | ORAL | Status: DC
  Administered 2012-12-11 – 2012-12-17 (×18): 10 mg via ORAL
  Filled 2012-12-11 (×25): qty 1

## 2012-12-11 NOTE — Progress Notes (Signed)
VASCULAR LAB PRELIMINARY  PRELIMINARY  PRELIMINARY  PRELIMINARY  Bilateral venous Dopplers completed.    Preliminary report:  There is no DVT or SVT noted in the bilateral lower extremities.  Cherylin Waguespack, RVT 12/11/2012, 5:31 PM

## 2012-12-11 NOTE — Progress Notes (Signed)
ADVANCED HF Rounding Note   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 EF 15-25% with mild to moderate RV dysfunction   CPX 7/14 Peak VO2: 12.3 (63% predicted peak VO2) VE/VCO2 slope: 42.1 OUES: 1.03 Peak RER: 1.1 Ventilatory Threshold: 9.4 (48.1% predicted peak VO2) VE/MVV: 43.6% O2pulse: 8 (89% predicted O2pulse)  Underwent cath 8/8 with low output physiology.  RA = 13  RV = 27/7/12  PA = 29/14 (20)  PCW = 8  Thermo cardiac output/index = 2.1/1.2  Fick CO/CI = 4.4/2.4  Fick CO/CI (with directly measured O2 consumption) = 2.4/1.4  PVR = 5.0 Woods (Fick)  FA sat = 96%  PA sat = 54%, 61%  SVC sat 64%  Noninvasive BP = 104/64 (76)  ECHO 12/10/12 EF 20%   Unable to tolerate milrinone due to hypotension. Switched to dobutamine at 4 mcg. On Saturday she was transitioned to torsemide 20 mg bid. Ambulated in unit without dyspnea.   Denies SOB/PND/Orthopnea. Did not sleep well.   PRE VAD TESTS Done  Blood Typing- A positive  ______ Doppler PreCABG in EPIC (Carotid Dopplers and ABIs)  ______ PFTs (Full with DLCO)  ______ TSH, Hepatitis Panel, HIV  ______ Chest/Abdominal CT if previous chest or abdominal surgery  ______ Venous Dopplers if suspect DVT  ______ Panorex (teeth questionable)  Done 12/08/12   Chest X-Ray (2 view)  ______ Abdominal US for AAA screen if > 46 yrs old or presence of PAD  ______ Social Work and Palliative Consult (Pre-VAD workup in comments)  Done 12/09/12   ECHO with EF documented <25%   CO-OX 57> 62 %  Creatinine 1.37>1.76  CVP ~8-9 Past Medical History  Diagnosis Date  . Paroxysmal supraventricular tachycardia   . Hypokalemia   . Chronic systolic heart failure     a. 08/979 Echo: EF 15%, mod to sev antlat and inflat HK, inf AK, mild to mod MR, mildly/mod reduced RV fxn, Mod TR, PASP .  [name] History of DVT (deep vein thrombosis)   . Dyslipidemia   . HTN (hypertension)    . Nonischemic cardiomyopathy     a. 06/2012 Echo: EF 15%;  b. 06/2012 Cath: nl except luminal irregs in LAD.  [name] Ventricular tachycardia     a. s/p ICD  . Goiter   . Implantable cardiac defibrillator- Biotronik     Single chamber  . [name] (left bundle branch block)     intermittent    Current Facility-Administered Medications  Medication Dose Route Frequency Provider Last Rate Last Dose  . 0.9 %  sodium chloride infusion  250 mL Intravenous PRN Dolores Patty, MD      . 0.9 %  sodium chloride infusion   Intravenous Continuous Dolores Patty, MD 10 mL/hr at 12/09/12 2330    . acetaminophen (TYLENOL) tablet 650 mg  650 mg Oral Q4H PRN Dolores Patty, MD   650 mg at 12/11/12 1914  . amiodarone (PACERONE) tablet 200 mg  200 mg Oral BID Sherald Hess, NP   200 mg at 12/10/12 2150  . aspirin chewable tablet 81 mg  81 mg Oral Daily [name] D [name], NP   81 mg at 12/10/12 0913  . atorvastatin (LIPITOR) tablet 20 mg  20 mg Oral q1800 Dolores Patty, MD   20 mg at 12/10/12 1744  . DOBUTamine (DOBUTREX) infusion 4000 mcg/mL  4 mcg/kg/min Intravenous Titrated Dolores Patty, MD  4.6 mL/hr at 12/10/12 2149 4 mcg/kg/min at 12/10/12 2149  . heparin injection 5,000 Units  5,000 Units Subcutaneous Q8H Dolores Patty, MD   5,000 Units at 12/11/12 [phone]  . ondansetron (ZOFRAN) injection 4 mg  4 mg Intravenous Q6H PRN Dolores Patty, MD      . potassium chloride (K-DUR) CR tablet 20 mEq  20 mEq Oral BID Dolores Patty, MD   20 mEq at 12/10/12 2150  . sodium chloride 0.9 % injection 10-40 mL  10-40 mL Intracatheter PRN Dolores Patty, MD   10 mL at 12/09/12 2150  . sodium chloride 0.9 % injection 3 mL  3 mL Intravenous Q12H Dolores Patty, MD   3 mL at 12/10/12 2152  . sodium chloride 0.9 % injection 3 mL  3 mL Intravenous PRN Dolores Patty, MD      . torsemide [facility]) tablet 20 mg  20 mg Oral BID Dolores Patty, MD   20 mg at 12/10/12 1743  . zolpidem (AMBIEN) tablet 5  mg  5 mg Oral QHS PRN Dolores Patty, MD   5 mg at 12/10/12 2150    PHYSICAL EXAM: Filed Vitals:   12/10/12 1655 12/10/12 1955 12/11/12 0000 12/11/12 0400  BP: 111/61 107/44  117/58  Pulse: 75 75    Temp: 98 F (36.7 C) 98.3 F (36.8 C)  99.1 F (37.3 C)  TempSrc: Oral Oral  Oral  Resp: 19 17  15   Height:      Weight:      SpO2: 98% 100% 97%    CVP ~8-9 General:  Improved appearing. No respiratory difficulty HEENT: normal Neck: supple. JVP 8 Carotids 2+ bilat; no bruits. No lymphadenopathy or thryomegaly appreciated. Cor: PMI nonpalpable. Distant HS Regular rate & rhythm. 2/6 TR. +s3 Lungs: clear Abdomen: soft, nontender, nondistended. No hepatosplenomegaly. No bruits or masses. Good bowel sounds. Extremities: no cyanosis, clubbing, rash, tr edema Neuro: alert & oriented x 3, cranial nerves grossly intact. moves all 4 extremities w/o difficulty. Affect pleasant.   ASSESSMENT:  1. Acute on chronic systolic HF with biventricular HF EF 10%  2. Cardiogenic shock 3. VT s/p Biotronik ICD 4. Chronic renal failure  5. [name] 6. SVT s/p ablation  7. Blood type A pos   PLAN/DISCUSSION:   Volume status stable. Renal function trending up. Hold torsemide today. Continue dobutamine at 4 mcg. No beta blocker. Keep off ace-inhibitor/spironolactone due to elevated creatinine. Add hydralazine 10 mg tid and Imdur 15 mg daily. Will follow up with [facility] for home dobutamine  Will begin VAD/Tx work-up. Will ask VAD coordinator to meet with Ms Thomasena Edis.  Check TSH, hepatitis panel, HIV. Check dopplers.   Continue cardiac rehab.   [name],[name] NP-C 7:43 AM  Patient seen and examined with Tonye Becket, NP. We discussed all aspects of the encounter. I agree with the assessment and plan as stated above.   She is improved symptomatically on dobutamine. Renal function slightly worse. Will hold torsemide today. Hopefully we can send home tomorrow. Will initiate transplant work-up   CRT-D upgrade is  an option but I don't think that will be sufficient given how bad her HF is currently.   Daniel Bensimhon,MD 8:22 AM

## 2012-12-11 NOTE — Progress Notes (Signed)
Pre-op Cardiac Surgery  Carotid Findings:  1-39% ICA stenosis.  Vertebral artery flow is antegrade.  Upper Extremity Right Left  Brachial Pressures restricted 117 T  Radial Waveforms    Ulnar Waveforms    Palmar Arch (Allen's Test)     Findings:      Lower  Extremity Right Left  Dorsalis Pedis    Anterior Tibial 121T 128T  Posterior Tibial 132T 146T  Ankle/Brachial Indices >1.0 >1.0    Findings:  WNL

## 2012-12-11 NOTE — Progress Notes (Signed)
Pharmacist Heart Failure Core Measure Documentation  Assessment: Kaitlin [name] has an EF documented as 20% on 12/10/12 by ECHO.  Rationale: Heart failure patients with left ventricular systolic dysfunction (LVSD) and an EF < 40% should be prescribed an angiotensin converting enzyme inhibitor (ACEI) or angiotensin receptor blocker (ARB) at discharge unless a contraindication is documented in the medical record.  This patient is not currently on an ACEI or ARB for HF.  This note is being placed in the record in order to provide documentation that a contraindication to the use of these agents is present for this encounter.  ACE Inhibitor or Angiotensin Receptor Blocker is contraindicated (specify all that apply)  []   ACEI allergy AND ARB allergy []   Angioedema []   Moderate or severe aortic stenosis []   Hyperkalemia []   Hypotension []   Renal artery stenosis [x]   Worsening renal function, preexisting renal disease or dysfunction   Margie Billet, PharmD Clinical Pharmacist - Resident Pager: [phone] Pharmacy: [phone] 12/11/2012 8:09 AM

## 2012-12-11 NOTE — Consult Note (Signed)
[address]       [place] [address]             [phone]        TWISHA VANPELT [facility] Medical Record #[id number] Date of Birth: 08/12/1945  Referring: No ref. provider found Primary Care: Evlyn Courier, MD  Chief Complaint:   Weakness, DOE from low cardiac output  History of Present Illness:     Patient examined, echocardiogram and cardiac catheterization reviewed. I was asked to evaluate this 67 year old female nondiabetic with  Non- ischemic cardiomyopathy by Dr. Gala Romney  for  possible implantable LVAD for class IIIB progressive heart failure. The patient was initially diagnosed with nonischemic cardiomyopathy several years ago but was managed medically and had good functional activity and was able to work at the [employer] in [place]. 6 months ago she was admitted with cardiogenic shock, hepatic and renal failure and again was optimize with conventional medical therapy. Over the past several weeks this summer she has had symptoms of low cardiac output with weakness, decreased exercise tolerance, dyspnea on exertion, but denies chest pain or PND. She underwent right heart catheterization which demonstrated a cardiac index of 1.3 and she was admitted and placed on initially milrinone and switch to dobutamine with improvement in her cardiac index. Her creatinine on admission was elevated at 2.4 and is reduced to 1.5. I reviewed her 2-D echocardiogram which demonstrates EF of 20 percent with mild to moderate RV dysfunction, mild to severe tricuspid regurgitation, moderate mitral regurgitation, no significant AI and no evidence of PFO.  The patient has a significant history of ventricular arrhythmia and had a single-chamber AICD placed by Dr. Ladona Ridgel 4 years ago. She states the AICD is discharged once in the past 6 months. She does not have biventricular pacing technology and synchronized pacing may be recommended to her.  The patient has a history of  hypertension. She denies any history of rheumatic fever or familial history of cardiomyopathy. She had no cardiac problems during her 2 pregnancies and deliveries. She denies any significant previous pulmonary disease including limited history of smoking, no repeated pulmonary infections or thoracic trauma, no history of DVT or pulmonary emboli.  Current Activity/ Functional Status: Not able to tolerate normal activities. She'll probably not be old return to work after this hospitalization on home inotrope -dobutamine    Zubrod Score: At the time of surgery this patient's most appropriate activity status/level should be described as: []  Normal activity, no symptoms []  Symptoms, fully ambulatory [x]  Symptoms, in bed less than or equal to 50% of the time []  Symptoms, in bed greater than 50% of the time but less than 100% []  Bedridden []  Moribund  Past Medical History  Diagnosis Date  . Paroxysmal supraventricular tachycardia   . Hypokalemia   . Chronic systolic heart failure     a. 12/6576 Echo: EF 15%, mod to sev antlat and inflat HK, inf AK, mild to mod MR, mildly/mod reduced RV fxn, Mod TR, PASP .  [name] History of DVT (deep vein thrombosis)   . Dyslipidemia   . HTN (hypertension)   . Nonischemic cardiomyopathy     a. 06/2012 Echo: EF 15%;  b. 06/2012 Cath: nl except luminal irregs in LAD.  [name] Ventricular tachycardia     a. s/p ICD  . Goiter   . Implantable cardiac defibrillator- Biotronik     Single chamber  . [name] (left bundle branch block)     intermittent  Past Surgical History  Procedure Laterality Date  . None    . Cardiac defibrillator placement      History  Smoking status  . Never Smoker   Smokeless tobacco  . Not on file    History  Alcohol Use No  Minimal   History   Social History  . Marital Status: Single    Spouse Name: N/A    Number of Children: N/A  . Years of Education: N/A   Occupational History  . 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 Smoking status: Never Smoker   . Smokeless tobacco: Not on file  . Alcohol Use: No  . Drug Use: No  . Sexually Active: Not on file   Other Topics Concern  . 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 Lives in [place], not married. 2 daughters/           No Known Allergies  Current Facility-Administered Medications  Medication Dose Route Frequency Provider Last Rate Last Dose  . 0.9 %  sodium chloride infusion  250 mL Intravenous PRN Dolores Patty, MD      . 0.9 %  sodium chloride infusion   Intravenous Continuous Dolores Patty, MD 10 mL/hr at 12/11/12 0700    . acetaminophen (TYLENOL) tablet 650 mg  650 mg Oral Q4H PRN Dolores Patty, MD   650 mg at 12/11/12 1610  . amiodarone (PACERONE) tablet 200 mg  200 mg Oral BID [name] D [name], NP   200 mg at 12/11/12 1200  . aspirin chewable tablet 81 mg  81 mg Oral Daily [name] D [name], NP   81 mg at 12/11/12 0954  . atorvastatin (LIPITOR) tablet 20 mg  20 mg Oral q1800 Dolores Patty, MD   20 mg at 12/10/12 1744  . DOBUTamine (DOBUTREX) infusion 4000 mcg/mL  4 mcg/kg/min Intravenous Titrated Dolores Patty, MD 4.6 mL/hr at 12/11/12 0700 4 mcg/kg/min at 12/11/12 0700  . heparin injection 5,000 Units  5,000 Units Subcutaneous Q8H Dolores Patty, MD   5,000 Units at 12/11/12 9604  . hydrALAZINE (APRESOLINE) tablet 10 mg  10 mg Oral Q8H [name] D [name], NP      . isosorbide mononitrate (IMDUR) 24 hr tablet 15 mg  15 mg Oral Daily [name] D [name], NP      . ondansetron (ZOFRAN) injection 4 mg  4 mg Intravenous Q6H PRN Dolores Patty, MD      . potassium chloride (K-DUR) CR tablet 20 mEq  20 mEq Oral Daily [name] D [name], NP   20 mEq at 12/11/12 1000  . sodium chloride 0.9 % injection 10-40 mL  10-40 mL Intracatheter PRN Dolores Patty, MD   10 mL at 12/09/12 2150  . sodium chloride 0.9 % injection 3 mL  3 mL Intravenous Q12H Dolores Patty, MD    3 mL at 12/11/12 1202  . sodium chloride 0.9 % injection 3 mL  3 mL Intravenous PRN Dolores Patty, MD      . zolpidem (AMBIEN) tablet 5 mg  5 mg Oral QHS PRN Dolores Patty, MD   5 mg at 12/10/12 2150    Prescriptions prior to admission  Medication Sig Dispense Refill  . acetaminophen (TYLENOL) 325 MG tablet Take 650 mg by mouth every 6 (six) hours as needed for pain.      [name] amiodarone (PACERONE) 200  MG tablet Take 300 mg by mouth daily. Take 200 mg q am and 100mg  q pm      . aspirin EC 81 MG tablet Take 81 mg by mouth daily.      . carvedilol (COREG) 25 MG tablet Take 1 tablet (25 mg total) by mouth 2 (two) times daily with a meal.  60 tablet  3  . Cyanocobalamin (B-12 PO) Take 1 tablet by mouth daily.      . furosemide (LASIX) 40 MG tablet Take 40 mg by mouth daily.      [name] lisinopril (PRINIVIL,ZESTRIL) 10 MG tablet Take 0.5 tablets (5 mg total) by mouth daily.  30 tablet  3  . simvastatin (ZOCOR) 40 MG tablet TAKE 1 TABLET AT BEDTIME  90 tablet  3    Family History  Problem Relation Age of Onset  . Emphysema Mother   . Heart disease Mother   . Heart attack Father   . Emphysema Brother      Review of Systems:  She is currently in sinus rhythm. She is not Coumadin. She is right-hand dominant. She has no history of diabetes. She has no history of bleeding or clotting diathesis.    Cardiac Review of Systems: Y or N  Chest Pain [N.   ]  Resting SOB [  N. ] Exertional SOB  [ Y. ]  Orthopnea [N.  ]   Pedal Edema [ Y.  ]    Palpitations [ Y. ] Syncope  [ N. ]   Presyncope [Y.   ]  General Review of Systems: [Y] = yes [  ]=no Constitional: recent weight change [  ]; anorexia [  ]; fatigue [  ]; nausea [  ]; night sweats [  ]; fever [  ]; or chills [  ]    weight has been stable                                                           Dental: poor dentition[Y.--full upper and lower plates  ]; Last Dentist visit: 1 year  Eye : blurred vision [  ]; diplopia [   ]; vision  changes [  ];  Amaurosis fugax[  ]; Resp: cough [  ];  wheezing[  ];  hemoptysis[  ]; shortness of breath[Y.  ]; paroxysmal nocturnal dyspnea[  ]; dyspnea on exertion[Y.  ]; or orthopnea[  ];  GI:  gallstones[  ], vomiting[  ];  dysphagia[ N. ]; melena[  ];  hematochezia [  ]; heartburn[  ];   Hx of  Colonoscopy[Y.-colonoscopy within the past 2 years negative  ]; GU: kidney stones [  ]; hematuria[  ];   dysuria [  ];  nocturia[  ];  history of     obstruction [  ]; urinary frequency [  ]             Skin: rash, swelling[  ];, hair loss[  ];  peripheral edema[ Y. ];  or itching[  ]; Musculosketetal: myalgias[  ];  joint swelling[  ];  joint erythema[  ];  joint pain[  ];  back pain[  ];  Heme/Lymph: bruising[  ];  bleeding[  ];  anemia[  ];  Neuro: TIA[  ];  headaches[  ];  stroke[  ];  vertigo[  ];  seizures[  ];   paresthesias[  ];  difficulty walking[  ];  Psych:depression[  ]; anxiety[  ];  Endocrine: diabetes[  ];  thyroid dysfunction[Y. thyroid scan 5 years ago showed diffuse enlargement without nodules  ];  Immunizations: Flu [  ]; Pneumococcal[  ];  Other:  Physical Exam: BP 89/58  Pulse 77  Temp(Src) 97.6 F (36.4 C) (Oral)  Resp 16  Ht 5\' 3"  (1.6 m)  Wt 168 lb 3.4 oz (76.3 kg)  BMI 29.8 kg/m2  SpO2 100%  Physical exam--- yet around brought out as BSA is 1.87 General appearance-middle-aged Afro-American female with cc in no acute distress HEENT-normocephalic pupils equal dentition poor plates Neck-mild JVD, no breathing adenopathy or mass Thorax-no deformity or tenderness, breath sounds clear Cardiac-regular rhythm 2/6 holosystolic murmur left sternal border from tricuspid regurgitation positive S3 sound Abdomen-soft nontender nondistended without organomegaly Extremities-no clubbing cyanosis edema or tenderness Neurologic-alert oriented appropriate no focal motor deficit   Diagnostic Studies & Laboratory data:   PFTs, carotid Doppler, lower extremity. ultrasound all  pending  Recent Radiology Findings:   No results found.  Chest x-ray clear, CT chest pending  Recent Lab Findings: Lab Results  Component Value Date   WBC 7.3 12/08/2012   HGB 10.9* 12/08/2012   HCT 32.4* 12/08/2012   PLT 226 12/08/2012   GLUCOSE 89 12/11/2012   CHOL 153 04/14/2010   TRIG 84.0 04/14/2010   HDL 44.60 04/14/2010   LDLCALC 92 04/14/2010   ALT 207* 06/26/2012   AST 51* 06/26/2012   NA 143 12/11/2012   K 3.8 12/11/2012   CL 106 12/11/2012   CREATININE 1.76* 12/11/2012   BUN 27* 12/11/2012   CO2 25 12/11/2012   TSH 1.641 06/18/2012   INR 1.10 12/07/2012   HGBA1C  Value: 6.1 (NOTE) The ADA recommends the following therapeutic goal for glycemic control related to Hgb A1c measurement: Goal of therapy: <6.5 Hgb A1c  Reference: American Diabetes Association: Clinical Practice Recommendations 2010, Diabetes Care, 2010, 33: (Suppl  1). 02/21/2009      Assessment / Plan:      Chronic systolic heart failure from biventricular dysfunction-nonischemic Severe TR with moderate RV dysfunction Left bundle branch block Status post ablation of SVT  Patient has advanced heart failure now requiring home inotropes- IV dobutamine. Plans are being made for transplant evaluation. We'll follow patient with heart failure team in clinic to monitor clinical condition and to assess the need for future LVAD support .     @ME1 @ 12/11/2012 12:48 PM

## 2012-12-11 NOTE — Progress Notes (Signed)
CARDIAC REHAB PHASE I   PRE:  Rate/Rhythm: 79 SR    BP: sitting 102/75    SaO2:   MODE:  Ambulation: 740 ft   POST:  Rate/Rhythm: burst of 121 ST with PVCs, mostly 100s walking    BP: sitting 118/67     SaO2: 98 RA  Tolerated well. Brief period of HR 121, mostly lower while walking. Pt asx. Pt seems overwhelmed by possibilities in her care. Requesting her daughter to be here when discussing VAD, etc.  Will f/u. [phone]  Elissa Lovett [place] CES, ACSM 12/11/2012 9:19 AM

## 2012-12-11 NOTE — Progress Notes (Signed)
Candace from vascular calls this nurse to inform that patient needs some tests done and this nurse confirms that she can leave from respiratory and have tests done (currently in respiratory for pulmonary function test)

## 2012-12-12 ENCOUNTER — Encounter (HOSPITAL_COMMUNITY): Payer: Self-pay

## 2012-12-12 ENCOUNTER — Inpatient Hospital Stay (HOSPITAL_COMMUNITY): Payer: Commercial Managed Care - PPO

## 2012-12-12 ENCOUNTER — Encounter (HOSPITAL_COMMUNITY)
Admission: RE | Disposition: A | Discharge status: Home Health | Payer: Self-pay | Source: Ambulatory Visit | Attending: Cardiothoracic Surgery

## 2012-12-12 DIAGNOSIS — I509 Heart failure, unspecified: Secondary | ICD-10-CM

## 2012-12-12 HISTORY — PX: RIGHT HEART CATHETERIZATION: SHX5447

## 2012-12-12 LAB — ANTITHROMBIN III: AntiThromb III Func: 89 % (ref 75–120)

## 2012-12-12 LAB — POCT I-STAT 3, VENOUS BLOOD GAS (G3P V)
Bicarbonate: 23.8 mEq/L (ref 20.0–24.0)
Bicarbonate: 24.7 mEq/L — ABNORMAL HIGH (ref 20.0–24.0)
O2 Saturation: 60 %
TCO2: 25 mmol/L (ref 0–100)
pCO2, Ven: 37.4 mmHg — ABNORMAL LOW (ref 45.0–50.0)
pH, Ven: 7.411 — ABNORMAL HIGH (ref 7.250–7.300)
pO2, Ven: 31 mmHg (ref 30.0–45.0)
pO2, Ven: 32 mmHg (ref 30.0–45.0)

## 2012-12-12 LAB — CARBOXYHEMOGLOBIN
Carboxyhemoglobin: 1.2 % (ref 0.5–1.5)
Carboxyhemoglobin: 1.3 % (ref 0.5–1.5)
Methemoglobin: 0.8 % (ref 0.0–1.5)
Total hemoglobin: 10.6 g/dL — ABNORMAL LOW (ref 12.0–16.0)

## 2012-12-12 LAB — BASIC METABOLIC PANEL
BUN: 19 mg/dL (ref 6–23)
BUN: 18 mg/dL (ref 6–23)
Chloride: 107 mEq/L (ref 96–112)
Creatinine, Ser: 1.41 mg/dL — ABNORMAL HIGH (ref 0.50–1.10)
GFR calc Af Amer: 45 mL/min — ABNORMAL LOW (ref >90)
GFR calc non Af Amer: 38 mL/min — ABNORMAL LOW (ref >90)
Glucose, Bld: 119 mg/dL — ABNORMAL HIGH (ref 70–99)
Potassium: 4.2 mEq/L (ref 3.5–5.1)
Potassium: 4.3 mEq/L (ref 3.5–5.1)

## 2012-12-12 SURGERY — RIGHT HEART CATH
Anesthesia: LOCAL

## 2012-12-12 MED ORDER — SODIUM CHLORIDE 0.9 % IJ SOLN
3.0000 mL | Freq: Two times a day (BID) | INTRAMUSCULAR | Status: DC
  Administered 2012-12-12: 3 mL via INTRAVENOUS

## 2012-12-12 MED ORDER — ACETAMINOPHEN 325 MG PO TABS
650.0000 mg | ORAL_TABLET | ORAL | Status: DC | PRN
  Filled 2012-12-12: qty 2

## 2012-12-12 MED ORDER — SODIUM CHLORIDE 0.9 % IV SOLN: 250.0000 mL | INTRAVENOUS | Status: DC | PRN

## 2012-12-12 MED ORDER — SODIUM CHLORIDE 0.9 % IJ SOLN: 3.0000 mL | INTRAMUSCULAR | Status: DC | PRN

## 2012-12-12 MED ORDER — ALBUTEROL SULFATE (5 MG/ML) 0.5% IN NEBU
INHALATION_SOLUTION | RESPIRATORY_TRACT | Status: AC
  Filled 2012-12-12: qty 0.5

## 2012-12-12 MED ORDER — SODIUM CHLORIDE 0.9 % IV BOLUS (SEPSIS)
250.0000 mL | Freq: Once | INTRAVENOUS | Status: AC
  Administered 2012-12-12: 250 mL via INTRAVENOUS

## 2012-12-12 MED ORDER — SILDENAFIL CITRATE 20 MG PO TABS
20.0000 mg | ORAL_TABLET | Freq: Three times a day (TID) | ORAL | Status: DC
  Administered 2012-12-12: 20 mg via ORAL
  Filled 2012-12-12 (×6): qty 1

## 2012-12-12 MED ORDER — FENTANYL CITRATE 0.05 MG/ML IJ SOLN
INTRAMUSCULAR | Status: AC
  Filled 2012-12-12: qty 2

## 2012-12-12 MED ORDER — ONDANSETRON HCL 4 MG/2ML IJ SOLN: 4.0000 mg | Freq: Four times a day (QID) | INTRAMUSCULAR | Status: DC | PRN

## 2012-12-12 MED ORDER — SODIUM CHLORIDE 0.9 % IJ SOLN
3.0000 mL | Freq: Two times a day (BID) | INTRAMUSCULAR | Status: DC
  Administered 2012-12-12 – 2012-12-14 (×3): 3 mL via INTRAVENOUS

## 2012-12-12 MED ORDER — HEPARIN SODIUM (PORCINE) 5000 UNIT/ML IJ SOLN
5000.0000 [IU] | Freq: Three times a day (TID) | INTRAMUSCULAR | Status: DC
  Filled 2012-12-12 (×2): qty 1

## 2012-12-12 MED ORDER — FUROSEMIDE 10 MG/ML IJ SOLN
40.0000 mg | Freq: Once | INTRAMUSCULAR | Status: AC
  Administered 2012-12-12: 40 mg via INTRAVENOUS
  Filled 2012-12-12: qty 4

## 2012-12-12 MED ORDER — LIDOCAINE HCL (PF) 1 % IJ SOLN
INTRAMUSCULAR | Status: AC
  Filled 2012-12-12: qty 30

## 2012-12-12 MED ORDER — MIDAZOLAM HCL 2 MG/2ML IJ SOLN
INTRAMUSCULAR | Status: AC
  Filled 2012-12-12: qty 2

## 2012-12-12 MED ORDER — HEPARIN (PORCINE) IN NACL 2-0.9 UNIT/ML-% IJ SOLN
INTRAMUSCULAR | Status: AC
  Filled 2012-12-12: qty 500

## 2012-12-12 NOTE — Progress Notes (Signed)
CRITICAL VALUE ALERT  Critical value received:  O2 Sat (Carboxyhemoglobin)  Date of notification:  12/12/2012  Time of notification:  0555  Critical value read back:yes  Nurse who received alert:  Elson Clan, RN  MD notified (1st page):  Hochrein  Time of first page:  0556  MD notified (2nd page):  Time of second page:  Responding MD:  Hochrein   Time MD responded:  [phone]

## 2012-12-12 NOTE — Progress Notes (Signed)
Spoke with Dr Margarita Grizzle and per Herby Abraham will retract PICC 3cm and re-xray to confirm placement

## 2012-12-12 NOTE — Progress Notes (Signed)
[facility]  Patient Status: New pt with [facility]  this admission   [facility] is providing the following services:  [facility] will provide RN and home infusion pharmacy services for home IV Dobutamine upon DC to home.  [facility] hospital infusion coordinator will provide in hospital pre-discharge teaching regarding POC for home Dobutamine to support transition home when deemed appropriate by HF Team.  If patient discharges after hours, please call [phone].   Sedalia Muta 12/12/2012, 9:31 AM

## 2012-12-12 NOTE — H&P (View-Only) (Signed)
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 EF 15-25% with mild to moderate RV dysfunction   CPX 7/14 Peak VO2: 12.3 (63% predicted peak VO2) VE/VCO2 slope: 42.1 OUES: 1.03 Peak RER: 1.1 Ventilatory Threshold: 9.4 (48.1% predicted peak VO2) VE/MVV: 43.6% O2pulse: 8 (89% predicted O2pulse)  Underwent cath 8/8 with low output physiology.  RA = 13  RV = 27/7/12  PA = 29/14 (20)  PCW = 8  Thermo cardiac output/index = 2.1/1.2  Fick CO/CI = 4.4/2.4  Fick CO/CI (with directly measured O2 consumption) = 2.4/1.4  PVR = 5.0 Woods (Fick)  FA sat = 96%  PA sat = 54%, 61%  SVC sat 64%  Noninvasive BP = 104/64 (76)  ECHO 12/10/12 EF 20%   Unable to tolerate milrinone due to hypotension. Switched to dobutamine at 4 mcg. On Saturday she was transitioned to torsemide 20 mg bid. Yesterday diuretics held.    Feels worse today. Less energy. Didn't sleep well. Breathless. Cr slightly improved.  CO-OX 57> 62>43>(63 repeat %) Creatinine 1.37>1.76 >1.4 CVP ~13-14  PRE VAD TESTS Done   Blood Typing- A positive Done  Doppler PreCABG in EPIC (Carotid Dopplers and ABIs) ______ PFTs (Full with DLCO) Done   TSH, Hepatitis Panel, HIV Done   Chest/Abdominal CT if previous chest or abdominal surgery NA  Venous Dopplers if suspect DVT NA  Panorex (teeth questionable) Done 12/08/12   Chest X-Ray (2 view) ______ Abdominal US for AAA screen if > 17 yrs old or presence of PAD ______ Social Work and Palliative Consult (Pre-VAD workup in comments) Done 12/09/12   ECHO with EF documented <25%    Past Medical History  Diagnosis Date  . Paroxysmal supraventricular tachycardia   . Hypokalemia   . Chronic systolic heart failure     a. 01/6044 Echo: EF 15%, mod to sev antlat and inflat HK, inf AK, mild to mod MR, mildly/mod reduced RV fxn, Mod TR, PASP .  [name] History of DVT (deep vein thrombosis)   . Dyslipidemia    . HTN (hypertension)   . Nonischemic cardiomyopathy     a. 06/2012 Echo: EF 15%;  b. 06/2012 Cath: nl except luminal irregs in LAD.  [name] Ventricular tachycardia     a. s/p ICD  . Goiter   . Implantable cardiac defibrillator- Biotronik     Single chamber  . [name] (left bundle branch block)     intermittent    Current Facility-Administered Medications  Medication Dose Route Frequency Provider Last Rate Last Dose  . 0.9 %  sodium chloride infusion  250 mL Intravenous PRN Dolores Patty, MD      . 0.9 %  sodium chloride infusion   Intravenous Continuous Dolores Patty, MD 16 mL/hr at 12/11/12 2300    . acetaminophen (TYLENOL) tablet 650 mg  650 mg Oral Q4H PRN Dolores Patty, MD   650 mg at 12/11/12 4098  . amiodarone (PACERONE) tablet 200 mg  200 mg Oral BID Sherald Hess, NP   200 mg at 12/11/12 2214  . aspirin chewable tablet 81 mg  81 mg Oral Daily [name] D [name], NP   81 mg at 12/11/12 0954  . atorvastatin (LIPITOR) tablet 20 mg  20 mg Oral q1800 Dolores Patty, MD   20 mg at 12/11/12 1749  . DOBUTamine (DOBUTREX) infusion 4000 mcg/mL  4 mcg/kg/min Intravenous Titrated Reuel Boom  R Arrion Broaddus, MD 4.6 mL/hr at 12/11/12 0700 4 mcg/kg/min at 12/11/12 0700  . heparin injection 5,000 Units  5,000 Units Subcutaneous Q8H Dolores Patty, MD   5,000 Units at 12/12/12 0526  . hydrALAZINE (APRESOLINE) tablet 10 mg  10 mg Oral Q8H [name] D [name], NP   10 mg at 12/12/12 0526  . isosorbide mononitrate (IMDUR) 24 hr tablet 15 mg  15 mg Oral Daily [name] D [name], NP      . ondansetron (ZOFRAN) injection 4 mg  4 mg Intravenous Q6H PRN Dolores Patty, MD      . potassium chloride (K-DUR) CR tablet 20 mEq  20 mEq Oral Daily [name] D [name], NP   20 mEq at 12/11/12 1000  . sodium chloride 0.9 % injection 10-40 mL  10-40 mL Intracatheter PRN Dolores Patty, MD   10 mL at 12/09/12 2150  . sodium chloride 0.9 % injection 3 mL  3 mL Intravenous Q12H Dolores Patty, MD   3 mL at 12/11/12 2214  . sodium  chloride 0.9 % injection 3 mL  3 mL Intravenous PRN Dolores Patty, MD      . zolpidem [name]) tablet 5 mg  5 mg Oral QHS PRN Dolores Patty, MD   5 mg at 12/11/12 2223    PHYSICAL EXAM: Filed Vitals:   12/12/12 0353 12/12/12 0400 12/12/12 0500 12/12/12 0700  BP: 93/58 95/51    Pulse: 97     Temp: 98.5 F (36.9 C)     TempSrc: Oral     Resp: 24 19    Height:      Weight:   176 lb 9.4 oz (80.1 kg) 167 lb 6.4 oz (75.932 kg)  SpO2: 98%      CVP ~13-14 General: Fatigued appearing. Mildly dyspneic HEENT: normal Neck: supple. JVP up Carotids 2+ bilat; no bruits. No lymphadenopathy or thryomegaly appreciated. Cor: PMI nonpalpable. Distant HS Regular rate & rhythm. 2/6 TR. +s3 Lungs: clear Abdomen: soft, nontender, nondistended. No hepatosplenomegaly. No bruits or masses. Good bowel sounds. Extremities: no cyanosis, clubbing, rash, tr edema Bilateral ted hose  Neuro: alert & oriented x 3, cranial nerves grossly intact. moves all 4 extremities w/o difficulty. Affect pleasant.   ASSESSMENT:  1. Acute on chronic systolic HF with biventricular HF EF 10%  2. Cardiogenic shock 3. VT s/p Biotronik ICD 4. Chronic renal failure  5. [name] 6. SVT s/p ablation  7. Blood type A pos   PLAN/DISCUSSION:   Volume status trending up however diuretics held. CVP up slightly. 11-12. Give 40 mg IV lasix now.  Renal function ok.   Continue dobutamine at 4 mcg. No beta blocker. Keep off ace-inhibitor/spironolactone due to elevated creatinine. Continue hydralazine 10 mg tid and Imdur 15 mg daily. Will follow up with [name] for home dobutamine.  Will begin VAD/Tx work-up. Plan to send to [name] for transplant evaluation after discharge.   Continue cardiac rehab.   Keep one more day.   [name],[name] NP-C 7:40 AM  Patient seen and examined with Tonye Becket, NP. We discussed all aspects of the encounter. I agree with the assessment and plan as stated above.   She remains very tenuous despite dobutamine  support. CVP climbing. Renal function labile. I think she is running out of time for advanced therapies. Will take her to cath lab today for [name] placement and possible IABP.   I discussed case with Dr. Jory Ee (director of Transplant at [name]) who agrees that she  will likely not be stable enough to wait for transplant and will likely need VAD in near future if RV function will tolerate. We will fax her info to their transplant team today to review.  The patient is critically ill with multiple organ systems failure and requires high complexity decision making for assessment and support, frequent evaluation and titration of therapies, application of advanced monitoring technologies and extensive interpretation of multiple databases.   Critical Care Time devoted to patient care services described in this note is 35 Minutes.  Dennie Moltz,MD 2:57 PM

## 2012-12-12 NOTE — Progress Notes (Signed)
Thank you for consulting the Palliative Medicine Team at [facility] to meet your patient's and family's needs.  The reason that you asked [place] to see your patient is for LVAD work up  We have scheduled your patient for a meeting Wed 8/13  @   1pm  ML  Chalmers Cater RN,  PMT RN Liaison Call PMT phone [phone]

## 2012-12-12 NOTE — Progress Notes (Signed)
CSW was referred to proceed with SW LVAD psychosocial assessment today; CSW went to see patient and per nursing she is in the Cath Lab.  She will be sent to ICU after the procedure.  CSW spoke to patient's daughter Sherry Ruffing regarding need to schedule LVAD assessment. She stated that family and patient will meet with Thomes Dinning, NP- tomorrow at 1 pm.  Thus, will plan for SW Psychosocial assessment to begin at 11:00 tomorrow. Daughter is somewhat concerned that this will be very taxing on patient but wants family present during meetings and they will be available tomorrow.  CSW will meet with patient and family in patient's room at 11:00 tomorrow.  Lorri Frederick. [place]  [phone]

## 2012-12-12 NOTE — Progress Notes (Signed)
MD at bedside; 250cc bolus ordered for low BP

## 2012-12-12 NOTE — Progress Notes (Signed)
4 Days Post-Op Procedure(s) (LRB): RIGHT HEART CATH (N/A) Subjective: Idiopathic CM with adv HF Chest CT clean Carotid dopplers clean Creatinine better Objective: Vital signs in last 24 hours: Temp:  [97.6 F (36.4 C)-98.6 F (37 C)] 98.6 F (37 C) (08/12 0751) Pulse Rate:  [77-97] 93 (08/12 0800) Cardiac Rhythm:  [-] Heart block (08/12 0800) Resp:  [13-25] 19 (08/12 0400) BP: (89-121)/(39-70) 96/69 mmHg (08/12 0800) SpO2:  [97 %-100 %] 97 % (08/12 0751) Weight:  [167 lb 6.4 oz (75.932 kg)-176 lb 9.4 oz (80.1 kg)] 167 lb 6.4 oz (75.932 kg) (08/12 0700)  Hemodynamic parameters for last 24 hours: CVP:  [9 mmHg-15 mmHg] 15 mmHg  Intake/Output from previous day: 08/11 0701 - 08/12 0700 In: 1278.4 [P.O.:880; I.V.:398.4] Out: -  Intake/Output this shift: Total I/O In: 44.2 [I.V.:44.2] Out: -     Lab Results: No results found for this basename: WBC, HGB, HCT, PLT,  in the last 72 hours BMET:  Recent Labs  12/11/12 0525 12/12/12 0707  NA 143 141  K 3.8 4.2  CL 106 107  CO2 25 26  GLUCOSE 89 96  BUN 27* 19  CREATININE 1.76* 1.40*  CALCIUM 9.8 9.7    PT/INR: No results found for this basename: LABPROT, INR,  in the last 72 hours ABG    Component Value Date/Time   PHART 7.417 12/08/2012 1112   HCO3 23.8 12/08/2012 1142   TCO2 25 12/08/2012 1142   ACIDBASEDEF 2.0 12/08/2012 1142   O2SAT 63.4 12/12/2012 0800   CBG (last 3)  No results found for this basename: GLUCAP,  in the last 72 hours  Assessment/Plan: S/P Procedure(s) (LRB): RIGHT HEART CATH (N/A) Cont with transplant, possible VAD w/u   LOS: 4 days    Kaitlin [name],Kaitlin [name] 12/12/2012

## 2012-12-12 NOTE — Progress Notes (Signed)
PICC OK to use per Dr. Gala Romney & Radiology MD

## 2012-12-12 NOTE — Interval H&P Note (Signed)
History and Physical Interval Note:  12/12/2012 2:59 PM  Kaitlin [name]  has presented today for surgery, with the diagnosis of hf  The various methods of treatment have been discussed with the patient and family. After consideration of risks, benefits and other options for treatment, the patient has consented to  Procedure(s): RIGHT HEART CATH (N/A) as a surgical intervention .  The patient's history has been reviewed, patient examined, no change in status, stable for surgery.  I have reviewed the patient's chart and labs.  Questions were answered to the patient's satisfaction.     Daniel Bensimhon

## 2012-12-12 NOTE — Progress Notes (Signed)
ADVANCED HF Rounding Note  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 EF 15-25% with mild to moderate RV dysfunction   CPX 7/14 Peak VO2: 12.3 (63% predicted peak VO2) VE/VCO2 slope: 42.1 OUES: 1.03 Peak RER: 1.1 Ventilatory Threshold: 9.4 (48.1% predicted peak VO2) VE/MVV: 43.6% O2pulse: 8 (89% predicted O2pulse)  Underwent cath 8/8 with low output physiology.  RA = 13  RV = 27/7/12  PA = 29/14 (20)  PCW = 8  Thermo cardiac output/index = 2.1/1.2  Fick CO/CI = 4.4/2.4  Fick CO/CI (with directly measured O2 consumption) = 2.4/1.4  PVR = 5.0 Woods (Fick)  FA sat = 96%  PA sat = 54%, 61%  SVC sat 64%  Noninvasive BP = 104/64 (76)  ECHO 12/10/12 EF 20%   Unable to tolerate milrinone due to hypotension. Switched to dobutamine at 4 mcg. On Saturday she was transitioned to torsemide 20 mg bid. Yesterday diuretics held.    Feels worse today. Less energy. Didn't sleep well. Breathless. Cr slightly improved.  CO-OX 57> 62>43>(63 repeat %) Creatinine 1.37>1.76 >1.4 CVP ~13-14  PRE VAD TESTS Done   Blood Typing- A positive Done  Doppler PreCABG in EPIC (Carotid Dopplers and ABIs) ______ PFTs (Full with DLCO) Done   TSH, Hepatitis Panel, HIV Done   Chest/Abdominal CT if previous chest or abdominal surgery NA  Venous Dopplers if suspect DVT NA  Panorex (teeth questionable) Done 12/08/12   Chest X-Ray (2 view) ______ Abdominal US for AAA screen if > 60 yrs old or presence of PAD ______ Social Work and Palliative Consult (Pre-VAD workup in comments) Done 12/09/12   ECHO with EF documented <25%    Past Medical History  Diagnosis Date  . Paroxysmal supraventricular tachycardia   . Hypokalemia   . Chronic systolic heart failure     a. 06/2012 Echo: EF 15%, mod to sev antlat and inflat HK, inf AK, mild to mod MR, mildly/mod reduced RV fxn, Mod TR, PASP 46mmHg.  . History of DVT (deep vein thrombosis)   . Dyslipidemia    . HTN (hypertension)   . Nonischemic cardiomyopathy     a. 06/2012 Echo: EF 15%;  b. 06/2012 Cath: nl except luminal irregs in LAD.  . Ventricular tachycardia     a. s/p ICD  . Goiter   . Implantable cardiac defibrillator- Biotronik     Single chamber  . [name] (left bundle branch block)     intermittent    Current Facility-Administered Medications  Medication Dose Route Frequency Provider Last Rate Last Dose  . 0.9 %  sodium chloride infusion  250 mL Intravenous PRN Daniel R Bensimhon, MD      . 0.9 %  sodium chloride infusion   Intravenous Continuous Daniel R Bensimhon, MD 16 mL/hr at 12/11/12 2300    . acetaminophen (TYLENOL) tablet 650 mg  650 mg Oral Q4H PRN Daniel R Bensimhon, MD   650 mg at 12/11/12 0624  . amiodarone (PACERONE) tablet 200 mg  200 mg Oral BID [name] D [name], NP   200 mg at 12/11/12 2214  . aspirin chewable tablet 81 mg  81 mg Oral Daily [name] D [name], NP   81 mg at 12/11/12 0954  . atorvastatin (LIPITOR) tablet 20 mg  20 mg Oral q1800 Daniel R Bensimhon, MD   20 mg at 12/11/12 1749  . DOBUTamine (DOBUTREX) infusion 4000 mcg/mL  4 mcg/kg/min Intravenous Titrated Daniel   R Bensimhon, MD 4.6 mL/hr at 12/11/12 0700 4 mcg/kg/min at 12/11/12 0700  . heparin injection 5,000 Units  5,000 Units Subcutaneous Q8H Daniel R Bensimhon, MD   5,000 Units at 12/12/12 0526  . hydrALAZINE (APRESOLINE) tablet 10 mg  10 mg Oral Q8H [name] D [name], NP   10 mg at 12/12/12 0526  . isosorbide mononitrate (IMDUR) 24 hr tablet 15 mg  15 mg Oral Daily [name] D [name], NP      . ondansetron (ZOFRAN) injection 4 mg  4 mg Intravenous Q6H PRN Daniel R Bensimhon, MD      . potassium chloride (K-DUR) CR tablet 20 mEq  20 mEq Oral Daily [name] D [name], NP   20 mEq at 12/11/12 1000  . sodium chloride 0.9 % injection 10-40 mL  10-40 mL Intracatheter PRN Daniel R Bensimhon, MD   10 mL at 12/09/12 2150  . sodium chloride 0.9 % injection 3 mL  3 mL Intravenous Q12H Daniel R Bensimhon, MD   3 mL at 12/11/12 2214  . sodium  chloride 0.9 % injection 3 mL  3 mL Intravenous PRN Daniel R Bensimhon, MD      . zolpidem (AMBIEN) tablet 5 mg  5 mg Oral QHS PRN Daniel R Bensimhon, MD   5 mg at 12/11/12 2223    PHYSICAL EXAM: Filed Vitals:   12/12/12 0353 12/12/12 0400 12/12/12 0500 12/12/12 0700  BP: 93/58 95/51    Pulse: 97     Temp: 98.5 F (36.9 C)     TempSrc: Oral     Resp: 24 19    Height:      Weight:   176 lb 9.4 oz (80.1 kg) 167 lb 6.4 oz (75.932 kg)  SpO2: 98%      CVP ~13-14 General: Fatigued appearing. Mildly dyspneic HEENT: normal Neck: supple. JVP up Carotids 2+ bilat; no bruits. No lymphadenopathy or thryomegaly appreciated. Cor: PMI nonpalpable. Distant HS Regular rate & rhythm. 2/6 TR. +s3 Lungs: clear Abdomen: soft, nontender, nondistended. No hepatosplenomegaly. No bruits or masses. Good bowel sounds. Extremities: no cyanosis, clubbing, rash, tr edema Bilateral ted hose  Neuro: alert & oriented x 3, cranial nerves grossly intact. moves all 4 extremities w/o difficulty. Affect pleasant.   ASSESSMENT:  1. Acute on chronic systolic HF with biventricular HF EF 10%  2. Cardiogenic shock 3. VT s/p Biotronik ICD 4. Chronic renal failure  5. [name] 6. SVT s/p ablation  7. Blood type A pos   PLAN/DISCUSSION:   Volume status trending up however diuretics held. CVP up slightly. 11-12. Give 40 mg IV lasix now.  Renal function ok.   Continue dobutamine at 4 mcg. No beta blocker. Keep off ace-inhibitor/spironolactone due to elevated creatinine. Continue hydralazine 10 mg tid and Imdur 15 mg daily. Will follow up with [facility] for home dobutamine.  Will begin VAD/Tx work-up. Plan to send to [facility] for transplant evaluation after discharge.   Continue cardiac rehab.   Keep one more day.   [name],[name] NP-C 7:40 AM  Patient seen and examined with [name] [name], NP. We discussed all aspects of the encounter. I agree with the assessment and plan as stated above.   She remains very tenuous despite dobutamine  support. CVP climbing. Renal function labile. I think she is running out of time for advanced therapies. Will take her to cath lab today for Swan placement and possible IABP.   I discussed case with Dr. Ted Frank (director of Transplant at [facility]) who agrees that she   will likely not be stable enough to wait for transplant and will likely need VAD in near future if RV function will tolerate. We will fax her info to their transplant team today to review.  The patient is critically ill with multiple organ systems failure and requires high complexity decision making for assessment and support, frequent evaluation and titration of therapies, application of advanced monitoring technologies and extensive interpretation of multiple databases.   Critical Care Time devoted to patient care services described in this note is 35 Minutes.  Daniel Bensimhon,MD 2:57 PM             

## 2012-12-12 NOTE — Progress Notes (Signed)
Ok to leave PICC in present location per Radiology.

## 2012-12-12 NOTE — Progress Notes (Addendum)
Pt noted to have a 12 beat run of V tach. Pt also noted to have a T wave abnormaility, and slight rhythm change. Pt has no complaints at this time. 12 lead EKG done. Hochrein MD notified. No new orders at this time. Will continue to monitor.

## 2012-12-12 NOTE — CV Procedure (Signed)
Cardiac Cath Procedure Note:  Indication:  Heart failure  Procedures performed:  1) Right heart catheterization  Description of procedure:   The risks and indication of the procedure were explained. Consent was signed and placed on the chart. An appropriate timeout was taken prior to the procedure. The right neck was prepped and draped in the routine sterile fashion and anesthetized with 1% local lidocaine.   A 8 FR venous sheath was placed in the right internal jugular vein using a modified Seldinger technique. A standard Swan-Ganz catheter was used for the procedure.   Complications: None apparent.  Findings:  Done on dobutamine 4mg /kg/min  RA = 7 RV = 39/2/7 PA = 40/14 (24) PCW = 9 Fick cardiac output/index = 4.6/2.5 Thermo CO/CI = 3.0/1.7 PVR = 5 Woods SVR = 1495 FA sat = 97% PA sat = 60%, 63% Noninvasive BP 121/77 (93)  Assessment/Plan:  Hemodynamics much improved but cardiac output remains tenuous (last cath thermodilution was more accurate than Fick). PVR slightly high. Will start sildenafil. Given clinical instability would favor her remaining in house until VAD can be placed as bridge to transplant.  Daniel Bensimhon,MD 3:35 PM     Reuel Boom Bensimhon 3:30 PM

## 2012-12-13 DIAGNOSIS — R5381 Other malaise: Secondary | ICD-10-CM

## 2012-12-13 DIAGNOSIS — I428 Other cardiomyopathies: Secondary | ICD-10-CM

## 2012-12-13 LAB — LUPUS ANTICOAGULANT PANEL
DRVVT: 28 secs (ref <42.9)
Lupus Anticoagulant: NOT DETECTED
PTT Lupus Anticoagulant: 34.7 secs (ref 28.0–43.0)

## 2012-12-13 LAB — BASIC METABOLIC PANEL
CO2: 25 mEq/L (ref 19–32)
Calcium: 9.6 mg/dL (ref 8.4–10.5)
Creatinine, Ser: 1.38 mg/dL — ABNORMAL HIGH (ref 0.50–1.10)
Glucose, Bld: 106 mg/dL — ABNORMAL HIGH (ref 70–99)

## 2012-12-13 LAB — PROTHROMBIN GENE MUTATION

## 2012-12-13 LAB — SURGICAL PCR SCREEN
MRSA, PCR: NEGATIVE
Staphylococcus aureus: POSITIVE — AB

## 2012-12-13 LAB — FACTOR 5 LEIDEN

## 2012-12-13 MED ORDER — DIGOXIN 125 MCG PO TABS
0.1250 mg | ORAL_TABLET | Freq: Every day | ORAL | Status: DC
  Administered 2012-12-13 – 2012-12-17 (×5): 0.125 mg via ORAL
  Filled 2012-12-13 (×8): qty 1

## 2012-12-13 MED ORDER — ENSURE PUDDING PO PUDG
1.0000 | Freq: Three times a day (TID) | ORAL | Status: DC
  Administered 2012-12-13 – 2012-12-17 (×13): 1 via ORAL

## 2012-12-13 MED ORDER — TORSEMIDE 20 MG PO TABS
20.0000 mg | ORAL_TABLET | Freq: Every day | ORAL | Status: DC
  Administered 2012-12-14 – 2012-12-17 (×4): 20 mg via ORAL
  Filled 2012-12-13 (×4): qty 1

## 2012-12-13 NOTE — Progress Notes (Signed)
1 Day Post-Op Procedure(s) (LRB): RIGHT HEART CATH (N/A) Subjective: Class for CHF severe idiopathic cardiomyopathy Admitted with symptoms of low cardiac output Now on dobutamine with Swan PA catheter and improved hemodynamics Maintaining sinus rhythm PFTs show good pulmonary reserve Carotid Dopplers negative Chest CT without pulmonary nodules or adenopathy Right heart function appears to be adequate by echo and right heart hemodynamics Moderate to severe TR will probably need tricuspid annuloplasty with LVAD  Objective: Vital signs in last 24 hours: Temp:  [97.7 F (36.5 C)-99.1 F (37.3 C)] 98.2 F (36.8 C) (08/13 1230) Pulse Rate:  [77-98] 94 (08/13 1230) Cardiac Rhythm:  [-] Heart block (08/13 0800) Resp:  [11-27] 19 (08/13 1230) BP: (70-127)/(32-82) 127/72 mmHg (08/13 1230) SpO2:  [90 %-100 %] 100 % (08/13 1230) Weight:  [166 lb 3.6 oz (75.4 kg)] 166 lb 3.6 oz (75.4 kg) (08/13 0500)  Hemodynamic parameters for last 24 hours: PAP: (21-42)/(9-28) 37/24 mmHg CVP:  [1 mmHg-13 mmHg] 11 mmHg PCWP:  [8 mmHg] 8 mmHg CO:  [4 L/min] 4 L/min CI:  [2.2 L/min/m2] 2.2 L/min/m2  Intake/Output from previous day: 08/12 0701 - 08/13 0700 In: 890.4 [P.O.:180; I.V.:710.4] Out: 2875 [Urine:2875] Intake/Output this shift: Total I/O In: 433 [P.O.:240; I.V.:193] Out: -   Comfortable in the CCU Extremities warm Lungs clear Neuro intact  Lab Results: Creatinine improved to 1.4 Hypercoagulability study shows normal anti thrombinIII negative lupus anticoagulant and factor V 5 Leiden assay pending No results found for this basename: WBC, HGB, HCT, PLT,  in the last 72 hours BMET:  Recent Labs  12/12/12 1010 12/13/12 0500  NA 140 139  K 4.3 3.9  CL 105 105  CO2 24 25  GLUCOSE 119* 106*  BUN 18 17  CREATININE 1.41* 1.38*  CALCIUM 10.0 9.6    PT/INR: No results found for this basename: LABPROT, INR,  in the last 72 hours ABG    Component Value Date/Time   PHART 7.417 12/08/2012  1112   HCO3 24.7* 12/12/2012 1517   TCO2 26 12/12/2012 1517   ACIDBASEDEF 1.0 12/12/2012 1514   O2SAT 63.5 12/13/2012 0554   CBG (last 3)  No results found for this basename: GLUCAP,  in the last 72 hours  Assessment/Plan: S/P Procedure(s) (LRB): RIGHT HEART CATH (N/A) Plan implantable LVAD with combined tricuspid annuloplasty on Monday, August 18. Procedure has been discussed with patient including postop recovery.   LOS: 5 days    [name] III,PETER 12/13/2012

## 2012-12-13 NOTE — Progress Notes (Signed)
CSW met with patient, her daughters Sherry Ruffing and Joni Reining and her ex- husband Jonny Ruiz.  Social Work LVAD assessment was completed at bedside with patient and her family.  Patient and daughters were able to verbalize good understanding of LVAD process and future medical needs and ramifications.  Patient appears to have strong support; her 2 daughters will act as primary Agricultural engineer) and secondary Joni Reining).  Family has also asked patient's niece Tamika to be trained as a "back up" support person.  Patient was able to ask good questions and exhibited good coping mechanism as well as a very positive attitude. She was working full time at the [employer] and [employer] a desire to become independent again. She wants to return to work when she is recovered.  She finds solace and strength in her work and independence.  SW LVAD assessment will follow;  CSW recommends patient for LVAD implantation if medically indicated.    Lorri Frederick. [place]  [phone]

## 2012-12-13 NOTE — Consult Note (Signed)
Patient WU:[id number] C Jellison      DOB: 02-28-1946      NFA:[id number]     Consult Note from the Palliative Medicine Team at [facility]    Consult Requested by:  Dr Gala Romney     PCP: Evlyn Courier, MD Reason for Consultation:  Goals of Care and Preparedness Plan     Phone Number:[phone]  Assessment of patients Current state: Ms. 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 EF 15-25% with mild to moderate RV dysfunction.  Progressing disease, increased weakness, fatigue and dyspnea; plan is for LVAD placement       This NP Lorinda Creed reviewed medical records, received report from team, assessed the patient and then meet at the bedside with  Her two daughters Trena Platt and [name] to discuss advanced directives and a preparedness plan in light of scheduled LVAD implantation early next week  as destination therapy.  A detailed discussion was had today regarding the concept of a preparedness plan as it relates to LVAD therpy with intention of destination therapy treatment.    Patient and family were comfortable talking about the "what ifs and the importance of today's conversation so everyone can have all the information to be full participants and to understand the patient's basic beliefs and wishes as it relates to healthcare, quality of life.  Concepts specific to: -long term ventilation -artificial feeding and hydration -long term antibiotic use -dialysis -psychological adjustments -need to terminate the pump- -Other chronic or terminal disease unrelated to cardiac LVAD  Patient was able to verbalize to her family the importance of quality of life.  She is hopeful  that LVAD procedure will increase her qulaity of life.  She understands the risks and benefits of the procedure as it relates to her future.  At this time patient is open to all available medical interventions to prolong life and the success  of the LVAD therapy. She shared and verbalized an understanding that her family would know when the burdens of treatment would outweigh the benefits and trust in his family's support and wise decision at that time.  Patient and family were encouraged to continue conversation into the future as it is vital for the patient centered care.  Chaplain services offered and refused at this time.    Brief HPI: Ms. 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 EF 15-25% with mild to moderate RV dysfunction.  Progressing disease, increased weakness, fatigue and dyspnea; plan is for LVAD placement       ROS: weakness, fatigue SOB on minimal exertion    PMH:  Past Medical History  Diagnosis Date  . Paroxysmal supraventricular tachycardia   . Hypokalemia   . Chronic systolic heart failure     a. 05/3242 Echo: EF 15%, mod to sev antlat and inflat HK, inf AK, mild to mod MR, mildly/mod reduced RV fxn, Mod TR, PASP .  [name] History of DVT (deep vein thrombosis)   . Dyslipidemia   . HTN (hypertension)   . Nonischemic cardiomyopathy     a. 06/2012 Echo: EF 15%;  b. 06/2012 Cath: nl except luminal irregs in LAD.  [name] Ventricular tachycardia     a. s/p ICD  . Goiter   . Implantable cardiac defibrillator- Biotronik     Single chamber  . [name] (left bundle branch block)  intermittent  . Palliative care patient      PSH: Past Surgical History  Procedure Laterality Date  . None    . Cardiac defibrillator placement     I have reviewed the FH and SH and  If appropriate update it with new information. No Known Allergies Scheduled Meds: . amiodarone  200 mg Oral BID  . aspirin  81 mg Oral Daily  . atorvastatin  20 mg Oral q1800  . digoxin  0.125 mg Oral Daily  . feeding supplement  1 Container Oral TID WC  . heparin  5,000 Units Subcutaneous Q8H  . hydrALAZINE  10 mg Oral Q8H  . isosorbide mononitrate  15 mg Oral Daily  . potassium  chloride  20 mEq Oral Daily  . sodium chloride  3 mL Intravenous Q12H  . sodium chloride  3 mL Intravenous Q12H  . sodium chloride  3 mL Intravenous Q12H  . [START ON 12/14/2012] torsemide  20 mg Oral Daily   Continuous Infusions: . sodium chloride 10 mL/hr at 12/13/12 1200  . DOBUTamine 4.019 mcg/kg/min (12/13/12 1200)   PRN Meds:.sodium chloride, sodium chloride, sodium chloride, acetaminophen, acetaminophen, ondansetron (ZOFRAN) IV, ondansetron (ZOFRAN) IV, sodium chloride, sodium chloride, sodium chloride, sodium chloride, zolpidem    BP 127/72  Pulse 94  Temp(Src) 98.2 F (36.8 C) (Oral)  Resp 19  Ht 5\' 3"  (1.6 m)  Wt 75.4 kg (166 lb 3.6 oz)  BMI 29.45 kg/m2  SpO2 100%      Intake/Output Summary (Last 24 hours) at 12/13/12 1530 Last data filed at 12/13/12 1200  Gross per 24 hour  Intake 1155.6 ml  Output   1775 ml  Net -619.4 ml    Physical Exam:  General: Chronically ill appearing, engaged in conversation HEENT:  Mm, no exudate Chest:   CTA CVS: + JVP, RRR + murmur Abdomen:soft NT +BS Ext: trace edema BLE Neuro: alert and oriented X3  Labs: CBC    Component Value Date/Time   WBC 7.3 12/08/2012 1653   RBC 3.41* 12/08/2012 1653   HGB 10.9* 12/08/2012 1653   HCT 32.4* 12/08/2012 1653   PLT 226 12/08/2012 1653   MCV 95.0 12/08/2012 1653   MCH 32.0 12/08/2012 1653   MCHC 33.6 12/08/2012 1653   RDW 14.3 12/08/2012 1653   LYMPHSABS 2.6 07/04/2012 1221   MONOABS 0.8 07/04/2012 1221   EOSABS 0.1 07/04/2012 1221   BASOSABS 0.0 07/04/2012 1221    BMET    Component Value Date/Time   NA 139 12/13/2012 0500   K 3.9 12/13/2012 0500   CL 105 12/13/2012 0500   CO2 25 12/13/2012 0500   GLUCOSE 106* 12/13/2012 0500   BUN 17 12/13/2012 0500   CREATININE 1.38* 12/13/2012 0500   CALCIUM 9.6 12/13/2012 0500   GFRNONAA 39* 12/13/2012 0500   GFRAA 45* 12/13/2012 0500    CMP     Component Value Date/Time   NA 139 12/13/2012 0500   K 3.9 12/13/2012 0500   CL 105 12/13/2012 0500   CO2 25  12/13/2012 0500   GLUCOSE 106* 12/13/2012 0500   BUN 17 12/13/2012 0500   CREATININE 1.38* 12/13/2012 0500   CALCIUM 9.6 12/13/2012 0500   PROT 6.7 06/26/2012 0523   ALBUMIN 3.5 06/26/2012 0523   AST 51* 06/26/2012 0523   ALT 207* 06/26/2012 0523   ALKPHOS 99 06/26/2012 0523   BILITOT 0.7 06/26/2012 0523   GFRNONAA 39* 12/13/2012 0500   GFRAA 45* 12/13/2012 0500  Time In Time Out Total Time Spent with Patient Total Overall Time  1300 1415 70 min 75 min    Greater than 50%  of this time was spent counseling and coordinating care related to the above assessment and plan.  Lorinda Creed NP  Palliative Medicine Team Team Phone # [phone] Pager [phone]  Discussed with Lovette Cliche LCSW

## 2012-12-13 NOTE — Progress Notes (Signed)
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 EF 15-25% with mild to moderate RV dysfunction  Cath yesterday with improved hemodynamics but still low output on dobutamine. Revatio started due to PVR = 5 but SBP dropped to 70 and required IVF. Now feels better. SBP 110.   CVP 11-12 PCWP 8 (checked personally) co-ox 64%   PRE VAD TESTS Done   Blood Typing- A positive Done  Doppler PreCABG in EPIC (Carotid Dopplers and ABIs) ______ PFTs (Full with DLCO) Done   TSH, Hepatitis Panel, HIV Done   Chest/Abdominal CT if previous chest or abdominal surgery NA  Venous Dopplers if suspect DVT NA  Panorex (teeth questionable) Done 12/08/12   Chest X-Ray (2 view) ______ Abdominal US for AAA screen if > 9 yrs old or presence of PAD ______ Social Work and Palliative Consult (Pre-VAD workup in comments) Done 12/09/12   ECHO with EF documented <25%    Past Medical History  Diagnosis Date  . Paroxysmal supraventricular tachycardia   . Hypokalemia   . Chronic systolic heart failure     a. 05/6107 Echo: EF 15%, mod to sev antlat and inflat HK, inf AK, mild to mod MR, mildly/mod reduced RV fxn, Mod TR, PASP .  [name] History of DVT (deep vein thrombosis)   . Dyslipidemia   . HTN (hypertension)   . Nonischemic cardiomyopathy     a. 06/2012 Echo: EF 15%;  b. 06/2012 Cath: nl except luminal irregs in LAD.  [name] Ventricular tachycardia     a. s/p ICD  . Goiter   . Implantable cardiac defibrillator- Biotronik     Single chamber  . [name] (left bundle branch block)     intermittent  . Palliative care patient     Current Facility-Administered Medications  Medication Dose Route Frequency Provider Last Rate Last Dose  . 0.9 %  sodium chloride infusion  250 mL Intravenous PRN Dolores Patty, MD 20 mL/hr at 12/12/12 1600 250 mL at 12/12/12 1600  . 0.9 %  sodium chloride infusion   Intravenous Continuous Dolores Patty, MD 10 mL/hr at 12/12/12 1600    . 0.9 %  sodium chloride infusion  250 mL Intravenous PRN [name] D [name], NP      . 0.9 %  sodium chloride infusion  250 mL Intravenous PRN Dolores Patty, MD      . acetaminophen (TYLENOL) tablet 650 mg  650 mg Oral Q4H PRN Dolores Patty, MD   650 mg at 12/12/12 1815  . acetaminophen (TYLENOL) tablet 650 mg  650 mg Oral Q4H PRN Dolores Patty, MD      . amiodarone (PACERONE) tablet 200 mg  200 mg Oral BID Sherald Hess, NP   200 mg at 12/12/12 2259  . aspirin chewable tablet 81 mg  81 mg Oral Daily [name] D [name], NP   81 mg at 12/12/12 1012  . atorvastatin (LIPITOR) tablet 20 mg  20 mg Oral q1800 Dolores Patty, MD   20 mg at 12/12/12 1751  . DOBUTamine (DOBUTREX) infusion 4000 mcg/mL  4 mcg/kg/min Intravenous Titrated Dolores Patty, MD 4.6 mL/hr at 12/13/12 0357 4 mcg/kg/min at 12/13/12 0357  . heparin injection 5,000 Units  5,000 Units Subcutaneous Q8H Dolores Patty, MD   5,000 Units at 12/13/12 0544  . hydrALAZINE (APRESOLINE) tablet 10 mg  10 mg Oral Q8H  Sherald Hess, NP   10 mg at 12/13/12 0544  . isosorbide mononitrate (IMDUR) 24 hr tablet 15 mg  15 mg Oral Daily [name] D [name], NP   15 mg at 12/12/12 1014  . ondansetron (ZOFRAN) injection 4 mg  4 mg Intravenous Q6H PRN Dolores Patty, MD      . ondansetron [facility]) injection 4 mg  4 mg Intravenous Q6H PRN Dolores Patty, MD      . potassium chloride (K-DUR) CR tablet 20 mEq  20 mEq Oral Daily [name] D [name], NP   20 mEq at 12/12/12 1013  . sildenafil (REVATIO) tablet 20 mg  20 mg Oral TID Dolores Patty, MD   20 mg at 12/12/12 1751  . sodium chloride 0.9 % injection 10-40 mL  10-40 mL Intracatheter PRN Dolores Patty, MD   10 mL at 12/09/12 2150  . sodium chloride 0.9 % injection 3 mL  3 mL Intravenous Q12H Dolores Patty, MD   3 mL at 12/12/12 1011  . sodium chloride 0.9 % injection 3 mL  3 mL Intravenous PRN Dolores Patty, MD      . sodium chloride 0.9 %  injection 3 mL  3 mL Intravenous Q12H [name] D [name], NP   3 mL at 12/12/12 2300  . sodium chloride 0.9 % injection 3 mL  3 mL Intravenous PRN [name] D [name], NP      . sodium chloride 0.9 % injection 3 mL  3 mL Intravenous Q12H Dolores Patty, MD   3 mL at 12/12/12 2300  . sodium chloride 0.9 % injection 3 mL  3 mL Intravenous PRN Dolores Patty, MD      . zolpidem [facility]) tablet 5 mg  5 mg Oral QHS PRN Dolores Patty, MD   5 mg at 12/11/12 2223    PHYSICAL EXAM: Filed Vitals:   12/13/12 0409 12/13/12 0430 12/13/12 0500 12/13/12 0530  BP:  111/67 111/68 111/62  Pulse:  90 88 90  Temp: 98.9 F (37.2 C) 98.8 F (37.1 C) 98.8 F (37.1 C) 99.1 F (37.3 C)  TempSrc: Oral     Resp:  19 20 21   Height:      Weight:   75.4 kg (166 lb 3.6 oz)   SpO2: 90% 100% 98% 100%   CVP ~11 General: Fatigued appearing. Mildly dyspneic HEENT: normal Neck: supple. JVP up Carotids 2+ bilat; no bruits. No lymphadenopathy or thryomegaly appreciated. Cor: PMI nonpalpable. Distant HS Regular rate & rhythm. 2/6 TR. +s3 Lungs: clear Abdomen: soft, nontender, nondistended. No hepatosplenomegaly. No bruits or masses. Good bowel sounds. Extremities: no cyanosis, clubbing, rash, tr edema Bilateral ted hose  Neuro: alert & oriented x 3, cranial nerves grossly intact. moves all 4 extremities w/o difficulty. Affect pleasant.   ASSESSMENT:  1. Acute on chronic systolic HF with biventricular HF EF 10%  2. Cardiogenic shock 3. VT s/p Biotronik ICD 4. Chronic renal failure  5. [name] 6. SVT s/p ablation  7. Blood type A pos   PLAN/DISCUSSION:   She is improved with dobutamine support but still tenuous. CVP borderline. Will hold diuretics today. Restart torsemide tomorrow am. No ACE/ARB due to renal failure. Off b-block due to dobutamine. Will start digoxin 0.125 daily.   I discussed case with Dr. Jory Ee (director of Transplant at [facility]) yesterday who agrees that she will likely not be stable enough to  wait for transplant and will likely need VAD in near future  if RV function will tolerate. Info faxed to their transplant team to review.  Would like to have her meet VAD patient (likely current one in house) tomorrow. SW to see today.   The patient is critically ill with multiple organ systems failure and requires high complexity decision making for assessment and support, frequent evaluation and titration of therapies, application of advanced monitoring technologies and extensive interpretation of multiple databases.   Critical Care Time devoted to patient care services described in this note is 35 Minutes.  Daniel Bensimhon,MD 6:23 AM

## 2012-12-14 ENCOUNTER — Inpatient Hospital Stay (HOSPITAL_COMMUNITY): Payer: Commercial Managed Care - PPO

## 2012-12-14 DIAGNOSIS — I5023 Acute on chronic systolic (congestive) heart failure: Secondary | ICD-10-CM

## 2012-12-14 DIAGNOSIS — I428 Other cardiomyopathies: Secondary | ICD-10-CM

## 2012-12-14 DIAGNOSIS — Z515 Encounter for palliative care: Secondary | ICD-10-CM

## 2012-12-14 DIAGNOSIS — R531 Weakness: Secondary | ICD-10-CM | POA: Diagnosis present

## 2012-12-14 LAB — BASIC METABOLIC PANEL
BUN: 16 mg/dL (ref 6–23)
Calcium: 9.7 mg/dL (ref 8.4–10.5)
Creatinine, Ser: 1.27 mg/dL — ABNORMAL HIGH (ref 0.50–1.10)
GFR calc Af Amer: 50 mL/min — ABNORMAL LOW (ref >90)
GFR calc non Af Amer: 43 mL/min — ABNORMAL LOW (ref >90)
Glucose, Bld: 95 mg/dL (ref 70–99)

## 2012-12-14 LAB — HEMOGLOBIN A1C
Hgb A1c MFr Bld: 6.1 % — ABNORMAL HIGH (ref <5.7)
Mean Plasma Glucose: 128 mg/dL — ABNORMAL HIGH (ref <117)

## 2012-12-14 LAB — PREALBUMIN: Prealbumin: 17.1 mg/dL — ABNORMAL LOW (ref 17.0–34.0)

## 2012-12-14 MED ORDER — DIPHENHYDRAMINE HCL 25 MG PO CAPS
25.0000 mg | ORAL_CAPSULE | Freq: Every evening | ORAL | Status: DC | PRN
  Administered 2012-12-14: 25 mg via ORAL
  Filled 2012-12-14: qty 1

## 2012-12-14 MED ORDER — MUPIROCIN CALCIUM 2 % EX CREA
TOPICAL_CREAM | Freq: Two times a day (BID) | CUTANEOUS | Status: AC
  Administered 2012-12-14 – 2012-12-16 (×5): via TOPICAL
  Administered 2012-12-16 – 2012-12-17 (×2): 2 via TOPICAL
  Administered 2012-12-17: 22:00:00 via TOPICAL
  Filled 2012-12-14: qty 15

## 2012-12-14 MED ORDER — MILRINONE IN DEXTROSE 20 MG/100ML IV SOLN
0.0625 ug/kg/min | INTRAVENOUS | Status: DC
  Administered 2012-12-14: 0.125 ug/kg/min via INTRAVENOUS
  Filled 2012-12-14 (×2): qty 100

## 2012-12-14 MED ORDER — POLYETHYLENE GLYCOL 3350 17 G PO PACK
17.0000 g | PACK | Freq: Every day | ORAL | Status: DC
  Administered 2012-12-14 – 2012-12-17 (×4): 17 g via ORAL
  Filled 2012-12-14 (×7): qty 1

## 2012-12-14 NOTE — Progress Notes (Addendum)
Patient ID: Kaitlin [name], female   DOB: 03-May-1946, 67 y.o.   MRN: [id number] 2 Days Post-Op Procedure(s) (LRB): RIGHT HEART CATH (N/A) Subjective: Patient examined, chest x-ray reviewed as well as pulmonary artery catheter hemodynamic data. 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  Surgical PCR screen positive for MSSA so we'll start on topical Bactroban preop   Admitted with symptoms of low cardiac output Now on dobutamine and low-dose milrinone with Swan PA catheter monitoring  Maintaining sinus rhythm PFTs show good pulmonary reserve Carotid Dopplers negative Chest CT without pulmonary nodules or adenopathy Right heart function appears to be adequate by echo and right heart hemodynamics Moderate to severe TR will probably need tricuspid annuloplasty with LVAD CT scan of abdomen is pending Objective: Vital signs in last 24 hours: Temp:  [98.2 F (36.8 C)-99.3 F (37.4 C)] 98.2 F (36.8 C) (08/14 1630) Pulse Rate:  [61-113] 93 (08/14 1630) Cardiac Rhythm:  [-] Heart block (08/14 0800) Resp:  [10-33] 17 (08/14 1630) BP: (88-131)/(46-99) 105/68 mmHg (08/14 1630) SpO2:  [95 %-100 %] 98 % (08/14 1630) Weight:  [168 lb 3.4 oz (76.3 kg)] 168 lb 3.4 oz (76.3 kg) (08/14 0500)  Hemodynamic parameters for last 24 hours: PAP: (31-39)/(12-19) 36/12 mmHg CVP:  [6 mmHg-13 mmHg] 7 mmHg PCWP:  [10 mmHg] 10 mmHg CO:  [3 L/min-7 L/min] 7 L/min CI:  [1.7 L/min/m2-1.8 L/min/m2] 1.7 L/min/m2  Intake/Output from previous day: 08/13 0701 - 08/14 0700 In: 1573.4 [P.O.:720; I.V.:853.4] Out: 1325  [Urine:1325] Intake/Output this shift: Total I/O In: 346 [P.O.:240; I.V.:106] Out: 1625 [Urine:1625]  Comfortable in the CCU Extremities warm Lungs clear Neuro intact  Lab Results: Creatinine improved to 1.4 Hypercoagulability study shows normal anti thrombi nIII negative lupus anticoagulant and factor V 5 Leiden assay pending No results found for this basename: WBC, HGB, HCT, PLT,  in the last 72 hours BMET: Creatinine improved to 1.27  Recent Labs  12/13/12 0500 12/14/12 0500  NA 139 140  K 3.9 4.2  CL 105 108  CO2 25 23  GLUCOSE 106* 95  BUN 17 16  CREATININE 1.38* 1.27*  CALCIUM 9.6 9.7    PT/INR: No results found for this basename: LABPROT, INR,  in the last 72 hours ABG    Component Value Date/Time   PHART 7.417 12/08/2012 1112   HCO3 24.7* 12/12/2012 1517   TCO2 26 12/12/2012 1517   ACIDBASEDEF 1.0 12/12/2012 1514   O2SAT 66.3 12/14/2012 0450   CBG (last 3)  No results found for this basename: GLUCAP,  in the last 72 hours  Assessment/Plan: S/P Procedure(s) (LRB): RIGHT HEART CATH (N/A) Plan implantable LVAD with combined tricuspid annuloplasty on Monday, August 18. Procedure has been discussed with patient including postop recovery. The patient and her family understand the indications and expected benefits of LVAD implantation for destination therapy as well as the potential risks and alternatives.  LOS: 6 days    [name] III,Jerred Zaremba 12/14/2012

## 2012-12-14 NOTE — Progress Notes (Signed)
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 EF 15-25% with mild to moderate RV dysfunction  Cath 8/12 with improved hemodynamics but still low output on dobutamine. Revatio started due to PVR = 5 but SBP dropped to 70 and required IVF.  Cr down to 1.27. Weight up 2 pounds. Feels much better. No dyspnea, orthopnea or PND.  Last recorded Swan numbers: CVP 6 PAP 36/13 PCWP 10 CO/CI 3.0/1.7 PVR 266 SVR 2072 Co-ox 66%  CVP checked personally this am and it was 12-13.   PRE VAD TESTS Done   Blood Typing- A positive Done  Doppler PreCABG in EPIC (Carotid Dopplers and ABIs) ______ PFTs (Full with DLCO) Done   TSH, Hepatitis Panel, HIV Done   Chest/Abdominal CT if previous chest or abdominal surgery NA  Venous Dopplers if suspect DVT NA  Panorex (teeth questionable) Done 12/08/12   Chest X-Ray (2 view) ______ Abdominal US for AAA screen if > 74 yrs old or presence of PAD ______ Social Work and Palliative Consult (Pre-VAD workup in comments) Done 12/09/12   ECHO with EF documented <25%    Past Medical History  Diagnosis Date  . Paroxysmal supraventricular tachycardia   . Hypokalemia   . Chronic systolic heart failure     a. 08/979 Echo: EF 15%, mod to sev antlat and inflat HK, inf AK, mild to mod MR, mildly/mod reduced RV fxn, Mod TR, PASP .  [name] History of DVT (deep vein thrombosis)   . Dyslipidemia   . HTN (hypertension)   . Nonischemic cardiomyopathy     a. 06/2012 Echo: EF 15%;  b. 06/2012 Cath: nl except luminal irregs in LAD.  [name] Ventricular tachycardia     a. s/p ICD  . Goiter   . Implantable cardiac defibrillator- Biotronik     Single chamber  . [name] (left bundle branch block)     intermittent  . Palliative care patient     Current Facility-Administered Medications  Medication Dose Route Frequency Provider Last Rate Last Dose  . 0.9 %  sodium chloride infusion  250 mL  Intravenous PRN Dolores Patty, MD 20 mL/hr at 12/14/12 0700 250 mL at 12/14/12 0700  . 0.9 %  sodium chloride infusion   Intravenous Continuous Dolores Patty, MD 10 mL/hr at 12/14/12 0700    . 0.9 %  sodium chloride infusion  250 mL Intravenous PRN Dolores Patty, MD      . acetaminophen (TYLENOL) tablet 650 mg  650 mg Oral Q4H PRN Dolores Patty, MD   650 mg at 12/13/12 1429  . acetaminophen (TYLENOL) tablet 650 mg  650 mg Oral Q4H PRN Dolores Patty, MD      . amiodarone (PACERONE) tablet 200 mg  200 mg Oral BID Sherald Hess, NP   200 mg at 12/13/12 2251  . aspirin chewable tablet 81 mg  81 mg Oral Daily [name] D [name], NP   81 mg at 12/13/12 1025  . atorvastatin (LIPITOR) tablet 20 mg  20 mg Oral q1800 Dolores Patty, MD   20 mg at 12/13/12 1810  . digoxin (LANOXIN) tablet 0.125 mg  0.125 mg Oral Daily Dolores Patty, MD   0.125 mg at 12/13/12 1025  . DOBUTamine (DOBUTREX) infusion 4000 mcg/mL  4 mcg/kg/min Intravenous Titrated Dolores Patty, MD 4.6 mL/hr at 12/14/12 0700 4.019 mcg/kg/min at 12/14/12 0700  .  feeding supplement (ENSURE) pudding 1 Container  1 Container Oral TID WC Kerin Perna, MD   1 Container at 12/13/12 1700  . heparin injection 5,000 Units  5,000 Units Subcutaneous Q8H Dolores Patty, MD   5,000 Units at 12/14/12 4098  . hydrALAZINE (APRESOLINE) tablet 10 mg  10 mg Oral Q8H [name] D [name], NP   10 mg at 12/14/12 0629  . isosorbide mononitrate (IMDUR) 24 hr tablet 15 mg  15 mg Oral Daily [name] D [name], NP   15 mg at 12/13/12 1025  . ondansetron (ZOFRAN) injection 4 mg  4 mg Intravenous Q6H PRN Dolores Patty, MD      . ondansetron [facility]) injection 4 mg  4 mg Intravenous Q6H PRN Dolores Patty, MD      . potassium chloride (K-DUR) CR tablet 20 mEq  20 mEq Oral Daily [name] D [name], NP   20 mEq at 12/13/12 1025  . sodium chloride 0.9 % injection 10-40 mL  10-40 mL Intracatheter PRN Dolores Patty, MD   10 mL at 12/13/12 1032  . sodium  chloride 0.9 % injection 3 mL  3 mL Intravenous Q12H Dolores Patty, MD   3 mL at 12/13/12 2253  . sodium chloride 0.9 % injection 3 mL  3 mL Intravenous PRN Dolores Patty, MD      . sodium chloride 0.9 % injection 3 mL  3 mL Intravenous Q12H Dolores Patty, MD   3 mL at 12/13/12 2253  . sodium chloride 0.9 % injection 3 mL  3 mL Intravenous PRN Dolores Patty, MD      . torsemide [facility]) tablet 20 mg  20 mg Oral Daily Dolores Patty, MD      . zolpidem (AMBIEN) tablet 5 mg  5 mg Oral QHS PRN Dolores Patty, MD   5 mg at 12/11/12 2223    PHYSICAL EXAM: Filed Vitals:   12/14/12 0400 12/14/12 0500 12/14/12 0600 12/14/12 0725  BP: 110/66 114/63 121/76   Pulse: 94 95 102   Temp: 99.3 F (37.4 C) 99.3 F (37.4 C) 99 F (37.2 C) 98.3 F (36.8 C)  TempSrc: Core (Comment)   Oral  Resp: 26 18 23    Height:      Weight:  76.3 kg (168 lb 3.4 oz)    SpO2: 98% 100% 99%    CVP ~12-13 General: Fatigued appearing. Mildly dyspneic HEENT: normal Neck: supple. JVP up Carotids 2+ bilat; no bruits. No lymphadenopathy or thryomegaly appreciated. Cor: PMI nonpalpable. Distant HS Regular rate & rhythm. 2/6 TR. +s3 Lungs: clear Abdomen: soft, nontender, nondistended. No hepatosplenomegaly. No bruits or masses. Good bowel sounds. Extremities: no cyanosis, clubbing, rash, tr edema Bilateral ted hose  Neuro: alert & oriented x 3, cranial nerves grossly intact. moves all 4 extremities w/o difficulty. Affect pleasant.   ASSESSMENT:  1. Acute on chronic systolic HF with biventricular HF EF 10%  2. Cardiogenic shock 3. VT s/p Biotronik ICD 4. Chronic renal failure  5. [name] 6. SVT s/p ablation  7. Blood type A pos   PLAN/DISCUSSION:   She is improved with dobutamine support but still tenuous. CVP remain markedly elevated. She was unable to tolerate milrinone or sildenafil due to hypotension. I am worried about her RV. Will start low-dose milrinone and see if she can tolerate  with dobutamine on board. Restart low-dose diuretics. No ACE/ARB due to renal failure. Off b-block due to dobutamine.   I discussed case with  Dr. Jory Ee (director of Transplant at [facility]) who agrees that she will likely not be stable enough to wait for transplant and will likely need VAD soon if RV function will tolerate. Info faxed to their transplant team for further review.  Appreciate SW eval.   The patient is critically ill with multiple organ systems failure and requires high complexity decision making for assessment and support, frequent evaluation and titration of therapies, application of advanced monitoring technologies and extensive interpretation of multiple databases.   Critical Care Time devoted to patient care services described in this note is 35 Minutes.  Kaitlin Sparano,MD 8:23 AM

## 2012-12-15 DIAGNOSIS — Z515 Encounter for palliative care: Secondary | ICD-10-CM

## 2012-12-15 LAB — CBC
HCT: 33.1 % — ABNORMAL LOW (ref 36.0–46.0)
Hemoglobin: 11 g/dL — ABNORMAL LOW (ref 12.0–15.0)
MCH: 31.2 pg (ref 26.0–34.0)
MCHC: 33.2 g/dL (ref 30.0–36.0)
RDW: 14.1 % (ref 11.5–15.5)

## 2012-12-15 LAB — BASIC METABOLIC PANEL
BUN: 22 mg/dL (ref 6–23)
Chloride: 103 mEq/L (ref 96–112)
GFR calc Af Amer: 39 mL/min — ABNORMAL LOW (ref >90)
GFR calc non Af Amer: 34 mL/min — ABNORMAL LOW (ref >90)
Potassium: 4.3 mEq/L (ref 3.5–5.1)

## 2012-12-15 LAB — DIGOXIN LEVEL: Digoxin Level: 0.4 ng/mL — ABNORMAL LOW (ref 0.8–2.0)

## 2012-12-15 LAB — CARBOXYHEMOGLOBIN
Carboxyhemoglobin: 1.4 % (ref 0.5–1.5)
Methemoglobin: 1.4 % (ref 0.0–1.5)
Total hemoglobin: 11.2 g/dL — ABNORMAL LOW (ref 12.0–16.0)

## 2012-12-15 MED ORDER — ALPRAZOLAM 0.25 MG PO TABS: 0.2500 mg | ORAL_TABLET | Freq: Four times a day (QID) | ORAL | Status: DC

## 2012-12-15 MED ORDER — ALPRAZOLAM 0.5 MG PO TABS
0.5000 mg | ORAL_TABLET | Freq: Four times a day (QID) | ORAL | Status: DC
  Administered 2012-12-15 – 2012-12-18 (×12): 0.5 mg via ORAL
  Filled 2012-12-15 (×12): qty 1

## 2012-12-15 MED ORDER — SORBITOL 70 % SOLN
30.0000 mL | Freq: Once | Status: AC
  Administered 2012-12-15: 30 mL via ORAL
  Filled 2012-12-15: qty 30

## 2012-12-15 NOTE — Evaluation (Signed)
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 EF 15-25% with mild to moderate RV dysfunction.  Plans for LVAD 12/18/12.   Clinical Impression  Pt currently moving well however limited due to lines during evaluation.  Plan for LVAD 12/18/12.  Therefore will further assess overall needs post sx.  Pt will benefit from acute PT services to improve overall mobility and prepare for safe d/c home with dtr.  Anticipate RW and HHPT at d/c however need to further assess post sx.     PT Assessment  Patient needs continued PT services    Follow Up Recommendations   (Will further determine post sx)    Equipment Recommendations  Rolling walker with 5" wheels (will continue to assess post sx)    Frequency Min 3X/week    Precautions / Restrictions Precautions Precautions: None Restrictions Weight Bearing Restrictions: No   Pertinent Vitals/Pain No c/o pain      Mobility  Bed Mobility Bed Mobility: Supine to Sit Supine to Sit: 5: Supervision;With rails Details for Bed Mobility Assistance: Supervision for safety with cues for technique Transfers Transfers: Sit to Stand;Stand to Sit Sit to Stand: 4: Min guard;From bed Stand to Sit: 4: Min guard;To chair/3-in-1 Details for Transfer Assistance: Minguard for safety wtih cues for hand placement Ambulation/Gait Ambulation/Gait Assistance: 4: Min guard Ambulation Distance (Feet): 20 Feet (10 x 2) Assistive device: None Ambulation/Gait Assistance Details: minguard for safety; limited distance due to lines Gait Pattern: Step-through pattern;Decreased stride length;Shuffle Gait velocity: decreased Stairs: No    Exercises     PT Diagnosis:  Difficulty walking  PT Problem List: Decreased activity tolerance;Decreased balance;Decreased mobility;Cardiopulmonary status limiting activity PT Treatment Interventions: DME instruction;Gait training;Stair training;Functional mobility training;Therapeutic activities;Therapeutic exercise;Balance training;Patient/family education     PT Goals(Current goals can be found in the care plan section) Acute Rehab PT Goals Patient Stated Goal: To go home PT Goal Formulation: With patient Time For Goal Achievement: 12/29/12 Potential to Achieve Goals: Good  Visit Information  Last PT Received On: 12/15/12 History of Present Illness: Kaitlin [name] is a 67 y/o woman with h/o severe HTN, [name], VT s/p Biotronik ICD (2009) and 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 EF 15-25% with mild to moderate RV dysfunction.  Plans for LVAD 12/18/12.        Prior Functioning  Home Living Family/patient expects to be discharged to:: Private residence Living Arrangements: Children Available Help at Discharge: Family;Available 24 hours/day Type of Home: House Home Access: Stairs to enter [employer] of Steps: 5 Entrance Stairs-Rails: Can reach both Home Layout: One level Home Equipment: None Prior Function Level of Independence: Independent Communication Communication: No difficulties Dominant Hand: Right    Cognition  Cognition Arousal/Alertness: Awake/alert Behavior During Therapy: WFL for tasks assessed/performed Overall Cognitive Status: Within Functional Limits for tasks assessed    Extremity/Trunk Assessment Lower Extremity Assessment Lower Extremity Assessment: Overall WFL for tasks assessed   Balance Balance Balance Assessed: Yes Static Standing Balance Static Standing - Balance Support: No upper extremity supported Static Standing - Level of Assistance: 5: Stand by assistance  End of Session PT - End of Session Equipment Utilized During Treatment: Gait belt Activity Tolerance: Patient tolerated  treatment well Patient left: in chair;with call bell/phone  within reach Nurse Communication: Mobility status  GP     Joeanthony Seeling 12/15/2012, 1:24 PM [place], PT DPT [phone]

## 2012-12-15 NOTE — Progress Notes (Addendum)
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 EF 15-25% with mild to moderate RV dysfunction  Cath 8/12 with improved hemodynamics but still low output on dobutamine. Revatio started due to PVR = 5 but SBP dropped to 70 and required IVF.  Yesterday started on milrinone due to high CVPs and concern over RV dysfunction. Also received demadex. Feels much better. I/O - 1.3L Weight stable. Again with increasing ectopy.   Cr up from 1.27->1.56. Co-ox 63%  Ab CT normal except for high stool volume.   Denies SOB, orthopnea or PND.   CVP checked personally 11-12 very prominent V waves PCWP ~20   PRE VAD TESTS Done   Blood Typing- A positive Done  Doppler PreCABG in EPIC (Carotid Dopplers and ABIs) ______ PFTs (Full with DLCO) Done   TSH, Hepatitis Panel, HIV Done   Chest/Abdominal CT if previous chest or abdominal surgery NA  Venous Dopplers if suspect DVT NA  Panorex (teeth questionable) Done 12/08/12   Chest X-Ray (2 view) ______ Abdominal US for AAA screen if > 69 yrs old or presence of PAD ______ Social Work and Palliative Consult (Pre-VAD workup in comments) Done 12/09/12   ECHO with EF documented <25%    Past Medical History  Diagnosis Date  . Paroxysmal supraventricular tachycardia   . Hypokalemia   . Chronic systolic heart failure     a. 05/6107 Echo: EF 15%, mod to sev antlat and inflat HK, inf AK, mild to mod MR, mildly/mod reduced RV fxn, Mod TR, PASP .  [name] History of DVT (deep vein thrombosis)   . Dyslipidemia   . HTN (hypertension)   . Nonischemic cardiomyopathy     a. 06/2012 Echo: EF 15%;  b. 06/2012 Cath: nl except luminal irregs in LAD.  [name] Ventricular tachycardia     a. s/p ICD  . Goiter   . Implantable cardiac defibrillator- Biotronik     Single chamber  . [name] (left bundle branch block)     intermittent  . Palliative care patient     Current Facility-Administered  Medications  Medication Dose Route Frequency Provider Last Rate Last Dose  . 0.9 %  sodium chloride infusion  250 mL Intravenous PRN Dolores Patty, MD 20 mL/hr at 12/14/12 2000 250 mL at 12/14/12 2000  . 0.9 %  sodium chloride infusion   Intravenous Continuous Dolores Patty, MD 10 mL/hr at 12/14/12 2000    . acetaminophen (TYLENOL) tablet 650 mg  650 mg Oral Q4H PRN Dolores Patty, MD   650 mg at 12/14/12 2342  . amiodarone (PACERONE) tablet 200 mg  200 mg Oral BID Sherald Hess, NP   200 mg at 12/14/12 2108  . aspirin chewable tablet 81 mg  81 mg Oral Daily [name] D [name], NP   81 mg at 12/14/12 1012  . atorvastatin (LIPITOR) tablet 20 mg  20 mg Oral q1800 Dolores Patty, MD   20 mg at 12/14/12 1624  . digoxin (LANOXIN) tablet 0.125 mg  0.125 mg Oral Daily Dolores Patty, MD   0.125 mg at 12/14/12 1012  . diphenhydrAMINE (BENADRYL) capsule 25 mg  25 mg Oral QHS PRN Dolores Patty, MD   25 mg at 12/14/12 2342  . DOBUTamine (DOBUTREX) infusion 4000 mcg/mL  4 mcg/kg/min Intravenous Titrated Dolores Patty, MD 4.6 mL/hr at 12/14/12 2000 4 mcg/kg/min at 12/14/12 2000  .  feeding supplement (ENSURE) pudding 1 Container  1 Container Oral TID WC Kerin Perna, MD   1 Container at 12/15/12 0751  . heparin injection 5,000 Units  5,000 Units Subcutaneous Q8H Dolores Patty, MD   5,000 Units at 12/15/12 [phone]  . hydrALAZINE (APRESOLINE) tablet 10 mg  10 mg Oral Q8H [name] D [name], NP   10 mg at 12/15/12 9604  . isosorbide mononitrate (IMDUR) 24 hr tablet 15 mg  15 mg Oral Daily [name] D [name], NP   15 mg at 12/14/12 1012  . milrinone (PRIMACOR) infusion 200 mcg/mL (0.2 mg/ml)  0.125 mcg/kg/min Intravenous Continuous Dolores Patty, MD 2.9 mL/hr at 12/14/12 2000 0.125 mcg/kg/min at 12/14/12 2000  . mupirocin cream (BACTROBAN) 2 %   Topical BID Kerin Perna, MD      . ondansetron [facility]) injection 4 mg  4 mg Intravenous Q6H PRN Dolores Patty, MD      . polyethylene glycol  (MIRALAX / GLYCOLAX) packet 17 g  17 g Oral Daily Dolores Patty, MD   17 g at 12/14/12 1624  . potassium chloride (K-DUR) CR tablet 20 mEq  20 mEq Oral Daily [name] D [name], NP   20 mEq at 12/14/12 1012  . sodium chloride 0.9 % injection 10-40 mL  10-40 mL Intracatheter PRN Dolores Patty, MD   20 mL at 12/14/12 1017  . sodium chloride 0.9 % injection 3 mL  3 mL Intravenous Q12H Dolores Patty, MD   3 mL at 12/14/12 2111  . sodium chloride 0.9 % injection 3 mL  3 mL Intravenous PRN Dolores Patty, MD      . torsemide [facility]) tablet 20 mg  20 mg Oral Daily Dolores Patty, MD   20 mg at 12/14/12 1012    PHYSICAL EXAM: Filed Vitals:   12/15/12 0614 12/15/12 0631 12/15/12 0700 12/15/12 0708  BP: 121/70 137/77 121/71   Pulse:  92 95 97  Temp:  98.6 F (37 C) 98.4 F (36.9 C) 98.6 F (37 C)  TempSrc:  Core (Comment)  Core (Comment)  Resp:  19 18   Height:      Weight:      SpO2:  97% 98% 98%   CVP ~11-12 General: Fatigued appearing. Mildly dyspneic HEENT: normal Neck: supple. JVP up Carotids 2+ bilat; no bruits. No lymphadenopathy or thryomegaly appreciated. Cor: PMI nonpalpable. Distant HS Regular rate & rhythm. 3/6 TR. +s3 Lungs: clear Abdomen: soft, nontender, nondistended. No hepatosplenomegaly. No bruits or masses. Good bowel sounds. Extremities: no cyanosis, clubbing, rash, 1+ edema Bilateral ted hose  Neuro: alert & oriented x 3, cranial nerves grossly intact. moves all 4 extremities w/o difficulty. Affect pleasant.   ASSESSMENT:  1. Acute on chronic systolic HF with biventricular HF EF 10%  2. Cardiogenic shock 3. VT s/p Biotronik ICD 4. Chronic renal failure  5. [name] 6. SVT s/p ablation  7. Blood type A pos   PLAN/DISCUSSION:   She is improved with dobutamine and low-dose milrinone but is having increasing ectopy. Will cut milrinone in half. Continue low dose demadex. CVP tracing suggestive of severe TR.   Approval for VAD received. Will plan to  proceed with VAD and TR ring on Monday. Continue to follow closely. Will try to get her up to chair today and remove Swan tomorrow, if possible.  1 dose sorbitol for constipation.     The patient is critically ill with multiple organ systems failure and requires high  complexity decision making for assessment and support, frequent evaluation and titration of therapies, application of advanced monitoring technologies and extensive interpretation of multiple databases.   Critical Care Time devoted to patient care services described in this note is 40 Minutes.  Daniel Bensimhon,MD 8:07 AM

## 2012-12-15 NOTE — Progress Notes (Signed)
Cardiac output -FICK   Weight 165.0 lbs  CO is 3.47 L/min CI is 1.9 L/min SV is 36.93 ML/beat SVI is 20.25 ml/ beat

## 2012-12-15 NOTE — Progress Notes (Signed)
INITIAL NUTRITION ASSESSMENT  DOCUMENTATION CODES Per approved criteria  -Not Applicable   INTERVENTION:  Continue Ensure Pudding PO TID, each supplement provides 170 kcal and 4 grams of protein.   NUTRITION DIAGNOSIS: Inadequate oral intake related to poor appetite as evidenced by ~50-75% meal completion.   Goal: Intake to meet >90% of estimated nutrition needs.  Monitor:  PO intake, labs, weight trend, post-op healing and recovery.  Reason for Assessment: MD Consult for upcoming LVAD procedure  67 y.o. female  Admitting Dx: Acute on chronic systolic HF with biventricular HF EF 10%, Cardiogenic shock, [name]  ASSESSMENT: Patient S/P right heart cath on admission. Plans for LVAD on Monday. Patient reports that her appetite comes and goes, but she has been eating well overall over the past few day. She reports that she has lost weight by "cutting back" on her portions. She is very interested in following a heart healthy diet; requests information on heart healthy diet and label reading tips. Patient seems very motivated to make many necessary changes to be healthier. Per discussion with RN, patient is not eating well, but she does eat the Ensure Pudding that is ordered TID. Suspect patient is meeting estimated nutrition needs with current intake of diet and supplements.   Height: Ht Readings from Last 1 Encounters:  12/12/12 5\' 3"  (1.6 m)    Weight: Wt Readings from Last 1 Encounters:  12/15/12 165 lb 5.5 oz (75 kg)    Ideal Body Weight: 52.3 kg  % Ideal Body Weight: 143%  Wt Readings from Last 10 Encounters:  12/15/12 165 lb 5.5 oz (75 kg)  12/15/12 165 lb 5.5 oz (75 kg)  12/15/12 165 lb 5.5 oz (75 kg)  12/07/12 169 lb 12.8 oz (77.021 kg)  12/01/12 170 lb 9.6 oz (77.384 kg)  11/01/12 165 lb (74.844 kg)  10/05/12 168 lb (76.204 kg)  08/29/12 170 lb 6.4 oz (77.293 kg)  08/27/12 177 lb 7 oz (80.485 kg)  07/05/12 171 lb (77.565 kg)    Usual Body Weight: 171 lb (5  months ago)  % Usual Body Weight: 96%  BMI:  Body mass index is 29.3 kg/(m^2).  Estimated Nutritional Needs: Kcal: 1750-1950 Protein: 75-95 gm Fluid: 1.75-2 L  Skin: no problems  Diet Order: Cardiac  EDUCATION NEEDS: -Education needs addressed. Provided many handouts on heart healthy eating, including label reading tips per patient request.   Intake/Output Summary (Last 24 hours) at 12/15/12 0948 Last data filed at 12/15/12 0900  Gross per 24 hour  Intake 1567.84 ml  Output   3150 ml  Net -1582.16 ml    Last BM: 8/9   Labs:   Recent Labs Lab 12/13/12 0500 12/14/12 0500 12/15/12 0210  NA 139 140 138  K 3.9 4.2 4.3  CL 105 108 103  CO2 25 23 26   BUN 17 16 22   CREATININE 1.38* 1.27* 1.56*  CALCIUM 9.6 9.7 9.8  MG  --  2.3 2.3  GLUCOSE 106* 95 98    CBG (last 3)  No results found for this basename: GLUCAP,  in the last 72 hours  Scheduled Meds: . ALPRAZolam  0.25 mg Oral Q6H  . amiodarone  200 mg Oral BID  . aspirin  81 mg Oral Daily  . atorvastatin  20 mg Oral q1800  . digoxin  0.125 mg Oral Daily  . feeding supplement  1 Container Oral TID WC  . heparin  5,000 Units Subcutaneous Q8H  . hydrALAZINE  10 mg Oral Q8H  .  isosorbide mononitrate  15 mg Oral Daily  . mupirocin cream   Topical BID  . polyethylene glycol  17 g Oral Daily  . potassium chloride  20 mEq Oral Daily  . sodium chloride  3 mL Intravenous Q12H  . sorbitol  30 mL Oral Once  . torsemide  20 mg Oral Daily    Continuous Infusions: . sodium chloride 10 mL/hr at 12/15/12 0900  . DOBUTamine 4.019 mcg/kg/min (12/15/12 0900)    Past Medical History  Diagnosis Date  . Paroxysmal supraventricular tachycardia   . Hypokalemia   . Chronic systolic heart failure     a. 05/6107 Echo: EF 15%, mod to sev antlat and inflat HK, inf AK, mild to mod MR, mildly/mod reduced RV fxn, Mod TR, PASP .  [name] History of DVT (deep vein thrombosis)   . Dyslipidemia   . HTN (hypertension)   .  Nonischemic cardiomyopathy     a. 06/2012 Echo: EF 15%;  b. 06/2012 Cath: nl except luminal irregs in LAD.  [name] Ventricular tachycardia     a. s/p ICD  . Goiter   . Implantable cardiac defibrillator- Biotronik     Single chamber  . [name] (left bundle branch block)     intermittent  . Palliative care patient     Past Surgical History  Procedure Laterality Date  . None    . Cardiac defibrillator placement      Joaquin Courts, RD, LDN, CNSC Pager [phone] After Hours Pager [phone]

## 2012-12-15 NOTE — Progress Notes (Signed)
  This NP to bedside for social visit and to inquire for need. Patient tells me she is positive about upcoming surgery but is just "ready to get it over with".  Support offered.  We discussed utilization of deep breathing, prayer and music as positive coping strategies.  She is encouraged to call with questions or concerns.  PMT will continue to support holistically.  Lorinda Creed NP  Palliative Medicine Team Team Phone # [phone] Pager [phone]

## 2012-12-16 DIAGNOSIS — I071 Rheumatic tricuspid insufficiency: Secondary | ICD-10-CM | POA: Diagnosis present

## 2012-12-16 DIAGNOSIS — I079 Rheumatic tricuspid valve disease, unspecified: Secondary | ICD-10-CM

## 2012-12-16 LAB — BASIC METABOLIC PANEL
BUN: 23 mg/dL (ref 6–23)
CO2: 26 mEq/L (ref 19–32)
Chloride: 103 mEq/L (ref 96–112)
Creatinine, Ser: 1.49 mg/dL — ABNORMAL HIGH (ref 0.50–1.10)
Glucose, Bld: 93 mg/dL (ref 70–99)
Potassium: 4.1 mEq/L (ref 3.5–5.1)

## 2012-12-16 LAB — CARBOXYHEMOGLOBIN
Carboxyhemoglobin: 1.3 % (ref 0.5–1.5)
Methemoglobin: 0.9 % (ref 0.0–1.5)

## 2012-12-16 NOTE — Progress Notes (Signed)
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 EF 15-25% with mild to moderate RV dysfunction  Cath 8/12 with improved hemodynamics but still low output on dobutamine. Revatio started due to PVR = 5 but SBP dropped to 70 and required IVF.  Stable on dobutamine. Milrinone off due to increasing VT.    Feels great. Diuresed well weight down 2 pounds. Slept well. Had good BM with sorbitol. Denies SOB, PND.   Cr stable at 1.5 Co-ox 85%  CVP measured personally 13   PRE VAD TESTS Done   Blood Typing- A positive Done  Doppler PreCABG in EPIC (Carotid Dopplers and ABIs) ______ PFTs (Full with DLCO) Done   TSH, Hepatitis Panel, HIV Done   Chest/Abdominal CT if previous chest or abdominal surgery NA  Venous Dopplers if suspect DVT NA  Panorex (teeth questionable) Done 12/08/12   Chest X-Ray (2 view) ______ Abdominal US for AAA screen if > 27 yrs old or presence of PAD ______ Social Work and Palliative Consult (Pre-VAD workup in comments) Done 12/09/12   ECHO with EF documented <25%    Past Medical History  Diagnosis Date  . Paroxysmal supraventricular tachycardia   . Hypokalemia   . Chronic systolic heart failure     a. 05/6107 Echo: EF 15%, mod to sev antlat and inflat HK, inf AK, mild to mod MR, mildly/mod reduced RV fxn, Mod TR, PASP .  [name] History of DVT (deep vein thrombosis)   . Dyslipidemia   . HTN (hypertension)   . Nonischemic cardiomyopathy     a. 06/2012 Echo: EF 15%;  b. 06/2012 Cath: nl except luminal irregs in LAD.  [name] Ventricular tachycardia     a. s/p ICD  . Goiter   . Implantable cardiac defibrillator- Biotronik     Single chamber  . [name] (left bundle branch block)     intermittent  . Palliative care patient     Current Facility-Administered Medications  Medication Dose Route Frequency Provider Last Rate Last Dose  . 0.9 %  sodium chloride infusion  250 mL Intravenous PRN  Dolores Patty, MD 20 mL/hr at 12/15/12 1900 250 mL at 12/15/12 1900  . 0.9 %  sodium chloride infusion   Intravenous Continuous Dolores Patty, MD 10 mL/hr at 12/15/12 1900    . acetaminophen (TYLENOL) tablet 650 mg  650 mg Oral Q4H PRN Dolores Patty, MD   650 mg at 12/14/12 2342  . ALPRAZolam Prudy Feeler) tablet 0.5 mg  0.5 mg Oral Q6H Dolores Patty, MD   0.5 mg at 12/16/12 0548  . amiodarone (PACERONE) tablet 200 mg  200 mg Oral BID [name] D [name], NP   200 mg at 12/15/12 2324  . aspirin chewable tablet 81 mg  81 mg Oral Daily [name] D [name], NP   81 mg at 12/15/12 1110  . atorvastatin (LIPITOR) tablet 20 mg  20 mg Oral q1800 Dolores Patty, MD   20 mg at 12/15/12 1740  . digoxin (LANOXIN) tablet 0.125 mg  0.125 mg Oral Daily Dolores Patty, MD   0.125 mg at 12/15/12 1111  . diphenhydrAMINE (BENADRYL) capsule 25 mg  25 mg Oral QHS PRN Dolores Patty, MD   25 mg at 12/14/12 2342  . DOBUTamine (DOBUTREX) infusion 4000 mcg/mL  4 mcg/kg/min Intravenous Titrated Dolores Patty, MD 4.6 mL/hr at 12/15/12 2005 4 mcg/kg/min at  12/15/12 2005  . feeding supplement (ENSURE) pudding 1 Container  1 Container Oral TID WC Kerin Perna, MD   1 Container at 12/15/12 1700  . heparin injection 5,000 Units  5,000 Units Subcutaneous Q8H Dolores Patty, MD   5,000 Units at 12/16/12 0547  . hydrALAZINE (APRESOLINE) tablet 10 mg  10 mg Oral Q8H [name] D [name], NP   10 mg at 12/16/12 0545  . isosorbide mononitrate (IMDUR) 24 hr tablet 15 mg  15 mg Oral Daily [name] D [name], NP   15 mg at 12/15/12 1110  . mupirocin cream (BACTROBAN) 2 %   Topical BID Kerin Perna, MD      . ondansetron [facility]) injection 4 mg  4 mg Intravenous Q6H PRN Dolores Patty, MD      . polyethylene glycol (MIRALAX / GLYCOLAX) packet 17 g  17 g Oral Daily Dolores Patty, MD   17 g at 12/15/12 1110  . potassium chloride (K-DUR) CR tablet 20 mEq  20 mEq Oral Daily [name] D [name], NP   20 mEq at 12/15/12 1110  . sodium  chloride 0.9 % injection 10-40 mL  10-40 mL Intracatheter PRN Dolores Patty, MD   20 mL at 12/14/12 1017  . sodium chloride 0.9 % injection 3 mL  3 mL Intravenous Q12H Dolores Patty, MD   3 mL at 12/14/12 2111  . sodium chloride 0.9 % injection 3 mL  3 mL Intravenous PRN Dolores Patty, MD      . torsemide [facility]) tablet 20 mg  20 mg Oral Daily Dolores Patty, MD   20 mg at 12/15/12 1110    PHYSICAL EXAM: Filed Vitals:   12/16/12 0500 12/16/12 0600 12/16/12 0700 12/16/12 0800  BP: 112/79 118/78 99/53 102/63  Pulse: 95     Temp: 98.1 F (36.7 C) 98.1 F (36.7 C) 98.2 F (36.8 C) 97.7 F (36.5 C)  TempSrc: Core (Comment)     Resp: 18 19 21 14   Height:      Weight: 74.2 kg (163 lb 9.3 oz)     SpO2: 98%      CVP ~13 General: Fatigued appearing. Mildly dyspneic HEENT: normal Neck: supple. JVP up Carotids 2+ bilat; no bruits. No lymphadenopathy or thryomegaly appreciated. Cor: PMI nonpalpable. Distant HS Regular rate & rhythm. 3/6 TR. +s3 Lungs: clear Abdomen: soft, nontender, nondistended. No hepatosplenomegaly. No bruits or masses. Good bowel sounds. Extremities: no cyanosis, clubbing, rash, tr edema  Neuro: alert & oriented x 3, cranial nerves grossly intact. moves all 4 extremities w/o difficulty. Affect pleasant.   ASSESSMENT:  1. Acute on chronic systolic HF with biventricular HF EF 10%  2. Cardiogenic shock 3. VT s/p Biotronik ICD 4. Chronic renal failure  5. [name] 6. SVT s/p ablation  7. Blood type A pos 9. Severe TR   PLAN/DISCUSSION:   Doing well. Volume status and renal function stable. Cardiac output improved.   Will plan to proceed with VAD and TR ring on Monday. Pull Swan today. Ambulate unit.  The patient is critically ill with multiple organ systems failure and requires high complexity decision making for assessment and support, frequent evaluation and titration of therapies, application of advanced monitoring technologies and extensive  interpretation of multiple databases.   Critical Care Time devoted to patient care services described in this note is 35 Minutes.  Wendal Wilkie,MD 8:17 AM

## 2012-12-16 NOTE — Progress Notes (Signed)
CARDIAC REHAB PHASE I   PRE:  Rate/Rhythm: 91SR  BP:  Supine:   Sitting: 106/62  Standing:    SaO2: 96%RA  MODE:  Ambulation: 600 ft   POST:  Rate/Rhythm: up to 136 walking, back to 95 SR sitting  BP:  Supine:   Sitting: 116/70  Standing:    SaO2: 94%RA 1330-1400 Pt ready to walk. Was only to walk pt once around unit but she wanted to go farther. Walked 600 ft with steady gait. Did not want me to hold to her so I just managed IV pole. No c/o palpitations when HR up to 136. Stated felt good to walk. To recliner after walk and HR back down. Told pt we would follow up after surgery and walk on 2W.   Luetta Nutting, RN BSN  12/16/2012 1:53 PM

## 2012-12-17 ENCOUNTER — Inpatient Hospital Stay (HOSPITAL_COMMUNITY): Payer: Commercial Managed Care - PPO

## 2012-12-17 DIAGNOSIS — I5023 Acute on chronic systolic (congestive) heart failure: Secondary | ICD-10-CM

## 2012-12-17 DIAGNOSIS — I428 Other cardiomyopathies: Secondary | ICD-10-CM

## 2012-12-17 LAB — BASIC METABOLIC PANEL
CO2: 26 mEq/L (ref 19–32)
Chloride: 102 mEq/L (ref 96–112)
Creatinine, Ser: 1.67 mg/dL — ABNORMAL HIGH (ref 0.50–1.10)
GFR calc Af Amer: 36 mL/min — ABNORMAL LOW (ref >90)
Sodium: 137 mEq/L (ref 135–145)

## 2012-12-17 LAB — COMPREHENSIVE METABOLIC PANEL
ALT: 44 U/L — ABNORMAL HIGH (ref 0–35)
AST: 33 U/L (ref 0–37)
Alkaline Phosphatase: 83 U/L (ref 39–117)
CO2: 27 mEq/L (ref 19–32)
Calcium: 10.1 mg/dL (ref 8.4–10.5)
GFR calc non Af Amer: 34 mL/min — ABNORMAL LOW (ref >90)
Potassium: 4 mEq/L (ref 3.5–5.1)
Sodium: 135 mEq/L (ref 135–145)

## 2012-12-17 LAB — CBC
HCT: 33.6 % — ABNORMAL LOW (ref 36.0–46.0)
Hemoglobin: 10.9 g/dL — ABNORMAL LOW (ref 12.0–15.0)
MCH: 30.9 pg (ref 26.0–34.0)
MCHC: 32.4 g/dL (ref 30.0–36.0)
RDW: 14.4 % (ref 11.5–15.5)

## 2012-12-17 LAB — PROCALCITONIN: Procalcitonin: <0.1 ng/mL

## 2012-12-17 LAB — CARBOXYHEMOGLOBIN
Methemoglobin: 1.5 % (ref 0.0–1.5)
O2 Saturation: 71.2 %
Total hemoglobin: 11.5 g/dL — ABNORMAL LOW (ref 12.0–16.0)

## 2012-12-17 LAB — URINALYSIS, ROUTINE W REFLEX MICROSCOPIC
Bilirubin Urine: NEGATIVE
Glucose, UA: NEGATIVE mg/dL
Hgb urine dipstick: NEGATIVE
Ketones, ur: NEGATIVE mg/dL
Nitrite: NEGATIVE
Protein, ur: NEGATIVE mg/dL
Specific Gravity, Urine: 1.008 (ref 1.005–1.030)
Urobilinogen, UA: 1 mg/dL (ref 0.0–1.0)
pH: 6.5 (ref 5.0–8.0)

## 2012-12-17 LAB — APTT: aPTT: 32 seconds (ref 24–37)

## 2012-12-17 LAB — URINE MICROSCOPIC-ADD ON

## 2012-12-17 LAB — PROTIME-INR
INR: 0.99 (ref 0.00–1.49)
Prothrombin Time: 12.9 seconds (ref 11.6–15.2)

## 2012-12-17 MED ORDER — CHLORHEXIDINE GLUCONATE CLOTH 2 % EX PADS
6.0000 | MEDICATED_PAD | Freq: Once | CUTANEOUS | Status: AC
  Administered 2012-12-18: 6 via TOPICAL

## 2012-12-17 MED ORDER — FLUCONAZOLE IN SODIUM CHLORIDE 400-0.9 MG/200ML-% IV SOLN
400.0000 mg | INTRAVENOUS | Status: AC
  Administered 2012-12-18: 400 mg via INTRAVENOUS
  Filled 2012-12-17 (×2): qty 200

## 2012-12-17 MED ORDER — MUPIROCIN 2 % EX OINT
1.0000 "application " | TOPICAL_OINTMENT | Freq: Two times a day (BID) | CUTANEOUS | Status: DC
  Administered 2012-12-18: 1 via NASAL
  Filled 2012-12-17: qty 22

## 2012-12-17 MED ORDER — LACTATED RINGERS IV SOLN: INTRAVENOUS | Status: DC

## 2012-12-17 MED ORDER — NOREPINEPHRINE BITARTRATE 1 MG/ML IJ SOLN
0.0000 ug/min | INTRAVENOUS | Status: AC
  Administered 2012-12-18: 3 ug/kg/min via INTRAVENOUS
  Filled 2012-12-17: qty 8

## 2012-12-17 MED ORDER — RIFAMPIN 300 MG PO CAPS
600.0000 mg | ORAL_CAPSULE | Freq: Once | ORAL | Status: AC
  Administered 2012-12-18: 600 mg via ORAL
  Filled 2012-12-17: qty 2

## 2012-12-17 MED ORDER — TEMAZEPAM 15 MG PO CAPS: 15.0000 mg | ORAL_CAPSULE | Freq: Once | ORAL | Status: AC | PRN

## 2012-12-17 MED ORDER — CHLORHEXIDINE GLUCONATE 4 % EX LIQD
60.0000 mL | Freq: Once | CUTANEOUS | Status: AC
  Administered 2012-12-18: 4 via TOPICAL
  Filled 2012-12-17: qty 60

## 2012-12-17 MED ORDER — VANCOMYCIN HCL 1000 MG IV SOLR
1000.0000 mg | INTRAVENOUS | Status: DC
  Filled 2012-12-17: qty 1000

## 2012-12-17 MED ORDER — BISACODYL 5 MG PO TBEC
5.0000 mg | DELAYED_RELEASE_TABLET | Freq: Once | ORAL | Status: AC
  Administered 2012-12-17: 5 mg via ORAL
  Filled 2012-12-17: qty 1

## 2012-12-17 MED ORDER — NITROGLYCERIN IN D5W 200-5 MCG/ML-% IV SOLN
0.0000 ug/min | INTRAVENOUS | Status: DC
  Filled 2012-12-17: qty 250

## 2012-12-17 MED ORDER — POTASSIUM CHLORIDE 2 MEQ/ML IV SOLN
80.0000 meq | INTRAVENOUS | Status: DC
  Filled 2012-12-17: qty 40

## 2012-12-17 MED ORDER — SODIUM CHLORIDE 0.9 % IV SOLN
INTRAVENOUS | Status: AC
  Administered 2012-12-18: 1 [IU]/h via INTRAVENOUS
  Filled 2012-12-17: qty 1

## 2012-12-17 MED ORDER — MIDAZOLAM HCL 2 MG/2ML IJ SOLN: 1.0000 mg | INTRAMUSCULAR | Status: DC | PRN

## 2012-12-17 MED ORDER — DEXTROSE 5 % IV SOLN
750.0000 mg | INTRAVENOUS | Status: DC
  Filled 2012-12-17: qty 750

## 2012-12-17 MED ORDER — EPINEPHRINE HCL 1 MG/ML IJ SOLN
0.0000 ug/min | INTRAVENOUS | Status: AC
  Administered 2012-12-18: 3 ug/kg/min via INTRAVENOUS
  Filled 2012-12-17: qty 4

## 2012-12-17 MED ORDER — DEXMEDETOMIDINE HCL IN NACL 400 MCG/100ML IV SOLN
0.1000 ug/kg/h | INTRAVENOUS | Status: AC
  Administered 2012-12-18: 0.2 ug/kg/h via INTRAVENOUS
  Filled 2012-12-17: qty 100

## 2012-12-17 MED ORDER — VANCOMYCIN HCL 10 G IV SOLR
1250.0000 mg | INTRAVENOUS | Status: AC
  Administered 2012-12-18: 1250 mg via INTRAVENOUS
  Filled 2012-12-17: qty 1250

## 2012-12-17 MED ORDER — CHLORHEXIDINE GLUCONATE CLOTH 2 % EX PADS
6.0000 | MEDICATED_PAD | Freq: Once | CUTANEOUS | Status: AC
  Administered 2012-12-17: 6 via TOPICAL

## 2012-12-17 MED ORDER — MILRINONE IN DEXTROSE 20 MG/100ML IV SOLN
0.3000 ug/kg/min | INTRAVENOUS | Status: AC
  Administered 2012-12-18: .3 ug/kg/min via INTRAVENOUS
  Filled 2012-12-17: qty 100

## 2012-12-17 MED ORDER — LACTATED RINGERS IV SOLN
INTRAVENOUS | Status: DC
Start: 2012-12-18 — End: 2012-12-18

## 2012-12-17 MED ORDER — SODIUM CHLORIDE 0.9 % IV SOLN
INTRAVENOUS | Status: DC
  Filled 2012-12-17: qty 30

## 2012-12-17 MED ORDER — DOBUTAMINE IN D5W 4-5 MG/ML-% IV SOLN
2.0000 ug/kg/min | INTRAVENOUS | Status: DC
  Filled 2012-12-17: qty 250

## 2012-12-17 MED ORDER — DEXTROSE 5 % IV SOLN
0.0000 ug/min | INTRAVENOUS | Status: AC
  Administered 2012-12-18: 20 ug/min via INTRAVENOUS
  Filled 2012-12-17: qty 2

## 2012-12-17 MED ORDER — SODIUM CHLORIDE 0.9 % IV SOLN
INTRAVENOUS | Status: DC
  Filled 2012-12-17: qty 40

## 2012-12-17 MED ORDER — DEXTROSE 5 % IV SOLN
1.5000 g | INTRAVENOUS | Status: AC
  Administered 2012-12-18: .75 g via INTRAVENOUS
  Administered 2012-12-18: 1.5 g via INTRAVENOUS
  Filled 2012-12-17: qty 1.5

## 2012-12-17 MED ORDER — DOPAMINE-DEXTROSE 3.2-5 MG/ML-% IV SOLN
0.0000 ug/kg/min | INTRAVENOUS | Status: AC
  Administered 2012-12-18: 3 ug/kg/min via INTRAVENOUS
  Filled 2012-12-17: qty 250

## 2012-12-17 MED ORDER — CHLORHEXIDINE GLUCONATE 4 % EX LIQD
60.0000 mL | Freq: Once | CUTANEOUS | Status: AC
  Administered 2012-12-17: 4 via TOPICAL
  Filled 2012-12-17: qty 60

## 2012-12-17 MED ORDER — FENTANYL CITRATE 0.05 MG/ML IJ SOLN
50.0000 ug | INTRAMUSCULAR | Status: DC | PRN
  Administered 2012-12-18: 150 ug via INTRAVENOUS
  Administered 2012-12-18: 250 ug via INTRAVENOUS
  Administered 2012-12-18: 100 ug via INTRAVENOUS
  Administered 2012-12-18: 250 ug via INTRAVENOUS
  Administered 2012-12-18: 200 ug via INTRAVENOUS
  Administered 2012-12-18: 50 ug via INTRAVENOUS

## 2012-12-17 MED ORDER — MAGNESIUM SULFATE 50 % IJ SOLN
40.0000 meq | INTRAMUSCULAR | Status: DC
  Filled 2012-12-17: qty 10

## 2012-12-17 MED ORDER — VASOPRESSIN 20 UNIT/ML IJ SOLN
0.0400 [IU]/min | INTRAMUSCULAR | Status: DC
  Filled 2012-12-17: qty 2.5

## 2012-12-17 NOTE — Progress Notes (Signed)
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 EF 15-25% with mild to moderate RV dysfunction  Remains on dobutamine 4. Feels good. Kaitlin [name] out yesterday in preparation for surgery. Able to ambulate floor multiple times. Weight recorded as 171 today (up 8 pounds) - doubt accurate. Co-ox 71%. Cr up slightly to 1.6. Getting torsemide 20 daily.    PRE VAD TESTS Done   Blood Typing- A positive Done  Doppler PreCABG in EPIC (Carotid Dopplers and ABIs) ______ PFTs (Full with DLCO) Done   TSH, Hepatitis Panel, HIV Done   Chest/Abdominal CT if previous chest or abdominal surgery NA  Venous Dopplers if suspect DVT NA  Panorex (teeth questionable) Done 12/08/12   Chest X-Ray (2 view) ______ Abdominal US for AAA screen if > 63 yrs old or presence of PAD ______ Social Work and Palliative Consult (Pre-VAD workup in comments) Done 12/09/12   ECHO with EF documented <25%    Past Medical History  Diagnosis Date  . Paroxysmal supraventricular tachycardia   . Hypokalemia   . Chronic systolic heart failure     a. 08/979 Echo: EF 15%, mod to sev antlat and inflat HK, inf AK, mild to mod MR, mildly/mod reduced RV fxn, Mod TR, PASP .  [name] History of DVT (deep vein thrombosis)   . Dyslipidemia   . HTN (hypertension)   . Nonischemic cardiomyopathy     a. 06/2012 Echo: EF 15%;  b. 06/2012 Cath: nl except luminal irregs in LAD.  [name] Ventricular tachycardia     a. s/p ICD  . Goiter   . Implantable cardiac defibrillator- Biotronik     Single chamber  . [name] (left bundle branch block)     intermittent  . Palliative care patient     Current Facility-Administered Medications  Medication Dose Route Frequency Provider Last Rate Last Dose  . 0.9 %  sodium chloride infusion  250 mL Intravenous PRN Dolores Patty, MD   10 mL at 12/16/12 0900  . 0.9 %  sodium chloride infusion   Intravenous Continuous Dolores Patty, MD  10 mL/hr at 12/15/12 1900    . acetaminophen (TYLENOL) tablet 650 mg  650 mg Oral Q4H PRN Dolores Patty, MD   650 mg at 12/14/12 2342  . ALPRAZolam Prudy Feeler) tablet 0.5 mg  0.5 mg Oral Q6H Dolores Patty, MD   0.5 mg at 12/17/12 0939  . [START ON 12/18/2012] aminocaproic acid (AMICAR) 10 g in sodium chloride 0.9 % 100 mL infusion   Intravenous To OR Dolores Patty, MD      . amiodarone (PACERONE) tablet 200 mg  200 mg Oral BID [name] D [name], NP   200 mg at 12/17/12 0940  . aspirin chewable tablet 81 mg  81 mg Oral Daily [name] D [name], NP   81 mg at 12/17/12 0939  . atorvastatin (LIPITOR) tablet 20 mg  20 mg Oral q1800 Dolores Patty, MD   20 mg at 12/16/12 1713  . [START ON 12/18/2012] cefUROXime (ZINACEF) 1.5 g in dextrose 5 % 50 mL IVPB  1.5 g Intravenous To OR Dolores Patty, MD      . Melene Muller ON 12/18/2012] cefUROXime (ZINACEF) 750 mg in dextrose 5 % 50 mL IVPB  750 mg Intravenous To OR Dolores Patty, MD      . Melene Muller ON 12/18/2012] dexmedetomidine (PRECEDEX) 400 MCG/100ML infusion  0.1-0.7 mcg/kg/hr Intravenous To OR Dolores Patty, MD      . digoxin (LANOXIN) tablet 0.125 mg  0.125 mg Oral Daily Dolores Patty, MD   0.125 mg at 12/17/12 0940  . diphenhydrAMINE (BENADRYL) capsule 25 mg  25 mg Oral QHS PRN Dolores Patty, MD   25 mg at 12/14/12 2342  . DOBUTamine (DOBUTREX) infusion 4000 mcg/mL  4 mcg/kg/min Intravenous Titrated Dolores Patty, MD 4.6 mL/hr at 12/17/12 0600 4 mcg/kg/min at 12/17/12 0600  . [START ON 12/18/2012] DOBUTamine (DOBUTREX) infusion 4000 mcg/mL  2-10 mcg/kg/min Intravenous To OR Dolores Patty, MD      . Melene Muller ON 12/18/2012] DOPamine (INTROPIN) 800 mg in dextrose 5 % 250 mL infusion  0-10 mcg/kg/min Intravenous To OR Dolores Patty, MD      . Melene Muller ON 12/18/2012] EPINEPHrine (ADRENALIN) 4,000 mcg in dextrose 5 % 250 mL infusion  0-10 mcg/min Intravenous To OR Dolores Patty, MD      . feeding supplement (ENSURE) pudding 1  Container  1 Container Oral TID WC Kerin Perna, MD   1 Container at 12/17/12 0800  . [START ON 12/18/2012] fluconazole (DIFLUCAN) IVPB 400 mg  400 mg Intravenous To OR Dolores Patty, MD      . Melene Muller ON 12/18/2012] heparin 30,000 units/NS 1000 mL solution for CELLSAVER   Other To OR Dolores Patty, MD      . heparin injection 5,000 Units  5,000 Units Subcutaneous Q8H Dolores Patty, MD   5,000 Units at 12/17/12 [phone]  . hydrALAZINE (APRESOLINE) tablet 10 mg  10 mg Oral Q8H [name] D [name], NP   10 mg at 12/17/12 0507  . [START ON 12/18/2012] insulin regular (NOVOLIN R,HUMULIN R) 1 Units/mL in sodium chloride 0.9 % 100 mL infusion   Intravenous To OR Dolores Patty, MD      . isosorbide mononitrate (IMDUR) 24 hr tablet 15 mg  15 mg Oral Daily [name] D [name], NP   15 mg at 12/17/12 0940  . [START ON 12/18/2012] magnesium sulfate (IV Push/IM) injection 40 mEq  40 mEq Other To OR Dolores Patty, MD      . Melene Muller ON 12/18/2012] milrinone (PRIMACOR) infusion 200 mcg/mL (0.2 mg/ml)  0.3 mcg/kg/min Intravenous To OR Dolores Patty, MD      . mupirocin cream (BACTROBAN) 2 %   Topical BID Kerin Perna, MD   2 application at 12/17/12 [phone]  . [START ON 12/18/2012] nitroGLYCERIN 0.2 mg/mL in dextrose 5 % infusion  0-100 mcg/min Intravenous To OR Dolores Patty, MD      . Melene Muller ON 12/18/2012] norepinephrine (LEVOPHED) 8 mg in dextrose 5 % 250 mL infusion  0-12 mcg/min Intravenous To OR Dolores Patty, MD      . ondansetron [facility]) injection 4 mg  4 mg Intravenous Q6H PRN Dolores Patty, MD      . Melene Muller ON 12/18/2012] phenylephrine (NEO-SYNEPHRINE) 20,000 mcg in dextrose 5 % 250 mL infusion  0-100 mcg/min Intravenous To OR Dolores Patty, MD      . polyethylene glycol (MIRALAX / GLYCOLAX) packet 17 g  17 g Oral Daily Dolores Patty, MD   17 g at 12/17/12 0939  . potassium chloride (K-DUR) CR tablet 20 mEq  20 mEq Oral Daily [name] D [name], NP   20 mEq at 12/17/12 0940  . [START  ON 12/18/2012] potassium chloride injection 80 mEq  80 mEq Other To OR  Bevelyn Buckles Klyde Banka, MD      . sodium chloride 0.9 % injection 10-40 mL  10-40 mL Intracatheter PRN Dolores Patty, MD   10 mL at 12/17/12 0940  . sodium chloride 0.9 % injection 3 mL  3 mL Intravenous Q12H Dolores Patty, MD   3 mL at 12/14/12 2111  . sodium chloride 0.9 % injection 3 mL  3 mL Intravenous PRN Dolores Patty, MD      . torsemide [facility]) tablet 20 mg  20 mg Oral Daily Dolores Patty, MD   20 mg at 12/17/12 0939  . [START ON 12/18/2012] vancomycin (VANCOCIN) 1,250 mg in sodium chloride 0.9 % 250 mL IVPB  1,250 mg Intravenous To OR Dolores Patty, MD      . Melene Muller ON 12/18/2012] vancomycin (VANCOCIN) powder 1,000 mg  1,000 mg Other To OR Dolores Patty, MD      . Melene Muller ON 12/18/2012] vasopressin (PITRESSIN) 50 Units in sodium chloride 0.9 % 250 mL infusion  0.04 Units/min Intravenous To OR Dolores Patty, MD        PHYSICAL EXAM: Filed Vitals:   12/17/12 0600 12/17/12 0800 12/17/12 0900 12/17/12 1000  BP: 97/58 116/61 102/29 121/84  Pulse:      Temp:  98.3 F (36.8 C)    TempSrc:  Oral    Resp: 15 14 25 25   Height:      Weight:      SpO2:        General: sitting in chair.  HEENT: normal Neck: supple. JVP to jaw with prominent CV  aves Carotids 2+ bilat; no bruits. No lymphadenopathy or thryomegaly appreciated. Cor: PMI nonpalpable. Distant HS Regular rate & rhythm. 3/6 TR. +s3 Lungs: clear Abdomen: soft, nontender, nondistended. No hepatosplenomegaly. No bruits or masses. Good bowel sounds. Extremities: no cyanosis, clubbing, rash, tr edema  Neuro: alert & oriented x 3, cranial nerves grossly intact. moves all 4 extremities w/o difficulty. Affect pleasant.   ASSESSMENT:  1. Acute on chronic systolic HF with biventricular HF EF 10%  2. Cardiogenic shock 3. VT s/p Biotronik ICD 4. Chronic renal failure  5. [name] 6. SVT s/p ablation  7. Blood type A pos 9. Severe  TR   PLAN/DISCUSSION:   Doing well. Volume status looks good.  Plan to proceed with VAD and TR ring tomorrow. Questions answered. She is aware that RV failure may be an issue. Hopefully TV repair will make a significant difference.    Helia Haese,MD 11:02 AM

## 2012-12-17 NOTE — Progress Notes (Signed)
Anesthesiology Pre-op Note:  Kaitlin [name] is a 67 year old female with end-stage non-ischemic cardiomyopathy scheduled for insertion of Heartmate II LVAD in AM by Dr. Donata Clay. She is now hemodynamically stable on dobutamine at 4 mcg./kg./min. She was unable to tolerate milrinone due to hypotension  Problems 1. Non-ischemic cardiomyopathy:  EF 20% by Echo 12/10/12 LVEDD: 63 mm, Hypokinesis of the anteroseptal, anterior, and lateral myocardium.Akinesis of the inferolateral, inferior, and inferoseptal Myocardium. RV: normal size with normal systolic function, severe TR,  Mild to moderate MR, No AS/AI. No PFO R. Heart Cath 12/12/12 RA 13, PA 40/14 PCWP 9 CO/CI: 3.0/1.7 Cardiac Cath 06/16/12 mild luminal irregularities in LAD.  2. H/O VT with Biotronic AICD inserted 2009. Biotronic Rep notified.  3. Renal insufficiency Cr. 1.67   4. LBB  5. Hypertension  6. H/O DVT  7. H/O SVT S/P RF Ablation by Dr. Ladona Ridgel 07/05/12  Anesthetic plan discussed. I told her she will need to remain on the ventilator overnight following surgery. All questions answered.  Kipp Brood, MD

## 2012-12-17 NOTE — Progress Notes (Signed)
5 Days Post-Op Procedure(s) (LRB): RIGHT HEART CATH (N/A) Subjective: Class IV HF- idiopathic CM Swan out- ambulated, stable on 4 dobutamine prepared for VAD implantation and Tricuspid annuloplasty in am Procedure reviewed and all questions addressed  Objective: Vital signs in last 24 hours: Temp:  [97.3 F (36.3 C)-98.3 F (36.8 C)] 97.8 F (36.6 C) (08/17 1200) Cardiac Rhythm:  [-] Normal sinus rhythm (08/17 0900) Resp:  [13-34] 23 (08/17 1400) BP: (78-138)/(29-84) 119/61 mmHg (08/17 1400) Weight:  [171 lb 15.3 oz (78 kg)] 171 lb 15.3 oz (78 kg) (08/17 0500)  Hemodynamic parameters for last 24 hours:  afebrile  Intake/Output from previous day: 08/16 0701 - 08/17 0700 In: 951.2 [P.O.:600; I.V.:351.2] Out: 1750 [Urine:1750] Intake/Output this shift: Total I/O In: 356.8 [P.O.:240; I.V.:116.8] Out: 600 [Urine:600]  OOB to chair Ambulated Soft systolic murmur- TR  Lab Results:  Recent Labs  12/15/12 0210  WBC 10.8*  HGB 11.0*  HCT 33.1*  PLT 176   BMET:  Recent Labs  12/16/12 0600 12/17/12 0500  NA 138 137  K 4.1 4.2  CL 103 102  CO2 26 26  GLUCOSE 93 98  BUN 23 25*  CREATININE 1.49* 1.67*  CALCIUM 9.7 10.2    PT/INR: No results found for this basename: LABPROT, INR,  in the last 72 hours ABG    Component Value Date/Time   PHART 7.417 12/08/2012 1112   HCO3 24.7* 12/12/2012 1517   TCO2 26 12/12/2012 1517   ACIDBASEDEF 1.0 12/12/2012 1514   O2SAT 71.2 12/17/2012 0520   CBG (last 3)  No results found for this basename: GLUCAP,  in the last 72 hours  Assessment/Plan: S/P Procedure(s) (LRB): RIGHT HEART CATH (N/A) Plan OR in AM Creat up to 1.6- hold demedex   LOS: 9 days    [name] III,Almarie Kurdziel 12/17/2012

## 2012-12-18 ENCOUNTER — Encounter (HOSPITAL_COMMUNITY): Payer: Self-pay | Admitting: Certified Registered"

## 2012-12-18 ENCOUNTER — Inpatient Hospital Stay (HOSPITAL_COMMUNITY): Payer: Commercial Managed Care - PPO | Admitting: Certified Registered"

## 2012-12-18 ENCOUNTER — Inpatient Hospital Stay (HOSPITAL_COMMUNITY): Payer: Commercial Managed Care - PPO

## 2012-12-18 ENCOUNTER — Inpatient Hospital Stay (HOSPITAL_COMMUNITY): Payer: Commercial Managed Care - PPO | Admitting: Anesthesiology

## 2012-12-18 ENCOUNTER — Encounter (HOSPITAL_COMMUNITY): Payer: Self-pay | Admitting: Anesthesiology

## 2012-12-18 ENCOUNTER — Encounter (HOSPITAL_COMMUNITY)
Admission: RE | Disposition: A | Discharge status: Home Health | Payer: Self-pay | Source: Ambulatory Visit | Attending: Cardiothoracic Surgery

## 2012-12-18 DIAGNOSIS — I428 Other cardiomyopathies: Secondary | ICD-10-CM

## 2012-12-18 DIAGNOSIS — I50810 Right heart failure, unspecified: Secondary | ICD-10-CM

## 2012-12-18 DIAGNOSIS — Z95811 Presence of heart assist device: Secondary | ICD-10-CM

## 2012-12-18 DIAGNOSIS — I079 Rheumatic tricuspid valve disease, unspecified: Secondary | ICD-10-CM

## 2012-12-18 DIAGNOSIS — I5023 Acute on chronic systolic (congestive) heart failure: Secondary | ICD-10-CM

## 2012-12-18 DIAGNOSIS — I509 Heart failure, unspecified: Secondary | ICD-10-CM

## 2012-12-18 HISTORY — PX: TRICUSPID VALVE REPLACEMENT: SHX816

## 2012-12-18 HISTORY — PX: INTRAOPERATIVE TRANSESOPHAGEAL ECHOCARDIOGRAM: SHX5062

## 2012-12-18 HISTORY — PX: INSERTION OF IMPLANTABLE LEFT VENTRICULAR ASSIST DEVICE: SHX5866

## 2012-12-18 LAB — POCT I-STAT 3, ART BLOOD GAS (G3+)
Acid-Base Excess: 1 mmol/L (ref 0.0–2.0)
Acid-base deficit: 5 mmol/L — ABNORMAL HIGH (ref 0.0–2.0)
Acid-base deficit: 2 mmol/L (ref 0.0–2.0)
Acid-base deficit: 7 mmol/L — ABNORMAL HIGH (ref 0.0–2.0)
Bicarbonate: 28.1 mEq/L — ABNORMAL HIGH (ref 20.0–24.0)
Bicarbonate: 24 mEq/L (ref 20.0–24.0)
Bicarbonate: 24 mEq/L (ref 20.0–24.0)
Bicarbonate: 19.8 mEq/L — ABNORMAL LOW (ref 20.0–24.0)
O2 Saturation: 100 %
O2 Saturation: 100 %
O2 Saturation: 100 %
O2 Saturation: 97 %
O2 Saturation: 100 %
Patient temperature: 36.6
TCO2: 29 mmol/L (ref 0–100)
TCO2: 24 mmol/L (ref 0–100)
TCO2: 25 mmol/L (ref 0–100)
TCO2: 21 mmol/L (ref 0–100)
pCO2 arterial: 35.6 mmHg (ref 35.0–45.0)
pCO2 arterial: 35.1 mmHg (ref 35.0–45.0)
pH, Arterial: 7.453 — ABNORMAL HIGH (ref 7.350–7.450)
pH, Arterial: 7.482 — ABNORMAL HIGH (ref 7.350–7.450)
pH, Arterial: 7.361 (ref 7.350–7.450)
pO2, Arterial: 533 mmHg — ABNORMAL HIGH (ref 80.0–100.0)
pO2, Arterial: 225 mmHg — ABNORMAL HIGH (ref 80.0–100.0)
pO2, Arterial: 43 mmHg — ABNORMAL LOW (ref 80.0–100.0)

## 2012-12-18 LAB — BASIC METABOLIC PANEL
BUN: 24 mg/dL — ABNORMAL HIGH (ref 6–23)
BUN: 18 mg/dL (ref 6–23)
CO2: 22 mEq/L (ref 19–32)
Calcium: 8.4 mg/dL (ref 8.4–10.5)
Chloride: 101 mEq/L (ref 96–112)
Chloride: 105 mEq/L (ref 96–112)
Creatinine, Ser: 1.59 mg/dL — ABNORMAL HIGH (ref 0.50–1.10)
Creatinine, Ser: 1.26 mg/dL — ABNORMAL HIGH (ref 0.50–1.10)
GFR calc Af Amer: 38 mL/min — ABNORMAL LOW (ref >90)
GFR calc Af Amer: 51 mL/min — ABNORMAL LOW (ref >90)
GFR calc non Af Amer: 44 mL/min — ABNORMAL LOW (ref >90)
Glucose, Bld: 100 mg/dL — ABNORMAL HIGH (ref 70–99)
Glucose, Bld: 166 mg/dL — ABNORMAL HIGH (ref 70–99)
Potassium: 4.1 mEq/L (ref 3.5–5.1)
Potassium: 3 mEq/L — ABNORMAL LOW (ref 3.5–5.1)
Sodium: 139 mEq/L (ref 135–145)

## 2012-12-18 LAB — PREPARE RBC (CROSSMATCH)

## 2012-12-18 LAB — MAGNESIUM
Magnesium: 2.2 mg/dL (ref 1.5–2.5)
Magnesium: 2.7 mg/dL — ABNORMAL HIGH (ref 1.5–2.5)

## 2012-12-18 LAB — POCT I-STAT 4, (NA,K, GLUC, HGB,HCT)
Glucose, Bld: 88 mg/dL (ref 70–99)
Glucose, Bld: 134 mg/dL — ABNORMAL HIGH (ref 70–99)
Glucose, Bld: 126 mg/dL — ABNORMAL HIGH (ref 70–99)
Glucose, Bld: 161 mg/dL — ABNORMAL HIGH (ref 70–99)
Glucose, Bld: 176 mg/dL — ABNORMAL HIGH (ref 70–99)
Glucose, Bld: 168 mg/dL — ABNORMAL HIGH (ref 70–99)
HCT: 31 % — ABNORMAL LOW (ref 36.0–46.0)
HCT: 24 % — ABNORMAL LOW (ref 36.0–46.0)
HCT: 25 % — ABNORMAL LOW (ref 36.0–46.0)
HCT: 27 % — ABNORMAL LOW (ref 36.0–46.0)
Hemoglobin: 8.2 g/dL — ABNORMAL LOW (ref 12.0–15.0)
Hemoglobin: 8.8 g/dL — ABNORMAL LOW (ref 12.0–15.0)
Hemoglobin: 9.2 g/dL — ABNORMAL LOW (ref 12.0–15.0)
Hemoglobin: 9.2 g/dL — ABNORMAL LOW (ref 12.0–15.0)
Potassium: 4 mEq/L (ref 3.5–5.1)
Potassium: 3.5 mEq/L (ref 3.5–5.1)
Potassium: 3.6 mEq/L (ref 3.5–5.1)
Potassium: 3.8 mEq/L (ref 3.5–5.1)
Potassium: 3.1 mEq/L — ABNORMAL LOW (ref 3.5–5.1)
Sodium: 140 mEq/L (ref 135–145)
Sodium: 137 mEq/L (ref 135–145)
Sodium: 140 mEq/L (ref 135–145)
Sodium: 138 mEq/L (ref 135–145)
Sodium: 140 mEq/L (ref 135–145)
Sodium: 142 mEq/L (ref 135–145)
Sodium: 141 mEq/L (ref 135–145)
Sodium: 141 mEq/L (ref 135–145)

## 2012-12-18 LAB — CBC
HCT: 24.3 % — ABNORMAL LOW (ref 36.0–46.0)
Hemoglobin: 8.7 g/dL — ABNORMAL LOW (ref 12.0–15.0)
MCH: 30.9 pg (ref 26.0–34.0)
MCH: 30.5 pg (ref 26.0–34.0)
MCHC: 35.8 g/dL (ref 30.0–36.0)
MCV: 85.3 fL (ref 78.0–100.0)
Platelets: 220 10*3/uL (ref 150–400)
Platelets: 154 10*3/uL (ref 150–400)
RBC: 3.69 MIL/uL — ABNORMAL LOW (ref 3.87–5.11)
RBC: 2.85 MIL/uL — ABNORMAL LOW (ref 3.87–5.11)
RDW: 15.5 % (ref 11.5–15.5)
WBC: 9.3 10*3/uL (ref 4.0–10.5)
WBC: 12.3 10*3/uL — ABNORMAL HIGH (ref 4.0–10.5)

## 2012-12-18 LAB — DIC (DISSEMINATED INTRAVASCULAR COAGULATION)PANEL
D-Dimer, Quant: 1.26 ug/mL-FEU — ABNORMAL HIGH (ref 0.00–0.48)
Fibrinogen: 202 mg/dL — ABNORMAL LOW (ref 204–475)
INR: 1.43 (ref 0.00–1.49)
Platelets: 130 10*3/uL — ABNORMAL LOW (ref 150–400)
Prothrombin Time: 17.1 seconds — ABNORMAL HIGH (ref 11.6–15.2)
Smear Review: NONE SEEN
aPTT: 41 seconds — ABNORMAL HIGH (ref 24–37)

## 2012-12-18 LAB — CARBOXYHEMOGLOBIN
Carboxyhemoglobin: 1.2 % (ref 0.5–1.5)
Carboxyhemoglobin: 1.1 % (ref 0.5–1.5)
Carboxyhemoglobin: 1.3 % (ref 0.5–1.5)
Methemoglobin: 1.3 % (ref 0.0–1.5)
Methemoglobin: 2.2 % — ABNORMAL HIGH (ref 0.0–1.5)
Methemoglobin: 2 % — ABNORMAL HIGH (ref 0.0–1.5)
O2 Saturation: 38 %
O2 Saturation: 44.9 %
Total hemoglobin: 10.3 g/dL — ABNORMAL LOW (ref 12.0–16.0)
Total hemoglobin: 8.9 g/dL — ABNORMAL LOW (ref 12.0–16.0)

## 2012-12-18 LAB — POCT I-STAT, CHEM 8
Creatinine, Ser: 1.3 mg/dL — ABNORMAL HIGH (ref 0.50–1.10)
Glucose, Bld: 161 mg/dL — ABNORMAL HIGH (ref 70–99)
Hemoglobin: 10.2 g/dL — ABNORMAL LOW (ref 12.0–15.0)
TCO2: 22 mmol/L (ref 0–100)

## 2012-12-18 LAB — HEMOGLOBIN AND HEMATOCRIT, BLOOD
HCT: 23.5 % — ABNORMAL LOW (ref 36.0–46.0)
Hemoglobin: 8.3 g/dL — ABNORMAL LOW (ref 12.0–15.0)

## 2012-12-18 LAB — PLATELET COUNT: Platelets: 98 10*3/uL — ABNORMAL LOW (ref 150–400)

## 2012-12-18 LAB — CREATININE, SERUM
Creatinine, Ser: 1.2 mg/dL — ABNORMAL HIGH (ref 0.50–1.10)
GFR calc Af Amer: 54 mL/min — ABNORMAL LOW (ref >90)
GFR calc non Af Amer: 46 mL/min — ABNORMAL LOW (ref >90)

## 2012-12-18 SURGERY — INSERTION OF IMPLANTABLE LEFT VENTRICULAR ASSIST DEVICE
Anesthesia: General | Site: Chest | Wound class: Clean

## 2012-12-18 MED ORDER — VANCOMYCIN HCL 1000 MG IV SOLR
INTRAVENOUS | Status: AC
  Filled 2012-12-18: qty 1000

## 2012-12-18 MED ORDER — LACTATED RINGERS IV SOLN: 500.0000 mL | Freq: Once | INTRAVENOUS | Status: DC | PRN

## 2012-12-18 MED ORDER — EPINEPHRINE HCL 1 MG/ML IJ SOLN
0.0000 ug/min | INTRAVENOUS | Status: AC
  Filled 2012-12-18 (×2): qty 4

## 2012-12-18 MED ORDER — SODIUM CHLORIDE 0.9 % IV SOLN
250.0000 mL | INTRAVENOUS | Status: DC
  Administered 2013-01-04: 20 mL/h via INTRAVENOUS

## 2012-12-18 MED ORDER — SODIUM CHLORIDE 0.9 % IR SOLN
Status: DC | PRN
  Administered 2012-12-18: 6000 mL

## 2012-12-18 MED ORDER — ASPIRIN 300 MG RE SUPP
300.0000 mg | Freq: Every day | RECTAL | Status: DC
  Administered 2012-12-23 – 2012-12-28 (×4): 300 mg via RECTAL
  Filled 2012-12-18 (×21): qty 1

## 2012-12-18 MED ORDER — ETOMIDATE 2 MG/ML IV SOLN
INTRAVENOUS | Status: DC | PRN
  Administered 2012-12-18: 10 mg via INTRAVENOUS

## 2012-12-18 MED ORDER — DOBUTAMINE IN D5W 4-5 MG/ML-% IV SOLN
2.5000 ug/kg/min | INTRAVENOUS | Status: DC
  Filled 2012-12-18: qty 250

## 2012-12-18 MED ORDER — SODIUM CHLORIDE 0.9 % IV SOLN
INTRAVENOUS | Status: DC
  Administered 2012-12-18: 16:00:00 via INTRAVENOUS

## 2012-12-18 MED ORDER — HEPARIN (PORCINE) IN NACL 100-0.45 UNIT/ML-% IJ SOLN
500.0000 [IU]/h | INTRAMUSCULAR | Status: DC
  Filled 2012-12-18: qty 250

## 2012-12-18 MED ORDER — SODIUM CHLORIDE 0.9 % IV SOLN
INTRAVENOUS | Status: DC | PRN
  Administered 2012-12-18: 20:00:00 via INTRAVENOUS

## 2012-12-18 MED ORDER — SODIUM BICARBONATE 8.4 % IV SOLN
50.0000 meq | Freq: Once | INTRAVENOUS | Status: DC
  Filled 2012-12-18: qty 50

## 2012-12-18 MED ORDER — SODIUM CHLORIDE 0.9 % IV SOLN
INTRAVENOUS | Status: DC
  Administered 2012-12-19: 1.4 [IU]/h via INTRAVENOUS
  Administered 2012-12-20: 1.9 [IU]/h via INTRAVENOUS
  Administered 2012-12-20: 1.6 [IU]/h via INTRAVENOUS
  Filled 2012-12-18 (×4): qty 1

## 2012-12-18 MED ORDER — ACETAMINOPHEN 650 MG RE SUPP: 650.0000 mg | Freq: Once | RECTAL | Status: DC

## 2012-12-18 MED ORDER — FLUCONAZOLE IN SODIUM CHLORIDE 400-0.9 MG/200ML-% IV SOLN
400.0000 mg | Freq: Once | INTRAVENOUS | Status: AC
  Administered 2012-12-19: 400 mg via INTRAVENOUS
  Filled 2012-12-18: qty 200

## 2012-12-18 MED ORDER — DEXTROSE 5 % IV SOLN
750.0000 mg | INTRAVENOUS | Status: AC
  Filled 2012-12-18: qty 750

## 2012-12-18 MED ORDER — MIDAZOLAM HCL 5 MG/5ML IJ SOLN
INTRAMUSCULAR | Status: DC | PRN
  Administered 2012-12-18: 2 mg via INTRAVENOUS
  Administered 2012-12-18: 6 mg via INTRAVENOUS
  Administered 2012-12-18: 2 mg via INTRAVENOUS
  Administered 2012-12-18 (×2): 1 mg via INTRAVENOUS

## 2012-12-18 MED ORDER — ACETAMINOPHEN 160 MG/5ML PO SOLN: 650.0000 mg | Freq: Once | ORAL | Status: DC

## 2012-12-18 MED ORDER — MIDAZOLAM HCL 5 MG/5ML IJ SOLN
INTRAMUSCULAR | Status: DC | PRN
  Administered 2012-12-18: 2 mg via INTRAVENOUS

## 2012-12-18 MED ORDER — MAGNESIUM SULFATE 40 MG/ML IJ SOLN
4.0000 g | Freq: Once | INTRAMUSCULAR | Status: AC
  Administered 2012-12-18 (×2): 4 g via INTRAVENOUS
  Filled 2012-12-18: qty 100

## 2012-12-18 MED ORDER — VANCOMYCIN HCL IN DEXTROSE 1-5 GM/200ML-% IV SOLN
1000.0000 mg | Freq: Once | INTRAVENOUS | Status: AC
  Administered 2012-12-18: 1000 mg via INTRAVENOUS
  Filled 2012-12-18: qty 200

## 2012-12-18 MED ORDER — ASPIRIN 81 MG PO CHEW
324.0000 mg | CHEWABLE_TABLET | Freq: Every day | ORAL | Status: DC
  Administered 2012-12-20 – 2012-12-21 (×2): 324 mg
  Filled 2012-12-18 (×2): qty 4

## 2012-12-18 MED ORDER — VANCOMYCIN HCL 1000 MG IV SOLR
INTRAVENOUS | Status: DC | PRN
  Administered 2012-12-18: 1000 mg
  Administered 2012-12-18: 1 g

## 2012-12-18 MED ORDER — SODIUM CHLORIDE 0.9 % IJ SOLN
OROMUCOSAL | Status: DC | PRN
  Administered 2012-12-18 (×3): via TOPICAL

## 2012-12-18 MED ORDER — VECURONIUM BROMIDE 10 MG IV SOLR
INTRAVENOUS | Status: DC | PRN
  Administered 2012-12-18 (×2): 10 mg via INTRAVENOUS

## 2012-12-18 MED ORDER — PHENYLEPHRINE HCL 10 MG/ML IJ SOLN
0.0000 ug/min | INTRAVENOUS | Status: AC
  Filled 2012-12-18: qty 2

## 2012-12-18 MED ORDER — VANCOMYCIN HCL IN DEXTROSE 1-5 GM/200ML-% IV SOLN
1000.0000 mg | INTRAVENOUS | Status: DC
  Filled 2012-12-18: qty 200

## 2012-12-18 MED ORDER — FAMOTIDINE IN NACL 20-0.9 MG/50ML-% IV SOLN
20.0000 mg | Freq: Two times a day (BID) | INTRAVENOUS | Status: AC
  Administered 2012-12-18: 20 mg via INTRAVENOUS

## 2012-12-18 MED ORDER — HEPARIN SODIUM (PORCINE) 1000 UNIT/ML IJ SOLN
INTRAMUSCULAR | Status: DC | PRN
  Administered 2012-12-18: 38000 [IU] via INTRAVENOUS

## 2012-12-18 MED ORDER — DEXMEDETOMIDINE HCL IN NACL 400 MCG/100ML IV SOLN
0.4000 ug/kg/h | INTRAVENOUS | Status: AC
  Administered 2012-12-18: 0.7 ug/kg/h via INTRAVENOUS
  Filled 2012-12-18: qty 100

## 2012-12-18 MED ORDER — LACTATED RINGERS IV SOLN
INTRAVENOUS | Status: DC | PRN
  Administered 2012-12-18 (×2): via INTRAVENOUS

## 2012-12-18 MED ORDER — AMIODARONE HCL IN DEXTROSE 360-4.14 MG/200ML-% IV SOLN
30.0000 mg/h | INTRAVENOUS | Status: DC
  Administered 2012-12-18 – 2012-12-20 (×4): 30 mg/h via INTRAVENOUS
  Administered 2012-12-20: 15 mg/h via INTRAVENOUS
  Filled 2012-12-18 (×11): qty 200

## 2012-12-18 MED ORDER — CALCIUM CHLORIDE 10 % IV SOLN
INTRAVENOUS | Status: DC | PRN
  Administered 2012-12-18: 1 g via INTRAVENOUS

## 2012-12-18 MED ORDER — VANCOMYCIN HCL 10 G IV SOLR: 1250.0000 mg | INTRAVENOUS | Status: DC

## 2012-12-18 MED ORDER — SODIUM BICARBONATE 4.2 % IV SOLN
INTRAVENOUS | Status: DC | PRN
  Administered 2012-12-18: 50 meq via INTRAVENOUS

## 2012-12-18 MED ORDER — VASOPRESSIN 20 UNIT/ML IJ SOLN
0.0100 [IU]/min | INTRAVENOUS | Status: DC | PRN
  Filled 2012-12-18: qty 2.5

## 2012-12-18 MED ORDER — MIDAZOLAM HCL 2 MG/2ML IJ SOLN
2.0000 mg | INTRAMUSCULAR | Status: DC | PRN
  Administered 2012-12-18 – 2012-12-19 (×4): 2 mg via INTRAVENOUS
  Filled 2012-12-18 (×2): qty 2
  Filled 2012-12-18: qty 4

## 2012-12-18 MED ORDER — MAGNESIUM SULFATE 50 % IJ SOLN
40.0000 meq | INTRAMUSCULAR | Status: AC
  Filled 2012-12-18: qty 10

## 2012-12-18 MED ORDER — ARTIFICIAL TEARS OP OINT
TOPICAL_OINTMENT | OPHTHALMIC | Status: DC | PRN
  Administered 2012-12-18: 1 via OPHTHALMIC

## 2012-12-18 MED ORDER — LIDOCAINE HCL (CARDIAC) 20 MG/ML IV SOLN
INTRAVENOUS | Status: DC | PRN
  Administered 2012-12-18: 30 mg via INTRAVENOUS

## 2012-12-18 MED ORDER — SODIUM CHLORIDE 0.9 % IV SOLN
10.0000 g | INTRAVENOUS | Status: DC | PRN
  Administered 2012-12-18: 5 g/h via INTRAVENOUS

## 2012-12-18 MED ORDER — AMIODARONE IV BOLUS ONLY 150 MG/100ML
INTRAVENOUS | Status: DC | PRN
  Administered 2012-12-18: 150 mg via INTRAVENOUS

## 2012-12-18 MED ORDER — PROTAMINE SULFATE 10 MG/ML IV SOLN
INTRAVENOUS | Status: DC | PRN
  Administered 2012-12-18: 50 mg via INTRAVENOUS
  Administered 2012-12-18: 30 mg via INTRAVENOUS
  Administered 2012-12-18: 40 mg via INTRAVENOUS
  Administered 2012-12-18: 20 mg via INTRAVENOUS
  Administered 2012-12-18: 30 mg via INTRAVENOUS
  Administered 2012-12-18: 20 mg via INTRAVENOUS

## 2012-12-18 MED ORDER — NOREPINEPHRINE BITARTRATE 1 MG/ML IJ SOLN
0.0000 ug/min | INTRAVENOUS | Status: AC
  Filled 2012-12-18: qty 8

## 2012-12-18 MED ORDER — POTASSIUM CHLORIDE 10 MEQ/50ML IV SOLN
10.0000 meq | INTRAVENOUS | Status: AC
  Administered 2012-12-19 (×4): 10 meq via INTRAVENOUS

## 2012-12-18 MED ORDER — MAGNESIUM SULFATE 40 MG/ML IJ SOLN
2.0000 g | Freq: Once | INTRAMUSCULAR | Status: DC
  Filled 2012-12-18 (×2): qty 50

## 2012-12-18 MED ORDER — HEMOSTATIC AGENTS (NO CHARGE) OPTIME
TOPICAL | Status: DC | PRN
  Administered 2012-12-18: 1 via TOPICAL

## 2012-12-18 MED ORDER — ROCURONIUM BROMIDE 100 MG/10ML IV SOLN
INTRAVENOUS | Status: DC | PRN
  Administered 2012-12-18: 50 mg via INTRAVENOUS

## 2012-12-18 MED ORDER — ACETAMINOPHEN 500 MG PO TABS
1000.0000 mg | ORAL_TABLET | Freq: Four times a day (QID) | ORAL | Status: DC
  Administered 2012-12-19: 1000 mg via ORAL
  Filled 2012-12-18 (×4): qty 2

## 2012-12-18 MED ORDER — PHENYLEPHRINE HCL 10 MG/ML IJ SOLN
0.0000 ug/min | INTRAVENOUS | Status: DC
  Administered 2012-12-19 – 2012-12-21 (×4): 20 ug/min via INTRAVENOUS
  Administered 2012-12-21: 35 ug/min via INTRAVENOUS
  Administered 2012-12-22: 35.067 ug/min via INTRAVENOUS
  Administered 2012-12-23 (×2): 35 ug/min via INTRAVENOUS
  Filled 2012-12-18 (×9): qty 2

## 2012-12-18 MED ORDER — EPINEPHRINE HCL 1 MG/ML IJ SOLN
5.0000 ug/min | INTRAVENOUS | Status: DC
  Administered 2012-12-19: 4 ug/min via INTRAVENOUS
  Administered 2012-12-19: 5 ug/min via INTRAVENOUS
  Administered 2012-12-20 – 2012-12-21 (×2): 1 ug/min via INTRAVENOUS
  Filled 2012-12-18 (×5): qty 4

## 2012-12-18 MED ORDER — DEXTROSE 5 % IV SOLN: 1.5000 g | INTRAVENOUS | Status: DC

## 2012-12-18 MED ORDER — DEXTROSE 5 % IV SOLN
1.5000 g | Freq: Two times a day (BID) | INTRAVENOUS | Status: DC
  Filled 2012-12-18: qty 1.5

## 2012-12-18 MED ORDER — POTASSIUM CHLORIDE 10 MEQ/50ML IV SOLN
INTRAVENOUS | Status: AC
  Administered 2012-12-18: 10 meq via INTRAVENOUS
  Filled 2012-12-18: qty 150

## 2012-12-18 MED ORDER — ALBUMIN HUMAN 5 % IV SOLN
INTRAVENOUS | Status: DC | PRN
  Administered 2012-12-18 (×2): via INTRAVENOUS

## 2012-12-18 MED ORDER — DOPAMINE-DEXTROSE 3.2-5 MG/ML-% IV SOLN
3.0000 ug/kg/min | INTRAVENOUS | Status: DC
  Administered 2012-12-20: 3 ug/kg/min via INTRAVENOUS
  Administered 2012-12-25: 2.991 ug/kg/min via INTRAVENOUS
  Administered 2012-12-25: 3 ug/kg/min via INTRAVENOUS
  Administered 2012-12-28: 4 ug/kg/min via INTRAVENOUS
  Administered 2012-12-30: 3 ug/kg/min via INTRAVENOUS
  Administered 2013-01-01: 2.991 ug/kg/min via INTRAVENOUS
  Administered 2013-01-03: 3 ug/kg/min via INTRAVENOUS
  Filled 2012-12-18 (×8): qty 250

## 2012-12-18 MED ORDER — MORPHINE SULFATE 2 MG/ML IJ SOLN
1.0000 mg | INTRAMUSCULAR | Status: AC | PRN
  Administered 2012-12-18 (×2): 2 mg via INTRAVENOUS
  Filled 2012-12-18: qty 1

## 2012-12-18 MED ORDER — SODIUM CHLORIDE 0.45 % IV SOLN
INTRAVENOUS | Status: DC
  Administered 2012-12-18: 16:00:00 via INTRAVENOUS
  Administered 2012-12-21 – 2012-12-28 (×3): 20 mL/h via INTRAVENOUS

## 2012-12-18 MED ORDER — NOREPINEPHRINE BITARTRATE 1 MG/ML IJ SOLN
5.0000 ug/min | INTRAVENOUS | Status: DC
  Administered 2012-12-19 – 2012-12-20 (×2): 4 ug/min via INTRAVENOUS
  Administered 2012-12-21 – 2012-12-23 (×3): 8 ug/min via INTRAVENOUS
  Filled 2012-12-18 (×5): qty 8

## 2012-12-18 MED ORDER — DOCUSATE SODIUM 100 MG PO CAPS: 200.0000 mg | ORAL_CAPSULE | Freq: Every day | ORAL | Status: DC

## 2012-12-18 MED ORDER — PANTOPRAZOLE SODIUM 40 MG PO TBEC: 40.0000 mg | DELAYED_RELEASE_TABLET | Freq: Every day | ORAL | Status: DC

## 2012-12-18 MED ORDER — ASPIRIN EC 325 MG PO TBEC
325.0000 mg | DELAYED_RELEASE_TABLET | Freq: Every day | ORAL | Status: DC
  Filled 2012-12-18 (×4): qty 1

## 2012-12-18 MED ORDER — SODIUM CHLORIDE 0.9 % IJ SOLN
OROMUCOSAL | Status: DC | PRN
  Administered 2012-12-18 (×3): via TOPICAL

## 2012-12-18 MED ORDER — HEPARIN SODIUM (PORCINE) 1000 UNIT/ML IJ SOLN
INTRAMUSCULAR | Status: DC | PRN
  Administered 2012-12-18: 15000 [IU] via INTRAVENOUS

## 2012-12-18 MED ORDER — NITROGLYCERIN IN D5W 200-5 MCG/ML-% IV SOLN: 0.0000 ug/min | INTRAVENOUS | Status: AC

## 2012-12-18 MED ORDER — OXYCODONE HCL 5 MG PO TABS: 5.0000 mg | ORAL_TABLET | ORAL | Status: DC | PRN

## 2012-12-18 MED ORDER — SODIUM CHLORIDE 0.9 % IV SOLN
INTRAVENOUS | Status: AC
  Administered 2012-12-18: 3.7 [IU]/h via INTRAVENOUS
  Filled 2012-12-18 (×2): qty 1

## 2012-12-18 MED ORDER — SODIUM CHLORIDE 0.9 % IR SOLN
Status: DC | PRN
  Administered 2012-12-18: 5000 mL

## 2012-12-18 MED ORDER — INSULIN REGULAR BOLUS VIA INFUSION
0.0000 [IU] | Freq: Three times a day (TID) | INTRAVENOUS | Status: DC
  Administered 2012-12-20: 1.6 [IU]/h via INTRAVENOUS
  Filled 2012-12-18: qty 10

## 2012-12-18 MED ORDER — NITROGLYCERIN IN D5W 200-5 MCG/ML-% IV SOLN: 0.0000 ug/min | INTRAVENOUS | Status: DC

## 2012-12-18 MED ORDER — POTASSIUM CHLORIDE 10 MEQ/50ML IV SOLN
10.0000 meq | INTRAVENOUS | Status: AC
  Administered 2012-12-18 (×4): 10 meq via INTRAVENOUS

## 2012-12-18 MED ORDER — HEPARIN BOLUS VIA INFUSION
500.0000 [IU] | INTRAVENOUS | Status: DC | PRN
  Filled 2012-12-18: qty 1000

## 2012-12-18 MED ORDER — VANCOMYCIN HCL IN DEXTROSE 1-5 GM/200ML-% IV SOLN
1000.0000 mg | INTRAVENOUS | Status: DC
  Administered 2012-12-19: 1000 mg via INTRAVENOUS
  Filled 2012-12-18: qty 200

## 2012-12-18 MED ORDER — BISACODYL 5 MG PO TBEC
10.0000 mg | DELAYED_RELEASE_TABLET | Freq: Every day | ORAL | Status: DC
  Administered 2012-12-28: 10 mg via ORAL
  Filled 2012-12-18: qty 2

## 2012-12-18 MED ORDER — MORPHINE SULFATE 2 MG/ML IJ SOLN
2.0000 mg | INTRAMUSCULAR | Status: DC | PRN
  Administered 2012-12-19 – 2012-12-31 (×3): 2 mg via INTRAVENOUS
  Administered 2013-01-01 (×5): 4 mg via INTRAVENOUS
  Filled 2012-12-18: qty 2
  Filled 2012-12-18 (×4): qty 1
  Filled 2012-12-18 (×4): qty 2
  Filled 2012-12-18: qty 1

## 2012-12-18 MED ORDER — LACTATED RINGERS IV SOLN
INTRAVENOUS | Status: DC | PRN
  Administered 2012-12-18: 19:00:00 via INTRAVENOUS

## 2012-12-18 MED ORDER — DOPAMINE-DEXTROSE 3.2-5 MG/ML-% IV SOLN: 0.0000 ug/kg/min | INTRAVENOUS | Status: AC

## 2012-12-18 MED ORDER — DOBUTAMINE IN D5W 4-5 MG/ML-% IV SOLN
2.0000 ug/kg/min | INTRAVENOUS | Status: AC
  Filled 2012-12-18: qty 250

## 2012-12-18 MED ORDER — FLUCONAZOLE IN SODIUM CHLORIDE 400-0.9 MG/200ML-% IV SOLN: 400.0000 mg | INTRAVENOUS | Status: DC

## 2012-12-18 MED ORDER — SUFENTANIL CITRATE 50 MCG/ML IV SOLN
INTRAVENOUS | Status: DC | PRN
  Administered 2012-12-18: 10 ug via INTRAVENOUS

## 2012-12-18 MED ORDER — POTASSIUM CHLORIDE 2 MEQ/ML IV SOLN
80.0000 meq | INTRAVENOUS | Status: AC
  Filled 2012-12-18: qty 40

## 2012-12-18 MED ORDER — DEXMEDETOMIDINE HCL IN NACL 400 MCG/100ML IV SOLN
0.1000 ug/kg/h | INTRAVENOUS | Status: AC
  Administered 2012-12-18: 0.7 ug/kg/h via INTRAVENOUS
  Filled 2012-12-18: qty 100

## 2012-12-18 MED ORDER — SODIUM CHLORIDE 0.9 % IJ SOLN: 3.0000 mL | INTRAMUSCULAR | Status: DC | PRN

## 2012-12-18 MED ORDER — SODIUM CHLORIDE 0.9 % IR SOLN
Status: DC | PRN
  Administered 2012-12-18: 10:00:00

## 2012-12-18 MED ORDER — PROTAMINE SULFATE 10 MG/ML IV SOLN
INTRAVENOUS | Status: DC | PRN
  Administered 2012-12-18: 150 mg via INTRAVENOUS

## 2012-12-18 MED ORDER — ALBUMIN HUMAN 5 % IV SOLN
INTRAVENOUS | Status: DC | PRN
  Administered 2012-12-18: 22:00:00 via INTRAVENOUS

## 2012-12-18 MED ORDER — SODIUM CHLORIDE 0.9 % IV SOLN
INTRAVENOUS | Status: AC
  Filled 2012-12-18: qty 30

## 2012-12-18 MED ORDER — MUPIROCIN 2 % EX OINT
1.0000 "application " | TOPICAL_OINTMENT | Freq: Two times a day (BID) | CUTANEOUS | Status: DC
  Administered 2012-12-19 – 2012-12-28 (×18): 1 via TOPICAL
  Filled 2012-12-18: qty 22

## 2012-12-18 MED ORDER — VANCOMYCIN HCL 1000 MG IV SOLR
1000.0000 mg | INTRAVENOUS | Status: DC
  Filled 2012-12-18: qty 1000

## 2012-12-18 MED ORDER — VASOPRESSIN 20 UNIT/ML IJ SOLN
0.0400 [IU]/min | INTRAVENOUS | Status: AC
  Filled 2012-12-18 (×2): qty 2.5

## 2012-12-18 MED ORDER — HEPARIN (PORCINE) IN NACL 100-0.45 UNIT/ML-% IJ SOLN
500.0000 [IU]/h | INTRAMUSCULAR | Status: DC
  Filled 2012-12-18 (×2): qty 250

## 2012-12-18 MED ORDER — SODIUM CHLORIDE 0.9 % IV SOLN
INTRAVENOUS | Status: DC
  Filled 2012-12-18: qty 40

## 2012-12-18 MED ORDER — ACETAMINOPHEN 160 MG/5ML PO SOLN: 1000.0000 mg | Freq: Four times a day (QID) | ORAL | Status: DC

## 2012-12-18 MED ORDER — MILRINONE IN DEXTROSE 20 MG/100ML IV SOLN: 0.3000 ug/kg/min | INTRAVENOUS | Status: AC

## 2012-12-18 MED ORDER — LACTATED RINGERS IV SOLN
INTRAVENOUS | Status: DC
  Administered 2012-12-25: 08:00:00 via INTRAVENOUS

## 2012-12-18 MED ORDER — RIFAMPIN 300 MG PO CAPS
600.0000 mg | ORAL_CAPSULE | Freq: Once | ORAL | Status: AC
  Administered 2012-12-19: 600 mg via ORAL
  Filled 2012-12-18: qty 2

## 2012-12-18 MED ORDER — SODIUM CHLORIDE 0.9 % IJ SOLN
3.0000 mL | Freq: Two times a day (BID) | INTRAMUSCULAR | Status: DC
  Administered 2012-12-19: 3 mL via INTRAVENOUS

## 2012-12-18 MED ORDER — DEXMEDETOMIDINE HCL IN NACL 200 MCG/50ML IV SOLN
0.1000 ug/kg/h | INTRAVENOUS | Status: DC
  Administered 2012-12-19 (×2): 0.7 ug/kg/h via INTRAVENOUS
  Administered 2012-12-20 (×2): 0.3 ug/kg/h via INTRAVENOUS
  Administered 2012-12-21 – 2012-12-23 (×5): 0.2 ug/kg/h via INTRAVENOUS
  Filled 2012-12-18 (×11): qty 50

## 2012-12-18 MED ORDER — VANCOMYCIN HCL IN DEXTROSE 1-5 GM/200ML-% IV SOLN
1000.0000 mg | Freq: Two times a day (BID) | INTRAVENOUS | Status: DC
  Filled 2012-12-18: qty 200

## 2012-12-18 MED ORDER — ONDANSETRON HCL 4 MG/2ML IJ SOLN
4.0000 mg | Freq: Four times a day (QID) | INTRAMUSCULAR | Status: DC | PRN
  Administered 2012-12-23: 4 mg via INTRAVENOUS
  Filled 2012-12-18 (×5): qty 2

## 2012-12-18 MED ORDER — ALBUMIN HUMAN 5 % IV SOLN
250.0000 mL | INTRAVENOUS | Status: AC | PRN
  Administered 2012-12-18 – 2012-12-19 (×4): 250 mL via INTRAVENOUS
  Filled 2012-12-18: qty 500

## 2012-12-18 MED ORDER — BISACODYL 10 MG RE SUPP
10.0000 mg | Freq: Every day | RECTAL | Status: DC
  Administered 2012-12-20 – 2012-12-26 (×2): 10 mg via RECTAL
  Filled 2012-12-18 (×3): qty 1

## 2012-12-18 SURGICAL SUPPLY — 118 items
ADAPTER CARDIO PERF ANTE/RETRO (ADAPTER) IMPLANT
ADAPTER DLP PERFUSION .25INX2I (MISCELLANEOUS) ×3 IMPLANT
ANTEGRADE CPLG (MISCELLANEOUS) IMPLANT
ATTRACTOMAT 16X20 MAGNETIC DRP (DRAPES) ×3 IMPLANT
BAG DECANTER FOR FLEXI CONT (MISCELLANEOUS) ×9 IMPLANT
BLADE STERNUM SYSTEM 6 (BLADE) ×3 IMPLANT
BLADE SURG 12 STRL SS (BLADE) ×3 IMPLANT
BLADE SURG 15 STRL LF DISP TIS (BLADE) IMPLANT
BLADE SURG 15 STRL SS (BLADE)
CANISTER SUCTION 2500CC (MISCELLANEOUS) ×3 IMPLANT
CANISTER WOUND CARE 500ML ATS (WOUND CARE) ×3 IMPLANT
CANNULA ARTERIAL NVNT 3/8 20FR (MISCELLANEOUS) ×3 IMPLANT
CANNULA EZ GLIDE AORTIC 21FR (CANNULA) ×3 IMPLANT
CANNULA FEM VENOUS REMOTE 22FR (CANNULA) ×6 IMPLANT
CANNULA MALLEABLE SINGLE 40FR (CANNULA) ×3 IMPLANT
CANNULA VENOUS LOW PROF 34X46 (CANNULA) ×3 IMPLANT
CATH CPB KIT VANTRIGT (MISCELLANEOUS) IMPLANT
CATH FOLEY 2WAY SLVR  5CC 14FR (CATHETERS) ×2
CATH FOLEY 2WAY SLVR 5CC 14FR (CATHETERS) ×1 IMPLANT
CATH HEART VENT LEFT (CATHETERS) IMPLANT
CATH HYDRAGLIDE XL THORACIC (CATHETERS) ×3 IMPLANT
CATH ROBINSON RED A/P 18FR (CATHETERS) ×6 IMPLANT
CATH THORACIC 28FR (CATHETERS) IMPLANT
CATH THORACIC 36FR RT ANG (CATHETERS) IMPLANT
CHLORAPREP W/TINT 26ML (MISCELLANEOUS) ×3 IMPLANT
CLOTH BEACON ORANGE TIMEOUT ST (SAFETY) ×3 IMPLANT
CONN 3/8X3/8 GISH STERILE (MISCELLANEOUS) ×3 IMPLANT
CONN ST 1/4X3/8  BEN (MISCELLANEOUS) ×4
CONN ST 1/4X3/8 BEN (MISCELLANEOUS) ×2 IMPLANT
COVER SURGICAL LIGHT HANDLE (MISCELLANEOUS) ×3 IMPLANT
CRADLE DONUT ADULT HEAD (MISCELLANEOUS) ×3 IMPLANT
DRAIN CHANNEL 28F RND 3/8 FF (WOUND CARE) IMPLANT
DRAIN CHANNEL 32F RND 10.7 FF (WOUND CARE) IMPLANT
DRAPE BILATERAL SPLIT (DRAPES) ×3 IMPLANT
DRAPE CV SPLIT W-CLR ANES SCRN (DRAPES) ×3 IMPLANT
DRAPE INCISE IOBAN 66X45 STRL (DRAPES) ×3 IMPLANT
DRAPE SLUSH/WARMER DISC (DRAPES) ×3 IMPLANT
DRSG AQUACEL AG ADV 3.5X10 (GAUZE/BANDAGES/DRESSINGS) ×3 IMPLANT
DRSG COVADERM 4X14 (GAUZE/BANDAGES/DRESSINGS) ×3 IMPLANT
DRSG MEPITEL 4X7.2 (GAUZE/BANDAGES/DRESSINGS) ×3 IMPLANT
ELECT BLADE 4.0 EZ CLEAN MEGAD (MISCELLANEOUS) ×3
ELECT BLADE 6.5 EXT (BLADE) ×3 IMPLANT
ELECT CAUTERY BLADE 6.4 (BLADE) ×3 IMPLANT
ELECT REM PT RETURN 9FT ADLT (ELECTROSURGICAL) ×3
ELECTRODE BLDE 4.0 EZ CLN MEGD (MISCELLANEOUS) ×1 IMPLANT
ELECTRODE REM PT RTRN 9FT ADLT (ELECTROSURGICAL) ×1 IMPLANT
FELT TEFLON 6X6 (MISCELLANEOUS) IMPLANT
GLOVE BIO SURGEON STRL SZ 6.5 (GLOVE) ×4 IMPLANT
GLOVE BIO SURGEON STRL SZ7 (GLOVE) ×3 IMPLANT
GLOVE BIO SURGEON STRL SZ7.5 (GLOVE) ×12 IMPLANT
GLOVE BIO SURGEONS STRL SZ 6.5 (GLOVE) ×2
GLOVE BIOGEL PI IND STRL 7.0 (GLOVE) ×1 IMPLANT
GLOVE BIOGEL PI INDICATOR 7.0 (GLOVE) ×2
GLOVE EUDERMIC 7 POWDERFREE (GLOVE) ×3 IMPLANT
GOWN PREVENTION PLUS XLARGE (GOWN DISPOSABLE) ×6 IMPLANT
GOWN STRL NON-REIN LRG LVL3 (GOWN DISPOSABLE) ×12 IMPLANT
HEMOSTAT POWDER SURGIFOAM 1G (HEMOSTASIS) ×9 IMPLANT
HEMOSTAT SURGICEL 2X14 (HEMOSTASIS) IMPLANT
HM II VENTR. ASSIST DEVICE BP (Prosthesis & Implant Heart) ×3 IMPLANT
INSERT FOGARTY XLG (MISCELLANEOUS) ×3 IMPLANT
KIT BASIN OR (CUSTOM PROCEDURE TRAY) ×3 IMPLANT
KIT DILATOR VASC 18G NDL (KITS) ×3 IMPLANT
KIT DRAINAGE VACCUM ASSIST (KITS) ×3 IMPLANT
KIT ROOM TURNOVER OR (KITS) ×3 IMPLANT
KIT SUCTION CATH 14FR (SUCTIONS) ×6 IMPLANT
KIT TOURNIQUET VASCULAR (MISCELLANEOUS) ×6 IMPLANT
LEAD PACING MYOCARDI (MISCELLANEOUS) ×3 IMPLANT
LINE VENT (MISCELLANEOUS) ×3 IMPLANT
NS IRRIG 1000ML POUR BTL (IV SOLUTION) ×15 IMPLANT
PACK OPEN HEART (CUSTOM PROCEDURE TRAY) ×3 IMPLANT
PAD ARMBOARD 7.5X6 YLW CONV (MISCELLANEOUS) ×6 IMPLANT
PAD DEFIB R2 (MISCELLANEOUS) ×3 IMPLANT
PUMP BLOOD CENTRIMAG (PUMP) ×3 IMPLANT
PUNCH AORTIC ROTATE 4.5MM 8IN (MISCELLANEOUS) ×3 IMPLANT
SEALANT SURG COSEAL 8ML (VASCULAR PRODUCTS) ×6 IMPLANT
SET CARDIOPLEGIA MPS 5001102 (MISCELLANEOUS) ×3 IMPLANT
SHEATH AVANTI 11CM 5FR (MISCELLANEOUS) ×3 IMPLANT
SPOGE SURGIFLO 8M (HEMOSTASIS) ×2
SPONGE GAUZE 4X4 12PLY (GAUZE/BANDAGES/DRESSINGS) ×3 IMPLANT
SPONGE SURGIFLO 8M (HEMOSTASIS) ×1 IMPLANT
STOPCOCK 4 WAY LG BORE MALE ST (IV SETS) ×3 IMPLANT
SUCKER INTRACARDIAC WEIGHTED (SUCKER) ×3 IMPLANT
SUT BONE WAX W31G (SUTURE) ×3 IMPLANT
SUT ETHIBOND 2 0 SH (SUTURE) ×12
SUT ETHIBOND 2 0 SH 36X2 (SUTURE) ×6 IMPLANT
SUT ETHIBOND 5 LR DA (SUTURE) ×6 IMPLANT
SUT ETHIBOND NAB MH 2-0 36IN (SUTURE) ×36 IMPLANT
SUT ETHILON 3 0 FSL (SUTURE) ×9 IMPLANT
SUT PDS AB 1 CTX 36 (SUTURE) IMPLANT
SUT PDS AB 3-0 SH 27 (SUTURE) IMPLANT
SUT PROLENE 3 0 SH DA (SUTURE) ×6 IMPLANT
SUT PROLENE 4 0 RB 1 (SUTURE) ×4
SUT PROLENE 4 0 SH DA (SUTURE) ×9 IMPLANT
SUT PROLENE 4-0 RB1 .5 CRCL 36 (SUTURE) ×2 IMPLANT
SUT SILK  1 MH (SUTURE) ×16
SUT SILK 1 MH (SUTURE) ×8 IMPLANT
SUT SILK 1 TIES 10X30 (SUTURE) ×3 IMPLANT
SUT SILK 2 0 SH CR/8 (SUTURE) ×6 IMPLANT
SUT STEEL 6MS V (SUTURE) ×6 IMPLANT
SUT STEEL SZ 6 DBL 3X14 BALL (SUTURE) ×3 IMPLANT
SUT TEM PAC WIRE 2 0 SH (SUTURE) IMPLANT
SUT VIC AB 1 CTX 36 (SUTURE) ×4
SUT VIC AB 1 CTX36XBRD ANBCTR (SUTURE) ×2 IMPLANT
SUT VIC AB 2-0 CTX 27 (SUTURE) ×6 IMPLANT
SUT VIC AB 3-0 SH 8-18 (SUTURE) ×3 IMPLANT
SUT VIC AB 3-0 X1 27 (SUTURE) ×6 IMPLANT
SUT VICRYL 2 TP 1 (SUTURE) ×3 IMPLANT
SYR 50ML LL SCALE MARK (SYRINGE) ×3 IMPLANT
SYSTEM SAHARA CHEST DRAIN ATS (WOUND CARE) ×3 IMPLANT
TAPE CLOTH SURG 4X10 WHT LF (GAUZE/BANDAGES/DRESSINGS) ×3 IMPLANT
TOWEL OR 17X24 6PK STRL BLUE (TOWEL DISPOSABLE) ×6 IMPLANT
TOWEL OR 17X26 10 PK STRL BLUE (TOWEL DISPOSABLE) ×6 IMPLANT
TRAY CATH LUMEN 1 20CM STRL (SET/KITS/TRAYS/PACK) IMPLANT
TRAY FOLEY IC TEMP SENS 14FR (CATHETERS) ×3 IMPLANT
TUBE SUCT INTRACARD DLP 20F (MISCELLANEOUS) ×3 IMPLANT
UNDERPAD 30X30 INCONTINENT (UNDERPADS AND DIAPERS) ×3 IMPLANT
VENT LEFT HEART 12002 (CATHETERS)
WATER STERILE IRR 1000ML POUR (IV SOLUTION) ×6 IMPLANT

## 2012-12-18 SURGICAL SUPPLY — 127 items
ADAPTER CARDIO PERF ANTE/RETRO (ADAPTER) IMPLANT
ADAPTER DLP PERFUSION .25INX2I (MISCELLANEOUS) ×4 IMPLANT
ANTEGRADE CPLG (MISCELLANEOUS) IMPLANT
ATTRACTOMAT 16X20 MAGNETIC DRP (DRAPES) ×4 IMPLANT
BAG DECANTER FOR FLEXI CONT (MISCELLANEOUS) ×12 IMPLANT
BLADE STERNUM SYSTEM 6 (BLADE) ×4 IMPLANT
BLADE SURG 12 STRL SS (BLADE) ×4 IMPLANT
BLADE SURG 15 STRL LF DISP TIS (BLADE) ×2 IMPLANT
BLADE SURG 15 STRL SS (BLADE) ×2
CANISTER SUCTION 2500CC (MISCELLANEOUS) ×4 IMPLANT
CANN PRFSN 3/8X14X24FR PCFC (MISCELLANEOUS) ×2
CANN PRFSN 3/8XCNCT ST RT ANG (MISCELLANEOUS) ×2
CANN PRFSN 3/8XRT ANG TPR 14 (MISCELLANEOUS) ×2
CANNULA ARTERIAL NVNT 3/8 20FR (MISCELLANEOUS) ×4 IMPLANT
CANNULA EZ GLIDE AORTIC 21FR (CANNULA) ×4 IMPLANT
CANNULA PRFSN 3/8X14X24FR PCFC (MISCELLANEOUS) ×2 IMPLANT
CANNULA PRFSN 3/8XCNCT RT ANG (MISCELLANEOUS) ×2 IMPLANT
CANNULA PRFSN 3/8XRT ANG TPR14 (MISCELLANEOUS) ×2 IMPLANT
CANNULA VEN MTL TIP RT (MISCELLANEOUS) ×6
CANNULA VENOUS LOW PROF 34X46 (CANNULA) ×4 IMPLANT
CATH CPB KIT VANTRIGT (MISCELLANEOUS) IMPLANT
CATH FOLEY 2WAY SLVR  5CC 14FR (CATHETERS) ×4
CATH FOLEY 2WAY SLVR 5CC 14FR (CATHETERS) ×4 IMPLANT
CATH HEART VENT LEFT (CATHETERS) IMPLANT
CATH HYDRAGLIDE XL THORACIC (CATHETERS) IMPLANT
CATH ROBINSON RED A/P 18FR (CATHETERS) ×12 IMPLANT
CATH THORACIC 28FR (CATHETERS) IMPLANT
CATH THORACIC 36FR RT ANG (CATHETERS) ×4 IMPLANT
CHLORAPREP W/TINT 26ML (MISCELLANEOUS) ×4 IMPLANT
CLOTH BEACON ORANGE TIMEOUT ST (SAFETY) ×4 IMPLANT
CONN 1/2X1/2X1/2  BEN (MISCELLANEOUS) ×2
CONN 1/2X1/2X1/2 BEN (MISCELLANEOUS) ×2 IMPLANT
CONN 3/8X1/2 ST GISH (MISCELLANEOUS) ×8 IMPLANT
CONN ST 1/4X3/8  BEN (MISCELLANEOUS) ×4
CONN ST 1/4X3/8 BEN (MISCELLANEOUS) ×4 IMPLANT
CONT SPEC 4OZ CLIKSEAL STRL BL (MISCELLANEOUS) ×4 IMPLANT
COVER SURGICAL LIGHT HANDLE (MISCELLANEOUS) ×8 IMPLANT
CRADLE DONUT ADULT HEAD (MISCELLANEOUS) ×4 IMPLANT
DRAIN CHANNEL 28F RND 3/8 FF (WOUND CARE) IMPLANT
DRAIN CHANNEL 32F RND 10.7 FF (WOUND CARE) IMPLANT
DRAPE BILATERAL SPLIT (DRAPES) ×4 IMPLANT
DRAPE CV SPLIT W-CLR ANES SCRN (DRAPES) ×4 IMPLANT
DRAPE INCISE IOBAN 66X45 STRL (DRAPES) ×8 IMPLANT
DRAPE SLUSH/WARMER DISC (DRAPES) ×4 IMPLANT
DRSG AQUACEL AG ADV 3.5X10 (GAUZE/BANDAGES/DRESSINGS) ×4 IMPLANT
DRSG COVADERM 4X14 (GAUZE/BANDAGES/DRESSINGS) ×4 IMPLANT
ELECT BLADE 4.0 EZ CLEAN MEGAD (MISCELLANEOUS) ×4
ELECT BLADE 6.5 EXT (BLADE) ×4 IMPLANT
ELECT CAUTERY BLADE 6.4 (BLADE) ×4 IMPLANT
ELECT REM PT RETURN 9FT ADLT (ELECTROSURGICAL) ×4
ELECTRODE BLDE 4.0 EZ CLN MEGD (MISCELLANEOUS) ×2 IMPLANT
ELECTRODE REM PT RTRN 9FT ADLT (ELECTROSURGICAL) ×2 IMPLANT
FELT TEFLON 6X6 (MISCELLANEOUS) IMPLANT
GLOVE BIO SURGEON STRL SZ 6 (GLOVE) ×12 IMPLANT
GLOVE BIO SURGEON STRL SZ 6.5 (GLOVE) ×3 IMPLANT
GLOVE BIO SURGEON STRL SZ7.5 (GLOVE) ×20 IMPLANT
GLOVE BIO SURGEONS STRL SZ 6.5 (GLOVE) ×1
GLOVE SS N UNI LF 7.0 STRL (GLOVE) ×4 IMPLANT
GOWN PREVENTION PLUS XLARGE (GOWN DISPOSABLE) ×8 IMPLANT
GOWN STRL NON-REIN LRG LVL3 (GOWN DISPOSABLE) ×24 IMPLANT
HEMOSTAT POWDER SURGIFOAM 1G (HEMOSTASIS) ×16 IMPLANT
HEMOSTAT SURGICEL 2X14 (HEMOSTASIS) IMPLANT
HM II SYSTEM CONTROLLER ×4 IMPLANT
HM II VENTR. ASSIST DEVICE BP (Prosthesis & Implant Heart) ×4 IMPLANT
INSERT FOGARTY XLG (MISCELLANEOUS) ×4 IMPLANT
KIT BASIN OR (CUSTOM PROCEDURE TRAY) ×4 IMPLANT
KIT ROOM TURNOVER OR (KITS) ×4 IMPLANT
KIT SUCTION CATH 14FR (SUCTIONS) ×8 IMPLANT
LEAD PACING MYOCARDI (MISCELLANEOUS) ×4 IMPLANT
LOOP VESSEL SUPERMAXI WHITE (MISCELLANEOUS) ×4 IMPLANT
NS IRRIG 1000ML POUR BTL (IV SOLUTION) ×32 IMPLANT
PACK OPEN HEART (CUSTOM PROCEDURE TRAY) ×4 IMPLANT
PAD ARMBOARD 7.5X6 YLW CONV (MISCELLANEOUS) ×8 IMPLANT
PAD DEFIB R2 (MISCELLANEOUS) ×4 IMPLANT
PUNCH AORTIC ROTATE 4.5MM 8IN (MISCELLANEOUS) ×4 IMPLANT
SEALANT SURG COSEAL 8ML (VASCULAR PRODUCTS) ×8 IMPLANT
SHEATH AVANTI 11CM 5FR (MISCELLANEOUS) ×4 IMPLANT
SPONGE GAUZE 4X4 12PLY (GAUZE/BANDAGES/DRESSINGS) ×8 IMPLANT
SPONGE LAP 4X18 X RAY DECT (DISPOSABLE) ×4 IMPLANT
STOPCOCK 4 WAY LG BORE MALE ST (IV SETS) ×4 IMPLANT
SUCKER INTRACARDIAC WEIGHTED (SUCKER) ×4 IMPLANT
SUT BONE WAX W31G (SUTURE) ×4 IMPLANT
SUT ETHIBOND (SUTURE) ×8 IMPLANT
SUT ETHIBOND 2 0 SH (SUTURE) ×18 IMPLANT
SUT ETHIBOND 2 0 SH 36X2 (SUTURE) ×10 IMPLANT
SUT ETHIBOND 2-0 RB-1 WHT (SUTURE) ×8 IMPLANT
SUT ETHIBOND 5 LR DA (SUTURE) ×8 IMPLANT
SUT ETHIBOND NAB MH 2-0 36IN (SUTURE) ×68 IMPLANT
SUT ETHILON 3 0 FSL (SUTURE) ×4 IMPLANT
SUT PDS AB 1 CTX 36 (SUTURE) ×12 IMPLANT
SUT PDS AB 3-0 SH 27 (SUTURE) IMPLANT
SUT PROLENE 3 0 SH DA (SUTURE) ×8 IMPLANT
SUT PROLENE 4 0 RB 1 (SUTURE) ×22
SUT PROLENE 4 0 SH DA (SUTURE) ×20 IMPLANT
SUT PROLENE 4-0 RB1 .5 CRCL 36 (SUTURE) ×22 IMPLANT
SUT PROLENE 6 0 C 1 30 (SUTURE) ×36 IMPLANT
SUT SILK  1 MH (SUTURE) ×10
SUT SILK 1 MH (SUTURE) ×10 IMPLANT
SUT SILK 1 TIES 10X30 (SUTURE) ×4 IMPLANT
SUT SILK 2 0 SH CR/8 (SUTURE) ×12 IMPLANT
SUT SILK 2 0SH CR/8 30 (SUTURE) ×4 IMPLANT
SUT SILK 3 0 SH CR/8 (SUTURE) ×4 IMPLANT
SUT STEEL 6MS V (SUTURE) ×8 IMPLANT
SUT STEEL SZ 6 DBL 3X14 BALL (SUTURE) ×4 IMPLANT
SUT TEM PAC WIRE 2 0 SH (SUTURE) ×8 IMPLANT
SUT VIC AB 1 CTX 18 (SUTURE) ×8 IMPLANT
SUT VIC AB 1 CTX 36 (SUTURE) ×8
SUT VIC AB 1 CTX36XBRD ANBCTR (SUTURE) ×8 IMPLANT
SUT VIC AB 2-0 CTX 27 (SUTURE) ×8 IMPLANT
SUT VIC AB 3-0 SH 8-18 (SUTURE) ×4 IMPLANT
SUT VIC AB 3-0 X1 27 (SUTURE) ×16 IMPLANT
SUT VICRYL 2 TP 1 (SUTURE) ×4 IMPLANT
SYR 50ML LL SCALE MARK (SYRINGE) ×4 IMPLANT
SYSTEM ANNULOPLASTY MC3 (Orthopedic Implant) ×4 IMPLANT
SYSTEM SAHARA CHEST DRAIN ATS (WOUND CARE) ×4 IMPLANT
TAPE CLOTH SURG 4X10 WHT LF (GAUZE/BANDAGES/DRESSINGS) ×8 IMPLANT
TAPE STRIPS DRAPE STRL (GAUZE/BANDAGES/DRESSINGS) ×4 IMPLANT
TOWEL OR 17X24 6PK STRL BLUE (TOWEL DISPOSABLE) ×8 IMPLANT
TOWEL OR 17X26 10 PK STRL BLUE (TOWEL DISPOSABLE) ×8 IMPLANT
TRAY CATH LUMEN 1 20CM STRL (SET/KITS/TRAYS/PACK) ×4 IMPLANT
TRAY FOLEY IC TEMP SENS 14FR (CATHETERS) ×4 IMPLANT
TUBE SUCT INTRACARD DLP 20F (MISCELLANEOUS) ×4 IMPLANT
TUBING ART PRESS 48 MALE/FEM (TUBING) ×8 IMPLANT
UNDERPAD 30X30 INCONTINENT (UNDERPADS AND DIAPERS) ×4 IMPLANT
VENT LEFT HEART 12002 (CATHETERS)
WATER STERILE IRR 1000ML POUR (IV SOLUTION) ×8 IMPLANT
YANKAUER SUCT BULB TIP NO VENT (SUCTIONS) ×8 IMPLANT

## 2012-12-18 NOTE — Anesthesia Postprocedure Evaluation (Signed)
  Anesthesia Post-op Note  Patient: Kaitlin [name]  Procedure(s) Performed: Procedure(s): INSERTION OF IMPLANTABLE RIGHT VENTRICULAR ASSIST DEVICE (N/A)  Patient Location: SICU  Anesthesia Type:General  Level of Consciousness: sedated and Patient remains intubated per anesthesia plan  Airway and Oxygen Therapy: Patient remains intubated per anesthesia plan and Patient placed on Ventilator (see vital sign flow sheet for setting)  Post-op Pain: none  Post-op Assessment: Post-op Vital signs reviewed, Patient's Cardiovascular Status Stable, Respiratory Function Stable, Patent Airway, No signs of Nausea or vomiting and Pain level controlled  Post-op Vital Signs: stable  Complications: No apparent anesthesia complications

## 2012-12-18 NOTE — Brief Op Note (Signed)
12/08/2012 - 12/18/2012  4:04 PM  PATIENT:  Kaitlin [name]  67 y.o. female  PRE-OPERATIVE DIAGNOSIS:  Class 4 HF, idiopathic CM  POST-OPERATIVE DIAGNOSIS:  same  PROCEDURE:  Procedure(s): INSERTION OF IMPLANTABLE LEFT VENTRICULAR ASSIST DEVICE (N/A) INTRAOPERATIVE TRANSESOPHAGEAL ECHOCARDIOGRAM (N/A) TRICUSPID VALVE REPAIR (N/A)  SURGEON:  Surgeon(s) and Role:    * Kerin Perna, MD - Primary    * Alleen Borne, MD - Assisting  PHYSICIAN ASSISTANT: 0  ASSISTANTS: Harrell Gave  RNFA   ANESTHESIA:   general  EBL:  Total I/O In: 5263 [I.V.:2500; Blood:2263; IV Piggyback:500] Out: 4300 [Urine:3100; Blood:1200]  BLOOD ADMINISTERED:one unit PRBC's CC PRBC  DRAINS: 2 pleural, 2 MT  LOCAL MEDICATIONS USED:  NONE  SPECIMEN:  Source of Specimen:  LV apex  DISPOSITION OF SPECIMEN:  PATHOLOGY  COUNTS:  YES  TOURNIQUET:  * No tourniquets in log *  DICTATION: .Dragon Dictation  PLAN OF CARE: Admit to inpatient   PATIENT DISPOSITION:  ICU - intubated and critically ill.   Delay start of Pharmacological VTE agent (>24hrs) due to surgical blood loss or risk of bleeding: yes

## 2012-12-18 NOTE — Transfer of Care (Signed)
Immediate Anesthesia Transfer of Care Note  Patient: Kaitlin [name]  Procedure(s) Performed: Procedure(s): INSERTION OF IMPLANTABLE LEFT VENTRICULAR ASSIST DEVICE (N/A) INTRAOPERATIVE TRANSESOPHAGEAL ECHOCARDIOGRAM (N/A) TRICUSPID VALVE REPAIR (N/A)  Patient Location: PACU  Anesthesia Type:General  Level of Consciousness: unresponsive and Patient remains intubated per anesthesia plan  Airway & Oxygen Therapy: Patient remains intubated per anesthesia plan and Patient placed on Ventilator (see vital sign flow sheet for setting)  Post-op Assessment: Report given to PACU RN and Post -op Vital signs reviewed and stable  Post vital signs: Reviewed  Complications: No apparent anesthesia complications

## 2012-12-18 NOTE — Progress Notes (Signed)
ANTIBIOTIC CONSULT NOTE   Pharmacy Consult for vancomycin Indication: surgical prophylaxis  No Known Allergies  Patient Measurements: Height: 5\' 3"  (160 cm) Weight: 165 lb 2 oz (74.9 kg) IBW/kg (Calculated) : 52.4   Vital Signs: Pulse Rate: 98 (08/18 0738) Intake/Output from previous day: 08/17 0701 - 08/18 0700 In: 1070.4 [P.O.:720; I.V.:350.4] Out: 2500 [Urine:2500] Intake/Output from this shift: Total I/O In: 5263 [I.V.:2500; Blood:2263; IV Piggyback:500] Out: 4300 [Urine:3100; Blood:1200]  Labs:  Recent Labs  12/17/12 0500  12/17/12 2135 12/18/12 0427  12/18/12 1149 12/18/12 1200 12/18/12 1337  WBC  --   --  9.0 9.3  --   --   --   --   HGB  --   < > 10.9* 11.4*  < > 6.8* 8.3* 6.5*  PLT  --   --  209 220  --   --  98*  --   CREATININE 1.67*  --  1.56* 1.59*  --   --   --   --   < > = values in this interval not displayed. Estimated Creatinine Clearance: 34.2 ml/min (by C-G formula based on Cr of 1.59). No results found for this basename: VANCOTROUGH, Leodis Binet, VANCORANDOM, GENTTROUGH, GENTPEAK, GENTRANDOM, TOBRATROUGH, TOBRAPEAK, TOBRARND, AMIKACINPEAK, AMIKACINTROU, AMIKACIN,  in the last 72 hours   Microbiology: Recent Results (from the past 720 hour(s))  MRSA PCR SCREENING     Status: None   Collection Time    12/08/12  3:37 PM      Result Value Range Status   MRSA by PCR NEGATIVE  NEGATIVE Final   Comment:            The GeneXpert MRSA Assay (FDA     approved for NASAL specimens     only), is one component of a     comprehensive MRSA colonization     surveillance program. It is not     intended to diagnose MRSA     infection nor to guide or     monitor treatment for     MRSA infections.  SURGICAL PCR SCREEN     Status: Abnormal   Collection Time    12/13/12  6:10 PM      Result Value Range Status   MRSA, PCR NEGATIVE  NEGATIVE Final   Staphylococcus aureus POSITIVE (*) NEGATIVE Final   Comment:            The Xpert SA Assay (FDA     approved  for NASAL specimens     in patients over 59 years of age),     is one component of     a comprehensive surveillance     program.  Test performance has     been validated by [employer] for patients greater     than or equal to 46 year old.     It is not intended     to diagnose infection nor to     guide or monitor treatment.    Medical History: Past Medical History  Diagnosis Date  . Paroxysmal supraventricular tachycardia   . Hypokalemia   . Chronic systolic heart failure     a. 12/6576 Echo: EF 15%, mod to sev antlat and inflat HK, inf AK, mild to mod MR, mildly/mod reduced RV fxn, Mod TR, PASP .  [name] History of DVT (deep vein thrombosis)   . Dyslipidemia   . HTN (hypertension)   . Nonischemic cardiomyopathy     a. 06/2012 Echo:  EF 15%;  b. 06/2012 Cath: nl except luminal irregs in LAD.  [name] Ventricular tachycardia     a. s/p ICD  . Goiter   . Implantable cardiac defibrillator- Biotronik     Single chamber  . [name] (left bundle branch block)     intermittent  . Palliative care patient     Assessment: 67 year old female with Class IV heart failure now s/p LVAD and TV repair. Orders for pharmacy to assist with vancomycin dosing post-op for surgical prophylaxis. Patient does have some renal dysfunction so will adjust dosing to daily x2 doses to provide 48 hours of coverage.   Goal of Therapy:  Vancomycin trough level 10-15 mcg/ml  Plan:  Vancomycin 1g IV q 24 hours x2 doses to provide 48 hours of coverage Pharmacy to continue to follow with Heart Failure Team  Will adjust dosing if renal function worsens  Sheppard Coil PharmD., BCPS Clinical Pharmacist Pager [phone] 12/18/2012 4:22 PM

## 2012-12-18 NOTE — Progress Notes (Addendum)
ANTIBIOTIC CONSULT NOTE - INITIAL  Pharmacy Consult for vancomycin  Indication: post op x5 days  No Known Allergies  Patient Measurements: Height: 5' 2.99" (160 cm) Weight: 165 lb 5.5 oz (75 kg) IBW/kg (Calculated) : 52.38 Adjusted Body Weight:   Vital Signs: Temp: 98.6 F (37 C) (08/18 1845) Temp src: Core (Comment) (08/18 1600) BP: 95/76 mmHg (08/18 1700) Pulse Rate: 88 (08/18 2250) Intake/Output from previous day: 08/17 0701 - 08/18 0700 In: 1070.4 [P.O.:720; I.V.:350.4] Out: 2500 [Urine:2500] Intake/Output from this shift: Total I/O In: 2352 [I.V.:1100; Blood:1002; IV Piggyback:250] Out: 1075 [Urine:595; Drains:50; Chest Tube:430]  Labs:  Recent Labs  12/17/12 2135 12/18/12 0427  12/18/12 1200  12/18/12 1612 12/18/12 1613  12/18/12 1627  12/18/12 2049 12/18/12 2134 12/18/12 2145 12/18/12 2250  WBC 9.0 9.3  --   --   --   --   --   --   --   --   --   --  12.3*  --   HGB 10.9* 11.4*  < > 8.3*  < >  --   --   < > 10.2*  < > 9.2* 9.2* 8.7*  --   PLT 209 220  --  98*  --   --  130*  --   --   --   --   --  154  --   CREATININE 1.56* 1.59*  --   --   --  1.26*  --   --  1.30*  --   --   --   --  1.20*  < > = values in this interval not displayed. Estimated Creatinine Clearance: 45.3 ml/min (by C-G formula based on Cr of 1.2). No results found for this basename: VANCOTROUGH, Leodis Binet, VANCORANDOM, GENTTROUGH, GENTPEAK, GENTRANDOM, TOBRATROUGH, TOBRAPEAK, TOBRARND, AMIKACINPEAK, AMIKACINTROU, AMIKACIN,  in the last 72 hours   Microbiology: Recent Results (from the past 720 hour(s))  MRSA PCR SCREENING     Status: None   Collection Time    12/08/12  3:37 PM      Result Value Range Status   MRSA by PCR NEGATIVE  NEGATIVE Final   Comment:            The GeneXpert MRSA Assay (FDA     approved for NASAL specimens     only), is one component of a     comprehensive MRSA colonization     surveillance program. It is not     intended to diagnose MRSA     infection  nor to guide or     monitor treatment for     MRSA infections.  SURGICAL PCR SCREEN     Status: Abnormal   Collection Time    12/13/12  6:10 PM      Result Value Range Status   MRSA, PCR NEGATIVE  NEGATIVE Final   Staphylococcus aureus POSITIVE (*) NEGATIVE Final   Comment:            The Xpert SA Assay (FDA     approved for NASAL specimens     in patients over 75 years of age),     is one component of     a comprehensive surveillance     program.  Test performance has     been validated by [employer] for patients greater     than or equal to 75 year old.     It is not intended     to diagnose infection nor  to     guide or monitor treatment.    Medical History: Past Medical History  Diagnosis Date  . Paroxysmal supraventricular tachycardia   . Hypokalemia   . Chronic systolic heart failure     a. 09/6211 Echo: EF 15%, mod to sev antlat and inflat HK, inf AK, mild to mod MR, mildly/mod reduced RV fxn, Mod TR, PASP .  [name] History of DVT (deep vein thrombosis)   . Dyslipidemia   . HTN (hypertension)   . Nonischemic cardiomyopathy     a. 06/2012 Echo: EF 15%;  b. 06/2012 Cath: nl except luminal irregs in LAD.  [name] Ventricular tachycardia     a. s/p ICD  . Goiter   . Implantable cardiac defibrillator- Biotronik     Single chamber  . [name] (left bundle branch block)     intermittent  . Palliative care patient     Medications:   Assessment and Plan Pt post op for RVAD as an addition to LVAD.  vanc to continue for 5 days. Current dose of 1gm q24 hours is acceptable dose and interval. Rescheduled next dose for 1800 8/19 as patient is covered with pre-op dose of 1250mg  for 24 hours with current renal clearance.   Goal of Therapy:  Vancomycin trough level 15-20 mcg/ml  Janice Coffin 12/18/2012,11:49 PM

## 2012-12-18 NOTE — Anesthesia Postprocedure Evaluation (Signed)
  Anesthesia Post-op Note  Patient: Kaitlin [name]  Procedure(s) Performed: Procedure(s): INSERTION OF IMPLANTABLE LEFT VENTRICULAR ASSIST DEVICE (N/A) INTRAOPERATIVE TRANSESOPHAGEAL ECHOCARDIOGRAM (N/A) TRICUSPID VALVE REPAIR (N/A)  Patient Location: SICU  Anesthesia Type:General  Level of Consciousness: sedated and Patient remains intubated per anesthesia plan  Airway and Oxygen Therapy: Patient remains intubated per anesthesia plan and Patient placed on Ventilator (see vital sign flow sheet for setting)  Post-op Pain: none  Post-op Assessment: Post-op Vital signs reviewed and Patient's Cardiovascular Status Stable  Post-op Vital Signs: stable  Complications: No apparent anesthesia complications

## 2012-12-18 NOTE — OR Nursing (Signed)
14:00 - 1st call to SICU,  15:05 - 2nd call to SICU.

## 2012-12-18 NOTE — Transfer of Care (Signed)
Immediate Anesthesia Transfer of Care Note  Patient: Kaitlin [name]  Procedure(s) Performed: Procedure(s): INSERTION OF IMPLANTABLE RIGHT VENTRICULAR ASSIST DEVICE (N/A)  Patient Location: SICU  Anesthesia Type:General  Level of Consciousness: sedated and Patient remains intubated per anesthesia plan  Airway & Oxygen Therapy: Patient remains intubated per anesthesia plan and Patient placed on Ventilator (see vital sign flow sheet for setting)  Post-op Assessment: Post -op Vital signs reviewed and stable  Post vital signs: stable  Complications: No apparent anesthesia complications

## 2012-12-18 NOTE — Progress Notes (Addendum)
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 EF 15-25% with mild to moderate RV dysfunction  Underwent HMII LVAD placement TV repair this afternoon without complication.  Now back in ICU. Currently on high dose pressors: EPI @ 10, Dobut @ 4, Levophed @ 6 , NEO @ 15. On NO at 35 ppm.   MAP ~70. CVP 22-23  Co-ox 31%.   VAD 8000 with continuous low-flow alarms. (interrogated personally)  U/O poor.  I did bedside echo. No effusion RV severely HK     Current Facility-Administered Medications  Medication Dose Route Frequency Provider Last Rate Last Dose  . 0.45 % sodium chloride infusion   Intravenous Continuous Kerin Perna, MD 20 mL/hr at 12/18/12 1700    . 0.9 %  sodium chloride infusion  250 mL Intravenous PRN Dolores Patty, MD   10 mL at 12/16/12 0900  . 0.9 %  sodium chloride infusion   Intravenous Continuous Dolores Patty, MD 10 mL/hr at 12/15/12 1900    . 0.9 %  sodium chloride infusion   Intravenous Continuous Kerin Perna, MD 10 mL/hr at 12/18/12 1700    . [START ON 12/19/2012] 0.9 %  sodium chloride infusion  250 mL Intravenous Continuous Kerin Perna, MD      . Melene Muller ON 12/19/2012] acetaminophen (TYLENOL) tablet 1,000 mg  1,000 mg Oral Q6H Kerin Perna, MD       Or  . Melene Muller ON 12/19/2012] acetaminophen (TYLENOL) solution 1,000 mg  1,000 mg Per Tube Q6H Kerin Perna, MD      . acetaminophen (TYLENOL) solution 650 mg  650 mg Per Tube Once Kerin Perna, MD       Or  . acetaminophen (TYLENOL) suppository 650 mg  650 mg Rectal Once Kerin Perna, MD      . albumin human 5 % solution 250 mL  250 mL Intravenous Q15 min PRN Kerin Perna, MD      . amiodarone (NEXTERONE PREMIX) 360 mg/200 mL dextrose IV infusion  30 mg/hr Intravenous Continuous Kerin Perna, MD 16.7 mL/hr at 12/18/12 1800 30 mg/hr at 12/18/12 1800  . [START ON 12/19/2012] aspirin EC tablet 325 mg   325 mg Oral Daily Kerin Perna, MD       Or  . Melene Muller ON 12/19/2012] aspirin chewable tablet 324 mg  324 mg Per Tube Daily Kerin Perna, MD       Or  . Melene Muller ON 12/19/2012] aspirin suppository 300 mg  300 mg Rectal Daily Kerin Perna, MD      . aspirin chewable tablet 81 mg  81 mg Oral Daily [name] D [name], NP   81 mg at 12/17/12 0939  . atorvastatin (LIPITOR) tablet 20 mg  20 mg Oral q1800 Dolores Patty, MD   20 mg at 12/17/12 1726  . [START ON 12/19/2012] bisacodyl (DULCOLAX) EC tablet 10 mg  10 mg Oral Daily Kerin Perna, MD       Or  . Melene Muller ON 12/19/2012] bisacodyl (DULCOLAX) suppository 10 mg  10 mg Rectal Daily Kerin Perna, MD      . cefUROXime (ZINACEF) 1.5 g in dextrose 5 % 50 mL IVPB  1.5 g Intravenous Q12H Kerin Perna, MD      . dexmedetomidine (PRECEDEX) 200 MCG/50ML infusion  0.1-0.7 mcg/kg/hr Intravenous Continuous Kathlee Nations Trigt,  MD 13.1 mL/hr at 12/18/12 1800 0.7 mcg/kg/hr at 12/18/12 1800  . dexmedetomidine (PRECEDEX) 400 MCG/100ML infusion  0.4-1.2 mcg/kg/hr Intravenous To OR Kerin Perna, MD      . digoxin Margit Banda) tablet 0.125 mg  0.125 mg Oral Daily Dolores Patty, MD   0.125 mg at 12/17/12 0940  . diphenhydrAMINE (BENADRYL) capsule 25 mg  25 mg Oral QHS PRN Dolores Patty, MD   25 mg at 12/14/12 2342  . DOBUTamine (DOBUTREX) infusion 4000 mcg/mL  4 mcg/kg/min Intravenous Titrated Dolores Patty, MD 4.5 mL/hr at 12/18/12 1800 3.932 mcg/kg/min at 12/18/12 1800  . [START ON 12/19/2012] docusate sodium (COLACE) capsule 200 mg  200 mg Oral Daily Kerin Perna, MD      . DOPamine (INTROPIN) 800 mg in dextrose 5 % 250 mL infusion  3-10 mcg/kg/min Intravenous Continuous Kerin Perna, MD 4.2 mL/hr at 12/18/12 1800 3 mcg/kg/min at 12/18/12 1800  . EPINEPHrine (ADRENALIN) 4,000 mcg in dextrose 5 % 250 mL infusion  5-8 mcg/min Intravenous Continuous Kerin Perna, MD 37.5 mL/hr at 12/18/12 1800 10 mcg/min at 12/18/12 1800  . famotidine (PEPCID)  IVPB 20 mg  20 mg Intravenous Q12H Kerin Perna, MD   20 mg at 12/18/12 1600  . [START ON 12/19/2012] fluconazole (DIFLUCAN) IVPB 400 mg  400 mg Intravenous Once Kerin Perna, MD      . insulin regular (NOVOLIN R,HUMULIN R) 1 Units/mL in sodium chloride 0.9 % 100 mL infusion   Intravenous Continuous Kerin Perna, MD 4.3 mL/hr at 12/18/12 1700 4.3 Units/hr at 12/18/12 1700  . insulin regular bolus via infusion 0-10 Units  0-10 Units Intravenous TID WC Kerin Perna, MD      . lactated ringers infusion   Intravenous Continuous Kerin Perna, MD 20 mL/hr at 12/18/12 1700    . magnesium sulfate IVPB 4 g 100 mL  4 g Intravenous Once Kerin Perna, MD   4 g at 12/18/12 1630  . midazolam (VERSED) injection 2 mg  2 mg Intravenous Q1H PRN Kerin Perna, MD   2 mg at 12/18/12 1700  . morphine 2 MG/ML injection 1-4 mg  1-4 mg Intravenous Q1H PRN Kerin Perna, MD   2 mg at 12/18/12 1630  . morphine 2 MG/ML injection 2-5 mg  2-5 mg Intravenous Q1H PRN Kerin Perna, MD      . mupirocin cream (BACTROBAN) 2 %   Topical BID Kathlee Nations Trigt, MD      . nitroGLYCERIN 0.2 mg/mL in dextrose 5 % infusion  0-100 mcg/min Intravenous Continuous Kerin Perna, MD      . norepinephrine (LEVOPHED) 8 mg in dextrose 5 % 250 mL infusion  5-10 mcg/min Intravenous Continuous Kerin Perna, MD 11.3 mL/hr at 12/18/12 1705 6 mcg/min at 12/18/12 1705  . ondansetron (ZOFRAN) injection 4 mg  4 mg Intravenous Q6H PRN Dolores Patty, MD      . ondansetron [facility]) injection 4 mg  4 mg Intravenous Q6H PRN Kerin Perna, MD      . oxyCODONE (Oxy IR/ROXICODONE) immediate release tablet 5-10 mg  5-10 mg Oral Q3H PRN Kerin Perna, MD      . Melene Muller ON 12/20/2012] pantoprazole (PROTONIX) EC tablet 40 mg  40 mg Oral Daily Kerin Perna, MD      . phenylephrine (NEO-SYNEPHRINE) 20,000 mcg in dextrose 5 % 250 mL infusion  0-100 mcg/min Intravenous Titrated Kerin Perna, MD  11.3 mL/hr at 12/18/12 1700 15 mcg/min  at 12/18/12 1700  . polyethylene glycol (MIRALAX / GLYCOLAX) packet 17 g  17 g Oral Daily Dolores Patty, MD   17 g at 12/17/12 0939  . potassium chloride (K-DUR) CR tablet 20 mEq  20 mEq Oral Daily [name] D [name], NP   20 mEq at 12/17/12 0940  . potassium chloride 10 mEq in 50 mL *CENTRAL LINE* IVPB  10 mEq Intravenous Q1 Hr x 3 Kerin Perna, MD   10 mEq at 12/18/12 1800  . [START ON 12/19/2012] rifampin (RIFADIN) capsule 600 mg  600 mg Oral Once Kerin Perna, MD      . sodium bicarbonate injection 50 mEq  50 mEq Intravenous Once Kathlee Nations Trigt, MD      . sodium chloride 0.9 % injection 10-40 mL  10-40 mL Intracatheter PRN Dolores Patty, MD   10 mL at 12/17/12 0940  . sodium chloride 0.9 % injection 3 mL  3 mL Intravenous Q12H Dolores Patty, MD   3 mL at 12/17/12 2219  . sodium chloride 0.9 % injection 3 mL  3 mL Intravenous PRN Dolores Patty, MD      . Melene Muller ON 12/19/2012] sodium chloride 0.9 % injection 3 mL  3 mL Intravenous Q12H Kerin Perna, MD      . Melene Muller ON 12/19/2012] sodium chloride 0.9 % injection 3 mL  3 mL Intravenous PRN Kerin Perna, MD      . vancomycin (VANCOCIN) IVPB 1000 mg/200 mL premix  1,000 mg Intravenous Q24H Severiano Gilbert, [facility]        PHYSICAL EXAM: Filed Vitals:   12/18/12 1715 12/18/12 1730 12/18/12 1745 12/18/12 1800  BP:      Pulse:      Temp: 97.7 F (36.5 C) 97.9 F (36.6 C) 98.1 F (36.7 C) 98.2 F (36.8 C)  TempSrc:      Resp: 25 25 24 23   Height:      Weight:      SpO2:        General: intubated sedated HEENT: normal Neck: supple. JVP to jaw + swan Cor: Dressing intact. Distant HS. LVAD hum Lungs: mild rhonchi anteriorl Abdomen: soft, nontender, mildly distended. Driveline site ok Extremities: no cyanosis, clubbing, rash, 1-2+ edema Neuro: intubated sedated   ASSESSMENT:  1. Acute on chronic systolic HF with biventricular HF EF 10%      S/p HM II VAD with TV repair 12/18/12 2. RV failure 3. VT s/p Biotronik  ICD 4. Chronic renal failure  5. [name] 6. Severe TR s/p TV repair   PLAN/DISCUSSION:   She has persistent cardiogenic shock despite VAD support and high dose pressors d/t RV failure. I have discussed with Dr. Donata Clay. Will need to go back to OR for RV support with Centrimag.   The patient is critically ill with multiple organ systems failure and requires high complexity decision making for assessment and support, frequent evaluation and titration of therapies, application of advanced monitoring technologies and extensive interpretation of multiple databases.   Critical Care Time devoted to patient care services described in this note is 45 Minutes.  [name] 6:16 PM

## 2012-12-18 NOTE — Progress Notes (Signed)
TCTS  Cardiac index and pump flows and Co_ox are down Echo at bedside indicates poor RV function Will return to OR  To open chest and possible RVAD

## 2012-12-18 NOTE — Progress Notes (Signed)
Transported via stretcher with monitor & nurse in attendance to preop holding area.  No complaints voiced. Voided prior to transport.

## 2012-12-18 NOTE — Progress Notes (Signed)
  Echocardiogram Echocardiogram Transesophageal has been performed.  Kaitlin [name], [facility] 12/18/2012, 8:59 AM

## 2012-12-18 NOTE — Progress Notes (Signed)
The patient was examined and preop studies reviewed. There has been no change from the prior exam and the patient is ready for surgery.plan LVAD implantation and TV repair today on [employer]

## 2012-12-18 NOTE — Anesthesia Preprocedure Evaluation (Signed)
Anesthesia Evaluation   Patient unresponsive    Reviewed: Allergy & Precautions, H&P , NPO status , Patient's Chart, lab work & pertinent test results, Unable to perform ROS - Chart review only  Airway       Dental   Pulmonary          Cardiovascular hypertension, +CHF + dysrhythmias     Neuro/Psych    GI/Hepatic (+) Hepatitis -  Endo/Other    Renal/GU Renal disease     Musculoskeletal   Abdominal   Peds  Hematology   Anesthesia Other Findings LVAD placed today  Reproductive/Obstetrics                           Anesthesia Physical Anesthesia Plan  ASA: V  Anesthesia Plan: General   Post-op Pain Management:    Induction: Intravenous  Airway Management Planned:   Additional Equipment: TEE  Intra-op Plan:   Post-operative Plan: Post-operative intubation/ventilation  Informed Consent:   Plan Discussed with: CRNA, Anesthesiologist and Surgeon  Anesthesia Plan Comments:         Anesthesia Quick Evaluation

## 2012-12-18 NOTE — Addendum Note (Signed)
Addendum created 12/18/12 2234 by Rivka Barbara, MD   Modules edited: Anesthesia Events

## 2012-12-18 NOTE — Progress Notes (Signed)
Nurse said pt is to have LVAD surgery tomorrow morning and is requesting prayer. We visited briefly and then had prayer together. Pt was grateful for chaplain visit.

## 2012-12-18 NOTE — Addendum Note (Signed)
Addendum created 12/18/12 2255 by Melina Schools, CRNA   Modules edited: Anesthesia Events, Anesthesia Flowsheet

## 2012-12-18 NOTE — Anesthesia Preprocedure Evaluation (Addendum)
Anesthesia Evaluation  Patient identified by MRN, date of birth, ID band Patient awake    Reviewed: Allergy & Precautions, H&P , NPO status , Patient's Chart, lab work & pertinent test results, reviewed documented beta blocker date and time   History of Anesthesia Complications Negative for: history of anesthetic complications  Airway Mallampati: II TM Distance: >3 FB Neck ROM: Full    Dental  (+) Edentulous Upper and Edentulous Lower   Pulmonary  breath sounds clear to auscultation        Cardiovascular hypertension, Pt. on medications and Pt. on home beta blockers +CHF (EF 15-20%, on Dobutamine 48mcg/kg/min) + dysrhythmias (s/p Biotronik AICD placement 2009, s/p SVT ablation 07/15/12 Dr. Ladona Ridgel) Atrial Fibrillation and Ventricular Tachycardia + Valvular Problems/Murmurs (severe TR, moderate RV dysfunction) Rhythm:Regular Rate:Normal  History of DVT   Neuro/Psych    GI/Hepatic   Endo/Other    Renal/GU Renal InsufficiencyRenal diseaseCreatinine 1.67     Musculoskeletal   Abdominal   Peds  Hematology   Anesthesia Other Findings   Reproductive/Obstetrics                          Anesthesia Physical Anesthesia Plan  ASA: IV  Anesthesia Plan: General   Post-op Pain Management:    Induction: Intravenous  Airway Management Planned: Oral ETT  Additional Equipment: Arterial line, CVP, PA Cath, 3D TEE and Ultrasound Guidance Line Placement  Intra-op Plan:   Post-operative Plan: Post-operative intubation/ventilation  Informed Consent: I have reviewed the patients History and Physical, chart, labs and discussed the procedure including the risks, benefits and alternatives for the proposed anesthesia with the patient or authorized representative who has indicated his/her understanding and acceptance.   Dental advisory given  Plan Discussed with: Anesthesiologist and Surgeon  Anesthesia Plan  Comments: (Non-ischemic cardiomyopathy:  EF 20% by Echo 12/10/12  H/O VT with AICD Renal insufficiency Cr 1.67 Htn  Plan GA)       Anesthesia Quick Evaluation

## 2012-12-19 ENCOUNTER — Encounter (HOSPITAL_COMMUNITY): Payer: Self-pay | Admitting: Cardiothoracic Surgery

## 2012-12-19 ENCOUNTER — Inpatient Hospital Stay (HOSPITAL_COMMUNITY): Payer: Commercial Managed Care - PPO

## 2012-12-19 ENCOUNTER — Ambulatory Visit (HOSPITAL_COMMUNITY): Payer: Commercial Managed Care - PPO

## 2012-12-19 DIAGNOSIS — Z95818 Presence of other cardiac implants and grafts: Secondary | ICD-10-CM

## 2012-12-19 DIAGNOSIS — I5023 Acute on chronic systolic (congestive) heart failure: Secondary | ICD-10-CM

## 2012-12-19 DIAGNOSIS — R570 Cardiogenic shock: Secondary | ICD-10-CM

## 2012-12-19 DIAGNOSIS — I428 Other cardiomyopathies: Secondary | ICD-10-CM

## 2012-12-19 DIAGNOSIS — I509 Heart failure, unspecified: Secondary | ICD-10-CM

## 2012-12-19 LAB — CBC
HCT: 15 % — ABNORMAL LOW (ref 36.0–46.0)
HCT: 23.6 % — ABNORMAL LOW (ref 36.0–46.0)
HCT: 23.2 % — ABNORMAL LOW (ref 36.0–46.0)
Hemoglobin: 5.3 g/dL — CL (ref 12.0–15.0)
Hemoglobin: 8.3 g/dL — ABNORMAL LOW (ref 12.0–15.0)
Hemoglobin: 8.2 g/dL — ABNORMAL LOW (ref 12.0–15.0)
MCH: 29.9 pg (ref 26.0–34.0)
MCH: 29 pg (ref 26.0–34.0)
MCH: 28.7 pg (ref 26.0–34.0)
MCHC: 35.3 g/dL (ref 30.0–36.0)
MCHC: 35.2 g/dL (ref 30.0–36.0)
MCHC: 35.3 g/dL (ref 30.0–36.0)
MCV: 84.5 fL (ref 78.0–100.0)
MCV: 84.7 fL (ref 78.0–100.0)
MCV: 81.1 fL (ref 78.0–100.0)
Platelets: 175 10*3/uL (ref 150–400)
Platelets: 143 10*3/uL — ABNORMAL LOW (ref 150–400)
Platelets: 108 10*3/uL — ABNORMAL LOW (ref 150–400)
RBC: 2.06 MIL/uL — ABNORMAL LOW (ref 3.87–5.11)
RBC: 1.77 MIL/uL — ABNORMAL LOW (ref 3.87–5.11)
RBC: 2.86 MIL/uL — ABNORMAL LOW (ref 3.87–5.11)
RDW: 15.1 % (ref 11.5–15.5)
RDW: 15.2 % (ref 11.5–15.5)
RDW: 15.9 % — ABNORMAL HIGH (ref 11.5–15.5)
RDW: 16.2 % — ABNORMAL HIGH (ref 11.5–15.5)
WBC: 6 10*3/uL (ref 4.0–10.5)
WBC: 6.6 10*3/uL (ref 4.0–10.5)
WBC: 8.5 10*3/uL (ref 4.0–10.5)

## 2012-12-19 LAB — CARBOXYHEMOGLOBIN
Carboxyhemoglobin: 1.3 % (ref 0.5–1.5)
Carboxyhemoglobin: 1.5 % (ref 0.5–1.5)
Carboxyhemoglobin: 1.8 % — ABNORMAL HIGH (ref 0.5–1.5)
Carboxyhemoglobin: 1.5 % (ref 0.5–1.5)
Carboxyhemoglobin: 1.5 % (ref 0.5–1.5)
Methemoglobin: 2.3 % — ABNORMAL HIGH (ref 0.0–1.5)
Methemoglobin: 2.3 % — ABNORMAL HIGH (ref 0.0–1.5)
Methemoglobin: 2.4 % — ABNORMAL HIGH (ref 0.0–1.5)
Methemoglobin: 1.9 % — ABNORMAL HIGH (ref 0.0–1.5)
Methemoglobin: 1.3 % (ref 0.0–1.5)
Methemoglobin: 1.7 % — ABNORMAL HIGH (ref 0.0–1.5)
O2 Saturation: 59.7 %
O2 Saturation: 64 %
O2 Saturation: 69.4 %
O2 Saturation: 65.3 %
O2 Saturation: 66.6 %
Total hemoglobin: 5.8 g/dL — CL (ref 12.0–16.0)
Total hemoglobin: 5.4 g/dL — CL (ref 12.0–16.0)
Total hemoglobin: 8 g/dL — ABNORMAL LOW (ref 12.0–16.0)
Total hemoglobin: 7.6 g/dL — ABNORMAL LOW (ref 12.0–16.0)
Total hemoglobin: 7.9 g/dL — ABNORMAL LOW (ref 12.0–16.0)

## 2012-12-19 LAB — POCT I-STAT, CHEM 8
Creatinine, Ser: 1.5 mg/dL — ABNORMAL HIGH (ref 0.50–1.10)
Glucose, Bld: 137 mg/dL — ABNORMAL HIGH (ref 70–99)
HCT: 17 % — ABNORMAL LOW (ref 36.0–46.0)
Hemoglobin: 8.8 g/dL — ABNORMAL LOW (ref 12.0–15.0)
Hemoglobin: 5.8 g/dL — CL (ref 12.0–15.0)
Potassium: 4.6 mEq/L (ref 3.5–5.1)
Sodium: 142 mEq/L (ref 135–145)
Sodium: 142 mEq/L (ref 135–145)
TCO2: 23 mmol/L (ref 0–100)

## 2012-12-19 LAB — PREPARE FRESH FROZEN PLASMA: Unit division: 0

## 2012-12-19 LAB — DIC (DISSEMINATED INTRAVASCULAR COAGULATION)PANEL
D-Dimer, Quant: 0.62 ug/mL-FEU — ABNORMAL HIGH (ref 0.00–0.48)
Fibrinogen: 224 mg/dL (ref 204–475)
INR: 1.42 (ref 0.00–1.49)
Platelets: 139 10*3/uL — ABNORMAL LOW (ref 150–400)
Prothrombin Time: 17 seconds — ABNORMAL HIGH (ref 11.6–15.2)
Smear Review: NONE SEEN
aPTT: 36 seconds (ref 24–37)

## 2012-12-19 LAB — CBC WITH DIFFERENTIAL/PLATELET
Basophils Absolute: 0 10*3/uL (ref 0.0–0.1)
Basophils Relative: 0 % (ref 0–1)
Eosinophils Absolute: 0 10*3/uL (ref 0.0–0.7)
Eosinophils Relative: 1 % (ref 0–5)
HCT: 18.3 % — ABNORMAL LOW (ref 36.0–46.0)
Hemoglobin: 6.5 g/dL — CL (ref 12.0–15.0)
Lymphocytes Relative: 11 % — ABNORMAL LOW (ref 12–46)
Lymphs Abs: 0.8 10*3/uL (ref 0.7–4.0)
MCH: 30.4 pg (ref 26.0–34.0)
MCHC: 35.5 g/dL (ref 30.0–36.0)
MCV: 85.5 fL (ref 78.0–100.0)
Monocytes Absolute: 0.7 10*3/uL (ref 0.1–1.0)
Monocytes Relative: 10 % (ref 3–12)
Neutro Abs: 5.4 10*3/uL (ref 1.7–7.7)
Neutrophils Relative %: 78 % — ABNORMAL HIGH (ref 43–77)
Platelets: 103 10*3/uL — ABNORMAL LOW (ref 150–400)
RBC: 2.14 MIL/uL — ABNORMAL LOW (ref 3.87–5.11)
RDW: 15.4 % (ref 11.5–15.5)
WBC: 6.9 10*3/uL (ref 4.0–10.5)

## 2012-12-19 LAB — APTT
aPTT: 57 seconds — ABNORMAL HIGH (ref 24–37)
aPTT: 34 seconds (ref 24–37)

## 2012-12-19 LAB — PRO B NATRIURETIC PEPTIDE: Pro B Natriuretic peptide (BNP): 1470 pg/mL — ABNORMAL HIGH (ref 0–125)

## 2012-12-19 LAB — BASIC METABOLIC PANEL
BUN: 14 mg/dL (ref 6–23)
BUN: 13 mg/dL (ref 6–23)
CO2: 25 mEq/L (ref 19–32)
Calcium: 8.2 mg/dL — ABNORMAL LOW (ref 8.4–10.5)
Chloride: 104 mEq/L (ref 96–112)
Creatinine, Ser: 1.09 mg/dL (ref 0.50–1.10)
Creatinine, Ser: 1.12 mg/dL — ABNORMAL HIGH (ref 0.50–1.10)
GFR calc Af Amer: 60 mL/min — ABNORMAL LOW (ref >90)
GFR calc Af Amer: 58 mL/min — ABNORMAL LOW (ref >90)
GFR calc non Af Amer: 52 mL/min — ABNORMAL LOW (ref >90)
GFR calc non Af Amer: 50 mL/min — ABNORMAL LOW (ref >90)
Glucose, Bld: 103 mg/dL — ABNORMAL HIGH (ref 70–99)
Potassium: 4.4 mEq/L (ref 3.5–5.1)
Sodium: 139 mEq/L (ref 135–145)

## 2012-12-19 LAB — MAGNESIUM
Magnesium: 3.1 mg/dL — ABNORMAL HIGH (ref 1.5–2.5)
Magnesium: 2.4 mg/dL (ref 1.5–2.5)

## 2012-12-19 LAB — POCT I-STAT 3, ART BLOOD GAS (G3+)
Acid-Base Excess: 3 mmol/L — ABNORMAL HIGH (ref 0.0–2.0)
Acid-base deficit: 2 mmol/L (ref 0.0–2.0)
Bicarbonate: 27.6 mEq/L — ABNORMAL HIGH (ref 20.0–24.0)
O2 Saturation: 99 %
O2 Saturation: 99 %
O2 Saturation: 99 %
Patient temperature: 36.7
TCO2: 26 mmol/L (ref 0–100)
TCO2: 24 mmol/L (ref 0–100)
TCO2: 29 mmol/L (ref 0–100)
pCO2 arterial: 42.5 mmHg (ref 35.0–45.0)
pCO2 arterial: 46.3 mmHg — ABNORMAL HIGH (ref 35.0–45.0)
pH, Arterial: 7.352 (ref 7.350–7.450)
pO2, Arterial: 143 mmHg — ABNORMAL HIGH (ref 80.0–100.0)
pO2, Arterial: 210 mmHg — ABNORMAL HIGH (ref 80.0–100.0)
pO2, Arterial: 160 mmHg — ABNORMAL HIGH (ref 80.0–100.0)

## 2012-12-19 LAB — HEPARIN LEVEL (UNFRACTIONATED): Heparin Unfractionated: <0.1 IU/mL — ABNORMAL LOW (ref 0.30–0.70)

## 2012-12-19 LAB — CALCIUM, IONIZED
Calcium, Ion: 1.17 mmol/L (ref 1.13–1.30)
Calcium, Ion: 1.08 mmol/L — ABNORMAL LOW (ref 1.13–1.30)

## 2012-12-19 LAB — PROTIME-INR
INR: 1.61 — ABNORMAL HIGH (ref 0.00–1.49)
INR: 1.3 (ref 0.00–1.49)
Prothrombin Time: 18.7 seconds — ABNORMAL HIGH (ref 11.6–15.2)
Prothrombin Time: 15.9 seconds — ABNORMAL HIGH (ref 11.6–15.2)

## 2012-12-19 LAB — POCT I-STAT 4, (NA,K, GLUC, HGB,HCT)
Potassium: 2.9 mEq/L — ABNORMAL LOW (ref 3.5–5.1)
Sodium: 144 mEq/L (ref 135–145)

## 2012-12-19 LAB — PREPARE PLATELET PHERESIS: Unit division: 0

## 2012-12-19 LAB — PHOSPHORUS: Phosphorus: 3.2 mg/dL (ref 2.3–4.6)

## 2012-12-19 LAB — PREPARE RBC (CROSSMATCH)

## 2012-12-19 LAB — HEMOGLOBIN AND HEMATOCRIT, BLOOD
HCT: 16.1 % — ABNORMAL LOW (ref 36.0–46.0)
Hemoglobin: 5.9 g/dL — CL (ref 12.0–15.0)

## 2012-12-19 LAB — ALT: ALT: 17 U/L (ref 0–35)

## 2012-12-19 LAB — LACTATE DEHYDROGENASE: LDH: 337 U/L — ABNORMAL HIGH (ref 94–250)

## 2012-12-19 LAB — AST: AST: 85 U/L — ABNORMAL HIGH (ref 0–37)

## 2012-12-19 LAB — BILIRUBIN, TOTAL: Total Bilirubin: 3.1 mg/dL — ABNORMAL HIGH (ref 0.3–1.2)

## 2012-12-19 LAB — GLUCOSE, CAPILLARY
Glucose-Capillary: 133 mg/dL — ABNORMAL HIGH (ref 70–99)
Glucose-Capillary: 146 mg/dL — ABNORMAL HIGH (ref 70–99)

## 2012-12-19 MED ORDER — ALBUMIN HUMAN 5 % IV SOLN
INTRAVENOUS | Status: AC
  Administered 2012-12-19: 03:00:00
  Filled 2012-12-19: qty 500

## 2012-12-19 MED ORDER — BIOTENE DRY MOUTH MT LIQD
15.0000 mL | Freq: Four times a day (QID) | OROMUCOSAL | Status: DC
  Administered 2012-12-19 – 2013-01-06 (×71): 15 mL via OROMUCOSAL

## 2012-12-19 MED ORDER — SODIUM CHLORIDE 0.9 % IV SOLN
20.0000 ug | Freq: Once | INTRAVENOUS | Status: AC
  Administered 2012-12-19: 20 ug via INTRAVENOUS
  Filled 2012-12-19: qty 5

## 2012-12-19 MED ORDER — ALBUMIN HUMAN 5 % IV SOLN
12.5000 g | Freq: Once | INTRAVENOUS | Status: AC
  Administered 2012-12-19: 12.5 g via INTRAVENOUS

## 2012-12-19 MED ORDER — MIDAZOLAM HCL 2 MG/2ML IJ SOLN
INTRAMUSCULAR | Status: AC
  Administered 2012-12-19: 2 mg
  Filled 2012-12-19: qty 2

## 2012-12-19 MED ORDER — FUROSEMIDE 10 MG/ML IJ SOLN
INTRAMUSCULAR | Status: AC
  Filled 2012-12-19: qty 4

## 2012-12-19 MED ORDER — SODIUM CHLORIDE 0.9 % IV SOLN
1.0000 mg/h | INTRAVENOUS | Status: DC
  Administered 2012-12-19: 1 mg/h via INTRAVENOUS
  Administered 2012-12-20 (×2): 2 mg/h via INTRAVENOUS
  Administered 2012-12-21 – 2012-12-22 (×4): 4 mg/h via INTRAVENOUS
  Administered 2012-12-23: 2 mg/h via INTRAVENOUS
  Administered 2012-12-23 (×2): 4 mg/h via INTRAVENOUS
  Administered 2012-12-24: 8 mg/h via INTRAVENOUS
  Administered 2012-12-25 – 2012-12-30 (×8): 4 mg/h via INTRAVENOUS
  Administered 2012-12-30: 3 mg/h via INTRAVENOUS
  Administered 2012-12-31: 4 mg/h via INTRAVENOUS
  Filled 2012-12-19 (×25): qty 10

## 2012-12-19 MED ORDER — FENTANYL BOLUS VIA INFUSION
25.0000 ug | Freq: Four times a day (QID) | INTRAVENOUS | Status: DC | PRN
  Administered 2012-12-24: 25 ug via INTRAVENOUS
  Administered 2012-12-24: 100 ug via INTRAVENOUS
  Administered 2012-12-24: 50 ug via INTRAVENOUS
  Administered 2012-12-27: 100 ug via INTRAVENOUS
  Administered 2012-12-27: 50 ug via INTRAVENOUS
  Filled 2012-12-19: qty 100

## 2012-12-19 MED ORDER — SODIUM CHLORIDE 0.9 % IV SOLN
25.0000 ug/h | INTRAVENOUS | Status: DC
  Administered 2012-12-19: 50 ug/h via INTRAVENOUS
  Administered 2012-12-20: 75 ug/h via INTRAVENOUS
  Administered 2012-12-22: 100 ug/h via INTRAVENOUS
  Administered 2012-12-23: 250 ug/h via INTRAVENOUS
  Administered 2012-12-23: 25 ug/h via INTRAVENOUS
  Administered 2012-12-23: 150 ug/h via INTRAVENOUS
  Administered 2012-12-24: 275 ug/h via INTRAVENOUS
  Administered 2012-12-24: 225 ug/h via INTRAVENOUS
  Administered 2012-12-26 – 2012-12-28 (×3): 125 ug/h via INTRAVENOUS
  Administered 2012-12-29: 150 ug/h via INTRAVENOUS
  Administered 2012-12-29 – 2012-12-30 (×2): 125 ug/h via INTRAVENOUS
  Filled 2012-12-19 (×17): qty 50

## 2012-12-19 MED ORDER — DEXTROSE 5 % IV SOLN
1.5000 g | Freq: Two times a day (BID) | INTRAVENOUS | Status: DC
  Administered 2012-12-19 – 2012-12-20 (×2): 1.5 g via INTRAVENOUS
  Filled 2012-12-19 (×2): qty 1.5

## 2012-12-19 MED ORDER — HEPARIN (PORCINE) IN NACL 100-0.45 UNIT/ML-% IJ SOLN
850.0000 [IU]/h | INTRAMUSCULAR | Status: DC
  Administered 2012-12-20: 800 [IU]/h via INTRAVENOUS
  Administered 2012-12-21: 750 [IU]/h via INTRAVENOUS
  Administered 2012-12-22: 800 [IU]/h via INTRAVENOUS
  Administered 2012-12-23 (×2): 900 [IU]/h via INTRAVENOUS
  Administered 2012-12-24 (×2): 850 [IU]/h via INTRAVENOUS
  Filled 2012-12-19 (×8): qty 250

## 2012-12-19 MED ORDER — FUROSEMIDE 10 MG/ML IJ SOLN
40.0000 mg | Freq: Once | INTRAMUSCULAR | Status: AC
  Administered 2012-12-19: 40 mg via INTRAVENOUS

## 2012-12-19 MED ORDER — CHLORHEXIDINE GLUCONATE 0.12 % MT SOLN
15.0000 mL | Freq: Two times a day (BID) | OROMUCOSAL | Status: DC
  Administered 2012-12-19 – 2013-01-06 (×35): 15 mL via OROMUCOSAL
  Filled 2012-12-19 (×35): qty 15

## 2012-12-19 MED ORDER — MIDAZOLAM BOLUS VIA INFUSION
1.0000 mg | INTRAVENOUS | Status: DC | PRN
  Filled 2012-12-19: qty 2

## 2012-12-19 MED ORDER — SODIUM CHLORIDE 0.9 % IV SOLN
1.0000 g/h | INTRAVENOUS | Status: AC
  Administered 2012-12-19: 1 g/h via INTRAVENOUS
  Filled 2012-12-19 (×3): qty 20

## 2012-12-19 MED ORDER — PANTOPRAZOLE SODIUM 40 MG IV SOLR
40.0000 mg | Freq: Two times a day (BID) | INTRAVENOUS | Status: DC
  Administered 2012-12-19 – 2012-12-26 (×14): 40 mg via INTRAVENOUS
  Filled 2012-12-19 (×19): qty 40

## 2012-12-19 MED FILL — Magnesium Sulfate Inj 50%: INTRAMUSCULAR | Qty: 10 | Status: AC

## 2012-12-19 MED FILL — Potassium Chloride Inj 2 mEq/ML: INTRAVENOUS | Qty: 40 | Status: AC

## 2012-12-19 MED FILL — Sodium Chloride IV Soln 0.9%: INTRAVENOUS | Qty: 100 | Status: AC

## 2012-12-19 MED FILL — Heparin Sodium (Porcine) Inj 1000 Unit/ML: INTRAMUSCULAR | Qty: 10 | Status: AC

## 2012-12-19 MED FILL — Lidocaine HCl IV Inj 20 MG/ML: INTRAVENOUS | Qty: 5 | Status: AC

## 2012-12-19 MED FILL — Sodium Chloride Irrigation Soln 0.9%: Qty: 3000 | Status: AC

## 2012-12-19 MED FILL — Mannitol IV Soln 20%: INTRAVENOUS | Qty: 500 | Status: AC

## 2012-12-19 MED FILL — Heparin Sodium (Porcine) Inj 1000 Unit/ML: INTRAMUSCULAR | Qty: 30 | Status: AC

## 2012-12-19 MED FILL — Sodium Bicarbonate IV Soln 8.4%: INTRAVENOUS | Qty: 50 | Status: AC

## 2012-12-19 MED FILL — Sodium Chloride IV Soln 0.9%: INTRAVENOUS | Qty: 1000 | Status: AC

## 2012-12-19 MED FILL — Electrolyte-R (PH 7.4) Solution: INTRAVENOUS | Qty: 5000 | Status: AC

## 2012-12-19 MED FILL — Aminocaproic Acid Inj 250 MG/ML: INTRAVENOUS | Qty: 20 | Status: AC

## 2012-12-19 NOTE — Progress Notes (Signed)
PT Note Order received to begin POD #2.  Will complete PT evaluation 8/20. Thanks!  [address] [place], [place]  [phone] 12/19/2012

## 2012-12-19 NOTE — CV Procedure (Signed)
Intra-operative Transesophageal Echocardiography Report:  [name] is a 67 year old female with end-stage nonischemic cardiomyopathy who is now scheduled to undergo implantation of a Heartmate II left ventricular assist device. Intraoperative transesophageal echocardiography was indicated to assess the right and left ventricular function and to determine optimal pump function, to assess for any valvular pathology, and to assess for any intracardiac air.  The patient was brought to the operating room at [facility] and general anesthesia was induced without difficulty. Following  endotracheal intubation and orogastric suctioning, the transesophageal echocardiography probe was inserted into the esophagus without difficulty.  Impression: Pre-bypass findings:  1. Aortic valve: The aortic valve was trileaflet. The leaflets opened normally without restriction. There was no aortic insufficiency. There was no thickening or calcification of the leaflets noted.  2. Mitral valve: There was moderate mitral insufficiency. This was due to  dilatation of the mitral annulus and reduced area of coaptation of the leaflets. The papillary muscles were also laterally displaced. There was a central jet of mitral regurgitation which was graded as moderate by color Doppler. The posterior leaflet was billowing mildly but there were no flail or frankly prolapsing segments noted.  3. Left ventricle: There was severe left ventricular dysfunction. The left ventricular cavity was markedly dilated and measured 65 mm at end diastole at the mid-chordal level. The interventricular septum was dyskinetic and thinned. The inferior wall was akinetic. There was hypokinesis noted in the lateral and anterior walls. Ejection fraction was estimated at 15-20%. There was no thrombus noted in the left ventricular apex.  4. Right ventricle: The right ventricular cavity was moderately enlarged. There was  mild hypokinesis of  the right ventricular free wall.  5. Tricuspid valve: There was moderate tricuspid insufficiency. A defibrillator lead was noted passing through the tricuspid valve. Tricuspid annulus measured 3.78 cm in the 4 chamber view at end-diastole and 3.68 cm in the RV inflow outflow view at end-diastole.  6. Interatrial septum: Interatrial septum was intact without evidence of patent foramen ovale or atrial septal defect by color Doppler and bubble study.  7. Left atrium. There was no thrombus noted in the left atrium or left atrial appendage.   8. Pericardium: There was a small pericardial effusion noted which was primarily located in the posterior segment of the pericardial cavity.  9. Ascending aorta: The ascending aorta showed a well-defined aortic root and sinotubular ridge without effacement or aneurysmal dilatation. There was no significant atheromatous disease that could be appreciated in the ascending aorta.  Post-bypass findings:  1. Aortic valve: The aortic valve leaflets did not open and there was no aortic insufficiency.  2. Mitral valve: The mitral annulus was reduced in size and there was a reduction in the size of the left ventricle. This resulted in improved coaptation the mitral leaflets. The mitral regurgitation was then graded as mild following  cardiopulmonary bypass.  3. Left ventricle: Left ventricular cavity was reduced in size and appeared underfilled. The  interventricular septum was appeared bowed from right to left. The LVAD cannula inflow cannula was noted in the apex. Depending on the amount of left ventricular filling, the inflow cannula was more or less oriented towards the inner ventricular septum but not abutting it. There was no turbulence flow within inflow cannula. There was no thrombus noted in the left ventricular apex. There initially was a small amount of intracardiac air within the left ventricle which appeared to resolve over the next 15 minutes following  separation from cardiopulmonary bypass.  4. Right ventricle: There was an annuloplasty ring in the tricuspid position. The right ventricular function appeared reduced from the pre-bypass study. The basal portion of the right ventricular free wall appeared akinetic. The infundibular or outflow section of the right ventricular free wall was contracting showed mildly reduced contractility. The right ventricular cavity was enlarged from the pre-bypass study. Following closure of the chest, the right ventricular cavity reduced in size.  5. Tricuspid valve: There was annuloplasty ring in the tricuspid position there was 1-2+ tricuspid insufficiency following separation from cardiopulmonary bypass.  6. Interatrial septum: There was no evidence of patent foramen ovale or atrial septal defect by color Doppler following separation from cardiopulmonary bypass.  7. Descending aorta: The outflow cannula from the left ventricular assist device could be, visualized in the proximal ascending aorta and there appeared to be no restriction to outflow.  Kipp Brood, MD

## 2012-12-19 NOTE — Progress Notes (Signed)
. HeartMate 2 Rounding Note  Subjective:   Postop day 1 implantation of HeartMate 2 because of classIV heart failure, idiopathic cardiomyopathy. Patient return the OR last night for implantation of centri mag RVAD because of RV dysfunction. Patient had persistent chest tube output 100 150 cc per hour which is now improved after administration of clotting factors. HeartMate 2 flows improved with application of temporary right ventricular assist device. Patient maintained on ventilator but woke up and moved and responded. Urine output is improved and CO-Ox saturations are 70%.    LVAD INTERROGATION:  HeartMate II LVAD:  Flow 4.1 liters/min, speed 8400 power 4.2, PI 4.0.   Objective:    Vital Signs:   Temp:  [95.4 F (35.2 C)-99.9 F (37.7 C)] 99.9 F (37.7 C) (08/19 1900) Pulse Rate:  [44-89] 78 (08/19 1845) Resp:  [0-30] 13 (08/19 1900) BP: (96)/(73) 96/73 mmHg (08/19 1720) SpO2:  [100 %] 100 % (08/19 1900) Arterial Line BP: (69-115)/(66-82) 84/71 mmHg (08/19 1900) FiO2 (%):  [50 %] 50 % (08/19 1900) Weight:  [188 lb 4.8 oz (85.412 kg)] 188 lb 4.8 oz (85.412 kg) (08/19 0500) Last BM Date: 12/15/12 Mean arterial Pressure 84  Intake/Output:   Intake/Output Summary (Last 24 hours) at 12/19/12 1916 Last data filed at 12/19/12 1900  Gross per 24 hour  Intake 10917.02 ml  Output   9376 ml  Net 1541.02 ml     Physical Exam: General: Intubated in ICU on right ventricular assist device HEENT: normal Neck: supple. JVP . Carotids 2+ bilat; no bruits. No lymphadenopathy or thryomegaly appreciated. Cor: Mechanical heart sounds with LVAD hum present. Lungs: clear Abdomen: soft, nontender, nondistended. No hepatosplenomegaly. No bruits or masses. Good bowel sounds. Extremities: no cyanosis, clubbing, rash, edema Neuro: moves all 4 extremities but now is on sedation protocol while on ventilator  Telemetry: A paced 90 per minute  Labs: Basic Metabolic Panel:  Recent Labs Lab  12/15/12 0210  12/17/12 2135 12/18/12 0427  12/18/12 1612  12/18/12 1627 12/18/12 1744  12/18/12 2134 12/18/12 2250 12/18/12 2256 12/19/12 0311 12/19/12 1004 12/19/12 1545  NA 138  < > 135 138  < > 139  < > 141 142  < > 143  --  144 141 142 139  K 4.3  < > 4.0 4.1  < > 3.0*  < > 3.1* 3.3*  < > 3.0*  --  2.9* 4.3 4.6 4.4  CL 103  < > 97 101  --  105  --  104 105  --   --   --   --  110 104 104  CO2 26  < > 27 27  --  22  --   --   --   --   --   --   --  24  --  25  GLUCOSE 98  < > 155* 100*  < > 166*  < > 161* 137*  < > 168*  --  184* 134* 103* 103*  BUN 22  < > 27* 24*  --  18  --  17 17  --   --   --   --  14 12 13   CREATININE 1.56*  < > 1.56* 1.59*  --  1.26*  --  1.30* 1.50*  --   --  1.20*  --  1.09 1.20* 1.12*  CALCIUM 9.8  < > 10.1 10.1  --  8.4  --   --   --   --   --   --   --  7.3*  --  8.2*  MG 2.3  --   --   --   --  2.2  --   --   --   --   --  2.7*  --  3.1*  --  2.4  PHOS  --   --   --   --   --   --   --   --   --   --   --   --   --  3.2  --   --   < > = values in this interval not displayed.  Liver Function Tests:  Recent Labs Lab 12/17/12 2135 12/19/12 0311  AST 33 85*  ALT 44* 17  ALKPHOS 83  --   BILITOT 0.5 3.1*  PROT 6.8  --   ALBUMIN 3.7  --    No results found for this basename: LIPASE, AMYLASE,  in the last 168 hours No results found for this basename: AMMONIA,  in the last 168 hours  CBC:  Recent Labs Lab 12/19/12 0311  12/19/12 0715 12/19/12 0812 12/19/12 0855 12/19/12 1004 12/19/12 1130 12/19/12 1545  WBC 6.9  --  6.0  --  6.6  --  6.4 8.5  NEUTROABS 5.4  --   --   --   --   --   --   --   HGB 6.5*  < > 6.1*  --  5.3* 5.8* 8.3* 8.2*  HCT 18.3*  < > 17.4*  --  15.0* 17.0* 23.6* 23.2*  MCV 85.5  --  84.5  --  84.7  --  82.5 81.1  PLT 103*  --  175 139* 143*  --  111* 108*  < > = values in this interval not displayed.  INR:  Recent Labs Lab 12/17/12 1758 12/18/12 1613 12/19/12 0311 12/19/12 0812 12/19/12 1545  INR 0.99  1.43 1.61* 1.42 1.30    Other results:  EKG:   Imaging: Dg Chest 2 View  12/17/2012   *RADIOLOGY REPORT*  Clinical Data: Preop.  CHEST - 2 VIEW  Comparison: 12/14/2012  Findings: Left AICD remains in place, unchanged.  Cardiomegaly. Right PICC line remains in place, unchanged.  Linear scarring or atelectasis in the right mid lung.  Otherwise, no confluent opacity.  No effusion.  No acute bony abnormality.  IMPRESSION: Stable cardiomegaly.  Right mid lung scarring or atelectasis.   Original Report Authenticated By: Charlett Nose, M.D.   Dg Chest Port 1 View  12/19/2012   **ADDENDUM** CREATED: 12/19/2012 07:41:55  The following is added to the original report, which was chest dictated:  Difficult to exclude a small right apical pneumothorax. Continued attention on follow-up exams is warranted.  **END ADDENDUM** SIGNED BY: Reyes Ivan, M.D.  12/19/2012   *RADIOLOGY REPORT*  Clinical Data: Insertion of an implantable right ventricular assist device.  Right-sided heart failure.  PORTABLE CHEST - 1 VIEW  Comparison: 12/18/2012.  Findings: Endotracheal tube terminates approximately 9 mm above the carina.  Nasogastric tube is followed into the stomach with the tip projecting beyond the inferior boundary of the image.  Right IJ central line tip is obscured by the right IJ Swan-Ganz catheter. Right IJ Swan-Ganz catheter tip projects in the proximal right pulmonary artery.  Left subclavian AICD lead tip projects over the right ventricle.  Bilateral chest tubes are in place.  Interval insertion of a right ventricular assist device with tips projecting over right atrial appendage and right ventricular outflow tract. Left ventricular assist  device projects over the cardiac apex region. Sternal wires have been removed in the interval.  Heart size stable.  Lungs are low in volume with scattered atelectasis.  No definite pleural fluid.  IMPRESSION:  1.  Endotracheal tube is low lying.  Retracting approximately 2 cm  would better position the tip above the carina. These results will be called to the ordering clinician or representative by the Radiologist Assistant, and communication documented in the PACS Dashboard. 2.  Interval insertion of a right ventricular assist device with a cannula tips projecting over the right atrial appendage and right ventricular outflow tract. 3.  Scattered atelectasis bilaterally.   Original Report Authenticated By: Leanna Battles, M.D.   Dg Chest Port 1 View  12/18/2012   *RADIOLOGY REPORT*  Clinical Data: Interval placement of ventricular assist device.  PORTABLE CHEST - 1 VIEW  Comparison: 12/17/2012  Findings: That the endotracheal tube tip is above the carina. There is a Swan-Ganz catheter with tip in the main pulmonary artery.  A left chest wall ICD is noted with lead in the right ventricle.  Bilateral chest tubes are in place.  No pneumothorax identified.  Ventricular assist device is noted in the projection of the left lower hemithorax and left upper quadrant of the abdomen. Atelectasis is identified within both mid lungs and both lung bases.  IMPRESSION:  1. Support apparatus as described above.  2.  No complicating features identified.  No pneumothorax noted.   Original Report Authenticated By: Signa Kell, M.D.      Medications:     Scheduled Medications: . acetaminophen  1,000 mg Oral Q6H   Or  . acetaminophen (TYLENOL) oral liquid 160 mg/5 mL  1,000 mg Per Tube Q6H  . acetaminophen (TYLENOL) oral liquid 160 mg/5 mL  650 mg Per Tube Once   Or  . acetaminophen  650 mg Rectal Once  . antiseptic oral rinse  15 mL Mouth Rinse QID  . aspirin EC  325 mg Oral Daily   Or  . aspirin  324 mg Per Tube Daily   Or  . aspirin  300 mg Rectal Daily  . atorvastatin  20 mg Oral q1800  . bisacodyl  10 mg Oral Daily   Or  . bisacodyl  10 mg Rectal Daily  . cefUROXime (ZINACEF)  IV  1.5 g Intravenous Q12H  . chlorhexidine  15 mL Mouth Rinse BID  . digoxin  0.125 mg Oral Daily   . docusate sodium  200 mg Oral Daily  . insulin regular  0-10 Units Intravenous TID WC  . magnesium sulfate 1 - 4 g bolus IVPB  2 g Intravenous Once  . mupirocin ointment  1 application Topical BID  . pantoprazole (PROTONIX) IV  40 mg Intravenous Q12H  . polyethylene glycol  17 g Oral Daily  . potassium chloride  20 mEq Oral Daily  . sodium bicarbonate  50 mEq Intravenous Once  . sodium chloride  3 mL Intravenous Q12H  . sodium chloride  3 mL Intravenous Q12H  . vancomycin  1,000 mg Intravenous Q24H     Infusions: . sodium chloride 20 mL/hr at 12/19/12 1700  . sodium chloride 10 mL/hr at 12/15/12 1900  . sodium chloride 11 mL/hr at 12/19/12 1900  . sodium chloride    . amiodarone (NEXTERONE PREMIX) 360 mg/200 mL dextrose 30 mg/hr (12/19/12 1900)  . dexmedetomidine 0.3 mcg/kg/hr (12/19/12 1900)  . DOBUTamine 3.932 mcg/kg/min (12/18/12 1924)  . DOPamine 3 mcg/kg/min (12/18/12 1924)  . EPINEPHrine  infusion 5 mcg/min (12/19/12 0537)  . fentaNYL infusion INTRAVENOUS 75 mcg/hr (12/19/12 1900)  . heparin    . heparin    . heparin    . insulin (NOVOLIN-R) infusion 1.8 mL/hr at 12/19/12 1900  . lactated ringers 20 mL/hr at 12/19/12 1900  . midazolam (VERSED) infusion 2 mg/hr (12/19/12 1900)  . nitroGLYCERIN Stopped (12/18/12 1705)  . norepinephrine (LEVOPHED) Adult infusion 4 mcg/min (12/19/12 0030)  . phenylephrine (NEO-SYNEPHRINE) Adult infusion 20 mcg/min (12/19/12 0309)  . 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 Patient may be started on IV heparin for the Center mag pump. Plan on full support with right ventricle assist device for another 48 hours before attempt at weaning. I discussed the situation and the plan of care with the patient's family.  I reviewed the LVAD parameters from today, and compared the results to the patient's prior recorded data.  No programming changes were made.  The LVAD is functioning within specified parameters.  The patient performs LVAD self-test daily.  LVAD interrogation was negative for any significant power changes, alarms or PI events/speed drops.  LVAD equipment check completed and is in good working order.  Back-up equipment present.   LVAD education done on emergency procedures and precautions and reviewed exit site care.  Length of Stay: 11  Kaitlin [name],Kaitlin [name] 12/19/2012, 7:16 PM

## 2012-12-19 NOTE — Progress Notes (Signed)
   She went back to OR tonight for Centrimag RVAD support.  Now improved though continues to have periods of low flow which are responsive to volume loading. Need to keep CVP around 15.   Currently LVAD at 8400 and RVAD at 2400.   Co-ox 73%.   Epi at 10. Will start trying to wean. Continue NO and dobutamine.   I was at bedside all evening including to OR.    The patient is critically ill with multiple organ systems failure and requires high complexity decision making for assessment and support, frequent evaluation and titration of therapies, application of advanced monitoring technologies and extensive interpretation of multiple databases.   Critical Care Time devoted to patient care services described in this note is 90 Minutes.  Rylee Huestis,MD 12:12 AM

## 2012-12-19 NOTE — Op Note (Signed)
Kaitlin [name], Kaitlin [name] NO.:  [id number]  MEDICAL RECORD NO.:  [id number]  LOCATION:                               FACILITY:  [facility]  PHYSICIAN:  Kerin Perna, M.D.  DATE OF BIRTH:  1946-01-04  DATE OF PROCEDURE:  12/18/2012 DATE OF DISCHARGE:                              OPERATIVE REPORT   OPERATION: 1. Implantation of HeartMate II left ventricular assist device. 2. Tricuspid ring annuloplasty-using Edwards MC3 ring, serial     [id number]. 3. Placement of left femoral A-line for blood pressure monitoring.  SURGEON:  Kerin Perna, MD.  ASSISTING SURGEON:  Evelene Croon, M.D.  ANESTHESIA:  General, Guadalupe Maple, M.D.  PREOPERATIVE DIAGNOSES:  Idiopathic cardiomyopathy, class IV heart failure with cardiogenic shock, requiring continuous inotropes, moderate to severe tricuspid regurgitation.  POSTOPERATIVE DIAGNOSES:  Idiopathic cardiomyopathy, class IV heart failure with cardiogenic shock, requiring continuous inotropes, moderate to severe tricuspid regurgitation.  INDICATIONS:  The patient is a 67 year old, African American female with a several year history of systolic heart failure, previous implantation of AICD and recent admission for progression of heart failure to class IV symptoms and cardiogenic shock, by hemodynamic measurements.  She was placed on inotropes and monitored with pulmonary artery catheter by the Heart Failure-Cardiology Service.  Attempts at weaning the inotropes were unsuccessful and the ejection fraction was 15-20%.  She is recommended to remain in the hospital until a implantable left ventricular assist device was performed in combination with tricuspid ring annuloplasty for a tricuspid regurgitation.  Prior to surgery, I examined the patient in her hospital room on multiple occasions and reviewed her situation with end-stage class IV heart failure, refractory to IV inotropes and optimal medical care.  She was discussed in  the heart failure-VAD weekly clinical conference and was filled after assessment by the multidisciplinary team to be an appropriate candidate for implantable LVAD therapy.  After discussions with the patient and her Heart Failure-Cardiology Service, the decision was made to proceed with implantation of a ventricular assist device for destination therapy of her refractory end-stage heart disease.  OPERATIVE FINDINGS: 1. Severe tricuspid regurgitation with deformity of the septal leaflet     of the tricuspid valve from a long-standing indwelling AICD lead. 2. Short thoracic height making implantation of the HeartMate II pump     more challenging due to size and space constraints. 3. Evidence of RV dysfunction despite tricuspid annuloplasty ring and     inotropic support.  OPERATIVE PROCEDURE IN DETAIL:  The patient was brought to the operating room and placed supine on the operating table, where general anesthesia was induced under invasive hemodynamic monitoring.  A transesophageal echo probe was placed by the anesthesiologist which confirmed the preoperative diagnosis of severe LV dysfunction, moderate RV dysfunction, and severe tricuspid regurgitation.  The patient was prepped and draped as a sterile field.  A proper time- out was performed.  A sternal incision was made and the sternum was divided.  The sternal elevating retractor was placed and the attachments to the left hemidiaphragm were taken down to create the pocket for the VAD pump.  The diaphragm was taken off the lateral chest wall laterally as well as deep from the  posterior rectus sheath down to the level of the umbilicus.  The pocket was extended to the midline and to the right side as well.  Next, a counterincision was made just to the right of the umbilicus and carried down to the rectus sheath.  The driveline tunnels were placed for the later placement of the power cord from the VAD.  Heparin was then  administered.  The sternal retractor was placed.  The pericardium was opened and suspended.  There was a moderate pericardial effusion which was drained.  Pursestrings were placed in the ascending aorta and in the superior vena cava.  When the ACT was documented as being therapeutic, the patient was cannulated and placed on the cardiopulmonary bypass.  A second pursestring was placed in the low right atrium, and a second cannula was placed in the IVC for bicaval drainage.  Caval tapes were placed.  While the patient was at the beading, the caval tapes were tightened.  A transverse right atriotomy was performed and the right atrium was opened and the tricuspid valve inspected.  The AICD lead was densely adherent to the septal leaflet with some deformity.  The AICD lead was gently separated from the leaflet and completely mobilized from the leaflet tissue.  Next, annular 2-0 Ethibond sutures were placed around the tricuspid anulus measuring 15 total sutures.  The anterior leaflet was then sized to a 28 mm MC3 ring and the ring was provided.  The annuloplasty sutures were then placed through the ring.  The ring was seated and the sutures were tied.  A saline test showed that there was good coaptation of the leaflets with some oblique in the area of the septal and posterior leaflets from the previous leaflet damage from the AICD lead, but it was felt that this was not going to be hemodynamically significant.  The atriotomy was then closed with running 4-0 Prolene in 2 layers.  Next, a partial occluding clamp was placed on the ascending aorta, and the outflow graft after being trimmed to the appropriate length was sewn end-to-side with running 4-0 Prolene.  A fine Code of CoSeal was placed around the anastomosis and the partial occluding clamp was removed and a DeBakey vascular clamp was placed on the graft.  The apex of the heart was delivered into the field and supported with lap pads.   The site of the apical cannulation was identified slightly above the 2 apex and slightly medial towards the LAD.  The LV was entered with an 11 blade and through that a Foley catheter was inserted and the 5 mL balloon blown up for counter traction against the suture cutting device and the plug of myocardium was excised with scissors. Further trabeculated tissue and the chamber was then trimmed away to allow good entry of the apical cannula.  A 2-0 Ethibond sutures were placed in the circumferential manner around the opening #15 total.  Next, the silastic collar was then sewn onto the LV apex for insertion of the cannula after placing the sutures through the sewing ring of the silastic cuff and tying all the sutures.  Volume was left into the patient from the pump and the heart was filled. The inflow cannula was then inserted into the silastic cuff and the suture was tied.  A tight band was then tightened around the connection and #4 Ethibond sutures were also used to tighten the connection above and below the tie band.  The outflow graft was connected to the upper pole aspect  of the pump again using the usual de-airing maneuvers.  The surgical field had been insufflated with carbon dioxide during the entire operation.  After this connection was made, the lung re-expanded.  Temporary pacing wires were applied.  A vascular clamp was placed on the outflow graft and a 20-gauge needle was inserted just above the pump to remove any air.  At this point, the driveline was tunneled through the 2 previously made incisions, connected to the regulator and we prepared to wean from bypass.  Low-dose inotropes were started.  The patient was paced and being ventilated.  The patient was slowly transitioned from cardiopulmonary bypass to LVAD support by increasing the rpm's first from 6000, then to 7000, then to 8000 rpm's to provide adequate flow.  The transition was also monitored carefully by  the transesophageal echo by the anesthesiologist, Dr. Noreene Larsson.  The patient separated from bypass slowly and required some inotropic support due to some RV dysfunction.  The TEE showed improvement in the amount of TR.  The patient was observed carefully and hemodynamics were adequate. There was clearly though a certain evidence of RV dysfunction, but with inotropic support and optimization of hemoglobin, acid-base, and temperature, the patient was clinically stable.  We proceeded to give the protamine.  The patient was given a unit of platelets for a low platelet count.  Chest tube drains were placed in both pleural space, the anterior mediastinum and a pocket drain was placed beneath the LVAD pump.  The LVAD was then secured with a heavy #2 Ethibond suture to the posterior rectus sheath to keep it from migrating cephalad.  The mediastinum was irrigated.  The sternum was closed with interrupted steel wire.  The pectoralis fascia was closed with interrupted Vicryl. The subcutaneous and skin layers were closed in running Vicryl.  The patient remained stable.  The Connor incision was then closed with interrupted 3-0 Vicryl in a subcuticular skin suture.  The driveline exit site was closed with two 3- 0 Vicryl subcutaneous stitches and two 3-0 nylon skin sutures.  Sterile dressings were applied.  The patient returned to the intensive care unit in critical, but stable condition.  Total cardiopulmonary bypass time was 158 minutes.     Kerin Perna, M.D.     PV/MEDQ  D:  12/18/2012  T:  12/19/2012  Job:  [id number]

## 2012-12-19 NOTE — Progress Notes (Signed)
PT Cancellation Note  Patient Details Name: Kaitlin [name] MRN: [id number] DOB: [birth year]   Cancelled Treatment:    Reason Eval/Treat Not Completed: Patient not medically ready (on centrimag and sedated, will sign off await new orders when medically appropriate for therapies)   Fabio Asa 12/19/2012, 8:36 AM

## 2012-12-19 NOTE — Progress Notes (Signed)
5 bags of cryoprecipitate given per request. Bag #s [id number] A POS, W2012 13 [id number] APOS, 12012 13 [id number] APOS, W2012 13 [id number] APOS, [id number] 13 [id number] APOS,  All to expire on 12/19/12 @1410 . Infused on 12/19/12 @1235 .

## 2012-12-19 NOTE — Progress Notes (Signed)
Anesthesiology Follow-up:  As noted 67 year old female with end-stage non-ischemic cardiomyopathy who underwent insertion of Heartmate II LVAD yesterday. Following surgery, she had progressive reduction in LVAD output due to RV failure and  was brought back to the OR for insertion of Centrimag RVAD. This resulted in a marked improvement in hemodynamics, urine output, and mixed venous O2 Sat.   She now remains intubated and sedated with moderate chest tube output.    VS: T-37.1 BP 91/71 (78) HR 76 PA 30/25 CVP 18   RVAD: 2600 RPM 4 L/M flow rate LVAD 8400 RPM 4 L/M flow, 5.2 watts power consumption,   SIMV TV 450 RR 14 PEEP 10 FiO2 50%  PH 7.32 PCO2 46 PO2 210  mixed venous sat: 69%  H/H 8.3/23.6 Plts 111,000  BUN/Cr. 14/1.09 K: 4.3  Plan to continue full support with ventilator, Nitric Oxide, pressors, and RVAD. Plan to return to OR and wean RVAD in  2-3 days.  Kipp Brood, MD

## 2012-12-19 NOTE — Progress Notes (Signed)
NUTRITION FOLLOW-UP/CONSULT  DOCUMENTATION CODES Per approved criteria  -Not Applicable   INTERVENTION: If prolonged intubation expected, recommend initiation of nutrition support. If enteral nutrition desired, recommend initiation of Vital AF 1.2 via enteral feeding tube at 20 ml/hr, advance by 10 ml q 4 hours, to goal of 30 ml/hr. Add 30 ml Prostat liquid protein via tube QID. Goal regimen will provide: 1264 kcal, 114 grams protein, 584 ml free water. RD to continue to follow nutrition care plan.  NUTRITION DIAGNOSIS: Inadequate oral intake now related to inability to eat as evidenced by NPO status.  Goal: Initiate nutrition support within 24-48 hours of intubation. Intake to meet >90% of estimated nutrition needs.  Monitor:  Vent settings, labs, weight trend, post-op healing and recovery  ASSESSMENT: Patient S/P right heart cath on admission.   Underwent the following 8/18: INSERTION OF IMPLANTABLE LEFT VENTRICULAR ASSIST DEVICE  INTRAOPERATIVE TRANSESOPHAGEAL ECHOCARDIOGRAM  TRICUSPID VALVE REPAIR  INSERTION OF IMPLANTABLE RIGHT VENTRICULAR ASSIST DEVICE   Pt found to have R ventricular dysfunction s/p LVAD implantation, required trip to OR later that evening for placement of RVAD. Pt now with LVAD and RVAD.  Patient is currently intubated on ventilator support.  MV: 7.1 L/min Temp:Temp (24hrs), Avg:96.8 F (36 C), Min:95.4 F (35.2 C), Max:98.6 F (37 C)  Propofol: none  RD consulted for "LVAD patient."  Height: Ht Readings from Last 1 Encounters:  12/18/12 5' 2.99" (1.6 m)    Weight: Wt Readings from Last 1 Encounters:  12/19/12 188 lb 4.8 oz (85.412 kg)  Admission wt: 165 lb  BMI:  Body mass index is 29.3 kg/(m^2) - using admission weight.  Estimated Nutritional Needs: Kcal: 1391 Protein: 112 - 131 gm Fluid: 1.5 - 2 L  Skin:  Abdomen incision Chest incision - with wound VAC  Diet Order: NPO  EDUCATION NEEDS: -Education needs addressed. Provided  many handouts on heart healthy eating, including label reading tips per patient request on 8/15.   Intake/Output Summary (Last 24 hours) at 12/19/12 0836 Last data filed at 12/19/12 0830  Gross per 24 hour  Intake 13868.72 ml  Output  16109 ml  Net 3863.72 ml    Last BM: 8/15  Labs:   Recent Labs Lab 12/18/12 0427  12/18/12 1612  12/18/12 1627  12/18/12 2049 12/18/12 2134 12/18/12 2250 12/19/12 0311  NA 138  < > 139  < > 141  < > 141 143  --  141  K 4.1  < > 3.0*  < > 3.1*  < > 3.3* 3.0*  --  4.3  CL 101  --  105  --  104  --   --   --   --  110  CO2 27  --  22  --   --   --   --   --   --  24  BUN 24*  --  18  --  17  --   --   --   --  14  CREATININE 1.59*  --  1.26*  --  1.30*  --   --   --  1.20* 1.09  CALCIUM 10.1  --  8.4  --   --   --   --   --   --  7.3*  MG  --   --  2.2  --   --   --   --   --  2.7* 3.1*  PHOS  --   --   --   --   --   --   --   --   --  3.2  GLUCOSE 100*  < > 166*  < > 161*  < > 182* 168*  --  134*  < > = values in this interval not displayed.  CBG (last 3)   Recent Labs  12/18/12 1739 12/18/12 1810 12/19/12 0003  GLUCAP 146* 133* 185*    Scheduled Meds: . acetaminophen  1,000 mg Oral Q6H   Or  . acetaminophen (TYLENOL) oral liquid 160 mg/5 mL  1,000 mg Per Tube Q6H  . acetaminophen (TYLENOL) oral liquid 160 mg/5 mL  650 mg Per Tube Once   Or  . acetaminophen  650 mg Rectal Once  . aminocaproic acid (AMICAR) for OHS   Intravenous To OR  . antiseptic oral rinse  15 mL Mouth Rinse QID  . aspirin EC  325 mg Oral Daily   Or  . aspirin  324 mg Per Tube Daily   Or  . aspirin  300 mg Rectal Daily  . aspirin  81 mg Oral Daily  . atorvastatin  20 mg Oral q1800  . bisacodyl  10 mg Oral Daily   Or  . bisacodyl  10 mg Rectal Daily  . cefUROXime (ZINACEF)  IV  750 mg Intravenous To OR  . chlorhexidine  15 mL Mouth Rinse BID  . digoxin  0.125 mg Oral Daily  . DOBUTamine  2-10 mcg/kg/min Intravenous To OR  . docusate sodium  200 mg  Oral Daily  . DOPamine  0-10 mcg/kg/min Intravenous To OR  . epinephrine  0-10 mcg/min Intravenous To OR  . famotidine (PEPCID) IV  20 mg Intravenous Q12H  . heparin 30,000 units/NS 1000 mL solution for CELLSAVER   Other To OR  . insulin regular  0-10 Units Intravenous TID WC  . magnesium sulfate  40 mEq Other To OR  . magnesium sulfate 1 - 4 g bolus IVPB  2 g Intravenous Once  . milrinone  0.3 mcg/kg/min Intravenous To OR  . mupirocin cream   Topical BID  . mupirocin ointment  1 application Topical BID  . nitroGLYCERIN  0-100 mcg/min Intravenous To OR  . norepinephrine (LEVOPHED) Adult infusion  0-12 mcg/min Intravenous To OR  . [START ON 12/20/2012] pantoprazole  40 mg Oral Daily  . phenylephrine (NEO-SYNEPHRINE) Adult infusion  0-100 mcg/min Intravenous To OR  . polyethylene glycol  17 g Oral Daily  . potassium chloride  20 mEq Oral Daily  . potassium chloride  80 mEq Other To OR  . rifampin  600 mg Oral Once  . sodium bicarbonate  50 mEq Intravenous Once  . sodium chloride  3 mL Intravenous Q12H  . sodium chloride  3 mL Intravenous Q12H  . vancomycin  1,000 mg Intravenous Q24H  . vasopressin (PITRESSIN) infusion - *FOR SHOCK*  0.04 Units/min Intravenous To OR    Continuous Infusions: . sodium chloride 20 mL/hr at 12/18/12 1900  . sodium chloride 10 mL/hr at 12/15/12 1900  . sodium chloride 10 mL/hr at 12/18/12 1900  . sodium chloride    . amiodarone (NEXTERONE PREMIX) 360 mg/200 mL dextrose 30 mg/hr (12/19/12 0800)  . dexmedetomidine 0.7 mcg/kg/hr (12/19/12 0700)  . DOBUTamine 3.932 mcg/kg/min (12/18/12 1924)  . DOPamine 3 mcg/kg/min (12/18/12 1924)  . EPINEPHrine infusion 5 mcg/min (12/19/12 0537)  . heparin    . heparin    . heparin    . insulin (NOVOLIN-R) infusion 4 Units/hr (12/19/12 0700)  . lactated ringers 20 mL/hr at 12/18/12 1900  . nitroGLYCERIN Stopped (12/18/12 1705)  . norepinephrine (LEVOPHED)  Adult infusion 4 mcg/min (12/19/12 0030)  . phenylephrine  (NEO-SYNEPHRINE) Adult infusion 20 mcg/min (12/19/12 0309)  . vasopressin (PITRESSIN) infusion - *FOR SHOCK*       Jarold Motto MS, RD, LDN Pager: [phone] After-hours pager: [phone]

## 2012-12-19 NOTE — Progress Notes (Signed)
T. CTS p.m. Rounds  Patient with improved hemodynamics, improved LVAD flow rates this evening Chest tube drainage is significantly improved and will probably start IV heparin protocol for center mag soon Hemoglobin 8.2 we'll transfuse one unit packed cells this evening

## 2012-12-19 NOTE — Progress Notes (Signed)
CRITICAL VALUE ALERT  Critical value received:  Hgb 6.5  Date of notification:  12/19/12  Time of notification:  0330  Critical value read back: yes  Nurse who received alert:  L. Absher RN  MD notified (1st page):  Dr. Donata Clay  Time of first page:  0358  MD notified (2nd page):  Time of second page:  Responding MD:  Dr. Zenaida Niece trigt   Time MD responded: 0400

## 2012-12-19 NOTE — Progress Notes (Signed)
  She is much improved this afternoon. VAD flow now 4L/min. She has had good urine output > 4L . MAPs stable. Hgb stable.   She is starting to get some pulsativity in her PA tracing suggesting some return of RV function.   Will start to wean epi slowly. Continue post-op care per TCTS.  [name] 6:39 PM

## 2012-12-19 NOTE — Progress Notes (Signed)
ADVANCED HF Rounding Note  Ms. 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 EF 15-25% with mild to moderate RV dysfunction  Underwent HMII LVAD placement TV repair 8/18 without complication. Taken back for RVAD support last night.  Remains intubated/sedated. RVAD at 2400, LVAD at 2400. No low flow alarms since 2:15a  Remains: EPI @ 5, Dobut @ 4, Levophed @ 4, DOPA @ 3, NEO @ 20. On NO at 35 ppm.   Making good urine. Cr 1.09 Co-ox 60%. Continue to drain 100+cc/hr from each chest tube. Wound vac oozing.  Got blood products this am but repeat Hgb 5.9. We are rechecking this. CVP was 24 but given lasix 40 IV and now 12-13  MAP ~70.  VAD 8400 Flow --, PI 2.9 Power 4.0  AV pacing on tele     Current Facility-Administered Medications  Medication Dose Route Frequency Provider Last Rate Last Dose  . 0.45 % sodium chloride infusion   Intravenous Continuous Kerin Perna, MD 20 mL/hr at 12/18/12 1900    . 0.9 %  sodium chloride infusion  250 mL Intravenous PRN Dolores Patty, MD   10 mL at 12/16/12 0900  . 0.9 %  sodium chloride infusion   Intravenous Continuous Dolores Patty, MD 10 mL/hr at 12/15/12 1900    . 0.9 %  sodium chloride infusion   Intravenous Continuous Kerin Perna, MD 10 mL/hr at 12/18/12 1900    . 0.9 %  sodium chloride infusion  250 mL Intravenous Continuous Kerin Perna, MD      . acetaminophen (TYLENOL) tablet 1,000 mg  1,000 mg Oral Q6H Kerin Perna, MD       Or  . acetaminophen (TYLENOL) solution 1,000 mg  1,000 mg Per Tube Q6H Kerin Perna, MD      . acetaminophen (TYLENOL) solution 650 mg  650 mg Per Tube Once Kerin Perna, MD       Or  . acetaminophen (TYLENOL) suppository 650 mg  650 mg Rectal Once Kerin Perna, MD      . aminocaproic acid (AMICAR) 10 g in sodium chloride 0.9 % 100 mL infusion   Intravenous To OR Kerin Perna, MD      . amiodarone (NEXTERONE PREMIX) 360 mg/200  mL dextrose IV infusion  30 mg/hr Intravenous Continuous Kerin Perna, MD 16.7 mL/hr at 12/19/12 0600 30 mg/hr at 12/19/12 0600  . antiseptic oral rinse (BIOTENE) solution 15 mL  15 mL Mouth Rinse QID Kerin Perna, MD      . aspirin EC tablet 325 mg  325 mg Oral Daily Kerin Perna, MD       Or  . aspirin chewable tablet 324 mg  324 mg Per Tube Daily Kerin Perna, MD       Or  . aspirin suppository 300 mg  300 mg Rectal Daily Kerin Perna, MD      . aspirin chewable tablet 81 mg  81 mg Oral Daily [name] D [name], NP   81 mg at 12/17/12 0939  . atorvastatin (LIPITOR) tablet 20 mg  20 mg Oral q1800 Dolores Patty, MD   20 mg at 12/17/12 1726  . bisacodyl (DULCOLAX) EC tablet 10 mg  10 mg Oral Daily Kerin Perna, MD       Or  . bisacodyl (DULCOLAX) suppository 10 mg  10 mg Rectal Daily Theron Arista  Donata Clay, MD      . cefUROXime (ZINACEF) 750 mg in dextrose 5 % 50 mL IVPB  750 mg Intravenous To OR Kerin Perna, MD      . chlorhexidine (PERIDEX) 0.12 % solution 15 mL  15 mL Mouth Rinse BID Kerin Perna, MD   15 mL at 12/19/12 0515  . desmopressin (DDAVP) 20 mcg in sodium chloride 0.9 % 50 mL IVPB  20 mcg Intravenous Once Kerin Perna, MD   20 mcg at 12/19/12 0703  . dexmedetomidine (PRECEDEX) 200 MCG/50ML infusion  0.1-0.7 mcg/kg/hr Intravenous Continuous Kerin Perna, MD 13.1 mL/hr at 12/19/12 0646 0.7 mcg/kg/hr at 12/19/12 0646  . digoxin (LANOXIN) tablet 0.125 mg  0.125 mg Oral Daily Dolores Patty, MD   0.125 mg at 12/17/12 0940  . diphenhydrAMINE (BENADRYL) capsule 25 mg  25 mg Oral QHS PRN Dolores Patty, MD   25 mg at 12/14/12 2342  . DOBUTamine (DOBUTREX) infusion 4000 mcg/mL  4 mcg/kg/min Intravenous Titrated Dolores Patty, MD 4.5 mL/hr at 12/18/12 1924 3.932 mcg/kg/min at 12/18/12 1924  . DOBUTamine (DOBUTREX) infusion 4000 mcg/mL  2-10 mcg/kg/min Intravenous To OR Kerin Perna, MD 4.5 mL/hr at 12/19/12 0600 4 mcg/kg/min at 12/19/12 0600  . docusate  sodium (COLACE) capsule 200 mg  200 mg Oral Daily Kerin Perna, MD      . DOPamine (INTROPIN) 800 mg in dextrose 5 % 250 mL infusion  3-10 mcg/kg/min Intravenous Continuous Kerin Perna, MD 4.2 mL/hr at 12/18/12 1924 3 mcg/kg/min at 12/18/12 1924  . DOPamine (INTROPIN) 800 mg in dextrose 5 % 250 mL infusion  0-10 mcg/kg/min Intravenous To OR Kerin Perna, MD 4.2 mL/hr at 12/19/12 0600 3 mcg/kg/min at 12/19/12 0600  . EPINEPHrine (ADRENALIN) 4,000 mcg in dextrose 5 % 250 mL infusion  5-8 mcg/min Intravenous Continuous Kerin Perna, MD 18.8 mL/hr at 12/19/12 0537 5 mcg/min at 12/19/12 0537  . EPINEPHrine (ADRENALIN) 4,000 mcg in dextrose 5 % 250 mL infusion  0-10 mcg/min Intravenous To OR Kerin Perna, MD 18.8 mL/hr at 12/19/12 0600 5 mcg/min at 12/19/12 0600  . famotidine (PEPCID) IVPB 20 mg  20 mg Intravenous Q12H Kerin Perna, MD   20 mg at 12/18/12 1600  . fluconazole (DIFLUCAN) IVPB 400 mg  400 mg Intravenous Once Kerin Perna, MD   400 mg at 12/19/12 1610  . heparin 30,000 units/NS 1000 mL solution for CELLSAVER   Other To OR Kerin Perna, MD      . heparin ADULT infusion 100 units/mL (25000 units/250 mL)  500 Units/hr Intravenous Continuous Kerin Perna, MD      . heparin ADULT infusion 100 units/mL (25000 units/250 mL)  500 Units/hr Intravenous Continuous Kerin Perna, MD      . heparin ADULT infusion 100 units/mL (25000 units/250 mL)  500 Units/hr Intravenous Continuous Kerin Perna, MD      . heparin bolus via infusion 500-1,000 Units  500-1,000 Units Intravenous PRN Kerin Perna, MD      . insulin regular (NOVOLIN R,HUMULIN R) 1 Units/mL in sodium chloride 0.9 % 100 mL infusion   Intravenous Continuous Kerin Perna, MD 4.3 mL/hr at 12/19/12 0600 4.3 Units/hr at 12/19/12 0600  . insulin regular bolus via infusion 0-10 Units  0-10 Units Intravenous TID WC Kerin Perna, MD      . lactated ringers infusion   Intravenous Continuous Kerin Perna, MD 20  mL/hr at 12/18/12 1900    . magnesium sulfate (IV Push/IM) injection 40 mEq  40 mEq Other To OR Kerin Perna, MD      . magnesium sulfate IVPB 2 g 50 mL  2 g Intravenous Once Kerin Perna, MD      . midazolam (VERSED) injection 2 mg  2 mg Intravenous Q1H PRN Kerin Perna, MD   2 mg at 12/19/12 1610  . milrinone (PRIMACOR) infusion 200 mcg/mL (0.2 mg/ml)  0.3 mcg/kg/min Intravenous To OR Kerin Perna, MD      . morphine 2 MG/ML injection 2-5 mg  2-5 mg Intravenous Q1H PRN Kerin Perna, MD      . mupirocin cream (BACTROBAN) 2 %   Topical BID Kerin Perna, MD      . mupirocin ointment (BACTROBAN) 2 % 1 application  1 application Topical BID Kerin Perna, MD      . nitroGLYCERIN 0.2 mg/mL in dextrose 5 % infusion  0-100 mcg/min Intravenous Continuous Kerin Perna, MD      . nitroGLYCERIN 0.2 mg/mL in dextrose 5 % infusion  0-100 mcg/min Intravenous To OR Kerin Perna, MD      . norepinephrine (LEVOPHED) 8 mg in dextrose 5 % 250 mL infusion  5-10 mcg/min Intravenous Continuous Kerin Perna, MD 7.5 mL/hr at 12/19/12 0030 4 mcg/min at 12/19/12 0030  . norepinephrine (LEVOPHED) 8 mg in dextrose 5 % 250 mL infusion  0-12 mcg/min Intravenous To OR Kerin Perna, MD 7.5 mL/hr at 12/19/12 0600 4 mcg/min at 12/19/12 0600  . ondansetron (ZOFRAN) injection 4 mg  4 mg Intravenous Q6H PRN Dolores Patty, MD      . ondansetron [facility]) injection 4 mg  4 mg Intravenous Q6H PRN Kerin Perna, MD      . oxyCODONE (Oxy IR/ROXICODONE) immediate release tablet 5-10 mg  5-10 mg Oral Q3H PRN Kerin Perna, MD      . Melene Muller ON 12/20/2012] pantoprazole (PROTONIX) EC tablet 40 mg  40 mg Oral Daily Kerin Perna, MD      . phenylephrine (NEO-SYNEPHRINE) 20,000 mcg in dextrose 5 % 250 mL infusion  0-100 mcg/min Intravenous Titrated Kerin Perna, MD 15 mL/hr at 12/19/12 0309 20 mcg/min at 12/19/12 0309  . phenylephrine (NEO-SYNEPHRINE) 20,000 mcg in dextrose 5 % 250 mL infusion  0-100  mcg/min Intravenous To OR Kerin Perna, MD 15 mL/hr at 12/19/12 0600 20 mcg/min at 12/19/12 0600  . polyethylene glycol (MIRALAX / GLYCOLAX) packet 17 g  17 g Oral Daily Dolores Patty, MD   17 g at 12/17/12 0939  . potassium chloride (K-DUR) CR tablet 20 mEq  20 mEq Oral Daily [name] D [name], NP   20 mEq at 12/17/12 0940  . potassium chloride injection 80 mEq  80 mEq Other To OR Kerin Perna, MD      . rifampin (RIFADIN) capsule 600 mg  600 mg Oral Once Kerin Perna, MD      . sodium bicarbonate injection 50 mEq  50 mEq Intravenous Once Kathlee Nations Trigt, MD      . sodium chloride 0.9 % injection 10-40 mL  10-40 mL Intracatheter PRN Dolores Patty, MD   10 mL at 12/17/12 0940  . sodium chloride 0.9 % injection 3 mL  3 mL Intravenous Q12H Dolores Patty, MD   3 mL at 12/17/12 2219  . sodium chloride 0.9 % injection 3 mL  3 mL Intravenous PRN Dolores Patty, MD      . sodium chloride 0.9 % injection 3 mL  3 mL Intravenous Q12H Kerin Perna, MD      . sodium chloride 0.9 % injection 3 mL  3 mL Intravenous PRN Kerin Perna, MD      . vancomycin (VANCOCIN) IVPB 1000 mg/200 mL premix  1,000 mg Intravenous Q24H Kerin Perna, MD      . vasopressin (PITRESSIN) 50 Units in sodium chloride 0.9 % 250 mL infusion  0.04 Units/min Intravenous To OR Kerin Perna, MD      . vasopressin (PITRESSIN) 50 Units in sodium chloride 0.9 % 250 mL infusion  0.01-0.04 Units/min Intravenous Continuous PRN Kerin Perna, MD        PHYSICAL EXAM: Filed Vitals:   12/19/12 0600 12/19/12 0615 12/19/12 0630 12/19/12 0645  BP:      Pulse: 88 88    Temp: 96.6 F (35.9 C) 96.6 F (35.9 C) 96.6 F (35.9 C) 96.6 F (35.9 C)  TempSrc: Core (Comment)     Resp: 20 19 19 19   Height:      Weight:      SpO2: 100%       General: intubated sedated HEENT: ET tube. OG. edematous Neck: supple. JVP up + swan Cor: Oozing around wound vac Distant HS. LVAD hum. Chest tubes draining. RVAD tubes in  place Lungs: mild rhonchi anteriorl Abdomen: soft, nontender, mildly distended. Driveline site ok Extremities: no cyanosis, clubbing, rash, 2+ edema Neuro: intubated sedated   ASSESSMENT:  1. Acute on chronic systolic HF with biventricular HF EF 10%      S/p HM II VAD with TV repair 12/18/12 2. RV failure 3. Cardiogenic shock 4. VT s/p Biotronik ICD 5. Chronic renal failure  6. Severe TR s/p TV repair 7. ABL anemia  PLAN/DISCUSSION:   She is improved with RVAD support but still quite tenuous. Improvement in renal function is promising sign despite overnight course. We are rechecking CBC this am and will likely need more blood products.   Will attempt to wean EPI slowly as BP tolerates. Keep CVP 12-15 range - can go higher if suction events on RVAD. No anticoagulation yet.   Further post-op care per TCTS. I will follow closely.   The patient is critically ill with multiple organ systems failure and requires high complexity decision making for assessment and support, frequent evaluation and titration of therapies, application of advanced monitoring technologies and extensive interpretation of multiple databases.   Critical Care Time devoted to patient care services described in this note is 40 Minutes.  Darcia Lampi,MD 7:16 AM

## 2012-12-19 NOTE — Op Note (Signed)
Kaitlin [name], SPIKE NO.:  [id number]  MEDICAL RECORD NO.:  [id number]  LOCATION:  2S10C                        FACILITY:  [facility]  PHYSICIAN:  Kerin Perna, M.D.  DATE OF BIRTH:  Sep 25, 1945  DATE OF PROCEDURE:  12/18/2012 DATE OF DISCHARGE:                              OPERATIVE REPORT   PREOPERATIVE DIAGNOSIS:  Right ventricular dysfunction following implantable left ventricular assist device placement for idiopathic cardiomyopathy.  POSTOPERATIVE DIAGNOSIS:  Right ventricular dysfunction following implantable left ventricular assist device placement for idiopathic cardiomyopathy.  OPERATION:  Placement of temporary right ventricular assist device (CentriMag pump, cannulation of the right atrium and pulmonary artery).  SURGEON:  Kerin Perna, MD  ASSISTANT SURGEON:  Evelene Croon, MD  ANESTHESIA:  General by Guadalupe Maple, MD, and Maren Beach, MD  CLINICAL NOTE:  The patient is a 67 year old African-American female with earlier on the date of this operation underwent placement of an implantable LVAD for end-stage heart failure, and idiopathic cardiomyopathy.  After being stabilized in the ICU after return from the OR, she developed progressive decline and hemodynamics and evidence of RV dysfunction confirmed by a bedside 2D echo in the ICU which showed global RV dysfunction.  The patient was then prepared for urgent return to the OR for placement of a right ventricular assist device.  The procedure was discussed with the patient's family by the heart failure team.  OPERATIVE PROCEDURE:  The patient was brought directly from the ICU to the operating room while on left ventricular assist device support.  She was carefully placed on the OR table, and general anesthesia was induced.  The previously placed chest tubes were all removed and the chest and abdomen and legs to the knees were prepped and draped as a sterile field.  The previous  sternal incision was opened.  There was a minimal amount of mediastinal clotted and non-clotted blood.  The sternal retractor was placed.  The RV was examined.  He was hypokinetic and somewhat dilated. Opening the chest helped improve the hemodynamics dropped CVP from 20 to 15, but the cardiac output was still low at 2.9 L/minute.  It was decided to proceed with the right ventricular assist device.  First 2 cannulas were tunneled through the chest to be used to cannulate the right atrium and the RV outflow tract.  Heparin was administered. The double pledgeted purse-strings were placed in the right atrial appendage in the RV outflow tract just beneath the pulmonic valve.  Both sites were then used to cannulate the right side for RVAD using a 40- [place] venous cannula and the right atrial appendage and a standard arterial cannula in the RV outflow tract passed through the pulmonic valve.  These were tightened with tourniquets and they were hemostatic.  These cannulas were then connected to the RVAD circuit after removing any air.  Next the patient was slowly placed on RVAD by increasing the RPMs and flow rates from 1 L up to 3.5-4 L.  In doing so, CVP dropped, pulmonary artery pressures increased and LVAD flows also improved dramatically. The appropriate speed for the RVAD and LVAD pumps was then set and confirmed by transesophageal echo findings.  The heparin that  had been administered prior to cannulation was then reversed with protamine.  The mediastinum was irrigated.  The mediastinal and pleural chest tubes were then replaced.  It was elected not to close the chest, but to close the skin and this was performed with a running 3-0 Prolene, over that a wound VAC was placed.  Total cardiopulmonary bypass time was 0 as this procedure was placed off pump. The patient returned to the ICU in the critical, but stable and improved condition.     Kerin Perna, M.D.     PV/MEDQ   D:  12/18/2012  T:  12/19/2012  Job:  [id number]

## 2012-12-19 NOTE — Consult Note (Signed)
WOC consult Note Reason for Consult: Requested to troubleshoot midline sternal vac dressing which keeps alarming, according to staff nurse.  Upon entering the room, vac cannister had 50cc frothy bright red drainage which was occluding the filter and causing the machine to alarm.  Vac dressing had large 10X10cm clotted blood visible underneath drape.  Slowly removed vac drape and dressing to assess for bleeding location.  Midline sternal incision with sutures intact, no open wound.  Well-approximated with mod amt bright red actively bleeding drainage from lower wound which does not slow when pressure applied. Gerarda Gunther, LVAD nurse practitioner, at bedside to assess and discuss with Dr Morton Peters who is currently in the OR.  Dr Laneta Simmers arrived at bedside and sutured bleeder; refer to his progress notes for further description of procedure.  No further bleeding at this time and vac dressing re-applied.  Mepitel contact layer, then one piece of black foam, then cont suction.  Pt sedated during procedure.  New cannister applied and no drainage at this time. Incisional vac dressing can remain in place up to a week, according to [facility] literature. Please re-consult if further assistance is needed.  Thank-you,  Cammie Mcgee MSN, RN, CWOCN, [place], CNS [phone]

## 2012-12-19 NOTE — Progress Notes (Signed)
CRITICAL VALUE ALERT  Critical value received:  Hgb 5.9  Date of notification:  12/19/12  Time of notification:  0703  Critical value read back:yes  Nurse who received alert:  A Haggard RN  MD notified (1st page):  D Bensimhon   Time of first page: 0705am   Responding MD:  D Benimhon at bedside  Time MD responded:  0705am

## 2012-12-20 ENCOUNTER — Encounter (HOSPITAL_COMMUNITY): Payer: Self-pay | Admitting: Cardiothoracic Surgery

## 2012-12-20 ENCOUNTER — Inpatient Hospital Stay (HOSPITAL_COMMUNITY): Payer: Commercial Managed Care - PPO

## 2012-12-20 DIAGNOSIS — Z95818 Presence of other cardiac implants and grafts: Secondary | ICD-10-CM

## 2012-12-20 DIAGNOSIS — I5023 Acute on chronic systolic (congestive) heart failure: Secondary | ICD-10-CM

## 2012-12-20 DIAGNOSIS — I428 Other cardiomyopathies: Secondary | ICD-10-CM

## 2012-12-20 LAB — GLUCOSE, CAPILLARY
Glucose-Capillary: 104 mg/dL — ABNORMAL HIGH (ref 70–99)
Glucose-Capillary: 106 mg/dL — ABNORMAL HIGH (ref 70–99)
Glucose-Capillary: 109 mg/dL — ABNORMAL HIGH (ref 70–99)
Glucose-Capillary: 112 mg/dL — ABNORMAL HIGH (ref 70–99)
Glucose-Capillary: 112 mg/dL — ABNORMAL HIGH (ref 70–99)
Glucose-Capillary: 115 mg/dL — ABNORMAL HIGH (ref 70–99)
Glucose-Capillary: 116 mg/dL — ABNORMAL HIGH (ref 70–99)
Glucose-Capillary: 117 mg/dL — ABNORMAL HIGH (ref 70–99)
Glucose-Capillary: 121 mg/dL — ABNORMAL HIGH (ref 70–99)
Glucose-Capillary: 122 mg/dL — ABNORMAL HIGH (ref 70–99)
Glucose-Capillary: 123 mg/dL — ABNORMAL HIGH (ref 70–99)
Glucose-Capillary: 123 mg/dL — ABNORMAL HIGH (ref 70–99)
Glucose-Capillary: 134 mg/dL — ABNORMAL HIGH (ref 70–99)
Glucose-Capillary: 140 mg/dL — ABNORMAL HIGH (ref 70–99)
Glucose-Capillary: 85 mg/dL (ref 70–99)
Glucose-Capillary: 95 mg/dL (ref 70–99)
Glucose-Capillary: 95 mg/dL (ref 70–99)
Glucose-Capillary: 97 mg/dL (ref 70–99)
Glucose-Capillary: 99 mg/dL (ref 70–99)

## 2012-12-20 LAB — PREPARE FRESH FROZEN PLASMA
Unit division: 0
Unit division: 0
Unit division: 0

## 2012-12-20 LAB — POCT I-STAT 4, (NA,K, GLUC, HGB,HCT)
Glucose, Bld: 215 mg/dL — ABNORMAL HIGH (ref 70–99)
HCT: 33 % — ABNORMAL LOW (ref 36.0–46.0)
Hemoglobin: 11.2 g/dL — ABNORMAL LOW (ref 12.0–15.0)
Potassium: 3.8 mEq/L (ref 3.5–5.1)
Sodium: 140 mEq/L (ref 135–145)

## 2012-12-20 LAB — BASIC METABOLIC PANEL
BUN: 15 mg/dL (ref 6–23)
GFR calc Af Amer: 64 mL/min — ABNORMAL LOW (ref >90)
GFR calc non Af Amer: 55 mL/min — ABNORMAL LOW (ref >90)
Potassium: 4.1 mEq/L (ref 3.5–5.1)

## 2012-12-20 LAB — CARBOXYHEMOGLOBIN
Carboxyhemoglobin: 1.3 % (ref 0.5–1.5)
Carboxyhemoglobin: 1.5 % (ref 0.5–1.5)
Carboxyhemoglobin: 1.5 % (ref 0.5–1.5)
Carboxyhemoglobin: 1.6 % — ABNORMAL HIGH (ref 0.5–1.5)
Methemoglobin: 1.3 % (ref 0.0–1.5)
Methemoglobin: 2 % — ABNORMAL HIGH (ref 0.0–1.5)
Methemoglobin: 2.2 % — ABNORMAL HIGH (ref 0.0–1.5)
Methemoglobin: 2.2 % — ABNORMAL HIGH (ref 0.0–1.5)
O2 Saturation: 77.4 %
O2 Saturation: 78.8 %
O2 Saturation: 71.7 %
O2 Saturation: 66.8 %
Total hemoglobin: 8.6 g/dL — ABNORMAL LOW (ref 12.0–16.0)
Total hemoglobin: 8.2 g/dL — ABNORMAL LOW (ref 12.0–16.0)
Total hemoglobin: 8.4 g/dL — ABNORMAL LOW (ref 12.0–16.0)
Total hemoglobin: 7.5 g/dL — ABNORMAL LOW (ref 12.0–16.0)

## 2012-12-20 LAB — CBC
HCT: 24.3 % — ABNORMAL LOW (ref 36.0–46.0)
HCT: 22.9 % — ABNORMAL LOW (ref 36.0–46.0)
Hemoglobin: 8.5 g/dL — ABNORMAL LOW (ref 12.0–15.0)
Hemoglobin: 8 g/dL — ABNORMAL LOW (ref 12.0–15.0)
MCH: 29 pg (ref 26.0–34.0)
MCH: 29 pg (ref 26.0–34.0)
MCHC: 35 g/dL (ref 30.0–36.0)
MCHC: 34.9 g/dL (ref 30.0–36.0)
MCV: 82.9 fL (ref 78.0–100.0)
MCV: 83 fL (ref 78.0–100.0)
Platelets: 94 10*3/uL — ABNORMAL LOW (ref 150–400)
Platelets: 92 10*3/uL — ABNORMAL LOW (ref 150–400)
RBC: 2.93 MIL/uL — ABNORMAL LOW (ref 3.87–5.11)
RBC: 2.76 MIL/uL — ABNORMAL LOW (ref 3.87–5.11)
RDW: 16.7 % — ABNORMAL HIGH (ref 11.5–15.5)
RDW: 16.9 % — ABNORMAL HIGH (ref 11.5–15.5)
WBC: 12.1 10*3/uL — ABNORMAL HIGH (ref 4.0–10.5)
WBC: 13.1 10*3/uL — ABNORMAL HIGH (ref 4.0–10.5)

## 2012-12-20 LAB — PREPARE CRYOPRECIPITATE
Unit division: 0
Unit division: 0
Unit division: 0
Unit division: 0
Unit division: 0
Unit division: 0

## 2012-12-20 LAB — POCT I-STAT 3, ART BLOOD GAS (G3+)
Acid-base deficit: 2 mmol/L (ref 0.0–2.0)
Bicarbonate: 24.3 mEq/L — ABNORMAL HIGH (ref 20.0–24.0)
O2 Saturation: 100 %
TCO2: 26 mmol/L (ref 0–100)
pCO2 arterial: 44.9 mmHg (ref 35.0–45.0)
pCO2 arterial: 38.2 mmHg (ref 35.0–45.0)
pH, Arterial: 7.365 (ref 7.350–7.450)
pH, Arterial: 7.343 — ABNORMAL LOW (ref 7.350–7.450)
pO2, Arterial: 191 mmHg — ABNORMAL HIGH (ref 80.0–100.0)
pO2, Arterial: 176 mmHg — ABNORMAL HIGH (ref 80.0–100.0)

## 2012-12-20 LAB — HEPATIC FUNCTION PANEL
ALT: 23 U/L (ref 0–35)
AST: 92 U/L — ABNORMAL HIGH (ref 0–37)
Albumin: 2.8 g/dL — ABNORMAL LOW (ref 3.5–5.2)
Alkaline Phosphatase: 49 U/L (ref 39–117)
Bilirubin, Direct: 2.7 mg/dL — ABNORMAL HIGH (ref 0.0–0.3)
Indirect Bilirubin: 2.1 mg/dL — ABNORMAL HIGH (ref 0.3–0.9)
Total Bilirubin: 4.8 mg/dL — ABNORMAL HIGH (ref 0.3–1.2)
Total Protein: 4.6 g/dL — ABNORMAL LOW (ref 6.0–8.3)

## 2012-12-20 LAB — CBC WITH DIFFERENTIAL/PLATELET
Basophils Absolute: 0 10*3/uL (ref 0.0–0.1)
Basophils Relative: 0 % (ref 0–1)
Eosinophils Absolute: 0.1 10*3/uL (ref 0.0–0.7)
Eosinophils Relative: 1 % (ref 0–5)
HCT: 25 % — ABNORMAL LOW (ref 36.0–46.0)
Hemoglobin: 8.8 g/dL — ABNORMAL LOW (ref 12.0–15.0)
Lymphocytes Relative: 10 % — ABNORMAL LOW (ref 12–46)
Lymphs Abs: 1.1 10*3/uL (ref 0.7–4.0)
MCH: 28.9 pg (ref 26.0–34.0)
MCHC: 35.2 g/dL (ref 30.0–36.0)
MCV: 82.2 fL (ref 78.0–100.0)
Monocytes Absolute: 0.9 10*3/uL (ref 0.1–1.0)
Monocytes Relative: 8 % (ref 3–12)
Neutro Abs: 9 10*3/uL — ABNORMAL HIGH (ref 1.7–7.7)
Neutrophils Relative %: 81 % — ABNORMAL HIGH (ref 43–77)
Platelets: 104 10*3/uL — ABNORMAL LOW (ref 150–400)
RBC: 3.04 MIL/uL — ABNORMAL LOW (ref 3.87–5.11)
RDW: 16.4 % — ABNORMAL HIGH (ref 11.5–15.5)
WBC: 11.1 10*3/uL — ABNORMAL HIGH (ref 4.0–10.5)

## 2012-12-20 LAB — APTT
aPTT: 90 seconds — ABNORMAL HIGH (ref 24–37)
aPTT: 119 seconds — ABNORMAL HIGH (ref 24–37)
aPTT: 95 seconds — ABNORMAL HIGH (ref 24–37)

## 2012-12-20 LAB — POCT ACTIVATED CLOTTING TIME
Activated Clotting Time: 160 seconds
Activated Clotting Time: 170 seconds
Activated Clotting Time: 170 seconds
Activated Clotting Time: 165 seconds
Activated Clotting Time: 160 seconds
Activated Clotting Time: 170 seconds
Activated Clotting Time: 155 seconds

## 2012-12-20 LAB — HEPARIN LEVEL (UNFRACTIONATED)
Heparin Unfractionated: 0.19 IU/mL — ABNORMAL LOW (ref 0.30–0.70)
Heparin Unfractionated: 0.28 IU/mL — ABNORMAL LOW (ref 0.30–0.70)
Heparin Unfractionated: 0.18 IU/mL — ABNORMAL LOW (ref 0.30–0.70)

## 2012-12-20 LAB — PREPARE PLATELET PHERESIS
Unit division: 0
Unit division: 0
Unit division: 0

## 2012-12-20 LAB — CALCIUM, IONIZED: Calcium, Ion: 1.18 mmol/L (ref 1.13–1.30)

## 2012-12-20 LAB — PHOSPHORUS: Phosphorus: 3.5 mg/dL (ref 2.3–4.6)

## 2012-12-20 LAB — PROTIME-INR
INR: 1.37 (ref 0.00–1.49)
Prothrombin Time: 16.5 seconds — ABNORMAL HIGH (ref 11.6–15.2)

## 2012-12-20 LAB — MAGNESIUM: Magnesium: 2.2 mg/dL (ref 1.5–2.5)

## 2012-12-20 LAB — LACTATE DEHYDROGENASE: LDH: 429 U/L — ABNORMAL HIGH (ref 94–250)

## 2012-12-20 LAB — PREPARE RBC (CROSSMATCH)

## 2012-12-20 MED ORDER — POTASSIUM CHLORIDE 20 MEQ/15ML (10%) PO LIQD
20.0000 meq | Freq: Every day | ORAL | Status: DC
  Administered 2012-12-20: 20 meq
  Filled 2012-12-20: qty 15

## 2012-12-20 MED ORDER — FUROSEMIDE 10 MG/ML IJ SOLN
3.0000 mg/h | INTRAVENOUS | Status: DC
  Administered 2012-12-20: 4 mg/h via INTRAVENOUS
  Filled 2012-12-20: qty 25

## 2012-12-20 MED ORDER — VANCOMYCIN HCL IN DEXTROSE 1-5 GM/200ML-% IV SOLN
1000.0000 mg | INTRAVENOUS | Status: DC
  Administered 2012-12-20 – 2012-12-21 (×2): 1000 mg via INTRAVENOUS
  Filled 2012-12-20 (×3): qty 200

## 2012-12-20 MED ORDER — DIGOXIN 0.05 MG/ML PO SOLN
0.1250 mg | Freq: Every day | ORAL | Status: DC
  Administered 2012-12-20: 0.125 mg
  Filled 2012-12-20: qty 2.5

## 2012-12-20 MED ORDER — DEXTROSE 5 % IV SOLN
1.0000 g | Freq: Three times a day (TID) | INTRAVENOUS | Status: DC
  Administered 2012-12-20 – 2012-12-28 (×23): 1 g via INTRAVENOUS
  Filled 2012-12-20 (×25): qty 1

## 2012-12-20 MED ORDER — CEFTAZIDIME 1 G IJ SOLR
1.0000 g | Freq: Three times a day (TID) | INTRAMUSCULAR | Status: DC
  Filled 2012-12-20: qty 1

## 2012-12-20 MED ORDER — DEXTROSE 5 % IV SOLN
1.5000 g | Freq: Two times a day (BID) | INTRAVENOUS | Status: DC
  Administered 2012-12-20: 1.5 g via INTRAVENOUS
  Filled 2012-12-20 (×3): qty 1.5

## 2012-12-20 MED ORDER — ALBUMIN HUMAN 5 % IV SOLN
INTRAVENOUS | Status: AC
  Administered 2012-12-20: 12.5 g
  Filled 2012-12-20: qty 250

## 2012-12-20 MED ORDER — DOCUSATE SODIUM 50 MG/5ML PO LIQD
200.0000 mg | Freq: Every day | ORAL | Status: DC
  Administered 2012-12-20 – 2012-12-28 (×5): 200 mg
  Filled 2012-12-20 (×12): qty 20

## 2012-12-20 NOTE — Progress Notes (Signed)
. HeartMate 2 Rounding Note  Subjective:   Postop day 2 HeartMate 2 implantation with subsequent emergency center mag RVAD because of the RV dysfunction. Patient with stable hemodynamics on BiVAD support. Responsive but comfortable with IV sedation. Mean flows approximately 4 L on each device. No PI events on LVAD. Good urine output non-Lasix drip 4 mg per hour. Heparin started with initial PTT of 90-110. No increased chest tube output with heparin but hemoglobin dropped 1 g.  Plan is to rest the RV with low CVP and remove fluid with gentle diuresis. Continue antibiotics chest is open except for skin closure.   LVAD INTERROGATION:  HeartMate II LVAD:  Flow 4.1 liters/min, speed 8400 power 4.2, PI 5.0.   Objective:    Vital Signs:   Temp:  [98.8 F (37.1 C)-99.9 F (37.7 C)] 99.9 F (37.7 C) (08/20 1815) Pulse Rate:  [66-88] 88 (08/20 1635) Resp:  [0-20] 14 (08/20 1815) BP: (73-81)/(66) 81/66 mmHg (08/19 2314) SpO2:  [100 %] 100 % (08/20 1635) Arterial Line BP: (77-106)/(59-79) 95/71 mmHg (08/20 0930) FiO2 (%):  [40 %-80 %] 40 % (08/20 1800) Weight:  [194 lb 4.8 oz (88.134 kg)] 194 lb 4.8 oz (88.134 kg) (08/20 0645) Last BM Date: 12/15/12 Mean arterial Pressure 84  Intake/Output:   Intake/Output Summary (Last 24 hours) at 12/20/12 1839 Last data filed at 12/20/12 1800  Gross per 24 hour  Intake 3994.7 ml  Output   3585 ml  Net  409.7 ml     Physical Exam: General: Intubated in ICU on right ventricular assist device HEENT: normal Neck: supple. JVP . Carotids 2+ bilat; no bruits. No lymphadenopathy or thryomegaly appreciated. Cor: Mechanical heart sounds with LVAD hum present. Lungs: clear Abdomen: soft, nontender, nondistended. No hepatosplenomegaly. No bruits or masses. Good bowel sounds. Extremities: no cyanosis, clubbing, rash, edema Neuro: moves all 4 extremities but now is on sedation protocol while on ventilator  Telemetry: A paced 90 per minute  Labs: Basic  Metabolic Panel:  Recent Labs Lab 12/18/12 0427  12/18/12 1612  12/18/12 1744  12/18/12 2250 12/18/12 2256 12/19/12 0311 12/19/12 1004 12/19/12 1545 12/20/12 0400  NA 138  < > 139  < > 142  < >  --  144 141 142 139 135  K 4.1  < > 3.0*  < > 3.3*  < >  --  2.9* 4.3 4.6 4.4 4.1  CL 101  --  105  < > 105  --   --   --  110 104 104 102  CO2 27  --  22  --   --   --   --   --  24  --  25 23  GLUCOSE 100*  < > 166*  < > 137*  < >  --  184* 134* 103* 103* 114*  BUN 24*  --  18  < > 17  --   --   --  14 12 13 15   CREATININE 1.59*  --  1.26*  < > 1.50*  --  1.20*  --  1.09 1.20* 1.12* 1.04  CALCIUM 10.1  --  8.4  --   --   --   --   --  7.3*  --  8.2* 8.4  MG  --   --  2.2  --   --   --  2.7*  --  3.1*  --  2.4 2.2  PHOS  --   --   --   --   --   --   --   --  3.2  --   --  3.5  < > = values in this interval not displayed.  Liver Function Tests:  Recent Labs Lab 12/17/12 2135 12/19/12 0311 12/20/12 0400  AST 33 85* 92*  ALT 44* 17 23  ALKPHOS 83  --  49  BILITOT 0.5 3.1* 4.8*  PROT 6.8  --  4.6*  ALBUMIN 3.7  --  2.8*   No results found for this basename: LIPASE, AMYLASE,  in the last 168 hours No results found for this basename: AMMONIA,  in the last 168 hours  CBC:  Recent Labs Lab 12/19/12 0311  12/19/12 0855 12/19/12 1004 12/19/12 1130 12/19/12 1545 12/20/12 0400 12/20/12 1605  WBC 6.9  < > 6.6  --  6.4 8.5 11.1* 12.1*  NEUTROABS 5.4  --   --   --   --   --  9.0*  --   HGB 6.5*  < > 5.3* 5.8* 8.3* 8.2* 8.8* 8.5*  HCT 18.3*  < > 15.0* 17.0* 23.6* 23.2* 25.0* 24.3*  MCV 85.5  < > 84.7  --  82.5 81.1 82.2 82.9  PLT 103*  < > 143*  --  111* 108* 104* 94*  < > = values in this interval not displayed.  INR:  Recent Labs Lab 12/18/12 1613 12/19/12 0311 12/19/12 0812 12/19/12 1545 12/20/12 0400  INR 1.43 1.61* 1.42 1.30 1.37    Other results:  EKG:   Imaging: Dg Chest Port 1 View  12/20/2012   CLINICAL DATA:  Postop.  EXAM: PORTABLE CHEST - 1 VIEW   COMPARISON:  12/19/2012  FINDINGS: Support devices are stable. No pneumothorax. Mild cardiomegaly. Left base atelectasis, similar to prior study. No visible effusions.  IMPRESSION: No significant change since prior study.   Electronically Signed   By: Charlett Nose   On: 12/20/2012 08:02   Dg Chest Port 1 View  12/19/2012   **ADDENDUM** CREATED: 12/19/2012 07:41:55  The following is added to the original report, which was chest dictated:  Difficult to exclude a small right apical pneumothorax. Continued attention on follow-up exams is warranted.  **END ADDENDUM** SIGNED BY: Reyes Ivan, M.D.  12/19/2012   *RADIOLOGY REPORT*  Clinical Data: Insertion of an implantable right ventricular assist device.  Right-sided heart failure.  PORTABLE CHEST - 1 VIEW  Comparison: 12/18/2012.  Findings: Endotracheal tube terminates approximately 9 mm above the carina.  Nasogastric tube is followed into the stomach with the tip projecting beyond the inferior boundary of the image.  Right IJ central line tip is obscured by the right IJ Swan-Ganz catheter. Right IJ Swan-Ganz catheter tip projects in the proximal right pulmonary artery.  Left subclavian AICD lead tip projects over the right ventricle.  Bilateral chest tubes are in place.  Interval insertion of a right ventricular assist device with tips projecting over right atrial appendage and right ventricular outflow tract. Left ventricular assist device projects over the cardiac apex region. Sternal wires have been removed in the interval.  Heart size stable.  Lungs are low in volume with scattered atelectasis.  No definite pleural fluid.  IMPRESSION:  1.  Endotracheal tube is low lying.  Retracting approximately 2 cm would better position the tip above the carina. These results will be called to the ordering clinician or representative by the Radiologist Assistant, and communication documented in the PACS Dashboard. 2.  Interval insertion of a right ventricular assist  device with a cannula tips projecting over the right atrial appendage and right ventricular  outflow tract. 3.  Scattered atelectasis bilaterally.   Original Report Authenticated By: Leanna Battles, M.D.     Medications:     Scheduled Medications: . acetaminophen (TYLENOL) oral liquid 160 mg/5 mL  650 mg Per Tube Once   Or  . acetaminophen  650 mg Rectal Once  . antiseptic oral rinse  15 mL Mouth Rinse QID  . aspirin EC  325 mg Oral Daily   Or  . aspirin  324 mg Per Tube Daily   Or  . aspirin  300 mg Rectal Daily  . atorvastatin  20 mg Oral q1800  . bisacodyl  10 mg Oral Daily   Or  . bisacodyl  10 mg Rectal Daily  . cefUROXime (ZINACEF)  IV  1.5 g Intravenous Q12H  . chlorhexidine  15 mL Mouth Rinse BID  . docusate  200 mg Per Tube Daily  . insulin regular  0-10 Units Intravenous TID WC  . magnesium sulfate 1 - 4 g bolus IVPB  2 g Intravenous Once  . mupirocin ointment  1 application Topical BID  . pantoprazole (PROTONIX) IV  40 mg Intravenous Q12H  . polyethylene glycol  17 g Oral Daily  . potassium chloride  20 mEq Per Tube Daily  . sodium bicarbonate  50 mEq Intravenous Once  . vancomycin  1,000 mg Intravenous Q24H    Infusions: . sodium chloride 20 mL/hr at 12/19/12 1700  . sodium chloride 10 mL/hr at 12/15/12 1900  . sodium chloride    . amiodarone (NEXTERONE PREMIX) 360 mg/200 mL dextrose 15 mg/hr (12/20/12 1800)  . dexmedetomidine 0.3 mcg/kg/hr (12/20/12 1800)  . DOBUTamine 4 mcg/kg/min (12/20/12 1800)  . DOPamine 3 mcg/kg/min (12/20/12 1823)  . EPINEPHrine infusion 1.5 mcg/min (12/20/12 1830)  . fentaNYL infusion INTRAVENOUS 75 mcg/hr (12/20/12 1800)  . furosemide (LASIX) infusion 4 mg/hr (12/20/12 1800)  . heparin    . heparin 700 Units/hr (12/20/12 1800)  . insulin (NOVOLIN-R) infusion 1.6 Units/hr (12/20/12 1822)  . lactated ringers 20 mL/hr at 12/19/12 1900  . midazolam (VERSED) infusion 2 mg/hr (12/20/12 1800)  . nitroGLYCERIN Stopped (12/18/12 1705)  .  norepinephrine (LEVOPHED) Adult infusion 4 mcg/min (12/20/12 1800)  . phenylephrine (NEO-SYNEPHRINE) Adult infusion 20 mcg/min (12/20/12 1800)  . 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 Sputum culture pending and antibiotics continued. Patient would benefit from nutrition however with absent bowel sounds and inability to use TPN with elevated bilirubin and fluid overload will wait and follow. Patient's family provided with clinical update twice today regarding plan of care, patient's condition, and hope for removal are bad after 2-3 more days of support.    I reviewed the LVAD parameters from today, and compared the results to the patient's prior recorded data.  No programming changes were made.  The LVAD is functioning within specified parameters.  The patient performs LVAD self-test daily.  LVAD interrogation was negative for any significant power changes, alarms or PI events/speed drops.  LVAD equipment check completed and is in good working order.  Back-up equipment present.   LVAD education done  on emergency procedures and precautions and reviewed exit site care.  Length of Stay: 12  Kaitlin [name],Kaitlin [name] 12/20/2012, 6:39 PM

## 2012-12-20 NOTE — Progress Notes (Signed)
NUTRITION FOLLOW-UP  DOCUMENTATION CODES Per approved criteria  -Not Applicable   INTERVENTION: If prolonged intubation expected, recommend initiation of nutrition support to maximize healing. If enteral nutrition desired, recommend initiation of Vital 1.5 via enteral feeding tube at 20 ml/hr, advance by 10 ml q 4 hours, to goal of 30 ml/hr. Add 30 ml Prostat liquid protein via tube five times daily. Goal regimen will provide: 1580 kcal, 124 grams protein, 550 ml free water. RD to continue to follow nutrition care plan.  NUTRITION DIAGNOSIS: Inadequate oral intake now related to inability to eat as evidenced by NPO status. Ongoing.  Goal: Initiate nutrition support within 24-48 hours of intubation. Unmet. Intake to meet >90% of estimated nutrition needs. Unmet.  Monitor:  Vent settings, labs, weight trend, post-op healing and recovery, initiation of nutrition support  ASSESSMENT: Underwent the following 8/18: INSERTION OF IMPLANTABLE LEFT VENTRICULAR ASSIST DEVICE  INTRAOPERATIVE TRANSESOPHAGEAL ECHOCARDIOGRAM  TRICUSPID VALVE REPAIR  INSERTION OF IMPLANTABLE RIGHT VENTRICULAR ASSIST DEVICE   Pt found to have R ventricular dysfunction s/p LVAD implantation, required trip to OR later that evening for placement of RVAD. Pt now with LVAD and RVAD.  Current wt is up 30 lb - given 1 dose of IV lasix yesterday with good UOP, per cardiology.  Remains intubated on ventilator support.  MV: 6.7 L/min Temp:Temp (24hrs), Avg:99.1 F (37.3 C), Min:98.4 F (36.9 C), Max:99.9 F (37.7 C)  Propofol: none  Height: Ht Readings from Last 1 Encounters:  12/18/12 5' 2.99" (1.6 m)    Weight: Wt Readings from Last 1 Encounters:  12/20/12 194 lb 4.8 oz (88.134 kg)  Admission wt: 165 lb Current wt is up 30 lb.  BMI:  Body mass index is 29.3 kg/(m^2) - using admission weight.  Estimated Nutritional Needs: Kcal: 1505 Protein: 112 - 131 gm Fluid: 1.5 - 2 L  Skin:  Abdomen  incision Chest incision - with wound VAC  Diet Order: NPO  EDUCATION NEEDS: -Education needs addressed. Provided many handouts on heart healthy eating, including label reading tips per patient request on 8/15.   Intake/Output Summary (Last 24 hours) at 12/20/12 1004 Last data filed at 12/20/12 0900  Gross per 24 hour  Intake 4694.73 ml  Output   4106 ml  Net 588.73 ml    Last BM: 8/15  Labs:   Recent Labs Lab 12/19/12 0311 12/19/12 1004 12/19/12 1545 12/20/12 0400  NA 141 142 139 135  K 4.3 4.6 4.4 4.1  CL 110 104 104 102  CO2 24  --  25 23  BUN 14 12 13 15   CREATININE 1.09 1.20* 1.12* 1.04  CALCIUM 7.3*  --  8.2* 8.4  MG 3.1*  --  2.4 2.2  PHOS 3.2  --   --  3.5  GLUCOSE 134* 103* 103* 114*    CBG (last 3)   Recent Labs  12/19/12 1956 12/19/12 2118 12/19/12 2320  GLUCAP 140* 134* 111*    Scheduled Meds: . acetaminophen (TYLENOL) oral liquid 160 mg/5 mL  650 mg Per Tube Once   Or  . acetaminophen  650 mg Rectal Once  . antiseptic oral rinse  15 mL Mouth Rinse QID  . aspirin EC  325 mg Oral Daily   Or  . aspirin  324 mg Per Tube Daily   Or  . aspirin  300 mg Rectal Daily  . atorvastatin  20 mg Oral q1800  . bisacodyl  10 mg Oral Daily   Or  . bisacodyl  10  mg Rectal Daily  . cefUROXime (ZINACEF)  IV  1.5 g Intravenous Q12H  . chlorhexidine  15 mL Mouth Rinse BID  . digoxin  0.125 mg Per Tube Daily  . docusate  200 mg Per Tube Daily  . insulin regular  0-10 Units Intravenous TID WC  . magnesium sulfate 1 - 4 g bolus IVPB  2 g Intravenous Once  . mupirocin ointment  1 application Topical BID  . pantoprazole (PROTONIX) IV  40 mg Intravenous Q12H  . polyethylene glycol  17 g Oral Daily  . potassium chloride  20 mEq Per Tube Daily  . sodium bicarbonate  50 mEq Intravenous Once  . vancomycin  1,000 mg Intravenous Q24H    Continuous Infusions: . sodium chloride 20 mL/hr at 12/19/12 1700  . sodium chloride 10 mL/hr at 12/15/12 1900  . sodium  chloride    . amiodarone (NEXTERONE PREMIX) 360 mg/200 mL dextrose 15 mg/hr (12/20/12 1000)  . dexmedetomidine 0.3 mcg/kg/hr (12/20/12 1000)  . DOBUTamine 4 mcg/kg/min (12/20/12 1000)  . DOPamine 3 mcg/kg/min (12/20/12 1000)  . EPINEPHrine infusion 2.5 mcg/min (12/20/12 1000)  . fentaNYL infusion INTRAVENOUS 75 mcg/hr (12/20/12 1000)  . furosemide (LASIX) infusion 4 mg/hr (12/20/12 1000)  . heparin    . heparin    . heparin    . heparin 800 Units/hr (12/20/12 1000)  . insulin (NOVOLIN-R) infusion 1.6 mL/hr at 12/20/12 0900  . lactated ringers 20 mL/hr at 12/19/12 1900  . midazolam (VERSED) infusion 2 mg/hr (12/20/12 1000)  . nitroGLYCERIN Stopped (12/18/12 1705)  . norepinephrine (LEVOPHED) Adult infusion 4 mcg/min (12/20/12 1000)  . phenylephrine (NEO-SYNEPHRINE) Adult infusion 20 mcg/min (12/20/12 1000)  . vasopressin (PITRESSIN) infusion - *FOR SHOCK*       Jarold Motto MS, RD, LDN Pager: [phone] After-hours pager: [phone]

## 2012-12-20 NOTE — Progress Notes (Signed)
OT Cancellation Note  Patient Details Name: Kaitlin [name] MRN: [id number] DOB: 11/18/1945   Cancelled Treatment:    Reason Eval/Treat Not Completed: Patient not medically ready OT signing off at this time. Please reorder when pt able to participate with OT. Thank you  [facility] E[facility] Ribaudo, OTR/L  [phone] 12/20/2012 12/20/2012, 8:41 AM

## 2012-12-20 NOTE — Progress Notes (Signed)
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 EF 15-25% with mild to moderate RV dysfunction  Underwent HM II LVAD implant along with TV repair on 8/18. Once she returned to the ICU she had difficulties with RV and CVP rose to 20-25. She was taken back to the OR for RVAD support.  Yesterday received a total of 3 PRBCs for bleeding. Hgb is stable 8.8. Remains intubated and sedated. Weight is up about 30 lbs, 1 dose of IV lasix yesterday with good UOP. Still on Dopa 3 mcg, Amio 30 mg, Dobutamine 4 mcg, Levophed 4 mcg, Neo 20 mcg. She is on heparin as well for RVAD.  PA 27/18 (22) CVP 13 Co-ox 78  RVAD 2600 RPM and 4.1 L  LVAD INTERROGATION:  HeartMate II LVAD:  Flow 3.5 liters/min, speed 8400, power 3.9, PI 5.5.  No PI events   Objective:    Vital Signs:   Temp:  [96.6 F (35.9 C)-99.9 F (37.7 C)] 98.8 F (37.1 C) (08/20 0700) Pulse Rate:  [66-88] 82 (08/20 0333) Resp:  [0-24] 19 (08/20 0700) BP: (73-96)/(66-73) 81/66 mmHg (08/19 2314) SpO2:  [100 %] 100 % (08/20 0700) Arterial Line BP: (71-115)/(66-82) 91/68 mmHg (08/20 0700) FiO2 (%):  [50 %] 50 % (08/20 0700) Weight:  [194 lb 4.8 oz (88.134 kg)] 194 lb 4.8 oz (88.134 kg) (08/20 0645) Last BM Date: 12/15/12 Mean arterial Pressure 60-70s  Intake/Output:   Intake/Output Summary (Last 24 hours) at 12/20/12 0717 Last data filed at 12/20/12 0700  Gross per 24 hour  Intake 6278.13 ml  Output   6666 ml  Net -387.87 ml     Physical Exam: General: Intubated and sedated  HEENT: normal Neck: supple. CVP 13 . Carotids 2+ bilat; no bruits. No lymphadenopathy or thryomegaly appreciated. Swan in place R IJ Cor: Mechanical heart sounds with LVAD hum present.; CT in place RVAD tubes in place Lungs: CTA Abdomen: soft, nontender, +++distended. Hypoactive BS Driveline: C/D/I; securement device intact  Extremities: no cyanosis,  clubbing, rash, 2-3+ edema Neuro: alert & orientedx3, cranial nerves grossly intact. moves all 4 extremities w/o difficulty. Affect pleasant  Telemetry: AV paced 70s  Labs: Basic Metabolic Panel:  Recent Labs Lab 12/18/12 0427  12/18/12 1612  12/18/12 1744  12/18/12 2250 12/18/12 2256 12/19/12 0311 12/19/12 1004 12/19/12 1545 12/20/12 0400  NA 138  < > 139  < > 142  < >  --  144 141 142 139 135  K 4.1  < > 3.0*  < > 3.3*  < >  --  2.9* 4.3 4.6 4.4 4.1  CL 101  --  105  < > 105  --   --   --  110 104 104 102  CO2 27  --  22  --   --   --   --   --  24  --  25 23  GLUCOSE 100*  < > 166*  < > 137*  < >  --  184* 134* 103* 103* 114*  BUN 24*  --  18  < > 17  --   --   --  14 12 13 15   CREATININE 1.59*  --  1.26*  < > 1.50*  --  1.20*  --  1.09 1.20* 1.12* 1.04  CALCIUM 10.1  --  8.4  --   --   --   --   --  7.3*  --  8.2* 8.4  MG  --   --  2.2  --   --   --  2.7*  --  3.1*  --  2.4 2.2  PHOS  --   --   --   --   --   --   --   --  3.2  --   --  3.5  < > = values in this interval not displayed.  Liver Function Tests:  Recent Labs Lab 12/17/12 2135 12/19/12 0311 12/20/12 0400  AST 33 85* 92*  ALT 44* 17 23  ALKPHOS 83  --  49  BILITOT 0.5 3.1* 4.8*  PROT 6.8  --  4.6*  ALBUMIN 3.7  --  2.8*   No results found for this basename: LIPASE, AMYLASE,  in the last 168 hours No results found for this basename: AMMONIA,  in the last 168 hours  CBC:  Recent Labs Lab 12/19/12 0311  12/19/12 0715 12/19/12 0812 12/19/12 0855 12/19/12 1004 12/19/12 1130 12/19/12 1545 12/20/12 0400  WBC 6.9  --  6.0  --  6.6  --  6.4 8.5 11.1*  NEUTROABS 5.4  --   --   --   --   --   --   --  9.0*  HGB 6.5*  < > 6.1*  --  5.3* 5.8* 8.3* 8.2* 8.8*  HCT 18.3*  < > 17.4*  --  15.0* 17.0* 23.6* 23.2* 25.0*  MCV 85.5  --  84.5  --  84.7  --  82.5 81.1 82.2  PLT 103*  --  175 139* 143*  --  111* 108* 104*  < > = values in this interval not displayed.  INR:  Recent Labs Lab 12/18/12 1613  12/19/12 0311 12/19/12 0812 12/19/12 1545 12/20/12 0400  INR 1.43 1.61* 1.42 1.30 1.37    Imaging: Dg Chest Port 1 View  12/19/2012   **ADDENDUM** CREATED: 12/19/2012 07:41:55  The following is added to the original report, which was chest dictated:  Difficult to exclude a small right apical pneumothorax. Continued attention on follow-up exams is warranted.  **END ADDENDUM** SIGNED BY: Reyes Ivan, M.D.  12/19/2012   *RADIOLOGY REPORT*  Clinical Data: Insertion of an implantable right ventricular assist device.  Right-sided heart failure.  PORTABLE CHEST - 1 VIEW  Comparison: 12/18/2012.  Findings: Endotracheal tube terminates approximately 9 mm above the carina.  Nasogastric tube is followed into the stomach with the tip projecting beyond the inferior boundary of the image.  Right IJ central line tip is obscured by the right IJ Swan-Ganz catheter. Right IJ Swan-Ganz catheter tip projects in the proximal right pulmonary artery.  Left subclavian AICD lead tip projects over the right ventricle.  Bilateral chest tubes are in place.  Interval insertion of a right ventricular assist device with tips projecting over right atrial appendage and right ventricular outflow tract. Left ventricular assist device projects over the cardiac apex region. Sternal wires have been removed in the interval.  Heart size stable.  Lungs are low in volume with scattered atelectasis.  No definite pleural fluid.  IMPRESSION:  1.  Endotracheal tube is low lying.  Retracting approximately 2 cm would better position the tip above the carina. These results will be called to the ordering clinician or representative by the Radiologist Assistant, and communication documented in the PACS Dashboard. 2.  Interval insertion of a right ventricular assist device with a cannula tips projecting over the right atrial appendage and  right ventricular outflow tract. 3.  Scattered atelectasis bilaterally.   Original Report Authenticated By:  Leanna Battles, M.D.   Dg Chest Port 1 View  12/18/2012   *RADIOLOGY REPORT*  Clinical Data: Interval placement of ventricular assist device.  PORTABLE CHEST - 1 VIEW  Comparison: 12/17/2012  Findings: That the endotracheal tube tip is above the carina. There is a Swan-Ganz catheter with tip in the main pulmonary artery.  A left chest wall ICD is noted with lead in the right ventricle.  Bilateral chest tubes are in place.  No pneumothorax identified.  Ventricular assist device is noted in the projection of the left lower hemithorax and left upper quadrant of the abdomen. Atelectasis is identified within both mid lungs and both lung bases.  IMPRESSION:  1. Support apparatus as described above.  2.  No complicating features identified.  No pneumothorax noted.   Original Report Authenticated By: Signa Kell, M.D.      Medications:     Scheduled Medications: . acetaminophen (TYLENOL) oral liquid 160 mg/5 mL  650 mg Per Tube Once   Or  . acetaminophen  650 mg Rectal Once  . antiseptic oral rinse  15 mL Mouth Rinse QID  . aspirin EC  325 mg Oral Daily   Or  . aspirin  324 mg Per Tube Daily   Or  . aspirin  300 mg Rectal Daily  . atorvastatin  20 mg Oral q1800  . bisacodyl  10 mg Oral Daily   Or  . bisacodyl  10 mg Rectal Daily  . cefUROXime (ZINACEF)  IV  1.5 g Intravenous Q12H  . chlorhexidine  15 mL Mouth Rinse BID  . digoxin  0.125 mg Oral Daily  . docusate sodium  200 mg Oral Daily  . insulin regular  0-10 Units Intravenous TID WC  . magnesium sulfate 1 - 4 g bolus IVPB  2 g Intravenous Once  . mupirocin ointment  1 application Topical BID  . pantoprazole (PROTONIX) IV  40 mg Intravenous Q12H  . polyethylene glycol  17 g Oral Daily  . potassium chloride  20 mEq Oral Daily  . sodium bicarbonate  50 mEq Intravenous Once  . vancomycin  1,000 mg Intravenous Q24H     Infusions: . sodium chloride 20 mL/hr at 12/19/12 1700  . sodium chloride 10 mL/hr at 12/15/12 1900  . sodium  chloride    . amiodarone (NEXTERONE PREMIX) 360 mg/200 mL dextrose 30 mg/hr (12/20/12 0700)  . dexmedetomidine 0.3 mcg/kg/hr (12/20/12 0700)  . DOBUTamine 4 mcg/kg/min (12/20/12 0700)  . DOPamine 3 mcg/kg/min (12/20/12 0700)  . EPINEPHrine infusion 2.5 mcg/min (12/20/12 0700)  . fentaNYL infusion INTRAVENOUS 75 mcg/hr (12/20/12 0700)  . heparin    . heparin    . heparin    . heparin 800 Units/hr (12/20/12 0700)  . insulin (NOVOLIN-R) infusion 1.9 Units/hr (12/20/12 0700)  . lactated ringers 20 mL/hr at 12/19/12 1900  . midazolam (VERSED) infusion 2 mg/hr (12/20/12 0700)  . nitroGLYCERIN Stopped (12/18/12 1705)  . norepinephrine (LEVOPHED) Adult infusion 4 mcg/min (12/20/12 0700)  . phenylephrine (NEO-SYNEPHRINE) Adult infusion 20 mcg/min (12/20/12 0700)  . vasopressin (PITRESSIN) infusion - *FOR SHOCK*       PRN Medications:  diphenhydrAMINE, fentaNYL, heparin, midazolam, morphine injection, ondansetron (ZOFRAN) IV, ondansetron (ZOFRAN) IV, oxyCODONE, sodium chloride, vasopressin (PITRESSIN) infusion - *FOR SHOCK*   Assessment:   1. Acute on chronic systolic HF with biventricular HF EF 10%  S/p HM II VAD with TV repair 12/18/12  2. RV failure  3. Cardiogenic shock  4. VT s/p Biotronik ICD  5. Chronic renal failure  6. 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 ABL anemia   Plan/Discussion:    POD#2 Stable overnight and has not had any low flows on LVAD or RVAD. Slowing weaning pressors, which she is tolerating. Hgb stable and has had less CT output, will continue to monitor.   She remains 30 lbs from baseline will discuss with TCTS about starting low dose lasix drip.   Would continue full support with RVAD for at least another 24 hours and then we can try and wean slowly.    I reviewed the LVAD parameters from today, and compared the results to the patient's prior recorded data.  No programming changes were made.  The LVAD is functioning within specified parameters.  The nursing staff  does LVAD self-test daily.  LVAD interrogation was negative for any significant power changes, alarms or PI events/speed drops.  LVAD equipment check completed and is in good working order.  Back-up equipment present.   LVAD education done on emergency procedures and precautions and reviewed exit site care.   Length of Stay: 12  Aundria Rud 12/20/2012, 7:17 AM  VAD Team Pager [phone] (7am - 7am)  Patient seen and examined with Ulla Potash, NP. We discussed all aspects of the encounter. I agree with the assessment and plan as stated above. She is improving slowly. Agree with weaning EPI and starting lasix gtt.   The patient is critically ill with multiple organ systems failure and requires high complexity decision making for assessment and support, frequent evaluation and titration of therapies, application of advanced monitoring technologies and extensive interpretation of multiple databases.   Critical Care Time devoted to patient care services described in this note is 35 Minutes.    Geraldine Tesar,MD 8:57 AM

## 2012-12-21 ENCOUNTER — Inpatient Hospital Stay (HOSPITAL_COMMUNITY): Payer: Commercial Managed Care - PPO

## 2012-12-21 DIAGNOSIS — I5023 Acute on chronic systolic (congestive) heart failure: Secondary | ICD-10-CM

## 2012-12-21 DIAGNOSIS — Z95818 Presence of other cardiac implants and grafts: Secondary | ICD-10-CM

## 2012-12-21 DIAGNOSIS — I428 Other cardiomyopathies: Secondary | ICD-10-CM

## 2012-12-21 DIAGNOSIS — I319 Disease of pericardium, unspecified: Secondary | ICD-10-CM

## 2012-12-21 LAB — HEPATIC FUNCTION PANEL
ALT: 20 U/L (ref 0–35)
AST: 58 U/L — ABNORMAL HIGH (ref 0–37)
Albumin: 2.7 g/dL — ABNORMAL LOW (ref 3.5–5.2)
Alkaline Phosphatase: 110 U/L (ref 39–117)
Bilirubin, Direct: 4 mg/dL — ABNORMAL HIGH (ref 0.0–0.3)
Indirect Bilirubin: 2 mg/dL — ABNORMAL HIGH (ref 0.3–0.9)
Total Bilirubin: 6 mg/dL — ABNORMAL HIGH (ref 0.3–1.2)
Total Protein: 4.9 g/dL — ABNORMAL LOW (ref 6.0–8.3)

## 2012-12-21 LAB — BASIC METABOLIC PANEL
BUN: 16 mg/dL (ref 6–23)
Creatinine, Ser: 0.97 mg/dL (ref 0.50–1.10)
GFR calc Af Amer: 70 mL/min — ABNORMAL LOW (ref >90)
GFR calc non Af Amer: 60 mL/min — ABNORMAL LOW (ref >90)
Potassium: 3.5 mEq/L (ref 3.5–5.1)

## 2012-12-21 LAB — CULTURE, RESPIRATORY W GRAM STAIN
Culture: NO GROWTH
Special Requests: NORMAL

## 2012-12-21 LAB — CBC WITH DIFFERENTIAL/PLATELET
Basophils Absolute: 0 10*3/uL (ref 0.0–0.1)
Basophils Relative: 0 % (ref 0–1)
Eosinophils Absolute: 0.2 10*3/uL (ref 0.0–0.7)
Eosinophils Relative: 1 % (ref 0–5)
HCT: 26.7 % — ABNORMAL LOW (ref 36.0–46.0)
Hemoglobin: 9.3 g/dL — ABNORMAL LOW (ref 12.0–15.0)
Lymphocytes Relative: 6 % — ABNORMAL LOW (ref 12–46)
Lymphs Abs: 0.8 10*3/uL (ref 0.7–4.0)
MCH: 29.4 pg (ref 26.0–34.0)
MCHC: 34.8 g/dL (ref 30.0–36.0)
MCV: 84.5 fL (ref 78.0–100.0)
Monocytes Absolute: 0.9 10*3/uL (ref 0.1–1.0)
Monocytes Relative: 7 % (ref 3–12)
Neutro Abs: 11.5 10*3/uL — ABNORMAL HIGH (ref 1.7–7.7)
Neutrophils Relative %: 86 % — ABNORMAL HIGH (ref 43–77)
Platelets: 94 10*3/uL — ABNORMAL LOW (ref 150–400)
RBC: 3.16 MIL/uL — ABNORMAL LOW (ref 3.87–5.11)
RDW: 16.2 % — ABNORMAL HIGH (ref 11.5–15.5)
WBC: 13.4 10*3/uL — ABNORMAL HIGH (ref 4.0–10.5)

## 2012-12-21 LAB — POCT I-STAT 3, ART BLOOD GAS (G3+)
Bicarbonate: 26.2 mEq/L — ABNORMAL HIGH (ref 20.0–24.0)
O2 Saturation: 99 %
Patient temperature: 37.6
TCO2: 27 mmol/L (ref 0–100)

## 2012-12-21 LAB — POCT ACTIVATED CLOTTING TIME
Activated Clotting Time: 150 seconds
Activated Clotting Time: 155 seconds
Activated Clotting Time: 160 seconds
Activated Clotting Time: 175 seconds

## 2012-12-21 LAB — TYPE AND SCREEN
ABO/RH(D): A POS
Antibody Screen: NEGATIVE
Unit division: 0
Unit division: 0
Unit division: 0
Unit division: 0
Unit division: 0
Unit division: 0

## 2012-12-21 LAB — PROTIME-INR
INR: 1.1 (ref 0.00–1.49)
Prothrombin Time: 14 seconds (ref 11.6–15.2)

## 2012-12-21 LAB — APTT
aPTT: 81 seconds — ABNORMAL HIGH (ref 24–37)
aPTT: 97 seconds — ABNORMAL HIGH (ref 24–37)
aPTT: 114 seconds — ABNORMAL HIGH (ref 24–37)

## 2012-12-21 LAB — GLUCOSE, CAPILLARY
Glucose-Capillary: 100 mg/dL — ABNORMAL HIGH (ref 70–99)
Glucose-Capillary: 109 mg/dL — ABNORMAL HIGH (ref 70–99)
Glucose-Capillary: 115 mg/dL — ABNORMAL HIGH (ref 70–99)
Glucose-Capillary: 146 mg/dL — ABNORMAL HIGH (ref 70–99)

## 2012-12-21 LAB — MAGNESIUM: Magnesium: 1.9 mg/dL (ref 1.5–2.5)

## 2012-12-21 LAB — CARBOXYHEMOGLOBIN
Carboxyhemoglobin: 1.6 % — ABNORMAL HIGH (ref 0.5–1.5)
Carboxyhemoglobin: 1.7 % — ABNORMAL HIGH (ref 0.5–1.5)
Carboxyhemoglobin: 1.4 % (ref 0.5–1.5)
Methemoglobin: 2.4 % — ABNORMAL HIGH (ref 0.0–1.5)
Methemoglobin: 1.7 % — ABNORMAL HIGH (ref 0.0–1.5)
O2 Saturation: 100 %
O2 Saturation: 66.2 %
Total hemoglobin: 9.1 g/dL — ABNORMAL LOW (ref 12.0–16.0)
Total hemoglobin: 9 g/dL — ABNORMAL LOW (ref 12.0–16.0)
Total hemoglobin: 8.8 g/dL — ABNORMAL LOW (ref 12.0–16.0)

## 2012-12-21 LAB — HEPARIN LEVEL (UNFRACTIONATED): Heparin Unfractionated: 0.13 IU/mL — ABNORMAL LOW (ref 0.30–0.70)

## 2012-12-21 LAB — PREALBUMIN: Prealbumin: 8.7 mg/dL — ABNORMAL LOW (ref 17.0–34.0)

## 2012-12-21 LAB — PHOSPHORUS: Phosphorus: 2.6 mg/dL (ref 2.3–4.6)

## 2012-12-21 LAB — LACTATE DEHYDROGENASE: LDH: 454 U/L — ABNORMAL HIGH (ref 94–250)

## 2012-12-21 MED ORDER — POTASSIUM CHLORIDE 10 MEQ/50ML IV SOLN
INTRAVENOUS | Status: AC
  Administered 2012-12-21: 10 meq via INTRAVENOUS
  Filled 2012-12-21: qty 150

## 2012-12-21 MED ORDER — POTASSIUM CHLORIDE 10 MEQ/50ML IV SOLN
10.0000 meq | INTRAVENOUS | Status: AC
  Administered 2012-12-21 (×2): 10 meq via INTRAVENOUS

## 2012-12-21 MED ORDER — METOCLOPRAMIDE HCL 5 MG/ML IJ SOLN
10.0000 mg | Freq: Four times a day (QID) | INTRAMUSCULAR | Status: AC
  Administered 2012-12-21 – 2012-12-23 (×8): 10 mg via INTRAVENOUS
  Filled 2012-12-21 (×8): qty 2

## 2012-12-21 MED ORDER — INSULIN ASPART 100 UNIT/ML ~~LOC~~ SOLN
0.0000 [IU] | SUBCUTANEOUS | Status: DC
  Administered 2012-12-21 (×2): 2 [IU] via SUBCUTANEOUS

## 2012-12-21 MED ORDER — POTASSIUM CHLORIDE 10 MEQ/50ML IV SOLN
INTRAVENOUS | Status: AC
  Filled 2012-12-21: qty 100

## 2012-12-21 MED ORDER — PRO-STAT SUGAR FREE PO LIQD
60.0000 mL | Freq: Three times a day (TID) | ORAL | Status: DC
  Administered 2012-12-21 – 2012-12-23 (×6): 60 mL
  Filled 2012-12-21 (×9): qty 60

## 2012-12-21 MED ORDER — POTASSIUM CHLORIDE 10 MEQ/50ML IV SOLN
10.0000 meq | INTRAVENOUS | Status: AC | PRN
  Administered 2012-12-21 (×3): 10 meq via INTRAVENOUS

## 2012-12-21 MED ORDER — JEVITY 1.2 CAL PO LIQD
1000.0000 mL | ORAL | Status: DC
  Administered 2012-12-21: 20 mL/h via OROGASTRIC
  Administered 2012-12-22: 1000 mL via OROGASTRIC
  Filled 2012-12-21 (×4): qty 1000

## 2012-12-21 NOTE — Progress Notes (Signed)
Assessed both arms for PICC insertion. Veins are very deep and very small (with 5Fr PICC catheter occupying 45%-47% of the vein) Veins are not suitable for cannulation for PICC Notified staff RN

## 2012-12-21 NOTE — Progress Notes (Signed)
Anesthesiology:  Ms. Cavendish remains sedated on ventilator stable hemodynamics with full RVAD and LVAD support. Good diuresis on lasix drip.   VS: T- 38.2 BP 81/64 (70) PA 23/17 CVP 10 mixed venous O2 sat 69%  VAD:  8390 RPM Flow 4 LPM Power 4 watts  PI 5.9  RVAD: 2700 rpm Flow 4.29 lpm   SIMV 450 FiO2 0.40 RR 14 PEEP 5 PIP 29 PH 7.40 PCO2 40 PO2 138  H/H 9.3/26.7 Plts 94,000 BUN/Cr 16/0.97 K 3.5  Pro BNP 1470  Pt stable with good hemodynamics, excellent diuresis and improved renal function, plan to reduce RVAD flow today and check RV response with Echo. If this is well tolerated, possibly remove RVAD tomorrow.  Kipp Brood

## 2012-12-21 NOTE — Progress Notes (Signed)
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 EF 15-25% with mild to moderate RV dysfunction  Underwent HM II LVAD implant along with TV repair on 8/18. Once she returned to the ICU she had difficulties with RV and CVP rose to 20-25. She was taken back to the OR for RVAD support.  Stable overnight. RVAD speed increased yesterday to 2700 to rest her RV. No LVAD or RVAD alarms. Remains intubated and sedated, but following commands. Started on lasix gtt at 4 mg hr, weight down 6 lbs and 24 hr I/O 1.3 liters. Still on Dopa 3 mcg, Amio 30 mg, Dobutamine 4 mcg, Levophed 4 mcg, Neo 20 mcg and Epi 0.5 mcg. She is on heparin as well for RVAD.  PA pressures dampened CVP 10 Co-ox 67.5%  RVAD 2700 RPM and 4.3 L  LVAD INTERROGATION:  HeartMate II LVAD:  Flow 3.6 liters/min, speed 8400, power 5.8, PI 3.8.  No PI events   Objective:    Vital Signs:   Temp:  [98.4 F (36.9 C)-100.6 F (38.1 C)] 100.6 F (38.1 C) (08/21 0645) Pulse Rate:  [81-88] 88 (08/20 2335) Resp:  [0-20] 14 (08/21 0645) BP: (83-86)/(66-67) 86/67 mmHg (08/20 2335) SpO2:  [100 %] 100 % (08/21 0600) Arterial Line BP: (94-106)/(59-71) 95/71 mmHg (08/20 0930) FiO2 (%):  [40 %-80 %] 40 % (08/21 0600) Weight:  [188 lb 1.6 oz (85.322 kg)] 188 lb 1.6 oz (85.322 kg) (08/21 0500) Last BM Date: 12/15/12 Mean arterial Pressure 60-70s  Intake/Output:   Intake/Output Summary (Last 24 hours) at 12/21/12 0701 Last data filed at 12/21/12 0645  Gross per 24 hour  Intake 4197.49 ml  Output   5575 ml  Net -1377.51 ml     Physical Exam: General: Intubated and sedated, following commands HEENT: normal Neck: supple. CVP 10 . Carotids 2+ bilat; no bruits. No lymphadenopathy or thryomegaly appreciated. Swan in place R IJ Cor: Mechanical heart sounds with LVAD hum present.; CT in place RVAD tubes in place Lungs: CTA Abdomen: soft,  nontender, ++distended. Hypoactive BS Driveline: C/D/I; securement device intact  Extremities: no cyanosis, clubbing, rash, 2+ edema Neuro: alert & orientedx3, cranial nerves grossly intact. moves all 4 extremities w/o difficulty. Affect pleasant  Telemetry: AV paced 70s  Labs: Basic Metabolic Panel:  Recent Labs Lab 12/18/12 1612  12/18/12 2250  12/19/12 0311 12/19/12 1004 12/19/12 1545 12/20/12 0400 12/21/12 0420  NA 139  < >  --   < > 141 142 139 135 132*  K 3.0*  < >  --   < > 4.3 4.6 4.4 4.1 3.5  CL 105  < >  --   --  110 104 104 102 98  CO2 22  --   --   --  24  --  25 23 25   GLUCOSE 166*  < >  --   < > 134* 103* 103* 114* 115*  BUN 18  < >  --   --  14 12 13 15 16   CREATININE 1.26*  < > 1.20*  --  1.09 1.20* 1.12* 1.04 0.97  CALCIUM 8.4  --   --   --  7.3*  --  8.2* 8.4 8.5  MG 2.2  --  2.7*  --  3.1*  --  2.4 2.2 1.9  PHOS  --   --   --   --  3.2  --   --  3.5 2.6  < > = values in this interval not displayed.  Liver Function Tests:  Recent Labs Lab 12/17/12 2135 12/19/12 0311 12/20/12 0400 12/21/12 0420  AST 33 85* 92* 58*  ALT 44* 17 23 20   ALKPHOS 83  --  49 110  BILITOT 0.5 3.1* 4.8* 6.0*  PROT 6.8  --  4.6* 4.9*  ALBUMIN 3.7  --  2.8* 2.7*   No results found for this basename: LIPASE, AMYLASE,  in the last 168 hours No results found for this basename: AMMONIA,  in the last 168 hours  CBC:  Recent Labs Lab 12/19/12 0311  12/19/12 1545 12/20/12 0400 12/20/12 1605 12/20/12 2200 12/21/12 0420  WBC 6.9  < > 8.5 11.1* 12.1* 13.1* 13.4*  NEUTROABS 5.4  --   --  9.0*  --   --  11.5*  HGB 6.5*  < > 8.2* 8.8* 8.5* 8.0* 9.3*  HCT 18.3*  < > 23.2* 25.0* 24.3* 22.9* 26.7*  MCV 85.5  < > 81.1 82.2 82.9 83.0 84.5  PLT 103*  < > 108* 104* 94* 92* 94*  < > = values in this interval not displayed.  INR:  Recent Labs Lab 12/19/12 0311 12/19/12 0812 12/19/12 1545 12/20/12 0400 12/21/12 0420  INR 1.61* 1.42 1.30 1.37 1.10    Imaging: Dg Chest  Port 1 View  12/20/2012   CLINICAL DATA:  Postop.  EXAM: PORTABLE CHEST - 1 VIEW  COMPARISON:  12/19/2012  FINDINGS: Support devices are stable. No pneumothorax. Mild cardiomegaly. Left base atelectasis, similar to prior study. No visible effusions.  IMPRESSION: No significant change since prior study.   Electronically Signed   By: Charlett Nose   On: 12/20/2012 08:02     Medications:     Scheduled Medications: . acetaminophen (TYLENOL) oral liquid 160 mg/5 mL  650 mg Per Tube Once   Or  . acetaminophen  650 mg Rectal Once  . antiseptic oral rinse  15 mL Mouth Rinse QID  . aspirin EC  325 mg Oral Daily   Or  . aspirin  324 mg Per Tube Daily   Or  . aspirin  300 mg Rectal Daily  . atorvastatin  20 mg Oral q1800  . bisacodyl  10 mg Oral Daily   Or  . bisacodyl  10 mg Rectal Daily  . cefTAZidime (FORTAZ)  IV  1 g Intravenous Q8H  . chlorhexidine  15 mL Mouth Rinse BID  . docusate  200 mg Per Tube Daily  . insulin aspart  0-24 Units Subcutaneous Q4H  . insulin regular  0-10 Units Intravenous TID WC  . magnesium sulfate 1 - 4 g bolus IVPB  2 g Intravenous Once  . mupirocin ointment  1 application Topical BID  . pantoprazole (PROTONIX) IV  40 mg Intravenous Q12H  . sodium bicarbonate  50 mEq Intravenous Once  . vancomycin  1,000 mg Intravenous Q24H    Infusions: . sodium chloride 20 mL/hr at 12/19/12 1700  . sodium chloride 10 mL/hr at 12/15/12 1900  . sodium chloride    . amiodarone (NEXTERONE PREMIX) 360 mg/200 mL dextrose 15 mg/hr (12/21/12 0600)  . dexmedetomidine 0.2 mcg/kg/hr (12/21/12 0610)  . DOBUTamine 4 mcg/kg/min (12/21/12 0600)  . DOPamine 3 mcg/kg/min (12/21/12 0600)  . EPINEPHrine infusion 0.5 mcg/min (12/21/12 0650)  . fentaNYL infusion INTRAVENOUS 100 mcg/hr (12/21/12 0600)  . furosemide (LASIX) infusion 4 mg/hr (12/21/12 0600)  . heparin    .  heparin 750 Units/hr (12/21/12 0646)  . insulin (NOVOLIN-R) infusion Stopped (12/21/12 0005)  . lactated ringers 20  mL/hr at 12/19/12 1900  . midazolam (VERSED) infusion 4 mg/hr (12/21/12 0600)  . nitroGLYCERIN Stopped (12/18/12 1705)  . norepinephrine (LEVOPHED) Adult infusion 4 mcg/min (12/21/12 0600)  . phenylephrine (NEO-SYNEPHRINE) Adult infusion 20 mcg/min (12/21/12 0600)  . vasopressin (PITRESSIN) infusion - *FOR SHOCK*      PRN Medications: diphenhydrAMINE, fentaNYL, heparin, midazolam, morphine injection, ondansetron (ZOFRAN) IV, ondansetron (ZOFRAN) IV, potassium chloride, sodium chloride, vasopressin (PITRESSIN) infusion - *FOR SHOCK*   Assessment:   1. Acute on chronic systolic HF with biventricular HF EF 10%  S/p HM II VAD with TV repair 12/18/12  2. RV failure  3. Cardiogenic shock  4. VT s/p Biotronik ICD  5. Chronic renal failure  6. 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 ABL anemia   Plan/Discussion:    POD#3 Improving slowly. RVAD speed turned up to 2700 last night to rest RV, PA waveform dampened due to increased RVAD speed. MAPs stable 70s. Will slowly wean Epi down and then try to wean Levophed. Hgb stable, 9.3 with minimal CT output.   Weight down 6 lbs, CVP 10 will continue IV lasix gtt at 4 mg/hr. Continue to diureses slowly, do not want CVP much lower in order to prevent RV suction events.   Will place PICC today and insert panda for nutrition.  Goal to go back to the OR tomorrow to try to take RVAD out. Continue heparin for RVAD.  I reviewed the LVAD parameters from today, and compared the results to the patient's prior recorded data.  No programming changes were made.  The LVAD is functioning within specified parameters.  The nursing staff does LVAD self-test daily.  LVAD interrogation was negative for any significant power changes, alarms or PI events/speed drops.  LVAD equipment check completed and is in good working order.  Back-up equipment present.   LVAD education done on emergency procedures and precautions and reviewed exit site care.   Length of Stay: 13 VAD Team Pager  [phone] (7am - 7am) VAD Issue  Non-VAD issues HF Pager [phone]   Patient seen and examined with Ulla Potash, NP. We discussed all aspects of the encounter. I agree with the assessment and plan as stated above.   Continues to improve. RVAD speed increased yesterday. Weaning pressors.  Would continue to wean pressors today and then try to get RVAD speed down. Will do echo today and see what RV is doing at various RVAD speeds. May be best to get some more volume off before explanting RVAD. Will d/w Dr. Donata Clay.   The patient is critically ill with multiple organ systems failure and requires high complexity decision making for assessment and support, frequent evaluation and titration of therapies, application of advanced monitoring technologies and extensive interpretation of multiple databases.   Critical Care Time devoted to patient care services described in this note is 35 Minutes.  Breawna Montenegro,MD 8:57 AM

## 2012-12-21 NOTE — Progress Notes (Signed)
VAD drive line dressing change using sterile technique; site with bloody drainage, no foul odor noted; drive line secured with anchor device.  Pt remains intubated but attempting to open eyes; MOE x 4 to command; nodding and shaking head to answer questions appropriately.

## 2012-12-21 NOTE — Progress Notes (Signed)
. HeartMate 2 Rounding Note  Subjective:   Postop day 3HeartMate 2 implantation with subsequent emergency center mag RVAD because of the RV dysfunction. Patient with stable hemodynamics on BiVAD support. Responsive but comfortable with IV sedation. Mean flows approximately 4 L on each device. No PI events on LVAD. Good urine output now-Lasix drip 3 mg per hour. Heparin started with PTT goal 70-100 and a CT goal 180-200. Heparin currently at 100 units per hour and measuring PTT every 6 hours  Plan is to rest the RV with low CVP and remove fluid with gentle diuresis. Continue antibiotics chest is open except for skin closure.   LVAD INTERROGATION:  HeartMate II LVAD:  Flow 4.1 liters/min, speed 8600 power 4.2, PI 4.8.   Objective:    Vital Signs:   Temp:  [98.4 F (36.9 C)-100.8 F (38.2 C)] 99.5 F (37.5 C) (08/21 1400) Pulse Rate:  [87-89] 87 (08/21 1400) Resp:  [0-18] 14 (08/21 1400) BP: (78-89)/(64-69) 78/64 mmHg (08/21 1134) SpO2:  [100 %] 100 % (08/21 1134) FiO2 (%):  [40 %] 40 % (08/21 1200) Weight:  [188 lb 1.6 oz (85.322 kg)] 188 lb 1.6 oz (85.322 kg) (08/21 0500) Last BM Date: 12/15/12 Mean arterial Pressure 84  Intake/Output:   Intake/Output Summary (Last 24 hours) at 12/21/12 1411 Last data filed at 12/21/12 1315  Gross per 24 hour  Intake 4644.2 ml  Output   6735 ml  Net -2090.8 ml     Physical Exam: General: Intubated in ICU on right ventricular assist device, sedated and comfortable HEENT: normal Neck: supple. JVP normal  No lymphadenopathy or thryomegaly appreciated. Cor: Mechanical heart sounds with LVAD hum present. Lungs: clear Abdomen: soft, nontender, nondistended. No hepatosplenomegaly. No bruits or masses. Good bowel sounds. Extremities: no cyanosis, clubbing, rash, edema extremities warm Neuro: moves all 4 extremities but now is on sedation protocol while on ventilator  Telemetry: AV paced 90 per minute  Labs: Basic Metabolic Panel:  Recent  Labs Lab 12/18/12 1612  12/18/12 2250  12/19/12 0311 12/19/12 1004 12/19/12 1545 12/20/12 0400 12/21/12 0420  NA 139  < >  --   < > 141 142 139 135 132*  K 3.0*  < >  --   < > 4.3 4.6 4.4 4.1 3.5  CL 105  < >  --   --  110 104 104 102 98  CO2 22  --   --   --  24  --  25 23 25   GLUCOSE 166*  < >  --   < > 134* 103* 103* 114* 115*  BUN 18  < >  --   --  14 12 13 15 16   CREATININE 1.26*  < > 1.20*  --  1.09 1.20* 1.12* 1.04 0.97  CALCIUM 8.4  --   --   --  7.3*  --  8.2* 8.4 8.5  MG 2.2  --  2.7*  --  3.1*  --  2.4 2.2 1.9  PHOS  --   --   --   --  3.2  --   --  3.5 2.6  < > = values in this interval not displayed.  Liver Function Tests:  Recent Labs Lab 12/17/12 2135 12/19/12 0311 12/20/12 0400 12/21/12 0420  AST 33 85* 92* 58*  ALT 44* 17 23 20   ALKPHOS 83  --  49 110  BILITOT 0.5 3.1* 4.8* 6.0*  PROT 6.8  --  4.6* 4.9*  ALBUMIN 3.7  --  2.8* 2.7*   No results found for this basename: LIPASE, AMYLASE,  in the last 168 hours No results found for this basename: AMMONIA,  in the last 168 hours  CBC:  Recent Labs Lab 12/19/12 0311  12/19/12 1545 12/20/12 0400 12/20/12 1605 12/20/12 2200 12/21/12 0420  WBC 6.9  < > 8.5 11.1* 12.1* 13.1* 13.4*  NEUTROABS 5.4  --   --  9.0*  --   --  11.5*  HGB 6.5*  < > 8.2* 8.8* 8.5* 8.0* 9.3*  HCT 18.3*  < > 23.2* 25.0* 24.3* 22.9* 26.7*  MCV 85.5  < > 81.1 82.2 82.9 83.0 84.5  PLT 103*  < > 108* 104* 94* 92* 94*  < > = values in this interval not displayed.  INR:  Recent Labs Lab 12/19/12 0311 12/19/12 0812 12/19/12 1545 12/20/12 0400 12/21/12 0420  INR 1.61* 1.42 1.30 1.37 1.10    Other results:  EKG:   Imaging: Dg Chest Port 1 View  12/21/2012   *RADIOLOGY REPORT*  Clinical Data: Hypoxia  PORTABLE CHEST - 1 VIEW  Comparison: December 20, 2012  Findings tube and catheter positions are unchanged.  There is no appreciable pneumothorax.  There is mild left base atelectasis. Lungs are otherwise clear.  Heart is  borderline prominent but stable.  Pulmonary vascularity is normal.  No adenopathy.  IMPRESSION: No appreciable change compared to 1 day prior.   Original Report Authenticated By: Bretta Bang, M.D.   Dg Chest Port 1 View  12/20/2012   CLINICAL DATA:  Postop.  EXAM: PORTABLE CHEST - 1 VIEW  COMPARISON:  12/19/2012  FINDINGS: Support devices are stable. No pneumothorax. Mild cardiomegaly. Left base atelectasis, similar to prior study. No visible effusions.  IMPRESSION: No significant change since prior study.   Electronically Signed   By: Charlett Nose   On: 12/20/2012 08:02     Medications:     Scheduled Medications: . antiseptic oral rinse  15 mL Mouth Rinse QID  . aspirin EC  325 mg Oral Daily   Or  . aspirin  324 mg Per Tube Daily   Or  . aspirin  300 mg Rectal Daily  . bisacodyl  10 mg Oral Daily   Or  . bisacodyl  10 mg Rectal Daily  . cefTAZidime (FORTAZ)  IV  1 g Intravenous Q8H  . chlorhexidine  15 mL Mouth Rinse BID  . docusate  200 mg Per Tube Daily  . feeding supplement (JEVITY 1.2 CAL)  1,000 mL Per OG tube Q24H  . feeding supplement  60 mL Per Tube TID  . insulin aspart  0-24 Units Subcutaneous Q4H  . magnesium sulfate 1 - 4 g bolus IVPB  2 g Intravenous Once  . metoCLOPramide (REGLAN) injection  10 mg Intravenous Q6H  . mupirocin ointment  1 application Topical BID  . pantoprazole (PROTONIX) IV  40 mg Intravenous Q12H  . potassium chloride  10 mEq Intravenous Q1 Hr x 2  . sodium bicarbonate  50 mEq Intravenous Once  . vancomycin  1,000 mg Intravenous Q24H    Infusions: . sodium chloride 20 mL/hr at 12/21/12 1400  . sodium chloride 10 mL/hr at 12/21/12 1400  . sodium chloride    . dexmedetomidine 0.198 mcg/kg/hr (12/21/12 1400)  . DOBUTamine 4.019 mcg/kg/min (12/21/12 1400)  . DOPamine 2.991 mcg/kg/min (12/21/12 1400)  . EPINEPHrine infusion 1.013 mcg/min (12/21/12 1400)  . fentaNYL infusion INTRAVENOUS 100 mcg/hr (12/21/12 1400)  . furosemide (LASIX)  infusion 3 mg/hr (  12/21/12 1400)  . heparin    . heparin 800 Units/hr (12/21/12 1400)  . insulin (NOVOLIN-R) infusion Stopped (12/21/12 0005)  . lactated ringers 20 mL/hr at 12/21/12 1400  . midazolam (VERSED) infusion 4 mg/hr (12/21/12 1400)  . nitroGLYCERIN Stopped (12/18/12 1705)  . norepinephrine (LEVOPHED) Adult infusion 4 mcg/min (12/21/12 1400)  . phenylephrine (NEO-SYNEPHRINE) Adult infusion 20 mcg/min (12/21/12 1400)  . 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 Sputum culture pending and antibiotics continued. Patient would benefit from nutrition however with absent bowel sounds and inability to use TPN with elevated bilirubin and fluid overload will wait and follow. Patient's family provided with clinical update and plan of care.\\  We'll continue biventricular support for the next 3 days and then assess RV native function early next week with bedside transthoracic 2-D echo.    I reviewed the LVAD parameters from today, and compared the results to the patient's prior recorded data.  No programming changes were made.  The LVAD is functioning within specified parameters.  The patient performs LVAD self-test daily.  LVAD interrogation was negative for any significant power changes, alarms or PI events/speed drops.  LVAD equipment check completed and is in good working order.  Back-up equipment present.   LVAD  education done on emergency procedures and precautions and reviewed exit site care.  Length of Stay: 13  [name] III,PETER 12/21/2012, 2:11 PM

## 2012-12-21 NOTE — Progress Notes (Addendum)
Dr. Donata Clay and Bensimhon at bedside for RVAD wean w/echo; IV heparin given prior to procedure.  RVAD:  RPM:  Flow:    CVP:  MAP:  12:35  2700  4.35  11  70 12:40  2500  3.9  11  69 12:43  2000  2.9  11  66 12:45  1700  2.38  11  63    LVAD:  RPM:  Flow:  PI:  Power:  12:35  8390  3.6  5.0  3.9 12:40  8390  3.8  4.9  4.0 12:43  8390  3.5  4.2  4.2 12:45  8400  3.6   4.2  Final settings RVAD 2600 RPM, flow 4.1; LVAD 8400 RPM, flow 3.5, PI 5.1, and power 3.9 with MAP 70, CVP 10.  Will not take the patient back to OR for decannulation of RVAD tomorrow. RV contractility is suboptimal with low RVAD flow although patient was on minimal inotropes and  the CVP did not not increase with reduction in RVAD flow. Continue biventricular support for now, low level of inotropes forSVR control and diuresis with Lasix drip.

## 2012-12-21 NOTE — Progress Notes (Signed)
NUTRITION FOLLOW-UP  DOCUMENTATION CODES Per approved criteria  -Not Applicable   INTERVENTION: Continue Jevity 1.2 at 20 ml/hr. To better meet protein needs and maximize healing, will add 60 ml Prostat liquid protein TID. This regimen will provide: 1176 kcal (72% of estimated kcal needs), 117 grams protein (100% of estimated protein needs) and 387 ml free water. Once ready to advance enteral nutrition, recommend advancement of Jevity 1.2 by 10 ml q 4 hours to goal of 35 ml/hr. Continue 60 ml Prostat liquid protein TID. This regimen will provide: 1608 kcal, 137 grams protein, and 678 ml free water. RD to continue to follow nutrition care plan.  NUTRITION DIAGNOSIS: Inadequate oral intake now related to inability to eat as evidenced by NPO status. Ongoing.  Goal: Intake to meet >90% of estimated nutrition needs. Unmet.  Monitor:  Vent settings, labs, weight trend, post-op healing and recovery, tolerance of nutrition support  ASSESSMENT: Underwent the following 8/18: INSERTION OF IMPLANTABLE LEFT VENTRICULAR ASSIST DEVICE  INTRAOPERATIVE TRANSESOPHAGEAL ECHOCARDIOGRAM  TRICUSPID VALVE REPAIR  INSERTION OF IMPLANTABLE RIGHT VENTRICULAR ASSIST DEVICE   Pt found to have R ventricular dysfunction s/p LVAD implantation, required trip to OR later that evening for placement of RVAD. Pt now with LVAD and RVAD. Planning for possible removal of RVAD in OR tomorrow.  Patient without bowel sounds yesterday. RN reports that pt with bowel sounds now and orders of Jevity 1.2 at 20 ml/hr in place at this time. This rate will provide: 576 kcal, 27 grams protein, 387 ml free water.  Weight beginning to trend back down, pt is +24 lb.  Remains intubated on ventilator support.  MV: 7.9 L/min Temp:Temp (24hrs), Avg:99.5 F (37.5 C), Min:98.4 F (36.9 C), Max:100.8 F (38.2 C)  Propofol: none  Height: Ht Readings from Last 1 Encounters:  12/18/12 5' 2.99" (1.6 m)    Weight: Wt Readings from  Last 1 Encounters:  12/21/12 188 lb 1.6 oz (85.322 kg)  Admission wt: 165 lb Current wt is up 30 lb.  BMI:  Body mass index is 29.3 kg/(m^2) - using admission weight.  Estimated Nutritional Needs: Kcal: 1626 Protein: 112 - 131 gm Fluid: 1.5 - 2 L  Skin:  2+ edema Abdomen incision Chest incision - with wound VAC  Diet Order: NPO  EDUCATION NEEDS: -Education needs addressed. Provided many handouts on heart healthy eating, including label reading tips per patient request on 8/15.   Intake/Output Summary (Last 24 hours) at 12/21/12 0943 Last data filed at 12/21/12 0900  Gross per 24 hour  Intake 4366.9 ml  Output   6470 ml  Net -2103.1 ml    Last BM: 8/15  Labs:   Recent Labs Lab 12/19/12 0311  12/19/12 1545 12/20/12 0400 12/21/12 0420  NA 141  < > 139 135 132*  K 4.3  < > 4.4 4.1 3.5  CL 110  < > 104 102 98  CO2 24  --  25 23 25   BUN 14  < > 13 15 16   CREATININE 1.09  < > 1.12* 1.04 0.97  CALCIUM 7.3*  --  8.2* 8.4 8.5  MG 3.1*  --  2.4 2.2 1.9  PHOS 3.2  --   --  3.5 2.6  GLUCOSE 134*  < > 103* 114* 115*  < > = values in this interval not displayed.  CBG (last 3)   Recent Labs  12/21/12 0059 12/21/12 0401 12/21/12 0754  GLUCAP 109* 124* 115*    Scheduled Meds: . acetaminophen (TYLENOL)  oral liquid 160 mg/5 mL  650 mg Per Tube Once   Or  . acetaminophen  650 mg Rectal Once  . antiseptic oral rinse  15 mL Mouth Rinse QID  . aspirin EC  325 mg Oral Daily   Or  . aspirin  324 mg Per Tube Daily   Or  . aspirin  300 mg Rectal Daily  . atorvastatin  20 mg Oral q1800  . bisacodyl  10 mg Oral Daily   Or  . bisacodyl  10 mg Rectal Daily  . cefTAZidime (FORTAZ)  IV  1 g Intravenous Q8H  . chlorhexidine  15 mL Mouth Rinse BID  . docusate  200 mg Per Tube Daily  . feeding supplement (JEVITY 1.2 CAL)  1,000 mL Per OG tube Q24H  . insulin aspart  0-24 Units Subcutaneous Q4H  . magnesium sulfate 1 - 4 g bolus IVPB  2 g Intravenous Once  . mupirocin  ointment  1 application Topical BID  . pantoprazole (PROTONIX) IV  40 mg Intravenous Q12H  . sodium bicarbonate  50 mEq Intravenous Once  . vancomycin  1,000 mg Intravenous Q24H    Continuous Infusions: . sodium chloride 20 mL/hr at 12/19/12 1700  . sodium chloride 10 mL/hr at 12/15/12 1900  . sodium chloride    . amiodarone (NEXTERONE PREMIX) 360 mg/200 mL dextrose 15 mg/hr (12/21/12 0800)  . dexmedetomidine 0.2 mcg/kg/hr (12/21/12 0800)  . DOBUTamine 4 mcg/kg/min (12/21/12 0800)  . DOPamine 3 mcg/kg/min (12/21/12 0800)  . EPINEPHrine infusion 0.5 mcg/min (12/21/12 0800)  . fentaNYL infusion INTRAVENOUS 100 mcg/hr (12/21/12 0800)  . furosemide (LASIX) infusion 4 mg/hr (12/21/12 0800)  . heparin    . heparin 800 Units/hr (12/21/12 0850)  . insulin (NOVOLIN-R) infusion Stopped (12/21/12 0005)  . lactated ringers 20 mL/hr at 12/19/12 1900  . midazolam (VERSED) infusion 4 mg/hr (12/21/12 0818)  . nitroGLYCERIN Stopped (12/18/12 1705)  . norepinephrine (LEVOPHED) Adult infusion 4 mcg/min (12/21/12 0800)  . phenylephrine (NEO-SYNEPHRINE) Adult infusion 20 mcg/min (12/21/12 0925)  . vasopressin (PITRESSIN) infusion - *FOR SHOCK*       Jarold Motto MS, RD, LDN Pager: [phone] After-hours pager: [phone]

## 2012-12-22 ENCOUNTER — Inpatient Hospital Stay (HOSPITAL_COMMUNITY): Payer: Commercial Managed Care - PPO

## 2012-12-22 DIAGNOSIS — Z95818 Presence of other cardiac implants and grafts: Secondary | ICD-10-CM

## 2012-12-22 DIAGNOSIS — I5023 Acute on chronic systolic (congestive) heart failure: Secondary | ICD-10-CM

## 2012-12-22 DIAGNOSIS — I428 Other cardiomyopathies: Secondary | ICD-10-CM

## 2012-12-22 LAB — HEPATIC FUNCTION PANEL
ALT: 18 U/L (ref 0–35)
AST: 39 U/L — ABNORMAL HIGH (ref 0–37)
Albumin: 2.4 g/dL — ABNORMAL LOW (ref 3.5–5.2)
Alkaline Phosphatase: 85 U/L (ref 39–117)
Bilirubin, Direct: 2.9 mg/dL — ABNORMAL HIGH (ref 0.0–0.3)
Indirect Bilirubin: 1.6 mg/dL — ABNORMAL HIGH (ref 0.3–0.9)
Total Bilirubin: 4.5 mg/dL — ABNORMAL HIGH (ref 0.3–1.2)
Total Protein: 4.9 g/dL — ABNORMAL LOW (ref 6.0–8.3)

## 2012-12-22 LAB — POCT I-STAT 7, (LYTES, BLD GAS, ICA,H+H)
Hemoglobin: 9.2 g/dL — ABNORMAL LOW (ref 12.0–15.0)
O2 Saturation: 99 %
Patient temperature: 37.5
Potassium: 3.4 mEq/L — ABNORMAL LOW (ref 3.5–5.1)
TCO2: 27 mmol/L (ref 0–100)
pCO2 arterial: 44 mmHg (ref 35.0–45.0)
pH, Arterial: 7.382 (ref 7.350–7.450)
pO2, Arterial: 144 mmHg — ABNORMAL HIGH (ref 80.0–100.0)

## 2012-12-22 LAB — CARBOXYHEMOGLOBIN
Carboxyhemoglobin: 1.8 % — ABNORMAL HIGH (ref 0.5–1.5)
Carboxyhemoglobin: 1.9 % — ABNORMAL HIGH (ref 0.5–1.5)
Methemoglobin: 1.8 % — ABNORMAL HIGH (ref 0.0–1.5)
Methemoglobin: 1.8 % — ABNORMAL HIGH (ref 0.0–1.5)
O2 Saturation: 70.5 %
O2 Saturation: 72.7 %
Total hemoglobin: 8.2 g/dL — ABNORMAL LOW (ref 12.0–16.0)
Total hemoglobin: 8.7 g/dL — ABNORMAL LOW (ref 12.0–16.0)

## 2012-12-22 LAB — CBC
HCT: 24.8 % — ABNORMAL LOW (ref 36.0–46.0)
HCT: 27.8 % — ABNORMAL LOW (ref 36.0–46.0)
Hemoglobin: 8.5 g/dL — ABNORMAL LOW (ref 12.0–15.0)
Hemoglobin: 9.6 g/dL — ABNORMAL LOW (ref 12.0–15.0)
MCH: 29.2 pg (ref 26.0–34.0)
MCH: 29.7 pg (ref 26.0–34.0)
MCHC: 34.3 g/dL (ref 30.0–36.0)
MCHC: 34.5 g/dL (ref 30.0–36.0)
MCV: 85.2 fL (ref 78.0–100.0)
MCV: 86.1 fL (ref 78.0–100.0)
Platelets: 112 10*3/uL — ABNORMAL LOW (ref 150–400)
Platelets: 114 10*3/uL — ABNORMAL LOW (ref 150–400)
RBC: 2.91 MIL/uL — ABNORMAL LOW (ref 3.87–5.11)
RBC: 3.23 MIL/uL — ABNORMAL LOW (ref 3.87–5.11)
RDW: 16.5 % — ABNORMAL HIGH (ref 11.5–15.5)
RDW: 16.3 % — ABNORMAL HIGH (ref 11.5–15.5)
WBC: 14.1 10*3/uL — ABNORMAL HIGH (ref 4.0–10.5)
WBC: 15.9 10*3/uL — ABNORMAL HIGH (ref 4.0–10.5)

## 2012-12-22 LAB — BASIC METABOLIC PANEL
BUN: 18 mg/dL (ref 6–23)
BUN: 19 mg/dL (ref 6–23)
CO2: 28 mEq/L (ref 19–32)
Calcium: 8.1 mg/dL — ABNORMAL LOW (ref 8.4–10.5)
Chloride: 97 mEq/L (ref 96–112)
Creatinine, Ser: 0.9 mg/dL (ref 0.50–1.10)
GFR calc Af Amer: 76 mL/min — ABNORMAL LOW (ref >90)
GFR calc non Af Amer: 63 mL/min — ABNORMAL LOW (ref >90)
GFR calc non Af Amer: 66 mL/min — ABNORMAL LOW (ref >90)
Glucose, Bld: 115 mg/dL — ABNORMAL HIGH (ref 70–99)
Glucose, Bld: 158 mg/dL — ABNORMAL HIGH (ref 70–99)
Potassium: 3.4 mEq/L — ABNORMAL LOW (ref 3.5–5.1)
Potassium: 3.6 mEq/L (ref 3.5–5.1)
Sodium: 133 mEq/L — ABNORMAL LOW (ref 135–145)

## 2012-12-22 LAB — POCT I-STAT 3, ART BLOOD GAS (G3+)
Bicarbonate: 29.1 mEq/L — ABNORMAL HIGH (ref 20.0–24.0)
O2 Saturation: 99 %
pCO2 arterial: 46.7 mmHg — ABNORMAL HIGH (ref 35.0–45.0)
pO2, Arterial: 144 mmHg — ABNORMAL HIGH (ref 80.0–100.0)

## 2012-12-22 LAB — APTT
aPTT: 90 seconds — ABNORMAL HIGH (ref 24–37)
aPTT: 87 seconds — ABNORMAL HIGH (ref 24–37)
aPTT: 86 seconds — ABNORMAL HIGH (ref 24–37)
aPTT: 90 seconds — ABNORMAL HIGH (ref 24–37)

## 2012-12-22 LAB — HEPARIN LEVEL (UNFRACTIONATED): Heparin Unfractionated: 0.2 IU/mL — ABNORMAL LOW (ref 0.30–0.70)

## 2012-12-22 LAB — CALCIUM, IONIZED: Calcium, Ion: 1.17 mmol/L (ref 1.13–1.30)

## 2012-12-22 LAB — POCT ACTIVATED CLOTTING TIME
Activated Clotting Time: 134 seconds
Activated Clotting Time: 145 seconds
Activated Clotting Time: 145 seconds
Activated Clotting Time: 150 seconds
Activated Clotting Time: 150 seconds
Activated Clotting Time: 150 seconds
Activated Clotting Time: 155 seconds
Activated Clotting Time: 155 seconds
Activated Clotting Time: 160 seconds
Activated Clotting Time: 160 seconds

## 2012-12-22 LAB — GLUCOSE, CAPILLARY
Glucose-Capillary: 109 mg/dL — ABNORMAL HIGH (ref 70–99)
Glucose-Capillary: 95 mg/dL (ref 70–99)
Glucose-Capillary: 143 mg/dL — ABNORMAL HIGH (ref 70–99)

## 2012-12-22 LAB — PROTIME-INR
INR: 1.13 (ref 0.00–1.49)
Prothrombin Time: 14.3 seconds (ref 11.6–15.2)

## 2012-12-22 LAB — VANCOMYCIN, TROUGH: Vancomycin Tr: 8.6 ug/mL — ABNORMAL LOW (ref 10.0–20.0)

## 2012-12-22 LAB — PHOSPHORUS: Phosphorus: 2.5 mg/dL (ref 2.3–4.6)

## 2012-12-22 LAB — PREPARE RBC (CROSSMATCH)

## 2012-12-22 LAB — LACTATE DEHYDROGENASE: LDH: 405 U/L — ABNORMAL HIGH (ref 94–250)

## 2012-12-22 LAB — MAGNESIUM: Magnesium: 1.9 mg/dL (ref 1.5–2.5)

## 2012-12-22 MED ORDER — MIDAZOLAM HCL 2 MG/2ML IJ SOLN
INTRAMUSCULAR | Status: AC
  Filled 2012-12-22: qty 2

## 2012-12-22 MED ORDER — POTASSIUM CHLORIDE 10 MEQ/50ML IV SOLN
10.0000 meq | INTRAVENOUS | Status: AC | PRN
  Administered 2012-12-22 (×2): 10 meq via INTRAVENOUS
  Filled 2012-12-22: qty 100

## 2012-12-22 MED ORDER — ACETAMINOPHEN 160 MG/5ML PO SOLN: 650.0000 mg | Freq: Four times a day (QID) | ORAL | Status: DC | PRN

## 2012-12-22 MED ORDER — POTASSIUM CHLORIDE 10 MEQ/50ML IV SOLN
INTRAVENOUS | Status: AC
  Administered 2012-12-22: 10 meq via INTRAVENOUS
  Filled 2012-12-22: qty 150

## 2012-12-22 MED ORDER — ALBUMIN HUMAN 5 % IV SOLN
INTRAVENOUS | Status: AC
  Administered 2012-12-22: 12.5 g
  Filled 2012-12-22: qty 250

## 2012-12-22 MED ORDER — ACETAMINOPHEN 160 MG/5ML PO SOLN
650.0000 mg | Freq: Four times a day (QID) | ORAL | Status: DC | PRN
  Administered 2012-12-22 – 2013-01-07 (×3): 650 mg via ORAL
  Filled 2012-12-22 (×3): qty 20.3

## 2012-12-22 MED ORDER — AMIODARONE HCL IN DEXTROSE 360-4.14 MG/200ML-% IV SOLN
30.0000 mg/h | INTRAVENOUS | Status: DC
  Administered 2012-12-22 – 2012-12-27 (×11): 30 mg/h via INTRAVENOUS
  Filled 2012-12-22 (×22): qty 200

## 2012-12-22 MED ORDER — POTASSIUM CHLORIDE 10 MEQ/50ML IV SOLN
10.0000 meq | INTRAVENOUS | Status: AC
  Administered 2012-12-22 (×2): 10 meq via INTRAVENOUS
  Filled 2012-12-22: qty 100

## 2012-12-22 MED ORDER — ASPIRIN 81 MG PO CHEW
324.0000 mg | CHEWABLE_TABLET | Freq: Every day | ORAL | Status: DC
  Administered 2012-12-22 – 2013-01-08 (×12): 324 mg
  Filled 2012-12-22 (×12): qty 4

## 2012-12-22 MED ORDER — VANCOMYCIN HCL IN DEXTROSE 750-5 MG/150ML-% IV SOLN
750.0000 mg | Freq: Two times a day (BID) | INTRAVENOUS | Status: DC
  Administered 2012-12-22 – 2012-12-23 (×3): 750 mg via INTRAVENOUS
  Filled 2012-12-22 (×4): qty 150

## 2012-12-22 MED ORDER — INSULIN ASPART 100 UNIT/ML ~~LOC~~ SOLN
0.0000 [IU] | SUBCUTANEOUS | Status: DC
  Administered 2012-12-22 – 2012-12-24 (×8): 2 [IU] via SUBCUTANEOUS
  Administered 2012-12-24: 4 [IU] via SUBCUTANEOUS
  Administered 2012-12-25 (×2): 2 [IU] via SUBCUTANEOUS
  Administered 2012-12-25: 4 [IU] via SUBCUTANEOUS
  Administered 2012-12-26 – 2012-12-29 (×14): 2 [IU] via SUBCUTANEOUS
  Administered 2012-12-29 (×2): 4 [IU] via SUBCUTANEOUS
  Administered 2012-12-29 – 2012-12-30 (×5): 2 [IU] via SUBCUTANEOUS
  Administered 2012-12-30: 4 [IU] via SUBCUTANEOUS
  Administered 2012-12-30 – 2012-12-31 (×6): 2 [IU] via SUBCUTANEOUS
  Administered 2012-12-31: 4 [IU] via SUBCUTANEOUS
  Administered 2013-01-01 (×2): 2 [IU] via SUBCUTANEOUS
  Administered 2013-01-01 (×2): 4 [IU] via SUBCUTANEOUS
  Administered 2013-01-01: 2 [IU] via SUBCUTANEOUS
  Administered 2013-01-01 – 2013-01-02 (×4): 4 [IU] via SUBCUTANEOUS
  Administered 2013-01-02 (×2): 2 [IU] via SUBCUTANEOUS
  Administered 2013-01-02: 4 [IU] via SUBCUTANEOUS
  Administered 2013-01-03 – 2013-01-04 (×6): 2 [IU] via SUBCUTANEOUS
  Administered 2013-01-04: 4 [IU] via SUBCUTANEOUS
  Administered 2013-01-04: 2 [IU] via SUBCUTANEOUS
  Administered 2013-01-04: 4 [IU] via SUBCUTANEOUS
  Administered 2013-01-05 – 2013-01-07 (×7): 2 [IU] via SUBCUTANEOUS
  Administered 2013-01-08: 4 [IU] via SUBCUTANEOUS

## 2012-12-22 MED ORDER — MAGNESIUM SULFATE 40 MG/ML IJ SOLN
2.0000 g | Freq: Once | INTRAMUSCULAR | Status: AC
  Administered 2012-12-22: 2 g via INTRAVENOUS
  Filled 2012-12-22: qty 50

## 2012-12-22 MED ORDER — ASPIRIN 81 MG PO CHEW
CHEWABLE_TABLET | ORAL | Status: AC
  Filled 2012-12-22: qty 4

## 2012-12-22 MED ORDER — POTASSIUM CHLORIDE 10 MEQ/50ML IV SOLN
10.0000 meq | INTRAVENOUS | Status: AC
  Administered 2012-12-22 (×2): 10 meq via INTRAVENOUS

## 2012-12-22 MED ORDER — JEVITY 1.2 CAL PO LIQD
1000.0000 mL | ORAL | Status: DC
  Filled 2012-12-22 (×2): qty 1000

## 2012-12-22 NOTE — Progress Notes (Signed)
NUTRITION FOLLOW-UP  INTERVENTION: Continue Jevity 1.2 at 30 ml/hr and 60 ml Prostat liquid protein TID. This regimen provides: 1464 kcal (90% of estimated kcal needs), 130 grams protein (100% of estimated protein needs) and 581 ml free water. Once ready to advance enteral nutrition, recommend advancement of Jevity 1.2 by 10 ml q 4 hours to goal of 35 ml/hr. Continue 60 ml Prostat liquid protein TID. This regimen will provide: 1608 kcal, 137 grams protein, and 678 ml free water. RD to continue to follow nutrition care plan.  NUTRITION DIAGNOSIS: Inadequate oral intake now related to inability to eat as evidenced by NPO status. Ongoing.  Goal: Intake to meet >90% of estimated nutrition needs. Met.  Monitor:  Vent settings, labs, weight trend, post-op healing and recovery, tolerance of nutrition support  ASSESSMENT: Underwent the following 8/18: INSERTION OF IMPLANTABLE LEFT VENTRICULAR ASSIST DEVICE  INTRAOPERATIVE TRANSESOPHAGEAL ECHOCARDIOGRAM  TRICUSPID VALVE REPAIR  INSERTION OF IMPLANTABLE RIGHT VENTRICULAR ASSIST DEVICE   Pt found to have R ventricular dysfunction s/p LVAD implantation, required trip to OR later that evening for placement of RVAD. Pt now with LVAD and RVAD.   Pt had difficulty with tube feedings last evening, per RN, tube and pump malfunctioned. Enteral nutrition resumed this morning and increased to Jevity 1.2 at 30 ml/hr - RN reports pt is tolerating regimen well. Receiving 60 ml Prostat liquid protein via tube TID. This regimen is providing 1464 kcal, 130 grams protein and 581 ml free water.  Weight beginning to trend back down, pt is now +18 lb.  Remains intubated on ventilator support.  MV: 7.2 L/min Temp:Temp (24hrs), Avg:100 F (37.8 C), Min:99.3 F (37.4 C), Max:100.9 F (38.3 C)  Propofol: none  Height: Ht Readings from Last 1 Encounters:  12/18/12 5' 2.99" (1.6 m)    Weight: Wt Readings from Last 1 Encounters:  12/22/12 183 lb 3.2 oz (83.1  kg)  Admission wt: 165 lb Current wt is up 30 lb.  BMI:  Body mass index is 29.3 kg/(m^2) - using admission weight.  Estimated Nutritional Needs: Kcal: 1625 Protein: 112 - 131 gm Fluid: 1.5 - 2 L  Skin:  Abdomen incision Chest incision - with wound VAC  Diet Order: NPO  EDUCATION NEEDS: -Education needs addressed. Provided many handouts on heart healthy eating, including label reading tips per patient request on 8/15.   Intake/Output Summary (Last 24 hours) at 12/22/12 1028 Last data filed at 12/22/12 1000  Gross per 24 hour  Intake 4088.28 ml  Output   4790 ml  Net -701.72 ml    Last BM: 8/15  Labs:   Recent Labs Lab 12/20/12 0400 12/21/12 0420 12/21/12 2159 12/22/12 0432  NA 135 132* 137 133*  K 4.1 3.5 3.4* 3.4*  CL 102 98  --  97  CO2 23 25  --  28  BUN 15 16  --  18  CREATININE 1.04 0.97  --  0.93  CALCIUM 8.4 8.5  --  8.3*  MG 2.2 1.9  --  1.9  PHOS 3.5 2.6  --  2.5  GLUCOSE 114* 115*  --  115*    CBG (last 3)   Recent Labs  12/21/12 1922 12/22/12 0355 12/22/12 0749  GLUCAP 146* 109* 95    Scheduled Meds: . antiseptic oral rinse  15 mL Mouth Rinse QID  . aspirin      . [START ON 12/23/2012] aspirin  324 mg Per Tube Daily  . aspirin  300 mg Rectal Daily  .  bisacodyl  10 mg Oral Daily   Or  . bisacodyl  10 mg Rectal Daily  . cefTAZidime (FORTAZ)  IV  1 g Intravenous Q8H  . chlorhexidine  15 mL Mouth Rinse BID  . docusate  200 mg Per Tube Daily  . [START ON 12/23/2012] feeding supplement (JEVITY 1.2 CAL)  1,000 mL Per OG tube Q24H  . feeding supplement  60 mL Per Tube TID  . magnesium sulfate 1 - 4 g bolus IVPB  2 g Intravenous Once  . metoCLOPramide (REGLAN) injection  10 mg Intravenous Q6H  . mupirocin ointment  1 application Topical BID  . pantoprazole (PROTONIX) IV  40 mg Intravenous Q12H  . potassium chloride  10 mEq Intravenous Q1 Hr x 2  . vancomycin  750 mg Intravenous Q12H    Continuous Infusions: . sodium chloride 20 mL/hr  at 12/22/12 0400  . sodium chloride 10 mL/hr at 12/21/12 1900  . sodium chloride 250 mL (12/22/12 0400)  . amiodarone (NEXTERONE PREMIX) 360 mg/200 mL dextrose 30 mg/hr (12/22/12 0937)  . dexmedetomidine 0.2 mcg/kg/hr (12/22/12 0845)  . DOBUTamine 4 mcg/kg/min (12/22/12 0400)  . DOPamine 3 mcg/kg/min (12/22/12 0400)  . EPINEPHrine infusion 0.5 mcg/min (12/22/12 0400)  . fentaNYL infusion INTRAVENOUS 100 mcg/hr (12/22/12 0400)  . furosemide (LASIX) infusion 3 mg/hr (12/22/12 0400)  . heparin 800 Units/hr (12/22/12 0400)  . insulin (NOVOLIN-R) infusion Stopped (12/21/12 0005)  . lactated ringers 20 mL/hr at 12/22/12 0400  . midazolam (VERSED) infusion 4 mg/hr (12/22/12 0600)  . nitroGLYCERIN Stopped (12/18/12 1705)  . norepinephrine (LEVOPHED) Adult infusion 8 mcg/min (12/22/12 0400)  . phenylephrine (NEO-SYNEPHRINE) Adult infusion 35.067 mcg/min (12/22/12 0816)     Jarold Motto MS, RD, LDN Pager: [phone] After-hours pager: [phone]

## 2012-12-22 NOTE — Consult Note (Addendum)
Wound care follow-up: Consult requested by Dr Morton Peters for sternal vac dressing change.  Refer to previous [facility] progress notes from 8/19. Midline sternal incision with sutures intact, no open wound.  Well-approximated incision; no further bleeding at this time and vac dressing re-applied.  Mepitel contact layer, then one piece of black foam, then cont suction.  Pt sedated during procedure.  No drainage in cannister. Incisional vac dressing can remain in place up to a week without dressing changes, according to [facility] literature. Please re-consult if further assistance is needed.  Thank-you,  Cammie Mcgee MSN, RN, CWOCN, [place], CNS [phone]

## 2012-12-22 NOTE — Progress Notes (Signed)
. HeartMate 2 Rounding Note  Subjective:   Postop day #4 HeartMate 2 implantation with subsequent emergency centri- mag RVAD because of the RV dysfunction. Patient with stable hemodynamics on BiVAD support. Responsive but comfortable with IV sedation. Mean flows approximately 4 L on each device. No PI events on LVAD. Good urine output now-Lasix drip 3 mg per hour. Heparin started with PTT goal 70-100 and  ACT goal 180. Heparin currently at 800 units per hour and measuring PTT every 6 hours  Plan is to rest the RV with low CVP and remove fluid with gentle diuresis. Continue antibiotics- chest is open except for skin closure patient with a low-grade temperature and leukocytosis, sputum and urine and blood cultures pending   LVAD INTERROGATION:  HeartMate II LVAD:  Flow 4.1 liters/min, speed 8600 power 4.2, PI 4.8.   Objective:    Vital Signs:   Temp:  [98.8 F (37.1 C)-100.9 F (38.3 C)] 99.1 F (37.3 C) (08/22 1830) Pulse Rate:  [62-88] 62 (08/22 1830) Resp:  [0-29] 14 (08/22 1830) BP: (82-92)/(67-69) 87/68 mmHg (08/22 1551) SpO2:  [97 %-100 %] 99 % (08/22 1551) FiO2 (%):  [40 %] 40 % (08/22 1551) Weight:  [183 lb 3.2 oz (83.1 kg)] 183 lb 3.2 oz (83.1 kg) (08/22 1059) Last BM Date: 12/15/12 Mean arterial Pressure 84  Intake/Output:   Intake/Output Summary (Last 24 hours) at 12/22/12 1849 Last data filed at 12/22/12 1800  Gross per 24 hour  Intake 5050.3 ml  Output   4345 ml  Net  705.3 ml     Physical Exam: General: Intubated in ICU on right ventricular assist device, sedated and comfortable HEENT: normal Neck: supple. JVP normal  No lymphadenopathy or thryomegaly appreciated. Cor: Mechanical heart sounds with LVAD hum present. Lungs: clear Abdomen: soft, nontender, nondistended. No hepatosplenomegaly. No bruits or masses. Good bowel sounds. Extremities: no cyanosis, clubbing, rash, edema extremities warm Neuro: moves all 4 extremities but now is on sedation protocol  while on ventilator  Telemetry: AV paced 90 per minute  Labs: Basic Metabolic Panel:  Recent Labs Lab 12/19/12 0311  12/19/12 1545 12/20/12 0400 12/21/12 0420 12/21/12 2159 12/22/12 0432 12/22/12 1500  NA 141  < > 139 135 132* 137 133* 133*  K 4.3  < > 4.4 4.1 3.5 3.4* 3.4* 3.6  CL 110  < > 104 102 98  --  97 97  CO2 24  --  25 23 25   --  28 28  GLUCOSE 134*  < > 103* 114* 115*  --  115* 158*  BUN 14  < > 13 15 16   --  18 19  CREATININE 1.09  < > 1.12* 1.04 0.97  --  0.93 0.90  CALCIUM 7.3*  --  8.2* 8.4 8.5  --  8.3* 8.1*  MG 3.1*  --  2.4 2.2 1.9  --  1.9  --   PHOS 3.2  --   --  3.5 2.6  --  2.5  --   < > = values in this interval not displayed.  Liver Function Tests:  Recent Labs Lab 12/17/12 2135 12/19/12 0311 12/20/12 0400 12/21/12 0420 12/22/12 0432  AST 33 85* 92* 58* 39*  ALT 44* 17 23 20 18   ALKPHOS 83  --  49 110 85  BILITOT 0.5 3.1* 4.8* 6.0* 4.5*  PROT 6.8  --  4.6* 4.9* 4.9*  ALBUMIN 3.7  --  2.8* 2.7* 2.4*   No results found for this basename: LIPASE,  AMYLASE,  in the last 168 hours No results found for this basename: AMMONIA,  in the last 168 hours  CBC:  Recent Labs Lab 12/19/12 0311  12/20/12 0400 12/20/12 1605 12/20/12 2200 12/21/12 0420 12/21/12 2159 12/22/12 0432 12/22/12 1500  WBC 6.9  < > 11.1* 12.1* 13.1* 13.4*  --  14.1* 15.9*  NEUTROABS 5.4  --  9.0*  --   --  11.5*  --   --   --   HGB 6.5*  < > 8.8* 8.5* 8.0* 9.3* 9.2* 8.5* 9.6*  HCT 18.3*  < > 25.0* 24.3* 22.9* 26.7* 27.0* 24.8* 27.8*  MCV 85.5  < > 82.2 82.9 83.0 84.5  --  85.2 86.1  PLT 103*  < > 104* 94* 92* 94*  --  112* 114*  < > = values in this interval not displayed.  INR:  Recent Labs Lab 12/19/12 0812 12/19/12 1545 12/20/12 0400 12/21/12 0420 12/22/12 0432  INR 1.42 1.30 1.37 1.10 1.13    Other results:  EKG:   Imaging: Dg Chest Port 1 View  12/22/2012   CLINICAL DATA:  Ventricular assist device surgery.  EXAM: PORTABLE CHEST - 1 VIEW   COMPARISON:  12/21/2012  FINDINGS: Endotracheal tube tip 1.9 cm above the corona. AICD noted. Bilateral chest tubes are present without visible pneumothorax. Tubing associated with ventricular assist device is stable in positioning. Swan-Ganz catheter tip: Right pulmonary artery. Right IJ line tip: Cavoatrial junction. Low lung volumes are present, causing crowding of the pulmonary vasculature. Interstitial accentuation on the left, particularly in the perihilar region, similar to prior. Mild atelectasis at the left lung base.  IMPRESSION: Swan-Ganz catheter is been advanced into the right pulmonary arteries ; otherwise stable appearance of the chest.   Electronically Signed   By: Herbie Baltimore   On: 12/22/2012 07:45   Dg Chest Port 1 View  12/21/2012   *RADIOLOGY REPORT*  Clinical Data: Hypoxia  PORTABLE CHEST - 1 VIEW  Comparison: December 20, 2012  Findings tube and catheter positions are unchanged.  There is no appreciable pneumothorax.  There is mild left base atelectasis. Lungs are otherwise clear.  Heart is borderline prominent but stable.  Pulmonary vascularity is normal.  No adenopathy.  IMPRESSION: No appreciable change compared to 1 day prior.   Original Report Authenticated By: Bretta Bang, M.D.     Medications:     Scheduled Medications: . antiseptic oral rinse  15 mL Mouth Rinse QID  . aspirin      . [START ON 12/23/2012] aspirin  324 mg Per Tube Daily  . aspirin  300 mg Rectal Daily  . bisacodyl  10 mg Oral Daily   Or  . bisacodyl  10 mg Rectal Daily  . cefTAZidime (FORTAZ)  IV  1 g Intravenous Q8H  . chlorhexidine  15 mL Mouth Rinse BID  . docusate  200 mg Per Tube Daily  . [START ON 12/23/2012] feeding supplement (JEVITY 1.2 CAL)  1,000 mL Per OG tube Q24H  . feeding supplement  60 mL Per Tube TID  . insulin aspart  0-24 Units Subcutaneous Q4H  . metoCLOPramide (REGLAN) injection  10 mg Intravenous Q6H  . mupirocin ointment  1 application Topical BID  . pantoprazole  (PROTONIX) IV  40 mg Intravenous Q12H  . vancomycin  750 mg Intravenous Q12H    Infusions: . sodium chloride 20 mL/hr at 12/22/12 0400  . sodium chloride 10 mL/hr at 12/21/12 1900  . sodium chloride 250 mL (12/22/12  0400)  . amiodarone (NEXTERONE PREMIX) 360 mg/200 mL dextrose 30 mg/hr (12/22/12 1809)  . dexmedetomidine 0.2 mcg/kg/hr (12/22/12 1809)  . DOBUTamine 4 mcg/kg/min (12/22/12 1551)  . DOPamine 3 mcg/kg/min (12/22/12 0400)  . EPINEPHrine infusion 0.5 mcg/min (12/22/12 0400)  . fentaNYL infusion INTRAVENOUS 100 mcg/hr (12/22/12 1549)  . heparin 850 Units/hr (12/22/12 1653)  . lactated ringers 20 mL/hr at 12/22/12 0400  . midazolam (VERSED) infusion 4 mg/hr (12/22/12 1810)  . nitroGLYCERIN Stopped (12/18/12 1705)  . norepinephrine (LEVOPHED) Adult infusion 8 mcg/min (12/22/12 1157)  . 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 Sputum culture pending and antibiotics continued.  We'll continue biventricular support for the next 3 days and then assess RV native function early next week with bedside transthoracic 2-D echo.  Patient needs nutrition with prealbumin less than 10 and tube feedings have been initiated Jevity 1.2 at 30 cc per hour via OG.  I sat down with the patient's daughter in  the ICU waiting room and reviewed the results and events of the past 24 hours in the future plan of care and addressed all questions.    I reviewed the LVAD parameters from today, and compared the results to the patient's prior recorded data.  No programming changes were made.  The LVAD is functioning within specified parameters.  The patient performs LVAD self-test daily.  LVAD interrogation was negative for any significant power changes, alarms or PI events/speed drops.  LVAD equipment check completed and is in good working order.  Back-up equipment present.   LVAD education done on emergency procedures and precautions and reviewed exit site care.  Length of Stay: 14  [name] III,PETER 12/22/2012, 6:49 PM

## 2012-12-22 NOTE — Progress Notes (Signed)
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 EF 15-25% with mild to moderate RV dysfunction  Underwent HM II LVAD implant along with TV repair on 8/18. Once she returned to the ICU she had difficulties with RV and CVP rose to 20-25. She was taken back to the OR for RVAD support.   POD # 4  Got 1 PRBC yesterday am. Turned RVAD speed down yesterday after bolus of heparin to assess RV by ECHO. She still has mod/sev moderate RV dysfunction and the speed was turned back up to continue to rest the RV.  Weight down another 5 lbs, on lasix 3 mg/hr.  No LVAD or RVAD alarms. Still intubated and sedated, but following commands. On Dopa 3 mcg,  Dobutamine 4 mcg, Levophed 8 mcg, Neo 35 mcg and Epi 0.5 mcg. She is on heparin as well for RVAD. Starting to have low grade temps 100.5.  PA 25/19 (21) CVP 5 Co-ox 70%  RVAD 2600 RPM and 4.1 L  LVAD INTERROGATION:  HeartMate II LVAD:  Flow 3.7 liters/min, speed 8600, power 6.0, PI 4.0.  No PI events   Objective:    Vital Signs:   Temp:  [99.3 F (37.4 C)-100.8 F (38.2 C)] 100.2 F (37.9 C) (08/22 0600) Pulse Rate:  [82-89] 88 (08/22 0600) Resp:  [0-18] 15 (08/22 0600) BP: (78-92)/(64-69) 87/68 mmHg (08/22 0408) SpO2:  [97 %-100 %] 100 % (08/22 0600) FiO2 (%):  [40 %] 40 % (08/22 0600) Weight:  [183 lb 3.2 oz (83.1 kg)] 183 lb 3.2 oz (83.1 kg) (08/22 0500) Last BM Date: 12/15/12 Mean arterial Pressure 60-70s  Intake/Output:   Intake/Output Summary (Last 24 hours) at 12/22/12 0659 Last data filed at 12/22/12 0600  Gross per 24 hour  Intake 4498.36 ml  Output   5155 ml  Net -656.64 ml     Physical Exam: General: Intubated and sedated, following commands HEENT: normal Neck: supple. CVP 6 . Carotids 2+ bilat; no bruits. No lymphadenopathy or thryomegaly appreciated. Swan in place R IJ Cor: Mechanical heart sounds with LVAD hum present.; CT  in place RVAD tubes in place Lungs: CTA with bilateral crackles Abdomen: soft, nontender, ++distended. Hypoactive BS Driveline: C/D/I; securement device intact  Extremities: no cyanosis, clubbing, rash, 2+ edema Neuro: alert & orientedx3, cranial nerves grossly intact. moves all 4 extremities w/o difficulty. Affect pleasant  Telemetry: AV paced 80s  Labs: Basic Metabolic Panel:  Recent Labs Lab 12/19/12 0311 12/19/12 1004 12/19/12 1545 12/20/12 0400 12/21/12 0420 12/21/12 2159 12/22/12 0432  NA 141 142 139 135 132* 137 133*  K 4.3 4.6 4.4 4.1 3.5 3.4* 3.4*  CL 110 104 104 102 98  --  97  CO2 24  --  25 23 25   --  28  GLUCOSE 134* 103* 103* 114* 115*  --  115*  BUN 14 12 13 15 16   --  18  CREATININE 1.09 1.20* 1.12* 1.04 0.97  --  0.93  CALCIUM 7.3*  --  8.2* 8.4 8.5  --  8.3*  MG 3.1*  --  2.4 2.2 1.9  --  1.9  PHOS 3.2  --   --  3.5 2.6  --  2.5    Liver Function Tests:  Recent Labs Lab 12/17/12 2135 12/19/12 0311 12/20/12 0400 12/21/12 0420 12/22/12 0432  AST 33 85* 92* 58* 39*  ALT  44* 17 23 20 18   ALKPHOS 83  --  49 110 85  BILITOT 0.5 3.1* 4.8* 6.0* 4.5*  PROT 6.8  --  4.6* 4.9* 4.9*  ALBUMIN 3.7  --  2.8* 2.7* 2.4*   No results found for this basename: LIPASE, AMYLASE,  in the last 168 hours No results found for this basename: AMMONIA,  in the last 168 hours  CBC:  Recent Labs Lab 12/19/12 0311  12/20/12 0400 12/20/12 1605 12/20/12 2200 12/21/12 0420 12/21/12 2159 12/22/12 0432  WBC 6.9  < > 11.1* 12.1* 13.1* 13.4*  --  14.1*  NEUTROABS 5.4  --  9.0*  --   --  11.5*  --   --   HGB 6.5*  < > 8.8* 8.5* 8.0* 9.3* 9.2* 8.5*  HCT 18.3*  < > 25.0* 24.3* 22.9* 26.7* 27.0* 24.8*  MCV 85.5  < > 82.2 82.9 83.0 84.5  --  85.2  PLT 103*  < > 104* 94* 92* 94*  --  112*  < > = values in this interval not displayed.  INR:  Recent Labs Lab 12/19/12 0812 12/19/12 1545 12/20/12 0400 12/21/12 0420 12/22/12 0432  INR 1.42 1.30 1.37 1.10 1.13     Imaging: Dg Chest Port 1 View  12/21/2012   *RADIOLOGY REPORT*  Clinical Data: Hypoxia  PORTABLE CHEST - 1 VIEW  Comparison: December 20, 2012  Findings tube and catheter positions are unchanged.  There is no appreciable pneumothorax.  There is mild left base atelectasis. Lungs are otherwise clear.  Heart is borderline prominent but stable.  Pulmonary vascularity is normal.  No adenopathy.  IMPRESSION: No appreciable change compared to 1 day prior.   Original Report Authenticated By: Bretta Bang, M.D.     Medications:     Scheduled Medications: . antiseptic oral rinse  15 mL Mouth Rinse QID  . aspirin EC  325 mg Oral Daily   Or  . aspirin  324 mg Per Tube Daily   Or  . aspirin  300 mg Rectal Daily  . bisacodyl  10 mg Oral Daily   Or  . bisacodyl  10 mg Rectal Daily  . cefTAZidime (FORTAZ)  IV  1 g Intravenous Q8H  . chlorhexidine  15 mL Mouth Rinse BID  . docusate  200 mg Per Tube Daily  . feeding supplement (JEVITY 1.2 CAL)  1,000 mL Per OG tube Q24H  . feeding supplement  60 mL Per Tube TID  . insulin aspart  0-24 Units Subcutaneous Q4H  . magnesium sulfate 1 - 4 g bolus IVPB  2 g Intravenous Once  . metoCLOPramide (REGLAN) injection  10 mg Intravenous Q6H  . mupirocin ointment  1 application Topical BID  . pantoprazole (PROTONIX) IV  40 mg Intravenous Q12H  . sodium bicarbonate  50 mEq Intravenous Once  . vancomycin  1,000 mg Intravenous Q24H    Infusions: . sodium chloride 20 mL/hr at 12/22/12 0400  . sodium chloride 10 mL/hr at 12/21/12 1900  . sodium chloride 250 mL (12/22/12 0400)  . dexmedetomidine 0.2 mcg/kg/hr (12/22/12 0400)  . DOBUTamine 4 mcg/kg/min (12/22/12 0400)  . DOPamine 3 mcg/kg/min (12/22/12 0400)  . EPINEPHrine infusion 0.5 mcg/min (12/22/12 0400)  . fentaNYL infusion INTRAVENOUS 100 mcg/hr (12/22/12 0400)  . furosemide (LASIX) infusion 3 mg/hr (12/22/12 0400)  . heparin    . heparin 800 Units/hr (12/22/12 0400)  . insulin (NOVOLIN-R)  infusion Stopped (12/21/12 0005)  . lactated ringers 20 mL/hr at 12/22/12 0400  .  midazolam (VERSED) infusion 4 mg/hr (12/22/12 0600)  . nitroGLYCERIN Stopped (12/18/12 1705)  . norepinephrine (LEVOPHED) Adult infusion 8 mcg/min (12/22/12 0400)  . phenylephrine (NEO-SYNEPHRINE) Adult infusion 35 mcg/min (12/22/12 0400)  . vasopressin (PITRESSIN) infusion - *FOR SHOCK*      PRN Medications: diphenhydrAMINE, fentaNYL, heparin, midazolam, morphine injection, ondansetron (ZOFRAN) IV, ondansetron (ZOFRAN) IV, potassium chloride, sodium chloride, vasopressin (PITRESSIN) infusion - *FOR SHOCK*   Assessment:   1. Acute on chronic systolic HF with biventricular HF EF 10%  S/p HM II VAD with TV repair 12/18/12  2. RV failure  3. Cardiogenic shock  4. VT s/p Biotronik ICD  5. Chronic renal failure  6. 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 ABL anemia   Plan/Discussion:    POD#4 Stable overnight. Still remains on multiple pressors. RV mod dysfunction will continue RVAD to rest RV. MAPs stable. Hgb 8.5 and CT total 370 cc for 24 hrs. Heparin gtt managed by Dr. Maren Beach.  Starting to have low grade temps, will discuss with surgeon whether to order Tylenol Q 6hrs d/t rise in bilirubin.   Panda placed, however pump is broken and no tube feeds currently infusing. Hypoactive bowel sounds.  Weight down another 5 lbs, and CVP 5. Will stop lasix gtt and give IV pushes because we do not want CVP much lower in order to prevent RV suction events.   PICC attempted yesterday, however small veins will address whether we can get more access.  I reviewed the LVAD parameters from today, and compared the results to the patient's prior recorded data.  No programming changes were made.  The LVAD is functioning within specified parameters.  The nursing staff does LVAD self-test daily.  LVAD interrogation was negative for any significant power changes, alarms or PI events/speed drops.  LVAD equipment check completed and is in  good working order.  Back-up equipment present.   LVAD education done on emergency procedures and precautions and reviewed exit site care.  Ulla Potash B NP-C 7:24 AM  Patient seen and examined with Ulla Potash, NP. We discussed all aspects of the encounter. I agree with the assessment and plan as stated above. Remains with Bivad support. Failed RVAD wean yesterday.   Improving slowly today. PA tracing looks more pulsatile. Volume status improving with lasix gtt. Remains on multiple inotropes.   Will plan to try and wean epi and dobutamine today. Keep MAP > 70. Will try to keep CVP ~10. Would hold lasix gtt for now and use bolus lasix as needed to keep I/Os steadily negative. (13-15 pounds up from baseline) Anticoagulation per Dr. Donata Clay.   The patient is critically ill with multiple organ systems failure and requires high complexity decision making for assessment and support, frequent evaluation and titration of therapies, application of advanced monitoring technologies and extensive interpretation of multiple databases.   Critical Care Time devoted to patient care services described in this note is 35 Minutes.  [name] 2:47 PM     Length of Stay: 14 VAD Team Pager [phone] (7am - 7am) VAD Issue  Non-VAD issues HF Pager [phone]

## 2012-12-22 NOTE — Progress Notes (Signed)
Tube feeding pump malfunctioning and will not run.  SPD representitive unable to locate another pump in this building.  So for now I am unable to keep the tube feeds running.  I will continue to monitor.

## 2012-12-22 NOTE — Progress Notes (Signed)
ANTIBIOTIC / Anticoagulation CONSULT NOTE   Pharmacy Consult for vancomycin / heparin drip Indication: surgical prophylaxis  No Known Allergies  Patient Measurements: Height: 5' 2.99" (160 cm) Weight: 183 lb 3.2 oz (83.1 kg) IBW/kg (Calculated) : 52.38   Vital Signs: Temp: 100.9 F (38.3 C) (08/22 1030) Temp src: Core (Comment) (08/22 0600) BP: 92/68 mmHg (08/22 0739) Pulse Rate: 84 (08/22 1030) Intake/Output from previous day: 08/21 0701 - 08/22 0700 In: 4546.8 [I.V.:3492.5; NG/GT:404.3; IV Piggyback:650] Out: 5140 [Urine:4770; Chest Tube:370] Intake/Output from this shift: Total I/O In: 534.6 [I.V.:344.6; NG/GT:90; IV Piggyback:100] Out: 575 [Urine:555; Chest Tube:20]  Labs:  Recent Labs  12/20/12 0400  12/20/12 2200 12/21/12 0420 12/21/12 2159 12/22/12 0432  WBC 11.1*  < > 13.1* 13.4*  --  14.1*  HGB 8.8*  < > 8.0* 9.3* 9.2* 8.5*  PLT 104*  < > 92* 94*  --  112*  CREATININE 1.04  --   --  0.97  --  0.93  < > = values in this interval not displayed. Estimated Creatinine Clearance: 61.6 ml/min (by C-G formula based on Cr of 0.93).  Recent Labs  12/22/12 0923  VANCOTROUGH 8.6*     Microbiology: Recent Results (from the past 720 hour(s))  MRSA PCR SCREENING     Status: None   Collection Time    12/08/12  3:37 PM      Result Value Range Status   MRSA by PCR NEGATIVE  NEGATIVE Final   Comment:            The GeneXpert MRSA Assay (FDA     approved for NASAL specimens     only), is one component of a     comprehensive MRSA colonization     surveillance program. It is not     intended to diagnose MRSA     infection nor to guide or     monitor treatment for     MRSA infections.  SURGICAL PCR SCREEN     Status: Abnormal   Collection Time    12/13/12  6:10 PM      Result Value Range Status   MRSA, PCR NEGATIVE  NEGATIVE Final   Staphylococcus aureus POSITIVE (*) NEGATIVE Final   Comment:            The Xpert SA Assay (FDA     approved for NASAL  specimens     in patients over 42 years of age),     is one component of     a comprehensive surveillance     program.  Test performance has     been validated by [employer] for patients greater     than or equal to 1 year old.     It is not intended     to diagnose infection nor to     guide or monitor treatment.  CULTURE, RESPIRATORY (NON-EXPECTORATED)     Status: None   Collection Time    12/19/12  9:50 AM      Result Value Range Status   Specimen Description TRACHEAL ASPIRATE   Final   Special Requests Normal   Final   Gram Stain     Final   Value: ABUNDANT WBC PRESENT, PREDOMINANTLY PMN     NO SQUAMOUS EPITHELIAL CELLS SEEN     RARE GRAM POSITIVE COCCI     IN PAIRS     Performed at [facility]   Culture     Final  Value: NO GROWTH 2 DAYS     Performed at [facility]   Report Status 12/21/2012 FINAL   Final    Medical History: Past Medical History  Diagnosis Date  . Paroxysmal supraventricular tachycardia   . Hypokalemia   . Chronic systolic heart failure     a. 0/9811 Echo: EF 15%, mod to sev antlat and inflat HK, inf AK, mild to mod MR, mildly/mod reduced RV fxn, Mod TR, PASP .  [name] History of DVT (deep vein thrombosis)   . Dyslipidemia   . HTN (hypertension)   . Nonischemic cardiomyopathy     a. 06/2012 Echo: EF 15%;  b. 06/2012 Cath: nl except luminal irregs in LAD.  [name] Ventricular tachycardia     a. s/p ICD  . Goiter   . Implantable cardiac defibrillator- Biotronik     Single chamber  . [name] (left bundle branch block)     intermittent  . Palliative care patient     Assessment: 67 year old female with Class IV heart failure now s/p LVAD and TV repair.   She returned to the OR for temporary  RVAD.  Vancomycin post-op for surgical prophylaxis. Vancomycin dose increased 750mg  q12 8/22 as renal function has improved, temp spike 100.9 and trending up WBC 14 - will increase VT goal 15-20. Post op Zinacef was changed to [place] 8/18 to  broaden gram negative coverage. Anticoagulation was started 8/20 to prevent pump thrombosis.  Dosing is per TCTS with pharmacy following and discussing dose titration.  Goal aPtt driving dose titration is 70-100sec.  Also being followed is HL goal 0.2-0.3 and ACT 160-180.  Slight drop in h/h today with 1 ut PRBC being given.  Goal of Therapy:  Vancomycin trough goal 15-20 APTT 70-100 HL 0.2-0.3  Plan:  Vancomycin 750mg  q12 Check level at SS this weekend Pharmacy to continue to follow with Heart Failure Team    Leota Sauers Pharm.D. CPP, BCPS Clinical Pharmacist [phone] 12/22/2012 10:42 AM

## 2012-12-23 ENCOUNTER — Inpatient Hospital Stay (HOSPITAL_COMMUNITY): Payer: Commercial Managed Care - PPO

## 2012-12-23 LAB — CBC
HCT: 24.5 % — ABNORMAL LOW (ref 36.0–46.0)
HCT: 22.4 % — ABNORMAL LOW (ref 36.0–46.0)
Hemoglobin: 8.4 g/dL — ABNORMAL LOW (ref 12.0–15.0)
Hemoglobin: 7.9 g/dL — ABNORMAL LOW (ref 12.0–15.0)
MCH: 29.6 pg (ref 26.0–34.0)
MCH: 30.5 pg (ref 26.0–34.0)
MCHC: 34.3 g/dL (ref 30.0–36.0)
MCHC: 35.3 g/dL (ref 30.0–36.0)
MCV: 86.3 fL (ref 78.0–100.0)
MCV: 86.5 fL (ref 78.0–100.0)
Platelets: 117 10*3/uL — ABNORMAL LOW (ref 150–400)
Platelets: 168 10*3/uL (ref 150–400)
RBC: 2.84 MIL/uL — ABNORMAL LOW (ref 3.87–5.11)
RBC: 2.59 MIL/uL — ABNORMAL LOW (ref 3.87–5.11)
RDW: 16.6 % — ABNORMAL HIGH (ref 11.5–15.5)
RDW: 16.9 % — ABNORMAL HIGH (ref 11.5–15.5)
WBC: 14.4 10*3/uL — ABNORMAL HIGH (ref 4.0–10.5)
WBC: 20.3 10*3/uL — ABNORMAL HIGH (ref 4.0–10.5)

## 2012-12-23 LAB — BASIC METABOLIC PANEL
BUN: 19 mg/dL (ref 6–23)
CO2: 28 mEq/L (ref 19–32)
Calcium: 8.7 mg/dL (ref 8.4–10.5)
Chloride: 98 mEq/L (ref 96–112)
Creatinine, Ser: 0.77 mg/dL (ref 0.50–1.10)
GFR calc Af Amer: >90 mL/min (ref >90)
GFR calc non Af Amer: 86 mL/min — ABNORMAL LOW (ref >90)
Glucose, Bld: 122 mg/dL — ABNORMAL HIGH (ref 70–99)
Potassium: 4.2 mEq/L (ref 3.5–5.1)
Sodium: 133 mEq/L — ABNORMAL LOW (ref 135–145)

## 2012-12-23 LAB — POCT I-STAT 3, ART BLOOD GAS (G3+)
Acid-Base Excess: 4 mmol/L — ABNORMAL HIGH (ref 0.0–2.0)
Bicarbonate: 29.5 mEq/L — ABNORMAL HIGH (ref 20.0–24.0)
Patient temperature: 37.9
TCO2: 32 mmol/L (ref 0–100)
pCO2 arterial: 48.6 mmHg — ABNORMAL HIGH (ref 35.0–45.0)
pH, Arterial: 7.405 (ref 7.350–7.450)

## 2012-12-23 LAB — URINE CULTURE
Colony Count: NO GROWTH
Culture: NO GROWTH

## 2012-12-23 LAB — CARBOXYHEMOGLOBIN
Carboxyhemoglobin: 1.5 % (ref 0.5–1.5)
Carboxyhemoglobin: 1.7 % — ABNORMAL HIGH (ref 0.5–1.5)
Methemoglobin: 1.4 % (ref 0.0–1.5)
Methemoglobin: 2.1 % — ABNORMAL HIGH (ref 0.0–1.5)
O2 Saturation: 62 %
O2 Saturation: 64.8 %
Total hemoglobin: 8.3 g/dL — ABNORMAL LOW (ref 12.0–16.0)
Total hemoglobin: 7.8 g/dL — ABNORMAL LOW (ref 12.0–16.0)

## 2012-12-23 LAB — COMPREHENSIVE METABOLIC PANEL
ALT: 16 U/L (ref 0–35)
AST: 31 U/L (ref 0–37)
Albumin: 2.3 g/dL — ABNORMAL LOW (ref 3.5–5.2)
Alkaline Phosphatase: 94 U/L (ref 39–117)
BUN: 22 mg/dL (ref 6–23)
CO2: 29 mEq/L (ref 19–32)
Calcium: 8.2 mg/dL — ABNORMAL LOW (ref 8.4–10.5)
Chloride: 98 mEq/L (ref 96–112)
Creatinine, Ser: 0.76 mg/dL (ref 0.50–1.10)
GFR calc Af Amer: >90 mL/min (ref >90)
GFR calc non Af Amer: 87 mL/min — ABNORMAL LOW (ref >90)
Glucose, Bld: 126 mg/dL — ABNORMAL HIGH (ref 70–99)
Potassium: 3.7 mEq/L (ref 3.5–5.1)
Sodium: 133 mEq/L — ABNORMAL LOW (ref 135–145)
Total Bilirubin: 1.8 mg/dL — ABNORMAL HIGH (ref 0.3–1.2)
Total Protein: 4.8 g/dL — ABNORMAL LOW (ref 6.0–8.3)

## 2012-12-23 LAB — APTT
aPTT: 82 seconds — ABNORMAL HIGH (ref 24–37)
aPTT: 96 seconds — ABNORMAL HIGH (ref 24–37)

## 2012-12-23 LAB — HEPARIN LEVEL (UNFRACTIONATED): Heparin Unfractionated: 0.19 IU/mL — ABNORMAL LOW (ref 0.30–0.70)

## 2012-12-23 LAB — HEMOGLOBIN AND HEMATOCRIT, BLOOD
HCT: 23.3 % — ABNORMAL LOW (ref 36.0–46.0)
Hemoglobin: 7.9 g/dL — ABNORMAL LOW (ref 12.0–15.0)

## 2012-12-23 LAB — PREPARE RBC (CROSSMATCH)

## 2012-12-23 LAB — GLUCOSE, CAPILLARY
Glucose-Capillary: 100 mg/dL — ABNORMAL HIGH (ref 70–99)
Glucose-Capillary: 127 mg/dL — ABNORMAL HIGH (ref 70–99)
Glucose-Capillary: 129 mg/dL — ABNORMAL HIGH (ref 70–99)
Glucose-Capillary: 138 mg/dL — ABNORMAL HIGH (ref 70–99)
Glucose-Capillary: 149 mg/dL — ABNORMAL HIGH (ref 70–99)
Glucose-Capillary: 81 mg/dL (ref 70–99)

## 2012-12-23 LAB — POCT ACTIVATED CLOTTING TIME
Activated Clotting Time: 145 seconds
Activated Clotting Time: 145 seconds
Activated Clotting Time: 155 seconds

## 2012-12-23 LAB — PHOSPHORUS: Phosphorus: 2.1 mg/dL — ABNORMAL LOW (ref 2.3–4.6)

## 2012-12-23 LAB — MAGNESIUM: Magnesium: 2.3 mg/dL (ref 1.5–2.5)

## 2012-12-23 LAB — VANCOMYCIN, TROUGH: Vancomycin Tr: 8.9 ug/mL — ABNORMAL LOW (ref 10.0–20.0)

## 2012-12-23 LAB — CALCIUM, IONIZED: Calcium, Ion: 1.21 mmol/L (ref 1.13–1.30)

## 2012-12-23 LAB — LACTATE DEHYDROGENASE: LDH: 403 U/L — ABNORMAL HIGH (ref 94–250)

## 2012-12-23 MED ORDER — FAT EMULSION 20 % IV EMUL
240.0000 mL | INTRAVENOUS | Status: AC
  Administered 2012-12-23: 240 mL via INTRAVENOUS
  Filled 2012-12-23: qty 250

## 2012-12-23 MED ORDER — MAGIC MOUTHWASH
5.0000 mL | Freq: Four times a day (QID) | ORAL | Status: DC
  Administered 2012-12-23 – 2013-01-07 (×53): 5 mL via ORAL
  Filled 2012-12-23 (×67): qty 5

## 2012-12-23 MED ORDER — FUROSEMIDE 10 MG/ML IJ SOLN
20.0000 mg | Freq: Two times a day (BID) | INTRAMUSCULAR | Status: DC
  Administered 2012-12-23: 20 mg via INTRAVENOUS
  Filled 2012-12-23 (×2): qty 2

## 2012-12-23 MED ORDER — LIDOCAINE HCL (PF) 2 % IJ SOLN
100.0000 mg | Freq: Once | INTRAMUSCULAR | Status: DC
  Filled 2012-12-23: qty 5

## 2012-12-23 MED ORDER — POTASSIUM CHLORIDE 10 MEQ/50ML IV SOLN
10.0000 meq | INTRAVENOUS | Status: AC | PRN
  Administered 2012-12-23 (×3): 10 meq via INTRAVENOUS

## 2012-12-23 MED ORDER — TRAVASOL 10 % IV SOLN: INTRAVENOUS | Status: DC

## 2012-12-23 MED ORDER — JEVITY 1.2 CAL PO LIQD
1000.0000 mL | ORAL | Status: DC
  Administered 2012-12-23: 09:00:00 via OROGASTRIC
  Filled 2012-12-23 (×2): qty 1000

## 2012-12-23 MED ORDER — POTASSIUM CHLORIDE 10 MEQ/50ML IV SOLN
INTRAVENOUS | Status: AC
  Filled 2012-12-23: qty 150

## 2012-12-23 MED ORDER — LEVALBUTEROL HCL 1.25 MG/0.5ML IN NEBU
1.2500 mg | INHALATION_SOLUTION | Freq: Four times a day (QID) | RESPIRATORY_TRACT | Status: DC
  Administered 2012-12-23 – 2013-01-05 (×51): 1.25 mg via RESPIRATORY_TRACT
  Filled 2012-12-23 (×61): qty 0.5

## 2012-12-23 MED ORDER — NOREPINEPHRINE BITARTRATE 1 MG/ML IJ SOLN
5.0000 ug/min | INTRAVENOUS | Status: DC
  Administered 2012-12-23 – 2012-12-25 (×2): 8 ug/min via INTRAVENOUS
  Administered 2012-12-26: 10 ug/min via INTRAVENOUS
  Administered 2012-12-28: 8 ug/min via INTRAVENOUS
  Administered 2012-12-30: 4 ug/min via INTRAVENOUS
  Administered 2012-12-31: 8 ug/min via INTRAVENOUS
  Filled 2012-12-23 (×6): qty 16

## 2012-12-23 MED ORDER — TRACE MINERALS CR-CU-F-FE-I-MN-MO-SE-ZN IV SOLN
INTRAVENOUS | Status: AC
  Administered 2012-12-23: 18:00:00 via INTRAVENOUS
  Filled 2012-12-23: qty 1000

## 2012-12-23 MED ORDER — VANCOMYCIN HCL IN DEXTROSE 750-5 MG/150ML-% IV SOLN
750.0000 mg | Freq: Three times a day (TID) | INTRAVENOUS | Status: DC
  Administered 2012-12-23 – 2012-12-26 (×8): 750 mg via INTRAVENOUS
  Filled 2012-12-23 (×11): qty 150

## 2012-12-23 MED ORDER — POTASSIUM CHLORIDE 10 MEQ/50ML IV SOLN
10.0000 meq | INTRAVENOUS | Status: AC
  Administered 2012-12-23 (×2): 10 meq via INTRAVENOUS
  Filled 2012-12-23: qty 100

## 2012-12-23 MED ORDER — DEXTROSE 5 % IV SOLN
0.0000 ug/min | INTRAVENOUS | Status: DC
  Administered 2012-12-23: 25 ug/min via INTRAVENOUS
  Filled 2012-12-23: qty 4

## 2012-12-23 MED ORDER — LIDOCAINE HCL (CARDIAC) 20 MG/ML IV SOLN
100.0000 mg | Freq: Once | INTRAVENOUS | Status: AC
  Administered 2012-12-23: 100 mg via INTRAVENOUS

## 2012-12-23 MED ORDER — FUROSEMIDE 10 MG/ML IJ SOLN
40.0000 mg | Freq: Two times a day (BID) | INTRAMUSCULAR | Status: DC
  Administered 2012-12-23 – 2012-12-25 (×6): 40 mg via INTRAVENOUS
  Filled 2012-12-23 (×9): qty 4

## 2012-12-23 NOTE — Progress Notes (Signed)
HeartMate 2 Rounding Note  Subjective:    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 EF 15-25% with mild to moderate RV dysfunction  Underwent HM II LVAD implant along with TV repair on 8/18. Once she returned to the ICU she had difficulties with RV and CVP rose to 20-25. She was taken back to the OR for RVAD support.   Stable overnight. Thick productive sputum with low-grade fever. CVP 18. Weight down 5 pounds. Now off EPI. Weaning dobutamine. MAPs 80. Good pulsativity in PA tracing   RVAD 2600 RPM and 4.1 L  LVAD INTERROGATION:  HeartMate II LVAD:  Flow 4.4  liters/min, speed 8600, power 5.5, PI 4.0.  No PI events   Objective:    Vital Signs:   Temp:  [98.8 F (37.1 C)-100.9 F (38.3 C)] 100 F (37.8 C) (08/23 0700) Pulse Rate:  [62-84] 78 (08/23 0820) Resp:  [8-29] 16 (08/23 0820) BP: (87-100)/(68-72) 100/72 mmHg (08/23 0820) SpO2:  [94 %-100 %] 100 % (08/23 0820) FiO2 (%):  [40 %] 40 % (08/23 0820) Weight:  [80.9 kg (178 lb 5.6 oz)-83.1 kg (183 lb 3.2 oz)] 80.9 kg (178 lb 5.6 oz) (08/23 0500) Last BM Date: 12/15/12 Mean arterial Pressure 60-70s  Intake/Output:   Intake/Output Summary (Last 24 hours) at 12/23/12 0848 Last data filed at 12/23/12 0800  Gross per 24 hour  Intake   5467 ml  Output   4045 ml  Net   1422 ml     Physical Exam: General: Intubated and sedated, following commands HEENT: normal Neck: supple. CVP 18 . Carotids 2+ bilat; no bruits. No lymphadenopathy or thryomegaly appreciated. Swan in place R IJ Cor: Mechanical heart sounds with LVAD hum present.; CT in place RVAD tubes in place Lungs: CTA with bilateral crackles Abdomen: soft, nontender, mildly distended. Hypoactive BS Driveline: C/D/I; securement device intact  Extremities: no cyanosis, clubbing, rash, 1-2+ edema Neuro: alert & orientedx3, cranial nerves grossly intact. moves all 4 extremities w/o difficulty. Affect  pleasant  Telemetry: AV paced 80s  Labs: Basic Metabolic Panel:  Recent Labs Lab 12/19/12 0311  12/19/12 1545 12/20/12 0400 12/21/12 0420 12/21/12 2159 12/22/12 0432 12/22/12 1500 12/23/12 0402  NA 141  < > 139 135 132* 137 133* 133* 133*  K 4.3  < > 4.4 4.1 3.5 3.4* 3.4* 3.6 3.7  CL 110  < > 104 102 98  --  97 97 98  CO2 24  --  25 23 25   --  28 28 29   GLUCOSE 134*  < > 103* 114* 115*  --  115* 158* 126*  BUN 14  < > 13 15 16   --  18 19 22   CREATININE 1.09  < > 1.12* 1.04 0.97  --  0.93 0.90 0.76  CALCIUM 7.3*  --  8.2* 8.4 8.5  --  8.3* 8.1* 8.2*  MG 3.1*  --  2.4 2.2 1.9  --  1.9  --  2.3  PHOS 3.2  --   --  3.5 2.6  --  2.5  --  2.1*  < > = values in this interval not displayed.  Liver Function Tests:  Recent Labs Lab 12/17/12 2135 12/19/12 0311 12/20/12 0400 12/21/12 0420 12/22/12 0432 12/23/12 0402  AST 33 85* 92* 58* 39* 31  ALT 44* 17 23 20 18 16   ALKPHOS 83  --  49 110 85 94  BILITOT 0.5  3.1* 4.8* 6.0* 4.5* 1.8*  PROT 6.8  --  4.6* 4.9* 4.9* 4.8*  ALBUMIN 3.7  --  2.8* 2.7* 2.4* 2.3*   No results found for this basename: LIPASE, AMYLASE,  in the last 168 hours No results found for this basename: AMMONIA,  in the last 168 hours  CBC:  Recent Labs Lab 12/19/12 0311  12/20/12 0400  12/20/12 2200 12/21/12 0420 12/21/12 2159 12/22/12 0432 12/22/12 1500 12/23/12 0402  WBC 6.9  < > 11.1*  < > 13.1* 13.4*  --  14.1* 15.9* 14.4*  NEUTROABS 5.4  --  9.0*  --   --  11.5*  --   --   --   --   HGB 6.5*  < > 8.8*  < > 8.0* 9.3* 9.2* 8.5* 9.6* 8.4*  HCT 18.3*  < > 25.0*  < > 22.9* 26.7* 27.0* 24.8* 27.8* 24.5*  MCV 85.5  < > 82.2  < > 83.0 84.5  --  85.2 86.1 86.3  PLT 103*  < > 104*  < > 92* 94*  --  112* 114* 117*  < > = values in this interval not displayed.  INR:  Recent Labs Lab 12/19/12 0812 12/19/12 1545 12/20/12 0400 12/21/12 0420 12/22/12 0432  INR 1.42 1.30 1.37 1.10 1.13    Imaging: Dg Chest Port 1 View  12/23/2012   *RADIOLOGY  REPORT*  Clinical Data: Status post ventricular assist device placement  PORTABLE CHEST - 1 VIEW  Comparison: 12/22/2012  Findings: The position of the ventricular assist device is stable.  The endotracheal tube tip lies 1.5 cm above the carina.  A nasogastric tube passes below the diaphragm into the stomach.  The right internal jugular Swan-Ganz catheter has its tip in the right pulmonary artery.  An adjacent right internal jugular central venous catheter has its tip in the lower superior vena cava.  The left anterior chest wall AICD remains stable well-positioned.  Bilateral chest tubes are stable.  There is central vascular congestion and medial lung base atelectasis and perihilar atelectasis.  A small left pleural effusion is noted.  There is some mild airspace opacity along the left perihilar region that may reflect edema or atelectasis.  These findings are stable.  No gross pneumothorax on this semi-erect study.  IMPRESSION: Ventricular assist device and support apparatus are stable and well positioned.  Small left effusion.  Mild left perihilar edema/atelectasis. Persistent medial lung base atelectasis.  No change from previous day's study.   Original Report Authenticated By: Amie Portland, M.D.   Dg Chest Port 1 View  12/22/2012   CLINICAL DATA:  Ventricular assist device surgery.  EXAM: PORTABLE CHEST - 1 VIEW  COMPARISON:  12/21/2012  FINDINGS: Endotracheal tube tip 1.9 cm above the corona. AICD noted. Bilateral chest tubes are present without visible pneumothorax. Tubing associated with ventricular assist device is stable in positioning. Swan-Ganz catheter tip: Right pulmonary artery. Right IJ line tip: Cavoatrial junction. Low lung volumes are present, causing crowding of the pulmonary vasculature. Interstitial accentuation on the left, particularly in the perihilar region, similar to prior. Mild atelectasis at the left lung base.  IMPRESSION: Swan-Ganz catheter is been advanced into the right  pulmonary arteries ; otherwise stable appearance of the chest.   Electronically Signed   By: Herbie Baltimore   On: 12/22/2012 07:45     Medications:     Scheduled Medications: . antiseptic oral rinse  15 mL Mouth Rinse QID  . aspirin  324 mg Per  Tube Daily  . aspirin  300 mg Rectal Daily  . bisacodyl  10 mg Oral Daily   Or  . bisacodyl  10 mg Rectal Daily  . cefTAZidime (FORTAZ)  IV  1 g Intravenous Q8H  . chlorhexidine  15 mL Mouth Rinse BID  . docusate  200 mg Per Tube Daily  . feeding supplement (JEVITY 1.2 CAL)  1,000 mL Per OG tube Q24H  . feeding supplement  60 mL Per Tube TID  . furosemide  20 mg Intravenous Q12H  . insulin aspart  0-24 Units Subcutaneous Q4H  . levalbuterol  1.25 mg Nebulization Q6H  . mupirocin ointment  1 application Topical BID  . pantoprazole (PROTONIX) IV  40 mg Intravenous Q12H  . potassium chloride  10 mEq Intravenous Q1 Hr x 2  . vancomycin  750 mg Intravenous Q12H    Infusions: . sodium chloride 20 mL/hr at 12/23/12 0400  . sodium chloride 10 mL/hr at 12/21/12 1900  . sodium chloride 250 mL (12/23/12 0400)  . amiodarone (NEXTERONE PREMIX) 360 mg/200 mL dextrose 30 mg/hr (12/23/12 0800)  . dexmedetomidine 0.2 mcg/kg/hr (12/23/12 0800)  . DOBUTamine 2 mcg/kg/min (12/23/12 0800)  . DOPamine 3 mcg/kg/min (12/23/12 0400)  . fentaNYL infusion INTRAVENOUS 100 mcg/hr (12/23/12 0800)  . heparin 900 Units/hr (12/23/12 0830)  . lactated ringers 20 mL/hr at 12/23/12 0400  . midazolam (VERSED) infusion 4 mg/hr (12/23/12 0800)  . nitroGLYCERIN Stopped (12/18/12 1705)  . norepinephrine (LEVOPHED) Adult infusion 8 mcg/min (12/23/12 0800)  . phenylephrine (NEO-SYNEPHRINE) Adult infusion 35 mcg/min (12/23/12 0800)    PRN Medications: acetaminophen (TYLENOL) oral liquid 160 mg/5 mL, diphenhydrAMINE, fentaNYL, heparin, midazolam, morphine injection, ondansetron (ZOFRAN) IV, ondansetron (ZOFRAN) IV, [COMPLETED] potassium chloride, sodium  chloride   Assessment:   1. Acute on chronic systolic HF with biventricular HF EF 10%  S/p HM II VAD with TV repair 12/18/12  2. RV failure  3. Cardiogenic shock  4. VT s/p Biotronik ICD  5. Chronic renal failure  6. 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 ABL anemia   Plan/Discussion:    Improving slowly. Continue to wean dobutamine and neo. Diurese as tolerate. Keep MAP > 70. Will try to keep CVP ~10. Agree with ABX for possible PNA.  Anticoagulation per Dr. Donata Clay. Will evaluate RV with echo tomorrow. Once inotropes down consider weaning RVAD slowly. D/W Dr Donata Clay  The patient is critically ill with multiple organ systems failure and requires high complexity decision making for assessment and support, frequent evaluation and titration of therapies, application of advanced monitoring technologies and extensive interpretation of multiple databases.   Critical Care Time devoted to patient care services described in this note is 35 Minutes.  [name] 8:48 AM   Length of Stay: 15 VAD Team Pager [phone] (7am - 7am) VAD Issue  Non-VAD issues HF Pager [phone]

## 2012-12-23 NOTE — Progress Notes (Signed)
ANTIBIOTIC CONSULT NOTE - FOLLOW UP  Pharmacy Consult for Vancomycin  Indication: rule out pneumonia  No Known Allergies  Patient Measurements: Height: 5\' 9"  (175.3 cm) Weight: 178 lb 5.6 oz (80.9 kg) IBW/kg (Calculated) : 66.2  Vital Signs: Temp: 99 F (37.2 C) (08/23 2200) Temp src: Core (Comment) (08/23 2000) BP: 113/82 mmHg (08/23 1525) Pulse Rate: 84 (08/23 2200) Intake/Output from previous day: 08/22 0701 - 08/23 0700 In: 5553.5 [I.V.:3173.5; Blood:350; NG/GT:880; IV Piggyback:1150] Out: 4190 [Urine:3850; Chest Tube:340] Intake/Output from this shift: Total I/O In: 889.7 [I.V.:319.7; Blood:350; NG/GT:30; IV Piggyback:50; TPN:140] Out: 515 [Urine:515]  Labs:  Recent Labs  12/22/12 1500 12/23/12 0402 12/23/12 1445 12/23/12 1600 12/23/12 2115  WBC 15.9* 14.4*  --  20.3*  --   HGB 9.6* 8.4*  --  7.9* 7.9*  PLT 114* 117*  --  168  --   CREATININE 0.90 0.76 0.77  --   --    Estimated Creatinine Clearance: 79.8 ml/min (by C-G formula based on Cr of 0.77).  Recent Labs  12/22/12 0923 12/23/12 2115  VANCOTROUGH 8.6* 8.9*     Microbiology: Recent Results (from the past 720 hour(s))  MRSA PCR SCREENING     Status: None   Collection Time    12/08/12  3:37 PM      Result Value Range Status   MRSA by PCR NEGATIVE  NEGATIVE Final   Comment:            The GeneXpert MRSA Assay (FDA     approved for NASAL specimens     only), is one component of a     comprehensive MRSA colonization     surveillance program. It is not     intended to diagnose MRSA     infection nor to guide or     monitor treatment for     MRSA infections.  SURGICAL PCR SCREEN     Status: Abnormal   Collection Time    12/13/12  6:10 PM      Result Value Range Status   MRSA, PCR NEGATIVE  NEGATIVE Final   Staphylococcus aureus POSITIVE (*) NEGATIVE Final   Comment:            The Xpert SA Assay (FDA     approved for NASAL specimens     in patients over 41 years of age),     is one  component of     a comprehensive surveillance     program.  Test performance has     been validated by [employer] for patients greater     than or equal to 70 year old.     It is not intended     to diagnose infection nor to     guide or monitor treatment.  CULTURE, RESPIRATORY (NON-EXPECTORATED)     Status: None   Collection Time    12/19/12  9:50 AM      Result Value Range Status   Specimen Description TRACHEAL ASPIRATE   Final   Special Requests Normal   Final   Gram Stain     Final   Value: ABUNDANT WBC PRESENT, PREDOMINANTLY PMN     NO SQUAMOUS EPITHELIAL CELLS SEEN     RARE GRAM POSITIVE COCCI     IN PAIRS     Performed at [facility]   Culture     Final   Value: NO GROWTH 2 DAYS     Performed at  [facility]   Report Status 12/21/2012 FINAL   Final  URINE CULTURE     Status: None   Collection Time    12/22/12  9:30 AM      Result Value Range Status   Specimen Description URINE, CATHETERIZED   Final   Special Requests NONE   Final   Culture  Setup Time     Final   Value: 12/22/2012 13:49     Performed at [facility] Count     Final   Value: NO GROWTH     Performed at [facility]   Culture     Final   Value: NO GROWTH     Performed at [facility]   Report Status 12/23/2012 FINAL   Final    Anti-infectives   Start     Dose/Rate Route Frequency Ordered Stop   12/22/12 1000  vancomycin (VANCOCIN) IVPB 750 mg/150 ml premix     750 mg 150 mL/hr over 60 Minutes Intravenous Every 12 hours 12/22/12 0854     12/21/12 0600  cefTAZidime (FORTAZ) injection 1 g  Status:  Discontinued     1 g Intramuscular 3 times per day 12/20/12 1848 12/20/12 2002   12/20/12 2200  cefTAZidime (FORTAZ) 1 g in dextrose 5 % 50 mL IVPB     1 g 100 mL/hr over 30 Minutes Intravenous 3 times per day 12/20/12 1957     12/20/12 1800  vancomycin (VANCOCIN) IVPB 1000 mg/200 mL premix  Status:  Discontinued    Comments:  Continue until chest  is closed   1,000 mg 200 mL/hr over 60 Minutes Intravenous Every 24 hours 12/20/12 0745 12/22/12 0854   12/20/12 1600  cefUROXime (ZINACEF) 1.5 g in dextrose 5 % 50 mL IVPB  Status:  Discontinued     1.5 g 100 mL/hr over 30 Minutes Intravenous Every 12 hours 12/20/12 0745 12/20/12 1848   12/19/12 1800  vancomycin (VANCOCIN) IVPB 1000 mg/200 mL premix  Status:  Discontinued    Comments:  Continue until chest is closed   1,000 mg 200 mL/hr over 60 Minutes Intravenous Every 24 hours 12/18/12 2344 12/20/12 0745   12/19/12 1615  cefUROXime (ZINACEF) 1.5 g in dextrose 5 % 50 mL IVPB  Status:  Discontinued     1.5 g 100 mL/hr over 30 Minutes Intravenous Every 12 hours 12/19/12 1601 12/20/12 0745   12/19/12 0800  rifampin (RIFADIN) capsule 600 mg     600 mg Oral  Once 12/18/12 1544 12/19/12 1613   12/19/12 0600  fluconazole (DIFLUCAN) IVPB 400 mg     400 mg 100 mL/hr over 120 Minutes Intravenous  Once 12/18/12 1544 12/19/12 0816   12/19/12 0400  vancomycin (VANCOCIN) 1,250 mg in sodium chloride 0.9 % 250 mL IVPB  Status:  Discontinued     1,250 mg 166.7 mL/hr over 90 Minutes Intravenous To Surgery 12/18/12 2347 12/18/12 2356   12/19/12 0400  cefUROXime (ZINACEF) 1.5 g in dextrose 5 % 50 mL IVPB  Status:  Discontinued     1.5 g 100 mL/hr over 30 Minutes Intravenous To Surgery 12/18/12 2347 12/18/12 2356   12/19/12 0400  fluconazole (DIFLUCAN) IVPB 400 mg  Status:  Discontinued     400 mg 100 mL/hr over 120 Minutes Intravenous To Surgery 12/18/12 2347 12/18/12 2356   12/18/12 2300  cefUROXime (ZINACEF) 1.5 g in dextrose 5 % 50 mL IVPB  Status:  Discontinued     1.5 g  100 mL/hr over 30 Minutes Intravenous Every 12 hours 12/18/12 1544 12/18/12 1838   12/18/12 2200  vancomycin (VANCOCIN) IVPB 1000 mg/200 mL premix  Status:  Discontinued     1,000 mg 200 mL/hr over 60 Minutes Intravenous Every 24 hours 12/18/12 1616 12/18/12 1838   12/18/12 2145  vancomycin (VANCOCIN) IVPB 1000 mg/200 mL premix   Status:  Discontinued     1,000 mg 200 mL/hr over 60 Minutes Intravenous Every 12 hours 12/18/12 1544 12/18/12 1616   12/18/12 1845  cefUROXime (ZINACEF) 750 mg in dextrose 5 % 50 mL IVPB     750 mg 100 mL/hr over 30 Minutes Intravenous To Surgery 12/18/12 1828 12/19/12 1845   12/18/12 1845  vancomycin (VANCOCIN) powder 1,000 mg  Status:  Discontinued     1,000 mg Other To Surgery 12/18/12 1828 12/18/12 1838   12/18/12 1830  vancomycin (VANCOCIN) IVPB 1000 mg/200 mL premix     1,000 mg 200 mL/hr over 60 Minutes Intravenous  Once 12/18/12 1822 12/18/12 1930   12/18/12 1134  vancomycin (VANCOCIN) powder  Status:  Discontinued       As needed 12/18/12 1135 12/18/12 1411   12/18/12 0400  rifampin (RIFADIN) capsule 600 mg     600 mg Oral  Once 12/17/12 1924 12/18/12 0429   12/18/12 0400  vancomycin (VANCOCIN) 1,250 mg in sodium chloride 0.9 % 250 mL IVPB     1,250 mg 166.7 mL/hr over 90 Minutes Intravenous To Surgery 12/17/12 1048 12/18/12 0810   12/18/12 0400  cefUROXime (ZINACEF) 1.5 g in dextrose 5 % 50 mL IVPB     1.5 g 100 mL/hr over 30 Minutes Intravenous To Surgery 12/17/12 1048 12/18/12 1321   12/18/12 0400  cefUROXime (ZINACEF) 750 mg in dextrose 5 % 50 mL IVPB  Status:  Discontinued     750 mg 100 mL/hr over 30 Minutes Intravenous To Surgery 12/17/12 1047 12/18/12 1411   12/18/12 0400  fluconazole (DIFLUCAN) IVPB 400 mg     400 mg 100 mL/hr over 120 Minutes Intravenous To Surgery 12/17/12 1048 12/18/12 0825   12/18/12 0400  vancomycin (VANCOCIN) powder 1,000 mg  Status:  Discontinued     1,000 mg Other To Surgery 12/17/12 1047 12/18/12 1411      Assessment: 67 y.o. F on Vancomycin + Ceftazidime for r/o PNA with a SUBtherapeutic Vanc trough this evening (measured trough ~8.9, true trough likely ~9.5 mcg/ml, goal of 15-20 mcg/ml). Renal function remains stable. Will increase Vancomycin dose this evening to target goal range.   Goal of Therapy:  Vancomycin trough level 15-20  mcg/ml  Plan:  1. Adjust Vancomycin to 750 mg IV every 8 hours 2. Will continue to follow renal function, culture results, LOT, and antibiotic de-escalation plans   Georgina Pillion, PharmD, BCPS Clinical Pharmacist Pager: [phone] 12/23/2012 10:21 PM

## 2012-12-23 NOTE — Progress Notes (Signed)
. HeartMate 2 Rounding Note  Subjective:   Postop day #5 HeartMate 2 implantation with subsequent emergency centri- mag RVAD because of the RV dysfunction. Patient with stable hemodynamics on BiVAD support. Responsive but comfortable with IV sedation. Mean flows approximately 4 L on each device. No PI events on LVAD. Good urine output now on IV Lasix 40 twice a day . Heparin started with PTT goal 70-100 and  ACT goal 180. Heparin currently at 900 units per hour and measuring PTT every 8hours  Plan is to rest the RV with low CVP and remove fluid with gentle diuresis. Continue antibiotics- chest is open except for skin closure patient with a low-grade temperature and leukocytosis, sputum and urine and blood cultures pending   LVAD INTERROGATION:  HeartMate II LVAD:  Flow 4.1 liters/min, speed 8600 power 4.2, PI 4.8.   Objective:    Vital Signs:   Temp:  [98.8 F (37.1 C)-100.4 F (38 C)] 100.4 F (38 C) (08/23 0800) Pulse Rate:  [62-94] 91 (08/23 1125) Resp:  [11-29] 15 (08/23 1125) BP: (87-109)/(68-73) 104/72 mmHg (08/23 1125) SpO2:  [94 %-100 %] 100 % (08/23 1125) FiO2 (%):  [40 %] 40 % (08/23 1125) Weight:  [178 lb 5.6 oz (80.9 kg)] 178 lb 5.6 oz (80.9 kg) (08/23 0500) Last BM Date: 12/15/12 Mean arterial Pressure 84  Intake/Output:   Intake/Output Summary (Last 24 hours) at 12/23/12 1158 Last data filed at 12/23/12 1100  Gross per 24 hour  Intake 4878.45 ml  Output   4515 ml  Net 363.45 ml     Physical Exam: General: Intubated in ICU on right ventricular assist device, sedated and comfortable HEENT: normal Neck: supple. JVP normal  No lymphadenopathy or thryomegaly appreciated. Cor: Mechanical heart sounds with LVAD hum present. Lungs: clear Abdomen: soft, nontender, nondistended. No hepatosplenomegaly. No bruits or masses. Good bowel sounds. Extremities: no cyanosis, clubbing, rash, edema extremities warm Neuro: moves all 4 extremities but now is on sedation protocol  while on ventilator  Telemetry: AV paced 90 per minute  Labs: Basic Metabolic Panel:  Recent Labs Lab 12/19/12 0311  12/19/12 1545 12/20/12 0400 12/21/12 0420 12/21/12 2159 12/22/12 0432 12/22/12 1500 12/23/12 0402  NA 141  < > 139 135 132* 137 133* 133* 133*  K 4.3  < > 4.4 4.1 3.5 3.4* 3.4* 3.6 3.7  CL 110  < > 104 102 98  --  97 97 98  CO2 24  --  25 23 25   --  28 28 29   GLUCOSE 134*  < > 103* 114* 115*  --  115* 158* 126*  BUN 14  < > 13 15 16   --  18 19 22   CREATININE 1.09  < > 1.12* 1.04 0.97  --  0.93 0.90 0.76  CALCIUM 7.3*  --  8.2* 8.4 8.5  --  8.3* 8.1* 8.2*  MG 3.1*  --  2.4 2.2 1.9  --  1.9  --  2.3  PHOS 3.2  --   --  3.5 2.6  --  2.5  --  2.1*  < > = values in this interval not displayed.  Liver Function Tests:  Recent Labs Lab 12/17/12 2135 12/19/12 0311 12/20/12 0400 12/21/12 0420 12/22/12 0432 12/23/12 0402  AST 33 85* 92* 58* 39* 31  ALT 44* 17 23 20 18 16   ALKPHOS 83  --  49 110 85 94  BILITOT 0.5 3.1* 4.8* 6.0* 4.5* 1.8*  PROT 6.8  --  4.6* 4.9*  4.9* 4.8*  ALBUMIN 3.7  --  2.8* 2.7* 2.4* 2.3*   No results found for this basename: LIPASE, AMYLASE,  in the last 168 hours No results found for this basename: AMMONIA,  in the last 168 hours  CBC:  Recent Labs Lab 12/19/12 0311  12/20/12 0400  12/20/12 2200 12/21/12 0420 12/21/12 2159 12/22/12 0432 12/22/12 1500 12/23/12 0402  WBC 6.9  < > 11.1*  < > 13.1* 13.4*  --  14.1* 15.9* 14.4*  NEUTROABS 5.4  --  9.0*  --   --  11.5*  --   --   --   --   HGB 6.5*  < > 8.8*  < > 8.0* 9.3* 9.2* 8.5* 9.6* 8.4*  HCT 18.3*  < > 25.0*  < > 22.9* 26.7* 27.0* 24.8* 27.8* 24.5*  MCV 85.5  < > 82.2  < > 83.0 84.5  --  85.2 86.1 86.3  PLT 103*  < > 104*  < > 92* 94*  --  112* 114* 117*  < > = values in this interval not displayed.  INR:  Recent Labs Lab 12/19/12 0812 12/19/12 1545 12/20/12 0400 12/21/12 0420 12/22/12 0432  INR 1.42 1.30 1.37 1.10 1.13    Other results:  EKG:    Imaging: Dg Chest Port 1 View  12/23/2012   *RADIOLOGY REPORT*  Clinical Data: Status post ventricular assist device placement  PORTABLE CHEST - 1 VIEW  Comparison: 12/22/2012  Findings: The position of the ventricular assist device is stable.  The endotracheal tube tip lies 1.5 cm above the carina.  A nasogastric tube passes below the diaphragm into the stomach.  The right internal jugular Swan-Ganz catheter has its tip in the right pulmonary artery.  An adjacent right internal jugular central venous catheter has its tip in the lower superior vena cava.  The left anterior chest wall AICD remains stable well-positioned.  Bilateral chest tubes are stable.  There is central vascular congestion and medial lung base atelectasis and perihilar atelectasis.  A small left pleural effusion is noted.  There is some mild airspace opacity along the left perihilar region that may reflect edema or atelectasis.  These findings are stable.  No gross pneumothorax on this semi-erect study.  IMPRESSION: Ventricular assist device and support apparatus are stable and well positioned.  Small left effusion.  Mild left perihilar edema/atelectasis. Persistent medial lung base atelectasis.  No change from previous day's study.   Original Report Authenticated By: Amie Portland, M.D.   Dg Chest Port 1 View  12/22/2012   CLINICAL DATA:  Ventricular assist device surgery.  EXAM: PORTABLE CHEST - 1 VIEW  COMPARISON:  12/21/2012  FINDINGS: Endotracheal tube tip 1.9 cm above the corona. AICD noted. Bilateral chest tubes are present without visible pneumothorax. Tubing associated with ventricular assist device is stable in positioning. Swan-Ganz catheter tip: Right pulmonary artery. Right IJ line tip: Cavoatrial junction. Low lung volumes are present, causing crowding of the pulmonary vasculature. Interstitial accentuation on the left, particularly in the perihilar region, similar to prior. Mild atelectasis at the left lung base.   IMPRESSION: Swan-Ganz catheter is been advanced into the right pulmonary arteries ; otherwise stable appearance of the chest.   Electronically Signed   By: Herbie Baltimore   On: 12/22/2012 07:45     Medications:     Scheduled Medications: . antiseptic oral rinse  15 mL Mouth Rinse QID  . aspirin  324 mg Per Tube Daily  . aspirin  300  mg Rectal Daily  . bisacodyl  10 mg Oral Daily   Or  . bisacodyl  10 mg Rectal Daily  . cefTAZidime (FORTAZ)  IV  1 g Intravenous Q8H  . chlorhexidine  15 mL Mouth Rinse BID  . docusate  200 mg Per Tube Daily  . furosemide  40 mg Intravenous Q12H  . insulin aspart  0-24 Units Subcutaneous Q4H  . levalbuterol  1.25 mg Nebulization Q6H  . magic mouthwash  5 mL Oral QID  . mupirocin ointment  1 application Topical BID  . pantoprazole (PROTONIX) IV  40 mg Intravenous Q12H  . potassium chloride  10 mEq Intravenous Q1 Hr x 2  . vancomycin  750 mg Intravenous Q12H    Infusions: . sodium chloride 20 mL/hr at 12/23/12 0400  . sodium chloride 10 mL/hr at 12/21/12 1900  . sodium chloride 250 mL (12/23/12 0400)  . ADULT TPN    . amiodarone (NEXTERONE PREMIX) 360 mg/200 mL dextrose 30 mg/hr (12/23/12 0800)  . dexmedetomidine Stopped (12/23/12 0900)  . DOBUTamine Stopped (12/23/12 1100)  . DOPamine 3 mcg/kg/min (12/23/12 1000)  . fentaNYL infusion INTRAVENOUS 150 mcg/hr (12/23/12 1000)  . heparin 900 Units/hr (12/23/12 1000)  . lactated ringers 20 mL/hr at 12/23/12 0400  . midazolam (VERSED) infusion 4 mg/hr (12/23/12 1100)  . nitroGLYCERIN Stopped (12/18/12 1705)  . norepinephrine (LEVOPHED) Adult infusion    . phenylephrine (NEO-SYNEPHRINE) Adult infusion      PRN Medications: acetaminophen (TYLENOL) oral liquid 160 mg/5 mL, diphenhydrAMINE, fentaNYL, heparin, midazolam, morphine injection, ondansetron (ZOFRAN) IV, ondansetron (ZOFRAN) IV, sodium chloride   Assessment:  1 status post implantable LVAD with subsequent severe RV dysfunction requiring  right ventricular assist device 2 coagulopathy and bleeding requiring transfusion now improved 3 persistent RV dysfunction by 2-D echo during brief RVAD wean to 2 L per minute  4 therapeutic anticoagulation achieved with IV heparin drip for BIVAD Plan/Discussion:   Patient had episode of emesis from tube feeds but fortunately no evidence of aspiration. We'll place stomach to back to suction. Postpyloric tube not possible.   Patient needs nutrition with prealbumin less than 10 and tube feeds have been stopped secondary to ileus and emesis on ventilator. High risk of aspiration so we'll start TPN   Patient reviewed with Dr. Teena Dunk bone at the bedside. And plan on temporary RVAD wean tomorrow in preparation of possible RVAD removal Monday   I reviewed the LVAD parameters from today, and compared the results to the patient's prior recorded data.  No programming changes were made.  The LVAD is functioning within specified parameters.  The patient performs LVAD self-test daily.  LVAD interrogation was negative for any significant power changes, alarms or PI events/speed drops.  LVAD equipment check completed and is in good working order.  Back-up equipment present.   LVAD education done on emergency procedures and precautions and reviewed exit site care.  Length of Stay: 15  [name] III,PETER 12/23/2012, 11:58 AM

## 2012-12-23 NOTE — Progress Notes (Signed)
PARENTERAL NUTRITION CONSULT NOTE - INITIAL  Pharmacy Consult for TPN Indication: TF intolerance with emesis  No Known Allergies  Patient Measurements: Height: 5\' 9"  (175.3 cm) Weight: 178 lb 5.6 oz (80.9 kg) IBW/kg (Calculated) : 66.2 (22% above IBW)  Other Metrics Adjusted weight 72.1 kg  Nutritional weight 69.9 kg  Lean body weight 49.3 kg  BMI 26.3 kg/m2  BSA 1.98 m2   Vital Signs: Temp: 100.4 F (38 C) (08/23 0800) Temp src: Core (Comment) (08/23 0600) BP: 104/72 mmHg (08/23 1125) Pulse Rate: 91 (08/23 1125) Intake/Output from previous day: 08/22 0701 - 08/23 0700 In: 5553.5 [I.V.:3173.5; Blood:350; NG/GT:880; IV Piggyback:1150] Out: 4190 [Urine:3850; Chest Tube:340] Intake/Output from this shift: Total I/O In: 207.3 [I.V.:127.3; NG/GT:30; IV Piggyback:50] Out: 680 [Urine:360; Emesis/NG output:200; Chest Tube:120]  Labs:  Recent Labs  12/21/12 0420  12/22/12 0432  12/22/12 1500 12/22/12 1507 12/22/12 2245 12/23/12 0402 12/23/12 0658  WBC 13.4*  --  14.1*  --  15.9*  --   --  14.4*  --   HGB 9.3*  < > 8.5*  --  9.6*  --   --  8.4*  --   HCT 26.7*  < > 24.8*  --  27.8*  --   --  24.5*  --   PLT 94*  --  112*  --  114*  --   --  117*  --   APTT 81*  < >  --   < >  --  86* 90*  --  82*  INR 1.10  --  1.13  --   --   --   --   --   --   < > = values in this interval not displayed.   Recent Labs  12/21/12 0420  12/22/12 0432 12/22/12 1500 12/23/12 0402  NA 132*  < > 133* 133* 133*  K 3.5  < > 3.4* 3.6 3.7  CL 98  --  97 97 98  CO2 25  --  28 28 29   GLUCOSE 115*  --  115* 158* 126*  BUN 16  --  18 19 22   CREATININE 0.97  --  0.93 0.90 0.76  CALCIUM 8.5  --  8.3* 8.1* 8.2*  MG 1.9  --  1.9  --  2.3  PHOS 2.6  --  2.5  --  2.1*  PROT 4.9*  --  4.9*  --  4.8*  ALBUMIN 2.7*  --  2.4*  --  2.3*  AST 58*  --  39*  --  31  ALT 20  --  18  --  16  ALKPHOS 110  --  85  --  94  BILITOT 6.0*  --  4.5*  --  1.8*  BILIDIR 4.0*  --  2.9*  --   --   IBILI  2.0*  --  1.6*  --   --   PREALBUMIN 8.7*  --   --   --   --   < > = values in this interval not displayed. Estimated Creatinine Clearance: 79.8 ml/min (by C-G formula based on Cr of 0.76).    Recent Labs  12/23/12 0005 12/23/12 0348 12/23/12 0740  GLUCAP 154* 129* 100*    Medical History: Past Medical History  Diagnosis Date  . Paroxysmal supraventricular tachycardia   . Hypokalemia   . Chronic systolic heart failure     a. 05/6107 Echo: EF 15%, mod to sev antlat and inflat HK, inf AK, mild to  mod MR, mildly/mod reduced RV fxn, Mod TR, PASP .  [name] History of DVT (deep vein thrombosis)   . Dyslipidemia   . HTN (hypertension)   . Nonischemic cardiomyopathy     a. 06/2012 Echo: EF 15%;  b. 06/2012 Cath: nl except luminal irregs in LAD.  [name] Ventricular tachycardia     a. s/p ICD  . Goiter   . Implantable cardiac defibrillator- Biotronik     Single chamber  . [name] (left bundle branch block)     intermittent  . Palliative care patient    Insulin Requirements in the past 24 hours:  She has no history of diabetes but received 10 units of Novolog in the last 24 hours for CBG's of 100-154.  Current Nutrition:  She has been receiving Jevity 1.2 with 60ml of Prostat for a total of 1464kcal and 130 gm of protein.    Assessment: This is a 67 yo female with cardiac disease who recently underwent LVAD treatment.  She has severe heart failure and is undergoing gently duresis.  She had been receiving tube feeds for nutritional support but has been unable to tolerate with 200 cc of emesis.  TF's have been on hold and the plan is to use TPN short term to prevent nutritional deficit issues.    Nutritional Goals:  Her estimated needs are: (IBW 66kg) - 1650-1980 using normal needs of 25-30 kcal/kg/day (TBW 80.9kg) - 108-135 using 1.2-1.5g/kg/day  GI: She is slightly overweight with BMI of 26.3 but no noted GI issues.  Lytes: Electrolytes are within normal ranges - would strive to keep K+  > 4.0 and Mag. > 2.0. Renal:  Renal function is good with creat. of 0.76 and an estimated crcl of 78ml/min Pulm:   Currently intubated with some sedation but responsive to questions Cards:  Post-op day #4 of LVAD with BiVad support.  She is currently volume overloaded with ongoing diuresis.  Her BP is tenuous but no longer on pressor support. Hepatobil:  T.Bili slightly elevated, low albumin and total protein. Neuro:   No neurological deficits noted ID:   Spiked temp. Today (Vanc/Ceftazidime) - culture data non-revealing, had thick secretions yesterday but better today.  Best Practices:  Receiving IV heparin and IV PPI TPN Access: CVC Double Lumen Right Internal jugular - 12/18/12 >>  TPN day # 0  Plan:  1.  Will initiate TPN with Clinimix 5/15 that contains electrolytes at 74ml/hr and titrate upward as volume status allows. 2.  Start lipids at 2ml/hr 3.  Continue current SSI coverage 4.  F/U labs and adjust as needed.  Nadara Mustard, PharmD., MS Clinical Pharmacist Pager:  [phone] Thank you for allowing pharmacy to be part of this patients care team. 12/23/2012,11:32 AM

## 2012-12-23 NOTE — Progress Notes (Signed)
Vent changes made per MD. Current settings PRVC 450 14 40% +5. Pt tolerating at this time, recheck ABG in few hours. RT will continue to monitor.

## 2012-12-23 NOTE — Progress Notes (Signed)
ANTIBIOTIC CONSULT NOTE - FOLLOW UP  Pharmacy Consult for Vancomycin Indication: rule out pneumonia  No Known Allergies  Patient Measurements: Height: 5\' 9"  (175.3 cm) Weight: 178 lb 5.6 oz (80.9 kg) IBW/kg (Calculated) : 66.2  Vital Signs: Temp: 100.4 F (38 C) (08/23 0800) Temp src: Core (Comment) (08/23 0600) BP: 100/72 mmHg (08/23 0820) Pulse Rate: 78 (08/23 0820) Intake/Output from previous day: 08/22 0701 - 08/23 0700 In: 5553.5 [I.V.:3173.5; Blood:350; NG/GT:880; IV Piggyback:1150] Out: 4190 [Urine:3850; Chest Tube:340] Intake/Output from this shift: Total I/O In: 110 [I.V.:30; NG/GT:30; IV Piggyback:50] Out: 225 [Urine:135; Chest Tube:90]  Labs:  Recent Labs  12/22/12 0432 12/22/12 1500 12/23/12 0402  WBC 14.1* 15.9* 14.4*  HGB 8.5* 9.6* 8.4*  PLT 112* 114* 117*  CREATININE 0.93 0.90 0.76   Estimated Creatinine Clearance: 79.8 ml/min (by C-G formula based on Cr of 0.76).  Recent Labs  12/22/12 0923  VANCOTROUGH 8.6*     Microbiology: Recent Results (from the past 720 hour(s))  MRSA PCR SCREENING     Status: None   Collection Time    12/08/12  3:37 PM      Result Value Range Status   MRSA by PCR NEGATIVE  NEGATIVE Final   Comment:            The GeneXpert MRSA Assay (FDA     approved for NASAL specimens     only), is one component of a     comprehensive MRSA colonization     surveillance program. It is not     intended to diagnose MRSA     infection nor to guide or     monitor treatment for     MRSA infections.  SURGICAL PCR SCREEN     Status: Abnormal   Collection Time    12/13/12  6:10 PM      Result Value Range Status   MRSA, PCR NEGATIVE  NEGATIVE Final   Staphylococcus aureus POSITIVE (*) NEGATIVE Final   Comment:            The Xpert SA Assay (FDA     approved for NASAL specimens     in patients over 72 years of age),     is one component of     a comprehensive surveillance     program.  Test performance has     been validated  by [employer] for patients greater     than or equal to 69 year old.     It is not intended     to diagnose infection nor to     guide or monitor treatment.  CULTURE, RESPIRATORY (NON-EXPECTORATED)     Status: None   Collection Time    12/19/12  9:50 AM      Result Value Range Status   Specimen Description TRACHEAL ASPIRATE   Final   Special Requests Normal   Final   Gram Stain     Final   Value: ABUNDANT WBC PRESENT, PREDOMINANTLY PMN     NO SQUAMOUS EPITHELIAL CELLS SEEN     RARE GRAM POSITIVE COCCI     IN PAIRS     Performed at [facility]   Culture     Final   Value: NO GROWTH 2 DAYS     Performed at [facility]   Report Status 12/21/2012 FINAL   Final    Anti-infectives   Start     Dose/Rate Route Frequency Ordered Stop  12/22/12 1000  vancomycin (VANCOCIN) IVPB 750 mg/150 ml premix     750 mg 150 mL/hr over 60 Minutes Intravenous Every 12 hours 12/22/12 0854     12/21/12 0600  cefTAZidime (FORTAZ) injection 1 g  Status:  Discontinued     1 g Intramuscular 3 times per day 12/20/12 1848 12/20/12 2002   12/20/12 2200  cefTAZidime (FORTAZ) 1 g in dextrose 5 % 50 mL IVPB     1 g 100 mL/hr over 30 Minutes Intravenous 3 times per day 12/20/12 1957     12/20/12 1800  vancomycin (VANCOCIN) IVPB 1000 mg/200 mL premix  Status:  Discontinued    Comments:  Continue until chest is closed   1,000 mg 200 mL/hr over 60 Minutes Intravenous Every 24 hours 12/20/12 0745 12/22/12 0854   12/20/12 1600  cefUROXime (ZINACEF) 1.5 g in dextrose 5 % 50 mL IVPB  Status:  Discontinued     1.5 g 100 mL/hr over 30 Minutes Intravenous Every 12 hours 12/20/12 0745 12/20/12 1848   12/19/12 1800  vancomycin (VANCOCIN) IVPB 1000 mg/200 mL premix  Status:  Discontinued    Comments:  Continue until chest is closed   1,000 mg 200 mL/hr over 60 Minutes Intravenous Every 24 hours 12/18/12 2344 12/20/12 0745   12/19/12 1615  cefUROXime (ZINACEF) 1.5 g in dextrose 5 % 50 mL IVPB   Status:  Discontinued     1.5 g 100 mL/hr over 30 Minutes Intravenous Every 12 hours 12/19/12 1601 12/20/12 0745   12/19/12 0800  rifampin (RIFADIN) capsule 600 mg     600 mg Oral  Once 12/18/12 1544 12/19/12 1613   12/19/12 0600  fluconazole (DIFLUCAN) IVPB 400 mg     400 mg 100 mL/hr over 120 Minutes Intravenous  Once 12/18/12 1544 12/19/12 0816   12/19/12 0400  vancomycin (VANCOCIN) 1,250 mg in sodium chloride 0.9 % 250 mL IVPB  Status:  Discontinued     1,250 mg 166.7 mL/hr over 90 Minutes Intravenous To Surgery 12/18/12 2347 12/18/12 2356   12/19/12 0400  cefUROXime (ZINACEF) 1.5 g in dextrose 5 % 50 mL IVPB  Status:  Discontinued     1.5 g 100 mL/hr over 30 Minutes Intravenous To Surgery 12/18/12 2347 12/18/12 2356   12/19/12 0400  fluconazole (DIFLUCAN) IVPB 400 mg  Status:  Discontinued     400 mg 100 mL/hr over 120 Minutes Intravenous To Surgery 12/18/12 2347 12/18/12 2356   12/18/12 2300  cefUROXime (ZINACEF) 1.5 g in dextrose 5 % 50 mL IVPB  Status:  Discontinued     1.5 g 100 mL/hr over 30 Minutes Intravenous Every 12 hours 12/18/12 1544 12/18/12 1838   12/18/12 2200  vancomycin (VANCOCIN) IVPB 1000 mg/200 mL premix  Status:  Discontinued     1,000 mg 200 mL/hr over 60 Minutes Intravenous Every 24 hours 12/18/12 1616 12/18/12 1838   12/18/12 2145  vancomycin (VANCOCIN) IVPB 1000 mg/200 mL premix  Status:  Discontinued     1,000 mg 200 mL/hr over 60 Minutes Intravenous Every 12 hours 12/18/12 1544 12/18/12 1616   12/18/12 1845  cefUROXime (ZINACEF) 750 mg in dextrose 5 % 50 mL IVPB     750 mg 100 mL/hr over 30 Minutes Intravenous To Surgery 12/18/12 1828 12/19/12 1845   12/18/12 1845  vancomycin (VANCOCIN) powder 1,000 mg  Status:  Discontinued     1,000 mg Other To Surgery 12/18/12 1828 12/18/12 1838   12/18/12 1830  vancomycin (VANCOCIN) IVPB 1000 mg/200 mL  premix     1,000 mg 200 mL/hr over 60 Minutes Intravenous  Once 12/18/12 1822 12/18/12 1930   12/18/12 1134   vancomycin (VANCOCIN) powder  Status:  Discontinued       As needed 12/18/12 1135 12/18/12 1411   12/18/12 0400  rifampin (RIFADIN) capsule 600 mg     600 mg Oral  Once 12/17/12 1924 12/18/12 0429   12/18/12 0400  vancomycin (VANCOCIN) 1,250 mg in sodium chloride 0.9 % 250 mL IVPB     1,250 mg 166.7 mL/hr over 90 Minutes Intravenous To Surgery 12/17/12 1048 12/18/12 0810   12/18/12 0400  cefUROXime (ZINACEF) 1.5 g in dextrose 5 % 50 mL IVPB     1.5 g 100 mL/hr over 30 Minutes Intravenous To Surgery 12/17/12 1048 12/18/12 1321   12/18/12 0400  cefUROXime (ZINACEF) 750 mg in dextrose 5 % 50 mL IVPB  Status:  Discontinued     750 mg 100 mL/hr over 30 Minutes Intravenous To Surgery 12/17/12 1047 12/18/12 1411   12/18/12 0400  fluconazole (DIFLUCAN) IVPB 400 mg     400 mg 100 mL/hr over 120 Minutes Intravenous To Surgery 12/17/12 1048 12/18/12 0825   12/18/12 0400  vancomycin (VANCOCIN) powder 1,000 mg  Status:  Discontinued     1,000 mg Other To Surgery 12/17/12 1047 12/18/12 1411     66 yo F admitted 12/08/2012  s/p LVAD and Tricuspid Valve repair on 8/18>> taken back to OR for RVAD centrimag.  Pharmacy consulted to dose vancomycin for possible PNA.  PMH: severe HTN, [name], VT s/p ICD (2009) and CHF d/t NICM   Events:  New TPN to start, Off epi, continue to wean ggts  AC: - Centrimag- heparin inc to 900 uts/hr per PVT  aPTT goal 70-100; ACT q4h as well; HL goal 0.2-0.3.  H/H low but stable, bleeding stable per RN  ID: Post op, empiric PNA Tmax 100.4, WBC trend up 14.4,  zinacef 8/18>>8/20  fortaz 8/20 >>  vanc 8/18>> 750 q12  trough prior to 4th dose 8.23 Cultures: 8/19 TA 8/23 urine 8/23 TA  CV: EF 20%, LVAD 8/18, TV repair, RVAD,  Coox 62%, CVP 18, wt 178 down 5lb 5513/4240 2.1 ml/kg/hr Amio 30mg /hr dob 1 mcg/kg/min dopamine 52mcg/kg/min  Levo 8 mcg/min Neo 35 mcg/min Lasix 40 IV q12h dig level on 8/15 - 0.4 -stopped 8/20 ASA 324, atorv 20  Endo: No history noted -- CBG's  89-118; insulin gtt GI/Nutrition: NPO  Neuro: Sedated on vent  fentanyl 150 mcg/hr, versed 6mg /hr CPOT 3, RASS -2  Renal: SCr 0.76 (CrCl ~75-80) K 3.7 -- Mg 2.3- Pulm: SIMV 14/5 40% FIO2  Heme/Onc: Post op anemia: Last PRBCs 8/22 PTA Med Issues: b12, lisinopril, carvedilol   Goal of Therapy:  Vancomycin trough level 15-20 mcg/ml  Plan:  1. Vancomycin trough prior to 10p dose tonight 2.Follow up SCr, UOP, cultures, clinical course and adjust as clinically indicated. 3. Anticoagulation per TCTS   Thank you for allowing pharmacy to be a part of this patients care team.  Lovenia Kim Pharm.D., BCPS Clinical Pharmacist 12/23/2012 10:20 AM Pager: [phone] Phone: [phone]

## 2012-12-23 NOTE — Progress Notes (Signed)
NUTRITION FOLLOW-UP  DOCUMENTATION CODES Per approved criteria  -Not Applicable   INTERVENTION: 1.  Parenteral nutrition;  Management per PharmD.  Note potential difficulty in meeting 100% estimated needs given limitations with pre-mixed TPN.   2.  Enteral nutrition; if pt source of emesis/hypoactive bowel determined and resume of enteral nutrition is appropriate, recommend post-pyloric tube placement to decrease risk of aspiration. Recommend Vital 1.2 @ 10 mL/hr trophic feeds.  RD to continue to follow nutrition care plan.  NUTRITION DIAGNOSIS: Inadequate oral intake now related to inability to eat as evidenced by NPO status. Ongoing.  Goal: Intake to meet >90% of estimated nutrition needs. Unmet.  Monitor:  Vent settings, labs, weight trend, post-op healing and recovery, tolerance of nutrition support  ASSESSMENT: Underwent the following 8/18: INSERTION OF IMPLANTABLE LEFT VENTRICULAR ASSIST DEVICE  INTRAOPERATIVE TRANSESOPHAGEAL ECHOCARDIOGRAM  TRICUSPID VALVE REPAIR  INSERTION OF IMPLANTABLE RIGHT VENTRICULAR ASSIST DEVICE   Pt found to have R ventricular dysfunction s/p LVAD implantation, required trip to OR later that evening for placement of RVAD. Pt now with LVAD and RVAD.   Patient without bowel sounds yesterday now with vomiting today.  Pt has not had a bowel movement since (8/15) despite colace, dulcolax, and reglan.  TFs placed on hold and TPN ordered.  Weight beginning to trend back down, pt is +24 lb.  Remains intubated on ventilator support.  MV: 6.5 L/min Temp:Temp (24hrs), Avg:99.7 F (37.6 C), Min:98.8 F (37.1 C), Max:101.3 F (38.5 C) Temp elevated.   Propofol: none  Height: Ht Readings from Last 1 Encounters:  12/22/12 5\' 9"  (1.753 m)    Weight: Wt Readings from Last 1 Encounters:  12/23/12 178 lb 5.6 oz (80.9 kg)  Admission wt: 165 lb Current wt is up 30 lb.  BMI:  Body mass index is 29.3 kg/(m^2) - using admission weight.  Estimated  Nutritional Needs: Kcal: 1626 Protein: 112 - 131 gm Fluid: 1.5 - 2 L  Skin:  2+ generalized edema Abdomen incision Chest incision - with wound VAC  Diet Order: NPO  EDUCATION NEEDS: -Education needs addressed. Provided many handouts on heart healthy eating, including label reading tips per patient request on 8/15.   Intake/Output Summary (Last 24 hours) at 12/23/12 1345 Last data filed at 12/23/12 1300  Gross per 24 hour  Intake 4793.95 ml  Output   4365 ml  Net 428.95 ml    Last BM: 8/15  Labs:   Recent Labs Lab 12/21/12 0420  12/22/12 0432 12/22/12 1500 12/23/12 0402  NA 132*  < > 133* 133* 133*  K 3.5  < > 3.4* 3.6 3.7  CL 98  --  97 97 98  CO2 25  --  28 28 29   BUN 16  --  18 19 22   CREATININE 0.97  --  0.93 0.90 0.76  CALCIUM 8.5  --  8.3* 8.1* 8.2*  MG 1.9  --  1.9  --  2.3  PHOS 2.6  --  2.5  --  2.1*  GLUCOSE 115*  --  115* 158* 126*  < > = values in this interval not displayed.  CBG (last 3)   Recent Labs  12/23/12 0348 12/23/12 0740 12/23/12 1132  GLUCAP 129* 100* 127*    Scheduled Meds: . antiseptic oral rinse  15 mL Mouth Rinse QID  . aspirin  324 mg Per Tube Daily  . aspirin  300 mg Rectal Daily  . bisacodyl  10 mg Oral Daily   Or  . bisacodyl  10 mg Rectal Daily  . cefTAZidime (FORTAZ)  IV  1 g Intravenous Q8H  . chlorhexidine  15 mL Mouth Rinse BID  . docusate  200 mg Per Tube Daily  . furosemide  40 mg Intravenous Q12H  . insulin aspart  0-24 Units Subcutaneous Q4H  . levalbuterol  1.25 mg Nebulization Q6H  . magic mouthwash  5 mL Oral QID  . mupirocin ointment  1 application Topical BID  . pantoprazole (PROTONIX) IV  40 mg Intravenous Q12H  . vancomycin  750 mg Intravenous Q12H    Continuous Infusions: . sodium chloride 20 mL/hr at 12/23/12 0400  . sodium chloride 10 mL/hr at 12/21/12 1900  . sodium chloride 250 mL (12/23/12 1100)  . amiodarone (NEXTERONE PREMIX) 360 mg/200 mL dextrose 30 mg/hr (12/23/12 1300)  .  dexmedetomidine Stopped (12/23/12 0900)  . DOBUTamine Stopped (12/23/12 1100)  . DOPamine 3 mcg/kg/min (12/23/12 1300)  . Marland KitchenTPN (CLINIMIX-E) Adult     And  . fat emulsion    . fentaNYL infusion INTRAVENOUS 200 mcg/hr (12/23/12 1300)  . heparin 900 Units/hr (12/23/12 1300)  . lactated ringers 20 mL/hr at 12/23/12 0400  . midazolam (VERSED) infusion 6 mg/hr (12/23/12 1300)  . nitroGLYCERIN Stopped (12/18/12 1705)  . norepinephrine (LEVOPHED) Adult infusion 8 mcg/min (12/23/12 1300)  . phenylephrine (NEO-SYNEPHRINE) Adult infusion 35 mcg/min (12/23/12 1300)    Loyce Dys, MS RD LDN Clinical Inpatient Dietitian Pager: [phone] Weekend/After hours pager: [phone]

## 2012-12-24 ENCOUNTER — Inpatient Hospital Stay (HOSPITAL_COMMUNITY): Payer: Commercial Managed Care - PPO

## 2012-12-24 DIAGNOSIS — I509 Heart failure, unspecified: Secondary | ICD-10-CM

## 2012-12-24 LAB — COMPREHENSIVE METABOLIC PANEL
ALT: 15 U/L (ref 0–35)
AST: 28 U/L (ref 0–37)
Albumin: 1.9 g/dL — ABNORMAL LOW (ref 3.5–5.2)
Alkaline Phosphatase: 80 U/L (ref 39–117)
BUN: 13 mg/dL (ref 6–23)
CO2: 27 mEq/L (ref 19–32)
Calcium: 8.1 mg/dL — ABNORMAL LOW (ref 8.4–10.5)
Chloride: 94 mEq/L — ABNORMAL LOW (ref 96–112)
Creatinine, Ser: <0.2 mg/dL — ABNORMAL LOW (ref 0.50–1.10)
Glucose, Bld: 301 mg/dL — ABNORMAL HIGH (ref 70–99)
Potassium: 4.1 mEq/L (ref 3.5–5.1)
Sodium: 130 mEq/L — ABNORMAL LOW (ref 135–145)
Total Bilirubin: 1.2 mg/dL (ref 0.3–1.2)
Total Protein: 4.3 g/dL — ABNORMAL LOW (ref 6.0–8.3)

## 2012-12-24 LAB — CBC
HCT: 22.5 % — ABNORMAL LOW (ref 36.0–46.0)
Hemoglobin: 7.5 g/dL — ABNORMAL LOW (ref 12.0–15.0)
MCH: 29.2 pg (ref 26.0–34.0)
MCHC: 33.3 g/dL (ref 30.0–36.0)
MCV: 87.5 fL (ref 78.0–100.0)
Platelets: 158 10*3/uL (ref 150–400)
RBC: 2.57 MIL/uL — ABNORMAL LOW (ref 3.87–5.11)
RDW: 16.6 % — ABNORMAL HIGH (ref 11.5–15.5)
WBC: 17.2 10*3/uL — ABNORMAL HIGH (ref 4.0–10.5)

## 2012-12-24 LAB — APTT
aPTT: 107 seconds — ABNORMAL HIGH (ref 24–37)
aPTT: 149 seconds — ABNORMAL HIGH (ref 24–37)
aPTT: 71 seconds — ABNORMAL HIGH (ref 24–37)
aPTT: 79 seconds — ABNORMAL HIGH (ref 24–37)
aPTT: >200 seconds (ref 24–37)

## 2012-12-24 LAB — DIFFERENTIAL
Basophils Absolute: 0 10*3/uL (ref 0.0–0.1)
Basophils Relative: 0 % (ref 0–1)
Eosinophils Absolute: 0.3 10*3/uL (ref 0.0–0.7)
Eosinophils Relative: 2 % (ref 0–5)
Lymphocytes Relative: 11 % — ABNORMAL LOW (ref 12–46)
Lymphs Abs: 1.9 10*3/uL (ref 0.7–4.0)
Monocytes Absolute: 2.1 10*3/uL — ABNORMAL HIGH (ref 0.1–1.0)
Monocytes Relative: 12 % (ref 3–12)
Neutro Abs: 12.9 10*3/uL — ABNORMAL HIGH (ref 1.7–7.7)
Neutrophils Relative %: 75 % (ref 43–77)

## 2012-12-24 LAB — CARBOXYHEMOGLOBIN
Carboxyhemoglobin: 1.9 % — ABNORMAL HIGH (ref 0.5–1.5)
Carboxyhemoglobin: 1.6 % — ABNORMAL HIGH (ref 0.5–1.5)
Methemoglobin: 1.4 % (ref 0.0–1.5)
Methemoglobin: 2.1 % — ABNORMAL HIGH (ref 0.0–1.5)
O2 Saturation: 72.8 %
O2 Saturation: 61.4 %
Total hemoglobin: 7.5 g/dL — ABNORMAL LOW (ref 12.0–16.0)
Total hemoglobin: 9.4 g/dL — ABNORMAL LOW (ref 12.0–16.0)

## 2012-12-24 LAB — POCT ACTIVATED CLOTTING TIME
Activated Clotting Time: 196 seconds
Activated Clotting Time: 165 seconds
Activated Clotting Time: 150 seconds
Activated Clotting Time: 155 seconds

## 2012-12-24 LAB — HEMOGLOBIN AND HEMATOCRIT, BLOOD
HCT: 26.2 % — ABNORMAL LOW (ref 36.0–46.0)
Hemoglobin: 9 g/dL — ABNORMAL LOW (ref 12.0–15.0)

## 2012-12-24 LAB — PREPARE RBC (CROSSMATCH)

## 2012-12-24 LAB — BASIC METABOLIC PANEL
BUN: 14 mg/dL (ref 6–23)
CO2: 29 mEq/L (ref 19–32)
Calcium: 8.3 mg/dL — ABNORMAL LOW (ref 8.4–10.5)
Chloride: 96 mEq/L (ref 96–112)
Creatinine, Ser: 0.62 mg/dL (ref 0.50–1.10)
GFR calc Af Amer: >90 mL/min (ref >90)
GFR calc non Af Amer: >90 mL/min (ref >90)
Glucose, Bld: 155 mg/dL — ABNORMAL HIGH (ref 70–99)
Potassium: 3.8 mEq/L (ref 3.5–5.1)
Sodium: 131 mEq/L — ABNORMAL LOW (ref 135–145)

## 2012-12-24 LAB — HEPARIN LEVEL (UNFRACTIONATED): Heparin Unfractionated: 0.16 IU/mL — ABNORMAL LOW (ref 0.30–0.70)

## 2012-12-24 LAB — POCT I-STAT 4, (NA,K, GLUC, HGB,HCT)
HCT: 26 % — ABNORMAL LOW (ref 36.0–46.0)
Hemoglobin: 8.8 g/dL — ABNORMAL LOW (ref 12.0–15.0)
Sodium: 133 mEq/L — ABNORMAL LOW (ref 135–145)

## 2012-12-24 LAB — PREALBUMIN: Prealbumin: 6.2 mg/dL — ABNORMAL LOW (ref 17.0–34.0)

## 2012-12-24 LAB — VANCOMYCIN, TROUGH: Vancomycin Tr: 18 ug/mL (ref 10.0–20.0)

## 2012-12-24 LAB — PHOSPHORUS: Phosphorus: 1.4 mg/dL — ABNORMAL LOW (ref 2.3–4.6)

## 2012-12-24 LAB — MAGNESIUM: Magnesium: 1.8 mg/dL (ref 1.5–2.5)

## 2012-12-24 LAB — GLUCOSE, CAPILLARY
Glucose-Capillary: 122 mg/dL — ABNORMAL HIGH (ref 70–99)
Glucose-Capillary: 123 mg/dL — ABNORMAL HIGH (ref 70–99)

## 2012-12-24 LAB — LACTATE DEHYDROGENASE: LDH: 417 U/L — ABNORMAL HIGH (ref 94–250)

## 2012-12-24 LAB — TRIGLYCERIDES: Triglycerides: 119 mg/dL (ref <150)

## 2012-12-24 LAB — CALCIUM, IONIZED: Calcium, Ion: 1.22 mmol/L (ref 1.13–1.30)

## 2012-12-24 MED ORDER — POTASSIUM CHLORIDE 2 MEQ/ML IV SOLN
80.0000 meq | INTRAVENOUS | Status: DC
  Filled 2012-12-24 (×2): qty 40

## 2012-12-24 MED ORDER — SODIUM CHLORIDE 0.9 % IV SOLN
INTRAVENOUS | Status: DC
  Filled 2012-12-24: qty 40

## 2012-12-24 MED ORDER — DEXTROSE 5 % IV SOLN
750.0000 mg | INTRAVENOUS | Status: DC
  Filled 2012-12-24: qty 750

## 2012-12-24 MED ORDER — MAGNESIUM SULFATE 50 % IJ SOLN
2.0000 g | Freq: Once | INTRAMUSCULAR | Status: DC
  Filled 2012-12-24: qty 4

## 2012-12-24 MED ORDER — PHENYLEPHRINE HCL 10 MG/ML IJ SOLN
30.0000 ug/min | INTRAVENOUS | Status: DC
  Filled 2012-12-24: qty 2

## 2012-12-24 MED ORDER — POTASSIUM CHLORIDE 10 MEQ/50ML IV SOLN
10.0000 meq | INTRAVENOUS | Status: AC | PRN
  Administered 2012-12-24 (×3): 10 meq via INTRAVENOUS

## 2012-12-24 MED ORDER — SODIUM CHLORIDE 0.9 % IV SOLN
Freq: Once | INTRAVENOUS | Status: AC
  Administered 2012-12-24: 09:00:00 via INTRAVENOUS
  Filled 2012-12-24: qty 5

## 2012-12-24 MED ORDER — NITROGLYCERIN IN D5W 200-5 MCG/ML-% IV SOLN
2.0000 ug/min | INTRAVENOUS | Status: DC
  Filled 2012-12-24: qty 250

## 2012-12-24 MED ORDER — EPINEPHRINE HCL 1 MG/ML IJ SOLN
0.5000 ug/min | INTRAVENOUS | Status: DC
  Filled 2012-12-24: qty 4

## 2012-12-24 MED ORDER — POTASSIUM CHLORIDE 10 MEQ/50ML IV SOLN
10.0000 meq | INTRAVENOUS | Status: AC
  Administered 2012-12-24 (×2): 10 meq via INTRAVENOUS
  Filled 2012-12-24 (×2): qty 50

## 2012-12-24 MED ORDER — SODIUM CHLORIDE 0.9 % IJ SOLN
10.0000 mL | Freq: Two times a day (BID) | INTRAMUSCULAR | Status: DC
  Administered 2012-12-26 – 2013-01-15 (×24): 10 mL via INTRAVENOUS

## 2012-12-24 MED ORDER — MAGNESIUM SULFATE 40 MG/ML IJ SOLN
2.0000 g | Freq: Once | INTRAMUSCULAR | Status: AC
  Administered 2012-12-24: 2 g via INTRAVENOUS
  Filled 2012-12-24: qty 50

## 2012-12-24 MED ORDER — DEXMEDETOMIDINE HCL IN NACL 400 MCG/100ML IV SOLN
0.1000 ug/kg/h | INTRAVENOUS | Status: DC
  Filled 2012-12-24 (×3): qty 100

## 2012-12-24 MED ORDER — DEXTROSE 5 % IV SOLN
1.5000 g | INTRAVENOUS | Status: AC
  Administered 2012-12-25: 1.5 g via INTRAVENOUS
  Filled 2012-12-24: qty 1.5

## 2012-12-24 MED ORDER — INSULIN REGULAR HUMAN 100 UNIT/ML IJ SOLN
INTRAMUSCULAR | Status: DC
  Filled 2012-12-24: qty 1

## 2012-12-24 MED ORDER — VANCOMYCIN HCL 10 G IV SOLR
1250.0000 mg | INTRAVENOUS | Status: DC
  Filled 2012-12-24: qty 1250

## 2012-12-24 MED ORDER — FAT EMULSION 20 % IV EMUL
240.0000 mL | INTRAVENOUS | Status: AC
  Administered 2012-12-24: 240 mL via INTRAVENOUS
  Filled 2012-12-24: qty 250

## 2012-12-24 MED ORDER — SODIUM CHLORIDE 0.9 % IJ SOLN
10.0000 mL | INTRAMUSCULAR | Status: DC | PRN
  Administered 2012-12-25 – 2013-01-17 (×19): 10 mL via INTRAVENOUS

## 2012-12-24 MED ORDER — DEXMEDETOMIDINE HCL IN NACL 200 MCG/50ML IV SOLN
0.3000 ug/kg/h | INTRAVENOUS | Status: DC
  Administered 2012-12-24: 0.3 ug/kg/h via INTRAVENOUS
  Administered 2012-12-24 – 2012-12-26 (×4): 0.6 ug/kg/h via INTRAVENOUS
  Administered 2012-12-26 (×2): 0.7 ug/kg/h via INTRAVENOUS
  Administered 2012-12-26 – 2012-12-30 (×20): 0.6 ug/kg/h via INTRAVENOUS
  Administered 2012-12-30: 0.4 ug/kg/h via INTRAVENOUS
  Administered 2012-12-31 (×2): 0.6 ug/kg/h via INTRAVENOUS
  Filled 2012-12-24 (×6): qty 50
  Filled 2012-12-24: qty 100
  Filled 2012-12-24 (×20): qty 50

## 2012-12-24 MED ORDER — PLASMA-LYTE 148 IV SOLN
INTRAVENOUS | Status: DC
  Filled 2012-12-24: qty 2.5

## 2012-12-24 MED ORDER — SODIUM CHLORIDE 0.9 % IV SOLN
INTRAVENOUS | Status: DC
  Filled 2012-12-24: qty 30

## 2012-12-24 MED ORDER — MAGNESIUM SULFATE 50 % IJ SOLN
40.0000 meq | INTRAMUSCULAR | Status: DC
  Filled 2012-12-24: qty 10

## 2012-12-24 MED ORDER — POTASSIUM CHLORIDE 10 MEQ/50ML IV SOLN
INTRAVENOUS | Status: AC
  Filled 2012-12-24: qty 150

## 2012-12-24 MED ORDER — TRACE MINERALS CR-CU-F-FE-I-MN-MO-SE-ZN IV SOLN
INTRAVENOUS | Status: AC
  Administered 2012-12-24: 18:00:00 via INTRAVENOUS
  Filled 2012-12-24: qty 2000

## 2012-12-24 MED ORDER — DOPAMINE-DEXTROSE 3.2-5 MG/ML-% IV SOLN: 2.0000 ug/kg/min | INTRAVENOUS | Status: DC

## 2012-12-24 NOTE — Progress Notes (Signed)
Pt monitor revealing multi-focal PVC/PAC's along with competition with DDD pacer, I-stat EC4 ran showing K+= 3.7 and creat= 0.77 w/ urine o/p > 30cc/hr; TCTS KCL protocol initiated with 3 runs of 10 mEq KCL/50cc IV x 3.

## 2012-12-24 NOTE — Progress Notes (Addendum)
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 EF 15-25% with mild to moderate RV dysfunction  Underwent HM II LVAD implant along with TV repair on 8/18. Once she returned to the ICU she had difficulties with RV and CVP rose to 20-25. She was taken back to the OR for RVAD support.   Remains intubated on BiVAD support. Off epi and dobutamine. MAPs 70-80s. CVPs 9-11  Almost 5L out yesterday but 4600 in. Now on TPN. CXR improved. Getting transfused for hgb 7.5. Co-ox 73%. On abx for presumed PNA WBC 20K->17k  RVAD 2600 RPM and 4.1 L  LVAD INTERROGATION:  HeartMate II LVAD:  Flow 4.3  liters/min, speed 8600, power 4.0, PI 4.7.  Multiple PI events   Objective:    Vital Signs:   Temp:  [98.8 F (37.1 C)-101.3 F (38.5 C)] 99.5 F (37.5 C) (08/24 0800) Pulse Rate:  [41-94] 92 (08/24 0916) Resp:  [0-22] 15 (08/24 0916) BP: (104-113)/(72-82) 113/82 mmHg (08/23 1525) SpO2:  [99 %-100 %] 100 % (08/24 0916) FiO2 (%):  [40 %] 40 % (08/24 0916) Weight:  [85 kg (187 lb 6.3 oz)-88 kg (194 lb 0.1 oz)] 85 kg (187 lb 6.3 oz) (08/24 0900) Last BM Date: 12/15/12 Mean arterial Pressure 60-70s  Intake/Output:   Intake/Output Summary (Last 24 hours) at 12/24/12 1101 Last data filed at 12/24/12 1000  Gross per 24 hour  Intake 4376.74 ml  Output   4275 ml  Net 101.74 ml     Physical Exam: General: Intubated and sedated, following commands HEENT: normal Neck: supple. CVP 18 . Carotids 2+ bilat; no bruits. No lymphadenopathy or thryomegaly appreciated. Swan in place R IJ Cor: Mechanical heart sounds with LVAD hum present.; CT in place RVAD tubes in place Lungs: CTA with bilateral crackles Abdomen: soft, nontender, mildly distended. Hypoactive BS Driveline: C/D/I; securement device intact  Extremities: no cyanosis, clubbing, rash, 1-2+ edema Neuro: alert & orientedx3, cranial nerves grossly  intact. moves all 4 extremities w/o difficulty. Affect pleasant  Telemetry: AV paced 80s  Labs: Basic Metabolic Panel:  Recent Labs Lab 12/20/12 0400 12/21/12 0420  12/22/12 0432 12/22/12 1500 12/23/12 0402 12/23/12 1445 12/24/12 0133 12/24/12 0500  NA 135 132*  < > 133* 133* 133* 133* 133* 130*  K 4.1 3.5  < > 3.4* 3.6 3.7 4.2 3.7 4.1  CL 102 98  --  97 97 98 98  --  94*  CO2 23 25  --  28 28 29 28   --  27  GLUCOSE 114* 115*  --  115* 158* 126* 122* 244* 301*  BUN 15 16  --  18 19 22 19   --  13  CREATININE 1.04 0.97  --  0.93 0.90 0.76 0.77  --  <0.20*  CALCIUM 8.4 8.5  --  8.3* 8.1* 8.2* 8.7  --  8.1*  MG 2.2 1.9  --  1.9  --  2.3  --   --  1.8  PHOS 3.5 2.6  --  2.5  --  2.1*  --   --  1.4*  < > = values in this interval not displayed.  Liver Function Tests:  Recent Labs Lab 12/20/12 0400 12/21/12 0420 12/22/12 0432 12/23/12 0402 12/24/12 0500  AST 92* 58* 39* 31 28  ALT 23 20 18 16 15   ALKPHOS 49 110 85 94 80  BILITOT 4.8* 6.0* 4.5* 1.8* 1.2  PROT 4.6* 4.9* 4.9* 4.8* 4.3*  ALBUMIN 2.8* 2.7* 2.4* 2.3* 1.9*   No results found for this basename: LIPASE, AMYLASE,  in the last 168 hours No results found for this basename: AMMONIA,  in the last 168 hours  CBC:  Recent Labs Lab 12/19/12 0311  12/20/12 0400  12/21/12 0420  12/22/12 0432 12/22/12 1500 12/23/12 0402 12/23/12 1600 12/23/12 2115 12/24/12 0133 12/24/12 0500  WBC 6.9  < > 11.1*  < > 13.4*  --  14.1* 15.9* 14.4* 20.3*  --   --  17.2*  NEUTROABS 5.4  --  9.0*  --  11.5*  --   --   --   --   --   --   --  12.9*  HGB 6.5*  < > 8.8*  < > 9.3*  < > 8.5* 9.6* 8.4* 7.9* 7.9* 8.8* 7.5*  HCT 18.3*  < > 25.0*  < > 26.7*  < > 24.8* 27.8* 24.5* 22.4* 23.3* 26.0* 22.5*  MCV 85.5  < > 82.2  < > 84.5  --  85.2 86.1 86.3 86.5  --   --  87.5  PLT 103*  < > 104*  < > 94*  --  112* 114* 117* 168  --   --  158  < > = values in this interval not displayed.  INR:  Recent Labs Lab 12/19/12 0812 12/19/12 1545  12/20/12 0400 12/21/12 0420 12/22/12 0432  INR 1.42 1.30 1.37 1.10 1.13    Imaging: Dg Chest Port 1 View  12/24/2012   *RADIOLOGY REPORT*  Clinical Data: Status post ventricular assist device placement.  PORTABLE CHEST - 1 VIEW  Comparison: 12/23/2012  Findings: The left ventricular assist device, bilateral chest tubes, the endotracheal tube, right internal jugular Swan-Ganz catheter and right internal jugular central venous line and nasogastric tube are all stable and well positioned.  Atelectasis adjacent to the left and right chest tubes as well as at the medial lung bases, appears slightly improved.  The lungs are otherwise clear.  No gross pneumothorax on this semi-erect study.  There is no mediastinal widening.  IMPRESSION: Mild improvement in the lung atelectasis.  No pulmonary edema or pneumothorax.  No evidence of an operative complication.  Support apparatus is stable well-positioned.   Original Report Authenticated By: Amie Portland, M.D.   Dg Chest Port 1 View  12/23/2012   *RADIOLOGY REPORT*  Clinical Data: Status post ventricular assist device placement  PORTABLE CHEST - 1 VIEW  Comparison: 12/22/2012  Findings: The position of the ventricular assist device is stable.  The endotracheal tube tip lies 1.5 cm above the carina.  A nasogastric tube passes below the diaphragm into the stomach.  The right internal jugular Swan-Ganz catheter has its tip in the right pulmonary artery.  An adjacent right internal jugular central venous catheter has its tip in the lower superior vena cava.  The left anterior chest wall AICD remains stable well-positioned.  Bilateral chest tubes are stable.  There is central vascular congestion and medial lung base atelectasis and perihilar atelectasis.  A small left pleural effusion is noted.  There is some mild airspace opacity along the left perihilar region that may reflect edema or atelectasis.  These findings are stable.  No gross pneumothorax on this semi-erect  study.  IMPRESSION: Ventricular assist device and support apparatus are stable and well positioned.  Small left effusion.  Mild left perihilar edema/atelectasis. Persistent medial lung base atelectasis.  No change from previous day's study.   Original Report Authenticated By: Amie Portland, M.D.     Medications:     Scheduled Medications: . antiseptic oral rinse  15 mL Mouth Rinse QID  . aspirin  324 mg Per Tube Daily  . aspirin  300 mg Rectal Daily  . bisacodyl  10 mg Oral Daily   Or  . bisacodyl  10 mg Rectal Daily  . cefTAZidime (FORTAZ)  IV  1 g Intravenous Q8H  . chlorhexidine  15 mL Mouth Rinse BID  . docusate  200 mg Per Tube Daily  . furosemide  40 mg Intravenous Q12H  . insulin aspart  0-24 Units Subcutaneous Q4H  . levalbuterol  1.25 mg Nebulization Q6H  . magic mouthwash  5 mL Oral QID  . magnesium sulfate  2 g Intravenous Once  . mupirocin ointment  1 application Topical BID  . pantoprazole (PROTONIX) IV  40 mg Intravenous Q12H  . vancomycin  750 mg Intravenous Q8H    Infusions: . sodium chloride 20 mL/hr at 12/24/12 0800  . sodium chloride Stopped (12/23/12 0700)  . sodium chloride 250 mL (12/23/12 1700)  . amiodarone (NEXTERONE PREMIX) 360 mg/200 mL dextrose 30 mg/hr (12/24/12 0800)  . DOBUTamine Stopped (12/23/12 1100)  . DOPamine 3 mcg/kg/min (12/24/12 0800)  . Marland KitchenTPN (CLINIMIX-E) Adult 40 mL/hr at 12/23/12 1738   And  . fat emulsion 240 mL (12/23/12 1738)  . Marland KitchenTPN (CLINIMIX-E) Adult     And  . fat emulsion    . fentaNYL infusion INTRAVENOUS 275 mcg/hr (12/24/12 0800)  . heparin 800 Units/hr (12/24/12 0800)  . lactated ringers 20 mL/hr at 12/23/12 1700  . midazolam (VERSED) infusion 8 mg/hr (12/24/12 0800)  . nitroGLYCERIN Stopped (12/18/12 1705)  . norepinephrine (LEVOPHED) Adult infusion 5 mcg/kg/min (12/24/12 0800)  . phenylephrine (NEO-SYNEPHRINE) Adult infusion Stopped (12/24/12 0000)    PRN Medications: acetaminophen (TYLENOL) oral liquid 160 mg/5  mL, diphenhydrAMINE, fentaNYL, heparin, midazolam, morphine injection, ondansetron (ZOFRAN) IV, ondansetron (ZOFRAN) IV, sodium chloride   Assessment:   1. Acute on chronic systolic HF with biventricular HF EF 10%  S/p HM II VAD with TV repair 12/18/12  2. RV failure  3. Cardiogenic shock  4. VT s/p Biotronik ICD on amio 5. Chronic renal failure  6. 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 ABL anemia  8. PNA 9. Severe protein calorie malnutrition - on TPN  Plan/Discussion:    Now off epi and dobutamine. Continue to wean neo and levophed. Will evaluate RV with echo today to see if we can wean RVAD slowly.  Volume status improving but she is getting lots of volume with TPN and other drips. Will see if we can concentrate.   Continue heparin.   The patient is critically ill with multiple organ systems failure and requires high complexity decision making for assessment and support, frequent evaluation and titration of therapies, application of advanced monitoring technologies and extensive interpretation of multiple databases.   Critical Care Time devoted to patient care services described in this note is 50 Minutes.  Morgan Rennert,MD 11:01 AM   Length of Stay: 16 VAD Team Pager [phone] (7am - 7am) VAD Issue  Non-VAD issues HF Pager [phone]

## 2012-12-24 NOTE — Progress Notes (Signed)
. HeartMate 2 Rounding Note  Subjective:   Postop day #6 HeartMate 2 implantation with subsequent emergency centri- mag RVAD because of the RV dysfunction. Patient with stable hemodynamics on BiVAD support. Responsive but comfortable with IV sedation. Mean flows approximately 4 L on each device.  PI events on LVAD last night when she moves on vent.  Good urine output now on IV Lasix 40 twice a day . Heparin started with PTT goal 70-100 and  ACT goal 180. Heparin currently at 800 units per hour and measuring PTT every 8hours  Plan is to rest the RV with low CVP and remove fluid with gentle diuresis. Continue antibiotics- chest is open except for skin closure patient with a low-grade temperature and leukocytosis, sputum and urine and blood cultures pending- all negative so far.   LVAD INTERROGATION:  HeartMate II LVAD:  Flow 4.1 liters/min, speed 8600 power 4.2, PI 4.8.   Objective:    Vital Signs:   Temp:  [98.8 F (37.1 C)-101.3 F (38.5 C)] 99.5 F (37.5 C) (08/24 0800) Pulse Rate:  [41-92] 92 (08/24 0916) Resp:  [0-19] 15 (08/24 0916) BP: (104-113)/(72-82) 113/82 mmHg (08/23 1525) SpO2:  [99 %-100 %] 100 % (08/24 0916) FiO2 (%):  [40 %] 40 % (08/24 0916) Weight:  [187 lb 6.3 oz (85 kg)-194 lb 0.1 oz (88 kg)] 187 lb 6.3 oz (85 kg) (08/24 0900) Last BM Date: 12/15/12 Mean arterial Pressure 84  Intake/Output:   Intake/Output Summary (Last 24 hours) at 12/24/12 1114 Last data filed at 12/24/12 1000  Gross per 24 hour  Intake 4376.74 ml  Output   4275 ml  Net 101.74 ml     Physical Exam: General: Intubated in ICU on right ventricular assist device, sedated and comfortable HEENT: normal Neck: supple. JVP normal  No lymphadenopathy or thryomegaly appreciated. Cor: Mechanical heart sounds with LVAD hum present. Lungs: clear Abdomen: soft, nontender, nondistended. No hepatosplenomegaly. No bruits or masses. Good bowel sounds. Extremities: no cyanosis, clubbing, rash, edema  extremities warm Neuro: moves all 4 extremities but now is on sedation protocol while on ventilator  Telemetry: AV paced 90 per minute  Labs: Basic Metabolic Panel:  Recent Labs Lab 12/20/12 0400 12/21/12 0420  12/22/12 0432 12/22/12 1500 12/23/12 0402 12/23/12 1445 12/24/12 0133 12/24/12 0500  NA 135 132*  < > 133* 133* 133* 133* 133* 130*  K 4.1 3.5  < > 3.4* 3.6 3.7 4.2 3.7 4.1  CL 102 98  --  97 97 98 98  --  94*  CO2 23 25  --  28 28 29 28   --  27  GLUCOSE 114* 115*  --  115* 158* 126* 122* 244* 301*  BUN 15 16  --  18 19 22 19   --  13  CREATININE 1.04 0.97  --  0.93 0.90 0.76 0.77  --  <0.20*  CALCIUM 8.4 8.5  --  8.3* 8.1* 8.2* 8.7  --  8.1*  MG 2.2 1.9  --  1.9  --  2.3  --   --  1.8  PHOS 3.5 2.6  --  2.5  --  2.1*  --   --  1.4*  < > = values in this interval not displayed.  Liver Function Tests:  Recent Labs Lab 12/20/12 0400 12/21/12 0420 12/22/12 0432 12/23/12 0402 12/24/12 0500  AST 92* 58* 39* 31 28  ALT 23 20 18 16 15   ALKPHOS 49 110 85 94 80  BILITOT 4.8* 6.0* 4.5* 1.8*  1.2  PROT 4.6* 4.9* 4.9* 4.8* 4.3*  ALBUMIN 2.8* 2.7* 2.4* 2.3* 1.9*   No results found for this basename: LIPASE, AMYLASE,  in the last 168 hours No results found for this basename: AMMONIA,  in the last 168 hours  CBC:  Recent Labs Lab 12/19/12 0311  12/20/12 0400  12/21/12 0420  12/22/12 0432 12/22/12 1500 12/23/12 0402 12/23/12 1600 12/23/12 2115 12/24/12 0133 12/24/12 0500  WBC 6.9  < > 11.1*  < > 13.4*  --  14.1* 15.9* 14.4* 20.3*  --   --  17.2*  NEUTROABS 5.4  --  9.0*  --  11.5*  --   --   --   --   --   --   --  12.9*  HGB 6.5*  < > 8.8*  < > 9.3*  < > 8.5* 9.6* 8.4* 7.9* 7.9* 8.8* 7.5*  HCT 18.3*  < > 25.0*  < > 26.7*  < > 24.8* 27.8* 24.5* 22.4* 23.3* 26.0* 22.5*  MCV 85.5  < > 82.2  < > 84.5  --  85.2 86.1 86.3 86.5  --   --  87.5  PLT 103*  < > 104*  < > 94*  --  112* 114* 117* 168  --   --  158  < > = values in this interval not  displayed.  INR:  Recent Labs Lab 12/19/12 0812 12/19/12 1545 12/20/12 0400 12/21/12 0420 12/22/12 0432  INR 1.42 1.30 1.37 1.10 1.13    Other results:  EKG:   Imaging: Dg Chest Port 1 View  12/24/2012   *RADIOLOGY REPORT*  Clinical Data: Status post ventricular assist device placement.  PORTABLE CHEST - 1 VIEW  Comparison: 12/23/2012  Findings: The left ventricular assist device, bilateral chest tubes, the endotracheal tube, right internal jugular Swan-Ganz catheter and right internal jugular central venous line and nasogastric tube are all stable and well positioned.  Atelectasis adjacent to the left and right chest tubes as well as at the medial lung bases, appears slightly improved.  The lungs are otherwise clear.  No gross pneumothorax on this semi-erect study.  There is no mediastinal widening.  IMPRESSION: Mild improvement in the lung atelectasis.  No pulmonary edema or pneumothorax.  No evidence of an operative complication.  Support apparatus is stable well-positioned.   Original Report Authenticated By: Amie Portland, M.D.   Dg Chest Port 1 View  12/23/2012   *RADIOLOGY REPORT*  Clinical Data: Status post ventricular assist device placement  PORTABLE CHEST - 1 VIEW  Comparison: 12/22/2012  Findings: The position of the ventricular assist device is stable.  The endotracheal tube tip lies 1.5 cm above the carina.  A nasogastric tube passes below the diaphragm into the stomach.  The right internal jugular Swan-Ganz catheter has its tip in the right pulmonary artery.  An adjacent right internal jugular central venous catheter has its tip in the lower superior vena cava.  The left anterior chest wall AICD remains stable well-positioned.  Bilateral chest tubes are stable.  There is central vascular congestion and medial lung base atelectasis and perihilar atelectasis.  A small left pleural effusion is noted.  There is some mild airspace opacity along the left perihilar region that may  reflect edema or atelectasis.  These findings are stable.  No gross pneumothorax on this semi-erect study.  IMPRESSION: Ventricular assist device and support apparatus are stable and well positioned.  Small left effusion.  Mild left perihilar edema/atelectasis. Persistent medial lung base  atelectasis.  No change from previous day's study.   Original Report Authenticated By: Amie Portland, M.D.     Medications:     Scheduled Medications: . antiseptic oral rinse  15 mL Mouth Rinse QID  . aspirin  324 mg Per Tube Daily  . aspirin  300 mg Rectal Daily  . bisacodyl  10 mg Oral Daily   Or  . bisacodyl  10 mg Rectal Daily  . cefTAZidime (FORTAZ)  IV  1 g Intravenous Q8H  . chlorhexidine  15 mL Mouth Rinse BID  . docusate  200 mg Per Tube Daily  . furosemide  40 mg Intravenous Q12H  . insulin aspart  0-24 Units Subcutaneous Q4H  . levalbuterol  1.25 mg Nebulization Q6H  . magic mouthwash  5 mL Oral QID  . magnesium sulfate  2 g Intravenous Once  . mupirocin ointment  1 application Topical BID  . pantoprazole (PROTONIX) IV  40 mg Intravenous Q12H  . vancomycin  750 mg Intravenous Q8H    Infusions: . sodium chloride 20 mL/hr at 12/24/12 0800  . sodium chloride Stopped (12/23/12 0700)  . sodium chloride 250 mL (12/23/12 1700)  . amiodarone (NEXTERONE PREMIX) 360 mg/200 mL dextrose 30 mg/hr (12/24/12 0800)  . DOBUTamine Stopped (12/23/12 1100)  . DOPamine 3 mcg/kg/min (12/24/12 0800)  . Marland KitchenTPN (CLINIMIX-E) Adult 40 mL/hr at 12/23/12 1738   And  . fat emulsion 240 mL (12/23/12 1738)  . Marland KitchenTPN (CLINIMIX-E) Adult     And  . fat emulsion    . fentaNYL infusion INTRAVENOUS 275 mcg/hr (12/24/12 0800)  . heparin 800 Units/hr (12/24/12 0800)  . lactated ringers 20 mL/hr at 12/23/12 1700  . midazolam (VERSED) infusion 8 mg/hr (12/24/12 0800)  . nitroGLYCERIN Stopped (12/18/12 1705)  . norepinephrine (LEVOPHED) Adult infusion 5 mcg/kg/min (12/24/12 0800)  . phenylephrine (NEO-SYNEPHRINE) Adult  infusion Stopped (12/24/12 0000)    PRN Medications: acetaminophen (TYLENOL) oral liquid 160 mg/5 mL, diphenhydrAMINE, fentaNYL, heparin, midazolam, morphine injection, ondansetron (ZOFRAN) IV, ondansetron (ZOFRAN) IV, sodium chloride   Assessment:  1 status post implantable LVAD with subsequent severe RV dysfunction requiring right ventricular assist device 2 coagulopathy and bleeding requiring transfusion now improved 3 persistent RV dysfunction by 2-D echo during brief RVAD wean to 2 L per minute - will attempt to  4 therapeutic anticoagulation achieved with IV heparin drip for BIVAD Plan/Discussion:   Patient had episode of emesis from tube feeds but fortunately no evidence of aspiration. We'll place stomach to back to suction. Postpyloric tube not possible.   Patient needs nutrition with prealbumin less than 10 and tube feeds have been stopped secondary to ileus and emesis on ventilator. High risk of aspiration so we'll start TPN   Patient reviewed with Dr. Gala Romney at the bedside. And plan on temporary RVAD weanin preparation of possible RVAD removal Monday   I reviewed the LVAD parameters from today, and compared the results to the patient's prior recorded data.  No programming changes were made.  The LVAD is functioning within specified parameters.  The patient performs LVAD self-test daily.  LVAD interrogation was negative for any significant power changes, alarms or PI events/speed drops.  LVAD equipment check completed and is in good working order.  Back-up equipment present.   LVAD education done on emergency procedures and precautions and reviewed exit site care.  Length of Stay: 16  [name] III,PETER 12/24/2012, 11:14 AM

## 2012-12-24 NOTE — Progress Notes (Signed)
  Echocardiogram 2D Echocardiogram limited has been performed.  Kaitlin [name] [name] 12/24/2012, 12:29 PM

## 2012-12-24 NOTE — Progress Notes (Addendum)
CSW met with patient today. She will have surgery tomorrow for implantation of LVAD.  Patient related that she was "at peace" with having the surgery and was ready to proceed. She continues to exhibit a positive attitude and denied any current CSW needs at this time.  CSW  Will provide psychosocial support to patient and family during recovery period.  Lorri Frederick. [place]  [phone]   .

## 2012-12-24 NOTE — Progress Notes (Signed)
Pt. Reassessed for PICC placement. Not able to follow  Rt Basilic vein completely up arm. PICC would occupy 62% of this vein,  Rt. Basilic Vein close or on top of artery.  PICC would occupy over 60% of this vein.  PICC would occupy over 100% of Lt Basilic and Left Brachial veins.  Unable to locate either Rt. Or Lt Cephalic veins. PICC needs to occupy less than 45% of vein.   Veins too small or unsuitable for PICC placement.

## 2012-12-24 NOTE — Progress Notes (Signed)
ANTIBIOTIC CONSULT NOTE - FOLLOW UP  Pharmacy Consult for Vancomycin Indication: rule out pneumonia  No Known Allergies  Patient Measurements: Height: 5\' 9"  (175.3 cm) Weight: 187 lb 6.3 oz (85 kg) IBW/kg (Calculated) : 66.2  Vital Signs: Temp: 97.3 F (36.3 C) (08/24 2200) Temp src: Core (Comment) (08/24 2000) Pulse Rate: 83 (08/24 2200) Intake/Output from previous day: 08/23 0701 - 08/24 0700 In: 4633.9 [I.V.:2663.1; Blood:362.5; NG/GT:190; IV Piggyback:750; TPN:668.3] Out: 4875 [Urine:4095; Emesis/NG output:350; Drains:90; Chest Tube:340] Intake/Output from this shift: Total I/O In: 739.9 [I.V.:279.9; NG/GT:30; IV Piggyback:250; TPN:180] Out: 490 [Urine:280; Emesis/NG output:150; Chest Tube:60]  Labs:  Recent Labs  12/23/12 0402 12/23/12 1445 12/23/12 1600  12/24/12 0133 12/24/12 0500 12/24/12 1507  WBC 14.4*  --  20.3*  --   --  17.2*  --   HGB 8.4*  --  7.9*  < > 8.8* 7.5* 9.0*  PLT 117*  --  168  --   --  158  --   CREATININE 0.76 0.77  --   --   --  <0.20* 0.62  < > = values in this interval not displayed. Estimated Creatinine Clearance: 81.6 ml/min (by C-G formula based on Cr of 0.62).  Recent Labs  12/23/12 2115 12/24/12 2123  VANCOTROUGH 8.9* 18.0     Microbiology: Recent Results (from the past 720 hour(s))  MRSA PCR SCREENING     Status: None   Collection Time    12/08/12  3:37 PM      Result Value Range Status   MRSA by PCR NEGATIVE  NEGATIVE Final   Comment:            The GeneXpert MRSA Assay (FDA     approved for NASAL specimens     only), is one component of a     comprehensive MRSA colonization     surveillance program. It is not     intended to diagnose MRSA     infection nor to guide or     monitor treatment for     MRSA infections.  SURGICAL PCR SCREEN     Status: Abnormal   Collection Time    12/13/12  6:10 PM      Result Value Range Status   MRSA, PCR NEGATIVE  NEGATIVE Final   Staphylococcus aureus POSITIVE (*) NEGATIVE  Final   Comment:            The Xpert SA Assay (FDA     approved for NASAL specimens     in patients over 73 years of age),     is one component of     a comprehensive surveillance     program.  Test performance has     been validated by [facility] for patients greater     than or equal to 15 year old.     It is not intended     to diagnose infection nor to     guide or monitor treatment.  CULTURE, RESPIRATORY (NON-EXPECTORATED)     Status: None   Collection Time    12/19/12  9:50 AM      Result Value Range Status   Specimen Description TRACHEAL ASPIRATE   Final   Special Requests Normal   Final   Gram Stain     Final   Value: ABUNDANT WBC PRESENT, PREDOMINANTLY PMN     NO SQUAMOUS EPITHELIAL CELLS SEEN     RARE GRAM POSITIVE COCCI  IN PAIRS     Performed at [facility]   Culture     Final   Value: NO GROWTH 2 DAYS     Performed at [facility]   Report Status 12/21/2012 FINAL   Final  URINE CULTURE     Status: None   Collection Time    12/22/12  9:30 AM      Result Value Range Status   Specimen Description URINE, CATHETERIZED   Final   Special Requests NONE   Final   Culture  Setup Time     Final   Value: 12/22/2012 13:49     Performed at [facility] Count     Final   Value: NO GROWTH     Performed at [facility]   Culture     Final   Value: NO GROWTH     Performed at [facility]   Report Status 12/23/2012 FINAL   Final  CULTURE, RESPIRATORY (NON-EXPECTORATED)     Status: None   Collection Time    12/23/12  8:15 AM      Result Value Range Status   Specimen Description TRACHEAL ASPIRATE   Final   Special Requests NONE   Final   Gram Stain PENDING   Incomplete   Culture     Final   Value: NO GROWTH 1 DAY     Performed at [facility]   Report Status PENDING   Incomplete    Anti-infectives   Start     Dose/Rate Route Frequency Ordered Stop   12/25/12 0400  vancomycin (VANCOCIN) 1,250 mg in  sodium chloride 0.9 % 250 mL IVPB     1,250 mg 166.7 mL/hr over 90 Minutes Intravenous To Surgery 12/24/12 1926 12/26/12 0400   12/25/12 0400  cefUROXime (ZINACEF) 1.5 g in dextrose 5 % 50 mL IVPB     1.5 g 100 mL/hr over 30 Minutes Intravenous To Surgery 12/24/12 1926 12/26/12 0400   12/25/12 0400  cefUROXime (ZINACEF) 750 mg in dextrose 5 % 50 mL IVPB     750 mg 100 mL/hr over 30 Minutes Intravenous To Surgery 12/24/12 1828 12/26/12 0400   12/23/12 2230  vancomycin (VANCOCIN) IVPB 750 mg/150 ml premix     750 mg 150 mL/hr over 60 Minutes Intravenous 3 times per day 12/23/12 2223     12/22/12 1000  vancomycin (VANCOCIN) IVPB 750 mg/150 ml premix  Status:  Discontinued     750 mg 150 mL/hr over 60 Minutes Intravenous Every 12 hours 12/22/12 0854 12/23/12 2223   12/21/12 0600  cefTAZidime (FORTAZ) injection 1 g  Status:  Discontinued     1 g Intramuscular 3 times per day 12/20/12 1848 12/20/12 2002   12/20/12 2200  cefTAZidime (FORTAZ) 1 g in dextrose 5 % 50 mL IVPB     1 g 100 mL/hr over 30 Minutes Intravenous 3 times per day 12/20/12 1957     12/20/12 1800  vancomycin (VANCOCIN) IVPB 1000 mg/200 mL premix  Status:  Discontinued    Comments:  Continue until chest is closed   1,000 mg 200 mL/hr over 60 Minutes Intravenous Every 24 hours 12/20/12 0745 12/22/12 0854   12/20/12 1600  cefUROXime (ZINACEF) 1.5 g in dextrose 5 % 50 mL IVPB  Status:  Discontinued     1.5 g 100 mL/hr over 30 Minutes Intravenous Every 12 hours 12/20/12 0745 12/20/12 1848   12/19/12 1800  vancomycin (VANCOCIN) IVPB 1000 mg/200  mL premix  Status:  Discontinued    Comments:  Continue until chest is closed   1,000 mg 200 mL/hr over 60 Minutes Intravenous Every 24 hours 12/18/12 2344 12/20/12 0745   12/19/12 1615  cefUROXime (ZINACEF) 1.5 g in dextrose 5 % 50 mL IVPB  Status:  Discontinued     1.5 g 100 mL/hr over 30 Minutes Intravenous Every 12 hours 12/19/12 1601 12/20/12 0745   12/19/12 0800  rifampin (RIFADIN)  capsule 600 mg     600 mg Oral  Once 12/18/12 1544 12/19/12 1613   12/19/12 0600  fluconazole (DIFLUCAN) IVPB 400 mg     400 mg 100 mL/hr over 120 Minutes Intravenous  Once 12/18/12 1544 12/19/12 0816   12/19/12 0400  vancomycin (VANCOCIN) 1,250 mg in sodium chloride 0.9 % 250 mL IVPB  Status:  Discontinued     1,250 mg 166.7 mL/hr over 90 Minutes Intravenous To Surgery 12/18/12 2347 12/18/12 2356   12/19/12 0400  cefUROXime (ZINACEF) 1.5 g in dextrose 5 % 50 mL IVPB  Status:  Discontinued     1.5 g 100 mL/hr over 30 Minutes Intravenous To Surgery 12/18/12 2347 12/18/12 2356   12/19/12 0400  fluconazole (DIFLUCAN) IVPB 400 mg  Status:  Discontinued     400 mg 100 mL/hr over 120 Minutes Intravenous To Surgery 12/18/12 2347 12/18/12 2356   12/18/12 2300  cefUROXime (ZINACEF) 1.5 g in dextrose 5 % 50 mL IVPB  Status:  Discontinued     1.5 g 100 mL/hr over 30 Minutes Intravenous Every 12 hours 12/18/12 1544 12/18/12 1838   12/18/12 2200  vancomycin (VANCOCIN) IVPB 1000 mg/200 mL premix  Status:  Discontinued     1,000 mg 200 mL/hr over 60 Minutes Intravenous Every 24 hours 12/18/12 1616 12/18/12 1838   12/18/12 2145  vancomycin (VANCOCIN) IVPB 1000 mg/200 mL premix  Status:  Discontinued     1,000 mg 200 mL/hr over 60 Minutes Intravenous Every 12 hours 12/18/12 1544 12/18/12 1616   12/18/12 1845  cefUROXime (ZINACEF) 750 mg in dextrose 5 % 50 mL IVPB     750 mg 100 mL/hr over 30 Minutes Intravenous To Surgery 12/18/12 1828 12/19/12 1845   12/18/12 1845  vancomycin (VANCOCIN) powder 1,000 mg  Status:  Discontinued     1,000 mg Other To Surgery 12/18/12 1828 12/18/12 1838   12/18/12 1830  vancomycin (VANCOCIN) IVPB 1000 mg/200 mL premix     1,000 mg 200 mL/hr over 60 Minutes Intravenous  Once 12/18/12 1822 12/18/12 1930   12/18/12 1134  vancomycin (VANCOCIN) powder  Status:  Discontinued       As needed 12/18/12 1135 12/18/12 1411   12/18/12 0400  rifampin (RIFADIN) capsule 600 mg     600  mg Oral  Once 12/17/12 1924 12/18/12 0429   12/18/12 0400  vancomycin (VANCOCIN) 1,250 mg in sodium chloride 0.9 % 250 mL IVPB     1,250 mg 166.7 mL/hr over 90 Minutes Intravenous To Surgery 12/17/12 1048 12/18/12 0810   12/18/12 0400  cefUROXime (ZINACEF) 1.5 g in dextrose 5 % 50 mL IVPB     1.5 g 100 mL/hr over 30 Minutes Intravenous To Surgery 12/17/12 1048 12/18/12 1321   12/18/12 0400  cefUROXime (ZINACEF) 750 mg in dextrose 5 % 50 mL IVPB  Status:  Discontinued     750 mg 100 mL/hr over 30 Minutes Intravenous To Surgery 12/17/12 1047 12/18/12 1411   12/18/12 0400  fluconazole (DIFLUCAN) IVPB 400 mg  400 mg 100 mL/hr over 120 Minutes Intravenous To Surgery 12/17/12 1048 12/18/12 0825   12/18/12 0400  vancomycin (VANCOCIN) powder 1,000 mg  Status:  Discontinued     1,000 mg Other To Surgery 12/17/12 1047 12/18/12 1411      Assessment: 67 y.o. F on Vancomycin + Ceftazidime for r/o PNA with a therapeutic Vanc trough this evening however the afternoon dose was given a little later -- true trough likely lower but still within range. (Vanc trough~ 18 mcg/ml, goal of 15-20 mcg/ml). SCr 0.62, CrCl~70-80 ml/min. Will not change dose for now.   Goal of Therapy:  Vancomycin trough level 15-20 mcg/ml  Plan:  1. Continue Vancomycin 750 mg IV every 8 hours 2. Will continue to follow renal function, culture results, LOT, and antibiotic de-escalation plans   Georgina Pillion, PharmD, BCPS Clinical Pharmacist Pager: [phone] 12/24/2012 10:26 PM

## 2012-12-24 NOTE — Progress Notes (Signed)
CSW made visit to patient's room. She currently remains fully sedated and on ventilator.  Dr. Morton Peters in room and CSW spoke briefly with him regarding patient's condition. Will continue to check on patient and provide support as needed to patient and family.  Lorri Frederick. [place]  [phone]

## 2012-12-24 NOTE — Progress Notes (Signed)
CRITICAL VALUE ALERT  Critical value received:  APTT >200  Date of notification:  t  Time of notification:  0000  Critical value read back:yes  Nurse who received alert:  Almedia Balls, RN  MD notified (1st page):  Dr Donata Clay at bedside  Time of first page: 0000  MD notified (2nd page):  Time of second page:  Responding MD: Dr Donata Clay  Time MD responded: 0000  Re-draw lab test ordered and sent.

## 2012-12-24 NOTE — Progress Notes (Signed)
PARENTERAL NUTRITION CONSULT NOTE - Follow Up  Pharmacy Consult for TPN Indication: TF intolerance with emesis  No Known Allergies  Patient Measurements: Height: 5\' 9"  (175.3 cm) Weight: 194 lb 0.1 oz (88 kg) IBW/kg (Calculated) : 66.2 (22% above IBW)  Other Metrics Adjusted weight 72.1 kg  Nutritional weight 69.9 kg  Lean body weight 49.3 kg  BMI 26.3 kg/m2  BSA 1.98 m2   Vital Signs: Temp: 99.5 F (37.5 C) (08/24 0800) Pulse Rate: 84 (08/24 0800) Intake/Output from previous day: 08/23 0701 - 08/24 0700 In: 4633.9 [I.V.:2663.1; Blood:362.5; NG/GT:190; IV Piggyback:750; TPN:668.3] Out: 4875 [Urine:4095; Emesis/NG output:350; Drains:90; Chest Tube:340] Intake/Output from this shift: Total I/O In: 523.3 [I.V.:473.3; TPN:50] Out: 145 [Urine:125; Chest Tube:20]  Labs:  Recent Labs  12/22/12 0432  12/23/12 0402  12/23/12 1600 12/23/12 2115 12/23/12 2245 12/24/12 0010 12/24/12 0133 12/24/12 0500  WBC 14.1*  < > 14.4*  --  20.3*  --   --   --   --  17.2*  HGB 8.5*  < > 8.4*  --  7.9* 7.9*  --   --  8.8* 7.5*  HCT 24.8*  < > 24.5*  --  22.4* 23.3*  --   --  26.0* 22.5*  PLT 112*  < > 117*  --  168  --   --   --   --  158  APTT  --   < >  --   < >  --   --  >200* 149*  --  107*  INR 1.13  --   --   --   --   --   --   --   --   --   < > = values in this interval not displayed.   Recent Labs  12/22/12 0432  12/23/12 0402 12/23/12 1445 12/24/12 0133 12/24/12 0500  NA 133*  < > 133* 133* 133* 130*  K 3.4*  < > 3.7 4.2 3.7 4.1  CL 97  < > 98 98  --  94*  CO2 28  < > 29 28  --  27  GLUCOSE 115*  < > 126* 122* 244* 301*  BUN 18  < > 22 19  --  13  CREATININE 0.93  < > 0.76 0.77  --  <0.20*  CALCIUM 8.3*  < > 8.2* 8.7  --  8.1*  MG 1.9  --  2.3  --   --  1.8  PHOS 2.5  --  2.1*  --   --  1.4*  PROT 4.9*  --  4.8*  --   --  4.3*  ALBUMIN 2.4*  --  2.3*  --   --  1.9*  AST 39*  --  31  --   --  28  ALT 18  --  16  --   --  15  ALKPHOS 85  --  94  --   --  80   BILITOT 4.5*  --  1.8*  --   --  1.2  BILIDIR 2.9*  --   --   --   --   --   IBILI 1.6*  --   --   --   --   --   TRIG  --   --   --   --   --  119  < > = values in this interval not displayed. CrCl cannot be calculated (Patient has no sCr result on file.).  Recent Labs  12/23/12 2348 12/24/12 0421 12/24/12 0730  GLUCAP 138* 122* 110*    Medical History: Past Medical History  Diagnosis Date  . Paroxysmal supraventricular tachycardia   . Hypokalemia   . Chronic systolic heart failure     a. 01/6294 Echo: EF 15%, mod to sev antlat and inflat HK, inf AK, mild to mod MR, mildly/mod reduced RV fxn, Mod TR, PASP .  [name] History of DVT (deep vein thrombosis)   . Dyslipidemia   . HTN (hypertension)   . Nonischemic cardiomyopathy     a. 06/2012 Echo: EF 15%;  b. 06/2012 Cath: nl except luminal irregs in LAD.  [name] Ventricular tachycardia     a. s/p ICD  . Goiter   . Implantable cardiac defibrillator- Biotronik     Single chamber  . [name] (left bundle branch block)     intermittent  . Palliative care patient    Insulin Requirements in the past 24 hours:  She has no history of diabetes but received 6 units of Novolog in the last 24 hours for CBG's of 110- 138.  Current Nutrition:  She has been receiving Jevity 1.2 with 60ml of Prostat for a total of 1464kcal and 130 gm of protein.    Assessment: This is a 67 yo female with cardiac disease who recently underwent LVAD treatment.  She has severe heart failure and is undergoing gently duresis.  She had been receiving tube feeds for nutritional support but has been unable to tolerate with 200 cc of emesis.  TF's have been on hold and the plan is to use TPN short term to prevent nutritional deficit issues.   8/24 - She tolerated TPN overnight but shows some electrolyte abnormalities this morning with hyponatremia, and hypophosphatemia.  Nutritional Goals:  Her estimated needs are: (IBW 66kg) - 1650-1980 using normal needs of 25-30  kcal/kg/day (TBW 80.9kg) - 108-135 using 1.2-1.5g/kg/day 8/24 - We will not meet her protein requirements with the pre-mixed TPN solutions.  Would consider post-pyloric feeds if you feel PO intake will be prolonged for a while to ensure adequate nutritional support.  GI: She is slightly overweight with BMI of 26.3 but no noted GI issues. Lytes:  Low sodium and phosphorous this morning - Will give bolus of phosphorous, K+ runs going. Renal:  Renal function is good with creat. of 0.76 and an estimated crcl of 47ml/min Pulm:   Currently intubated with some sedation but responsive to questions Cards:  Post-op day #5 of LVAD with BiVad support.  She is currently volume overloaded with ongoing diuresis.  Her BP is tenuous but no longer on pressor support. Hepatobil:  T.Bili slightly elevated, low albumin and total protein. Neuro:   No neurological deficits noted ID:   Spiked temp. overnight, at 99.5 now (Vanc/Ceftazidime) - culture data non-revealing, still with some leukocytosis but down trend.   Best Practices:  Receiving IV heparin and IV PPI TPN Access: CVC Double Lumen Right Internal jugular - 12/18/12 >>  TPN day # 1  Plan:  1.  Will increase Clinimix 5/15 E to 50 ml/hr 2.  Start lipids at 52ml/hr 3.  Continue current SSI coverage 4.  F/U labs and adjust as needed.  Nadara Mustard, PharmD., MS Clinical Pharmacist Pager:  [phone] Thank you for allowing pharmacy to be part of this patients care team. 12/24/2012,8:45 AM

## 2012-12-24 NOTE — Clinical Social Work Psychosocial (Signed)
[place] CLINICAL SOCIAL WORK DOCUMENTATION LVAD (Left Ventricular Assist Device) Psychosocial Screening Please remember that all information is confidential within the members of the VAD team and [facility]  12/24/2012 3:53:17 PM  Patient:  Kaitlin [name]  MRN:  [id number]  Account:  [id number]  Clinical Social Worker:  Lupita Leash Roscoe Witts, LCSWA Date/Time Initiated:   12/13/2012 03:04 PM Referral Source:    Hessie Diener,  LVAD Coordinator  Dr. Jones Broom  Referral Reason:   LVAD Placement  Source of Information:   Patient, Daughters Antoinette and [place], ex- Husband John, and patient's medical chart   PATIENT DEMOGRAPHICS NAME:   Kaitlin [name]     DOB:  12-21-1945  SS#:  [id number] Address:   [address]  [place] [address] Home Phone:  [phone]  Cell Phone:   [phone] Marital Status:   Single     Primary Language:  ENGLISH  Faith:  OTHER Adherence with Medical Regimen:   Yes  "I am very strict with my medical care"  Medication Adherence:   Yes " I take my meds faithfully- always."  Physician appointment attendance:   Excellent.  "I don't miss my appointments."   Do you have a Living Will or Medical POA?  N Would you like to complete a Living Will and Medical POA prior to surgery?  Y Are you currently a DNR?  N Do you have a MOST form?  N Would you like to review one?  N Do you have goals of care?  N   Have you had a consult with the palliative care team at [facility]?  N Comment:  Patient and family will meet with Thomes Dinning, NP this afternoon at 1 pm to review goals of care, MOST form and DNR status. Patient does not wish to complete a DNR with CSW at this time but is agreeable to completion of a Health Care POA. This will be completed this week prior to surgery.  Psychological Health Appearance:   Patient was lying in bed in hospital gown.  Appears appropriately groomed and good hygeine was noted.   Mental status:   Alert, oriented all spheres. Engaged in conversation with both CSW and her family.  Eye Contact:   Excellent  Thought Content:   Coherent, rational and appropriate to conversation/medical status  Speech:   Normal rate, cadance, clear and consise  Mood:   Very positive mood. Patient was noted to be in good spirits and making some jokes during the interview.  Affect:   Appropriate for meed  Insight:   Very Good  Judgment:   Excellent  Interaction Style:   Very Good   Family/Social Information Who lives in your home? Name9  patient lives alone      Do you have a plan for child care if relevant?   NA  List family members outside the home (parents, friends, pastor, etc..) Name2  Antoinette Francoise Schaumann    Relationship to [place]  Daughter  Daughter    Please list people who give you emotional support (family, parents, friends, pastor, etc..) Name3  Tamika    Relationship to you3  Neice  Friend    Who is your primary and backup support pre and post-surgery? Explain the relationships i.e. strengths/weakness, etc.:   Daughters: Sherry Ruffing and Joni Reining  Strengths:  They are both very supportive and invovled. Both have access to transportation and are committed to caring for patient.  Weakness:  Both daughters work and  Joni Reining has children. They do not live in [place].  Also- mother is very very independent and verbalizes difficulty in asking for help.  Secondary Caregiver identified:   Daughter Joni Reining will be back up along with Neice Tamika.  Patient has a co-worker friend who can also be of assistance   Legal Do you currently have any legal issues/problems?   None  Durable POA or Legal Next of Kin:   Antoinette Moore- Daughter-  will be assisgned as Health Care POA later this week but there is no POA at this time.  [phone]   Living situation Travel distance to [facility]:  8-10 miles  Second Hand Smoke Exposure:    None. Patient does not smoke and no one would smoke in her home. Does not like to be around others who may smoke.  Self- Care level:   Patient was fully independent of all ADLS prior to hospitalization. She is fiercely independent and is struggling with this.  Ambulation:   Pateint is fully independent of her ambulation. No devices needed.  Transportation:   Patient has a own car- a 2008 Ford Focus.  The car has front and side airbags. Both daughters own vehicles a 200 TXS Accura and a 2006 Crysler. Both vehicles have standard airbags.  Limitations:   None noted at this time  Barriers impacting ability to participate in care:   None noted at this time   Community Are you active with community agencies/resources?   No  Are you active in a church, synagogue, mosque, or other faith based community?   No- "I have faith but I do not attend church."  What other sources do you have for spiritual support?   I listen to music, have my plants, walk  Are you active in any clubs or social organizations?   No- none  What do you do for fun? hobbies, interests?  TV provides most relaxation.  She enjoys listening to music- ( R& B, Jazz, [place]). She also walks and takes care of her plants.   Education/Work information What is the last grade of school you completed? Preferred method of learning? (Written, verbal, hands on):   she prefers to receive written information first, then oral and "hands on"  Do you have any problems with reading or writing?  None  Are you currently employed? If no, when were you last employed?   Yes  Name of employer:   [employer]  Please describe what kind of work you do/did?   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 No lifting or moving around.  How long have you worked there?   11 years  If you are not currently working, do you plan to return to work after VAD surgery?   Patient is very anxious to know when she would be able to return to work.  If yes, what  type of employment do you hope to find?   Wants to do some job  Are you interested in job training or learning new skills?   No  Did you serve in the Military? If so, what branch?   No   Financial Information What is your source of income?    Salary from work, Tree surgeon and a small pension  Do you have difficulty meeting your monthly expenses?   " No. "i get by. I am able to pay my bills but I'm not sure what will happen now that I cannot work.  It does worry me."  If yes, which ones?    NA  How do you usually cope with this?    NA  Primary Health Insurance:    [employer]/UHC PPO- [employer]  Secondary Insurance:    Medicare  Part A and B  Have you applied for Medicaid?    No  Have you applied for Social Security Disability?    Already receiving Social Security  Do you have prescription coverage?    Yes  What are your prescription co-pays?    Yes  Are you required to use a certain pharmacy?    No-  Uses [facility] and [facility]  Do you have a mail order option for your prescriptions?    Yes  If yes, what pharmacy do you use for mail order?    "I think it's called [facility]' or something- i can't remember right now."  Have you ever refused medication due to cost?    No  Discuss monthly cost for dressing supplies post procedure $150-300    Patient and daughters state that this has been discussed with them by Hessie Diener - VAD coordinator.  Patient has primary and secondary insurance and is hoping that co-pays will be covered.  Can you budget for this monthly expense?    "Not sure- I am worried about how I will pay my bills now that I'm not working."   Medical Information Briefly describe your medical history, surgeries and why you are here for evaluation.    Past medical history per chart:    Paroxysmal supraventricular tachycardia  .  Hypokalemia  .  Chronic systolic heart failure      a. 06/2012 Echo: EF 15%, mod to sev antlat and inflat HK, inf AK, mild to mod MR,  mildly/mod reduced RV fxn, Mod TR, PASP .  [name]  History of DVT (deep vein thrombosis)  .  Dyslipidemia  .  HTN (hypertension)  .  Nonischemic cardiomyopathy      a. 06/2012 Echo: EF 15%;  b. 06/2012 Cath: nl except luminal irregs in LAD.  [name]  Ventricular tachycardia      a. s/p ICD  .  Goiter  .  Implantable cardiac defibrillator- Biotronik      Single chamber  .  [name] (left bundle branch block)      intermittent  Patient relates that  her biggest health problems started about 4 years ago. She has high blood pressure and high cholesterol and doctors found she had "heart troubles." She stated that she felt she was managing fine until she started feeling poorly and had to come back to the doctor for follow up.  "They found that I had an irregular heartbeat".  Are you able to complete your ADL's?    yes  Do you have any history of emotional, medical, physical or verbal trauma?    No  Do you have any family history of heart problems?    Yes- Mother died of her "heart stopping"  Father had open heart surgery and valve replacement.  Do you smoke? If so, what is the amount and frequency?    No.  No one smokes in or outside the home  Do you drink alcohol? If so, how many drinks a day/week?    Yes- Social drinker only  Have you ever used illegal drugs or misused medications?    No  If yes, what drugs do you use and how often?    NA  Have you ever been treated for substance abuse?  No  If yes, where and when were you treated?    NA  Are you currently using illegal substances?    No   Mental Health History Have you ever had problems with depression, anxiety or other mental health issues?   Patient reports she experienced some situational depression when her mother passed away but did not seek any mental health treatments or medications.  She states that she occasionally feels "down" due to her health issues but she feels she copes by talking to her family, especially her daughters and  her 24 year old granddaughter.  If yes, have you seen a counselor, psychiatrist or therapist?    No  If you are currently experiencing problems are you interested in talking with a professional?    No  Would you be interested in participating in an LVAD support group?  Y LVAD support group for: Patient Caregivers caregivers Have you or are you taking any medications for anxiety/depression or any mental health concerns?    N  If yes, Please list the medications?    NA  How have you been feeling in the past year?    "I've been feeling pretty good- especially when I'm working.   I want to return to work as soon as I can. It has been hard to see my health go down the way it has."  How do you handle stressful situations?   "I try not to let stress get to me but I am aware when I feel stressed.  I have faith and that helps me alot with stress."  What are your coping strategies? Please List:    "i watch TV and enjoy many of the 'reality' shows.  I also enjoy all kinds of music- especially R & B and Jazz.  I play the game "Candy Crush' often too"  (Laughs)  Have you had any past or current thoughts of suicide?    No  How many hours do you sleep at night?    "It depends- sometimes 6 or more but I am a 'night owl' and enjoy being awake more at night. I've always been like that."  How is your appetite?   "Not very big but I do eat- usually 3 small meals a day. My appetite is good but I don't eat large meals.  PHQ2- Depression Screen:    Score:  0  PHQ9 Depression screen (only complete if the PHQ2 is positive):    NA   Plan for VAD Implementation Do you know and understand what happens during VAD surgery?  Please describe your thoughts:   "I know what the doctors have told me so far- how the VAD works and where it goes.  I know it hooks to my heart and that I will have a tube outside my body- in my stomach. I'm still learning alot- it's interesting and I don't feel scared- in fact I am ready to do  this if I have to."  Molly (LVAD Coordinator) tells me I"m going to be talking to someone who has been through the surgery today or tomorrow. "  What do you know about the risks of any major surgery or use of general anesthesia?    "The doctors talked to me about the risks of anesthesia and told me that I would be sedated and on a breathing machine for a while, then they would wake me up."  What do you know about the risks and side effects associated with VAD surgery?    "  Well they told me that there are definately risks with this surgery including infections, or my heart could fail or I could have a stroke. They said that any surgery is risky but dealing with the heart can be worse."   Patient's responses to both risks of anesthesia and surgery were appropriate.  Explain what will happen right after surgery?    "After surgery- they will take me to ICU- then I'll be on a breathing machine and probably won't know anybody for a little bit. They said I'd be groggy and sleeping alot." Later I will be moved to another unit."  Information obtained from:    MD, LVAD  Coordinator, Nursing  What is your plan for transportation for the first 8 weeks post-surgery? (Patients are not recommended to drive post-surgery for 8 weeks)    Daughters or other family members can drive her to appointments.  Driver: Daughters Engineer, petroleum and [place] both have cars.  Patient still drives but understands no driving for next 8 weeks post surgery  Valid license:    Yes  Working vehicle/last inspection:    Yes- patient and both daughters have working vehicles with current inspections. Patient could not remember exactly when her date of inspection was but stated that it is currentl  Airbags:  Yes- all cars in the family have airbags Do you plan to disarm the airbags- there is a risk of discharging the device if the airbag were to deploy.   There is no current plan to disarm the airbags but family is willing to do so if  recommended.  Discussed issues regarding airbags and what could happen if they were to go off.  Patient states that she normally rides in the back seat when others are driving and verbalized understanding of seriousness of airbag issues.  What do you know about your diet post-surgery?    "It should be a heart diet- I already try to eat good now but sometimes I eat bad things."  (Laughs)  I know I'll have to watch my diet more after surgery."  How do you plan to monitor your medications, current and future?    "I use a pill box and I am able to keep my medications straight. If i needed help I would ask my daughters to help me."  How do you plan to complete ADL's post-surgery Forensic scientist, dress, etc.)?    "I am very independent but they told me I'd need help after surgery. I want to get strong as fast as possible so I can do things for myself. Kirt Boys told me I could take a shower but no baths; and that I would need help with dressing and things for a while."  Will it be difficult to ask for help for your caregivers? If so, explain:    "Yes- it will be difficult. I am a very determined person" (Her daughters spoke up in agreement and stated that she is very 'hard headed' and rarely asks for help from anyone.) Patient agreed with this but recognizes that she will have to ask for help.  Please explain what you hope will be improved about your life as a result of receiving the VAD    "I'll be able to walk and have more energy; I'll be able to go back to work. It's hard feeling tired all the time."  Please tell me your biggest concern or fear about receiving the VAD?    "I don't really have any concerns but I think I'll have more  questions answered after I talk to the lady who has had the surgery. I want to make sure I understand more about the batteries, how they are charged and all that. I also worry that I won't get well enough to go back to work."  Her daughters state that their  How do you cope with your  concerns/fears?    "I watch TV and listen to my music.  I will also call my daughters or spend time wth my 61 year old granddaughter. She makes me happy!"  Please explain your understanding of how your body will change? Are you worried about these changes?    "Well, I guess I will have to be hooked to the device from now on (until I get a new heart) and I will have to carry about the batteries.  I"m not worrked about the changes; I know my body will change because they will cut me open but I'm not worried about that either."  Do you see any barriers to your surgery or follow up? If yes, please explain:   " No- i just want to get this done and get to healing so I can go home."     Understanding of LVAD Surgical procedures and risks:    Reviewd and discussed  Electrical need for LVAD:    Reviewed and discussed. Family will check on 3 prong sites in the house and will plan to change over all sockets that need it.  Discussed back up plan to go to places that have power should she loose power at home.  Safety precautions with LVAD (Water, etc.):   Reviewed and discussed.  No baths; showers only. No swimming in pools, lakes, ponds etc  Potential side effects with LVAD:    Reviewd and discussed. She will follow up more with MD and VAD coordinator.  Types of Advanced heart failure therapies available:    Reviewed and Discussed. Per MD- she is bridge to transplant  LVAD daily self-care (dressing changes, computer check, extra supplies):    Reviewed and discussed. LVAD coordinator has spoken to patient and daughters about supplies and dressing changes as well.  Outpatient follow-up (follow-up in LVAD clinic; monitoring blood thinners)    Reviewed and discussed. She will have transportaiton to all appointments and is able to obtain her medications  Need for emergency planning:    Discussed and Reviewed. She will have an emergency plan in case power goes out- patient discussed chain of family members that  she would rely on if needed.  Will present to the emergency room if necessary.  Expectations for LVAD:    Patient appeared to have very realistic expectations of the LVAD and was able to verbalize a strong understanding of the device and procedures as well as how it will alter her life.  Current level of motivation to prepare for LVAD:    Patient appears to be extremely motivated and has a positive attitude  Patient's perception of need for LVAD:    Patient exhibits good perception of the need for LVAD and her expections appear to be realistic.  Present level of consent for LVAD:    Patient is fuly consenting to placement of LVAD  Reasons for seeking LVAD:    "My heart is working good anymore. I want to feel better."   Psychosocial Protective Factors  Although patient acknowledges that she is extremely independent- it is  clear that her daughters are very invovled and supportive; her ex husband Jonny Ruiz attended the  interview today and provided positive support. Patient shows excellent coping skills and seems to have a strong sense of family and accepts their support.  She denies any depression or psychiatric history. Patient uses humor appropriately and has a very positive outlook on her current and future health issues. Psychosocial Risk Factors  Patient worries about now being able to work- feels that this will cause her financial burden. Her daughters assure her that she will have anything that she needs. Adjustment to having to be attached to VAD device-  Clinical Intervention: CSW will provide clinical support to both patient and her family as needed during her recovery period in the hospital   Educated patient/family on the following Caregiver(s) role responsibilities:   Discussed and reviewed  Financial planning for LVAD:    Discussed and reviewed. Anticipate that patient will need financial assistance with cost of medical supplies- dressing changes etc.  Role of Clinical Social Worker:     Discussed and reviewed. CSW will provide support to patient and family during hospitalization.  Signs of depression and anxiety:    None noted at this time.  Support planning for LVAD:    Discussed and reviewed. Patient will receive continued support from CSW and LVAD coordinator.  LVAD process:    Discussed and reviewed.  LVAD coordinator, MD and Surgeon will continue to provide information and support as well.  Caregiver contract/agreement:   Discussed and reviewed.  Contact numbers of patient's daughters provided at beginning of interview.  Discussed Referral(s) to:    LVAD support group- patient stated she would be intersted in this should a group be developed.  Community Resources:    None at this time. CSW will continue to assess throughout her hospitalization and provide resources as indicated.  Clinical Impression Recommendations:    Mrs. Colins is an excelling psychosocial candidate for LVAD implantation. She has a very positive attitude and strong spirit; she has listened and noted the information that has been provided to her thus far regarding the implatation of the device as well as to express her desire to proceed with the surgery so that she can become strong and healthy again. She will receive excellent support from her 2 daughters as well as ex-husband and she agrees that she will seek assistance until such time that she can become independent again.  She exhibits a strong sense of self and independence and wants to be able to spend Reunion time with her family.  Her psychsocial risk is that she worries about some finances as she was working prior to surgery. She agrees that she will have to watch her resources but her daughters state that they will provide for anything she needs as well.  CSW recommends that patient allow her family to provide support as needed. CSW offered continued support and patient and daughters were very appreciative.    [place] CLINICAL SOCIAL WORK  DOCUMENTATION LVAD (Left Ventricular Assist Device) Psychosocial Screening Please remember that all information is confidential within the members of the VAD team and [facility]  12/24/2012 3:53:17 PM  Patient:  Kaitlin [name]  MRN:  [id number]  Account:  [id number]  Clinical Social Worker:  Lovette Cliche, LCSWA Date/Time Initiated:   12/13/2012 03:04 PM Referral Source:    Hessie Diener,  LVAD Coordinator  Dr. Jones Broom  Referral Reason:   LVAD Placement  Source of Information:   Patient, Daughters Antoinette and Gypsy, ex- Husband John, and patient's medical chart   PATIENT DEMOGRAPHICS NAME:  Kaitlin [name]     DOB:  [birth year]  SS#:  [id number] Address:   [address]  [place] [address] Home Phone:  [phone]  Cell Phone:   [phone] Marital Status:   Single     Primary Language:  ENGLISH  Faith:  OTHER Adherence with Medical Regimen:   Yes  "I am very strict with my medical care"  Medication Adherence:   Yes " I take my meds faithfully- always."  Physician appointment attendance:   Excellent.  "I don't miss my appointments."   Do you have a Living Will or Medical POA?  N Would you like to complete a Living Will and Medical POA prior to surgery?  Y Are you currently a DNR?  N Do you have a MOST form?  N Would you like to review one?  N Do you have goals of care?  N   Have you had a consult with the palliative care team at [facility]?  N Comment:  Patient and family will meet with Thomes Dinning, NP this afternoon at 1 pm to review goals of care, MOST form and DNR status. Patient does not wish to complete a DNR with CSW at this time but is agreeable to completion of a Health Care POA. This will be completed this week prior to surgery.  Psychological Health Appearance:   Patient was lying in bed in hospital gown.  Appears appropriately groomed and good hygeine was noted.  Mental status:   Alert, oriented all  spheres. Engaged in conversation with both CSW and her family.  Eye Contact:   Excellent  Thought Content:   Coherent, rational and appropriate to conversation/medical status  Speech:   Normal rate, cadance, clear and consise  Mood:   Very positive mood. Patient was noted to be in good spirits and making some jokes during the interview.  Affect:   Appropriate for meed  Insight:   Very Good  Judgment:   Excellent  Interaction Style:   Very Good   Family/Social Information Who lives in your home? Name9  patient lives alone      Do you have a plan for child care if relevant?   NA  List family members outside the home (parents, friends, pastor, etc..) Name2  Antoinette Francoise Schaumann    Relationship to [place]  Daughter  Daughter    Please list people who give you emotional support (family, parents, friends, pastor, etc..) Name3  Tamika    Relationship to you3  Neice  Friend    Who is your primary and backup support pre and post-surgery? Explain the relationships i.e. strengths/weakness, etc.:   Daughters: Sherry Ruffing and Joni Reining  Strengths:  They are both very supportive and invovled. Both have access to transportation and are committed to caring for patient.  Weakness:  Both daughters work and Joni Reining has children. They do not live in [place].  Also- mother is very very independent and verbalizes difficulty in asking for help.  Secondary Caregiver identified:   Daughter Joni Reining will be back up along with Neice Tamika.  Patient has a co-worker friend who can also be of assistance   Legal Do you currently have any legal issues/problems?   None  Durable POA or Legal Next of Kin:   Antoinette Moore- Daughter-  will be assisgned as Health Care POA later this week but there is no POA at this time.  [phone]   Living situation Travel distance to [facility]:  8-10 miles  Second Hand Smoke Exposure:   None. Patient does not smoke and no one  would smoke in her home. Does not like to be around others who may smoke.  Self- Care level:   Patient was fully independent of all ADLS prior to hospitalization. She is fiercely independent and is struggling with this.  Ambulation:   Pateint is fully independent of her ambulation. No devices needed.  Transportation:   Patient has a own car- a 2008 Ford Focus.  The car has front and side airbags. Both daughters own vehicles a 200 TXS Accura and a 2006 Crysler. Both vehicles have standard airbags.  Limitations:   None noted at this time  Barriers impacting ability to participate in care:   None noted at this time   Community Are you active with community agencies/resources?   No  Are you active in a church, synagogue, mosque, or other faith based community?   No- "I have faith but I do not attend church."  What other sources do you have for spiritual support?   I listen to music, have my plants, walk  Are you active in any clubs or social organizations?   No- none  What do you do for fun? hobbies, interests?  TV provides most relaxation.  She enjoys listening to music- ( R& B, Jazz, [place]). She also walks and takes care of her plants.   Education/Work information What is the last grade of school you completed? Preferred method of learning? (Written, verbal, hands on):   she prefers to receive written information first, then oral and "hands on"  Do you have any problems with reading or writing?  None  Are you currently employed? If no, when were you last employed?   Yes  Name of employer:   [employer]  Please describe what kind of work you do/did?   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 No lifting or moving around.  How long have you worked there?   11 years  If you are not currently working, do you plan to return to work after VAD surgery?   Patient is very anxious to know when she would be able to return to work.  If yes, what type of employment do you hope to find?    Wants to do some job  Are you interested in job training or learning new skills?   No  Did you serve in the Military? If so, what branch?   No   Financial Information What is your source of income?    Salary from work, Tree surgeon and a small pension  Do you have difficulty meeting your monthly expenses?   " No. "i get by. I am able to pay my bills but I'm not sure what will happen now that I cannot work.  It does worry me."  If yes, which ones?    NA  How do you usually cope with this?    NA  Primary Health Insurance:    UMR/UHC PPO- [employer]  Secondary Insurance:    Medicare  Part A and B  Have you applied for Medicaid?    No  Have you applied for Social Security Disability?    Already receiving Social Security  Do you have prescription coverage?    Yes  What are your prescription co-pays?    Yes  Are you required to use a certain pharmacy?    No-  Uses [facility] and [facility]  Do you have a mail order option for your prescriptions?    Yes  If yes, what pharmacy do you use for mail order?    "I think it's called [facility]' or something- i can't remember right now."  Have you ever refused medication due to cost?    No  Discuss monthly cost for dressing supplies post procedure $150-300    Patient and daughters state that this has been discussed with them by Hessie Diener - VAD coordinator.  Patient has primary and secondary insurance and is hoping that co-pays will be covered.  Can you budget for this monthly expense?    "Not sure- I am worried about how I will pay my bills now that I'm not working."   Medical Information Briefly describe your medical history, surgeries and why you are here for evaluation.    Past medical history per chart:    Paroxysmal supraventricular tachycardia  .  Hypokalemia  .  Chronic systolic heart failure      a. 06/2012 Echo: EF 15%, mod to sev antlat and inflat HK, inf AK, mild to mod MR, mildly/mod reduced RV fxn, Mod TR, PASP  .  [name]  History of DVT (deep vein thrombosis)  .  Dyslipidemia  .  HTN (hypertension)  .  Nonischemic cardiomyopathy      a. 06/2012 Echo: EF 15%;  b. 06/2012 Cath: nl except luminal irregs in LAD.  [name]  Ventricular tachycardia      a. s/p ICD  .  Goiter  .  Implantable cardiac defibrillator- Biotronik      Single chamber  .  [name] (left bundle branch block)      intermittent  Patient relates that  her biggest health problems started about 4 years ago. She has high blood pressure and high cholesterol and doctors found she had "heart troubles." She stated that she felt she was managing fine until she started feeling poorly and had to come back to the doctor for follow up.  "They found that I had an irregular heartbeat".  Are you able to complete your ADL's?    yes  Do you have any history of emotional, medical, physical or verbal trauma?    No  Do you have any family history of heart problems?    Yes- Mother died of her "heart stopping"  Father had open heart surgery and valve replacement.  Do you smoke? If so, what is the amount and frequency?    No.  No one smokes in or outside the home  Do you drink alcohol? If so, how many drinks a day/week?    Yes- Social drinker only  Have you ever used illegal drugs or misused medications?    No  If yes, what drugs do you use and how often?    NA  Have you ever been treated for substance abuse?    No  If yes, where and when were you treated?    NA  Are you currently using illegal substances?    No   Mental Health History Have you ever had problems with depression, anxiety or other mental health issues?   Patient reports she experienced some situational depression when her mother passed away but did not seek any mental health treatments or medications.  She states that she occasionally feels "down" due to her health issues but she feels she copes by talking to her family, especially her daughters and her 66 year old granddaughter.  If yes,  have you seen  a counselor, psychiatrist or therapist?    No  If you are currently experiencing problems are you interested in talking with a professional?    No  Would you be interested in participating in an LVAD support group?  Y LVAD support group for: Patient Caregivers caregivers Have you or are you taking any medications for anxiety/depression or any mental health concerns?    N  If yes, Please list the medications?    NA  How have you been feeling in the past year?    "I've been feeling pretty good- especially when I'm working.   I want to return to work as soon as I can. It has been hard to see my health go down the way it has."  How do you handle stressful situations?   "I try not to let stress get to me but I am aware when I feel stressed.  I have faith and that helps me alot with stress."  What are your coping strategies? Please List:    "i watch TV and enjoy many of the 'reality' shows.  I also enjoy all kinds of music- especially R & B and Jazz.  I play the game "Candy Crush' often too"  (Laughs)  Have you had any past or current thoughts of suicide?    No  How many hours do you sleep at night?    "It depends- sometimes 6 or more but I am a 'night owl' and enjoy being awake more at night. I've always been like that."  How is your appetite?   "Not very big but I do eat- usually 3 small meals a day. My appetite is good but I don't eat large meals.  PHQ2- Depression Screen:    Score:  0  PHQ9 Depression screen (only complete if the PHQ2 is positive):    NA   Plan for VAD Implementation Do you know and understand what happens during VAD surgery?  Please describe your thoughts:   "I know what the doctors have told me so far- how the VAD works and where it goes.  I know it hooks to my heart and that I will have a tube outside my body- in my stomach. I'm still learning alot- it's interesting and I don't feel scared- in fact I am ready to do this if I have to."  Molly (LVAD  Coordinator) tells me I"m going to be talking to someone who has been through the surgery today or tomorrow. "  What do you know about the risks of any major surgery or use of general anesthesia?    "The doctors talked to me about the risks of anesthesia and told me that I would be sedated and on a breathing machine for a while, then they would wake me up."  What do you know about the risks and side effects associated with VAD surgery?    "Well they told me that there are definately risks with this surgery including infections, or my heart could fail or I could have a stroke. They said that any surgery is risky but dealing with the heart can be worse."   Patient's responses to both risks of anesthesia and surgery were appropriate.  Explain what will happen right after surgery?    "After surgery- they will take me to ICU- then I'll be on a breathing machine and probably won't know anybody for a little bit. They said I'd be groggy and sleeping alot." Later I will be moved to another unit."  Information obtained from:    MD, LVAD  Coordinator, Nursing  What is your plan for transportation for the first 8 weeks post-surgery? (Patients are not recommended to drive post-surgery for 8 weeks)    Daughters or other family members can drive her to appointments.  Driver: Daughters Engineer, petroleum and Verdi both have cars.  Patient still drives but understands no driving for next 8 weeks post surgery  Valid license:    Yes  Working vehicle/last inspection:    Yes- patient and both daughters have working vehicles with current inspections. Patient could not remember exactly when her date of inspection was but stated that it is currentl  Airbags:  Yes- all cars in the family have airbags Do you plan to disarm the airbags- there is a risk of discharging the device if the airbag were to deploy.   There is no current plan to disarm the airbags but family is willing to do so if recommended.  Discussed issues regarding  airbags and what could happen if they were to go off.  Patient states that she normally rides in the back seat when others are driving and verbalized understanding of seriousness of airbag issues.  What do you know about your diet post-surgery?    "It should be a heart diet- I already try to eat good now but sometimes I eat bad things."  (Laughs)  I know I'll have to watch my diet more after surgery."  How do you plan to monitor your medications, current and future?    "I use a pill box and I am able to keep my medications straight. If i needed help I would ask my daughters to help me."  How do you plan to complete ADL's post-surgery Forensic scientist, dress, etc.)?    "I am very independent but they told me I'd need help after surgery. I want to get strong as fast as possible so I can do things for myself. Kirt Boys told me I could take a shower but no baths; and that I would need help with dressing and things for a while."  Will it be difficult to ask for help for your caregivers? If so, explain:    "Yes- it will be difficult. I am a very determined person" (Her daughters spoke up in agreement and stated that she is very 'hard headed' and rarely asks for help from anyone.) Patient agreed with this but recognizes that she will have to ask for help.  Please explain what you hope will be improved about your life as a result of receiving the VAD    "I'll be able to walk and have more energy; I'll be able to go back to work. It's hard feeling tired all the time."  Please tell me your biggest concern or fear about receiving the VAD?    "I don't really have any concerns but I think I'll have more questions answered after I talk to the lady who has had the surgery. I want to make sure I understand more about the batteries, how they are charged and all that. I also worry that I won't get well enough to go back to work."  Her daughters state that their  How do you cope with your concerns/fears?    "I watch TV and listen to  my music.  I will also call my daughters or spend time wth my 83 year old granddaughter. She makes me happy!"  Please explain your understanding of how your body will change? Are you worried  about these changes?    "Well, I guess I will have to be hooked to the device from now on (until I get a new heart) and I will have to carry about the batteries.  I"m not worrked about the changes; I know my body will change because they will cut me open but I'm not worried about that either."  Do you see any barriers to your surgery or follow up? If yes, please explain:   " No- i just want to get this done and get to healing so I can go home."     Understanding of LVAD Surgical procedures and risks:    Reviewd and discussed  Electrical need for LVAD:    Reviewed and discussed. Family will check on 3 prong sites in the house and will plan to change over all sockets that need it.  Discussed back up plan to go to places that have power should she loose power at home.  Safety precautions with LVAD (Water, etc.):   Reviewed and discussed.  No baths; showers only. No swimming in pools, lakes, ponds etc  Potential side effects with LVAD:    Reviewd and discussed. She will follow up more with MD and VAD coordinator.  Types of Advanced heart failure therapies available:    Reviewed and Discussed. Per MD- she is bridge to transplant  LVAD daily self-care (dressing changes, computer check, extra supplies):    Reviewed and discussed. LVAD coordinator has spoken to patient and daughters about supplies and dressing changes as well.  Outpatient follow-up (follow-up in LVAD clinic; monitoring blood thinners)    Reviewed and discussed. She will have transportaiton to all appointments and is able to obtain her medications  Need for emergency planning:    Discussed and Reviewed. She will have an emergency plan in case power goes out- patient discussed chain of family members that she would rely on if needed.  Will present to  the emergency room if necessary.  Expectations for LVAD:    Patient appeared to have very realistic expectations of the LVAD and was able to verbalize a strong understanding of the device and procedures as well as how it will alter her life.  Current level of motivation to prepare for LVAD:    Patient appears to be extremely motivated and has a positive attitude  Patient's perception of need for LVAD:    Patient exhibits good perception of the need for LVAD and her expections appear to be realistic.  Present level of consent for LVAD:    Patient is fuly consenting to placement of LVAD  Reasons for seeking LVAD:    "My heart is working good anymore. I want to feel better."   Psychosocial Protective Factors  Although patient acknowledges that she is extremely independent- it is  clear that her daughters are very invovled and supportive; her ex husband Jonny Ruiz attended the interview today and provided positive support. Patient shows excellent coping skills and seems to have a strong sense of family and accepts their support.  She denies any depression or psychiatric history. Patient uses humor appropriately and has a very positive outlook on her current and future health issues. Psychosocial Risk Factors  Patient worries about now being able to work- feels that this will cause her financial burden. Her daughters assure her that she will have anything that she needs. Adjustment to having to be attached to VAD device-  Clinical Intervention: CSW will provide clinical support to both patient and her family as  needed during her recovery period in the hospital   Educated patient/family on the following Caregiver(s) role responsibilities:   Discussed and reviewed  Financial planning for LVAD:    Discussed and reviewed. Anticipate that patient will need financial assistance with cost of medical supplies- dressing changes etc.  Role of Clinical Social Worker:    Discussed and reviewed. CSW will provide  support to patient and family during hospitalization.  Signs of depression and anxiety:    None noted at this time.  Support planning for LVAD:    Discussed and reviewed. Patient will receive continued support from CSW and LVAD coordinator.  LVAD process:    Discussed and reviewed.  LVAD coordinator, MD and Surgeon will continue to provide information and support as well.  Caregiver contract/agreement:   Discussed and reviewed.  Contact numbers of patient's daughters provided at beginning of interview.  Discussed Referral(s) to:    LVAD support group- patient stated she would be intersted in this should a group be developed.  Community Resources:    None at this time. CSW will continue to assess throughout her hospitalization and provide resources as indicated.  Clinical Impression Recommendations:    Mrs. Colins is an excelling psychosocial candidate for LVAD implantation. She has a very positive attitude and strong spirit; she has listened and noted the information that has been provided to her thus far regarding the implatation of the device as well as to express her desire to proceed with the surgery so that she can become strong and healthy again. She will receive excellent support from her 2 daughters as well as ex-husband and she agrees that she will seek assistance until such time that she can become independent again.  She exhibits a strong sense of self and independence and wants to be able to spend Reunion time with her family.  Her psychsocial risk is that she worries about some finances as she was working prior to surgery. She agrees that she will have to watch her resources but her daughters state that they will provide for anything she needs as well.  CSW recommends that patient allow her family to provide support as needed. CSW offered continued support and patient and daughters were very appreciative.    [place] CLINICAL SOCIAL WORK DOCUMENTATION LVAD (Left Ventricular Assist  Device) Psychosocial Screening Please remember that all information is confidential within the members of the VAD team and [facility]  12/24/2012 3:53:17 PM  Patient:  Kaitlin [name]  MRN:  [id number]  Account:  [id number]  Clinical Social Worker:  Lupita Leash Lanisa Ishler, LCSWA Date/Time Initiated:   12/13/2012 03:04 PM Referral Source:    Hessie Diener,  LVAD Coordinator  Dr. Jones Broom  Referral Reason:   LVAD Placement  Source of Information:   Patient, Daughters Antoinette and [place], ex- Husband John, and patient's medical chart   PATIENT DEMOGRAPHICS NAME:   Kaitlin [name]     DOB:  12-22-1945  SS#:  [id number] Address:   [address]  [place] [address] Home Phone:  [phone]  Cell Phone:   [phone] Marital Status:   Single     Primary Language:  ENGLISH  Faith:  OTHER Adherence with Medical Regimen:   Yes  "I am very strict with my medical care"  Medication Adherence:   Yes " I take my meds faithfully- always."  Physician appointment attendance:   Excellent.  "I don't miss my appointments."   Do you have a Living Will or  Medical POA?  N Would you like to complete a Living Will and Medical POA prior to surgery?  Y Are you currently a DNR?  N Do you have a MOST form?  N Would you like to review one?  N Do you have goals of care?  N   Have you had a consult with the palliative care team at [facility]?  N Comment:  Patient and family will meet with Thomes Dinning, NP this afternoon at 1 pm to review goals of care, MOST form and DNR status. Patient does not wish to complete a DNR with CSW at this time but is agreeable to completion of a Health Care POA. This will be completed this week prior to surgery.  Psychological Health Appearance:   Patient was lying in bed in hospital gown.  Appears appropriately groomed and good hygeine was noted.  Mental status:   Alert, oriented all spheres. Engaged in conversation with both CSW  and her family.  Eye Contact:   Excellent  Thought Content:   Coherent, rational and appropriate to conversation/medical status  Speech:   Normal rate, cadance, clear and consise  Mood:   Very positive mood. Patient was noted to be in good spirits and making some jokes during the interview.  Affect:   Appropriate for meed  Insight:   Very Good  Judgment:   Excellent  Interaction Style:   Very Good   Family/Social Information Who lives in your home? Name9  patient lives alone      Do you have a plan for child care if relevant?   NA  List family members outside the home (parents, friends, pastor, etc..) Name2  Antoinette Francoise Schaumann    Relationship to [place]  Daughter  Daughter    Please list people who give you emotional support (family, parents, friends, pastor, etc..) Name3  Tamika    Relationship to you3  Neice  Friend    Who is your primary and backup support pre and post-surgery? Explain the relationships i.e. strengths/weakness, etc.:   Daughters: Sherry Ruffing and Joni Reining  Strengths:  They are both very supportive and invovled. Both have access to transportation and are committed to caring for patient.  Weakness:  Both daughters work and Joni Reining has children. They do not live in [place].  Also- mother is very very independent and verbalizes difficulty in asking for help.  Secondary Caregiver identified:   Daughter Joni Reining will be back up along with Neice Tamika.  Patient has a co-worker friend who can also be of assistance   Legal Do you currently have any legal issues/problems?   None  Durable POA or Legal Next of Kin:   Antoinette Moore- Daughter-  will be assisgned as Health Care POA later this week but there is no POA at this time.  [phone]   Living situation Travel distance to [facility]:  8-10 miles  Second Hand Smoke Exposure:   None. Patient does not smoke and no one would smoke in her home. Does not like to be  around others who may smoke.  Self- Care level:   Patient was fully independent of all ADLS prior to hospitalization. She is fiercely independent and is struggling with this.  Ambulation:   Pateint is fully independent of her ambulation. No devices needed.  Transportation:   Patient has a own car- a 2008 Ford Focus.  The car has front and side airbags. Both daughters own vehicles a 200 TXS Accura and  a 2006 Crysler. Both vehicles have standard airbags.  Limitations:   None noted at this time  Barriers impacting ability to participate in care:   None noted at this time   Community Are you active with community agencies/resources?   No  Are you active in a church, synagogue, mosque, or other faith based community?   No- "I have faith but I do not attend church."  What other sources do you have for spiritual support?   I listen to music, have my plants, walk  Are you active in any clubs or social organizations?   No- none  What do you do for fun? hobbies, interests?  TV provides most relaxation.  She enjoys listening to music- ( R& B, Jazz, [place]). She also walks and takes care of her plants.   Education/Work information What is the last grade of school you completed? Preferred method of learning? (Written, verbal, hands on):   she prefers to receive written information first, then oral and "hands on"  Do you have any problems with reading or writing?  None  Are you currently employed? If no, when were you last employed?   Yes  Name of employer:   [employer]  Please describe what kind of work you do/did?   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 No lifting or moving around.  How long have you worked there?   11 years  If you are not currently working, do you plan to return to work after VAD surgery?   Patient is very anxious to know when she would be able to return to work.  If yes, what type of employment do you hope to find?   Wants to do some job  Are you interested in  job training or learning new skills?   No  Did you serve in the Military? If so, what branch?   No   Financial Information What is your source of income?    Salary from work, Tree surgeon and a small pension  Do you have difficulty meeting your monthly expenses?   " No. "i get by. I am able to pay my bills but I'm not sure what will happen now that I cannot work.  It does worry me."  If yes, which ones?    NA  How do you usually cope with this?    NA  Primary Health Insurance:    [employer] PPO- [employer]  Secondary Insurance:    Medicare  Part A and B  Have you applied for Medicaid?    No  Have you applied for Social Security Disability?    Already receiving Social Security  Do you have prescription coverage?    Yes  What are your prescription co-pays?    Yes  Are you required to use a certain pharmacy?    No-  Uses [facility] and [facility]  Do you have a mail order option for your prescriptions?    Yes  If yes, what pharmacy do you use for mail order?    "I think it's called [facility]' or something- i can't remember right now."  Have you ever refused medication due to cost?    No  Discuss monthly cost for dressing supplies post procedure $150-300    Patient and daughters state that this has been discussed with them by Hessie Diener - VAD coordinator.  Patient has primary and secondary insurance and is hoping that co-pays will be covered.  Can you budget for this monthly  expense?    "Not sure- I am worried about how I will pay my bills now that I'm not working."   Medical Information Briefly describe your medical history, surgeries and why you are here for evaluation.    Past medical history per chart:    Paroxysmal supraventricular tachycardia  .  Hypokalemia  .  Chronic systolic heart failure      a. 06/2012 Echo: EF 15%, mod to sev antlat and inflat HK, inf AK, mild to mod MR, mildly/mod reduced RV fxn, Mod TR, PASP .  [name]  History of DVT (deep vein thrombosis)   .  Dyslipidemia  .  HTN (hypertension)  .  Nonischemic cardiomyopathy      a. 06/2012 Echo: EF 15%;  b. 06/2012 Cath: nl except luminal irregs in LAD.  [name]  Ventricular tachycardia      a. s/p ICD  .  Goiter  .  Implantable cardiac defibrillator- Biotronik      Single chamber  .  [name] (left bundle branch block)      intermittent  Patient relates that  her biggest health problems started about 4 years ago. She has high blood pressure and high cholesterol and doctors found she had "heart troubles." She stated that she felt she was managing fine until she started feeling poorly and had to come back to the doctor for follow up.  "They found that I had an irregular heartbeat".  Are you able to complete your ADL's?    yes  Do you have any history of emotional, medical, physical or verbal trauma?    No  Do you have any family history of heart problems?    Yes- Mother died of her "heart stopping"  Father had open heart surgery and valve replacement.  Do you smoke? If so, what is the amount and frequency?    No.  No one smokes in or outside the home  Do you drink alcohol? If so, how many drinks a day/week?    Yes- Social drinker only  Have you ever used illegal drugs or misused medications?    No  If yes, what drugs do you use and how often?    NA  Have you ever been treated for substance abuse?    No  If yes, where and when were you treated?    NA  Are you currently using illegal substances?    No   Mental Health History Have you ever had problems with depression, anxiety or other mental health issues?   Patient reports she experienced some situational depression when her mother passed away but did not seek any mental health treatments or medications.  She states that she occasionally feels "down" due to her health issues but she feels she copes by talking to her family, especially her daughters and her 36 year old granddaughter.  If yes, have you seen a counselor, psychiatrist or  therapist?    No  If you are currently experiencing problems are you interested in talking with a professional?    No  Would you be interested in participating in an LVAD support group?  Y LVAD support group for: Patient Caregivers caregivers Have you or are you taking any medications for anxiety/depression or any mental health concerns?    N  If yes, Please list the medications?    NA  How have you been feeling in the past year?    "I've been feeling pretty good- especially when I'm working.   I want  to return to work as soon as I can. It has been hard to see my health go down the way it has."  How do you handle stressful situations?   "I try not to let stress get to me but I am aware when I feel stressed.  I have faith and that helps me alot with stress."  What are your coping strategies? Please List:    "i watch TV and enjoy many of the 'reality' shows.  I also enjoy all kinds of music- especially R & B and Jazz.  I play the game "Candy Crush' often too"  (Laughs)  Have you had any past or current thoughts of suicide?    No  How many hours do you sleep at night?    "It depends- sometimes 6 or more but I am a 'night owl' and enjoy being awake more at night. I've always been like that."  How is your appetite?   "Not very big but I do eat- usually 3 small meals a day. My appetite is good but I don't eat large meals.  PHQ2- Depression Screen:    Score:  0  PHQ9 Depression screen (only complete if the PHQ2 is positive):    NA   Plan for VAD Implementation Do you know and understand what happens during VAD surgery?  Please describe your thoughts:   "I know what the doctors have told me so far- how the VAD works and where it goes.  I know it hooks to my heart and that I will have a tube outside my body- in my stomach. I'm still learning alot- it's interesting and I don't feel scared- in fact I am ready to do this if I have to."  Molly (LVAD Coordinator) tells me I"m going to be talking to  someone who has been through the surgery today or tomorrow. "  What do you know about the risks of any major surgery or use of general anesthesia?    "The doctors talked to me about the risks of anesthesia and told me that I would be sedated and on a breathing machine for a while, then they would wake me up."  What do you know about the risks and side effects associated with VAD surgery?    "Well they told me that there are definately risks with this surgery including infections, or my heart could fail or I could have a stroke. They said that any surgery is risky but dealing with the heart can be worse."   Patient's responses to both risks of anesthesia and surgery were appropriate.  Explain what will happen right after surgery?    "After surgery- they will take me to ICU- then I'll be on a breathing machine and probably won't know anybody for a little bit. They said I'd be groggy and sleeping alot." Later I will be moved to another unit."  Information obtained from:    MD, LVAD  Coordinator, Nursing  What is your plan for transportation for the first 8 weeks post-surgery? (Patients are not recommended to drive post-surgery for 8 weeks)    Daughters or other family members can drive her to appointments.  Driver: Daughters Engineer, petroleum and [place] both have cars.  Patient still drives but understands no driving for next 8 weeks post surgery  Valid license:    Yes  Working vehicle/last inspection:    Yes- patient and both daughters have working vehicles with current inspections. Patient could not remember exactly when her date of  inspection was but stated that it is currentl  Airbags:  Yes- all cars in the family have airbags Do you plan to disarm the airbags- there is a risk of discharging the device if the airbag were to deploy.   There is no current plan to disarm the airbags but family is willing to do so if recommended.  Discussed issues regarding airbags and what could happen if they were to go  off.  Patient states that she normally rides in the back seat when others are driving and verbalized understanding of seriousness of airbag issues.  What do you know about your diet post-surgery?    "It should be a heart diet- I already try to eat good now but sometimes I eat bad things."  (Laughs)  I know I'll have to watch my diet more after surgery."  How do you plan to monitor your medications, current and future?    "I use a pill box and I am able to keep my medications straight. If i needed help I would ask my daughters to help me."  How do you plan to complete ADL's post-surgery Forensic scientist, dress, etc.)?    "I am very independent but they told me I'd need help after surgery. I want to get strong as fast as possible so I can do things for myself. Kirt Boys told me I could take a shower but no baths; and that I would need help with dressing and things for a while."  Will it be difficult to ask for help for your caregivers? If so, explain:    "Yes- it will be difficult. I am a very determined person" (Her daughters spoke up in agreement and stated that she is very 'hard headed' and rarely asks for help from anyone.) Patient agreed with this but recognizes that she will have to ask for help.  Please explain what you hope will be improved about your life as a result of receiving the VAD    "I'll be able to walk and have more energy; I'll be able to go back to work. It's hard feeling tired all the time."  Please tell me your biggest concern or fear about receiving the VAD?    "I don't really have any concerns but I think I'll have more questions answered after I talk to the lady who has had the surgery. I want to make sure I understand more about the batteries, how they are charged and all that. I also worry that I won't get well enough to go back to work."  Her daughters state that their  How do you cope with your concerns/fears?    "I watch TV and listen to my music.  I will also call my daughters or  spend time wth my 40 year old granddaughter. She makes me happy!"  Please explain your understanding of how your body will change? Are you worried about these changes?    "Well, I guess I will have to be hooked to the device from now on (until I get a new heart) and I will have to carry about the batteries.  I"m not worrked about the changes; I know my body will change because they will cut me open but I'm not worried about that either."  Do you see any barriers to your surgery or follow up? If yes, please explain:   " No- i just want to get this done and get to healing so I can go home."     Understanding of  LVAD Surgical procedures and risks:    Reviewd and discussed  Electrical need for LVAD:    Reviewed and discussed. Family will check on 3 prong sites in the house and will plan to change over all sockets that need it.  Discussed back up plan to go to places that have power should she loose power at home.  Safety precautions with LVAD (Water, etc.):   Reviewed and discussed.  No baths; showers only. No swimming in pools, lakes, ponds etc  Potential side effects with LVAD:    Reviewd and discussed. She will follow up more with MD and VAD coordinator.  Types of Advanced heart failure therapies available:    Reviewed and Discussed. Per MD- she is bridge to transplant  LVAD daily self-care (dressing changes, computer check, extra supplies):    Reviewed and discussed. LVAD coordinator has spoken to patient and daughters about supplies and dressing changes as well.  Outpatient follow-up (follow-up in LVAD clinic; monitoring blood thinners)    Reviewed and discussed. She will have transportaiton to all appointments and is able to obtain her medications  Need for emergency planning:    Discussed and Reviewed. She will have an emergency plan in case power goes out- patient discussed chain of family members that she would rely on if needed.  Will present to the emergency room if necessary.   Expectations for LVAD:    Patient appeared to have very realistic expectations of the LVAD and was able to verbalize a strong understanding of the device and procedures as well as how it will alter her life.  Current level of motivation to prepare for LVAD:    Patient appears to be extremely motivated and has a positive attitude  Patient's perception of need for LVAD:    Patient exhibits good perception of the need for LVAD and her expections appear to be realistic.  Present level of consent for LVAD:    Patient is fuly consenting to placement of LVAD  Reasons for seeking LVAD:    "My heart is working good anymore. I want to feel better."   Psychosocial Protective Factors  Although patient acknowledges that she is extremely independent- it is  clear that her daughters are very invovled and supportive; her ex husband Jonny Ruiz attended the interview today and provided positive support. Patient shows excellent coping skills and seems to have a strong sense of family and accepts their support.  She denies any depression or psychiatric history. Patient uses humor appropriately and has a very positive outlook on her current and future health issues. Psychosocial Risk Factors  Patient worries about now being able to work- feels that this will cause her financial burden. Her daughters assure her that she will have anything that she needs. Adjustment to having to be attached to VAD device-  Clinical Intervention: CSW will provide clinical support to both patient and her family as needed during her recovery period in the hospital   Educated patient/family on the following Caregiver(s) role responsibilities:   Discussed and reviewed  Financial planning for LVAD:    Discussed and reviewed. Anticipate that patient will need financial assistance with cost of medical supplies- dressing changes etc.  Role of Clinical Social Worker:    Discussed and reviewed. CSW will provide support to patient and family  during hospitalization.  Signs of depression and anxiety:    None noted at this time.  Support planning for LVAD:    Discussed and reviewed. Patient will receive continued support from CSW  and LVAD coordinator.  LVAD process:    Discussed and reviewed.  LVAD coordinator, MD and Surgeon will continue to provide information and support as well.  Caregiver contract/agreement:   Discussed and reviewed.  Contact numbers of patient's daughters provided at beginning of interview.  Discussed Referral(s) to:    LVAD support group- patient stated she would be intersted in this should a group be developed.  Community Resources:    None at this time. CSW will continue to assess throughout her hospitalization and provide resources as indicated.  Clinical Impression Recommendations:    Mrs. Colins is an excellent psychosocial candidate for LVAD implantation. She has a very positive attitude and strong spirit; she has listened and noted the information that has been provided to her thus far regarding the implatation of the device as well as to express her desire to proceed with the surgery so that she can become strong and healthy again. She will receive excellent support from her 2 daughters as well as ex-husband and she agrees that she will seek assistance until such time that she can become independent again.  She exhibits a strong sense of self and independence and wants to be able to spend Reunion time with her family.  Her psychsocial risk is that she worries about some finances as she was working prior to surgery. She agrees that she will have to watch her resources but her daughters state that they will provide for anything she needs as well.  CSW recommends that patient allow her family to provide support as needed. CSW offered continued support and patient and daughters were very appreciative.

## 2012-12-25 ENCOUNTER — Inpatient Hospital Stay (HOSPITAL_COMMUNITY): Payer: Commercial Managed Care - PPO | Admitting: Certified Registered"

## 2012-12-25 ENCOUNTER — Inpatient Hospital Stay (HOSPITAL_COMMUNITY): Payer: Commercial Managed Care - PPO

## 2012-12-25 ENCOUNTER — Encounter (HOSPITAL_COMMUNITY)
Admission: RE | Disposition: A | Discharge status: Home Health | Payer: Self-pay | Source: Ambulatory Visit | Attending: Cardiothoracic Surgery

## 2012-12-25 ENCOUNTER — Encounter (HOSPITAL_COMMUNITY): Payer: Self-pay | Admitting: Certified Registered"

## 2012-12-25 DIAGNOSIS — I5023 Acute on chronic systolic (congestive) heart failure: Secondary | ICD-10-CM

## 2012-12-25 DIAGNOSIS — Z95818 Presence of other cardiac implants and grafts: Secondary | ICD-10-CM

## 2012-12-25 DIAGNOSIS — I428 Other cardiomyopathies: Secondary | ICD-10-CM

## 2012-12-25 HISTORY — PX: REMOVAL OF CENTRIMAG VENTRICULAR ASSIST DEVICE: SHX6220

## 2012-12-25 HISTORY — PX: INTRAOPERATIVE TRANSESOPHAGEAL ECHOCARDIOGRAM: SHX5062

## 2012-12-25 LAB — CBC
HCT: 21.3 % — ABNORMAL LOW (ref 36.0–46.0)
HCT: 21.4 % — ABNORMAL LOW (ref 36.0–46.0)
HCT: 26.6 % — ABNORMAL LOW (ref 36.0–46.0)
Hemoglobin: 7.3 g/dL — ABNORMAL LOW (ref 12.0–15.0)
Hemoglobin: 7.5 g/dL — ABNORMAL LOW (ref 12.0–15.0)
MCH: 29.9 pg (ref 26.0–34.0)
MCH: 29.8 pg (ref 26.0–34.0)
MCHC: 34.3 g/dL (ref 30.0–36.0)
MCHC: 35 g/dL (ref 30.0–36.0)
MCHC: 35.3 g/dL (ref 30.0–36.0)
MCV: 87.3 fL (ref 78.0–100.0)
MCV: 84.9 fL (ref 78.0–100.0)
MCV: 84.2 fL (ref 78.0–100.0)
Platelets: 178 10*3/uL (ref 150–400)
Platelets: 89 10*3/uL — ABNORMAL LOW (ref 150–400)
Platelets: 227 10*3/uL (ref 150–400)
RBC: 2.44 MIL/uL — ABNORMAL LOW (ref 3.87–5.11)
RBC: 2.52 MIL/uL — ABNORMAL LOW (ref 3.87–5.11)
RDW: 16.3 % — ABNORMAL HIGH (ref 11.5–15.5)
RDW: 15.5 % (ref 11.5–15.5)
RDW: 14.8 % (ref 11.5–15.5)
WBC: 19.3 10*3/uL — ABNORMAL HIGH (ref 4.0–10.5)
WBC: 30.3 10*3/uL — ABNORMAL HIGH (ref 4.0–10.5)

## 2012-12-25 LAB — TYPE AND SCREEN
ABO/RH(D): A POS
Antibody Screen: NEGATIVE
Unit division: 0
Unit division: 0
Unit division: 0
Unit division: 0
Unit division: 0

## 2012-12-25 LAB — CULTURE, RESPIRATORY W GRAM STAIN: Culture: NO GROWTH

## 2012-12-25 LAB — POCT ACTIVATED CLOTTING TIME: Activated Clotting Time: 124 seconds

## 2012-12-25 LAB — CARBOXYHEMOGLOBIN
Carboxyhemoglobin: 2.2 % — ABNORMAL HIGH (ref 0.5–1.5)
Carboxyhemoglobin: 2.6 % — ABNORMAL HIGH (ref 0.5–1.5)
Carboxyhemoglobin: 1.9 % — ABNORMAL HIGH (ref 0.5–1.5)
Methemoglobin: 1.7 % — ABNORMAL HIGH (ref 0.0–1.5)
Methemoglobin: 2.6 % — ABNORMAL HIGH (ref 0.0–1.5)
Methemoglobin: 2.8 % — ABNORMAL HIGH (ref 0.0–1.5)
O2 Saturation: 76.1 %
O2 Saturation: 65 %
O2 Saturation: 50.1 %
Total hemoglobin: 7.3 g/dL — ABNORMAL LOW (ref 12.0–16.0)
Total hemoglobin: 8.3 g/dL — ABNORMAL LOW (ref 12.0–16.0)
Total hemoglobin: 7.2 g/dL — ABNORMAL LOW (ref 12.0–16.0)

## 2012-12-25 LAB — POCT I-STAT 3, ART BLOOD GAS (G3+)
Acid-Base Excess: 2 mmol/L (ref 0.0–2.0)
Acid-Base Excess: 4 mmol/L — ABNORMAL HIGH (ref 0.0–2.0)
Acid-Base Excess: 5 mmol/L — ABNORMAL HIGH (ref 0.0–2.0)
Acid-base deficit: 2 mmol/L (ref 0.0–2.0)
Bicarbonate: 27.5 mEq/L — ABNORMAL HIGH (ref 20.0–24.0)
Bicarbonate: 28.2 mEq/L — ABNORMAL HIGH (ref 20.0–24.0)
Bicarbonate: 25.4 mEq/L — ABNORMAL HIGH (ref 20.0–24.0)
Bicarbonate: 27.2 mEq/L — ABNORMAL HIGH (ref 20.0–24.0)
Bicarbonate: 23.4 mEq/L (ref 20.0–24.0)
O2 Saturation: 100 %
O2 Saturation: 100 %
O2 Saturation: 38 %
TCO2: 29 mmol/L (ref 0–100)
TCO2: 26 mmol/L (ref 0–100)
TCO2: 28 mmol/L (ref 0–100)
pCO2 arterial: 36.2 mmHg (ref 35.0–45.0)
pH, Arterial: 7.4 (ref 7.350–7.450)
pH, Arterial: 7.455 — ABNORMAL HIGH (ref 7.350–7.450)
pH, Arterial: 7.54 — ABNORMAL HIGH (ref 7.350–7.450)
pO2, Arterial: 157 mmHg — ABNORMAL HIGH (ref 80.0–100.0)
pO2, Arterial: 290 mmHg — ABNORMAL HIGH (ref 80.0–100.0)
pO2, Arterial: 21 mmHg — CL (ref 80.0–100.0)
pO2, Arterial: 169 mmHg — ABNORMAL HIGH (ref 80.0–100.0)

## 2012-12-25 LAB — BASIC METABOLIC PANEL
BUN: 18 mg/dL (ref 6–23)
CO2: 23 mEq/L (ref 19–32)
Calcium: 6.9 mg/dL — ABNORMAL LOW (ref 8.4–10.5)
Chloride: 104 mEq/L (ref 96–112)
Creatinine, Ser: 0.7 mg/dL (ref 0.50–1.10)
GFR calc Af Amer: >90 mL/min (ref >90)
GFR calc non Af Amer: 89 mL/min — ABNORMAL LOW (ref >90)
Glucose, Bld: 160 mg/dL — ABNORMAL HIGH (ref 70–99)
Potassium: 3.3 mEq/L — ABNORMAL LOW (ref 3.5–5.1)
Sodium: 136 mEq/L (ref 135–145)

## 2012-12-25 LAB — COMPREHENSIVE METABOLIC PANEL
ALT: 18 U/L (ref 0–35)
AST: 34 U/L (ref 0–37)
Albumin: 1.9 g/dL — ABNORMAL LOW (ref 3.5–5.2)
Alkaline Phosphatase: 78 U/L (ref 39–117)
BUN: 17 mg/dL (ref 6–23)
CO2: 31 mEq/L (ref 19–32)
Calcium: 8.1 mg/dL — ABNORMAL LOW (ref 8.4–10.5)
Chloride: 97 mEq/L (ref 96–112)
Creatinine, Ser: 0.72 mg/dL (ref 0.50–1.10)
GFR calc Af Amer: >90 mL/min (ref >90)
GFR calc non Af Amer: 88 mL/min — ABNORMAL LOW (ref >90)
Glucose, Bld: 142 mg/dL — ABNORMAL HIGH (ref 70–99)
Potassium: 3.8 mEq/L (ref 3.5–5.1)
Sodium: 131 mEq/L — ABNORMAL LOW (ref 135–145)
Total Bilirubin: 1.1 mg/dL (ref 0.3–1.2)
Total Protein: 4.3 g/dL — ABNORMAL LOW (ref 6.0–8.3)

## 2012-12-25 LAB — POCT I-STAT 3, VENOUS BLOOD GAS (G3P V)
Acid-Base Excess: 3 mmol/L — ABNORMAL HIGH (ref 0.0–2.0)
Acid-Base Excess: 3 mmol/L — ABNORMAL HIGH (ref 0.0–2.0)
Bicarbonate: 26.9 mEq/L — ABNORMAL HIGH (ref 20.0–24.0)
O2 Saturation: 35 %
O2 Saturation: 43 %
TCO2: 28 mmol/L (ref 0–100)
TCO2: 27 mmol/L (ref 0–100)
pCO2, Ven: 36 mmHg — ABNORMAL LOW (ref 45.0–50.0)
pCO2, Ven: 34.9 mmHg — ABNORMAL LOW (ref 45.0–50.0)
pO2, Ven: 19 mmHg — CL (ref 30.0–45.0)

## 2012-12-25 LAB — APTT
aPTT: 100 seconds — ABNORMAL HIGH (ref 24–37)
aPTT: 43 seconds — ABNORMAL HIGH (ref 24–37)

## 2012-12-25 LAB — DIFFERENTIAL
Basophils Absolute: 0 10*3/uL (ref 0.0–0.1)
Basophils Relative: 0 % (ref 0–1)
Eosinophils Absolute: 0.4 10*3/uL (ref 0.0–0.7)
Eosinophils Relative: 2 % (ref 0–5)
Lymphocytes Relative: 10 % — ABNORMAL LOW (ref 12–46)
Lymphs Abs: 1.9 10*3/uL (ref 0.7–4.0)
Monocytes Absolute: 1.9 10*3/uL — ABNORMAL HIGH (ref 0.1–1.0)
Monocytes Relative: 10 % (ref 3–12)
Neutro Abs: 15.1 10*3/uL — ABNORMAL HIGH (ref 1.7–7.7)
Neutrophils Relative %: 78 % — ABNORMAL HIGH (ref 43–77)

## 2012-12-25 LAB — HEPARIN LEVEL (UNFRACTIONATED): Heparin Unfractionated: 0.2 IU/mL — ABNORMAL LOW (ref 0.30–0.70)

## 2012-12-25 LAB — PREPARE RBC (CROSSMATCH)

## 2012-12-25 LAB — LACTATE DEHYDROGENASE: LDH: 424 U/L — ABNORMAL HIGH (ref 94–250)

## 2012-12-25 LAB — PREPARE PLATELET PHERESIS: Unit division: 0

## 2012-12-25 LAB — POCT I-STAT 4, (NA,K, GLUC, HGB,HCT)
Glucose, Bld: 142 mg/dL — ABNORMAL HIGH (ref 70–99)
Potassium: 4.1 mEq/L (ref 3.5–5.1)
Sodium: 132 mEq/L — ABNORMAL LOW (ref 135–145)
Sodium: 133 mEq/L — ABNORMAL LOW (ref 135–145)

## 2012-12-25 LAB — PREALBUMIN: Prealbumin: 5.9 mg/dL — ABNORMAL LOW (ref 17.0–34.0)

## 2012-12-25 LAB — TRIGLYCERIDES: Triglycerides: 159 mg/dL — ABNORMAL HIGH (ref <150)

## 2012-12-25 LAB — CALCIUM, IONIZED: Calcium, Ion: 1.24 mmol/L (ref 1.13–1.30)

## 2012-12-25 LAB — GLUCOSE, CAPILLARY: Glucose-Capillary: 171 mg/dL — ABNORMAL HIGH (ref 70–99)

## 2012-12-25 LAB — PHOSPHORUS: Phosphorus: 1.9 mg/dL — ABNORMAL LOW (ref 2.3–4.6)

## 2012-12-25 LAB — MAGNESIUM: Magnesium: 2.1 mg/dL (ref 1.5–2.5)

## 2012-12-25 SURGERY — REMOVAL, VENTRICULAR ASSIST DEVICE, EXTRACORPOREAL
Anesthesia: General | Site: Chest | Wound class: Clean

## 2012-12-25 MED ORDER — CALCIUM CHLORIDE 10 % IV SOLN
1.0000 g | Freq: Once | INTRAVENOUS | Status: AC
  Administered 2012-12-25: 1 g via INTRAVENOUS

## 2012-12-25 MED ORDER — ALBUMIN HUMAN 5 % IV SOLN
INTRAVENOUS | Status: AC
  Administered 2012-12-25: 12.5 g
  Filled 2012-12-25: qty 250

## 2012-12-25 MED ORDER — ALBUMIN HUMAN 5 % IV SOLN
12.5000 g | Freq: Once | INTRAVENOUS | Status: AC
  Administered 2012-12-25: 12.5 g via INTRAVENOUS

## 2012-12-25 MED ORDER — VECURONIUM BROMIDE 10 MG IV SOLR
INTRAVENOUS | Status: DC | PRN
  Administered 2012-12-25 (×2): 10 mg via INTRAVENOUS

## 2012-12-25 MED ORDER — SODIUM CHLORIDE 0.9 % IV SOLN
Freq: Once | INTRAVENOUS | Status: DC
  Filled 2012-12-25: qty 5

## 2012-12-25 MED ORDER — VANCOMYCIN HCL 1000 MG IV SOLR
1000.0000 mg | INTRAVENOUS | Status: DC
  Filled 2012-12-25: qty 1000

## 2012-12-25 MED ORDER — ALBUMIN HUMAN 5 % IV SOLN: 12.5000 g | Freq: Once | INTRAVENOUS | Status: AC

## 2012-12-25 MED ORDER — ALBUMIN HUMAN 25 % IV SOLN: 12.5000 g | Freq: Once | INTRAVENOUS | Status: DC

## 2012-12-25 MED ORDER — ROCURONIUM BROMIDE 100 MG/10ML IV SOLN
INTRAVENOUS | Status: DC | PRN
  Administered 2012-12-25: 100 mg via INTRAVENOUS

## 2012-12-25 MED ORDER — PROTAMINE SULFATE 10 MG/ML IV SOLN
INTRAVENOUS | Status: DC | PRN
  Administered 2012-12-25: 30 mg via INTRAVENOUS
  Administered 2012-12-25: 20 mg via INTRAVENOUS
  Administered 2012-12-25: 50 mg via INTRAVENOUS

## 2012-12-25 MED ORDER — SODIUM CHLORIDE 0.9 % IV SOLN: 1.0000 g | Freq: Once | INTRAVENOUS | Status: DC

## 2012-12-25 MED ORDER — TRACE MINERALS CR-CU-F-FE-I-MN-MO-SE-ZN IV SOLN
INTRAVENOUS | Status: DC
  Filled 2012-12-25: qty 2000

## 2012-12-25 MED ORDER — 0.9 % SODIUM CHLORIDE (POUR BTL) OPTIME
TOPICAL | Status: DC | PRN
  Administered 2012-12-25: 1000 mL

## 2012-12-25 MED ORDER — VANCOMYCIN HCL 1000 MG IV SOLR
INTRAVENOUS | Status: DC
  Filled 2012-12-25: qty 1000

## 2012-12-25 MED ORDER — VANCOMYCIN HCL 1000 MG IV SOLR
INTRAVENOUS | Status: DC | PRN
  Administered 2012-12-25: 10:00:00

## 2012-12-25 MED ORDER — EPINEPHRINE HCL 1 MG/ML IJ SOLN
4000.0000 ug | INTRAVENOUS | Status: DC | PRN
  Administered 2012-12-25: 3 ug/min via INTRAVENOUS

## 2012-12-25 MED ORDER — FUROSEMIDE 10 MG/ML IJ SOLN: 40.0000 mg | INTRAMUSCULAR | Status: DC

## 2012-12-25 MED ORDER — FUROSEMIDE 10 MG/ML IJ SOLN
20.0000 mg | Freq: Once | INTRAMUSCULAR | Status: AC
  Administered 2012-12-25: 20 mg via INTRAVENOUS
  Filled 2012-12-25: qty 2

## 2012-12-25 MED ORDER — HEPARIN SODIUM (PORCINE) 1000 UNIT/ML IJ SOLN
INTRAMUSCULAR | Status: DC | PRN
  Administered 2012-12-25: 2000 [IU] via INTRAVENOUS
  Administered 2012-12-25: 10000 [IU] via INTRAVENOUS

## 2012-12-25 MED ORDER — VANCOMYCIN HCL 1000 MG IV SOLR
INTRAVENOUS | Status: DC | PRN
  Administered 2012-12-25: 1000 mg

## 2012-12-25 MED ORDER — POTASSIUM CHLORIDE 10 MEQ/50ML IV SOLN
10.0000 meq | INTRAVENOUS | Status: AC
  Administered 2012-12-25 (×4): 10 meq via INTRAVENOUS
  Filled 2012-12-25: qty 200

## 2012-12-25 MED ORDER — HEMOSTATIC AGENTS (NO CHARGE) OPTIME
TOPICAL | Status: DC | PRN
  Administered 2012-12-25: 1 via TOPICAL

## 2012-12-25 MED ORDER — FUROSEMIDE 10 MG/ML IJ SOLN: 40.0000 mg | Freq: Once | INTRAMUSCULAR | Status: DC

## 2012-12-25 MED ORDER — FAT EMULSION 20 % IV EMUL
240.0000 mL | INTRAVENOUS | Status: DC
  Filled 2012-12-25: qty 250

## 2012-12-25 MED ORDER — EPINEPHRINE HCL 1 MG/ML IJ SOLN
0.5000 ug/min | INTRAVENOUS | Status: DC
  Administered 2012-12-25 – 2012-12-27 (×4): 8 ug/min via INTRAVENOUS
  Administered 2012-12-27: 7 ug/min via INTRAVENOUS
  Administered 2012-12-28: 8 ug/min via INTRAVENOUS
  Filled 2012-12-25 (×9): qty 4

## 2012-12-25 MED ORDER — SODIUM CHLORIDE 0.9 % IV SOLN
INTRAVENOUS | Status: DC
  Administered 2012-12-25: 250 mL via INTRAVENOUS

## 2012-12-25 SURGICAL SUPPLY — 90 items
ATTRACTOMAT 16X20 MAGNETIC DRP (DRAPES) ×4 IMPLANT
BAG DECANTER FOR FLEXI CONT (MISCELLANEOUS) ×4 IMPLANT
BLADE SURG 10 STRL SS (BLADE) ×8 IMPLANT
BLADE SURG 12 STRL SS (BLADE) IMPLANT
CANISTER SUCTION 2500CC (MISCELLANEOUS) ×4 IMPLANT
CANNULA AORTIC HI-FLOW 6.5M20F (CANNULA) IMPLANT
CANNULA GUNDRY RCSP 15FR (MISCELLANEOUS) ×4 IMPLANT
CANNULA VENOUS MAL SGL STG 40 (MISCELLANEOUS) IMPLANT
CANNULAE VENOUS MAL SGL STG 40 (MISCELLANEOUS)
CATH CPB KIT VANTRIGT (MISCELLANEOUS) IMPLANT
CATH HYDRAGLIDE XL THORACIC (CATHETERS) ×4 IMPLANT
CATH ROBINSON RED A/P 18FR (CATHETERS) IMPLANT
CATH THORACIC 28FR (CATHETERS) IMPLANT
CATH THORACIC 28FR RT ANG (CATHETERS) IMPLANT
CATH THORACIC 36FR (CATHETERS) IMPLANT
CATH THORACIC 36FR RT ANG (CATHETERS) ×8 IMPLANT
CHLORAPREP W/TINT 10.5 ML (MISCELLANEOUS) ×8 IMPLANT
CLIP TI WIDE RED SMALL 24 (CLIP) IMPLANT
CLOTH BEACON ORANGE TIMEOUT ST (SAFETY) ×4 IMPLANT
CONN ST 1/4X3/8  BEN (MISCELLANEOUS) ×4
CONN ST 1/4X3/8 BEN (MISCELLANEOUS) ×4 IMPLANT
COVER SURGICAL LIGHT HANDLE (MISCELLANEOUS) ×4 IMPLANT
CRADLE DONUT ADULT HEAD (MISCELLANEOUS) ×4 IMPLANT
DRAIN CHANNEL 32F RND 10.7 FF (WOUND CARE) ×4 IMPLANT
DRAPE CARDIOVASCULAR INCISE (DRAPES) ×2
DRAPE SLUSH/WARMER DISC (DRAPES) ×4 IMPLANT
DRAPE SRG 135X102X78XABS (DRAPES) ×2 IMPLANT
DRSG COVADERM 4X14 (GAUZE/BANDAGES/DRESSINGS) ×4 IMPLANT
DRSG VAC ATS LRG SENSATRAC (GAUZE/BANDAGES/DRESSINGS) ×4 IMPLANT
DRSG VERSA FOAM LRG 10X15 (GAUZE/BANDAGES/DRESSINGS) ×8 IMPLANT
ELECT BLADE 4.0 EZ CLEAN MEGAD (MISCELLANEOUS) ×4
ELECT BLADE 6.5 EXT (BLADE) ×4 IMPLANT
ELECT CAUTERY BLADE 6.4 (BLADE) ×4 IMPLANT
ELECT REM PT RETURN 9FT ADLT (ELECTROSURGICAL) ×8
ELECTRODE BLDE 4.0 EZ CLN MEGD (MISCELLANEOUS) ×2 IMPLANT
ELECTRODE REM PT RTRN 9FT ADLT (ELECTROSURGICAL) ×4 IMPLANT
GLOVE BIO SURGEON STRL SZ7.5 (GLOVE) ×8 IMPLANT
GOWN STRL NON-REIN LRG LVL3 (GOWN DISPOSABLE) ×20 IMPLANT
HEMOSTAT POWDER SURGIFOAM 1G (HEMOSTASIS) IMPLANT
HEMOSTAT SURGICEL 2X14 (HEMOSTASIS) ×4 IMPLANT
INSERT FOGARTY XLG (MISCELLANEOUS) IMPLANT
KIT BASIN OR (CUSTOM PROCEDURE TRAY) ×4 IMPLANT
KIT ROOM TURNOVER OR (KITS) ×4 IMPLANT
KIT SUCTION CATH 14FR (SUCTIONS) ×8 IMPLANT
LEAD PACING MYOCARDI (MISCELLANEOUS) ×4 IMPLANT
NS IRRIG 1000ML POUR BTL (IV SOLUTION) ×20 IMPLANT
PACK OPEN HEART (CUSTOM PROCEDURE TRAY) ×4 IMPLANT
PAD ARMBOARD 7.5X6 YLW CONV (MISCELLANEOUS) ×8 IMPLANT
PENCIL BUTTON HOLSTER BLD 10FT (ELECTRODE) IMPLANT
SPOGE SURGIFLO 8M (HEMOSTASIS) ×2
SPONGE GAUZE 4X4 12PLY (GAUZE/BANDAGES/DRESSINGS) ×8 IMPLANT
SPONGE SURGIFLO 8M (HEMOSTASIS) ×2 IMPLANT
SUT BONE WAX W31G (SUTURE) ×4 IMPLANT
SUT ETHILON 3 0 FSL (SUTURE) ×12 IMPLANT
SUT MNCRL AB 4-0 PS2 18 (SUTURE) IMPLANT
SUT PROLENE 3 0 SH DA (SUTURE) IMPLANT
SUT PROLENE 3 0 SH1 36 (SUTURE) IMPLANT
SUT PROLENE 4 0 RB 1 (SUTURE) ×2
SUT PROLENE 4 0 SH DA (SUTURE) ×16 IMPLANT
SUT PROLENE 4-0 RB1 .5 CRCL 36 (SUTURE) ×2 IMPLANT
SUT PROLENE 5 0 C 1 36 (SUTURE) IMPLANT
SUT PROLENE 6 0 C 1 30 (SUTURE) IMPLANT
SUT PROLENE 7 0 DA (SUTURE) IMPLANT
SUT PROLENE 7.0 RB 3 (SUTURE) IMPLANT
SUT PROLENE 8 0 BV175 6 (SUTURE) IMPLANT
SUT PROLENE BLUE 7 0 (SUTURE) IMPLANT
SUT SILK  1 MH (SUTURE) ×2
SUT SILK 1 MH (SUTURE) ×2 IMPLANT
SUT SILK 2 0 SH CR/8 (SUTURE) IMPLANT
SUT SILK 3 0 SH CR/8 (SUTURE) IMPLANT
SUT STEEL 6MS V (SUTURE) IMPLANT
SUT STEEL STERNAL CCS#1 18IN (SUTURE) IMPLANT
SUT STEEL SZ 6 DBL 3X14 BALL (SUTURE) IMPLANT
SUT VIC AB 1 CTX 36 (SUTURE)
SUT VIC AB 1 CTX36XBRD ANBCTR (SUTURE) IMPLANT
SUT VIC AB 2-0 CT1 27 (SUTURE)
SUT VIC AB 2-0 CT1 TAPERPNT 27 (SUTURE) IMPLANT
SUT VIC AB 2-0 CTX 27 (SUTURE) IMPLANT
SUT VIC AB 3-0 SH 27 (SUTURE)
SUT VIC AB 3-0 SH 27X BRD (SUTURE) IMPLANT
SUT VIC AB 3-0 SH 8-18 (SUTURE) ×4 IMPLANT
SUT VIC AB 3-0 X1 27 (SUTURE) IMPLANT
SUT VICRYL 4-0 PS2 18IN ABS (SUTURE) IMPLANT
SUTURE E-PAK OPEN HEART (SUTURE) ×4 IMPLANT
SYSTEM SAHARA CHEST DRAIN ATS (WOUND CARE) ×8 IMPLANT
TOWEL OR 17X24 6PK STRL BLUE (TOWEL DISPOSABLE) ×8 IMPLANT
TOWEL OR 17X26 10 PK STRL BLUE (TOWEL DISPOSABLE) ×8 IMPLANT
TRAY FOLEY IC TEMP SENS 14FR (CATHETERS) IMPLANT
UNDERPAD 30X30 INCONTINENT (UNDERPADS AND DIAPERS) ×4 IMPLANT
WATER STERILE IRR 1000ML POUR (IV SOLUTION) ×8 IMPLANT

## 2012-12-25 NOTE — Progress Notes (Signed)
Dr Jones Broom called r/t decr. In [facility] P.I. Reading, Pt's clinical status reviewed pertaining to RVAD/LVAD readings, hemodynamics and urine output. Orders discussed to reposition Pt and infuse N.S. If P.I. Readings do not improve.

## 2012-12-25 NOTE — Progress Notes (Signed)
NUTRITION FOLLOW-UP  DOCUMENTATION CODES Per approved criteria  -Not Applicable   INTERVENTION: TPN management per PharmD. Note potential difficulty in meeting 100% estimated needs given limitations with pre-mixed TPN.    If pt's source of emesis/hypoactive bowel determined and resumption of enteral nutrition is appropriate, recommend post-pyloric tube placement to decrease risk of aspiration. Recommend initiation of elemental formula, Vital AF 1.2 at 10 mL/hr. RD to continue to follow nutrition care plan.  NUTRITION DIAGNOSIS: Inadequate oral intake now related to inability to eat as evidenced by NPO status. Ongoing.  Goal: Intake to meet >90% of estimated nutrition needs. Unmet.  Monitor:  Vent settings, labs, weight trend, post-op healing and recovery, tolerance of nutrition support  ASSESSMENT: Underwent the following 8/18: INSERTION OF IMPLANTABLE LEFT VENTRICULAR ASSIST DEVICE  INTRAOPERATIVE TRANSESOPHAGEAL ECHOCARDIOGRAM  TRICUSPID VALVE REPAIR  INSERTION OF IMPLANTABLE RIGHT VENTRICULAR ASSIST DEVICE   Pt found to have R ventricular dysfunction s/p LVAD implantation, required trip to OR later that evening for placement of RVAD. Pt now with LVAD and RVAD.   Remains intubated on ventilator support.  MV: 6.5 L/min Temp:Temp (24hrs), Avg:98.8 F (37.1 C), Min:97.2 F (36.2 C), Max:100.2 F (37.9 C) Temp elevated.   Propofol: none  Patient is receiving TPN with Clinimix E 5/15 @ 50 ml/hr and lipids @ 10 ml/hr. Provides 1440 ml, 1332 kcal, and 60 grams protein per day. Meets 82% minimum estimated energy needs and 54% minimum estimated protein needs.  Patient is currently in OR for mediastinal irrigation and possible RVAD wean.   Potassium and magnesium are WNL. Phosphorus is currently low at 1.9 - ordered for a Phos bolus per PharmD. Most recent prealbumin is low at 6.2. Triglycerides elevated at 159.  Height: Ht Readings from Last 1 Encounters:  12/22/12 5\' 9"   (1.753 m)    Weight: Wt Readings from Last 1 Encounters:  12/25/12 195 lb 5.2 oz (88.6 kg)  Admission wt: 165 lb Current wt is up 30 lb.  BMI:  Body mass index is 29.3 kg/(m^2) - using admission weight.  Estimated Nutritional Needs: Kcal: 1628 Protein: 112 - 131 gm Fluid: 1.5 - 2 L  Skin:  2+ generalized edema Abdomen incision Chest incision - with wound VAC  Diet Order: NPO  EDUCATION NEEDS: -Education needs addressed. Provided many handouts on heart healthy eating, including label reading tips per patient request on 8/15.   Intake/Output Summary (Last 24 hours) at 12/25/12 0933 Last data filed at 12/25/12 0706  Gross per 24 hour  Intake 6700.71 ml  Output   5215 ml  Net 1485.71 ml    Last BM: 8/15  Labs:   Recent Labs Lab 12/23/12 0402  12/24/12 0500 12/24/12 1507 12/25/12 0339  NA 133*  < > 130* 131* 131*  K 3.7  < > 4.1 3.8 3.8  CL 98  < > 94* 96 97  CO2 29  < > 27 29 31   BUN 22  < > 13 14 17   CREATININE 0.76  < > <0.20* 0.62 0.72  CALCIUM 8.2*  < > 8.1* 8.3* 8.1*  MG 2.3  --  1.8  --  2.1  PHOS 2.1*  --  1.4*  --  1.9*  GLUCOSE 126*  < > 301* 155* 142*  < > = values in this interval not displayed.  CBG (last 3)   Recent Labs  12/24/12 1955 12/25/12 0006 12/25/12 0336  GLUCAP 185* 171* 124*   Prealbumin  Date/Time Value Range Status  12/24/2012  5:00 AM 6.2* 17.0 - 34.0 mg/dL Final     Performed at [facility]   Triglycerides  Date/Time Value Range Status  12/25/2012  3:39 AM 159* <150 mg/dL Final    Scheduled Meds: . aminocaproic acid (AMICAR) for OHS   Intravenous To OR  . [facility] HOLD] antiseptic oral rinse  15 mL Mouth Rinse QID  . [facility] HOLD] aspirin  324 mg Per Tube Daily  . [facility] HOLD] aspirin  300 mg Rectal Daily  . [facility] HOLD] bisacodyl  10 mg Oral Daily   Or  . [facility] HOLD] bisacodyl  10 mg Rectal Daily  . [[facility] HOLD] cefTAZidime (FORTAZ)  IV  1 g Intravenous Q8H  . cefUROXime (ZINACEF)  IV  750 mg Intravenous To OR  .  [facility] HOLD] chlorhexidine  15 mL Mouth Rinse BID  . dexmedetomidine  0.1-0.7 mcg/kg/hr Intravenous To OR  . [facility] HOLD] docusate  200 mg Per Tube Daily  . DOPamine  2-20 mcg/kg/min Intravenous To OR  . epinephrine  0.5-20 mcg/min Intravenous To OR  . [facility] HOLD] furosemide  40 mg Intravenous Q12H  . furosemide  40 mg Intravenous To OR  . heparin-papaverine-plasmalyte irrigation   Irrigation To OR  . heparin 30,000 units/NS 1000 mL solution for CELLSAVER   Other To OR  . [[facility] HOLD] insulin aspart  0-24 Units Subcutaneous Q4H  . insulin (NOVOLIN-R) infusion   Intravenous To OR  . [facility] HOLD] levalbuterol  1.25 mg Nebulization Q6H  . [[facility] HOLD] magic mouthwash  5 mL Oral QID  . magnesium sulfate  40 mEq Other To OR  . [facility] HOLD] mupirocin ointment  1 application Topical BID  . nitroGLYCERIN  2-200 mcg/min Intravenous To OR  . [[facility] HOLD] pantoprazole (PROTONIX) IV  40 mg Intravenous Q12H  . phenylephrine (NEO-SYNEPHRINE) Adult infusion  30-200 mcg/min Intravenous To OR  . potassium chloride  80 mEq Other To OR  . small volume/piggyback builder   Intravenous Once  . [facility] HOLD] sodium chloride  10 mL Intravenous Q12H  . vancomycin 1000 mg in NS (1000 ml) irrigation for Dr. Cornelius Moras case   Irrigation To OR  . vancomycin  1,250 mg Intravenous To OR  . [facility] HOLD] vancomycin  750 mg Intravenous Q8H    Continuous Infusions: . sodium chloride Stopped (12/24/12 1240)  . sodium chloride Stopped (12/23/12 0700)  . sodium chloride 250 mL (12/24/12 1200)  . sodium chloride 250 mL (12/25/12 0220)  . amiodarone (NEXTERONE PREMIX) 360 mg/200 mL dextrose 30 mg/hr (12/25/12 0758)  . [facility] HOLD] dexmedetomidine 0.6 mcg/kg/hr (12/25/12 0758)  . [[facility] HOLD] DOBUTamine Stopped (12/23/12 1100)  . DOPamine 3 mcg/kg/min (12/25/12 0754)  . [[facility] HOLD] .TPN (CLINIMIX-E) Adult Stopped (12/25/12 8119)   And  . [[facility] HOLD] fat emulsion 240 mL (12/24/12 1745)  . Marland KitchenTPN (CLINIMIX-E) Adult     And  . fat emulsion    . [facility]  HOLD] fentaNYL infusion INTRAVENOUS 125 mcg/hr (12/25/12 0758)  . heparin Stopped (12/25/12 0600)  . lactated ringers 10 mL/hr at 12/24/12 1300  . [facility] HOLD] midazolam (VERSED) infusion 4 mg/hr (12/25/12 0758)  . nitroGLYCERIN Stopped (12/18/12 1705)  . norepinephrine (LEVOPHED) Adult infusion 5 mcg/min (12/25/12 0754)  . [[facility] HOLD] phenylephrine (NEO-SYNEPHRINE) Adult infusion Stopped (12/24/12 0000)    Jarold Motto MS, RD, LDN Pager: [phone] After-hours pager: [phone]

## 2012-12-25 NOTE — Progress Notes (Signed)
. HeartMate 2 Rounding Note  Subjective:   Postop day #7 HeartMate 2 implantation with subsequent emergency centri- mag RVAD because of the RV dysfunction. Patient with stable hemodynamics on BiVAD support. Responsive but comfortable with IV sedation.   RVAD removed in OR today but sternum left open with VAC Pt now on epi/NE/nitric oxide for RV  Patient has dev sig bloody NG drainage req transfusion 3-4 units in pst 36hrs. Heparin held postop RVAD removal Cont BID protonix  . Continue antibiotics- chest is openlow-grade temperature and leukocytosis, sputum and urine and blood cultures pending- all negative so far. Will place feeding tube- postpyloric- tomorrow if UGI bleeding better  LVAD INTERROGATION:  HeartMate II LVAD:  Flow 31 liters/min, speed 8400 power 3.2, PI 4.8.   Objective:    Vital Signs:   Temp:  [97.2 F (36.2 C)-100.2 F (37.9 C)] 98.6 F (37 C) (08/25 1730) Pulse Rate:  [53-100] 99 (08/25 1535) Resp:  [0-21] 0 (08/25 1730) BP: (84)/(77) 84/77 mmHg (08/25 1535) SpO2:  [100 %] 100 % (08/25 0700) FiO2 (%):  [40 %-60 %] 50 % (08/25 1536) Weight:  [195 lb 5.2 oz (88.6 kg)] 195 lb 5.2 oz (88.6 kg) (08/25 0645) Last BM Date: 12/15/12 Mean arterial Pressure 84  Intake/Output:   Intake/Output Summary (Last 24 hours) at 12/25/12 1742 Last data filed at 12/25/12 1734  Gross per 24 hour  Intake 9620.38 ml  Output   6920 ml  Net 2700.38 ml     Physical Exam: General: Intubated in ICU on right ventricular assist device, sedated and comfortable HEENT: normal Neck: supple. JVP normal  No lymphadenopathy or thryomegaly appreciated. Cor: Mechanical heart sounds with LVAD hum present. Lungs: clear Abdomen: soft, nontender, nondistended. No hepatosplenomegaly. No bruits or masses. Good bowel sounds. Extremities: no cyanosis, clubbing, rash, edema extremities warm Neuro: moves all 4 extremities but now is on sedation protocol while on ventilator  Telemetry: AV paced  90 per minute  Labs: Basic Metabolic Panel:  Recent Labs Lab 12/21/12 0420  12/22/12 0432  12/23/12 0402 12/23/12 1445  12/24/12 0500 12/24/12 1507 12/25/12 0339 12/25/12 0840 12/25/12 0958 12/25/12 1110 12/25/12 1331  NA 132*  < > 133*  < > 133* 133*  < > 130* 131* 131* 132* 132* 133* 136  K 3.5  < > 3.4*  < > 3.7 4.2  < > 4.1 3.8 3.8 4.1 3.9 3.8 3.3*  CL 98  --  97  < > 98 98  --  94* 96 97  --   --   --  104  CO2 25  --  28  < > 29 28  --  27 29 31   --   --   --  23  GLUCOSE 115*  --  115*  < > 126* 122*  < > 301* 155* 142* 169* 142* 152* 160*  BUN 16  --  18  < > 22 19  --  13 14 17   --   --   --  18  CREATININE 0.97  --  0.93  < > 0.76 0.77  --  <0.20* 0.62 0.72  --   --   --  0.70  CALCIUM 8.5  --  8.3*  < > 8.2* 8.7  --  8.1* 8.3* 8.1*  --   --   --  6.9*  MG 1.9  --  1.9  --  2.3  --   --  1.8  --  2.1  --   --   --   --  PHOS 2.6  --  2.5  --  2.1*  --   --  1.4*  --  1.9*  --   --   --   --   < > = values in this interval not displayed.  Liver Function Tests:  Recent Labs Lab 12/21/12 0420 12/22/12 0432 12/23/12 0402 12/24/12 0500 12/25/12 0339  AST 58* 39* 31 28 34  ALT 20 18 16 15 18   ALKPHOS 110 85 94 80 78  BILITOT 6.0* 4.5* 1.8* 1.2 1.1  PROT 4.9* 4.9* 4.8* 4.3* 4.3*  ALBUMIN 2.7* 2.4* 2.3* 1.9* 1.9*   No results found for this basename: LIPASE, AMYLASE,  in the last 168 hours No results found for this basename: AMMONIA,  in the last 168 hours  CBC:  Recent Labs Lab 12/19/12 0311  12/20/12 0400  12/21/12 0420  12/23/12 0402 12/23/12 1600  12/24/12 0500  12/25/12 0339 12/25/12 0840 12/25/12 0958 12/25/12 1110 12/25/12 1331  WBC 6.9  < > 11.1*  < > 13.4*  < > 14.4* 20.3*  --  17.2*  --  19.3*  --   --   --  30.3*  NEUTROABS 5.4  --  9.0*  --  11.5*  --   --   --   --  12.9*  --  15.1*  --   --   --   --   HGB 6.5*  < > 8.8*  < > 9.3*  < > 8.4* 7.9*  < > 7.5*  < > 7.3* 8.2* 8.8* 10.5* 7.5*  HCT 18.3*  < > 25.0*  < > 26.7*  < > 24.5*  22.4*  < > 22.5*  < > 21.3* 24.0* 26.0* 31.0* 21.4*  MCV 85.5  < > 82.2  < > 84.5  < > 86.3 86.5  --  87.5  --  87.3  --   --   --  84.9  PLT 103*  < > 104*  < > 94*  < > 117* 168  --  158  --  178  --   --   --  89*  < > = values in this interval not displayed.  INR:  Recent Labs Lab 12/19/12 0812 12/19/12 1545 12/20/12 0400 12/21/12 0420 12/22/12 0432  INR 1.42 1.30 1.37 1.10 1.13    Other results:  EKG:   Imaging: Dg Chest Port 1 View  12/25/2012   *RADIOLOGY REPORT*  Clinical Data: Status post removal of right ventricular assist device.  Addition of Swan-Ganz catheter.  PORTABLE CHEST - 1 VIEW  Comparison: Chest x-ray 12/25/2012.  Findings: Previously noted cannula from right ventricular assist device has been removed.  Left ventricular assist device remains in position with a inflow cannula oriented in line with the expected left ventricular long axis.  Bilateral chest tubes are in position. There has been interval placement of a Swan-Ganz catheter with tip in the proximal right pulmonary artery, the left internal jugular approach.  Previously noted right IJ Cordis and right IJ central venous catheter remain in position, and there has also been placement of a right subclavian central venous catheter.  The tips of these catheters are difficult to localize secondary to overlapping structures, but they appear to be centrally positioned within the vena cava. An endotracheal tube is in place with tip 2.4 cm above the carina. Left-sided pacemaker / AICD with lead tip terminating in the right ventricular apex.  Status post tricuspid annuloplasty. A nasogastric tube is seen extending into  the stomach, however, the tip of the nasogastric tube extends below the lower margin of the image.  Mediastinal drain is also noted.  The lung volumes are low.  No definite pneumothorax.  No definite acute consolidative airspace disease.  Patchy linear opacities throughout the mid lungs bilaterally and in the  left base are most compatible with subsegmental atelectasis.  Small left pleural effusion.  No evidence of pulmonary edema.  Heart size remains mildly enlarged. The patient is rotated to the left on today's exam, resulting in distortion of the mediastinal contours and reduced diagnostic sensitivity and specificity for mediastinal pathology.  Atherosclerosis in the thoracic aorta.  IMPRESSION: 1.  Postoperative changes and support apparatus, as above. 2.  Low lung volumes with improving aeration throughout the lungs bilaterally, likely represent resolving postoperative atelectasis and resolution of some postoperative edema.   Original Report Authenticated By: Trudie Reed, M.D.   Dg Chest Port 1 View  12/25/2012   *RADIOLOGY REPORT*  Clinical Data: Ventricular assist device  PORTABLE CHEST - 1 VIEW  Comparison: Prior chest x-ray 12/24/2012  Findings: The patient remains intubated.  The tip of the endotracheal tube is 2.9 cm above the carina.  A right IJ vascular sheath conducts a Swan-Ganz catheter into the heart.  The tip of the Swan-Ganz catheter overlies the distal right main pulmonary artery.  The left subclavian approach cardiac rhythm maintenance device is in unchanged position.  Grossly stable positioning of the incompletely imaged ventricular assist device.  Bilateral thoracostomy tubes remain in place.  Right IJ central venous catheter is present with the tip at the superior cavoatrial junction.  Significantly decreased aeration with new peripheral left upper lobe opacity and increased bibasilar opacities. Background of pulmonary edema.  IMPRESSION:  1.  Stable and satisfactory support apparatus. 2.  Significantly decreased aeration with new peripheral left upper lung opacity, lower lung volumes and bilateral pleural effusions with associated bibasilar atelectasis versus infiltrate. 3.  Mild pulmonary edema.   Original Report Authenticated By: Malachy Moan, M.D.   Dg Chest Port 1  View  12/24/2012   *RADIOLOGY REPORT*  Clinical Data: Status post ventricular assist device placement.  PORTABLE CHEST - 1 VIEW  Comparison: 12/23/2012  Findings: The left ventricular assist device, bilateral chest tubes, the endotracheal tube, right internal jugular Swan-Ganz catheter and right internal jugular central venous line and nasogastric tube are all stable and well positioned.  Atelectasis adjacent to the left and right chest tubes as well as at the medial lung bases, appears slightly improved.  The lungs are otherwise clear.  No gross pneumothorax on this semi-erect study.  There is no mediastinal widening.  IMPRESSION: Mild improvement in the lung atelectasis.  No pulmonary edema or pneumothorax.  No evidence of an operative complication.  Support apparatus is stable well-positioned.   Original Report Authenticated By: Amie Portland, M.D.     Medications:     Scheduled Medications: . antiseptic oral rinse  15 mL Mouth Rinse QID  . aspirin  324 mg Per Tube Daily  . aspirin  300 mg Rectal Daily  . bisacodyl  10 mg Oral Daily   Or  . bisacodyl  10 mg Rectal Daily  . cefTAZidime (FORTAZ)  IV  1 g Intravenous Q8H  . chlorhexidine  15 mL Mouth Rinse BID  . docusate  200 mg Per Tube Daily  . furosemide  40 mg Intravenous Q12H  . insulin aspart  0-24 Units Subcutaneous Q4H  . levalbuterol  1.25 mg Nebulization Q6H  .  magic mouthwash  5 mL Oral QID  . mupirocin ointment  1 application Topical BID  . pantoprazole (PROTONIX) IV  40 mg Intravenous Q12H  . potassium chloride  10 mEq Intravenous Q1 Hr x 4  . small volume/piggyback builder   Intravenous Once  . sodium chloride  10 mL Intravenous Q12H  . vancomycin  750 mg Intravenous Q8H    Infusions: . sodium chloride Stopped (12/24/12 1240)  . sodium chloride Stopped (12/23/12 0700)  . sodium chloride 250 mL (12/24/12 1200)  . sodium chloride 250 mL (12/25/12 0220)  . amiodarone (NEXTERONE PREMIX) 360 mg/200 mL dextrose 30 mg/hr  (12/25/12 1534)  . dexmedetomidine 0.6 mcg/kg/hr (12/25/12 1546)  . DOBUTamine Stopped (12/23/12 1100)  . DOPamine 2.991 mcg/kg/min (12/25/12 1534)  . epinephrine 8 mcg/min (12/25/12 1549)  . Marland KitchenTPN (CLINIMIX-E) Adult Stopped (12/25/12 1610)   And  . fat emulsion 240 mL (12/24/12 1745)  . fentaNYL infusion INTRAVENOUS 125 mcg/hr (12/25/12 0758)  . lactated ringers 10 mL/hr at 12/24/12 1300  . midazolam (VERSED) infusion 4 mg/hr (12/25/12 1546)  . nitroGLYCERIN Stopped (12/18/12 1705)  . norepinephrine (LEVOPHED) Adult infusion 8 mcg/min (12/25/12 1548)  . phenylephrine (NEO-SYNEPHRINE) Adult infusion Stopped (12/24/12 0000)    PRN Medications: acetaminophen (TYLENOL) oral liquid 160 mg/5 mL, diphenhydrAMINE, fentaNYL, midazolam, morphine injection, ondansetron (ZOFRAN) IV, ondansetron (ZOFRAN) IV, sodium chloride, sodium chloride   Assessment:  1 status post implantable LVAD with subsequent severe RV dysfunction requiring right ventricular assist device- now removed after 1 week support 2 coagulopathy and bleeding requiring transfusion now improved 3 UGI bleeding Plan/Discussion:   Hold heparin, stop TPN  Support native RV fx   I reviewed the LVAD parameters from today, and compared the results to the patient's prior recorded data.  No programming changes were made.  The LVAD is functioning within specified parameters.  The patient performs LVAD self-test daily.  LVAD interrogation was negative for any significant power changes, alarms or PI events/speed drops.  LVAD equipment check completed and is in good working order.  Back-up equipment present.   LVAD education done on emergency procedures and precautions and reviewed exit site care.  Length of Stay: 17  [name] III,PETER 12/25/2012, 5:42 PM

## 2012-12-25 NOTE — Brief Op Note (Signed)
12/08/2012 - 12/25/2012  12:57 PM  PATIENT:  Kaitlin [name]  67 y.o. female  PRE-OPERATIVE DIAGNOSIS:  Idiopathic Cardiomyopathy  POST-OPERATIVE DIAGNOSIS:  Idiopathic Cardiomyopathy  PROCEDURE:  Procedure(s) with comments: REMOVAL OF CENTRIMAG VENTRICULAR ASSIST DEVICE (N/A) - PUMP STANDBY INTRAOPERATIVE TRANSESOPHAGEAL ECHOCARDIOGRAM (N/A) Placement of PA Swan catheter  SURGEON:  Surgeon(s) and Role:    * Kerin Perna, MD - Primary  PHYSICIAN ASSISTANT: 0  ASSISTANTS: {TOW RNFA   ANESTHESIA:   general  EBL:  Total I/O In: 3163.4 [I.V.:73.4; Blood:1330; NG/GT:1700; TPN:60] Out: 4100 [Urine:1300; Emesis/NG output:1700; Blood:1100]  BLOOD ADMINISTERED:150 CC PRBC  DRAINS: 2 MT, 2 PT  LOCAL MEDICATIONS USED:  NONE  SPECIMEN:  No Specimen  DISPOSITION OF SPECIMEN:  N/A  COUNTS:  YES  TOURNIQUET:  * No tourniquets in log *  DICTATION: .Dragon Dictation  PLAN OF CARE: Admit to inpatient   PATIENT DISPOSITION:  ICU - intubated and critically ill.   Delay start of Pharmacological VTE agent (>24hrs) due to surgical blood loss or risk of bleeding: yes

## 2012-12-25 NOTE — Anesthesia Postprocedure Evaluation (Signed)
  Anesthesia Post-op Note  Patient: Kaitlin [name]  Procedure(s) Performed: Procedure(s) with comments: REMOVAL OF CENTRIMAG VENTRICULAR ASSIST DEVICE (N/A) - PUMP STANDBY INTRAOPERATIVE TRANSESOPHAGEAL ECHOCARDIOGRAM (N/A)  Patient Location: SICU  Anesthesia Type:General  Level of Consciousness: sedated, unresponsive and Patient remains intubated per anesthesia plan  Airway and Oxygen Therapy: Patient remains intubated per anesthesia plan and Patient placed on Ventilator (see vital sign flow sheet for setting)  Post-op Pain: none  Post-op Assessment: Post-op Vital signs reviewed and Patient's Cardiovascular Status Stable  Post-op Vital Signs: stable  Complications: No apparent anesthesia complications

## 2012-12-25 NOTE — Progress Notes (Signed)
  Echocardiogram Echocardiogram Transesophageal has been performed.  Georgian Co 12/25/2012, 9:21 AM

## 2012-12-25 NOTE — Anesthesia Preprocedure Evaluation (Addendum)
Anesthesia Evaluation  Patient identified by MRN, date of birth, ID bandGeneral Assessment Comment:Patient sedated with Precedex, Fentanyl, and Versed but responsive, moves all extremities  History of Anesthesia Complications Negative for: history of anesthetic complications  Airway      Comment: 8.0 ETT in situ Dental  (+) Edentulous Upper and Edentulous Lower   Pulmonary          Cardiovascular hypertension, Pt. on medications and Pt. on home beta blockers +CHF + dysrhythmias (Biotronik AICD placement 2009, s/p SVT ablation 07/15/12 Dr. Ladona Ridgel), now on Amiodarone) Atrial Fibrillation and Ventricular Tachycardia  Status post implantable heartmate LVAD (12/18/12) with subsequent severe RV dysfunction requiring centri-mag right ventricular assist device. Tricuspid ring placed with LVAD insertion.    Neuro/Psych    GI/Hepatic   Endo/Other    Renal/GU Renal InsufficiencyRenal diseaseCreatinine now 0.7, on IV lasix     Musculoskeletal   Abdominal   Peds  Hematology Last Hgb 7.9, receiving PRBCs in SICU. Heparin stopped 0600 due to bleeding from NG tube - last ACT 150, PTT 100   Anesthesia Other Findings   Reproductive/Obstetrics                          Anesthesia Physical Anesthesia Plan  ASA: IV  Anesthesia Plan: General   Post-op Pain Management:    Induction: Intravenous and Inhalational  Airway Management Planned:   Additional Equipment:   Intra-op Plan:   Post-operative Plan:   Informed Consent:   Plan Discussed with: Surgeon  Anesthesia Plan Comments:         Anesthesia Quick Evaluation

## 2012-12-25 NOTE — CV Procedure (Signed)
Intra-operative Transesophageal Echocardiography Report:  Kaitlin [name] is a 67 year old female with a history of nonischemic cardiomyopathy who is status post HeartMate II left ventricular assist device insertion and tricuspid valve repair on 18  August. This procedure was complicated by right ventricular failure  requiring return to the OR for insertion of a centrimag right ventricular assist device. The patient now returns to the operating room for weaning and removal of the right ventricular cyst device. Intraoperative transesophageal echocardiography was requested to evaluate the right ventricular function and to assess the left ventricular filling and function of the left ventricular cyst device.  The patient was brought to the operating room at [facility]. She had been intubated and mechanically ventilated since her previous surgery. General anesthesia was administered. The transesophageal echocardiography probe was inserted into the esophagus without difficulty.  Impression: Pre-weaning of the right ventricular cyst device:  1. Aortic valve: The aortic valve was trileaflet. The leaflets were not opening. There was no aortic insufficiency.  2. Mitral valve: The mitral valve appeared to open normally. There was 1+ mitral insufficiency with a central jet.  3. Left ventricle: The left ventricular cavity appeared to have adequate filling.. The left ventricular assist device inflow cannula was in the apex and oriented toward the left ventricular septum. However there was laminar flow within the cannula. There was no thrombus noted  within the left ventricular cavity. Left ventricular end-diastolic diameter measured 43 mm. There was thinning of the interventricular septum with akinesis of the interventricular septum and inferior wall. There was hypokinesis of the anterior and  lateral walls.   4. Right ventricle: The right ventricular cavity was reduced in size and there appeared to be  reduced contractility of the right ventricular free wall which was difficult to assess because of the right ventricular filling was markedly decreased.  5. Tricuspid valve: The tricuspid valve showed an annuloplasty ring. There was 2+ tricuspid insufficiency.  6. Interatrial septum: Interatrial septum was intact without evidence of patent foramen ovale or atrial septal defect by color Doppler. The septum was undulating and oriented predominately left to right.  7. Interventricular septum: The interventricular septum was intact without evidence of atrial a ventricular septal defect the color Doppler.  8. Right atrium: The central mag inflow cannula was visualized within the right atrial cavity. There was flow directed into the cannula with mild turbulence.  9. Right ventricular outflow tract: The RVAD outflow cannula was visualized in the main pulmonary artery distal to the pulmonic valve.  10. Pulmonicy valve:  There was no evidence of pulmonic insufficiency that could be appreciated by color Doppler.  11. Left Atrium: There was no thrombus noted in the left atrial cavity or left atrial appendage.  Post-RVAD Weaning:  1. Aortic valve: The aortic valve appeared unchanged from the pre-weaning study. The leaflets did not open and there was no aortic insufficiency.  2.  Mitral valve: There was 1+ mitral insufficiency which appeared unchanged from the pre-bypass study.  3. Left ventricle: With weaning of the right ventricular assist device, the left ventricular volume  was decreased. However the LVAD inflow cannula flow remained laminar. The inter-ventricular septum was assessed and appeared to flatten and move in a right to left direction depending on pump speed and volume status of the patient.   4. Right ventricle: The right ventricular cavity was increased in size. There was reduced contractility the right ventricular free wall. However this right ventricular function appeared improved from  the previous study done in  the OR on 8/18.  5. Tricuspid valve: The and there was an annuloplasty ring in the tricuspid position. There was 1-2+ tricuspid insufficiency.  6. Interatrial septum: Interatrial septum was bowed right to left with some undulation in the leftward direction. There was no evidence of patent foramen ovale or atrial septal defect by color.  Kipp Brood, MD

## 2012-12-25 NOTE — Transfer of Care (Signed)
Immediate Anesthesia Transfer of Care Note  Patient: Kaitlin [name]  Procedure(s) Performed: Procedure(s) with comments: REMOVAL OF CENTRIMAG VENTRICULAR ASSIST DEVICE (N/A) - PUMP STANDBY INTRAOPERATIVE TRANSESOPHAGEAL ECHOCARDIOGRAM (N/A)  Patient Location: SICU  Anesthesia Type:General  Level of Consciousness: Patient remains intubated per anesthesia plan  Airway & Oxygen Therapy: Patient remains intubated per anesthesia plan and Patient placed on Ventilator (see vital sign flow sheet for setting)  Post-op Assessment: Report given to PACU RN and Post -op Vital signs reviewed and stable  Post vital signs: Reviewed and stable  Complications: No apparent anesthesia complications

## 2012-12-25 NOTE — Progress Notes (Signed)
PARENTERAL NUTRITION CONSULT NOTE - Follow Up  Pharmacy Consult for TPN Indication: TF intolerance with emesis  No Known Allergies  Patient Measurements: Height: 5\' 9"  (175.3 cm) Weight: 195 lb 5.2 oz (88.6 kg) IBW/kg (Calculated) : 66.2 (22% above IBW)  Other Metrics Adjusted weight 72.1 kg  Nutritional weight 69.9 kg  Lean body weight 49.3 kg  BMI 26.3 kg/m2  BSA 1.98 m2   Vital Signs: Temp: 97.2 F (36.2 C) (08/25 0700) Temp src: Core (Comment) (08/25 0515) Pulse Rate: 68 (08/25 0700) Intake/Output from previous day: 08/24 0701 - 08/25 0700 In: 5105.2 [I.V.:2467.9; Blood:645; NG/GT:120; IV Piggyback:600; TPN:1272.3] Out: 3760 [Urine:3100; Emesis/NG output:350; Chest Tube:310] Intake/Output from this shift: Total I/O In: 1833.4 [I.V.:73.4; NG/GT:1700; TPN:60] Out: 1700 [Emesis/NG output:1700]  Labs:  Recent Labs  12/23/12 1600  12/24/12 0500 12/24/12 0820 12/24/12 1507 12/25/12 0043 12/25/12 0339  WBC 20.3*  --  17.2*  --   --   --  19.3*  HGB 7.9*  < > 7.5*  --  9.0*  --  7.3*  HCT 22.4*  < > 22.5*  --  26.2*  --  21.3*  PLT 168  --  158  --   --   --  178  APTT  --   < > 107* 71* 79* 100*  --   < > = values in this interval not displayed.   Recent Labs  12/23/12 0402  12/24/12 0500 12/24/12 1507 12/25/12 0339  NA 133*  < > 130* 131* 131*  K 3.7  < > 4.1 3.8 3.8  CL 98  < > 94* 96 97  CO2 29  < > 27 29 31   GLUCOSE 126*  < > 301* 155* 142*  BUN 22  < > 13 14 17   CREATININE 0.76  < > <0.20* 0.62 0.72  CALCIUM 8.2*  < > 8.1* 8.3* 8.1*  MG 2.3  --  1.8  --  2.1  PHOS 2.1*  --  1.4*  --  1.9*  PROT 4.8*  --  4.3*  --  4.3*  ALBUMIN 2.3*  --  1.9*  --  1.9*  AST 31  --  28  --  34  ALT 16  --  15  --  18  ALKPHOS 94  --  80  --  78  BILITOT 1.8*  --  1.2  --  1.1  PREALBUMIN  --   --  6.2*  --   --   TRIG  --   --  119  --  159*  < > = values in this interval not displayed. Estimated Creatinine Clearance: 83.2 ml/min (by C-G formula based on Cr  of 0.72).    Recent Labs  12/24/12 1955 12/25/12 0006 12/25/12 0336  GLUCAP 185* 171* 124*    Medical History: Past Medical History  Diagnosis Date  . Paroxysmal supraventricular tachycardia   . Hypokalemia   . Chronic systolic heart failure     a. 08/979 Echo: EF 15%, mod to sev antlat and inflat HK, inf AK, mild to mod MR, mildly/mod reduced RV fxn, Mod TR, PASP .  [name] History of DVT (deep vein thrombosis)   . Dyslipidemia   . HTN (hypertension)   . Nonischemic cardiomyopathy     a. 06/2012 Echo: EF 15%;  b. 06/2012 Cath: nl except luminal irregs in LAD.  [name] Ventricular tachycardia     a. s/p ICD  . Goiter   . Implantable cardiac defibrillator- Biotronik  Single chamber  . [name] (left bundle branch block)     intermittent  . Palliative care patient    Insulin Requirements in the past 24 hours:  She has no history of diabetes but received 10 units of Novolog in the last 24 hours for CBG's of 124-185  Current Nutrition:  TPN at 50 ml/hr and lipids at 10 ml/hr 1351 kCal and 60 gm protein.  Assessment: This is a 67 yo female with cardiac disease who recently underwent LVAD treatment.  She has severe heart failure and is undergoing gentle duresis.  She had been receiving tube feeds for nutritional support but has been unable to tolerate with 200 cc of emesis.  TF's have been on hold and the plan is to use TPN short term to prevent nutritional deficit issues.   8/25 - She tolerated TPN overnight but shows some electrolyte abnormalities this morning with low Na and phos.  Mag 2.1, K 3.8.  Plan OR today mediastinal irrigation and poss RVAD wean  Nutritional Goals:  Her estimated needs are: (IBW 66kg) - 1626 kcal (TBW 80.9kg) - 112-131 8/25-We will not meet her protein requirements with the pre-mixed TPN solutions.  Would consider post-pyloric feeds if you feel PO intake will be prolonged for a while to ensure adequate nutritional support.  GI: She is slightly overweight  with BMI of 26.3 but no noted GI issues. Lytes:  Low sodium and phosphorous this morning - Will give bolus of phosphorous. Renal:  Renal function is good with creat. of 0.72 and an estimated crcl of 51ml/min Pulm:   Currently intubated with some sedation but responsive to questions Cards:  Post-op day #6 of LVAD with BiVad support.  Going to OR today Hepatobil:  T.Bili 1.1, low albumin and total protein.  TG 159, LDH 424 Neuro:   No neurological deficits noted ID:   Afeb now (Vanc/Ceftazidime) - culture data non-revealing, still with some leukocytosis  Best Practices:  Receiving IV heparin and IV PPI TPN Access: CVC Double Lumen Right Internal jugular - 12/18/12 >>  TPN day # 2  Plan:  1.  Will  Continue Clinimix 5/15 E at 50 ml/hr 2.  Cont  lipids at 37ml/hr 3.  Continue current SSI coverage 4.  Phos bolus 15 mmol 4.  F/U labs and adjust as needed.  Talbert Cage, PharmD Clinical Pharmacist Pager:  [phone] Thank you for allowing pharmacy to be part of this patients care team. 12/25/2012,7:38 AM

## 2012-12-25 NOTE — Progress Notes (Signed)
Respiratory therapy note- has been transported to and from OR with Nitric running. This has increased to 35 ppm per Dr. Maren Beach. Continue to monitor. Patient was placed back to original ventilator settings with fio2 increased to 60%.

## 2012-12-25 NOTE — OR Nursing (Signed)
SICU First call @ 1123

## 2012-12-25 NOTE — Progress Notes (Signed)
Dr Jones Broom re-contacted r/t cont' low P.I. Readings on Heartmate II, reviewed previous interventions and Pt current RVAD/LVAD readings as well as hemodynamics; orders received to transfused PRBC and to contact Dr Donata Clay to update Pt's situation.

## 2012-12-25 NOTE — Progress Notes (Signed)
The patient was examined and preop studies reviewed. There has been no change from the prior exam and the patient is ready for surgery. Plan mediastinal irrigation, poss RVAD wean on Kaitlin [name]

## 2012-12-25 NOTE — OR Nursing (Signed)
Verbal order from Dr. Donata Clay to make sure AICD is turned off.  Anesthesia aware, Dr. Noreene Larsson states that AICD is turned off at this time.  Elby Showers, RN

## 2012-12-25 NOTE — Progress Notes (Signed)
  I accompanied patient to OR with Dr. Donata Clay and Dr. Noreene Larsson for RVAD wean.   While in OR she was fully anti-coagulated and RVAD speed turned down in sequential fashion. The RVAD cannulas were eventually clamped and patient monitored for 30 minutes. We monitored CVPs, RVAD/LVAD flows, TEE and co-ox closely.  Inotropes were titrated and albumin and RBCs were infused as needed. e cannulas were then explanted and monitoring was continued. It was felt that RV function was sufficient to close the chest without further RVAD support.   She will be returned back to SICU for close monitoring.   Total CCT > 60 minutes.  Daniel Bensimhon,MD 12:17 PM

## 2012-12-26 ENCOUNTER — Inpatient Hospital Stay (HOSPITAL_COMMUNITY): Payer: Commercial Managed Care - PPO

## 2012-12-26 ENCOUNTER — Encounter (HOSPITAL_COMMUNITY)
Admission: RE | Disposition: A | Discharge status: Home Health | Payer: Self-pay | Source: Ambulatory Visit | Attending: Cardiothoracic Surgery

## 2012-12-26 ENCOUNTER — Encounter (HOSPITAL_COMMUNITY): Payer: Self-pay | Admitting: Physician Assistant

## 2012-12-26 DIAGNOSIS — Z7901 Long term (current) use of anticoagulants: Secondary | ICD-10-CM

## 2012-12-26 DIAGNOSIS — Z95818 Presence of other cardiac implants and grafts: Secondary | ICD-10-CM

## 2012-12-26 DIAGNOSIS — I5023 Acute on chronic systolic (congestive) heart failure: Secondary | ICD-10-CM

## 2012-12-26 DIAGNOSIS — K226 Gastro-esophageal laceration-hemorrhage syndrome: Secondary | ICD-10-CM

## 2012-12-26 DIAGNOSIS — I428 Other cardiomyopathies: Secondary | ICD-10-CM

## 2012-12-26 HISTORY — PX: ESOPHAGOGASTRODUODENOSCOPY: SHX5428

## 2012-12-26 LAB — PREPARE PLATELET PHERESIS: Unit division: 0

## 2012-12-26 LAB — POCT I-STAT 3, ART BLOOD GAS (G3+)
Bicarbonate: 24.2 mEq/L — ABNORMAL HIGH (ref 20.0–24.0)
Bicarbonate: 23.6 mEq/L (ref 20.0–24.0)
O2 Saturation: 99 %
Patient temperature: 38.1
TCO2: 25 mmol/L (ref 0–100)
pCO2 arterial: 45.4 mmHg — ABNORMAL HIGH (ref 35.0–45.0)
pH, Arterial: 7.329 — ABNORMAL LOW (ref 7.350–7.450)
pO2, Arterial: 168 mmHg — ABNORMAL HIGH (ref 80.0–100.0)
pO2, Arterial: 143 mmHg — ABNORMAL HIGH (ref 80.0–100.0)

## 2012-12-26 LAB — CARBOXYHEMOGLOBIN
Carboxyhemoglobin: 1.3 % (ref 0.5–1.5)
Carboxyhemoglobin: 1.7 % — ABNORMAL HIGH (ref 0.5–1.5)
Methemoglobin: 1.7 % — ABNORMAL HIGH (ref 0.0–1.5)
Methemoglobin: 2.2 % — ABNORMAL HIGH (ref 0.0–1.5)
O2 Saturation: 59.3 %
O2 Saturation: 70 %
Total hemoglobin: 10.8 g/dL — ABNORMAL LOW (ref 12.0–16.0)
Total hemoglobin: 8 g/dL — ABNORMAL LOW (ref 12.0–16.0)

## 2012-12-26 LAB — GLUCOSE, CAPILLARY
Glucose-Capillary: 111 mg/dL — ABNORMAL HIGH (ref 70–99)
Glucose-Capillary: 131 mg/dL — ABNORMAL HIGH (ref 70–99)
Glucose-Capillary: 132 mg/dL — ABNORMAL HIGH (ref 70–99)

## 2012-12-26 LAB — CBC
HCT: 25.2 % — ABNORMAL LOW (ref 36.0–46.0)
HCT: 22.5 % — ABNORMAL LOW (ref 36.0–46.0)
Hemoglobin: 9 g/dL — ABNORMAL LOW (ref 12.0–15.0)
Hemoglobin: 7.9 g/dL — ABNORMAL LOW (ref 12.0–15.0)
MCH: 30.1 pg (ref 26.0–34.0)
MCH: 29.9 pg (ref 26.0–34.0)
MCHC: 35.7 g/dL (ref 30.0–36.0)
MCHC: 35.1 g/dL (ref 30.0–36.0)
MCV: 84.3 fL (ref 78.0–100.0)
MCV: 85.2 fL (ref 78.0–100.0)
Platelets: 262 10*3/uL (ref 150–400)
Platelets: 275 10*3/uL (ref 150–400)
RBC: 2.99 MIL/uL — ABNORMAL LOW (ref 3.87–5.11)
RBC: 2.64 MIL/uL — ABNORMAL LOW (ref 3.87–5.11)
RDW: 15.4 % (ref 11.5–15.5)
RDW: 15.9 % — ABNORMAL HIGH (ref 11.5–15.5)
WBC: 23.1 10*3/uL — ABNORMAL HIGH (ref 4.0–10.5)
WBC: 23.9 10*3/uL — ABNORMAL HIGH (ref 4.0–10.5)

## 2012-12-26 LAB — MAGNESIUM: Magnesium: 1.8 mg/dL (ref 1.5–2.5)

## 2012-12-26 LAB — PREPARE FRESH FROZEN PLASMA

## 2012-12-26 LAB — COMPREHENSIVE METABOLIC PANEL
ALT: 11 U/L (ref 0–35)
AST: 26 U/L (ref 0–37)
Albumin: 2.8 g/dL — ABNORMAL LOW (ref 3.5–5.2)
Alkaline Phosphatase: 78 U/L (ref 39–117)
BUN: 24 mg/dL — ABNORMAL HIGH (ref 6–23)
CO2: 23 mEq/L (ref 19–32)
Calcium: 8.5 mg/dL (ref 8.4–10.5)
Chloride: 98 mEq/L (ref 96–112)
Creatinine, Ser: 0.84 mg/dL (ref 0.50–1.10)
GFR calc Af Amer: 83 mL/min — ABNORMAL LOW (ref >90)
GFR calc non Af Amer: 71 mL/min — ABNORMAL LOW (ref >90)
Glucose, Bld: 142 mg/dL — ABNORMAL HIGH (ref 70–99)
Potassium: 5.2 mEq/L — ABNORMAL HIGH (ref 3.5–5.1)
Sodium: 130 mEq/L — ABNORMAL LOW (ref 135–145)
Total Bilirubin: 2 mg/dL — ABNORMAL HIGH (ref 0.3–1.2)
Total Protein: 4.8 g/dL — ABNORMAL LOW (ref 6.0–8.3)

## 2012-12-26 LAB — BASIC METABOLIC PANEL
BUN: 22 mg/dL (ref 6–23)
CO2: 23 mEq/L (ref 19–32)
Calcium: 7.8 mg/dL — ABNORMAL LOW (ref 8.4–10.5)
Chloride: 101 mEq/L (ref 96–112)
Creatinine, Ser: 0.76 mg/dL (ref 0.50–1.10)
GFR calc Af Amer: >90 mL/min (ref >90)
GFR calc non Af Amer: 87 mL/min — ABNORMAL LOW (ref >90)
Glucose, Bld: 139 mg/dL — ABNORMAL HIGH (ref 70–99)
Potassium: 4.3 mEq/L (ref 3.5–5.1)
Sodium: 131 mEq/L — ABNORMAL LOW (ref 135–145)

## 2012-12-26 LAB — POCT I-STAT 3, VENOUS BLOOD GAS (G3P V)
Bicarbonate: 25.3 mEq/L — ABNORMAL HIGH (ref 20.0–24.0)
pCO2, Ven: 50.6 mmHg — ABNORMAL HIGH (ref 45.0–50.0)
pH, Ven: 7.305 — ABNORMAL HIGH (ref 7.250–7.300)
pO2, Ven: 31 mmHg (ref 30.0–45.0)

## 2012-12-26 LAB — PROTIME-INR
INR: 1.25 (ref 0.00–1.49)
Prothrombin Time: 15.4 seconds — ABNORMAL HIGH (ref 11.6–15.2)

## 2012-12-26 LAB — CALCIUM, IONIZED: Calcium, Ion: 1.22 mmol/L (ref 1.13–1.30)

## 2012-12-26 LAB — VANCOMYCIN, TROUGH
Vancomycin Tr: 36.5 ug/mL (ref 10.0–20.0)
Vancomycin Tr: 22.1 ug/mL — ABNORMAL HIGH (ref 10.0–20.0)

## 2012-12-26 LAB — LACTATE DEHYDROGENASE: LDH: 375 U/L — ABNORMAL HIGH (ref 94–250)

## 2012-12-26 LAB — PHOSPHORUS: Phosphorus: 2.9 mg/dL (ref 2.3–4.6)

## 2012-12-26 LAB — APTT: aPTT: 39 seconds — ABNORMAL HIGH (ref 24–37)

## 2012-12-26 SURGERY — EGD (ESOPHAGOGASTRODUODENOSCOPY)
Anesthesia: Moderate Sedation

## 2012-12-26 MED ORDER — DEXTROSE 5 % IV SOLN
4.0000 mg/h | INTRAVENOUS | Status: DC
  Administered 2012-12-26: 4 mg/h via INTRAVENOUS
  Administered 2012-12-28 – 2012-12-30 (×3): 8 mg/h via INTRAVENOUS
  Administered 2013-01-01: 4 mg/h via INTRAVENOUS
  Filled 2012-12-26 (×8): qty 25

## 2012-12-26 MED ORDER — WARFARIN SODIUM 5 MG IV SOLR
2.5000 mg | Freq: Once | INTRAVENOUS | Status: DC
  Filled 2012-12-26: qty 1.25

## 2012-12-26 MED ORDER — HEPARIN (PORCINE) IN NACL 100-0.45 UNIT/ML-% IJ SOLN
600.0000 [IU]/h | INTRAMUSCULAR | Status: DC
  Administered 2012-12-26: 500 [IU]/h via INTRAVENOUS
  Filled 2012-12-26: qty 250

## 2012-12-26 MED ORDER — SODIUM CHLORIDE 0.9 % IV SOLN: INTRAVENOUS | Status: DC

## 2012-12-26 MED ORDER — SODIUM CHLORIDE 0.9 % IV SOLN
8.0000 mg/h | INTRAVENOUS | Status: AC
  Administered 2012-12-26 – 2012-12-29 (×5): 8 mg/h via INTRAVENOUS
  Filled 2012-12-26 (×13): qty 80

## 2012-12-26 MED ORDER — WARFARIN - PHYSICIAN DOSING INPATIENT
Freq: Every day | Status: DC
  Administered 2012-12-26: 18:00:00

## 2012-12-26 MED ORDER — VANCOMYCIN HCL IN DEXTROSE 1-5 GM/200ML-% IV SOLN
1000.0000 mg | Freq: Two times a day (BID) | INTRAVENOUS | Status: DC
  Administered 2012-12-26 – 2012-12-28 (×4): 1000 mg via INTRAVENOUS
  Filled 2012-12-26 (×6): qty 200

## 2012-12-26 MED ORDER — WARFARIN SODIUM 5 MG IV SOLR
2.5000 mg | Freq: Once | INTRAVENOUS | Status: DC
  Administered 2012-12-26: 2.5 mg via INTRAVENOUS

## 2012-12-26 NOTE — Consult Note (Signed)
Patient seen, examined, and I agree with the above documentation, including the assessment and plan. Critically ill female with HF, s/p LVAD with temporary RVAD removed yesterday.  Began having red blood per OG. Remains on 3 vasopressors, and heparin infusion for LVAD Possible PNA Plan is EGD to eval source of bleeding HIGHER THAN BASELINE RISK.The nature of the procedure, as well as the risks, benefits, and alternatives were carefully and thoroughly reviewed with the daughtert. Ample time for discussion and questions allowed. Her daughter understood, was satisfied, and agreed to proceed.

## 2012-12-26 NOTE — Op Note (Signed)
[facility] [address] [place], [address]   ENDOSCOPY PROCEDURE REPORT  PATIENT: Kaitlin [name], Kaitlin [name]  MR#: [id number] BIRTHDATE: [birth year] , 67  yrs. old GENDER: Female ENDOSCOPIST: Beverley Fiedler, MD REFERRED BY:  Kerin Perna, MD PROCEDURE DATE:  12/26/2012 PROCEDURE:  EGD w/ control of bleeding ASA CLASS:     Class IV INDICATIONS:  Hematemesis. MEDICATIONS: TOPICAL ANESTHETIC: none  DESCRIPTION OF PROCEDURE: After the risks benefits and alternatives of the procedure were thoroughly explained, informed consent was obtained.  The Pentax Gastroscope [id number] endoscope was introduced through the mouth and advanced to the second portion of the duodenum. Without limitations.  The instrument was slowly withdrawn as the mucosa was fully examined.        ESOPHAGUS: The mucosa of the esophagus appeared normal.  STOMACH: A mucosal tear was seen in the gastric cardia just distal to the GE junction.  This is possibly due to retching.The tear was visible in both forward view and retroflexed view.  There was a large clot in the gastric body obscuring visualization of the gastric mucosa in this area.  Attempts were made at irrigation and lavage, but the clot was too large to be aspirated through the scope.  There was blood in the gastric antrum, but this rinsed away and no mucosal lesions were identified.  The mucosal tear in the gastric cardia was injected with 2 cc of epinephrine, 1:10,000, and then to hemostatic clips were successfully placed across this lesion.  There was no active bleeding at the end of the case.  DUODENUM: The duodenal mucosa showed no abnormalities in the bulb and second portion of the duodenum.  Retroflexed views revealed a gastric Mallory-Weiss tear as described above.  The scope was then withdrawn from the patient and the procedure completed.  COMPLICATIONS: There were no complications.  ENDOSCOPIC  IMPRESSION: 1.   The mucosa of the esophagus appeared normal 2.   Mucosal tear in the gastric cardia, likely source of recent upper GI bleeding. Status post epinephrine injection and 2 hemostatic clips 3.    Clotted blood in the gastric body obscuring complete visualization 4.   The duodenal mucosa showed no abnormalities in the bulb and second portion of the duodenum  RECOMMENDATIONS: 1.  PPI infusion for 72 hours 2.  Very careful placement of nasojejunal feeding tube under fluoroscopy is likely okay.  Careful attention when passing the tip of the feeding tube past the GE junction and proximal stomach 3.  Monitor hemoglobin and hematocrit for ongoing bleeding, transfuse if necessary   eSigned:  Beverley Fiedler, MD 12/26/2012 1:23 PM     PATIENT NAME:  Kaitlin [name], Kaitlin [name] MR#: [id number]

## 2012-12-26 NOTE — Progress Notes (Deleted)
PARENTERAL NUTRITION CONSULT NOTE - Follow Up  Pharmacy Consult for TPN Indication: TF intolerance with emesis  No Known Allergies  Patient Measurements: Height: 5\' 9"  (175.3 cm) Weight: 197 lb 8.5 oz (89.6 kg) IBW/kg (Calculated) : 66.2 (22% above IBW)  Other Metrics Adjusted weight 72.1 kg  Nutritional weight 69.9 kg  Lean body weight 49.3 kg  BMI 26.3 kg/m2  BSA 1.98 m2   Vital Signs: Temp: 100 F (37.8 C) (08/26 0900) Temp src: Core (Comment) (08/26 0900) Pulse Rate: 71 (08/26 0900) Intake/Output from previous day: 08/25 0701 - 08/26 0700 In: 9438.3 [I.V.:3162.3; Blood:2406; NG/GT:1910; IV Piggyback:1900; TPN:60] Out: 6870 [Urine:2260; Emesis/NG output:2300; Drains:800; Blood:1100; Chest Tube:410] Intake/Output from this shift: Total I/O In: 444.1 [I.V.:354.1; NG/GT:90] Out: 210 [Urine:130; Drains:50; Chest Tube:30]  Labs:  Recent Labs  12/24/12 1507 12/25/12 0043  12/25/12 1331 12/25/12 2255 12/26/12 0355  WBC  --   --   < > 30.3* 21.8* 23.1*  HGB 9.0*  --   < > 7.5* 9.4* 9.0*  HCT 26.2*  --   < > 21.4* 26.6* 25.2*  PLT  --   --   < > 89* 227 262  APTT 79* 100*  --  43*  --   --   INR  --   --   --   --   --  1.25  < > = values in this interval not displayed.   Recent Labs  12/24/12 0500  12/25/12 0339  12/25/12 1110 12/25/12 1331 12/26/12 0355  NA 130*  < > 131*  < > 133* 136 130*  K 4.1  < > 3.8  < > 3.8 3.3* 5.2*  CL 94*  < > 97  --   --  104 98  CO2 27  < > 31  --   --  23 23  GLUCOSE 301*  < > 142*  < > 152* 160* 142*  BUN 13  < > 17  --   --  18 24*  CREATININE <0.20*  < > 0.72  --   --  0.70 0.84  CALCIUM 8.1*  < > 8.1*  --   --  6.9* 8.5  MG 1.8  --  2.1  --   --   --  1.8  PHOS 1.4*  --  1.9*  --   --   --  2.9  PROT 4.3*  --  4.3*  --   --   --  4.8*  ALBUMIN 1.9*  --  1.9*  --   --   --  2.8*  AST 28  --  34  --   --   --  26  ALT 15  --  18  --   --   --  11  ALKPHOS 80  --  78  --   --   --  78  BILITOT 1.2  --  1.1  --   --    --  2.0*  PREALBUMIN 6.2*  --  5.9*  --   --   --   --   TRIG 119  --  159*  --   --   --   --   < > = values in this interval not displayed. Estimated Creatinine Clearance: 79.7 ml/min (by C-G formula based on Cr of 0.84).    Recent Labs  12/25/12 2349 12/26/12 0347 12/26/12 0735  GLUCAP 111* 139* 139*    Medical History: Past Medical History  Diagnosis  Date  . Paroxysmal supraventricular tachycardia   . Hypokalemia   . Chronic systolic heart failure     a. 05/6107 Echo: EF 15%, mod to sev antlat and inflat HK, inf AK, mild to mod MR, mildly/mod reduced RV fxn, Mod TR, PASP .  [name] History of DVT (deep vein thrombosis)   . Dyslipidemia   . HTN (hypertension)   . Nonischemic cardiomyopathy     a. 06/2012 Echo: EF 15%;  b. 06/2012 Cath: nl except luminal irregs in LAD.  [name] Ventricular tachycardia     a. s/p ICD  . Goiter 2001  . Implantable cardiac defibrillator- Biotronik     Single chamber  . [name] (left bundle branch block)     intermittent  . Palliative care patient    Insulin Requirements in the past 24 hours:  She has no history of diabetes but received 6 units of Novolog in the last 24 hours for CBG's of 111-139  Current Nutrition:  TPN at 50 ml/hr and lipids at 10 ml/hr 1351 kCal and 60 gm protein.  Assessment: This is a 67 yo female with cardiac disease who recently underwent LVAD treatment.  She has severe heart failure and is undergoing gentle duresis.  She had been receiving tube feeds for nutritional support but has been unable to tolerate with 200 cc of emesis.  TF's have been on hold and the plan is to use TPN short term to prevent nutritional deficit issues. Plan is to insert  postpyloric feeding tube when UGI bleeding better.  She remains volume overloaded so will not advance TPN rate.  Nutritional Goals:  Her estimated needs are: (IBW 66kg) - 1626 kcal (TBW 80.9kg) - 112-131 GI: She is slightly overweight with BMI of 26.3.  UGI bleeding Lytes:  Phos  bolus not charted as given but phos increased to 2.9.  Mag 1.8, K 5.2, Na 130 Renal:  SrCr 0.84  UOP 1.1 ml/kg/hr.  Positive 2.5 L yesterday with overall weight gain of ~ 30 lb per MD Pulm:   Currently intubated with some sedation but responsive to questions Cards:  Post-op day #7 of LVAD with BiVad support.  8/25  OR for removal RVAD Hepatobil:  T.Bili 2.0, low albumin and total protein.  TG 159, LDH 375  Prealb 5.9 Neuro:   No neurological deficits noted ID:   Tmax 100.2 (Vanc/Ceftazidime) - culture data non-revealing, still with some leukocytosis  Best Practices:  IV PPI, coumadin per MD TPN Access: CVC Double Lumen Right Internal jugular - 12/18/12 >>  TPN day # 3  Plan:  1.  Will  Continue Clinimix 5/15 E at 50 ml/hr 2.  Cont  lipids at 58ml/hr 3.  Continue current SSI coverage 4.  F/U labs and adjust as needed.  Talbert Cage, PharmD Clinical Pharmacist Pager:  [phone] Thank you for allowing pharmacy to be part of this patients care team. 12/26/2012,9:13 AM

## 2012-12-26 NOTE — Op Note (Signed)
NAMELAVERNA, Kaitlin [name] NO.:  [id number]  MEDICAL RECORD NO.:  [id number]  LOCATION:  2S10C                        FACILITY:  [facility]  PHYSICIAN:  Kerin Perna, M.D.  DATE OF BIRTH:  1946/03/16  DATE OF PROCEDURE:  12/08/2012 DATE OF DISCHARGE:                              OPERATIVE REPORT   OPERATION: 1. Removal of right ventricular assist device. 2. Placement of pulmonary artery Swan catheter.  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  SURGEON:  Kerin Perna, M.D.  ASSISTANT:  Teryl Lucy, RNFA  ANESTHESIA:  Guadalupe Maple, M.D.  CLINICAL NOTE:  The patient is 59 and underwent a HeartMate II implantable LVAD 1 week ago for class IV CHF with failed medical therapy.  She had return to the OR within few hours of surgery for placement of an RVAD using the CentriMag pump and cannulation of the pulmonary artery and right atrium.  After 1 week of support, the RV native function appeared to show recovery and the patient was brought back to the operating room for removal of the RVAD.  I discussed the procedure in detail with the patient's family including the benefits, risks, and alternatives if we could not remove the RVAD.  They understood and agreed to proceed with surgery.  OPERATIVE FINDINGS:  Improved global RV function from previous exam a week ago, allowing successful removal of RVAD.  DESCRIPTION OF PROCEDURE:  After procedure, the patient was brought directly from the ICU to the operating room and placed  supine on the operating room table.  General anesthesia was induced and a transesophageal echo probe was placed by the anesthesiologist. Previously placed chest tubes and chest dressing were removed and the patient's chest, abdomen, and groins were prepped and draped as a sterile field.  A proper time-out was performed.  The skin was opened, which was used to cover the mediastinal wound.  There was a mild-to- moderate amount of clotted blood in the anterior mediastinum and this was all removed.  The retractor was placed.  The mediastinum was then irrigated with copious amounts of sterile saline as well as vancomycin irrigation.  There were some clots extending around the pump down in the pump pocket and there were all removed as well.  Bilateral pleural effusions were drained.  The patient was then dosed with heparin according to the baseline ACT and the target ACT of 300 seconds.  Low-dose epinephrine and norepinephrine were started and nitric oxide was increased via the ventilator circuit.  We then went through a process of slowly reducing the flow rates on the RVAD and watching how the CVP, PA pressure, filling of the HeartMate II VAD (pulsatile index) and systemic pressure changed.  We slowly went from 4 liters on RVAD flow to 3 liters to 2 liters to 1.5 liters.  At 1 liter/minute, the RV function appeared to be adequate with adequate CVP pulmonary artery pressure, systemic  pressure and pulsatility index of the HeartMate II LVAD.  The cannulas were then clamped and we continued to watch and observe the patient and she remained hemodynamically stable.  The cannulas were then removed after extensive period of time of observation and then we proceed with the reversing the heparin with protamine.  The mediastinum was then fully irrigated.  The pursestring was placed in the right atrium and the distal RV outflow tract were hemostatic.  A new set of pleural and mediastinal chest tubes  were placed.  The cannula sites were debrided and closed with subcu Vicryl and then nylon skin sutures.  It was decided not to close the sternum due to compression of the RV.  I attempted to reapproximate the sternal edges at one point.  This immediately resulted in a drop in filling of the LVAD with a significant drop in the pulsatility index, so the sternum was then allowed to fall back to a neutral position.  We used a wound VAC with a white sponge first and then a black sponge and then the sterile drapes on the back to close the chest.  The chest tubes were connected to underwater seal Pleur-Evac drainage systems.  At this point, it was decided to place a new Swan catheter and remove the old Ernestine Conrad, which had been placed for a week.  Initially, a new triple- lumen transvenous catheter was placed in the right subclavian vein.  The new Swan catheter was placed via the left IJ using prep and drape field in sterile technique and the old Ernestine Conrad was then removed.  The patient remained hemodynamically stable and then was transferred to the ICU bed and transported back to the ICU in critical, but stable condition.     Kerin Perna, M.D.     PV/MEDQ  D:  12/25/2012  T:  12/26/2012  Job:  [id number]  cc:   Bevelyn Buckles. Bensimhon, MD

## 2012-12-26 NOTE — Progress Notes (Signed)
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 EF 15-25% with mild to moderate RV dysfunction  Underwent HM II LVAD implant along with TV repair on 8/18.  She was taken back to the OR for RVAD support.   On 8/25 was taken back to OR for RVAD explant.  Remains intubated. Stable overnight. Remains on epi 8 and levophed 10. CVP 14-15 range. No further low flow alarms on VAD.  Co-ox 59%. Renal function stable. Weight up about 30 pounds from baseline  Chest remains open with wound vac. Had bleeding overnight from NG tube. Now seems to be slowing  LVAD INTERROGATION:  HeartMate II LVAD:  Flow 3.6  liters/min, speed 8400, power 3.8, PI 5.7.    Objective:    Vital Signs:   Temp:  [97.2 F (36.2 C)-100.9 F (38.3 C)] 100.2 F (37.9 C) (08/26 0800) Pulse Rate:  [70-99] 72 (08/26 0800) Resp:  [0-30] 17 (08/26 0800) BP: (84)/(77) 84/77 mmHg (08/25 1535) SpO2:  [87 %-100 %] 87 % (08/26 0800) FiO2 (%):  [50 %-60 %] 50 % (08/26 0800) Weight:  [89.6 kg (197 lb 8.5 oz)] 89.6 kg (197 lb 8.5 oz) (08/26 0500) Last BM Date: 12/15/12 Mean arterial Pressure 60-70s  Intake/Output:   Intake/Output Summary (Last 24 hours) at 12/26/12 0818 Last data filed at 12/26/12 0800  Gross per 24 hour  Intake 7908.31 ml  Output   5350 ml  Net 2558.31 ml     Physical Exam: General: Intubated and sedated,  HEENT: normal Neck: supple. CVP 15. Carotids 2+ bilat; no bruits. No lymphadenopathy or thryomegaly appreciated.  Cor: Mechanical heart sounds with LVAD hum present.; Chest open with wound vac Lungs: CTA anteriorly Abdomen: soft, nontender, mildly distended. Hypoactive BS Driveline: C/D/I; securement device intact  Extremities: no cyanosis, clubbing, rash, 2+ edema Neuro: alert & orientedx3, cranial nerves grossly intact. moves all 4 extremities w/o difficulty. Affect pleasant  Telemetry: AV paced  80s  Labs: Basic Metabolic Panel:  Recent Labs Lab 12/22/12 0432  12/23/12 0402  12/24/12 0500 12/24/12 1507 12/25/12 0339 12/25/12 0840 12/25/12 0958 12/25/12 1110 12/25/12 1331 12/26/12 0355  NA 133*  < > 133*  < > 130* 131* 131* 132* 132* 133* 136 130*  K 3.4*  < > 3.7  < > 4.1 3.8 3.8 4.1 3.9 3.8 3.3* 5.2*  CL 97  < > 98  < > 94* 96 97  --   --   --  104 98  CO2 28  < > 29  < > 27 29 31   --   --   --  23 23  GLUCOSE 115*  < > 126*  < > 301* 155* 142* 169* 142* 152* 160* 142*  BUN 18  < > 22  < > 13 14 17   --   --   --  18 24*  CREATININE 0.93  < > 0.76  < > <0.20* 0.62 0.72  --   --   --  0.70 0.84  CALCIUM 8.3*  < > 8.2*  < > 8.1* 8.3* 8.1*  --   --   --  6.9* 8.5  MG 1.9  --  2.3  --  1.8  --  2.1  --   --   --   --  1.8  PHOS 2.5  --  2.1*  --  1.4*  --  1.9*  --   --   --   --  2.9  < > = values in this interval not displayed.  Liver Function Tests:  Recent Labs Lab 12/22/12 0432 12/23/12 0402 12/24/12 0500 12/25/12 0339 12/26/12 0355  AST 39* 31 28 34 26  ALT 18 16 15 18 11   ALKPHOS 85 94 80 78 78  BILITOT 4.5* 1.8* 1.2 1.1 2.0*  PROT 4.9* 4.8* 4.3* 4.3* 4.8*  ALBUMIN 2.4* 2.3* 1.9* 1.9* 2.8*   No results found for this basename: LIPASE, AMYLASE,  in the last 168 hours No results found for this basename: AMMONIA,  in the last 168 hours  CBC:  Recent Labs Lab 12/20/12 0400  12/21/12 0420  12/24/12 0500  12/25/12 0339  12/25/12 0958 12/25/12 1110 12/25/12 1331 12/25/12 2255 12/26/12 0355  WBC 11.1*  < > 13.4*  < > 17.2*  --  19.3*  --   --   --  30.3* 21.8* 23.1*  NEUTROABS 9.0*  --  11.5*  --  12.9*  --  15.1*  --   --   --   --   --   --   HGB 8.8*  < > 9.3*  < > 7.5*  < > 7.3*  < > 8.8* 10.5* 7.5* 9.4* 9.0*  HCT 25.0*  < > 26.7*  < > 22.5*  < > 21.3*  < > 26.0* 31.0* 21.4* 26.6* 25.2*  MCV 82.2  < > 84.5  < > 87.5  --  87.3  --   --   --  84.9 84.2 84.3  PLT 104*  < > 94*  < > 158  --  178  --   --   --  89* 227 262  < > = values in this  interval not displayed.  INR:  Recent Labs Lab 12/19/12 1545 12/20/12 0400 12/21/12 0420 12/22/12 0432 12/26/12 0355  INR 1.30 1.37 1.10 1.13 1.25    Imaging: Dg Chest Port 1 View  12/25/2012   *RADIOLOGY REPORT*  Clinical Data: Status post removal of right ventricular assist device.  Addition of Swan-Ganz catheter.  PORTABLE CHEST - 1 VIEW  Comparison: Chest x-ray 12/25/2012.  Findings: Previously noted cannula from right ventricular assist device has been removed.  Left ventricular assist device remains in position with a inflow cannula oriented in line with the expected left ventricular long axis.  Bilateral chest tubes are in position. There has been interval placement of a Swan-Ganz catheter with tip in the proximal right pulmonary artery, the left internal jugular approach.  Previously noted right IJ Cordis and right IJ central venous catheter remain in position, and there has also been placement of a right subclavian central venous catheter.  The tips of these catheters are difficult to localize secondary to overlapping structures, but they appear to be centrally positioned within the vena cava. An endotracheal tube is in place with tip 2.4 cm above the carina. Left-sided pacemaker / AICD with lead tip terminating in the right ventricular apex.  Status post tricuspid annuloplasty. A nasogastric tube is seen extending into the stomach, however, the tip of the nasogastric tube extends below the lower margin of the image.  Mediastinal drain is also noted.  The lung volumes are low.  No definite pneumothorax.  No definite acute consolidative airspace disease.  Patchy linear opacities throughout the mid lungs bilaterally and in the left base are most compatible with subsegmental atelectasis.  Small left pleural effusion.  No evidence  of pulmonary edema.  Heart size remains mildly enlarged. The patient is rotated to the left on today's exam, resulting in distortion of the mediastinal contours and  reduced diagnostic sensitivity and specificity for mediastinal pathology.  Atherosclerosis in the thoracic aorta.  IMPRESSION: 1.  Postoperative changes and support apparatus, as above. 2.  Low lung volumes with improving aeration throughout the lungs bilaterally, likely represent resolving postoperative atelectasis and resolution of some postoperative edema.   Original Report Authenticated By: Trudie Reed, M.D.   Dg Chest Port 1 View  12/25/2012   *RADIOLOGY REPORT*  Clinical Data: Ventricular assist device  PORTABLE CHEST - 1 VIEW  Comparison: Prior chest x-ray 12/24/2012  Findings: The patient remains intubated.  The tip of the endotracheal tube is 2.9 cm above the carina.  A right IJ vascular sheath conducts a Swan-Ganz catheter into the heart.  The tip of the Swan-Ganz catheter overlies the distal right main pulmonary artery.  The left subclavian approach cardiac rhythm maintenance device is in unchanged position.  Grossly stable positioning of the incompletely imaged ventricular assist device.  Bilateral thoracostomy tubes remain in place.  Right IJ central venous catheter is present with the tip at the superior cavoatrial junction.  Significantly decreased aeration with new peripheral left upper lobe opacity and increased bibasilar opacities. Background of pulmonary edema.  IMPRESSION:  1.  Stable and satisfactory support apparatus. 2.  Significantly decreased aeration with new peripheral left upper lung opacity, lower lung volumes and bilateral pleural effusions with associated bibasilar atelectasis versus infiltrate. 3.  Mild pulmonary edema.   Original Report Authenticated By: Malachy Moan, M.D.     Medications:     Scheduled Medications: . antiseptic oral rinse  15 mL Mouth Rinse QID  . aspirin  324 mg Per Tube Daily  . aspirin  300 mg Rectal Daily  . bisacodyl  10 mg Oral Daily   Or  . bisacodyl  10 mg Rectal Daily  . cefTAZidime (FORTAZ)  IV  1 g Intravenous Q8H  .  chlorhexidine  15 mL Mouth Rinse BID  . docusate  200 mg Per Tube Daily  . insulin aspart  0-24 Units Subcutaneous Q4H  . levalbuterol  1.25 mg Nebulization Q6H  . magic mouthwash  5 mL Oral QID  . mupirocin ointment  1 application Topical BID  . pantoprazole (PROTONIX) IV  40 mg Intravenous Q12H  . small volume/piggyback builder   Intravenous Once  . sodium chloride  10 mL Intravenous Q12H  . vancomycin  750 mg Intravenous Q8H  . warfarin IV  2.5 mg Intravenous Once    Infusions: . sodium chloride Stopped (12/24/12 1240)  . sodium chloride 20 mL/hr at 12/25/12 2021  . sodium chloride 250 mL (12/24/12 1200)  . sodium chloride 250 mL (12/25/12 0220)  . amiodarone (NEXTERONE PREMIX) 360 mg/200 mL dextrose 30 mg/hr (12/26/12 0800)  . dexmedetomidine 0.7 mcg/kg/hr (12/26/12 0800)  . DOBUTamine Stopped (12/23/12 1100)  . DOPamine 3 mcg/kg/min (12/26/12 0800)  . epinephrine 8 mcg/min (12/26/12 0800)  . fentaNYL infusion INTRAVENOUS 125 mcg/hr (12/26/12 0800)  . furosemide (LASIX) infusion    . heparin    . lactated ringers 10 mL/hr at 12/24/12 1300  . midazolam (VERSED) infusion 4 mg/hr (12/26/12 0800)  . nitroGLYCERIN Stopped (12/18/12 1705)  . norepinephrine (LEVOPHED) Adult infusion 10 mcg/min (12/26/12 0800)  . phenylephrine (NEO-SYNEPHRINE) Adult infusion Stopped (12/24/12 0000)    PRN Medications: acetaminophen (TYLENOL) oral liquid 160 mg/5 mL, diphenhydrAMINE, fentaNYL, midazolam, morphine injection,  ondansetron (ZOFRAN) IV, ondansetron (ZOFRAN) IV, sodium chloride, sodium chloride   Assessment:   1. Acute on chronic systolic HF with biventricular HF EF 10%  --S/p HM II VAD with TV repair 12/18/12  --s/p RVAD. - explanted 8/25 2. RV failure  3. Cardiogenic shock  4. VT s/p Biotronik ICD on amio 5. Chronic renal failure  6. 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 ABL anemia  8. PNA 9. Severe protein calorie malnutrition - on TPN  Plan/Discussion:    RV seems to be stable off  Centrimag. Goal will be to try and wean epi down and also diurese as tolerated. Continue abx for probable PNA. Transfuse as needed. Will need to start anticoagulation back soon.   The patient is critically ill with multiple organ systems failure and requires high complexity decision making for assessment and support, frequent evaluation and titration of therapies, application of advanced monitoring technologies and extensive interpretation of multiple databases.   Critical Care Time devoted to patient care services described in this note is 35 Minutes.  [name] 8:18 AM   Length of Stay: 18 VAD Team Pager [phone] (7am - 7am) VAD Issue  Non-VAD issues HF Pager [phone]

## 2012-12-26 NOTE — Consult Note (Addendum)
WOC follow-up: Vac intact to chest post-op wound. Intact with good seal at cont suction, mod amt bright red blood in cannister. Bedside nurse states Dr Morton Peters plans to change sternal vac tomorrow in the OR. Please re-consult if further assistance is needed.  Thank-you,  Cammie Mcgee MSN, RN, CWOCN, [place], CNS [phone]

## 2012-12-26 NOTE — Progress Notes (Signed)
CRITICAL VALUE ALERT  Critical value received: vanc. trough  Date of notification:  12/26/12  Time of notification:  0900  Critical value read back yes  Nurse who received alert:  s.Lawanda Holzheimer  MD notified (1st page):  Pharmacy notified  Time of first page:  0915  Responding MD:  pharmacy  Time MD responded: [phone]

## 2012-12-26 NOTE — Progress Notes (Addendum)
ANTIBIOTIC CONSULT NOTE - FOLLOW UP  Pharmacy Consult for Vancomycin Indication: rule out pneumonia  No Known Allergies  Patient Measurements: Height: 5\' 9"  (175.3 cm) Weight: 197 lb 8.5 oz (89.6 kg) IBW/kg (Calculated) : 66.2  Vital Signs: Temp: 100.8 F (38.2 C) (08/26 1513) Temp src: Core (Comment) (08/26 1500) BP: 85/74 mmHg (08/26 1513) Pulse Rate: 72 (08/26 1513) Intake/Output from previous day: 08/25 0701 - 08/26 0700 In: 9438.3 [I.V.:3162.3; Blood:2406; NG/GT:1910; IV Piggyback:1900; TPN:60] Out: 6870 [Urine:2260; Emesis/NG output:2300; Drains:800; Blood:1100; Chest Tube:410] Intake/Output from this shift: Total I/O In: 1763.7 [I.V.:1493.7; NG/GT:220; IV Piggyback:50] Out: 1610 [Urine:830; Emesis/NG output:400; Drains:250; Chest Tube:130]  Labs:  Recent Labs  12/25/12 0339  12/25/12 1331 12/25/12 2255 12/26/12 0355  WBC 19.3*  --  30.3* 21.8* 23.1*  HGB 7.3*  < > 7.5* 9.4* 9.0*  PLT 178  --  89* 227 262  CREATININE 0.72  --  0.70  --  0.84  < > = values in this interval not displayed. Estimated Creatinine Clearance: 79.7 ml/min (by C-G formula based on Cr of 0.84).  Recent Labs  12/26/12 0830 12/26/12 1351  VANCOTROUGH 36.5* 22.1*     Microbiology: Recent Results (from the past 720 hour(s))  MRSA PCR SCREENING     Status: None   Collection Time    12/08/12  3:37 PM      Result Value Range Status   MRSA by PCR NEGATIVE  NEGATIVE Final   Comment:            The GeneXpert MRSA Assay (FDA     approved for NASAL specimens     only), is one component of a     comprehensive MRSA colonization     surveillance program. It is not     intended to diagnose MRSA     infection nor to guide or     monitor treatment for     MRSA infections.  SURGICAL PCR SCREEN     Status: Abnormal   Collection Time    12/13/12  6:10 PM      Result Value Range Status   MRSA, PCR NEGATIVE  NEGATIVE Final   Staphylococcus aureus POSITIVE (*) NEGATIVE Final   Comment:             The Xpert SA Assay (FDA     approved for NASAL specimens     in patients over 31 years of age),     is one component of     a comprehensive surveillance     program.  Test performance has     been validated by [employer] for patients greater     than or equal to 31 year old.     It is not intended     to diagnose infection nor to     guide or monitor treatment.  CULTURE, RESPIRATORY (NON-EXPECTORATED)     Status: None   Collection Time    12/19/12  9:50 AM      Result Value Range Status   Specimen Description TRACHEAL ASPIRATE   Final   Special Requests Normal   Final   Gram Stain     Final   Value: ABUNDANT WBC PRESENT, PREDOMINANTLY PMN     NO SQUAMOUS EPITHELIAL CELLS SEEN     RARE GRAM POSITIVE COCCI     IN PAIRS     Performed at [facility]   Culture     Final   Value:  NO GROWTH 2 DAYS     Performed at [facility]   Report Status 12/21/2012 FINAL   Final  URINE CULTURE     Status: None   Collection Time    12/22/12  9:30 AM      Result Value Range Status   Specimen Description URINE, CATHETERIZED   Final   Special Requests NONE   Final   Culture  Setup Time     Final   Value: 12/22/2012 13:49     Performed at [facility]   Colony Count     Final   Value: NO GROWTH     Performed at [facility]   Culture     Final   Value: NO GROWTH     Performed at [facility]   Report Status 12/23/2012 FINAL   Final  CULTURE, RESPIRATORY (NON-EXPECTORATED)     Status: None   Collection Time    12/23/12  8:15 AM      Result Value Range Status   Specimen Description TRACHEAL ASPIRATE   Final   Special Requests NONE   Final   Gram Stain     Final   Value: FEW WBC PRESENT, PREDOMINANTLY PMN     NO SQUAMOUS EPITHELIAL CELLS SEEN     NO ORGANISMS SEEN     Performed at [facility]   Culture     Final   Value: NO GROWTH 2 DAYS     Performed at [facility]   Report Status 12/25/2012 FINAL   Final     Anti-infectives   Start     Dose/Rate Route Frequency Ordered Stop   12/26/12 1800  vancomycin (VANCOCIN) IVPB 1000 mg/200 mL premix     1,000 mg 200 mL/hr over 60 Minutes Intravenous Every 12 hours 12/26/12 1553     12/25/12 1015  vancomycin (VANCOCIN) powder 1,000 mg  Status:  Discontinued     1,000 mg Other To Surgery 12/25/12 1003 12/25/12 1258   12/25/12 1007  vancomycin (VANCOCIN) powder  Status:  Discontinued       As needed 12/25/12 1008 12/25/12 1249   12/25/12 0944  vancomycin (VANCOCIN) 1,000 mg in sodium chloride 0.9 % 1,000 mL irrigation  Status:  Discontinued       As needed 12/25/12 0944 12/25/12 1249   12/25/12 0800  vancomycin (VANCOCIN) 1,000 mg in sodium chloride 0.9 % 1,000 mL irrigation  Status:  Discontinued      Irrigation To Surgery 12/25/12 0759 12/25/12 1258   12/25/12 0400  vancomycin (VANCOCIN) 1,250 mg in sodium chloride 0.9 % 250 mL IVPB  Status:  Discontinued     1,250 mg 166.7 mL/hr over 90 Minutes Intravenous To Surgery 12/24/12 1926 12/25/12 1258   12/25/12 0400  cefUROXime (ZINACEF) 1.5 g in dextrose 5 % 50 mL IVPB     1.5 g 100 mL/hr over 30 Minutes Intravenous To Surgery 12/24/12 1926 12/25/12 0923   12/25/12 0400  cefUROXime (ZINACEF) 750 mg in dextrose 5 % 50 mL IVPB  Status:  Discontinued     750 mg 100 mL/hr over 30 Minutes Intravenous To Surgery 12/24/12 1828 12/25/12 1258   12/23/12 2230  vancomycin (VANCOCIN) IVPB 750 mg/150 ml premix  Status:  Discontinued     750 mg 150 mL/hr over 60 Minutes Intravenous 3 times per day 12/23/12 2223 12/26/12 1553   12/22/12 1000  vancomycin (VANCOCIN) IVPB 750 mg/150 ml premix  Status:  Discontinued  750 mg 150 mL/hr over 60 Minutes Intravenous Every 12 hours 12/22/12 0854 12/23/12 2223   12/21/12 0600  cefTAZidime (FORTAZ) injection 1 g  Status:  Discontinued     1 g Intramuscular 3 times per day 12/20/12 1848 12/20/12 2002   12/20/12 2200  cefTAZidime (FORTAZ) 1 g in dextrose 5 % 50 mL IVPB     1  g 100 mL/hr over 30 Minutes Intravenous 3 times per day 12/20/12 1957     12/20/12 1800  vancomycin (VANCOCIN) IVPB 1000 mg/200 mL premix  Status:  Discontinued    Comments:  Continue until chest is closed   1,000 mg 200 mL/hr over 60 Minutes Intravenous Every 24 hours 12/20/12 0745 12/22/12 0854   12/20/12 1600  cefUROXime (ZINACEF) 1.5 g in dextrose 5 % 50 mL IVPB  Status:  Discontinued     1.5 g 100 mL/hr over 30 Minutes Intravenous Every 12 hours 12/20/12 0745 12/20/12 1848   12/19/12 1800  vancomycin (VANCOCIN) IVPB 1000 mg/200 mL premix  Status:  Discontinued    Comments:  Continue until chest is closed   1,000 mg 200 mL/hr over 60 Minutes Intravenous Every 24 hours 12/18/12 2344 12/20/12 0745   12/19/12 1615  cefUROXime (ZINACEF) 1.5 g in dextrose 5 % 50 mL IVPB  Status:  Discontinued     1.5 g 100 mL/hr over 30 Minutes Intravenous Every 12 hours 12/19/12 1601 12/20/12 0745   12/19/12 0800  rifampin (RIFADIN) capsule 600 mg     600 mg Oral  Once 12/18/12 1544 12/19/12 1613   12/19/12 0600  fluconazole (DIFLUCAN) IVPB 400 mg     400 mg 100 mL/hr over 120 Minutes Intravenous  Once 12/18/12 1544 12/19/12 0816   12/19/12 0400  vancomycin (VANCOCIN) 1,250 mg in sodium chloride 0.9 % 250 mL IVPB  Status:  Discontinued     1,250 mg 166.7 mL/hr over 90 Minutes Intravenous To Surgery 12/18/12 2347 12/18/12 2356   12/19/12 0400  cefUROXime (ZINACEF) 1.5 g in dextrose 5 % 50 mL IVPB  Status:  Discontinued     1.5 g 100 mL/hr over 30 Minutes Intravenous To Surgery 12/18/12 2347 12/18/12 2356   12/19/12 0400  fluconazole (DIFLUCAN) IVPB 400 mg  Status:  Discontinued     400 mg 100 mL/hr over 120 Minutes Intravenous To Surgery 12/18/12 2347 12/18/12 2356   12/18/12 2300  cefUROXime (ZINACEF) 1.5 g in dextrose 5 % 50 mL IVPB  Status:  Discontinued     1.5 g 100 mL/hr over 30 Minutes Intravenous Every 12 hours 12/18/12 1544 12/18/12 1838   12/18/12 2200  vancomycin (VANCOCIN) IVPB 1000 mg/200  mL premix  Status:  Discontinued     1,000 mg 200 mL/hr over 60 Minutes Intravenous Every 24 hours 12/18/12 1616 12/18/12 1838   12/18/12 2145  vancomycin (VANCOCIN) IVPB 1000 mg/200 mL premix  Status:  Discontinued     1,000 mg 200 mL/hr over 60 Minutes Intravenous Every 12 hours 12/18/12 1544 12/18/12 1616   12/18/12 1845  cefUROXime (ZINACEF) 750 mg in dextrose 5 % 50 mL IVPB     750 mg 100 mL/hr over 30 Minutes Intravenous To Surgery 12/18/12 1828 12/19/12 1845   12/18/12 1845  vancomycin (VANCOCIN) powder 1,000 mg  Status:  Discontinued     1,000 mg Other To Surgery 12/18/12 1828 12/18/12 1838   12/18/12 1830  vancomycin (VANCOCIN) IVPB 1000 mg/200 mL premix     1,000 mg 200 mL/hr over 60 Minutes Intravenous  Once 12/18/12 1822 12/18/12 1930   12/18/12 1134  vancomycin (VANCOCIN) powder  Status:  Discontinued       As needed 12/18/12 1135 12/18/12 1411   12/18/12 0400  rifampin (RIFADIN) capsule 600 mg     600 mg Oral  Once 12/17/12 1924 12/18/12 0429   12/18/12 0400  vancomycin (VANCOCIN) 1,250 mg in sodium chloride 0.9 % 250 mL IVPB     1,250 mg 166.7 mL/hr over 90 Minutes Intravenous To Surgery 12/17/12 1048 12/18/12 0810   12/18/12 0400  cefUROXime (ZINACEF) 1.5 g in dextrose 5 % 50 mL IVPB     1.5 g 100 mL/hr over 30 Minutes Intravenous To Surgery 12/17/12 1048 12/18/12 1321   12/18/12 0400  cefUROXime (ZINACEF) 750 mg in dextrose 5 % 50 mL IVPB  Status:  Discontinued     750 mg 100 mL/hr over 30 Minutes Intravenous To Surgery 12/17/12 1047 12/18/12 1411   12/18/12 0400  fluconazole (DIFLUCAN) IVPB 400 mg     400 mg 100 mL/hr over 120 Minutes Intravenous To Surgery 12/17/12 1048 12/18/12 0825   12/18/12 0400  vancomycin (VANCOCIN) powder 1,000 mg  Status:  Discontinued     1,000 mg Other To Surgery 12/17/12 1047 12/18/12 1411      Assessment: 67 y.o. F on Vancomycin + Ceftazidime for r/o PNA with a supratherapeutic Vanc trough this afternoon (22.1 mcg/ml). SCr stable at  0.84, CrCl~70-80 ml/min. Will decrease daily dose tonight.   Goal of Therapy:  Vancomycin trough level 15-20 mcg/ml  Plan:  1. Vancomycin 1000 mg IV every 12 hours 2. Will obtain trough level at SS 3. Will continue to follow renal function, culture results, LOT, and antibiotic de-escalation plans   Margie Billet, PharmD Clinical Pharmacist - Resident Pager: [phone] Pharmacy: [phone] 12/26/2012 3:54 PM  Agree with dose reduction Continue vanc while sternum is open above the heart pump

## 2012-12-26 NOTE — Progress Notes (Signed)
. HeartMate 2 Rounding Note  Subjective:   Postop day #8 HeartMate 2 implantation with subsequent emergency centri- mag RVAD because of  RV dysfunction. Patient with stable hemodynamics on BiVAD support.Returned to OR 8-25 for successful removal of RVAD Responsive but comfortable with IV sedation.   RVAD removed in OR  but sternum left open with VAC Pt now on epi/NE/nitric oxide for RV. RV function today is very stable with CVP 12-14, CO-OX > 60%  Endoscopy for postop UGI bleeding--- Mallory Weiss tear- clipped by GI OG tube is out on Protonix drip, patient needs nutrition   . Continue antibiotics- chest is openlow-grade temperature and leukocytosis, sputum and urine and blood cultures pending- all negative so far. Will place feeding tube- postpyloric- tomorrow if UGI bleeding better, patient needs nutrition  LVAD INTERROGATION:  HeartMate II LVAD:  Flow 31 liters/min, speed 8400 power 3.2, PI 4.8.   Objective:    Vital Signs:   Temp:  [98.6 F (37 C)-100.9 F (38.3 C)] 100.4 F (38 C) (08/26 1715) Pulse Rate:  [25-76] 76 (08/26 1715) Resp:  [0-30] 14 (08/26 1715) BP: (78-85)/(70-74) 85/74 mmHg (08/26 1513) SpO2:  [56 %-100 %] 100 % (08/26 1700) FiO2 (%):  [50 %] 50 % (08/26 1700) Weight:  [197 lb 8.5 oz (89.6 kg)] 197 lb 8.5 oz (89.6 kg) (08/26 0500) Last BM Date: 12/15/12 Mean arterial Pressure 84  Intake/Output:   Intake/Output Summary (Last 24 hours) at 12/26/12 1725 Last data filed at 12/26/12 1700  Gross per 24 hour  Intake 4978.81 ml  Output   4025 ml  Net 953.81 ml     Physical Exam: General: Intubated in ICU on right ventricular assist device, sedated and comfortable HEENT: normal Neck: supple. JVP normal  No lymphadenopathy or thryomegaly appreciated. Cor: Mechanical heart sounds with LVAD hum present. Lungs: clear Abdomen: soft, nontender, nondistended. No hepatosplenomegaly. No bruits or masses. Good bowel sounds. Extremities: no cyanosis, clubbing,  rash, edema extremities warm Neuro: moves all 4 extremities but now is on sedation protocol while on ventilator  Telemetry: AV paced 90 per minute  Labs: Basic Metabolic Panel:  Recent Labs Lab 12/22/12 0432  12/23/12 0402  12/24/12 0500 12/24/12 1507 12/25/12 0339  12/25/12 0958 12/25/12 1110 12/25/12 1331 12/26/12 0355 12/26/12 1500  NA 133*  < > 133*  < > 130* 131* 131*  < > 132* 133* 136 130* 131*  K 3.4*  < > 3.7  < > 4.1 3.8 3.8  < > 3.9 3.8 3.3* 5.2* 4.3  CL 97  < > 98  < > 94* 96 97  --   --   --  104 98 101  CO2 28  < > 29  < > 27 29 31   --   --   --  23 23 23   GLUCOSE 115*  < > 126*  < > 301* 155* 142*  < > 142* 152* 160* 142* 139*  BUN 18  < > 22  < > 13 14 17   --   --   --  18 24* 22  CREATININE 0.93  < > 0.76  < > <0.20* 0.62 0.72  --   --   --  0.70 0.84 0.76  CALCIUM 8.3*  < > 8.2*  < > 8.1* 8.3* 8.1*  --   --   --  6.9* 8.5 7.8*  MG 1.9  --  2.3  --  1.8  --  2.1  --   --   --   --  1.8  --   PHOS 2.5  --  2.1*  --  1.4*  --  1.9*  --   --   --   --  2.9  --   < > = values in this interval not displayed.  Liver Function Tests:  Recent Labs Lab 12/22/12 0432 12/23/12 0402 12/24/12 0500 12/25/12 0339 12/26/12 0355  AST 39* 31 28 34 26  ALT 18 16 15 18 11   ALKPHOS 85 94 80 78 78  BILITOT 4.5* 1.8* 1.2 1.1 2.0*  PROT 4.9* 4.8* 4.3* 4.3* 4.8*  ALBUMIN 2.4* 2.3* 1.9* 1.9* 2.8*   No results found for this basename: LIPASE, AMYLASE,  in the last 168 hours No results found for this basename: AMMONIA,  in the last 168 hours  CBC:  Recent Labs Lab 12/20/12 0400  12/21/12 0420  12/24/12 0500  12/25/12 0339  12/25/12 1110 12/25/12 1331 12/25/12 2255 12/26/12 0355 12/26/12 1500  WBC 11.1*  < > 13.4*  < > 17.2*  --  19.3*  --   --  30.3* 21.8* 23.1* 23.9*  NEUTROABS 9.0*  --  11.5*  --  12.9*  --  15.1*  --   --   --   --   --   --   HGB 8.8*  < > 9.3*  < > 7.5*  < > 7.3*  < > 10.5* 7.5* 9.4* 9.0* 7.9*  HCT 25.0*  < > 26.7*  < > 22.5*  < > 21.3*  <  > 31.0* 21.4* 26.6* 25.2* 22.5*  MCV 82.2  < > 84.5  < > 87.5  --  87.3  --   --  84.9 84.2 84.3 85.2  PLT 104*  < > 94*  < > 158  --  178  --   --  89* 227 262 275  < > = values in this interval not displayed.  INR:  Recent Labs Lab 12/20/12 0400 12/21/12 0420 12/22/12 0432 12/26/12 0355  INR 1.37 1.10 1.13 1.25    Other results:  EKG:   Imaging: Dg Chest Port 1 View  12/26/2012   *RADIOLOGY REPORT*  Clinical Data: Removal of ventricular assist device.  PORTABLE CHEST - 1 VIEW  Comparison: 12/25/2012.  Findings: Endotracheal tube terminates approximately 2.3 cm above the carina.  Nasogastric tube is followed into the stomach.  Left subclavian AICD lead tip projects in the right ventricle.  Left IJ Swan-Ganz catheter tip projects over the right pulmonary artery. Bilateral chest tubes, mediastinal drain and left ventricular assist device are stable in position.  Right subclavian central line tip projects over the SVC/RA junction are high right atrium.  Heart size stable.  Lungs are low in volume with central pulmonary vascular congestion and increasing left basilar airspace disease. No definite pneumothorax.  Probable small bilateral pleural effusions.  IMPRESSION:  1.  Low lung volumes with increasing left lower lobe air space disease. 2.  Central pulmonary vascular congestion and small bilateral pleural effusions.   Original Report Authenticated By: Leanna Battles, M.D.   Dg Chest Port 1 View  12/25/2012   *RADIOLOGY REPORT*  Clinical Data: Status post removal of right ventricular assist device.  Addition of Swan-Ganz catheter.  PORTABLE CHEST - 1 VIEW  Comparison: Chest x-ray 12/25/2012.  Findings: Previously noted cannula from right ventricular assist device has been removed.  Left ventricular assist device remains in position with a inflow cannula oriented in line with the expected left ventricular long axis.  Bilateral chest  tubes are in position. There has been interval placement of a  Swan-Ganz catheter with tip in the proximal right pulmonary artery, the left internal jugular approach.  Previously noted right IJ Cordis and right IJ central venous catheter remain in position, and there has also been placement of a right subclavian central venous catheter.  The tips of these catheters are difficult to localize secondary to overlapping structures, but they appear to be centrally positioned within the vena cava. An endotracheal tube is in place with tip 2.4 cm above the carina. Left-sided pacemaker / AICD with lead tip terminating in the right ventricular apex.  Status post tricuspid annuloplasty. A nasogastric tube is seen extending into the stomach, however, the tip of the nasogastric tube extends below the lower margin of the image.  Mediastinal drain is also noted.  The lung volumes are low.  No definite pneumothorax.  No definite acute consolidative airspace disease.  Patchy linear opacities throughout the mid lungs bilaterally and in the left base are most compatible with subsegmental atelectasis.  Small left pleural effusion.  No evidence of pulmonary edema.  Heart size remains mildly enlarged. The patient is rotated to the left on today's exam, resulting in distortion of the mediastinal contours and reduced diagnostic sensitivity and specificity for mediastinal pathology.  Atherosclerosis in the thoracic aorta.  IMPRESSION: 1.  Postoperative changes and support apparatus, as above. 2.  Low lung volumes with improving aeration throughout the lungs bilaterally, likely represent resolving postoperative atelectasis and resolution of some postoperative edema.   Original Report Authenticated By: Trudie Reed, M.D.   Dg Chest Port 1 View  12/25/2012   *RADIOLOGY REPORT*  Clinical Data: Ventricular assist device  PORTABLE CHEST - 1 VIEW  Comparison: Prior chest x-ray 12/24/2012  Findings: The patient remains intubated.  The tip of the endotracheal tube is 2.9 cm above the carina.  A right IJ  vascular sheath conducts a Swan-Ganz catheter into the heart.  The tip of the Swan-Ganz catheter overlies the distal right main pulmonary artery.  The left subclavian approach cardiac rhythm maintenance device is in unchanged position.  Grossly stable positioning of the incompletely imaged ventricular assist device.  Bilateral thoracostomy tubes remain in place.  Right IJ central venous catheter is present with the tip at the superior cavoatrial junction.  Significantly decreased aeration with new peripheral left upper lobe opacity and increased bibasilar opacities. Background of pulmonary edema.  IMPRESSION:  1.  Stable and satisfactory support apparatus. 2.  Significantly decreased aeration with new peripheral left upper lung opacity, lower lung volumes and bilateral pleural effusions with associated bibasilar atelectasis versus infiltrate. 3.  Mild pulmonary edema.   Original Report Authenticated By: Malachy Moan, M.D.     Medications:     Scheduled Medications: . antiseptic oral rinse  15 mL Mouth Rinse QID  . aspirin  324 mg Per Tube Daily  . aspirin  300 mg Rectal Daily  . bisacodyl  10 mg Oral Daily   Or  . bisacodyl  10 mg Rectal Daily  . cefTAZidime (FORTAZ)  IV  1 g Intravenous Q8H  . chlorhexidine  15 mL Mouth Rinse BID  . docusate  200 mg Per Tube Daily  . insulin aspart  0-24 Units Subcutaneous Q4H  . levalbuterol  1.25 mg Nebulization Q6H  . magic mouthwash  5 mL Oral QID  . mupirocin ointment  1 application Topical BID  . sodium chloride  10 mL Intravenous Q12H  . vancomycin  1,000 mg Intravenous  Q12H  . Warfarin - Physician Dosing Inpatient   Does not apply q1800  . warfarin IV  2.5 mg Intravenous Once    Infusions: . sodium chloride Stopped (12/24/12 1240)  . sodium chloride 20 mL/hr at 12/25/12 2021  . sodium chloride 250 mL (12/24/12 1200)  . sodium chloride 250 mL (12/25/12 0220)  . sodium chloride    . amiodarone (NEXTERONE PREMIX) 360 mg/200 mL dextrose 30  mg/hr (12/26/12 1700)  . dexmedetomidine 0.6 mcg/kg/hr (12/26/12 1700)  . DOBUTamine Stopped (12/23/12 1100)  . DOPamine 3 mcg/kg/min (12/26/12 1700)  . epinephrine 8 mcg/min (12/26/12 1700)  . fentaNYL infusion INTRAVENOUS 125 mcg/hr (12/26/12 1700)  . furosemide (LASIX) infusion 4 mg/hr (12/26/12 1700)  . heparin 600 Units/hr (12/26/12 1704)  . lactated ringers 10 mL/hr at 12/24/12 1300  . midazolam (VERSED) infusion 4 mg/hr (12/26/12 1700)  . nitroGLYCERIN Stopped (12/18/12 1705)  . norepinephrine (LEVOPHED) Adult infusion 10 mcg/min (12/26/12 1700)  . pantoprozole (PROTONIX) infusion 8 mg/hr (12/26/12 1700)  . phenylephrine (NEO-SYNEPHRINE) Adult infusion Stopped (12/24/12 0000)    PRN Medications: acetaminophen (TYLENOL) oral liquid 160 mg/5 mL, diphenhydrAMINE, fentaNYL, midazolam, morphine injection, ondansetron (ZOFRAN) IV, ondansetron (ZOFRAN) IV, sodium chloride, sodium chloride   Assessment:  1 status post implantable LVAD with subsequent severe RV dysfunction requiring right ventricular assist device- now removed after 1 week support 2 coagulopathy and bleeding requiring transfusion now improved 3 UGI bleeding- M-W tear, treated 4 Chest open for RV dysfunction, return to OR in AM for washout, poss closure Plan/Discussion:   Low  dose IV heparin  Support native RV fx  Nutrition w/ TF tomorrow   I reviewed the LVAD parameters from today, and compared the results to the patient's prior recorded data.  No programming changes were made.  The LVAD is functioning within specified parameters.  The patient performs LVAD self-test daily.  LVAD interrogation was negative for any significant power changes, alarms or PI events/speed drops.  LVAD equipment check completed and is in good working order.  Back-up equipment present.   LVAD education done on emergency procedures and precautions and reviewed exit site care.  Length of Stay: 18  [name] III,PETER 12/26/2012, 5:25 PM

## 2012-12-26 NOTE — Consult Note (Signed)
[name] Consult: 8:31 AM 12/26/2012   Referring Provider: Gala Romney MD Primary Care Physician:  Evlyn Courier, MD Primary Gastroenterologist:  None found Daughters Antoinette:  Cell phone [phone]                   [facility] cell # [phone]   Reason for Consultation:  Blood per NGT  HPI: Kaitlin [name] is a 67 y.o. female.  Hx severe hypertensive induced cardiomyopathy. S/p ICD in 2010 for V tach.  Underwent LVAD placement and tricuspid valve repair 12/18/12, s/p emergent temporary RVAD (right ventricular assist device) 12/19/12, RVAD removed 12/25/12.   Has required transfusions with multiple blood products, including total of 17 units PRBCs, some during surgery yesterday, 2 post surgery along with platelets. Heparin was turned off 8/24 but just resumed this AM.    Tube feeds discontinued, TPN initiated due to emesis on 8/23-8/24.  Both OG tube feed and TPN are off at present.  MD noted ileus on 8/24 though no abdominal imaging confirms this. Last/only GI imaging was non-contrast CT of 12/11/12 showing possible constipation.   Blood per OGT noted 1740 yesterday.  Has passed about 90  Cc of frankly bloody emesis since 0700 this AM.  No melena or recent stools reported for several days.  Hgb has gone from 7.3 - 7.5 yesterday to 9.0 this AM.  Was as low as 5.3 on 8/19.  In 08/2012 hgb ranged 12.5 - 13.5.  On BID Protonix. Pre albumin is low at 5.9.  LFTs normal. BUN up to 24 from 18.    No hx of any GI problems. No PPI or H2 blocker in use PTA.  No records of endoscopic GI studies.     Past Medical History  Diagnosis Date  . Paroxysmal supraventricular tachycardia   . Hypokalemia   . Chronic systolic heart failure     a. 10/8293 Echo: EF 15%, mod to sev antlat and inflat HK, inf AK, mild to mod MR, mildly/mod reduced RV fxn, Mod TR, PASP .  [name] History of DVT (deep vein thrombosis)   . Dyslipidemia   . HTN (hypertension)    . Nonischemic cardiomyopathy     a. 06/2012 Echo: EF 15%;  b. 06/2012 Cath: nl except luminal irregs in LAD.  [name] Ventricular tachycardia     a. s/p ICD  . Goiter   . Implantable cardiac defibrillator- Biotronik     Single chamber  . [name] (left bundle branch block)     intermittent  . Palliative care patient     Past Surgical History  Procedure Laterality Date  . None    . Cardiac defibrillator placement    . Insertion of implantable left ventricular assist device N/A 12/18/2012    Procedure: INSERTION OF IMPLANTABLE LEFT VENTRICULAR ASSIST DEVICE;  Surgeon: Kerin Perna, MD;  Location: [facility] OR;  Service: Open Heart Surgery;  Laterality: N/A;  . Intraoperative transesophageal echocardiogram N/A 12/18/2012    Procedure: INTRAOPERATIVE TRANSESOPHAGEAL ECHOCARDIOGRAM;  Surgeon: Kerin Perna, MD;  Location: [facility] OR;  Service: Open Heart Surgery;  Laterality: N/A;  . Tricuspid valve replacement N/A 12/18/2012    Procedure: TRICUSPID VALVE REPAIR;  Surgeon: Kerin Perna, MD;  Location: [facility] OR;  Service: Open Heart Surgery;  Laterality: N/A;  . Insertion of implantable left ventricular assist device N/A 12/18/2012    Procedure: INSERTION OF IMPLANTABLE RIGHT VENTRICULAR ASSIST DEVICE;  Surgeon: Kerin Perna, MD;  Location: [facility] OR;  Service: Open  Heart Surgery;  Laterality: N/A;    Prior to Admission medications   Medication Sig Start Date End Date Taking? Authorizing Provider  acetaminophen (TYLENOL) 325 MG tablet Take 650 mg by mouth every 6 (six) hours as needed for pain.   Yes Historical Provider, MD  amiodarone (PACERONE) 200 MG tablet Take 300 mg by mouth daily. Take 200 mg q am and 100mg  q pm 12/01/12  Yes Marinus Maw, MD  aspirin EC 81 MG tablet Take 81 mg by mouth daily.   Yes Historical Provider, MD  carvedilol (COREG) 25 MG tablet Take 1 tablet (25 mg total) by mouth 2 (two) times daily with a meal. 11/01/12  Yes [name] D [name], NP  Cyanocobalamin (B-12 PO) Take 1 tablet by mouth  daily.   Yes Historical Provider, MD  furosemide (LASIX) 40 MG tablet Take 40 mg by mouth daily.   Yes Historical Provider, MD  lisinopril (PRINIVIL,ZESTRIL) 10 MG tablet Take 0.5 tablets (5 mg total) by mouth daily. 11/09/12  Yes [name] D [name], NP  simvastatin (ZOCOR) 40 MG tablet TAKE 1 TABLET AT BEDTIME 10/31/12  Yes Rollene Rotunda, MD    Scheduled Meds: . antiseptic oral rinse  15 mL Mouth Rinse QID  . aspirin  324 mg Per Tube Daily  . aspirin  300 mg Rectal Daily  . bisacodyl  10 mg Oral Daily   Or  . bisacodyl  10 mg Rectal Daily  . cefTAZidime (FORTAZ)  IV  1 g Intravenous Q8H  . chlorhexidine  15 mL Mouth Rinse BID  . docusate  200 mg Per Tube Daily  . insulin aspart  0-24 Units Subcutaneous Q4H  . levalbuterol  1.25 mg Nebulization Q6H  . magic mouthwash  5 mL Oral QID  . mupirocin ointment  1 application Topical BID  . pantoprazole (PROTONIX) IV  40 mg Intravenous Q12H  . sodium chloride  10 mL Intravenous Q12H  . vancomycin  750 mg Intravenous Q8H  . Warfarin - Physician Dosing Inpatient   Does not apply q1800  . warfarin IV  2.5 mg Intravenous ONCE-1800   Infusions: . sodium chloride Stopped (12/24/12 1240)  . sodium chloride 20 mL/hr at 12/25/12 2021  . sodium chloride 250 mL (12/24/12 1200)  . sodium chloride 250 mL (12/25/12 0220)  . amiodarone (NEXTERONE PREMIX) 360 mg/200 mL dextrose 30 mg/hr (12/26/12 0800)  . dexmedetomidine 0.7 mcg/kg/hr (12/26/12 0800)  . DOBUTamine Stopped (12/23/12 1100)  . DOPamine 3 mcg/kg/min (12/26/12 0800)  . epinephrine 8 mcg/min (12/26/12 0800)  . fentaNYL infusion INTRAVENOUS 125 mcg/hr (12/26/12 0800)  . furosemide (LASIX) infusion    . heparin    . lactated ringers 10 mL/hr at 12/24/12 1300  . midazolam (VERSED) infusion 4 mg/hr (12/26/12 0800)  . nitroGLYCERIN Stopped (12/18/12 1705)  . norepinephrine (LEVOPHED) Adult infusion 10 mcg/min (12/26/12 0800)  . phenylephrine (NEO-SYNEPHRINE) Adult infusion Stopped (12/24/12 0000)    PRN Meds: acetaminophen (TYLENOL) oral liquid 160 mg/5 mL, diphenhydrAMINE, fentaNYL, midazolam, morphine injection, ondansetron (ZOFRAN) IV, ondansetron (ZOFRAN) IV, sodium chloride, sodium chloride   Allergies as of 12/07/2012  . (No Known Allergies)    Family History  Problem Relation Age of Onset  . Emphysema Mother   . Heart disease Mother   . Heart attack Father   . Emphysema Brother     History   Social History  . Marital Status: Single    Spouse Name: N/A    Number of Children: N/A  . Years of Education:  N/A   Occupational History  . 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 Smoking status: Never Smoker   . Smokeless tobacco: Not on file  . Alcohol Use: No  . Drug Use: No  . Sexual Activity: Not on file   Other Topics Concern  . 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 Lives in [place], not married. 2 daughters/           REVIEW OF SYSTEMS: Not able to obtain in sedated, intubated pt.   PHYSICAL EXAM: Vital signs in last 24 hours: Temp:  [97.2 F (36.2 C)-100.9 F (38.3 C)] 100 F (37.8 C) (08/26 0815) Pulse Rate:  [70-99] 73 (08/26 0815) Resp:  [0-30] 21 (08/26 0815) BP: (84)/(77) 84/77 mmHg (08/25 1535) SpO2:  [87 %-100 %] 93 % (08/26 0815) FiO2 (%):  [50 %-60 %] 50 % (08/26 0815) Weight:  [89.6 kg (197 lb 8.5 oz)] 89.6 kg (197 lb 8.5 oz) (08/26 0500)  General: sedated AAF who looks younger than stated age. Intubated and connected to multiple pumps and IV meds.  Head:  No edema  Eyes:  No icterus or conjunctival injection Ears:  Unable to assess   Nose:  No nasal bleeding, no tubes in nose Mouth:  Intubated and OGT in place.  No frank blood from oral cavity.  Lips somewhat swollen Neck:  Lines in place. Lungs:  Moderately rough BS.  On vent with even, unlabored breathing Heart: no heart rate, just machine hum of LVAD Abdomen:  Soft, hypoactive BS.  Not  eliciting tenderness.  No mass, no bruits.  Multiple tubes draining from  Rectal: not done   Musc/Skeltl: no joint swelling or deformity.  Extremities:  No pedal edema  Neurologic:  Unresponsive, sedated Skin:  No telangectasia Tattoos:  none Nodes:  No inguinal adenopathy   Psych:  Not able to asess.  No agitation.   Intake/Output from previous day: 08/25 0701 - 08/26 0700 In: 9438.3 [I.V.:3162.3; Blood:2406; NG/GT:1910; IV Piggyback:1900; TPN:60] Out: 6870 [Urine:2260; Emesis/NG output:2300; Drains:800; Blood:1100; Chest Tube:410] Intake/Output this shift: Total I/O In: 303.4 [I.V.:243.4; NG/GT:60] Out: 180 [Urine:100; Drains:50; Chest Tube:30]  LAB RESULTS:  Recent Labs  12/25/12 1331 12/25/12 2255 12/26/12 0355  WBC 30.3* 21.8* 23.1*  HGB 7.5* 9.4* 9.0*  HCT 21.4* 26.6* 25.2*  PLT 89* 227 262   BMET Lab Results  Component Value Date   NA 130* 12/26/2012   NA 136 12/25/2012   NA 133* 12/25/2012   K 5.2* 12/26/2012   K 3.3* 12/25/2012   K 3.8 12/25/2012   CL 98 12/26/2012   CL 104 12/25/2012   CL 97 12/25/2012   CO2 23 12/26/2012   CO2 23 12/25/2012   CO2 31 12/25/2012   GLUCOSE 142* 12/26/2012   GLUCOSE 160* 12/25/2012   GLUCOSE 152* 12/25/2012   BUN 24* 12/26/2012   BUN 18 12/25/2012   BUN 17 12/25/2012   CREATININE 0.84 12/26/2012   CREATININE 0.70 12/25/2012   CREATININE 0.72 12/25/2012   CALCIUM 8.5 12/26/2012   CALCIUM 6.9* 12/25/2012   CALCIUM 8.1* 12/25/2012   LFT  Recent Labs  12/24/12 0500 12/25/12 0339 12/26/12 0355  PROT 4.3* 4.3* 4.8*  ALBUMIN 1.9* 1.9* 2.8*  AST 28 34 26  ALT 15 18 11   ALKPHOS 80 78 78  BILITOT 1.2 1.1 2.0*   PT/INR Lab Results  Component Value Date   INR 1.25  12/26/2012   INR 1.13 12/22/2012   INR 1.10 12/21/2012   Drugs of Abuse     Component Value Date/Time   LABOPIA NONE DETECTED 05/13/2007 0515   COCAINSCRNUR NONE DETECTED 05/13/2007 0515   LABBENZ NONE DETECTED 05/13/2007 0515   AMPHETMU NONE DETECTED 05/13/2007 0515    THCU NONE DETECTED 05/13/2007 0515   LABBARB  Value: NONE DETECTED        DRUG SCREEN FOR MEDICAL PURPOSES ONLY.  IF CONFIRMATION IS NEEDED FOR ANY PURPOSE, NOTIFY LAB WITHIN 5 DAYS.        LOWEST DETECTABLE LIMITS FOR URINE DRUG SCREEN Drug Class       Cutoff (ng/mL) Amphetamine      1000 Barbiturate      200 Benzodiazepine   200 Cocaine          300 Opiates          300 THC              50 05/13/2007 0515     RADIOLOGY STUDIES: Dg Chest Port 1 View 12/26/2012   .  Findings: Endotracheal tube terminates approximately 2.3 cm above the carina.  Nasogastric tube is followed into the stomach.  Left subclavian AICD lead tip projects in the right ventricle.  Left IJ Swan-Ganz catheter tip projects over the right pulmonary artery. Bilateral chest tubes, mediastinal drain and left ventricular assist device are stable in position.  Right subclavian central line tip projects over the SVC/RA junction are high right atrium.  Heart size stable.  Lungs are low in volume with central pulmonary vascular congestion and increasing left basilar airspace disease. No definite pneumothorax.  Probable small bilateral pleural effusions.  IMPRESSION:  1.  Low lung volumes with increasing left lower lobe air space disease. 2.  Central pulmonary vascular congestion and small bilateral pleural effusions.   Original Report Authenticated By: Leanna Battles, M.D.    CT ABDOMEN WITHOUT CONTRAST 12/11/12 IMPRESSION:  1. No acute abdominal process.  2. Possible right nephrolithiasis.  3. Possible constipation.  4. Cardiomegaly with trace bilateral pleural effusions and  pericardial fluid.    ENDOSCOPIC STUDIES: No EGD or colon studies in Epic  IMPRESSION: *  Upper GI bleed.  Rule out OGT trauma, rule out ulcer, rule out MWT.  IV heparin restarted this AM *  ABL anemia:  Post op losses, GI bleed are sources *  Sp LVAD, tricuspid  Valve repair 8/18 *  Sp emergent temporary RVAD placement 8/19 RVAD removed 8/25, sternal wound  left open with plans to close on 8/27. *  Evolving left lung pneumonia.  On Vanc  *  Protein malnutrition.  Low prealbumin.  Plan is to place post pyloric feeding tube at some point *  Hyperglycemia.  No hx of DM   PLAN: *  EGD at bedsisde 11:30 this AM. Discussed and obtained consent from daughter Sherry Ruffing:  Cell phone [phone], who gave consent.    LOS: 18 days   Jennye Moccasin  12/26/2012, 8:31 AM Pager: [phone]

## 2012-12-26 NOTE — Progress Notes (Addendum)
[employer] rep called to be notified that ICD needs to be turned off before surgery scheduled for 0830 in the AM on 12/27/2012. Rep answered phone and agreed that he would be here prior to surgery to turn the ICD back off.  Phone number rep was contacted at was : [phone]

## 2012-12-26 NOTE — Progress Notes (Signed)
Anesthesiology Follow-up:   POD #1 S/P removal of Centrimag RVAD. She remains sedated on the vent.  Hemodynamically stable on pressors. No low flow events last night.  VS: T- 37.9 BP 92/71 (77) HR 72 PA 34/16 CVP 13-15 CO/CI 3.9/2.2 mixed venous Sat 59%  SIMV 450 FiO2 50% RR 14 PEEP 5 RR 14 Nitric oxide 35 ppm   Pressors: Dopamine 4.2 mcg/kg/min                   Epinephrine 8 mcg/min                   Norepinephrine 9.4 mcg/min                     PH 7.38 PCO2 40.7 PO2 168   VAD: Rate: 8390 RPM Power: 4 watts PI: 5.1   K-5.2 BUN/Cr 24/0.84 H/H 9.0/25.2 Plts 262,000  Patient presently stable S/P RVAD removal yesterday. No signs of RV failure. Plan to return to OR in next 1-2 days if possible to close sternotomy incision.  Kipp Brood, MD

## 2012-12-26 NOTE — Progress Notes (Signed)
NUTRITION FOLLOW-UP  DOCUMENTATION CODES Per approved criteria  -Not Applicable   INTERVENTION: Once appropriate per GI, recommend initiation of elemental formula, Vital AF 1.2 at 10 mL/hr, via post-pyloric tube. If unable to initiate enteral nutrition, recommend resumption of TPN. RD to continue to follow nutrition care plan.  NUTRITION DIAGNOSIS: Inadequate oral intake now related to inability to eat as evidenced by NPO status. Ongoing.  Goal: Intake to meet >90% of estimated nutrition needs. Unmet.  Monitor:  Vent settings, labs, weight trend, post-op healing and recovery, tolerance of nutrition support  ASSESSMENT: Underwent the following 8/18: INSERTION OF LVAD INTRAOPERATIVE TRANSESOPHAGEAL ECHOCARDIOGRAM  TRICUSPID VALVE REPAIR  INSERTION OF RVAD  Underwent the following 8/25: REMOVAL OF RVAD INTRAOPERATIVE TRANSESOPHAGEAL ECHOCARDIOGRAM PLACEMENT OF PA SWAN CATHETER  Pt found to have R ventricular dysfunction s/p LVAD implantation, required trip to OR later that evening for placement of RVAD. RVAD removed 8/25.  Plan to return to OR in next 1-2 days for sternotomy incision closure.  Remains intubated on ventilator support.  MV: 7.2 L/min Temp:Temp (24hrs), Avg:99.7 F (37.6 C), Min:97.2 F (36.2 C), Max:100.9 F (38.3 C) Temp elevated.   Propofol: none  Pt has developed blood NG drainage. TPN is presently off. GI evaluated pt today, plan is for EGD at bedside at 11:30 this morning and place post-pyloric feeding tube "at some point."  Potassium elevated; phosphorus and magnesium are WNL. Most recent prealbumin is low at 5.9 and trending down. Triglycerides elevated at 159.  Height: Ht Readings from Last 1 Encounters:  12/22/12 5\' 9"  (1.753 m)    Weight: Wt Readings from Last 1 Encounters:  12/26/12 197 lb 8.5 oz (89.6 kg)  Admission wt: 165 lb Current wt is up 32 lb.  BMI:  Body mass index is 29.3 kg/(m^2) - using admission weight.  Estimated  Nutritional Needs: Kcal: 1717 Protein: 112 - 131 gm Fluid: 1.5 - 2 L  Skin:  2+ generalized edema Abdomen incision Chest incision - with wound VAC  Diet Order: NPO  EDUCATION NEEDS: -Education needs addressed. Provided many handouts on heart healthy eating, including label reading tips per patient request on 8/15.   Intake/Output Summary (Last 24 hours) at 12/26/12 1057 Last data filed at 12/26/12 1000  Gross per 24 hour  Intake 7853.86 ml  Output   3850 ml  Net 4003.86 ml    Last BM: 8/15  Labs:   Recent Labs Lab 12/24/12 0500  12/25/12 0339  12/25/12 1110 12/25/12 1331 12/26/12 0355  NA 130*  < > 131*  < > 133* 136 130*  K 4.1  < > 3.8  < > 3.8 3.3* 5.2*  CL 94*  < > 97  --   --  104 98  CO2 27  < > 31  --   --  23 23  BUN 13  < > 17  --   --  18 24*  CREATININE <0.20*  < > 0.72  --   --  0.70 0.84  CALCIUM 8.1*  < > 8.1*  --   --  6.9* 8.5  MG 1.8  --  2.1  --   --   --  1.8  PHOS 1.4*  --  1.9*  --   --   --  2.9  GLUCOSE 301*  < > 142*  < > 152* 160* 142*  < > = values in this interval not displayed.  CBG (last 3)   Recent Labs  12/25/12 2349 12/26/12 0347 12/26/12  0735  GLUCAP 111* 139* 139*   Prealbumin  Date/Time Value Range Status  12/25/2012  3:39 AM 5.9* 17.0 - 34.0 mg/dL Final     Performed at [facility]   Triglycerides  Date/Time Value Range Status  12/25/2012  3:39 AM 159* <150 mg/dL Final    Scheduled Meds: . antiseptic oral rinse  15 mL Mouth Rinse QID  . aspirin  324 mg Per Tube Daily  . aspirin  300 mg Rectal Daily  . bisacodyl  10 mg Oral Daily   Or  . bisacodyl  10 mg Rectal Daily  . cefTAZidime (FORTAZ)  IV  1 g Intravenous Q8H  . chlorhexidine  15 mL Mouth Rinse BID  . docusate  200 mg Per Tube Daily  . insulin aspart  0-24 Units Subcutaneous Q4H  . levalbuterol  1.25 mg Nebulization Q6H  . magic mouthwash  5 mL Oral QID  . mupirocin ointment  1 application Topical BID  . pantoprazole (PROTONIX) IV  40 mg  Intravenous Q12H  . sodium chloride  10 mL Intravenous Q12H  . vancomycin  750 mg Intravenous Q8H  . Warfarin - Physician Dosing Inpatient   Does not apply q1800  . warfarin IV  2.5 mg Intravenous ONCE-1800    Continuous Infusions: . sodium chloride Stopped (12/24/12 1240)  . sodium chloride 20 mL/hr at 12/25/12 2021  . sodium chloride 250 mL (12/24/12 1200)  . sodium chloride 250 mL (12/25/12 0220)  . amiodarone (NEXTERONE PREMIX) 360 mg/200 mL dextrose 30 mg/hr (12/26/12 1000)  . dexmedetomidine 0.6 mcg/kg/hr (12/26/12 1000)  . DOBUTamine Stopped (12/23/12 1100)  . DOPamine 3 mcg/kg/min (12/26/12 1000)  . epinephrine 7.733 mcg/min (12/26/12 1000)  . fentaNYL infusion INTRAVENOUS 125 mcg/hr (12/26/12 1000)  . furosemide (LASIX) infusion 4 mg/hr (12/26/12 1000)  . heparin 500 Units/hr (12/26/12 1000)  . lactated ringers 10 mL/hr at 12/24/12 1300  . midazolam (VERSED) infusion 4 mg/hr (12/26/12 1000)  . nitroGLYCERIN Stopped (12/18/12 1705)  . norepinephrine (LEVOPHED) Adult infusion 10 mcg/min (12/26/12 1000)  . phenylephrine (NEO-SYNEPHRINE) Adult infusion Stopped (12/24/12 0000)    Jarold Motto MS, RD, LDN Pager: [phone] After-hours pager: [phone]

## 2012-12-27 ENCOUNTER — Inpatient Hospital Stay (HOSPITAL_COMMUNITY): Payer: Commercial Managed Care - PPO | Admitting: Anesthesiology

## 2012-12-27 ENCOUNTER — Encounter (HOSPITAL_COMMUNITY): Payer: Self-pay | Admitting: Internal Medicine

## 2012-12-27 ENCOUNTER — Encounter (HOSPITAL_COMMUNITY)
Admission: RE | Disposition: A | Discharge status: Home Health | Payer: Self-pay | Source: Ambulatory Visit | Attending: Cardiothoracic Surgery

## 2012-12-27 ENCOUNTER — Encounter (HOSPITAL_COMMUNITY): Payer: Self-pay | Admitting: Anesthesiology

## 2012-12-27 ENCOUNTER — Inpatient Hospital Stay (HOSPITAL_COMMUNITY): Payer: Commercial Managed Care - PPO

## 2012-12-27 DIAGNOSIS — I5022 Chronic systolic (congestive) heart failure: Secondary | ICD-10-CM

## 2012-12-27 DIAGNOSIS — Z95818 Presence of other cardiac implants and grafts: Secondary | ICD-10-CM

## 2012-12-27 DIAGNOSIS — K226 Gastro-esophageal laceration-hemorrhage syndrome: Secondary | ICD-10-CM

## 2012-12-27 DIAGNOSIS — I428 Other cardiomyopathies: Secondary | ICD-10-CM

## 2012-12-27 HISTORY — PX: STERNAL CLOSURE: SHX6203

## 2012-12-27 HISTORY — PX: MEDIASTINAL EXPLORATION: SHX5065

## 2012-12-27 LAB — POCT I-STAT 3, VENOUS BLOOD GAS (G3P V)
Acid-base deficit: 1 mmol/L (ref 0.0–2.0)
Acid-base deficit: 1 mmol/L (ref 0.0–2.0)
Bicarbonate: 23.4 mEq/L (ref 20.0–24.0)
Bicarbonate: 23.2 mEq/L (ref 20.0–24.0)
O2 Saturation: 55 %
O2 Saturation: 59 %
Patient temperature: 37.8
TCO2: 25 mmol/L (ref 0–100)
pCO2, Ven: 36 mmHg — ABNORMAL LOW (ref 45.0–50.0)
pH, Ven: 7.424 — ABNORMAL HIGH (ref 7.250–7.300)

## 2012-12-27 LAB — POCT I-STAT 3, ART BLOOD GAS (G3+)
Acid-Base Excess: 1 mmol/L (ref 0.0–2.0)
Acid-Base Excess: 1 mmol/L (ref 0.0–2.0)
Acid-base deficit: 2 mmol/L (ref 0.0–2.0)
Bicarbonate: 21.7 mEq/L (ref 20.0–24.0)
O2 Saturation: 100 %
O2 Saturation: 98 %
Patient temperature: 99.7
Patient temperature: 38.4
TCO2: 24 mmol/L (ref 0–100)
pCO2 arterial: 34.1 mmHg — ABNORMAL LOW (ref 35.0–45.0)
pH, Arterial: 7.437 (ref 7.350–7.450)
pH, Arterial: 7.437 (ref 7.350–7.450)
pH, Arterial: 7.469 — ABNORMAL HIGH (ref 7.350–7.450)

## 2012-12-27 LAB — CARBOXYHEMOGLOBIN
Carboxyhemoglobin: 1.3 % (ref 0.5–1.5)
Carboxyhemoglobin: 1.3 % (ref 0.5–1.5)
Carboxyhemoglobin: 1.4 % (ref 0.5–1.5)
Methemoglobin: 1.8 % — ABNORMAL HIGH (ref 0.0–1.5)
Methemoglobin: 2.4 % — ABNORMAL HIGH (ref 0.0–1.5)
Methemoglobin: 1.7 % — ABNORMAL HIGH (ref 0.0–1.5)
O2 Saturation: 58.7 %
O2 Saturation: 55.5 %
O2 Saturation: 50.4 %
Total hemoglobin: 9.7 g/dL — ABNORMAL LOW (ref 12.0–16.0)
Total hemoglobin: 9.1 g/dL — ABNORMAL LOW (ref 12.0–16.0)
Total hemoglobin: 9.3 g/dL — ABNORMAL LOW (ref 12.0–16.0)

## 2012-12-27 LAB — TYPE AND SCREEN
ABO/RH(D): A POS
Antibody Screen: NEGATIVE
Unit division: 0
Unit division: 0
Unit division: 0
Unit division: 0
Unit division: 0

## 2012-12-27 LAB — BASIC METABOLIC PANEL
BUN: 19 mg/dL (ref 6–23)
CO2: 22 mEq/L (ref 19–32)
Calcium: 9.1 mg/dL (ref 8.4–10.5)
Chloride: 96 mEq/L (ref 96–112)
Creatinine, Ser: 0.74 mg/dL (ref 0.50–1.10)
GFR calc Af Amer: >90 mL/min (ref >90)
GFR calc non Af Amer: 87 mL/min — ABNORMAL LOW (ref >90)
Glucose, Bld: 143 mg/dL — ABNORMAL HIGH (ref 70–99)
Potassium: 3.5 mEq/L (ref 3.5–5.1)
Sodium: 128 mEq/L — ABNORMAL LOW (ref 135–145)

## 2012-12-27 LAB — COMPREHENSIVE METABOLIC PANEL
ALT: 11 U/L (ref 0–35)
AST: 23 U/L (ref 0–37)
Albumin: 2.2 g/dL — ABNORMAL LOW (ref 3.5–5.2)
Alkaline Phosphatase: 120 U/L — ABNORMAL HIGH (ref 39–117)
BUN: 21 mg/dL (ref 6–23)
CO2: 24 mEq/L (ref 19–32)
Calcium: 8.3 mg/dL — ABNORMAL LOW (ref 8.4–10.5)
Chloride: 97 mEq/L (ref 96–112)
Creatinine, Ser: 0.81 mg/dL (ref 0.50–1.10)
GFR calc Af Amer: 86 mL/min — ABNORMAL LOW (ref >90)
GFR calc non Af Amer: 75 mL/min — ABNORMAL LOW (ref >90)
Glucose, Bld: 137 mg/dL — ABNORMAL HIGH (ref 70–99)
Potassium: 3.5 mEq/L (ref 3.5–5.1)
Sodium: 129 mEq/L — ABNORMAL LOW (ref 135–145)
Total Bilirubin: 1.5 mg/dL — ABNORMAL HIGH (ref 0.3–1.2)
Total Protein: 4.7 g/dL — ABNORMAL LOW (ref 6.0–8.3)

## 2012-12-27 LAB — CBC
HCT: 28.2 % — ABNORMAL LOW (ref 36.0–46.0)
HCT: 26.1 % — ABNORMAL LOW (ref 36.0–46.0)
HCT: 25.6 % — ABNORMAL LOW (ref 36.0–46.0)
Hemoglobin: 9.9 g/dL — ABNORMAL LOW (ref 12.0–15.0)
Hemoglobin: 9.1 g/dL — ABNORMAL LOW (ref 12.0–15.0)
Hemoglobin: 9.2 g/dL — ABNORMAL LOW (ref 12.0–15.0)
MCH: 29.6 pg (ref 26.0–34.0)
MCH: 29.4 pg (ref 26.0–34.0)
MCH: 30.4 pg (ref 26.0–34.0)
MCHC: 35.1 g/dL (ref 30.0–36.0)
MCHC: 34.9 g/dL (ref 30.0–36.0)
MCHC: 35.9 g/dL (ref 30.0–36.0)
MCV: 84.2 fL (ref 78.0–100.0)
MCV: 84.5 fL (ref 78.0–100.0)
MCV: 84.5 fL (ref 78.0–100.0)
Platelets: 297 10*3/uL (ref 150–400)
Platelets: 258 10*3/uL (ref 150–400)
Platelets: 282 10*3/uL (ref 150–400)
RBC: 3.35 MIL/uL — ABNORMAL LOW (ref 3.87–5.11)
RBC: 3.09 MIL/uL — ABNORMAL LOW (ref 3.87–5.11)
RBC: 3.03 MIL/uL — ABNORMAL LOW (ref 3.87–5.11)
RDW: 15.6 % — ABNORMAL HIGH (ref 11.5–15.5)
RDW: 15.8 % — ABNORMAL HIGH (ref 11.5–15.5)
RDW: 15.8 % — ABNORMAL HIGH (ref 11.5–15.5)
WBC: 24.1 10*3/uL — ABNORMAL HIGH (ref 4.0–10.5)
WBC: 22.2 10*3/uL — ABNORMAL HIGH (ref 4.0–10.5)
WBC: 25.2 10*3/uL — ABNORMAL HIGH (ref 4.0–10.5)

## 2012-12-27 LAB — GLUCOSE, CAPILLARY
Glucose-Capillary: 125 mg/dL — ABNORMAL HIGH (ref 70–99)
Glucose-Capillary: 153 mg/dL — ABNORMAL HIGH (ref 70–99)

## 2012-12-27 LAB — POCT I-STAT 4, (NA,K, GLUC, HGB,HCT)
Potassium: 4 mEq/L (ref 3.5–5.1)
Sodium: 131 mEq/L — ABNORMAL LOW (ref 135–145)

## 2012-12-27 LAB — APTT
aPTT: 41 seconds — ABNORMAL HIGH (ref 24–37)
aPTT: 29 seconds (ref 24–37)
aPTT: 39 seconds — ABNORMAL HIGH (ref 24–37)

## 2012-12-27 LAB — LACTATE DEHYDROGENASE: LDH: 334 U/L — ABNORMAL HIGH (ref 94–250)

## 2012-12-27 LAB — CALCIUM, IONIZED: Calcium, Ion: 1.24 mmol/L (ref 1.13–1.30)

## 2012-12-27 SURGERY — CLOSURE, STERNUM
Anesthesia: General | Site: Chest | Wound class: Clean

## 2012-12-27 MED ORDER — DEXMEDETOMIDINE HCL IN NACL 200 MCG/50ML IV SOLN
0.4000 ug/kg/h | INTRAVENOUS | Status: AC
  Filled 2012-12-27 (×2): qty 50

## 2012-12-27 MED ORDER — CALCIUM CHLORIDE 10 % IV SOLN
INTRAVENOUS | Status: DC | PRN
  Administered 2012-12-27: 1 g via INTRAVENOUS

## 2012-12-27 MED ORDER — ACETAMINOPHEN 10 MG/ML IV SOLN
1000.0000 mg | Freq: Once | INTRAVENOUS | Status: AC
  Administered 2012-12-27: 1000 mg via INTRAVENOUS

## 2012-12-27 MED ORDER — AMIODARONE HCL IN DEXTROSE 360-4.14 MG/200ML-% IV SOLN
30.0000 mg/h | INTRAVENOUS | Status: DC
  Administered 2012-12-27 – 2012-12-29 (×5): 30 mg/h via INTRAVENOUS
  Filled 2012-12-27 (×14): qty 200

## 2012-12-27 MED ORDER — ROCURONIUM BROMIDE 100 MG/10ML IV SOLN
INTRAVENOUS | Status: DC | PRN
  Administered 2012-12-27 (×2): 50 mg via INTRAVENOUS

## 2012-12-27 MED ORDER — SODIUM CHLORIDE 0.9 % IV SOLN
8.0000 mg/h | INTRAVENOUS | Status: DC
  Filled 2012-12-27: qty 80

## 2012-12-27 MED ORDER — VANCOMYCIN HCL 1000 MG IV SOLR
2000.0000 mg | INTRAVENOUS | Status: DC
  Filled 2012-12-27: qty 2000

## 2012-12-27 MED ORDER — POTASSIUM CHLORIDE 10 MEQ/50ML IV SOLN
10.0000 meq | INTRAVENOUS | Status: DC | PRN
  Administered 2012-12-27: 10 meq via INTRAVENOUS
  Filled 2012-12-27 (×3): qty 150

## 2012-12-27 MED ORDER — VANCOMYCIN HCL 1000 MG IV SOLR
INTRAVENOUS | Status: DC | PRN
  Administered 2012-12-27: 1 g

## 2012-12-27 MED ORDER — POTASSIUM CHLORIDE 10 MEQ/50ML IV SOLN
INTRAVENOUS | Status: AC
  Administered 2012-12-27: 10 meq
  Filled 2012-12-27: qty 150

## 2012-12-27 MED ORDER — ALBUMIN HUMAN 5 % IV SOLN
INTRAVENOUS | Status: DC | PRN
  Administered 2012-12-27: 11:00:00 via INTRAVENOUS

## 2012-12-27 MED ORDER — POTASSIUM CHLORIDE 10 MEQ/50ML IV SOLN
10.0000 meq | INTRAVENOUS | Status: AC | PRN
  Administered 2012-12-27 (×3): 10 meq via INTRAVENOUS
  Filled 2012-12-27: qty 150

## 2012-12-27 MED ORDER — SODIUM CHLORIDE 0.9 % IR SOLN
Status: DC | PRN
  Administered 2012-12-27: 3000 mL

## 2012-12-27 MED ORDER — POTASSIUM CHLORIDE 10 MEQ/50ML IV SOLN: 10.0000 meq | INTRAVENOUS | Status: DC | PRN

## 2012-12-27 MED ORDER — AMIODARONE HCL IN DEXTROSE 360-4.14 MG/200ML-% IV SOLN
30.0000 mg/h | INTRAVENOUS | Status: AC
  Administered 2012-12-27: 30 mg/h via INTRAVENOUS
  Filled 2012-12-27: qty 200

## 2012-12-27 MED ORDER — VECURONIUM BROMIDE 10 MG IV SOLR
INTRAVENOUS | Status: DC | PRN
  Administered 2012-12-27: 10 mg via INTRAVENOUS

## 2012-12-27 MED ORDER — DOBUTAMINE IN D5W 4-5 MG/ML-% IV SOLN
2.5000 ug/kg/min | INTRAVENOUS | Status: AC
  Administered 2012-12-27: 4 ug/kg/min via INTRAVENOUS
  Filled 2012-12-27: qty 250

## 2012-12-27 MED ORDER — DOPAMINE-DEXTROSE 3.2-5 MG/ML-% IV SOLN
3.0000 ug/kg/min | INTRAVENOUS | Status: AC
  Filled 2012-12-27: qty 250

## 2012-12-27 MED ORDER — HEPARIN (PORCINE) IN NACL 100-0.45 UNIT/ML-% IJ SOLN
900.0000 [IU]/h | INTRAMUSCULAR | Status: DC
  Filled 2012-12-27 (×2): qty 250

## 2012-12-27 MED ORDER — SODIUM CHLORIDE 0.9 % IJ SOLN
OROMUCOSAL | Status: DC | PRN
  Administered 2012-12-27: 10:00:00 via TOPICAL

## 2012-12-27 MED FILL — Epinephrine HCl Soln Prefilled Syringe 0.1 MG/ML: INTRAMUSCULAR | Qty: 40 | Status: AC

## 2012-12-27 SURGICAL SUPPLY — 64 items
APPLICATOR CHLORAPREP 3ML ORNG (MISCELLANEOUS) ×6 IMPLANT
BAG DECANTER FOR FLEXI CONT (MISCELLANEOUS) IMPLANT
BENZOIN TINCTURE PRP APPL 2/3 (GAUZE/BANDAGES/DRESSINGS) IMPLANT
BINDER BREAST LRG (GAUZE/BANDAGES/DRESSINGS) ×3 IMPLANT
BLADE SAW SAG 29X58X.64 (BLADE) IMPLANT
CANISTER SUCTION 2500CC (MISCELLANEOUS) ×3 IMPLANT
CATH THORACIC 28FR RT ANG (CATHETERS) IMPLANT
CATH THORACIC 36FR (CATHETERS) IMPLANT
CATH THORACIC 36FR RT ANG (CATHETERS) ×3 IMPLANT
CHLORAPREP W/TINT 10.5 ML (MISCELLANEOUS) ×3 IMPLANT
CLOTH BEACON ORANGE TIMEOUT ST (SAFETY) ×3 IMPLANT
CONN 3/8X3/8 GISH STERILE (MISCELLANEOUS) ×3 IMPLANT
CONN ST 1/4X3/8  BEN (MISCELLANEOUS) ×4
CONN ST 1/4X3/8 BEN (MISCELLANEOUS) ×2 IMPLANT
CONN Y 3/8X3/8X3/8  BEN (MISCELLANEOUS)
CONN Y 3/8X3/8X3/8 BEN (MISCELLANEOUS) IMPLANT
CONT SPEC 4OZ CLIKSEAL STRL BL (MISCELLANEOUS) IMPLANT
DRAPE CARDIOVASCULAR INCISE (DRAPES) ×2
DRAPE LAPAROSCOPIC ABDOMINAL (DRAPES) IMPLANT
DRAPE SLUSH/WARMER DISC (DRAPES) ×3 IMPLANT
DRAPE SRG 135X102X78XABS (DRAPES) ×1 IMPLANT
DRSG AQUACEL AG ADV 3.5X14 (GAUZE/BANDAGES/DRESSINGS) ×3 IMPLANT
DRSG COVADERM 4X14 (GAUZE/BANDAGES/DRESSINGS) IMPLANT
DRSG PAD ABDOMINAL 8X10 ST (GAUZE/BANDAGES/DRESSINGS) IMPLANT
ELECT REM PT RETURN 9FT ADLT (ELECTROSURGICAL) ×3
ELECTRODE REM PT RTRN 9FT ADLT (ELECTROSURGICAL) ×1 IMPLANT
GAUZE XEROFORM 1X8 LF (GAUZE/BANDAGES/DRESSINGS) ×3 IMPLANT
GAUZE XEROFORM 5X9 LF (GAUZE/BANDAGES/DRESSINGS) IMPLANT
GLOVE BIO SURGEON STRL SZ7.5 (GLOVE) ×6 IMPLANT
GOWN STRL NON-REIN LRG LVL3 (GOWN DISPOSABLE) ×15 IMPLANT
HEMOSTAT POWDER SURGIFOAM 1G (HEMOSTASIS) ×9 IMPLANT
HEMOSTAT SURGICEL 2X14 (HEMOSTASIS) IMPLANT
KIT BASIN OR (CUSTOM PROCEDURE TRAY) ×3 IMPLANT
KIT ROOM TURNOVER OR (KITS) ×3 IMPLANT
KIT SUCTION CATH 14FR (SUCTIONS) ×3 IMPLANT
NS IRRIG 1000ML POUR BTL (IV SOLUTION) ×9 IMPLANT
PACK CHEST (CUSTOM PROCEDURE TRAY) ×3 IMPLANT
PAD ARMBOARD 7.5X6 YLW CONV (MISCELLANEOUS) ×6 IMPLANT
PIN SAFETY STERILE (MISCELLANEOUS) IMPLANT
RUBBERBAND STERILE (MISCELLANEOUS) IMPLANT
SPONGE GAUZE 4X4 12PLY (GAUZE/BANDAGES/DRESSINGS) ×3 IMPLANT
SPONGE LAP 18X18 X RAY DECT (DISPOSABLE) ×3 IMPLANT
STAPLER VISISTAT 35W (STAPLE) IMPLANT
STRAP MONTGOMERY 1.25X11-1/8 (MISCELLANEOUS) IMPLANT
SUT ETHILON 3 0 FSL (SUTURE) ×21 IMPLANT
SUT SILK  1 MH (SUTURE) ×8
SUT SILK 1 MH (SUTURE) ×4 IMPLANT
SUT STEEL 6MS V (SUTURE) IMPLANT
SUT STEEL STERNAL CCS#1 18IN (SUTURE) ×3 IMPLANT
SUT STEEL SZ 6 DBL 3X14 BALL (SUTURE) ×6 IMPLANT
SUT STERNA BAND STERNOTOMY (SUTURE) IMPLANT
SUT VIC AB 1 CTX 18 (SUTURE) ×9 IMPLANT
SUT VIC AB 1 CTX 36 (SUTURE) ×4
SUT VIC AB 1 CTX36XBRD ANBCTR (SUTURE) ×2 IMPLANT
SUT VIC AB 2-0 CTX 27 (SUTURE) ×15 IMPLANT
SUT VIC AB 3-0 SH 8-18 (SUTURE) ×6 IMPLANT
SUT VIC AB 3-0 X1 27 (SUTURE) ×6 IMPLANT
SWAB COLLECTION DEVICE MRSA (MISCELLANEOUS) IMPLANT
SYSTEM SAHARA CHEST DRAIN ATS (WOUND CARE) ×6 IMPLANT
TAPE CLOTH SURG 4X10 WHT LF (GAUZE/BANDAGES/DRESSINGS) ×3 IMPLANT
TOWEL OR 17X24 6PK STRL BLUE (TOWEL DISPOSABLE) ×3 IMPLANT
TOWEL OR 17X26 10 PK STRL BLUE (TOWEL DISPOSABLE) ×9 IMPLANT
TUBE ANAEROBIC SPECIMEN COL (MISCELLANEOUS) IMPLANT
WATER STERILE IRR 1000ML POUR (IV SOLUTION) ×3 IMPLANT

## 2012-12-27 NOTE — Progress Notes (Addendum)
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 EF 15-25% with mild to moderate RV dysfunction  Underwent HM II LVAD implant (under destination criteria) along with TV repair on 8/18.  She was taken back to the OR for RVAD support.   On 8/25 was taken back to OR for RVAD explant.  Yesterday underwent EGD which showed MW tear in esophagus. Injected and clipped. No further bleeding. Epi weaned to 6.  CVP 18-19  range. Several low flow alarms on VAD near time of EGD. None since.  Co-ox 58%. Renal function stable. Weight up down 1 pound on lasix drip at 4  Chest remains open with wound vac.   LVAD INTERROGATION:  HeartMate II LVAD:  Flow --  liters/min, speed 8400, power 4, PI 6.9.    Objective:    Vital Signs:   Temp:  [99.1 F (37.3 C)-100.9 F (38.3 C)] 100.2 F (37.9 C) (08/27 0700) Pulse Rate:  [25-97] 94 (08/27 0700) Resp:  [11-23] 19 (08/27 0700) BP: (78-85)/(70-74) 85/74 mmHg (08/26 1513) SpO2:  [56 %-100 %] 91 % (08/27 0700) FiO2 (%):  [50 %] 50 % (08/27 0600) Weight:  [89.2 kg (196 lb 10.4 oz)] 89.2 kg (196 lb 10.4 oz) (08/27 0500) Last BM Date: 12/15/12 Mean arterial Pressure 60-70s  Intake/Output:   Intake/Output Summary (Last 24 hours) at 12/27/12 0759 Last data filed at 12/27/12 0700  Gross per 24 hour  Intake 5463.63 ml  Output   4760 ml  Net 703.63 ml     Physical Exam: General: Intubated and sedated,  HEENT: normal Neck: supple. CVP 18. Carotids 2+ bilat; no bruits. No lymphadenopathy or thryomegaly appreciated.  Cor: Mechanical heart sounds with LVAD hum present.; Chest open with wound vac Lungs: course Abdomen: soft, nontender, mildly distended. Hypoactive BS Driveline: C/D/I; securement device intact  Extremities: no cyanosis, clubbing, rash, 2+ edema Neuro: alert & orientedx3, cranial nerves grossly intact. moves all 4 extremities w/o difficulty.  Affect pleasant  Telemetry: AV paced 80s  Labs: Basic Metabolic Panel:  Recent Labs Lab 12/22/12 0432  12/23/12 0402  12/24/12 0500  12/25/12 0339  12/25/12 1110 12/25/12 1331 12/26/12 0355 12/26/12 1500 12/27/12 0450  NA 133*  < > 133*  < > 130*  < > 131*  < > 133* 136 130* 131* 129*  K 3.4*  < > 3.7  < > 4.1  < > 3.8  < > 3.8 3.3* 5.2* 4.3 3.5  CL 97  < > 98  < > 94*  < > 97  --   --  104 98 101 97  CO2 28  < > 29  < > 27  < > 31  --   --  23 23 23 24   GLUCOSE 115*  < > 126*  < > 301*  < > 142*  < > 152* 160* 142* 139* 137*  BUN 18  < > 22  < > 13  < > 17  --   --  18 24* 22 21  CREATININE 0.93  < > 0.76  < > <0.20*  < > 0.72  --   --  0.70 0.84 0.76 0.81  CALCIUM 8.3*  < > 8.2*  < > 8.1*  < > 8.1*  --   --  6.9* 8.5 7.8* 8.3*  MG 1.9  --  2.3  --  1.8  --  2.1  --   --   --  1.8  --   --   PHOS 2.5  --  2.1*  --  1.4*  --  1.9*  --   --   --  2.9  --   --   < > = values in this interval not displayed.  Liver Function Tests:  Recent Labs Lab 12/23/12 0402 12/24/12 0500 12/25/12 0339 12/26/12 0355 12/27/12 0450  AST 31 28 34 26 23  ALT 16 15 18 11 11   ALKPHOS 94 80 78 78 120*  BILITOT 1.8* 1.2 1.1 2.0* 1.5*  PROT 4.8* 4.3* 4.3* 4.8* 4.7*  ALBUMIN 2.3* 1.9* 1.9* 2.8* 2.2*   No results found for this basename: LIPASE, AMYLASE,  in the last 168 hours No results found for this basename: AMMONIA,  in the last 168 hours  CBC:  Recent Labs Lab 12/21/12 0420  12/24/12 0500  12/25/12 0339  12/25/12 1331 12/25/12 2255 12/26/12 0355 12/26/12 1500 12/27/12 0450  WBC 13.4*  < > 17.2*  --  19.3*  --  30.3* 21.8* 23.1* 23.9* 24.1*  NEUTROABS 11.5*  --  12.9*  --  15.1*  --   --   --   --   --   --   HGB 9.3*  < > 7.5*  < > 7.3*  < > 7.5* 9.4* 9.0* 7.9* 9.9*  HCT 26.7*  < > 22.5*  < > 21.3*  < > 21.4* 26.6* 25.2* 22.5* 28.2*  MCV 84.5  < > 87.5  --  87.3  --  84.9 84.2 84.3 85.2 84.2  PLT 94*  < > 158  --  178  --  89* 227 262 275 297  < > = values in this interval  not displayed.  INR:  Recent Labs Lab 12/21/12 0420 12/22/12 0432 12/26/12 0355  INR 1.10 1.13 1.25    Imaging: Dg Chest [facility] 1 View  12/26/2012   *RADIOLOGY REPORT*  Clinical Data: Removal of ventricular assist device.  PORTABLE CHEST - 1 VIEW  Comparison: 12/25/2012.  Findings: Endotracheal tube terminates approximately 2.3 cm above the carina.  Nasogastric tube is followed into the stomach.  Left subclavian AICD lead tip projects in the right ventricle.  Left IJ Swan-Ganz catheter tip projects over the right pulmonary artery. Bilateral chest tubes, mediastinal drain and left ventricular assist device are stable in position.  Right subclavian central line tip projects over the SVC/RA junction are high right atrium.  Heart size stable.  Lungs are low in volume with central pulmonary vascular congestion and increasing left basilar airspace disease. No definite pneumothorax.  Probable small bilateral pleural effusions.  IMPRESSION:  1.  Low lung volumes with increasing left lower lobe air space disease. 2.  Central pulmonary vascular congestion and small bilateral pleural effusions.   Original Report Authenticated By: Leanna Battles, M.D.   Dg Chest Port 1 View  12/25/2012   *RADIOLOGY REPORT*  Clinical Data: Status post removal of right ventricular assist device.  Addition of Swan-Ganz catheter.  PORTABLE CHEST - 1 VIEW  Comparison: Chest x-ray 12/25/2012.  Findings: Previously noted cannula from right ventricular assist device has been removed.  Left ventricular assist device remains in position with a inflow cannula oriented in line with the expected left ventricular long axis.  Bilateral chest tubes are in position. There has been interval placement of a Swan-Ganz catheter with tip in the proximal right pulmonary artery, the left internal jugular  approach.  Previously noted right IJ Cordis and right IJ central venous catheter remain in position, and there has also been placement of a right  subclavian central venous catheter.  The tips of these catheters are difficult to localize secondary to overlapping structures, but they appear to be centrally positioned within the vena cava. An endotracheal tube is in place with tip 2.4 cm above the carina. Left-sided pacemaker / AICD with lead tip terminating in the right ventricular apex.  Status post tricuspid annuloplasty. A nasogastric tube is seen extending into the stomach, however, the tip of the nasogastric tube extends below the lower margin of the image.  Mediastinal drain is also noted.  The lung volumes are low.  No definite pneumothorax.  No definite acute consolidative airspace disease.  Patchy linear opacities throughout the mid lungs bilaterally and in the left base are most compatible with subsegmental atelectasis.  Small left pleural effusion.  No evidence of pulmonary edema.  Heart size remains mildly enlarged. The patient is rotated to the left on today's exam, resulting in distortion of the mediastinal contours and reduced diagnostic sensitivity and specificity for mediastinal pathology.  Atherosclerosis in the thoracic aorta.  IMPRESSION: 1.  Postoperative changes and support apparatus, as above. 2.  Low lung volumes with improving aeration throughout the lungs bilaterally, likely represent resolving postoperative atelectasis and resolution of some postoperative edema.   Original Report Authenticated By: Trudie Reed, M.D.     Medications:     Scheduled Medications: . antiseptic oral rinse  15 mL Mouth Rinse QID  . aspirin  324 mg Per Tube Daily  . aspirin  300 mg Rectal Daily  . bisacodyl  10 mg Oral Daily   Or  . bisacodyl  10 mg Rectal Daily  . cefTAZidime (FORTAZ)  IV  1 g Intravenous Q8H  . chlorhexidine  15 mL Mouth Rinse BID  . docusate  200 mg Per Tube Daily  . insulin aspart  0-24 Units Subcutaneous Q4H  . levalbuterol  1.25 mg Nebulization Q6H  . magic mouthwash  5 mL Oral QID  . mupirocin ointment  1  application Topical BID  . sodium chloride  10 mL Intravenous Q12H  . vancomycin  1,000 mg Intravenous Q12H  . Warfarin - Physician Dosing Inpatient   Does not apply q1800    Infusions: . sodium chloride 20 mL/hr at 12/27/12 0400  . sodium chloride 20 mL/hr at 12/25/12 2021  . sodium chloride 250 mL (12/24/12 1200)  . sodium chloride 250 mL (12/25/12 0220)  . sodium chloride 40 mL/hr at 12/27/12 0400  . amiodarone (NEXTERONE PREMIX) 360 mg/200 mL dextrose 30 mg/hr (12/27/12 0400)  . dexmedetomidine 0.6 mcg/kg/hr (12/27/12 0400)  . DOBUTamine Stopped (12/23/12 1100)  . DOPamine 3 mcg/kg/min (12/27/12 0400)  . epinephrine 7 mcg/min (12/27/12 0400)  . fentaNYL infusion INTRAVENOUS 125 mcg/hr (12/27/12 0400)  . furosemide (LASIX) infusion 4 mg/hr (12/27/12 0400)  . heparin 600 Units/hr (12/27/12 0400)  . lactated ringers 10 mL/hr at 12/24/12 1300  . midazolam (VERSED) infusion 4 mg/hr (12/27/12 0400)  . nitroGLYCERIN Stopped (12/18/12 1705)  . norepinephrine (LEVOPHED) Adult infusion 10 mcg/min (12/27/12 0400)  . pantoprozole (PROTONIX) infusion 8 mg/hr (12/27/12 0400)  . phenylephrine (NEO-SYNEPHRINE) Adult infusion Stopped (12/24/12 0000)    PRN Medications: acetaminophen (TYLENOL) oral liquid 160 mg/5 mL, diphenhydrAMINE, fentaNYL, midazolam, morphine injection, ondansetron (ZOFRAN) IV, ondansetron (ZOFRAN) IV, potassium chloride, sodium chloride, sodium chloride   Assessment:   1. Acute on chronic systolic HF  with biventricular HF EF 10%  --S/p HM II VAD with TV repair 12/18/12  --s/p RVAD. - explanted 8/25 2. 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 Cardiogenic shock  4. VT s/p Biotronik ICD on amio 5. Chronic renal failure, stage III 6. 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 ABL anemia  8. PNA 9. Severe protein calorie malnutrition - on TPN 10. Hyponatremia 11. GI bleeding with Clayborne Artist tear  Plan/Discussion:    RV stable off  Centrimag so far.  Continue to try and wean epi down and also diurese as tolerated - can increase lasix to 8/hr. Continue abx. Anticoagulating carefully in setting of MW tear. Back to OR today for wash out of wound vac and possible closure. D/w Dr. Donata Clay  The patient is critically ill with multiple organ systems failure and requires high complexity decision making for assessment and support, frequent evaluation and titration of therapies, application of advanced monitoring technologies and extensive interpretation of multiple databases.   Critical Care Time devoted to patient care services described in this note is 35 Minutes.  Daniel Bensimhon,MD 7:59 AM   Length of Stay: 19 VAD Team Pager [phone] (7am - 7am) VAD Issue  Non-VAD issues HF Pager [phone]

## 2012-12-27 NOTE — Progress Notes (Signed)
NUTRITION FOLLOW-UP  DOCUMENTATION CODES Per approved criteria  -Not Applicable   INTERVENTION: Once appropriate per team, recommend initiation of elemental formula, Vital 1.5 at 10 mL/hr, via post-pyloric tube. If patient has positive tolerance, recommend advancement of Vital 1.5 by 10 ml q 4 hours, to goal of 40 ml/hr. Add 60 ml Prostat liquid protein BID. Goal regimen will provide: 1840 kcal, 125 grams protein, 733 ml free water. If unable to initiate enteral nutrition, recommend resumption of TPN. RD to continue to follow nutrition care plan.  NUTRITION DIAGNOSIS: Inadequate oral intake now related to inability to eat as evidenced by NPO status. Ongoing.  Goal: Intake to meet >90% of estimated nutrition needs. Unmet.  Monitor:  Vent settings, labs, weight trend, post-op healing and recovery, tolerance of nutrition support  ASSESSMENT: LVAD inserted on 8/18. Pt found to have R ventricular dysfunction s/p LVAD implantation, required trip to OR later that evening for placement of RVAD. RVAD removed 8/25.  Underwent endoscopy on 8/26 - pt found to have Mallory Weiss tear, which was clipped by GI. Currently not receiving any nutrition support and is in the OR undergoing sternotomy incision closure and wound VAC wash out. Discussed plan for nutrition with RN, who states that plan is to place a post-pyloric feeding tube later today.  Remains intubated on ventilator support.  MV: 8.1 L/min Temp:Temp (24hrs), Avg:100.3 F (37.9 C), Min:99.1 F (37.3 C), Max:100.9 F (38.3 C)  Propofol: none  Potassium now WNL.   Height: Ht Readings from Last 1 Encounters:  12/22/12 5\' 9"  (1.753 m)    Weight: Wt Readings from Last 1 Encounters:  12/27/12 196 lb 10.4 oz (89.2 kg)  Admission wt: 165 lb Current wt is up 31 lb.  BMI:  Body mass index is 29.3 kg/(m^2) - using admission weight.  Estimated Nutritional Needs: Kcal: 1740 Protein: 120 - 140 gm Fluid: 1.5 - 2 L  Skin:  Abdomen  incision Chest incision - with wound VAC  Diet Order: NPO  EDUCATION NEEDS: -Education needs addressed. Provided many handouts on heart healthy eating, including label reading tips per patient request on 8/15.   Intake/Output Summary (Last 24 hours) at 12/27/12 1009 Last data filed at 12/27/12 0915  Gross per 24 hour  Intake 4746.08 ml  Output   4880 ml  Net -133.92 ml    Last BM: 8/15  Labs:   Recent Labs Lab 12/24/12 0500  12/25/12 0339  12/26/12 0355 12/26/12 1500 12/27/12 0450  NA 130*  < > 131*  < > 130* 131* 129*  K 4.1  < > 3.8  < > 5.2* 4.3 3.5  CL 94*  < > 97  < > 98 101 97  CO2 27  < > 31  < > 23 23 24   BUN 13  < > 17  < > 24* 22 21  CREATININE <0.20*  < > 0.72  < > 0.84 0.76 0.81  CALCIUM 8.1*  < > 8.1*  < > 8.5 7.8* 8.3*  MG 1.8  --  2.1  --  1.8  --   --   PHOS 1.4*  --  1.9*  --  2.9  --   --   GLUCOSE 301*  < > 142*  < > 142* 139* 137*  < > = values in this interval not displayed.  CBG (last 3)   Recent Labs  12/26/12 2334 12/27/12 0410 12/27/12 0744  GLUCAP 133* 95 127*   Prealbumin  Date/Time Value Range Status  12/25/2012  3:39 AM 5.9* 17.0 - 34.0 mg/dL Final     Performed at [facility]   Triglycerides  Date/Time Value Range Status  12/25/2012  3:39 AM 159* <150 mg/dL Final    Scheduled Meds: . amiodarone (NEXTERONE PREMIX) 360 mg/200 mL dextrose  30 mg/hr Intravenous To OR  . antiseptic oral rinse  15 mL Mouth Rinse QID  . aspirin  324 mg Per Tube Daily  . aspirin  300 mg Rectal Daily  . bisacodyl  10 mg Oral Daily   Or  . bisacodyl  10 mg Rectal Daily  . cefTAZidime (FORTAZ)  IV  1 g Intravenous Q8H  . chlorhexidine  15 mL Mouth Rinse BID  . dexmedetomidine  0.4-1.2 mcg/kg/hr Intravenous To OR  . DOBUTamine  2.5-20 mcg/kg/min Intravenous To OR  . docusate  200 mg Per Tube Daily  . DOPamine  3-10 mcg/kg/min Intravenous To OR  . insulin aspart  0-24 Units Subcutaneous Q4H  . levalbuterol  1.25 mg Nebulization Q6H  .  magic mouthwash  5 mL Oral QID  . mupirocin ointment  1 application Topical BID  . pantoprozole (PROTONIX) infusion  8 mg/hr Intravenous To OR  . sodium chloride  10 mL Intravenous Q12H  . vancomycin  1,000 mg Intravenous Q12H  . vancomycin  2,000 mg Other To OR  . Warfarin - Physician Dosing Inpatient   Does not apply q1800    Continuous Infusions: . sodium chloride 50 mL/hr (12/27/12 0838)  . sodium chloride 40 mL/hr at 12/27/12 0838  . sodium chloride 250 mL (12/24/12 1200)  . sodium chloride 250 mL (12/25/12 0220)  . sodium chloride 40 mL/hr at 12/27/12 0400  . dexmedetomidine 0.6 mcg/kg/hr (12/27/12 1610)  . DOBUTamine Stopped (12/23/12 1100)  . DOPamine 4 mcg/kg/min (12/27/12 0935)  . epinephrine 8 mcg/min (12/27/12 0942)  . fentaNYL infusion INTRAVENOUS 125 mcg/hr (12/27/12 0838)  . furosemide (LASIX) infusion 4 mg/hr (12/27/12 0838)  . heparin 600 Units/hr (12/27/12 9604)  . lactated ringers 10 mL/hr at 12/24/12 1300  . midazolam (VERSED) infusion 4 mg/hr (12/27/12 0838)  . nitroGLYCERIN Stopped (12/18/12 1705)  . norepinephrine (LEVOPHED) Adult infusion 8 mcg/min (12/27/12 0950)  . pantoprozole (PROTONIX) infusion 8 mg/hr (12/27/12 0838)  . phenylephrine (NEO-SYNEPHRINE) Adult infusion Stopped (12/24/12 0000)    Jarold Motto MS, RD, LDN Pager: [phone] After-hours pager: [phone]

## 2012-12-27 NOTE — Progress Notes (Signed)
Vent changes ordered by Dr. Alla German

## 2012-12-27 NOTE — Progress Notes (Signed)
The patient was examined and preop studies reviewed. There has been no change from the prior exam and the patient is ready for surgery. Plan wound VAC change and possible sternal closure today

## 2012-12-27 NOTE — Progress Notes (Signed)
Kaitlin [name] Daily Rounding Note 12/27/2012, 8:43 AM  SUBJECTIVE:       No melenic stools per RN.  Gone to surgery for sternal wound closure  OBJECTIVE:         Vital signs in last 24 hours:    Temp:  [99.1 F (37.3 C)-100.9 F (38.3 C)] 100.6 F (38.1 C) (08/27 0800) Pulse Rate:  [25-97] 95 (08/27 0800) Resp:  [11-23] 17 (08/27 0800) BP: (78-85)/(70-74) 85/74 mmHg (08/26 1513) SpO2:  [56 %-100 %] 100 % (08/27 0800) FiO2 (%):  [50 %-100 %] 100 % (08/27 0824) Weight:  [89.2 kg (196 lb 10.4 oz)] 89.2 kg (196 lb 10.4 oz) (08/27 0500) Last BM Date: 12/15/12 General:       Unable to examine as she has gone to surgery Heart:  Chest:  Abdomen:   Extremities:  Neuro/Psych:    Intake/Output from previous day: 08/26 0701 - 08/27 0700 In: 5463.6 [I.V.:4343.6; Blood:350; NG/GT:220; IV Piggyback:550] Out: 4760 [Urine:3680; Emesis/NG output:400; Drains:400; Chest Tube:280]  Intake/Output this shift: Total I/O In: 6 [I.V.:6] Out: -   Lab Results:  Recent Labs  12/26/12 0355 12/26/12 1500 12/27/12 0450  WBC 23.1* 23.9* 24.1*  HGB 9.0* 7.9* 9.9*  HCT 25.2* 22.5* 28.2*  PLT 262 275 297   BMET  Recent Labs  12/26/12 0355 12/26/12 1500 12/27/12 0450  NA 130* 131* 129*  K 5.2* 4.3 3.5  CL 98 101 97  CO2 23 23 24   GLUCOSE 142* 139* 137*  BUN 24* 22 21  CREATININE 0.84 0.76 0.81  CALCIUM 8.5 7.8* 8.3*   LFT  Recent Labs  12/25/12 0339 12/26/12 0355 12/27/12 0450  PROT 4.3* 4.8* 4.7*  ALBUMIN 1.9* 2.8* 2.2*  AST 34 26 23  ALT 18 11 11   ALKPHOS 78 78 120*  BILITOT 1.1 2.0* 1.5*   PT/INR  Recent Labs  12/26/12 0355  LABPROT 15.4*  INR 1.25    Studies/Results: Dg Chest Port 1 View 12/27/2012     Findings: Endotracheal tube is in grossly good position and unchanged compared to prior exam.  Left internal jugular Swan-Ganz catheter is noted with tip directed toward right pulmonary artery. Right subclavian venous catheter is unchanged in position.  Left-  sided defibrillator is unchanged in position.  Bilateral chest tubes are unchanged in position without evidence of pneumothorax. Left basilar airspace opacity remains with possible associated pleural effusion.  Ventricular assistance device is again noted over the left chest and upper abdomen.  IMPRESSION: No pneumothorax is seen.  Stable left basilar airspace opacity compared to prior exam.  Stable support apparatus.   Original Report Authenticated By: Lupita Raider.,  M.D.    ASSESMENT: * Upper [name] bleed.   EGD 8/26: Tear at gastric cardia: epi injected and 2 endoclips placed.  Restarted Heparin 8/26.  * ABL anemia: Post op losses, [name] bleed are sources. Hgb improved.  S/p at least 17 units PRBCs since surgeries. * Sp LVAD, tricuspid Valve repair 8/18  * Sp emergent temporary RVAD placement 8/19  RVAD removed 8/25, sternal wound left open with plans to close today 8/27  * Evolving left lung pneumonia. On Vanc  * Protein malnutrition. Low prealbumin.  On TPN  * Hyperglycemia. No hx of DM   PLAN: *  Continue to observe for signs of worsening anemia and recurrent UGI bleeding.    LOS: 19 days   Kaitlin [name]  12/27/2012, 8:43 AM Pager: [phone]  I have personally taken an  interval history, reviewed the chart, and examined the patient.  I agree with the extender's note, impression and recommendations.  Barbette Hair. Arlyce Dice, MD, [facility] [phone]

## 2012-12-27 NOTE — Consult Note (Signed)
WOC follow-up: Dr Morton Peters took pt to OR this AM for sternal wound closure and she no longer has a vac dressing according to bedside nurse. Please re-consult if further assistance is needed.  Thank-you,  Cammie Mcgee MSN, RN, CWOCN, [place], CNS [phone]

## 2012-12-27 NOTE — Transfer of Care (Signed)
Immediate Anesthesia Transfer of Care Note  Patient: Kaitlin [name]  Procedure(s) Performed: Procedure(s): STERNAL CLOSURE (N/A) MEDIASTINAL EXPLORATION (N/A)  Patient Location: SICU  Anesthesia Type:General  Level of Consciousness: Patient remains intubated per anesthesia plan  Airway & Oxygen Therapy: Patient remains intubated per anesthesia plan and Patient placed on Ventilator (see vital sign flow sheet for setting)  Post-op Assessment: Post -op Vital signs reviewed and stable  Post vital signs: Reviewed and stable  Complications: No apparent anesthesia complications

## 2012-12-27 NOTE — Progress Notes (Signed)
Patient ID: [name], female   DOB: 15-Nov-1945, 67 y.o.   MRN: [id number]  SICU Evening Rounds:  MAP 70's, CVP 22, PAP 38/23, CI 2.4 on dop 4, dobut 4, epi 8, levo 7, NO 35ppm.  Chest closed today.  PI 5.6  COOx 50 this pm.  55 when back from the OR.  Urine output good on lasix drip  BMET    Component Value Date/Time   NA 128* 12/27/2012 1230   K 3.5 12/27/2012 1230   CL 96 12/27/2012 1230   CO2 22 12/27/2012 1230   GLUCOSE 143* 12/27/2012 1230   BUN 19 12/27/2012 1230   CREATININE 0.74 12/27/2012 1230   CALCIUM 9.1 12/27/2012 1230   GFRNONAA 87* 12/27/2012 1230   GFRAA >90 12/27/2012 1230    CBC    Component Value Date/Time   WBC 25.2* 12/27/2012 1700   RBC 3.03* 12/27/2012 1700   HGB 9.2* 12/27/2012 1700   HCT 25.6* 12/27/2012 1700   PLT 282 12/27/2012 1700   MCV 84.5 12/27/2012 1700   MCH 30.4 12/27/2012 1700   MCHC 35.9 12/27/2012 1700   RDW 15.8* 12/27/2012 1700   LYMPHSABS 1.9 12/25/2012 0339   MONOABS 1.9* 12/25/2012 0339   EOSABS 0.4 12/25/2012 0339   BASOSABS 0.0 12/25/2012 0339    Lasix drip increased this pm from 4 to 6 due to elevated CVP.  Heparin at 700 units/hr. PTT pending.

## 2012-12-27 NOTE — Brief Op Note (Signed)
12/08/2012 - 12/27/2012  11:41 AM  PATIENT:  Kaitlin [name]  67 y.o. female  PRE-OPERATIVE DIAGNOSIS:  ICM,VAD  POST-OPERATIVE DIAGNOSIS:  Idiopathic cardiomyopathy VAD PROCEDURE:  Procedure(s): STERNAL CLOSURE (N/A) MEDIASTINAL EXPLORATION (N/A)  SURGEON:  Surgeon(s) and Role:    * Kerin Perna, MD - Primary  PHYSICIAN ASSISTANT: 0  ASSISTANTS: TOW  RNFA   ANESTHESIA:   general  EBL:  Total I/O In: 452.9 [I.V.:152.9; IV Piggyback:300] Out: 750 [Urine:750]  BLOOD ADMINISTERED:none  DRAINS: none   LOCAL MEDICATIONS USED:  NONE  SPECIMEN:  No Specimen  DISPOSITION OF SPECIMEN:  N/A  COUNTS:  YES  TOURNIQUET:  * No tourniquets in log *  DICTATION: .Dragon Dictation  PLAN OF CARE: Admit to inpatient   PATIENT DISPOSITION:  ICU - intubated and hemodynamically stable.   Delay start of Pharmacological VTE agent (>24hrs) due to surgical blood loss or risk of bleeding: yes

## 2012-12-27 NOTE — Anesthesia Preprocedure Evaluation (Signed)
Anesthesia Evaluation  Patient identified by MRN, date of birth, ID bandGeneral Assessment Comment:Patient sedated.  Reviewed: Allergy & Precautions, H&P , NPO status , Patient's Chart, lab work & pertinent test results  History of Anesthesia Complications Negative for: history of anesthetic complications  Airway      Comment: 8.0 ETT in situ Dental  (+) Edentulous Upper and Edentulous Lower   Pulmonary          Cardiovascular hypertension, Pt. on medications and Pt. on home beta blockers +CHF + dysrhythmias (Biotronik AICD placement 2009, s/p SVT ablation 07/15/12 Dr. Ladona Ridgel), now on Amiodarone) Atrial Fibrillation and Ventricular Tachycardia  Status post implantable heartmate LVAD (12/18/12) with subsequent severe RV dysfunction requiring centri-mag right ventricular assist device. Tricuspid ring placed with LVAD insertion.    Neuro/Psych    GI/Hepatic   Endo/Other    Renal/GU Renal InsufficiencyRenal diseaseCreatinine now 0.7, on IV lasix     Musculoskeletal   Abdominal   Peds  Hematology Last Hgb 7.9, receiving PRBCs in SICU. Heparin stopped 0600 due to bleeding from NG tube - last ACT 150, PTT 100   Anesthesia Other Findings   Reproductive/Obstetrics                           Anesthesia Physical  Anesthesia Plan  ASA: IV  Anesthesia Plan: General   Post-op Pain Management:    Induction: Intravenous and Inhalational  Airway Management Planned: Oral ETT  Additional Equipment:   Intra-op Plan:   Post-operative Plan: Post-operative intubation/ventilation  Informed Consent:   Plan Discussed with: Surgeon, CRNA and Anesthesiologist  Anesthesia Plan Comments:         Anesthesia Quick Evaluation

## 2012-12-28 ENCOUNTER — Inpatient Hospital Stay (HOSPITAL_COMMUNITY): Payer: Commercial Managed Care - PPO

## 2012-12-28 DIAGNOSIS — Z95818 Presence of other cardiac implants and grafts: Secondary | ICD-10-CM

## 2012-12-28 DIAGNOSIS — I5022 Chronic systolic (congestive) heart failure: Secondary | ICD-10-CM

## 2012-12-28 DIAGNOSIS — I428 Other cardiomyopathies: Secondary | ICD-10-CM

## 2012-12-28 LAB — CBC
HCT: 25.9 % — ABNORMAL LOW (ref 36.0–46.0)
HCT: 25.4 % — ABNORMAL LOW (ref 36.0–46.0)
Hemoglobin: 9 g/dL — ABNORMAL LOW (ref 12.0–15.0)
Hemoglobin: 8.9 g/dL — ABNORMAL LOW (ref 12.0–15.0)
MCH: 29.4 pg (ref 26.0–34.0)
MCH: 29.8 pg (ref 26.0–34.0)
MCHC: 34.7 g/dL (ref 30.0–36.0)
MCHC: 35 g/dL (ref 30.0–36.0)
MCV: 84.6 fL (ref 78.0–100.0)
MCV: 84.9 fL (ref 78.0–100.0)
Platelets: 318 10*3/uL (ref 150–400)
Platelets: 342 10*3/uL (ref 150–400)
RBC: 3.06 MIL/uL — ABNORMAL LOW (ref 3.87–5.11)
RBC: 2.99 MIL/uL — ABNORMAL LOW (ref 3.87–5.11)
RDW: 16.2 % — ABNORMAL HIGH (ref 11.5–15.5)
RDW: 16.3 % — ABNORMAL HIGH (ref 11.5–15.5)
WBC: 31.3 10*3/uL — ABNORMAL HIGH (ref 4.0–10.5)
WBC: 26.2 10*3/uL — ABNORMAL HIGH (ref 4.0–10.5)

## 2012-12-28 LAB — BLOOD GAS, ARTERIAL
Acid-base deficit: 1.7 mmol/L (ref 0.0–2.0)
Bicarbonate: 21.7 mEq/L (ref 20.0–24.0)
Drawn by: 252031
FIO2: 0.5 %
MECHVT: 450 mL
O2 Saturation: 98.6 %
PEEP: 5 cmH2O
Patient temperature: 98.6
RATE: 12 resp/min
TCO2: 22.6 mmol/L (ref 0–100)
pCO2 arterial: 31.4 mmHg — ABNORMAL LOW (ref 35.0–45.0)
pH, Arterial: 7.454 — ABNORMAL HIGH (ref 7.350–7.450)
pO2, Arterial: 108 mmHg — ABNORMAL HIGH (ref 80.0–100.0)

## 2012-12-28 LAB — COMPREHENSIVE METABOLIC PANEL
ALT: 13 U/L (ref 0–35)
AST: 26 U/L (ref 0–37)
Albumin: 2.5 g/dL — ABNORMAL LOW (ref 3.5–5.2)
Alkaline Phosphatase: 145 U/L — ABNORMAL HIGH (ref 39–117)
BUN: 20 mg/dL (ref 6–23)
CO2: 21 mEq/L (ref 19–32)
Calcium: 8.4 mg/dL (ref 8.4–10.5)
Chloride: 96 mEq/L (ref 96–112)
Creatinine, Ser: 0.81 mg/dL (ref 0.50–1.10)
GFR calc Af Amer: 86 mL/min — ABNORMAL LOW (ref >90)
GFR calc non Af Amer: 75 mL/min — ABNORMAL LOW (ref >90)
Glucose, Bld: 144 mg/dL — ABNORMAL HIGH (ref 70–99)
Potassium: 3.6 mEq/L (ref 3.5–5.1)
Sodium: 129 mEq/L — ABNORMAL LOW (ref 135–145)
Total Bilirubin: 2 mg/dL — ABNORMAL HIGH (ref 0.3–1.2)
Total Protein: 5 g/dL — ABNORMAL LOW (ref 6.0–8.3)

## 2012-12-28 LAB — CARBOXYHEMOGLOBIN
Carboxyhemoglobin: 1.6 % — ABNORMAL HIGH (ref 0.5–1.5)
Carboxyhemoglobin: 1.3 % (ref 0.5–1.5)
Methemoglobin: 1.8 % — ABNORMAL HIGH (ref 0.0–1.5)
Methemoglobin: 2.1 % — ABNORMAL HIGH (ref 0.0–1.5)
O2 Saturation: 52 %
O2 Saturation: 54.1 %
Total hemoglobin: 8.7 g/dL — ABNORMAL LOW (ref 12.0–16.0)
Total hemoglobin: 10.6 g/dL — ABNORMAL LOW (ref 12.0–16.0)

## 2012-12-28 LAB — APTT
aPTT: 59 seconds — ABNORMAL HIGH (ref 24–37)
aPTT: 39 seconds — ABNORMAL HIGH (ref 24–37)
aPTT: 54 seconds — ABNORMAL HIGH (ref 24–37)

## 2012-12-28 LAB — BASIC METABOLIC PANEL
BUN: 21 mg/dL (ref 6–23)
CO2: 19 mEq/L (ref 19–32)
Calcium: 8.2 mg/dL — ABNORMAL LOW (ref 8.4–10.5)
Chloride: 100 mEq/L (ref 96–112)
Creatinine, Ser: 0.77 mg/dL (ref 0.50–1.10)
GFR calc Af Amer: >90 mL/min (ref >90)
GFR calc non Af Amer: 86 mL/min — ABNORMAL LOW (ref >90)
Glucose, Bld: 152 mg/dL — ABNORMAL HIGH (ref 70–99)
Potassium: 3 mEq/L — ABNORMAL LOW (ref 3.5–5.1)
Sodium: 131 mEq/L — ABNORMAL LOW (ref 135–145)

## 2012-12-28 LAB — POCT I-STAT, CHEM 8
BUN: 19 mg/dL (ref 6–23)
BUN: 20 mg/dL (ref 6–23)
Chloride: 101 mEq/L (ref 96–112)
Chloride: 102 mEq/L (ref 96–112)
Creatinine, Ser: 0.8 mg/dL (ref 0.50–1.10)
HCT: 44 % (ref 36.0–46.0)
Potassium: 2.9 mEq/L — ABNORMAL LOW (ref 3.5–5.1)
Potassium: 2.9 mEq/L — ABNORMAL LOW (ref 3.5–5.1)
Sodium: 132 mEq/L — ABNORMAL LOW (ref 135–145)

## 2012-12-28 LAB — LACTATE DEHYDROGENASE: LDH: 400 U/L — ABNORMAL HIGH (ref 94–250)

## 2012-12-28 LAB — POCT I-STAT 3, ART BLOOD GAS (G3+)
Bicarbonate: 20.5 mEq/L (ref 20.0–24.0)
pCO2 arterial: 33.4 mmHg — ABNORMAL LOW (ref 35.0–45.0)
pO2, Arterial: 134 mmHg — ABNORMAL HIGH (ref 80.0–100.0)

## 2012-12-28 LAB — GLUCOSE, CAPILLARY
Glucose-Capillary: 140 mg/dL — ABNORMAL HIGH (ref 70–99)
Glucose-Capillary: 137 mg/dL — ABNORMAL HIGH (ref 70–99)
Glucose-Capillary: 105 mg/dL — ABNORMAL HIGH (ref 70–99)
Glucose-Capillary: 137 mg/dL — ABNORMAL HIGH (ref 70–99)
Glucose-Capillary: 135 mg/dL — ABNORMAL HIGH (ref 70–99)

## 2012-12-28 LAB — VANCOMYCIN, TROUGH: Vancomycin Tr: 21.3 ug/mL — ABNORMAL HIGH (ref 10.0–20.0)

## 2012-12-28 LAB — PROCALCITONIN: Procalcitonin: 1.33 ng/mL

## 2012-12-28 MED ORDER — PRO-STAT SUGAR FREE PO LIQD
60.0000 mL | Freq: Two times a day (BID) | ORAL | Status: DC
  Administered 2012-12-28 – 2013-01-03 (×14): 60 mL via ORAL
  Filled 2012-12-28 (×17): qty 60

## 2012-12-28 MED ORDER — VANCOMYCIN HCL 10 G IV SOLR
1500.0000 mg | INTRAVENOUS | Status: DC
  Administered 2012-12-28 – 2012-12-31 (×4): 1500 mg via INTRAVENOUS
  Filled 2012-12-28 (×6): qty 1500

## 2012-12-28 MED ORDER — VANCOMYCIN HCL IN DEXTROSE 750-5 MG/150ML-% IV SOLN
750.0000 mg | Freq: Two times a day (BID) | INTRAVENOUS | Status: DC
  Filled 2012-12-28 (×2): qty 150

## 2012-12-28 MED ORDER — IOHEXOL 300 MG/ML  SOLN
50.0000 mL | Freq: Once | INTRAMUSCULAR | Status: AC | PRN
  Administered 2012-12-28: 30 mL

## 2012-12-28 MED ORDER — HEPARIN (PORCINE) IN NACL 100-0.45 UNIT/ML-% IJ SOLN: 900.0000 [IU]/h | INTRAMUSCULAR | Status: DC

## 2012-12-28 MED ORDER — FLUCONAZOLE 100MG IVPB
100.0000 mg | Freq: Every day | INTRAVENOUS | Status: AC
  Administered 2012-12-28 – 2012-12-30 (×3): 100 mg via INTRAVENOUS
  Filled 2012-12-28 (×3): qty 50

## 2012-12-28 MED ORDER — SODIUM CHLORIDE 0.9 % IV SOLN
0.1000 mg/kg/h | INTRAVENOUS | Status: DC
  Administered 2012-12-28: 0.1 mg/kg/h via INTRAVENOUS
  Administered 2012-12-29 – 2012-12-31 (×3): 0.12 mg/kg/h via INTRAVENOUS
  Filled 2012-12-28 (×4): qty 250

## 2012-12-28 MED ORDER — VITAL 1.5 CAL PO LIQD
40.0000 mL/h | ORAL | Status: DC
  Administered 2012-12-28: 10 mL/h
  Administered 2012-12-29 – 2013-01-04 (×6): 40 mL/h
  Filled 2012-12-28 (×11): qty 1000

## 2012-12-28 MED ORDER — PIPERACILLIN-TAZOBACTAM 3.375 G IVPB: 3.3750 g | Freq: Four times a day (QID) | INTRAVENOUS | Status: DC

## 2012-12-28 MED ORDER — SODIUM CHLORIDE 0.9 % IV SOLN
500.0000 mg | Freq: Four times a day (QID) | INTRAVENOUS | Status: DC
  Administered 2012-12-28 – 2013-01-03 (×25): 500 mg via INTRAVENOUS
  Filled 2012-12-28 (×26): qty 500

## 2012-12-28 MED ORDER — POTASSIUM CHLORIDE 10 MEQ/50ML IV SOLN
10.0000 meq | INTRAVENOUS | Status: AC
  Administered 2012-12-28 (×3): 10 meq via INTRAVENOUS
  Filled 2012-12-28: qty 50

## 2012-12-28 MED ORDER — SODIUM CHLORIDE 0.9 % IV SOLN
0.2500 mg/kg/h | INTRAVENOUS | Status: DC
  Filled 2012-12-28 (×2): qty 250

## 2012-12-28 MED ORDER — BIVALIRUDIN 250 MG IV SOLR
0.1000 mg/kg/h | INTRAVENOUS | Status: DC
  Administered 2012-12-28: 0.1 mg/kg/h via INTRAVENOUS
  Filled 2012-12-28: qty 250

## 2012-12-28 MED ORDER — POTASSIUM CHLORIDE 10 MEQ/50ML IV SOLN
10.0000 meq | Freq: Once | INTRAVENOUS | Status: AC
  Administered 2012-12-28: 10 meq via INTRAVENOUS

## 2012-12-28 MED ORDER — WARFARIN SODIUM 5 MG PO TABS
5.0000 mg | ORAL_TABLET | Freq: Every day | ORAL | Status: DC
  Administered 2012-12-28 – 2012-12-29 (×2): 5 mg via ORAL
  Filled 2012-12-28 (×2): qty 1

## 2012-12-28 MED ORDER — EPINEPHRINE HCL 1 MG/ML IJ SOLN
0.5000 ug/min | INTRAVENOUS | Status: DC
  Administered 2012-12-28: 5 ug/min via INTRAVENOUS
  Administered 2012-12-30: 4 ug/min via INTRAVENOUS
  Filled 2012-12-28 (×3): qty 4

## 2012-12-28 MED ORDER — POTASSIUM CHLORIDE 10 MEQ/50ML IV SOLN
10.0000 meq | INTRAVENOUS | Status: AC
  Administered 2012-12-28 (×2): 10 meq via INTRAVENOUS
  Filled 2012-12-28: qty 100

## 2012-12-28 MED ORDER — POTASSIUM CHLORIDE 10 MEQ/50ML IV SOLN
10.0000 meq | INTRAVENOUS | Status: AC
  Administered 2012-12-28 (×3): 10 meq via INTRAVENOUS

## 2012-12-28 NOTE — Progress Notes (Signed)
. HeartMate 2 Rounding Note  Subjective:   Postop day #10 HeartMate 2 implantation with subsequent emergency centri- mag RVAD because of  RV dysfunction. Patient with stable hemodynamics on BiVAD support.Returned to OR 8-25 for successful removal of RVAD Responsive but comfortable with IV sedation.  Had successful delayed sternal closure yesterday- stable overnight  UGI bleed from M-W tear- now inactive, on iv protonix  Patient showing heparin resistance so will start bival for VAD anticoagulation until coumadin effect is therapeutic Follow PTT for bival dose, goal 60-80 sec  Feeding tube post-pyloric and Vital 1.5 started Coumadin started VT  Needs diuresis for fluid overload- lasix drip  WBC >30k with procalcitonin 1.3, on Vanc, Primaxin    LVAD INTERROGATION:  HeartMate II LVAD:  Flow 3.1 liters/min, speed 8400 power 3.2, PI 4.8.   Objective:    Vital Signs:   Temp:  [99.3 F (37.4 C)-101.8 F (38.8 C)] 100.6 F (38.1 C) (08/28 1200) Pulse Rate:  [27-103] 102 (08/28 1230) Resp:  [20-26] 23 (08/28 1230) BP: (77-93)/(70-77) 77/70 mmHg (08/28 1230) SpO2:  [87 %-100 %] 100 % (08/28 1230) FiO2 (%):  [50 %] 50 % (08/28 1230) Weight:  [203 lb 4.2 oz (92.2 kg)] 203 lb 4.2 oz (92.2 kg) (08/28 0500) Last BM Date: 12/15/12 Mean arterial Pressure 84  Intake/Output:   Intake/Output Summary (Last 24 hours) at 12/28/12 1312 Last data filed at 12/28/12 1200  Gross per 24 hour  Intake 5168.13 ml  Output   3500 ml  Net 1668.13 ml     Physical Exam: General: Intubated in ICU on right ventricular assist device, sedated and comfortable HEENT: normal Neck: supple. JVP normal  No lymphadenopathy or thryomegaly appreciated. Cor: Mechanical heart sounds with LVAD hum present. Lungs: clear Abdomen: soft, nontender, nondistended. No hepatosplenomegaly. No bruits or masses. Good bowel sounds. Extremities: no cyanosis, clubbing, rash, edema extremities warm Neuro: moves all 4  extremities but now is on sedation protocol while on ventilator  Telemetry: AV paced 90 per minute  Labs: Basic Metabolic Panel:  Recent Labs Lab 12/22/12 0432  12/23/12 0402  12/24/12 0500  12/25/12 0339  12/26/12 0355 12/26/12 1500 12/27/12 0450 12/27/12 0906 12/27/12 1230 12/28/12 0428  NA 133*  < > 133*  < > 130*  < > 131*  < > 130* 131* 129* 131* 128* 129*  K 3.4*  < > 3.7  < > 4.1  < > 3.8  < > 5.2* 4.3 3.5 4.0 3.5 3.6  CL 97  < > 98  < > 94*  < > 97  < > 98 101 97  --  96 96  CO2 28  < > 29  < > 27  < > 31  < > 23 23 24   --  22 21  GLUCOSE 115*  < > 126*  < > 301*  < > 142*  < > 142* 139* 137* 138* 143* 144*  BUN 18  < > 22  < > 13  < > 17  < > 24* 22 21  --  19 20  CREATININE 0.93  < > 0.76  < > <0.20*  < > 0.72  < > 0.84 0.76 0.81  --  0.74 0.81  CALCIUM 8.3*  < > 8.2*  < > 8.1*  < > 8.1*  < > 8.5 7.8* 8.3*  --  9.1 8.4  MG 1.9  --  2.3  --  1.8  --  2.1  --  1.8  --   --   --   --   --  PHOS 2.5  --  2.1*  --  1.4*  --  1.9*  --  2.9  --   --   --   --   --   < > = values in this interval not displayed.  Liver Function Tests:  Recent Labs Lab 12/24/12 0500 12/25/12 0339 12/26/12 0355 12/27/12 0450 12/28/12 0428  AST 28 34 26 23 26   ALT 15 18 11 11 13   ALKPHOS 80 78 78 120* 145*  BILITOT 1.2 1.1 2.0* 1.5* 2.0*  PROT 4.3* 4.3* 4.8* 4.7* 5.0*  ALBUMIN 1.9* 1.9* 2.8* 2.2* 2.5*   No results found for this basename: LIPASE, AMYLASE,  in the last 168 hours No results found for this basename: AMMONIA,  in the last 168 hours  CBC:  Recent Labs Lab 12/24/12 0500  12/25/12 0339  12/26/12 1500 12/27/12 0450 12/27/12 0906 12/27/12 1230 12/27/12 1700 12/28/12 0428  WBC 17.2*  --  19.3*  < > 23.9* 24.1*  --  22.2* 25.2* 31.3*  NEUTROABS 12.9*  --  15.1*  --   --   --   --   --   --   --   HGB 7.5*  < > 7.3*  < > 7.9* 9.9* 10.2* 9.1* 9.2* 9.0*  HCT 22.5*  < > 21.3*  < > 22.5* 28.2* 30.0* 26.1* 25.6* 25.9*  MCV 87.5  --  87.3  < > 85.2 84.2  --  84.5 84.5  84.6  PLT 158  --  178  < > 275 297  --  258 282 318  < > = values in this interval not displayed.  INR:  Recent Labs Lab 12/22/12 0432 12/26/12 0355  INR 1.13 1.25    Other results:  EKG:   Imaging: Dg Abd 1 View  12/28/2012   *RADIOLOGY REPORT*  Clinical Data: Feeding tube placement.  ABDOMEN - 1 VIEW  Comparison: None.  Findings: Feeding tube was placed under fluoroscopy by Vanita Ingles, Radiology Technologist with the tip at the ligament of Treitz.  5 minutes, 26 seconds of fluoroscopy time was used in conjunction with the procedure.  Position of the tip was confirmed by injecting contrast material.  IMPRESSION: Feeding tube tip in good position at the ligament of Treitz.   Original Report Authenticated By: Holley Dexter, M.D.   Dg Chest Port 1 View  12/28/2012   *RADIOLOGY REPORT*  Clinical Data: Left ventricular assistance device.  PORTABLE CHEST - 1 VIEW  Comparison: December 27, 2012.  Findings: Endotracheal tube is in grossly good position with distal tip 13 mm above the carina.  Left internal jugular Swan-Ganz catheter is noted with tip in main pulmonary artery.  Right subclavian catheter line is unchanged.  Left-sided defibrillator is again noted and unchanged.  Left-sided chest tube is unchanged in position with no evidence of pneumothorax.  Right midlung opacity noted on prior exam is decreased consistent with improving pneumonia or subsegmental atelectasis.  Left ventricular assistance device is again noted and unchanged. Left basilar opacity is unchanged compared to prior exam.  IMPRESSION: Stable left basilar opacity.  No pneumothorax seen.  Right midlung opacity is improved compared to prior exam.   Original Report Authenticated By: Lupita Raider.,  M.D.   Dg Chest Port 1 View  12/27/2012   *RADIOLOGY REPORT*  Clinical Data: Postop for ventricular assist device.  PORTABLE CHEST - 1 VIEW  Comparison: 12/27/2012 at 0600 hours  Findings: 1240 hours.  Endotracheal tube  terminates 1.6 cm above  the carina.  Swan-Ganz catheter tip in proximal right pulmonary artery.  There is also a right-sided subclavian line whose tip is difficult to visualize.  Pacer / AICD device unchanged.  Ventricular assist device projects similarly, over the cardiac apex.  2 left chest tubes remain in place, without pneumothorax.  Nasogastric tube extends beyond the  inferior aspect of the film. Cardiomegaly accentuated by AP portable technique.  No definite pleural fluid.  Removal of right chest tube.  Mild right midlung pulmonary opacity surrounds the presumed chest tube tract. Persistent left base patchy airspace disease .  IMPRESSION: Removal of right-sided chest tube.  Presumed atelectasis in the right mid lung, along the presumed tract of the chest tube.  Similar left base airspace disease, likely atelectasis.  Endotracheal tube borderline low in position.  Consider retraction 2 cm versus attention on follow-up radiographs.  Otherwise similar support apparatus, without evidence of pneumothorax.   Original Report Authenticated By: Jeronimo Greaves, M.D.   Dg Chest Port 1 View  12/27/2012   *RADIOLOGY REPORT*  Clinical Data: Ventricular assistance device  PORTABLE CHEST - 1 VIEW  Comparison: December 26, 2012.  Findings: Endotracheal tube is in grossly good position and unchanged compared to prior exam.  Left internal jugular Swan-Ganz catheter is noted with tip directed toward right pulmonary artery. Right subclavian venous catheter is unchanged in position.  Left- sided defibrillator is unchanged in position.  Bilateral chest tubes are unchanged in position without evidence of pneumothorax. Left basilar airspace opacity remains with possible associated pleural effusion.  Ventricular assistance device is again noted over the left chest and upper abdomen.  IMPRESSION: No pneumothorax is seen.  Stable left basilar airspace opacity compared to prior exam.  Stable support apparatus.   Original Report  Authenticated By: Lupita Raider.,  M.D.     Medications:     Scheduled Medications: . antiseptic oral rinse  15 mL Mouth Rinse QID  . aspirin  324 mg Per Tube Daily  . aspirin  300 mg Rectal Daily  . bisacodyl  10 mg Oral Daily   Or  . bisacodyl  10 mg Rectal Daily  . chlorhexidine  15 mL Mouth Rinse BID  . docusate  200 mg Per Tube Daily  . feeding supplement  60 mL Oral BID  . feeding supplement (VITAL 1.5 CAL)  40 mL/hr Per Tube Q24H  . fluconazole (DIFLUCAN) IV  100 mg Intravenous Daily  . imipenem-cilastatin  500 mg Intravenous Q6H  . insulin aspart  0-24 Units Subcutaneous Q4H  . levalbuterol  1.25 mg Nebulization Q6H  . magic mouthwash  5 mL Oral QID  . mupirocin ointment  1 application Topical BID  . sodium chloride  10 mL Intravenous Q12H  . vancomycin  1,000 mg Intravenous Q12H  . Warfarin - Physician Dosing Inpatient   Does not apply q1800    Infusions: . sodium chloride 50 mL/hr (12/27/12 0838)  . sodium chloride 20 mL/hr at 12/28/12 0531  . sodium chloride Stopped (12/28/12 0000)  . sodium chloride 250 mL (12/25/12 0220)  . amiodarone (NEXTERONE PREMIX) 360 mg/200 mL dextrose 30 mg/hr (12/28/12 1130)  . bivalirudin (ANGIOMAX) infusion 5 mg/mL (Cath Lab,ACS,PCI indication)    . dexmedetomidine 0.6 mcg/kg/hr (12/28/12 1157)  . DOBUTamine 4 mcg/kg/min (12/28/12 0800)  . DOPamine 4 mcg/kg/min (12/28/12 0141)  . epinephrine    . fentaNYL infusion INTRAVENOUS 125 mcg/hr (12/28/12 0900)  . furosemide (LASIX) infusion 8 mg/hr (12/28/12 0833)  . lactated ringers Stopped (12/28/12 0000)  .  midazolam (VERSED) infusion 4 mg/hr (12/28/12 1157)  . nitroGLYCERIN Stopped (12/18/12 1705)  . norepinephrine (LEVOPHED) Adult infusion 8 mcg/min (12/28/12 0800)  . pantoprozole (PROTONIX) infusion 8 mg/hr (12/28/12 0135)  . phenylephrine (NEO-SYNEPHRINE) Adult infusion Stopped (12/24/12 0000)    PRN Medications: acetaminophen (TYLENOL) oral liquid 160 mg/5 mL,  diphenhydrAMINE, fentaNYL, midazolam, morphine injection, ondansetron (ZOFRAN) IV, ondansetron (ZOFRAN) IV, potassium chloride, sodium chloride, sodium chloride   Assessment:  1 status post implantable LVAD with subsequent severe RV dysfunction requiring right ventricular assist device- now removed after 1 week support 2 coagulopathy and bleeding requiring transfusion now improved 3 UGI bleeding- M-W tear, treated 4 Chest  Closure successful Plan/Discussion:   Anticoagulation with bival/ coumadin  Support native RV fx  Nutrition w/ TF started  Cont. Vent to deliver nitric oxide I reviewed the LVAD parameters from today, and compared the results to the patient's prior recorded data.  No programming changes were made.  The LVAD is functioning within specified parameters.  The patient performs LVAD self-test daily.  LVAD interrogation was negative for any significant power changes, alarms or PI events/speed drops.  LVAD equipment check completed and is in good working order.  Back-up equipment present.   LVAD education done on emergency procedures and precautions and reviewed exit site care.  Length of Stay: 20  [name] III,PETER 12/28/2012, 1:12 PM

## 2012-12-28 NOTE — Progress Notes (Signed)
NUTRITION FOLLOW-UP  DOCUMENTATION CODES Per approved criteria  -Not Applicable   INTERVENTION: Once appropriate per team, recommend initiation of elemental formula, Vital 1.5 at 10 mL/hr, via post-pyloric tube.  If patient has positive tolerance, recommend advancement of Vital 1.5 by 10 ml q 4 hours, to goal of 40 ml/hr. Add 60 ml Prostat liquid protein BID. Goal regimen will provide: 1840 kcal, 125 grams protein, 733 ml free water. If unable to initiate enteral nutrition, recommend resumption of TPN. RD to continue to follow nutrition care plan.  NUTRITION DIAGNOSIS: Inadequate oral intake now related to inability to eat as evidenced by NPO status. Ongoing.  Goal: Intake to meet >90% of estimated nutrition needs. Unmet.  Monitor:  Vent settings, labs, weight trend, post-op healing and recovery, tolerance of nutrition support  ASSESSMENT: LVAD inserted on 8/18. Pt found to have R ventricular dysfunction s/p LVAD implantation, required trip to OR later that evening for placement of RVAD. RVAD removed 8/25.  Underwent endoscopy on 8/26 - pt found to have Mallory Weiss tear, which was clipped by GI. Currently not receiving any nutrition support, plan to have panda placed this morning by radiology.  Underwent sternotomy incision closure and wound VAC wash out 8/26.   Remains intubated on ventilator support.  MV: 10.6 L/min Temp:Temp (24hrs), Avg:100.3 F (37.9 C), Min:99.3 F (37.4 C), Max:101.8 F (38.8 C)  Propofol: none  Height: Ht Readings from Last 1 Encounters:  12/22/12 5\' 9"  (1.753 m)    Weight: Wt Readings from Last 1 Encounters:  12/28/12 203 lb 4.2 oz (92.2 kg)  Admission wt: 165 lb Current wt is up 38 lb.  BMI:  Body mass index is 29.3 kg/(m^2) - using admission weight.  Estimated Nutritional Needs: Kcal: 1901 Protein: 120 - 140 gm Fluid: 1.5 - 2 L  Skin:  Abdomen incision Chest incision  Diet Order: NPO  EDUCATION NEEDS: -Education needs  addressed. Provided many handouts on heart healthy eating, including label reading tips per patient request on 8/15.   Intake/Output Summary (Last 24 hours) at 12/28/12 0922 Last data filed at 12/28/12 0800  Gross per 24 hour  Intake 4777.83 ml  Output   3235 ml  Net 1542.83 ml    Last BM: 8/15  Labs:   Recent Labs Lab 12/24/12 0500  12/25/12 0339  12/26/12 0355  12/27/12 0450 12/27/12 0906 12/27/12 1230 12/28/12 0428  NA 130*  < > 131*  < > 130*  < > 129* 131* 128* 129*  K 4.1  < > 3.8  < > 5.2*  < > 3.5 4.0 3.5 3.6  CL 94*  < > 97  < > 98  < > 97  --  96 96  CO2 27  < > 31  < > 23  < > 24  --  22 21  BUN 13  < > 17  < > 24*  < > 21  --  19 20  CREATININE <0.20*  < > 0.72  < > 0.84  < > 0.81  --  0.74 0.81  CALCIUM 8.1*  < > 8.1*  < > 8.5  < > 8.3*  --  9.1 8.4  MG 1.8  --  2.1  --  1.8  --   --   --   --   --   PHOS 1.4*  --  1.9*  --  2.9  --   --   --   --   --   GLUCOSE 301*  < >  142*  < > 142*  < > 137* 138* 143* 144*  < > = values in this interval not displayed.  CBG (last 3)   Recent Labs  12/28/12 0025 12/28/12 0421 12/28/12 0747  GLUCAP 135* 140* 137*   Prealbumin  Date/Time Value Range Status  12/25/2012  3:39 AM 5.9* 17.0 - 34.0 mg/dL Final     Performed at [facility]   Triglycerides  Date/Time Value Range Status  12/25/2012  3:39 AM 159* <150 mg/dL Final    Scheduled Meds: . antiseptic oral rinse  15 mL Mouth Rinse QID  . aspirin  324 mg Per Tube Daily  . aspirin  300 mg Rectal Daily  . bisacodyl  10 mg Oral Daily   Or  . bisacodyl  10 mg Rectal Daily  . cefTAZidime (FORTAZ)  IV  1 g Intravenous Q8H  . chlorhexidine  15 mL Mouth Rinse BID  . dexmedetomidine  0.4-1.2 mcg/kg/hr Intravenous To OR  . docusate  200 mg Per Tube Daily  . DOPamine  3-10 mcg/kg/min Intravenous To OR  . insulin aspart  0-24 Units Subcutaneous Q4H  . levalbuterol  1.25 mg Nebulization Q6H  . magic mouthwash  5 mL Oral QID  . mupirocin ointment  1  application Topical BID  . potassium chloride  10 mEq Intravenous Q1 Hr x 3  . sodium chloride  10 mL Intravenous Q12H  . vancomycin  1,000 mg Intravenous Q12H  . Warfarin - Physician Dosing Inpatient   Does not apply q1800    Continuous Infusions: . sodium chloride 50 mL/hr (12/27/12 0838)  . sodium chloride 20 mL/hr at 12/28/12 0531  . sodium chloride Stopped (12/28/12 0000)  . sodium chloride 250 mL (12/25/12 0220)  . amiodarone (NEXTERONE PREMIX) 360 mg/200 mL dextrose 30 mg/hr (12/28/12 0800)  . dexmedetomidine 0.6 mcg/kg/hr (12/28/12 0800)  . DOBUTamine 4 mcg/kg/min (12/28/12 0800)  . DOPamine 4 mcg/kg/min (12/28/12 0141)  . epinephrine 7 mcg/min (12/28/12 0800)  . fentaNYL infusion INTRAVENOUS 125 mcg/hr (12/28/12 0800)  . furosemide (LASIX) infusion 8 mg/hr (12/28/12 0833)  . heparin 900 Units/hr (12/28/12 0800)  . lactated ringers Stopped (12/28/12 0000)  . midazolam (VERSED) infusion 4 mg/hr (12/28/12 0800)  . nitroGLYCERIN Stopped (12/18/12 1705)  . norepinephrine (LEVOPHED) Adult infusion 8 mcg/min (12/28/12 0800)  . pantoprozole (PROTONIX) infusion 8 mg/hr (12/28/12 0135)  . phenylephrine (NEO-SYNEPHRINE) Adult infusion Stopped (12/24/12 0000)    Jarold Motto MS, RD, LDN Pager: [phone] After-hours pager: [phone]

## 2012-12-28 NOTE — Progress Notes (Signed)
No signs for overt GI bleeding.  Hg stable.  Plan for PPI Rx as per PA note.  Signing off.

## 2012-12-28 NOTE — Progress Notes (Signed)
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 EF 15-25% with mild to moderate RV dysfunction  Underwent HM II LVAD implant (under destination criteria) along with TV repair on 8/18.  She was taken back to the OR for RVAD support.   On 8/25 was taken back to OR for RVAD explant.  8/26 EGD which showed MW tear in esophagus. Injected and clipped. No further bleeding.   8/27  Back to OR for chest closure. Doing well overnight. No low flow alarms. Remains on epi 8, levo 8, dobut 4 and dopa. CVP 18-19 range. PAPs in 30s  Co-ox marginal at 52%. CI 2.5 on thermo. WBC 31k. CXR is wet. Weight up to 203 pounds (38 pounds up from baseline)  LVAD INTERROGATION:  HeartMate II LVAD:  Flow --  liters/min, speed 8400, power 4, PI 5.7.  No PI  Objective:    Vital Signs:   Temp:  [99.3 F (37.4 C)-101.8 F (38.8 C)] 100 F (37.8 C) (08/28 0600) Pulse Rate:  [27-114] 103 (08/28 0600) Resp:  [17-27] 21 (08/28 0600) BP: (87)/(74) 87/74 mmHg (08/27 1220) SpO2:  [52 %-100 %] 100 % (08/28 0600) FiO2 (%):  [50 %-100 %] 50 % (08/28 0400) Weight:  [92.2 kg (203 lb 4.2 oz)] 92.2 kg (203 lb 4.2 oz) (08/28 0500) Last BM Date: 12/15/12 Mean arterial Pressure 60-70s  Intake/Output:   Intake/Output Summary (Last 24 hours) at 12/28/12 0628 Last data filed at 12/28/12 0600  Gross per 24 hour  Intake 4719.63 ml  Output   3630 ml  Net 1089.63 ml     Physical Exam: General: Intubated and sedated,  HEENT: normal Neck: supple. CVP 19. Carotids 2+ bilat; no bruits. No lymphadenopathy or thryomegaly appreciated.  Cor: Mechanical heart sounds with LVAD hum present.;Lungs: course Abdomen: soft, nontender, mildly distended. Hypoactive BS Driveline: C/D/I; securement device intact  Extremities: no cyanosis, clubbing, rash, 2-3+ edema. cool Neuro: intubated sedated, cranial nerves grossly intact. moves all 4  extremities w/o difficulty. Affect pleasant  Telemetry: AV paced 80s  Labs: Basic Metabolic Panel:  Recent Labs Lab 12/22/12 0432  12/23/12 0402  12/24/12 0500  12/25/12 0339  12/26/12 0355 12/26/12 1500 12/27/12 0450 12/27/12 0906 12/27/12 1230 12/28/12 0428  NA 133*  < > 133*  < > 130*  < > 131*  < > 130* 131* 129* 131* 128* 129*  K 3.4*  < > 3.7  < > 4.1  < > 3.8  < > 5.2* 4.3 3.5 4.0 3.5 3.6  CL 97  < > 98  < > 94*  < > 97  < > 98 101 97  --  96 96  CO2 28  < > 29  < > 27  < > 31  < > 23 23 24   --  22 21  GLUCOSE 115*  < > 126*  < > 301*  < > 142*  < > 142* 139* 137* 138* 143* 144*  BUN 18  < > 22  < > 13  < > 17  < > 24* 22 21  --  19 20  CREATININE 0.93  < > 0.76  < > <0.20*  < > 0.72  < > 0.84 0.76 0.81  --  0.74 0.81  CALCIUM 8.3*  < > 8.2*  < > 8.1*  < > 8.1*  < > 8.5  7.8* 8.3*  --  9.1 8.4  MG 1.9  --  2.3  --  1.8  --  2.1  --  1.8  --   --   --   --   --   PHOS 2.5  --  2.1*  --  1.4*  --  1.9*  --  2.9  --   --   --   --   --   < > = values in this interval not displayed.  Liver Function Tests:  Recent Labs Lab 12/24/12 0500 12/25/12 0339 12/26/12 0355 12/27/12 0450 12/28/12 0428  AST 28 34 26 23 26   ALT 15 18 11 11 13   ALKPHOS 80 78 78 120* 145*  BILITOT 1.2 1.1 2.0* 1.5* 2.0*  PROT 4.3* 4.3* 4.8* 4.7* 5.0*  ALBUMIN 1.9* 1.9* 2.8* 2.2* 2.5*   No results found for this basename: LIPASE, AMYLASE,  in the last 168 hours No results found for this basename: AMMONIA,  in the last 168 hours  CBC:  Recent Labs Lab 12/24/12 0500  12/25/12 0339  12/26/12 1500 12/27/12 0450 12/27/12 0906 12/27/12 1230 12/27/12 1700 12/28/12 0428  WBC 17.2*  --  19.3*  < > 23.9* 24.1*  --  22.2* 25.2* 31.3*  NEUTROABS 12.9*  --  15.1*  --   --   --   --   --   --   --   HGB 7.5*  < > 7.3*  < > 7.9* 9.9* 10.2* 9.1* 9.2* 9.0*  HCT 22.5*  < > 21.3*  < > 22.5* 28.2* 30.0* 26.1* 25.6* 25.9*  MCV 87.5  --  87.3  < > 85.2 84.2  --  84.5 84.5 84.6  PLT 158  --  178  < >  275 297  --  258 282 318  < > = values in this interval not displayed.  INR:  Recent Labs Lab 12/22/12 0432 12/26/12 0355  INR 1.13 1.25    Imaging: Dg Chest [facility] 1 View  12/27/2012   *RADIOLOGY REPORT*  Clinical Data: Postop for ventricular assist device.  PORTABLE CHEST - 1 VIEW  Comparison: 12/27/2012 at 0600 hours  Findings: 1240 hours.  Endotracheal tube terminates 1.6 cm above the carina.  Swan-Ganz catheter tip in proximal right pulmonary artery.  There is also a right-sided subclavian line whose tip is difficult to visualize.  Pacer / AICD device unchanged.  Ventricular assist device projects similarly, over the cardiac apex.  2 left chest tubes remain in place, without pneumothorax.  Nasogastric tube extends beyond the  inferior aspect of the film. Cardiomegaly accentuated by AP portable technique.  No definite pleural fluid.  Removal of right chest tube.  Mild right midlung pulmonary opacity surrounds the presumed chest tube tract. Persistent left base patchy airspace disease .  IMPRESSION: Removal of right-sided chest tube.  Presumed atelectasis in the right mid lung, along the presumed tract of the chest tube.  Similar left base airspace disease, likely atelectasis.  Endotracheal tube borderline low in position.  Consider retraction 2 cm versus attention on follow-up radiographs.  Otherwise similar support apparatus, without evidence of pneumothorax.   Original Report Authenticated By: Jeronimo Greaves, M.D.   Dg Chest Port 1 View  12/27/2012   *RADIOLOGY REPORT*  Clinical Data: Ventricular assistance device  PORTABLE CHEST - 1 VIEW  Comparison: December 26, 2012.  Findings: Endotracheal tube is in grossly good position and unchanged compared to prior exam.  Left internal jugular Swan-Ganz catheter is  noted with tip directed toward right pulmonary artery. Right subclavian venous catheter is unchanged in position.  Left- sided defibrillator is unchanged in position.  Bilateral chest tubes are  unchanged in position without evidence of pneumothorax. Left basilar airspace opacity remains with possible associated pleural effusion.  Ventricular assistance device is again noted over the left chest and upper abdomen.  IMPRESSION: No pneumothorax is seen.  Stable left basilar airspace opacity compared to prior exam.  Stable support apparatus.   Original Report Authenticated By: Lupita Raider.,  M.D.     Medications:     Scheduled Medications: . antiseptic oral rinse  15 mL Mouth Rinse QID  . aspirin  324 mg Per Tube Daily  . aspirin  300 mg Rectal Daily  . bisacodyl  10 mg Oral Daily   Or  . bisacodyl  10 mg Rectal Daily  . cefTAZidime (FORTAZ)  IV  1 g Intravenous Q8H  . chlorhexidine  15 mL Mouth Rinse BID  . dexmedetomidine  0.4-1.2 mcg/kg/hr Intravenous To OR  . docusate  200 mg Per Tube Daily  . DOPamine  3-10 mcg/kg/min Intravenous To OR  . insulin aspart  0-24 Units Subcutaneous Q4H  . levalbuterol  1.25 mg Nebulization Q6H  . magic mouthwash  5 mL Oral QID  . mupirocin ointment  1 application Topical BID  . sodium chloride  10 mL Intravenous Q12H  . vancomycin  1,000 mg Intravenous Q12H  . Warfarin - Physician Dosing Inpatient   Does not apply q1800    Infusions: . sodium chloride 50 mL/hr (12/27/12 0838)  . sodium chloride 20 mL/hr at 12/28/12 0531  . sodium chloride Stopped (12/28/12 0000)  . sodium chloride 250 mL (12/25/12 0220)  . amiodarone (NEXTERONE PREMIX) 360 mg/200 mL dextrose 30 mg/hr (12/27/12 2345)  . dexmedetomidine 0.6 mcg/kg/hr (12/28/12 0242)  . DOBUTamine 4 mcg/kg/min (12/27/12 2300)  . DOPamine 4 mcg/kg/min (12/28/12 0141)  . epinephrine 8 mcg/min (12/28/12 0242)  . fentaNYL infusion INTRAVENOUS 125 mcg/hr (12/28/12 0133)  . furosemide (LASIX) infusion 6 mg/hr (12/27/12 2300)  . heparin 900 Units/hr (12/27/12 2300)  . lactated ringers Stopped (12/28/12 0000)  . midazolam (VERSED) infusion 4 mg/hr (12/28/12 0133)  . nitroGLYCERIN Stopped  (12/18/12 1705)  . norepinephrine (LEVOPHED) Adult infusion 8 mcg/min (12/27/12 2300)  . pantoprozole (PROTONIX) infusion 8 mg/hr (12/28/12 0135)  . phenylephrine (NEO-SYNEPHRINE) Adult infusion Stopped (12/24/12 0000)    PRN Medications: acetaminophen (TYLENOL) oral liquid 160 mg/5 mL, diphenhydrAMINE, fentaNYL, midazolam, morphine injection, ondansetron (ZOFRAN) IV, ondansetron (ZOFRAN) IV, potassium chloride, sodium chloride, sodium chloride   Assessment:   1. Acute on chronic systolic HF with biventricular HF EF 10%  --S/p HM II VAD with TV repair 12/18/12  --s/p RVAD. - explanted 8/25 2. 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 Cardiogenic shock  4. VT s/p Biotronik ICD on amio 5. Chronic renal failure, stage III 6. 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 ABL anemia  8. PNA 9. Severe protein calorie malnutrition - on TPN 10. Hyponatremia 11. GI bleeding with [place] Artist tear  Plan/Discussion:    Centrimag out. Chest closed. Holding her own but still tenuous. Volume way up.   Will try to start weaning epi slowly. Need to start trying to push diuresis. Increase lasix gtt to 8. May need to go even higher or add dose of metolazone. Would like to see her be at least 1-1.5L negative to day. Post-pyloric tube to be placed  today for feeds.   WBC climbing. May need to expand abx (? Imipenem) depending on course. Anticoagulating carefully in setting of MW tear.  The patient is critically ill with multiple organ systems failure and requires high complexity decision making for assessment and support, frequent evaluation and titration of therapies, application of advanced monitoring technologies and extensive interpretation of multiple databases.   Critical Care Time devoted to patient care services described in this note is 40 Minutes.  [name] 6:28 AM   Length of Stay: 20 VAD Team Pager [phone] (7am - 7am) VAD Issue  Non-VAD  issues HF Pager [phone]

## 2012-12-28 NOTE — Progress Notes (Addendum)
[facility] Daily Rounding Note 12/28/2012, 8:25 AM  SUBJECTIVE:       No BMs.  No vomiting.  No gastric tube since EGD on 8/26. Radiology to place feeding tube this AM.  TPN discontinued as plan is to initiate tube feeds today.  S/p sternal wound closure yesterday 8/27.   OBJECTIVE:         Vital signs in last 24 hours:    Temp:  [99.3 F (37.4 C)-101.8 F (38.8 C)] 100.6 F (38.1 C) (08/28 0800) Pulse Rate:  [27-114] 101 (08/28 0800) Resp:  [20-27] 23 (08/28 0800) BP: (87-93)/(74-77) 93/77 mmHg (08/28 0734) SpO2:  [52 %-100 %] 100 % (08/28 0800) FiO2 (%):  [50 %-60 %] 50 % (08/28 0734) Weight:  [92.2 kg (203 lb 4.2 oz)] 92.2 kg (203 lb 4.2 oz) (08/28 0500) Last BM Date: 12/15/12 General: critically ill.  Multiple pumps infusing meds   Heart: machine hum of LVAD.  Sternal wound not examined Chest: somewhat rough BS.  Intubated, non vent with even, unlabored breathing.  Abdomen: tight, slightly distended.  NT.  Hypoactive BS  Extremities: 2 + edema in feet, legs. Neuro/Psych:  Sedated on vent, no restlessness.  Not responsive to gentle exam or moderate volume voice.   Intake/Output from previous day: 08/27 0701 - 08/28 0700 In: 4758.7 [I.V.:3758.7; IV Piggyback:1000] Out: 3505 [Urine:3045; Chest Tube:460]  Intake/Output this shift: Total I/O In: 185.5 [I.V.:135.5; IV Piggyback:50] Out: -   Lab Results:  Recent Labs  12/27/12 1230 12/27/12 1700 12/28/12 0428  WBC 22.2* 25.2* 31.3*  HGB 9.1* 9.2* 9.0*  HCT 26.1* 25.6* 25.9*  PLT 258 282 318   BMET  Recent Labs  12/27/12 0450 12/27/12 0906 12/27/12 1230 12/28/12 0428  NA 129* 131* 128* 129*  K 3.5 4.0 3.5 3.6  CL 97  --  96 96  CO2 24  --  22 21  GLUCOSE 137* 138* 143* 144*  BUN 21  --  19 20  CREATININE 0.81  --  0.74 0.81  CALCIUM 8.3*  --  9.1 8.4   LFT  Recent Labs  12/26/12 0355 12/27/12 0450 12/28/12 0428  PROT 4.8* 4.7* 5.0*  ALBUMIN 2.8* 2.2* 2.5*  AST 26 23 26   ALT 11 11 13    ALKPHOS 78 120* 145*  BILITOT 2.0* 1.5* 2.0*   PT/INR  Recent Labs  12/26/12 0355  LABPROT 15.4*  INR 1.25    Studies/Results: Dg Chest Port 1 View 12/28/2012   Findings: Endotracheal tube is in grossly good position with distal tip 13 mm above the carina.  Left internal jugular Swan-Ganz catheter is noted with tip in main pulmonary artery.  Right subclavian catheter line is unchanged.  Left-sided defibrillator is again noted and unchanged.  Left-sided chest tube is unchanged in position with no evidence of pneumothorax.  Right midlung opacity noted on prior exam is decreased consistent with improving pneumonia or subsegmental atelectasis.  Left ventricular assistance device is again noted and unchanged. Left basilar opacity is unchanged compared to prior exam.  IMPRESSION: Stable left basilar opacity.  No pneumothorax seen.  Right midlung opacity is improved compared to prior exam.   Original Report Authenticated By: Lupita Raider.,  M.D.     ASSESMENT: * Upper GI bleed.  EGD 8/26: Tear at gastric cardia: epi injected and 2 endoclips placed.  Restarted Heparin 8/26.  * ABL anemia: Post op losses, GI bleed are sources. Hgb improved. S/p at least 17 units PRBCs since  surgeries.  Hgb stable.  * Sp LVAD, tricuspid Valve repair 8/18  * Sp emergent temporary RVAD placement 8/19  RVAD removed 8/25, sternal wound closure 8/27.  * Evolving left lung pneumonia. On Vanc  * Protein malnutrition. Low prealbumin. TPN discontinued.  Nutrition rec for TF: Vital 1.5 with goal rate 60 ml/hour.  * Hyperglycemia. No hx of DM   PLAN: *  Complete PPI infusion.  Will finish 8/20 at 1330.  Then begin BID IV Protonix, eventually transition to enteric Protonix.  *  Radiology at bedside to place panda.  *   On daily CBC schedule.    LOS: 20 days   Jennye Moccasin  12/28/2012, 8:25 AM Pager: [phone]

## 2012-12-28 NOTE — Progress Notes (Signed)
CRITICAL VALUE ALERT  Critical value received: ionized calcium .61  Date of notification:  12/28/12  Time of notification:  1615  Critical value read back:yes  Nurse who received alert:  Acey Lav rn  MD notified (1st page):  Dr Donata Clay  Time of first page:  1630  MD notified (2nd page):  Time of second page:  Responding MD:  Dr Lindajo Royal  Time MD responded:  [phone]

## 2012-12-28 NOTE — Progress Notes (Addendum)
ANTIBIOTIC CONSULT NOTE   Pharmacy Consult for Vancomycin Indication: rule out pneumonia  No Known Allergies  Patient Measurements: Height: 5\' 9"  (175.3 cm) Weight: 203 lb 4.2 oz (92.2 kg) IBW/kg (Calculated) : 66.2  Vital Signs: Temp: 100.6 F (38.1 C) (08/28 1715) BP: 85/73 mmHg (08/28 1647) Pulse Rate: 99 (08/28 1715) Intake/Output from previous day: 08/27 0701 - 08/28 0700 In: 4758.7 [I.V.:3758.7; IV Piggyback:1000] Out: 3505 [Urine:3045; Chest Tube:460] Intake/Output from this shift: Total I/O In: 1982.2 [I.V.:1602.2; NG/GT:30; IV Piggyback:350] Out: 2045 [Urine:1875; Chest Tube:170]  Labs:  Recent Labs  12/27/12 1230 12/27/12 1700 12/28/12 0428 12/28/12 1700 12/28/12 1732 12/28/12 1749  WBC 22.2* 25.2* 31.3*  --   --   --   HGB 9.1* 9.2* 9.0*  --  15.0 8.8*  PLT 258 282 318  --   --   --   CREATININE 0.74  --  0.81 0.77 1.00 0.80   Estimated Creatinine Clearance: 84.8 ml/min (by C-G formula based on Cr of 0.8).  Recent Labs  12/26/12 1351 12/28/12 1700  VANCOTROUGH 22.1* 21.3*    Assessment: 67 y.o. F on Vancomycin + primaxin for r/o PNA with a slightly supratherapeutic Vanc trough this evening. Renal function appears stable, also on lasix drip.Patient likely accumulating. Will adjust dose to provide expected trough ~17. Goal of Therapy:  Vancomycin trough level 15-20 mcg/ml  Plan:  1. Vancomycin 1500mg  IV q18 hours  2. Will continue to follow renal function, culture results, LOT, and antibiotic de-escalation plans   Sheppard Coil PharmD., BCPS Clinical Pharmacist Pager [phone] 12/28/2012 6:14 PM  Patient also receiving bivalirudin for possible heparin resistance. Aptt resulted at low end of goal at 54s. D/w Dr.VanTright and will slightly increase rate by 20% to achieve aptt >60s. Aptt already ordered for ~2300 for recheck.  Plan: Increase bivalirudin to 0.12mg /kg/hr (using dry dosing weight of 75kg) Aptt in 4 hours Further rate adjustments per  MD.  Sheppard Coil PharmD., BCPS Clinical Pharmacist Pager [phone] 12/28/2012 8:40 PM

## 2012-12-28 NOTE — Progress Notes (Signed)
CT surgery p.m. Rounds  Stable day Diuresis with Lasix drip Feeding tube placed-Coumadin dosing via tube started on bivalirudin drip with PTT goal 50-70 Epinephrine drip weaned to 3 mcg Advancing tube feeds Cardiac index remains 2.0

## 2012-12-29 ENCOUNTER — Inpatient Hospital Stay (HOSPITAL_COMMUNITY): Payer: Commercial Managed Care - PPO

## 2012-12-29 ENCOUNTER — Encounter (HOSPITAL_COMMUNITY): Payer: Self-pay | Admitting: Cardiothoracic Surgery

## 2012-12-29 DIAGNOSIS — Z95818 Presence of other cardiac implants and grafts: Secondary | ICD-10-CM

## 2012-12-29 DIAGNOSIS — I428 Other cardiomyopathies: Secondary | ICD-10-CM

## 2012-12-29 DIAGNOSIS — I5022 Chronic systolic (congestive) heart failure: Secondary | ICD-10-CM

## 2012-12-29 LAB — GLUCOSE, CAPILLARY
Glucose-Capillary: 157 mg/dL — ABNORMAL HIGH (ref 70–99)
Glucose-Capillary: 142 mg/dL — ABNORMAL HIGH (ref 70–99)

## 2012-12-29 LAB — BASIC METABOLIC PANEL
BUN: 21 mg/dL (ref 6–23)
CO2: 22 mEq/L (ref 19–32)
Calcium: 8.5 mg/dL (ref 8.4–10.5)
Chloride: 100 mEq/L (ref 96–112)
Creatinine, Ser: 0.82 mg/dL (ref 0.50–1.10)
GFR calc Af Amer: 85 mL/min — ABNORMAL LOW (ref >90)
GFR calc non Af Amer: 73 mL/min — ABNORMAL LOW (ref >90)
Glucose, Bld: 176 mg/dL — ABNORMAL HIGH (ref 70–99)
Potassium: 2.6 mEq/L — CL (ref 3.5–5.1)
Sodium: 134 mEq/L — ABNORMAL LOW (ref 135–145)

## 2012-12-29 LAB — CALCIUM, IONIZED
Calcium, Ion: 0.61 mmol/L — CL (ref 1.13–1.30)
Calcium, Ion: 1.2 mmol/L (ref 1.13–1.30)

## 2012-12-29 LAB — CBC
HCT: 24.7 % — ABNORMAL LOW (ref 36.0–46.0)
HCT: 26.5 % — ABNORMAL LOW (ref 36.0–46.0)
Hemoglobin: 8.6 g/dL — ABNORMAL LOW (ref 12.0–15.0)
Hemoglobin: 9.2 g/dL — ABNORMAL LOW (ref 12.0–15.0)
MCH: 29.9 pg (ref 26.0–34.0)
MCH: 30.1 pg (ref 26.0–34.0)
MCHC: 34.8 g/dL (ref 30.0–36.0)
MCHC: 34.7 g/dL (ref 30.0–36.0)
MCV: 85.8 fL (ref 78.0–100.0)
MCV: 86.6 fL (ref 78.0–100.0)
Platelets: 347 10*3/uL (ref 150–400)
Platelets: 403 10*3/uL — ABNORMAL HIGH (ref 150–400)
RBC: 2.88 MIL/uL — ABNORMAL LOW (ref 3.87–5.11)
RBC: 3.06 MIL/uL — ABNORMAL LOW (ref 3.87–5.11)
RDW: 16.4 % — ABNORMAL HIGH (ref 11.5–15.5)
RDW: 16.4 % — ABNORMAL HIGH (ref 11.5–15.5)
WBC: 23.2 10*3/uL — ABNORMAL HIGH (ref 4.0–10.5)
WBC: 23.6 10*3/uL — ABNORMAL HIGH (ref 4.0–10.5)

## 2012-12-29 LAB — BLOOD GAS, ARTERIAL
Acid-base deficit: 1.4 mmol/L (ref 0.0–2.0)
Bicarbonate: 21.9 mEq/L (ref 20.0–24.0)
Drawn by: 39866
FIO2: 0.5 %
MECHVT: 450 mL
Nitric Oxide: 30
O2 Saturation: 99.5 %
PEEP: 5 cmH2O
Patient temperature: 99.1
RATE: 12 resp/min
TCO2: 22.9 mmol/L (ref 0–100)
pCO2 arterial: 32.1 mmHg — ABNORMAL LOW (ref 35.0–45.0)
pH, Arterial: 7.451 — ABNORMAL HIGH (ref 7.350–7.450)
pO2, Arterial: 98.7 mmHg (ref 80.0–100.0)

## 2012-12-29 LAB — COMPREHENSIVE METABOLIC PANEL
ALT: 20 U/L (ref 0–35)
AST: 35 U/L (ref 0–37)
Albumin: 2.1 g/dL — ABNORMAL LOW (ref 3.5–5.2)
Alkaline Phosphatase: 163 U/L — ABNORMAL HIGH (ref 39–117)
BUN: 21 mg/dL (ref 6–23)
CO2: 22 mEq/L (ref 19–32)
Calcium: 8 mg/dL — ABNORMAL LOW (ref 8.4–10.5)
Chloride: 100 mEq/L (ref 96–112)
Creatinine, Ser: 0.79 mg/dL (ref 0.50–1.10)
GFR calc Af Amer: >90 mL/min (ref >90)
GFR calc non Af Amer: 86 mL/min — ABNORMAL LOW (ref >90)
Glucose, Bld: 161 mg/dL — ABNORMAL HIGH (ref 70–99)
Potassium: 2.8 mEq/L — ABNORMAL LOW (ref 3.5–5.1)
Sodium: 133 mEq/L — ABNORMAL LOW (ref 135–145)
Total Bilirubin: 1.6 mg/dL — ABNORMAL HIGH (ref 0.3–1.2)
Total Protein: 5.2 g/dL — ABNORMAL LOW (ref 6.0–8.3)

## 2012-12-29 LAB — CARBOXYHEMOGLOBIN
Carboxyhemoglobin: 1.3 % (ref 0.5–1.5)
Methemoglobin: 1.5 % (ref 0.0–1.5)
O2 Saturation: 55.3 %
Total hemoglobin: 8.4 g/dL — ABNORMAL LOW (ref 12.0–16.0)

## 2012-12-29 LAB — APTT
aPTT: 73 seconds — ABNORMAL HIGH (ref 24–37)
aPTT: 75 seconds — ABNORMAL HIGH (ref 24–37)
aPTT: 75 seconds — ABNORMAL HIGH (ref 24–37)
aPTT: 82 seconds — ABNORMAL HIGH (ref 24–37)

## 2012-12-29 LAB — PROTIME-INR
INR: 1.85 — ABNORMAL HIGH (ref 0.00–1.49)
Prothrombin Time: 20.8 seconds — ABNORMAL HIGH (ref 11.6–15.2)

## 2012-12-29 LAB — PROCALCITONIN: Procalcitonin: 1.1 ng/mL

## 2012-12-29 LAB — LACTATE DEHYDROGENASE: LDH: 350 U/L — ABNORMAL HIGH (ref 94–250)

## 2012-12-29 MED ORDER — POTASSIUM CHLORIDE 10 MEQ/50ML IV SOLN
10.0000 meq | INTRAVENOUS | Status: AC
  Administered 2012-12-29 (×5): 10 meq via INTRAVENOUS

## 2012-12-29 MED ORDER — POTASSIUM CHLORIDE 10 MEQ/50ML IV SOLN
INTRAVENOUS | Status: AC
  Filled 2012-12-29: qty 250

## 2012-12-29 MED ORDER — WARFARIN SODIUM 2.5 MG PO TABS: 2.5000 mg | ORAL_TABLET | Freq: Once | ORAL | Status: DC

## 2012-12-29 MED ORDER — PANTOPRAZOLE SODIUM 40 MG PO PACK
40.0000 mg | PACK | Freq: Two times a day (BID) | ORAL | Status: DC
  Administered 2012-12-29 – 2013-01-06 (×17): 40 mg
  Filled 2012-12-29 (×21): qty 20

## 2012-12-29 MED ORDER — POTASSIUM CHLORIDE 10 MEQ/50ML IV SOLN: 10.0000 meq | INTRAVENOUS | Status: DC

## 2012-12-29 MED ORDER — WARFARIN SODIUM 2.5 MG PO TABS
2.5000 mg | ORAL_TABLET | Freq: Once | ORAL | Status: DC
  Filled 2012-12-29: qty 1

## 2012-12-29 MED ORDER — METOCLOPRAMIDE HCL 5 MG/ML IJ SOLN
10.0000 mg | Freq: Four times a day (QID) | INTRAMUSCULAR | Status: DC
  Administered 2012-12-29 – 2012-12-31 (×7): 10 mg via INTRAVENOUS
  Filled 2012-12-29 (×11): qty 2

## 2012-12-29 MED ORDER — POTASSIUM CHLORIDE 10 MEQ/50ML IV SOLN
10.0000 meq | INTRAVENOUS | Status: AC | PRN
  Administered 2012-12-29 (×3): 10 meq via INTRAVENOUS

## 2012-12-29 MED ORDER — METOLAZONE 5 MG PO TABS
5.0000 mg | ORAL_TABLET | Freq: Every day | ORAL | Status: AC
Start: 2012-12-29 — End: 2012-12-31
  Administered 2012-12-29 – 2012-12-31 (×3): 5 mg
  Filled 2012-12-29 (×4): qty 1

## 2012-12-29 MED ORDER — POTASSIUM CHLORIDE 10 MEQ/50ML IV SOLN
10.0000 meq | INTRAVENOUS | Status: AC
  Administered 2012-12-29 (×2): 10 meq via INTRAVENOUS
  Filled 2012-12-29: qty 100

## 2012-12-29 NOTE — Progress Notes (Signed)
Nitric titrated to 25ppm per MD order. Pt tolerating well at this time. RT will continue to monitor.

## 2012-12-29 NOTE — Progress Notes (Signed)
I have personally taken an interval history, reviewed the chart, and examined the patient.  I agree with the extender's note, impression and recommendations.  Robert D. Kaplan, MD, FACG [facility] [phone]  

## 2012-12-29 NOTE — Progress Notes (Signed)
. HeartMate 2 Rounding Note  Subjective:   Postop day #11 and a HeartMate 2 implantation with subsequent emergency centri- mag RVAD because of  RV dysfunction. Patient with stable hemodynamics on BiVAD support.Returned to OR 8-25 for successful removal of RVAD Responsive but comfortable with IV sedation.  Had successful delayed sternal closure yesterday 8-28-  Patient continues to have stable hemodynamics after chest closure and we are slowly weaning inotropes and inhaled nitric oxide. Patient has significant fluid overload in his diureses well on Lasix drip CVP remains 15, LVAD flow rates 3.3 L per minute, cardiac index 2.0   Patient was placed on bivalirudin yesterday due to heparin resistance and dose currently is 0.12 mg per kilogram per minute with PTT of 75  Patient has been dosed with Coumadin 5 mg twice via feeding tube and last INR 1.8 with goal 2.5  UGI bleed from M-W tear- now inactive, on iv protonix  Feeding tube post-pyloric and Vital 1.5 started--rectal tube placed for diarrhea  10 units diuresis for fluid overload- lasix drip  WBC >30k> 25>20 with procalcitonin 1.3, on Vanc, Primaxin and short course of IV Diflucan  Patient has been sedated but will react and followed commands appropriately when her sedation is lightened. She remains on the ventilator in order to provide inhaled nitric oxide which he needs for significant RV dysfunction which is now improving. Weaning nitric oxide slowly and then extubation   LVAD INTERROGATION:  HeartMate II LVAD:  Flow 3.1 liters/min, speed 8400 power 3.2, PI 4.8.   Objective:    Vital Signs:   Temp:  [98.1 F (36.7 C)-100.2 F (37.9 C)] 99.5 F (37.5 C) (08/29 1900) Pulse Rate:  [29-101] 29 (08/29 1900) Resp:  [14-27] 21 (08/29 1900) BP: (75-121)/(68-94) 121/94 mmHg (08/29 1526) SpO2:  [97 %-100 %] 100 % (08/29 1900) FiO2 (%):  [50 %] 50 % (08/29 1900) Weight:  [201 lb 1 oz (91.2 kg)] 201 lb 1 oz (91.2 kg) (08/29  0500) Last BM Date: 12/29/12 Mean arterial Pressure 84  Intake/Output:   Intake/Output Summary (Last 24 hours) at 12/29/12 1912 Last data filed at 12/29/12 1900  Gross per 24 hour  Intake 5647.83 ml  Output   7780 ml  Net -2132.17 ml     Physical Exam: General: Intubated in ICU on right ventricular assist device, sedated and comfortable HEENT: normal Neck: supple. JVP normal  No lymphadenopathy or thryomegaly appreciated. Cor: Mechanical heart sounds with LVAD hum present. Lungs: clear Abdomen: soft, nontender, nondistended. No hepatosplenomegaly. No bruits or masses. Good bowel sounds. Extremities: no cyanosis, clubbing, rash, edema extremities warm Neuro: moves all 4 extremities but now is on sedation protocol while on ventilator  Telemetry: AV paced 90 per minute  Labs: Basic Metabolic Panel:  Recent Labs Lab 12/23/12 0402  12/24/12 0500  12/25/12 0339  12/26/12 0355  12/27/12 1230 12/28/12 0428 12/28/12 1700 12/28/12 1732 12/28/12 1749 12/29/12 0435 12/29/12 1600  NA 133*  < > 130*  < > 131*  < > 130*  < > 128* 129* 131* 132* 132* 133* 134*  K 3.7  < > 4.1  < > 3.8  < > 5.2*  < > 3.5 3.6 3.0* 2.9* 2.9* 2.8* 2.6*  CL 98  < > 94*  < > 97  < > 98  < > 96 96 100 102 101 100 100  CO2 29  < > 27  < > 31  < > 23  < > 22 21 19   --   --  22 22  GLUCOSE 126*  < > 301*  < > 142*  < > 142*  < > 143* 144* 152* 148* 147* 161* 176*  BUN 22  < > 13  < > 17  < > 24*  < > 19 20 21 19 20 21 21   CREATININE 0.76  < > <0.20*  < > 0.72  < > 0.84  < > 0.74 0.81 0.77 1.00 0.80 0.79 0.82  CALCIUM 8.2*  < > 8.1*  < > 8.1*  < > 8.5  < > 9.1 8.4 8.2*  --   --  8.0* 8.5  MG 2.3  --  1.8  --  2.1  --  1.8  --   --   --   --   --   --   --   --   PHOS 2.1*  --  1.4*  --  1.9*  --  2.9  --   --   --   --   --   --   --   --   < > = values in this interval not displayed.  Liver Function Tests:  Recent Labs Lab 12/25/12 0339 12/26/12 0355 12/27/12 0450 12/28/12 0428 12/29/12 0435  AST  34 26 23 26  35  ALT 18 11 11 13 20   ALKPHOS 78 78 120* 145* 163*  BILITOT 1.1 2.0* 1.5* 2.0* 1.6*  PROT 4.3* 4.8* 4.7* 5.0* 5.2*  ALBUMIN 1.9* 2.8* 2.2* 2.5* 2.1*   No results found for this basename: LIPASE, AMYLASE,  in the last 168 hours No results found for this basename: AMMONIA,  in the last 168 hours  CBC:  Recent Labs Lab 12/24/12 0500  12/25/12 0339  12/27/12 1700 12/28/12 0428 12/28/12 1727 12/28/12 1732 12/28/12 1749 12/29/12 0435 12/29/12 1600  WBC 17.2*  --  19.3*  < > 25.2* 31.3* 26.2*  --   --  23.2* 23.6*  NEUTROABS 12.9*  --  15.1*  --   --   --   --   --   --   --   --   HGB 7.5*  < > 7.3*  < > 9.2* 9.0* 8.9* 15.0 8.8* 8.6* 9.2*  HCT 22.5*  < > 21.3*  < > 25.6* 25.9* 25.4* 44.0 26.0* 24.7* 26.5*  MCV 87.5  --  87.3  < > 84.5 84.6 84.9  --   --  85.8 86.6  PLT 158  --  178  < > 282 318 342  --   --  347 403*  < > = values in this interval not displayed.  INR:  Recent Labs Lab 12/26/12 0355 12/29/12 1349  INR 1.25 1.85*    Other results:  EKG:   Imaging: Dg Abd 1 View  12/28/2012   *RADIOLOGY REPORT*  Clinical Data: Feeding tube placement.  ABDOMEN - 1 VIEW  Comparison: None.  Findings: Feeding tube was placed under fluoroscopy by Vanita Ingles, Radiology Technologist with the tip at the ligament of Treitz.  5 minutes, 26 seconds of fluoroscopy time was used in conjunction with the procedure.  Position of the tip was confirmed by injecting contrast material.  IMPRESSION: Feeding tube tip in good position at the ligament of Treitz.   Original Report Authenticated By: Holley Dexter, M.D.   Dg Chest Port 1 View  12/29/2012   *RADIOLOGY REPORT*  Clinical Data: Ventricular assistance device.  PORTABLE CHEST - 1 VIEW  Comparison: December 28, 2012.  Findings: Stable cardiomegaly.  Left-sided defibrillator is unchanged.  Distal tip of left internal jugular Swan-Ganz catheter is in expected position of main pulmonary artery.  Right subclavian catheter is  unchanged in position. Left-sided chest tube is unchanged without evidence of pneumothorax.  The right midlung opacity is slightly improved compared to prior exam consistent with pneumonia or atelectasis.  Left ventricular assistance device is again noted and unchanged in position.  IMPRESSION: Stable support apparatus.  Slightly improved right midlung opacity consistent with pneumonia or subsegmental atelectasis.   Original Report Authenticated By: Lupita Raider.,  M.D.   Dg Chest Port 1 View  12/28/2012   *RADIOLOGY REPORT*  Clinical Data:  ventricular assist device,  PORTABLE CHEST - 1 VIEW  Comparison: 12/28/2012  Findings: Cardiomegaly again noted.  Triangular shaped airspace disease and right midlung is stable.  Endotracheal tube is unchanged in position.  Stable left Swan-Ganz catheter position. Ventricular assist device is unchanged in position.  A single lead cardiac pacemaker is unchanged in position.  Stable small left pleural effusion with left lower lobe atelectasis or infiltrate. Left-sided chest tube is unchanged in position. Status post median sternotomy. No diagnostic pneumothorax.  IMPRESSION: Stable support apparatus. Stable opacity in the right midlung. Again noted small left pleural effusion with left lower lobe atelectasis or infiltrate.  Left chest tube is unchanged in position.  No diagnostic pneumothorax.   Original Report Authenticated By: Natasha Mead, M.D.   Dg Chest Port 1 View  12/28/2012   *RADIOLOGY REPORT*  Clinical Data: Left ventricular assistance device.  PORTABLE CHEST - 1 VIEW  Comparison: December 27, 2012.  Findings: Endotracheal tube is in grossly good position with distal tip 13 mm above the carina.  Left internal jugular Swan-Ganz catheter is noted with tip in main pulmonary artery.  Right subclavian catheter line is unchanged.  Left-sided defibrillator is again noted and unchanged.  Left-sided chest tube is unchanged in position with no evidence of pneumothorax.  Right  midlung opacity noted on prior exam is decreased consistent with improving pneumonia or subsegmental atelectasis.  Left ventricular assistance device is again noted and unchanged. Left basilar opacity is unchanged compared to prior exam.  IMPRESSION: Stable left basilar opacity.  No pneumothorax seen.  Right midlung opacity is improved compared to prior exam.   Original Report Authenticated By: Lupita Raider.,  M.D.   Dg Naso G Tube Plc W/fl-no Rad  12/28/2012   CLINICAL DATA: post pyloric feeding tube   NASO G TUBE PLACEMENT WITH FLUORO  Fluoroscopy was utilized by the requesting physician.  No radiographic  interpretation.      Medications:     Scheduled Medications: . antiseptic oral rinse  15 mL Mouth Rinse QID  . aspirin  324 mg Per Tube Daily  . aspirin  300 mg Rectal Daily  . bisacodyl  10 mg Oral Daily   Or  . bisacodyl  10 mg Rectal Daily  . chlorhexidine  15 mL Mouth Rinse BID  . docusate  200 mg Per Tube Daily  . feeding supplement  60 mL Oral BID  . feeding supplement (VITAL 1.5 CAL)  40 mL/hr Per Tube Q24H  . fluconazole (DIFLUCAN) IV  100 mg Intravenous Daily  . imipenem-cilastatin  500 mg Intravenous Q6H  . insulin aspart  0-24 Units Subcutaneous Q4H  . levalbuterol  1.25 mg Nebulization Q6H  . magic mouthwash  5 mL Oral QID  . metoCLOPramide (REGLAN) injection  10 mg Intravenous Q6H  . metolazone  5 mg Per Tube  Daily  . pantoprazole sodium  40 mg Per Tube BID  . potassium chloride  10 mEq Intravenous Q1 Hr x 5  . sodium chloride  10 mL Intravenous Q12H  . vancomycin  1,500 mg Intravenous Q18H    Infusions: . sodium chloride 20 mL/hr at 12/29/12 0900  . sodium chloride 20 mL/hr at 12/29/12 0417  . sodium chloride Stopped (12/28/12 0000)  . sodium chloride 30 mL/hr at 12/28/12 2000  . amiodarone (NEXTERONE PREMIX) 360 mg/200 mL dextrose 30 mg/hr (12/29/12 1900)  . bivalirudin (ANGIOMAX) infusion 0.5 mg/mL (Non-ACS indications) 0.12 mg/kg/hr (12/29/12 1900)  .  dexmedetomidine 0.6 mcg/kg/hr (12/29/12 1900)  . DOBUTamine 4 mcg/kg/min (12/29/12 1900)  . DOPamine 4 mcg/kg/min (12/29/12 1900)  . epinephrine 3 mcg/min (12/29/12 1900)  . fentaNYL infusion INTRAVENOUS 125 mcg/hr (12/29/12 1900)  . furosemide (LASIX) infusion 8 mg/hr (12/29/12 1900)  . lactated ringers Stopped (12/28/12 0000)  . midazolam (VERSED) infusion 4 mg/hr (12/29/12 1900)  . nitroGLYCERIN Stopped (12/18/12 1705)  . norepinephrine (LEVOPHED) Adult infusion 4 mcg/min (12/29/12 1900)  . 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 Vent to deliver nitric oxide, when off nitric oxide we will plan extubation    I reviewed the LVAD parameters from today, and compared the results to the patient's prior recorded data.  No programming changes were made.  The LVAD is functioning within specified parameters.  The patient performs LVAD self-test daily.  LVAD interrogation was negative for any significant power changes, alarms or PI events/speed drops.  LVAD equipment check completed and is in good working order.  Back-up equipment present.   LVAD education done on emergency procedures and precautions and reviewed exit site care.  Length of Stay: 21  [name]  III,PETER 12/29/2012, 7:12 PM

## 2012-12-29 NOTE — Progress Notes (Addendum)
HeartMate 2 Rounding Note  Subjective:    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 EF 15-25% with mild to moderate RV dysfunction  Underwent HM II LVAD implant (under destination criteria) along with TV repair on 8/18.  She was taken back to the OR for RVAD support.   On 8/25 was taken back to OR for RVAD explant.  8/26 EGD which showed MW tear in esophagus. Injected and clipped. No further bleeding.   8/27  Back to OR for chest closure.   Co-ox 55%. CI 2.1 on thermo. WBC 31>23. Weight down 2 pounds. Yesterday lasix drip increased to 8 mg per hour. Weight down to 201 (36 pounds up from baseline). Started on angiomax and antibiotics broadened. Had diarrhea from TF. Epi down to 3. Levo at 3. MAPS 70s  LVAD INTERROGATION:   HeartMate II LVAD:  Flow --  Liters/min 3.4, speed 8390  power 3.7, PI 5.2  3 PI events overnight  Objective:    Vital Signs:   Temp:  [98.8 F (37.1 C)-101.1 F (38.4 C)] 99.9 F (37.7 C) (08/29 0600) Pulse Rate:  [50-103] 99 (08/29 0600) Resp:  [19-26] 21 (08/29 0600) BP: (75-86)/(68-77) 75/68 mmHg (08/29 0345) SpO2:  [93 %-100 %] 100 % (08/29 0743) FiO2 (%):  [50 %] 50 % (08/29 0743) Weight:  [201 lb 1 oz (91.2 kg)] 201 lb 1 oz (91.2 kg) (08/29 0500) Last BM Date: 12/15/12 Mean arterial Pressure 60-70s  Intake/Output:   Intake/Output Summary (Last 24 hours) at 12/29/12 0803 Last data filed at 12/29/12 1610  Gross per 24 hour  Intake 5084.87 ml  Output   4655 ml  Net 429.87 ml     Physical Exam: General: Intubated and sedated,  HEENT: normal Neck: supple. CVP 16. Carotids 2+ bilat; no bruits. No lymphadenopathy or thryomegaly appreciated.  Cor: Mechanical heart sounds with LVAD hum present.;Lungs: course Abdomen: soft, nontender, mildly distended. Hypoactive BS Driveline: C/D/I; securement device intact  Extremities: no cyanosis, clubbing, rash, 2-3+ edema. cool Neuro: intubated  sedated, cranial nerves grossly intact. moves all 4 extremities w/o difficulty. Affect pleasant  Telemetry: AV paced 80s  Labs: Basic Metabolic Panel:  Recent Labs Lab 12/23/12 0402  12/24/12 0500  12/25/12 0339  12/26/12 0355  12/27/12 0450  12/27/12 1230 12/28/12 0428 12/28/12 1700 12/28/12 1732 12/28/12 1749 12/29/12 0435  NA 133*  < > 130*  < > 131*  < > 130*  < > 129*  < > 128* 129* 131* 132* 132* 133*  K 3.7  < > 4.1  < > 3.8  < > 5.2*  < > 3.5  < > 3.5 3.6 3.0* 2.9* 2.9* 2.8*  CL 98  < > 94*  < > 97  < > 98  < > 97  --  96 96 100 102 101 100  CO2 29  < > 27  < > 31  < > 23  < > 24  --  22 21 19   --   --  22  GLUCOSE 126*  < > 301*  < > 142*  < > 142*  < > 137*  < > 143* 144* 152* 148* 147* 161*  BUN 22  < > 13  < > 17  < > 24*  < > 21  --  19 20 21 19 20 21   CREATININE 0.76  < > <0.20*  < > 0.72  < >  0.84  < > 0.81  --  0.74 0.81 0.77 1.00 0.80 0.79  CALCIUM 8.2*  < > 8.1*  < > 8.1*  < > 8.5  < > 8.3*  --  9.1 8.4 8.2*  --   --  8.0*  MG 2.3  --  1.8  --  2.1  --  1.8  --   --   --   --   --   --   --   --   --   PHOS 2.1*  --  1.4*  --  1.9*  --  2.9  --   --   --   --   --   --   --   --   --   < > = values in this interval not displayed.  Liver Function Tests:  Recent Labs Lab 12/25/12 0339 12/26/12 0355 12/27/12 0450 12/28/12 0428 12/29/12 0435  AST 34 26 23 26  35  ALT 18 11 11 13 20   ALKPHOS 78 78 120* 145* 163*  BILITOT 1.1 2.0* 1.5* 2.0* 1.6*  PROT 4.3* 4.8* 4.7* 5.0* 5.2*  ALBUMIN 1.9* 2.8* 2.2* 2.5* 2.1*   No results found for this basename: LIPASE, AMYLASE,  in the last 168 hours No results found for this basename: AMMONIA,  in the last 168 hours  CBC:  Recent Labs Lab 12/24/12 0500  12/25/12 0339  12/27/12 1230 12/27/12 1700 12/28/12 0428 12/28/12 1727 12/28/12 1732 12/28/12 1749 12/29/12 0435  WBC 17.2*  --  19.3*  < > 22.2* 25.2* 31.3* 26.2*  --   --  23.2*  NEUTROABS 12.9*  --  15.1*  --   --   --   --   --   --   --   --   HGB  7.5*  < > 7.3*  < > 9.1* 9.2* 9.0* 8.9* 15.0 8.8* 8.6*  HCT 22.5*  < > 21.3*  < > 26.1* 25.6* 25.9* 25.4* 44.0 26.0* 24.7*  MCV 87.5  --  87.3  < > 84.5 84.5 84.6 84.9  --   --  85.8  PLT 158  --  178  < > 258 282 318 342  --   --  347  < > = values in this interval not displayed.  INR:  Recent Labs Lab 12/26/12 0355  INR 1.25    Imaging: Dg Abd 1 View  12/28/2012   *RADIOLOGY REPORT*  Clinical Data: Feeding tube placement.  ABDOMEN - 1 VIEW  Comparison: None.  Findings: Feeding tube was placed under fluoroscopy by Vanita Ingles, Radiology Technologist with the tip at the ligament of Treitz.  5 minutes, 26 seconds of fluoroscopy time was used in conjunction with the procedure.  Position of the tip was confirmed by injecting contrast material.  IMPRESSION: Feeding tube tip in good position at the ligament of Treitz.   Original Report Authenticated By: Holley Dexter, M.D.   Dg Chest Port 1 View  12/28/2012   *RADIOLOGY REPORT*  Clinical Data:  ventricular assist device,  PORTABLE CHEST - 1 VIEW  Comparison: 12/28/2012  Findings: Cardiomegaly again noted.  Triangular shaped airspace disease and right midlung is stable.  Endotracheal tube is unchanged in position.  Stable left Swan-Ganz catheter position. Ventricular assist device is unchanged in position.  A single lead cardiac pacemaker is unchanged in position.  Stable small left pleural effusion with left lower lobe atelectasis or infiltrate. Left-sided chest tube is unchanged in position. Status post median sternotomy.  No diagnostic pneumothorax.  IMPRESSION: Stable support apparatus. Stable opacity in the right midlung. Again noted small left pleural effusion with left lower lobe atelectasis or infiltrate.  Left chest tube is unchanged in position.  No diagnostic pneumothorax.   Original Report Authenticated By: Natasha Mead, M.D.   Dg Chest Port 1 View  12/28/2012   *RADIOLOGY REPORT*  Clinical Data: Left ventricular assistance device.   PORTABLE CHEST - 1 VIEW  Comparison: December 27, 2012.  Findings: Endotracheal tube is in grossly good position with distal tip 13 mm above the carina.  Left internal jugular Swan-Ganz catheter is noted with tip in main pulmonary artery.  Right subclavian catheter line is unchanged.  Left-sided defibrillator is again noted and unchanged.  Left-sided chest tube is unchanged in position with no evidence of pneumothorax.  Right midlung opacity noted on prior exam is decreased consistent with improving pneumonia or subsegmental atelectasis.  Left ventricular assistance device is again noted and unchanged. Left basilar opacity is unchanged compared to prior exam.  IMPRESSION: Stable left basilar opacity.  No pneumothorax seen.  Right midlung opacity is improved compared to prior exam.   Original Report Authenticated By: Lupita Raider.,  M.D.   Dg Chest Port 1 View  12/27/2012   *RADIOLOGY REPORT*  Clinical Data: Postop for ventricular assist device.  PORTABLE CHEST - 1 VIEW  Comparison: 12/27/2012 at 0600 hours  Findings: 1240 hours.  Endotracheal tube terminates 1.6 cm above the carina.  Swan-Ganz catheter tip in proximal right pulmonary artery.  There is also a right-sided subclavian line whose tip is difficult to visualize.  Pacer / AICD device unchanged.  Ventricular assist device projects similarly, over the cardiac apex.  2 left chest tubes remain in place, without pneumothorax.  Nasogastric tube extends beyond the  inferior aspect of the film. Cardiomegaly accentuated by AP portable technique.  No definite pleural fluid.  Removal of right chest tube.  Mild right midlung pulmonary opacity surrounds the presumed chest tube tract. Persistent left base patchy airspace disease .  IMPRESSION: Removal of right-sided chest tube.  Presumed atelectasis in the right mid lung, along the presumed tract of the chest tube.  Similar left base airspace disease, likely atelectasis.  Endotracheal tube borderline low in position.   Consider retraction 2 cm versus attention on follow-up radiographs.  Otherwise similar support apparatus, without evidence of pneumothorax.   Original Report Authenticated By: Jeronimo Greaves, M.D.   Dg Naso G Tube Plc W/fl-no Rad  12/28/2012   CLINICAL DATA: post pyloric feeding tube   NASO G TUBE PLACEMENT WITH FLUORO  Fluoroscopy was utilized by the requesting physician.  No radiographic  interpretation.      Medications:     Scheduled Medications: . antiseptic oral rinse  15 mL Mouth Rinse QID  . aspirin  324 mg Per Tube Daily  . aspirin  300 mg Rectal Daily  . bisacodyl  10 mg Oral Daily   Or  . bisacodyl  10 mg Rectal Daily  . chlorhexidine  15 mL Mouth Rinse BID  . docusate  200 mg Per Tube Daily  . feeding supplement  60 mL Oral BID  . feeding supplement (VITAL 1.5 CAL)  40 mL/hr Per Tube Q24H  . fluconazole (DIFLUCAN) IV  100 mg Intravenous Daily  . imipenem-cilastatin  500 mg Intravenous Q6H  . insulin aspart  0-24 Units Subcutaneous Q4H  . levalbuterol  1.25 mg Nebulization Q6H  . magic mouthwash  5 mL Oral QID  .  metolazone  5 mg Per Tube Daily  . potassium chloride  10 mEq Intravenous Q1 Hr x 2  . sodium chloride  10 mL Intravenous Q12H  . vancomycin  1,500 mg Intravenous Q18H  . warfarin  5 mg Oral Daily  . Warfarin - Physician Dosing Inpatient   Does not apply q1800    Infusions: . sodium chloride 20 mL/hr (12/28/12 1842)  . sodium chloride 20 mL/hr at 12/29/12 0417  . sodium chloride Stopped (12/28/12 0000)  . sodium chloride 30 mL/hr at 12/28/12 2000  . amiodarone (NEXTERONE PREMIX) 360 mg/200 mL dextrose 30 mg/hr (12/29/12 0013)  . bivalirudin (ANGIOMAX) infusion 0.5 mg/mL (Non-ACS indications) 0.12 mg/kg/hr (12/28/12 1902)  . dexmedetomidine 0.6 mcg/kg/hr (12/29/12 0443)  . DOBUTamine 4 mcg/kg/min (12/29/12 0417)  . DOPamine 4 mcg/kg/min (12/28/12 0141)  . epinephrine 3 mcg/min (12/28/12 1900)  . fentaNYL infusion INTRAVENOUS 125 mcg/hr (12/29/12 0300)  .  furosemide (LASIX) infusion 8 mg/hr (12/28/12 0833)  . lactated ringers Stopped (12/28/12 0000)  . midazolam (VERSED) infusion 4 mg/hr (12/29/12 1610)  . nitroGLYCERIN Stopped (12/18/12 1705)  . norepinephrine (LEVOPHED) Adult infusion 6 mcg/min (12/28/12 2340)  . pantoprozole (PROTONIX) infusion 8 mg/hr (12/29/12 0012)  . phenylephrine (NEO-SYNEPHRINE) Adult infusion Stopped (12/24/12 0000)    PRN Medications: acetaminophen (TYLENOL) oral liquid 160 mg/5 mL, diphenhydrAMINE, fentaNYL, midazolam, morphine injection, ondansetron (ZOFRAN) IV, ondansetron (ZOFRAN) IV, potassium chloride, sodium chloride, sodium chloride   Assessment:   1. Acute on chronic systolic HF with biventricular HF EF 10%  --S/p HM II VAD with TV repair 12/18/12  --s/p RVAD. - explanted 8/25 2. 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 Cardiogenic shock  4. VT s/p Biotronik ICD on amio 5. Chronic renal failure, stage III 6. 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 ABL anemia  8. PNA 9. Severe protein calorie malnutrition - on TPN 10. Hyponatremia 11. GI bleeding with [place] Artist tear  Plan/Discussion:    She is improving steadily. RV holding its own. She is markedly volume overloaded. Continue diuresis.  Agree with zaroxolyn.  Wean inotropes slowly as tolerated.   The patient is critically ill with multiple organ systems failure and requires high complexity decision making for assessment and support, frequent evaluation and titration of therapies, application of advanced monitoring technologies and extensive interpretation of multiple databases.   Critical Care Time devoted to patient care services described in this note is 40 Minutes.  Faline Langer,MD 9:05 AM   Length of Stay: 21 VAD Team Pager [phone] (7am - 7am) VAD Issue  Non-VAD issues HF Pager [phone]

## 2012-12-29 NOTE — Op Note (Signed)
NAMEDELAINEY, Kaitlin [name] NO.:  [id number]  MEDICAL RECORD NO.:  [id number]  LOCATION:  2S10C                        FACILITY:  [facility]  PHYSICIAN:  Kerin Perna, M.D.  DATE OF BIRTH:  12-May-1945  DATE OF PROCEDURE:  12/27/2012 DATE OF DISCHARGE:                              OPERATIVE REPORT   OPERATION:  Sternal closure.  PREOPERATIVE DIAGNOSIS:  Status post implantation of HeartMate II left ventricular assist device for end-stage heart failure with subsequent RV dysfunction and sternal non closure for maintenance of hemodynamics.  POSTOPERATIVE DIAGNOSIS:  Status post implantation of HeartMate II left ventricular assist device for end-stage heart failure with subsequent RV dysfunction and sternal non closure for maintenance of hemodynamics.  SURGEON:  Kerin Perna, M.D.  ASSISTANT:  Wardell Honour, RNFA  ANESTHESIA:  General by Dr. Achille Rich.  INDICATIONS:  The patient 48 hours previously had a temporary right ventricular assist device removed and had stable 48 hours with weaning of drips and significant diuresis.  She was brought back to the operating room for sternal washout, change of her wound VAC, and possible sternal closure.  Prior to surgery, I discussed the procedure in detail with the patient's family and informed consent was obtained.  OPERATIVE PROCEDURE:  The patient was brought directly from the ICU back to the operating room on the ventilator and all of her intensive care equipment and monitors.  The patient was placed supine on the operating table.  A transesophageal echo probe was not placed due to the patient's recent diagnosis of a Mallory-Weiss GE junction tear and upper GI bleed. The previously placed wound VAC was removed.  The patient's chest, abdomen, and groins were prepped and draped including the chest tubes which were prepped into the field.  The patient was then prepped and draped.  A proper time-out was performed.  The  wound was inspected.  It was clean.  There was minimal hematoma or blood in the mediastinum.  The right pleural chest tube was removed to allow more room in the anterior mediastinum and to allow the outflow graft of the HeartMate II to be laterally placed to avoid compression of the right ventricle.  The wound was irrigated with vancomycin irrigation and also the pulse lavage irrigator was used to cleanse the subcutaneous fat and sternal bone.  We then placed sternal wires.  The sternal wires were then slowly drawn together.  Hemodynamics were carefully checked including cardiac output, CVP, pulsatile index on the LVAD, systemic blood pressure, and coag central venous saturations.  We increased the inotropic support to the right heart slightly by starting low-dose dobutamine and we were able to successfully bring the sternum completely together and twist the wires. The patient was observed for some considerable period of time and remained hemodynamically stable.  The wound was then irrigated again. The deep fascia and pectoralis fascia was closed with interrupted Vicryl.  The subcutaneous layer was closed in running 2-0 Vicryl.  The skin was closed with an interrupted 3-0 nylon closure.  The skin was then painted with ChloraPrep and a compartmentalized dressing - Aquacell was then placed on the sternotomy.  The chest tubes were continued to their Pleur-Evac drainage systems and sterile  dressings were applied around the chest tubes.  The patient was then returned to the ICU in critical but stable condition.     Kerin Perna, M.D.     PV/MEDQ  D:  12/28/2012  T:  12/29/2012  Job:  [id number]

## 2012-12-29 NOTE — Progress Notes (Signed)
CRITICAL VALUE ALERT  Critical value received:  Potassium 2.6    Date of notification:  12/29/12  Time of notification:  1745  Critical value read back: yes  Nurse who received alert: Darnelle Going   MD notified (1st page):  Dr. Donata Clay  Time of first page:  1745  Responding MD:  Dr. Donata Clay  Time MD responded:  [phone]

## 2012-12-29 NOTE — Progress Notes (Signed)
Anesthesiology Follow-up:  Patient remains sedated on vent, tolerating chest closure, fluid overload as noted treated with lasix drip.  She remains on Dopamine 4 mcg./kg./min, Epinephrine 3 mcg./min, Dobutamine 4 mcg/kg/min for hemodynamic support.  VAD Parameters: 8390 rpm, flow-3.0 lpm, Power 3 watts, PI 4.1  VS: T- 36.9 BP 89/77 HR 97 (SR)    FiO2 .50 PRVC 450 RR 12 PEEP 5   PH 7.45 Po2 99 PCO2 32  Nitric Oxide 25 ppm  H/H- 8.6/24 Plts 347,000 Na 133 K- 2.8 BUN/Cr. 21/0.79   Patient is making  slow, but steady  progress. Plan to continue diuresis and wean pressors and nitric oxide as tolerated  Kaitlin [name]

## 2012-12-29 NOTE — Progress Notes (Signed)
NUTRITION FOLLOW-UP  DOCUMENTATION CODES Per approved criteria  -Not Applicable   INTERVENTION: Continue Vital 1.5 at 40 ml/h with 60 ml Prostat liquid protein BID. Goal regimen provides 1840 kcal, 125 grams protein, 733 ml free water and is currently meeting 100% of estimated protein and calorie needs. RD to continue to follow nutrition care plan.  NUTRITION DIAGNOSIS: Inadequate oral intake now related to inability to eat as evidenced by NPO status. Ongoing.  Goal: Intake to meet >90% of estimated nutrition needs. Met.  Monitor:  Vent settings, labs, weight trends, post-op healing and recovery, tolerance of nutrition support  ASSESSMENT: LVAD inserted on 8/18. Pt found to have R ventricular dysfunction s/p LVAD implantation, required trip to OR later that evening for placement of RVAD. RVAD removed 8/25. Underwent sternotomy incision closure and wound VAC wash out 8/26.   Underwent endoscopy on 8/26 - pt found to have Mallory Weiss tear, which was clipped by GI. Post-pyloric tube placed by radiology with the tip at the ligament of Treitz. Vital 1.5 started on 8/28.  Pt is currently receiving Vital 1.5 at 40 ml/hr (goal rate) with 60 ml Prostat liquid protein BID. This regimen provides: 1840 kcal, 125 grams protein, 733 ml free water. Pt with loose stools.  Remains intubated on ventilator support.  MV: 9.3 L/min Temp:Temp (24hrs), Avg:99.9 F (37.7 C), Min:98.4 F (36.9 C), Max:101.1 F (38.4 C)  Propofol: none  Noted potassium is trending down.  Height: Ht Readings from Last 1 Encounters:  12/22/12 5\' 9"  (1.753 m)    Weight: Wt Readings from Last 1 Encounters:  12/29/12 201 lb 1 oz (91.2 kg)  Admission wt: 165 lb Current wt is up 36 lb.  BMI:  Body mass index is 29.3 kg/(m^2) - using admission weight.  Estimated Nutritional Needs: Kcal: 1798 Protein: 120 - 140 gm Fluid: 1.5 - 2 L  Skin:  Abdomen incision Chest incision  Diet Order: NPO  EDUCATION  NEEDS: -Education needs addressed. Provided many handouts on heart healthy eating, including label reading tips per patient request on 8/15.   Intake/Output Summary (Last 24 hours) at 12/29/12 1227 Last data filed at 12/29/12 1200  Gross per 24 hour  Intake 5529.33 ml  Output   5335 ml  Net 194.33 ml    Last BM: 8/29  Labs:   Recent Labs Lab 12/24/12 0500  12/25/12 0339  12/26/12 0355  12/28/12 0428 12/28/12 1700 12/28/12 1732 12/28/12 1749 12/29/12 0435  NA 130*  < > 131*  < > 130*  < > 129* 131* 132* 132* 133*  K 4.1  < > 3.8  < > 5.2*  < > 3.6 3.0* 2.9* 2.9* 2.8*  CL 94*  < > 97  < > 98  < > 96 100 102 101 100  CO2 27  < > 31  < > 23  < > 21 19  --   --  22  BUN 13  < > 17  < > 24*  < > 20 21 19 20 21   CREATININE <0.20*  < > 0.72  < > 0.84  < > 0.81 0.77 1.00 0.80 0.79  CALCIUM 8.1*  < > 8.1*  < > 8.5  < > 8.4 8.2*  --   --  8.0*  MG 1.8  --  2.1  --  1.8  --   --   --   --   --   --   PHOS 1.4*  --  1.9*  --  2.9  --   --   --   --   --   --   GLUCOSE 301*  < > 142*  < > 142*  < > 144* 152* 148* 147* 161*  < > = values in this interval not displayed.  CBG (last 3)   Recent Labs  12/29/12 0435 12/29/12 0818 12/29/12 1150  GLUCAP 157* 142* 172*   Prealbumin  Date/Time Value Range Status  12/25/2012  3:39 AM 5.9* 17.0 - 34.0 mg/dL Final     Performed at [facility]   Triglycerides  Date/Time Value Range Status  12/25/2012  3:39 AM 159* <150 mg/dL Final    Scheduled Meds: . antiseptic oral rinse  15 mL Mouth Rinse QID  . aspirin  324 mg Per Tube Daily  . aspirin  300 mg Rectal Daily  . bisacodyl  10 mg Oral Daily   Or  . bisacodyl  10 mg Rectal Daily  . chlorhexidine  15 mL Mouth Rinse BID  . docusate  200 mg Per Tube Daily  . feeding supplement  60 mL Oral BID  . feeding supplement (VITAL 1.5 CAL)  40 mL/hr Per Tube Q24H  . fluconazole (DIFLUCAN) IV  100 mg Intravenous Daily  . imipenem-cilastatin  500 mg Intravenous Q6H  . insulin aspart   0-24 Units Subcutaneous Q4H  . levalbuterol  1.25 mg Nebulization Q6H  . magic mouthwash  5 mL Oral QID  . metolazone  5 mg Per Tube Daily  . potassium chloride  10 mEq Intravenous Q1 Hr x 2  . sodium chloride  10 mL Intravenous Q12H  . vancomycin  1,500 mg Intravenous Q18H  . warfarin  5 mg Oral Daily  . Warfarin - Physician Dosing Inpatient   Does not apply q1800    Continuous Infusions: . sodium chloride 20 mL/hr at 12/29/12 0900  . sodium chloride 20 mL/hr at 12/29/12 0417  . sodium chloride Stopped (12/28/12 0000)  . sodium chloride 30 mL/hr at 12/28/12 2000  . amiodarone (NEXTERONE PREMIX) 360 mg/200 mL dextrose 30 mg/hr (12/29/12 1200)  . bivalirudin (ANGIOMAX) infusion 0.5 mg/mL (Non-ACS indications) 0.12 mg/kg/hr (12/29/12 1200)  . dexmedetomidine 0.6 mcg/kg/hr (12/29/12 1200)  . DOBUTamine 4 mcg/kg/min (12/29/12 1200)  . DOPamine 4 mcg/kg/min (12/29/12 1200)  . epinephrine 3 mcg/min (12/29/12 1200)  . fentaNYL infusion INTRAVENOUS 125 mcg/hr (12/29/12 1200)  . furosemide (LASIX) infusion 8 mg/hr (12/29/12 1200)  . lactated ringers Stopped (12/28/12 0000)  . midazolam (VERSED) infusion 4 mg/hr (12/29/12 1000)  . nitroGLYCERIN Stopped (12/18/12 1705)  . norepinephrine (LEVOPHED) Adult infusion 5.5 mcg/min (12/29/12 1200)  . pantoprozole (PROTONIX) infusion 8 mg/hr (12/29/12 1200)  . phenylephrine (NEO-SYNEPHRINE) Adult infusion Stopped (12/24/12 0000)    Jarold Motto MS, RD, LDN Pager: [phone] After-hours pager: [phone]

## 2012-12-30 ENCOUNTER — Inpatient Hospital Stay (HOSPITAL_COMMUNITY): Payer: Commercial Managed Care - PPO

## 2012-12-30 LAB — BASIC METABOLIC PANEL
BUN: 21 mg/dL (ref 6–23)
CO2: 30 mEq/L (ref 19–32)
Calcium: 8.9 mg/dL (ref 8.4–10.5)
Chloride: 98 mEq/L (ref 96–112)
Creatinine, Ser: 0.78 mg/dL (ref 0.50–1.10)
GFR calc Af Amer: >90 mL/min (ref >90)
GFR calc non Af Amer: 86 mL/min — ABNORMAL LOW (ref >90)
Glucose, Bld: 161 mg/dL — ABNORMAL HIGH (ref 70–99)
Potassium: 3.1 mEq/L — ABNORMAL LOW (ref 3.5–5.1)
Sodium: 137 mEq/L (ref 135–145)

## 2012-12-30 LAB — GLUCOSE, CAPILLARY
Glucose-Capillary: 124 mg/dL — ABNORMAL HIGH (ref 70–99)
Glucose-Capillary: 135 mg/dL — ABNORMAL HIGH (ref 70–99)
Glucose-Capillary: 147 mg/dL — ABNORMAL HIGH (ref 70–99)

## 2012-12-30 LAB — CARBOXYHEMOGLOBIN
Carboxyhemoglobin: 1.2 % (ref 0.5–1.5)
Methemoglobin: 1.4 % (ref 0.0–1.5)
O2 Saturation: 53.9 %
Total hemoglobin: 8.9 g/dL — ABNORMAL LOW (ref 12.0–16.0)

## 2012-12-30 LAB — POCT I-STAT 3, ART BLOOD GAS (G3+)
Acid-Base Excess: 3 mmol/L — ABNORMAL HIGH (ref 0.0–2.0)
O2 Saturation: 99 %
TCO2: 29 mmol/L (ref 0–100)
pCO2 arterial: 39.7 mmHg (ref 35.0–45.0)
pO2, Arterial: 125 mmHg — ABNORMAL HIGH (ref 80.0–100.0)

## 2012-12-30 LAB — CBC
HCT: 26.5 % — ABNORMAL LOW (ref 36.0–46.0)
Hemoglobin: 8.9 g/dL — ABNORMAL LOW (ref 12.0–15.0)
MCH: 28.9 pg (ref 26.0–34.0)
MCHC: 33.6 g/dL (ref 30.0–36.0)
MCV: 86 fL (ref 78.0–100.0)
Platelets: 447 10*3/uL — ABNORMAL HIGH (ref 150–400)
RBC: 3.08 MIL/uL — ABNORMAL LOW (ref 3.87–5.11)
RDW: 16.3 % — ABNORMAL HIGH (ref 11.5–15.5)
WBC: 22 10*3/uL — ABNORMAL HIGH (ref 4.0–10.5)

## 2012-12-30 LAB — PROTIME-INR
INR: 2.23 — ABNORMAL HIGH (ref 0.00–1.49)
Prothrombin Time: 24 seconds — ABNORMAL HIGH (ref 11.6–15.2)

## 2012-12-30 LAB — POCT I-STAT 4, (NA,K, GLUC, HGB,HCT)
Glucose, Bld: 167 mg/dL — ABNORMAL HIGH (ref 70–99)
HCT: 26 % — ABNORMAL LOW (ref 36.0–46.0)
Potassium: 2.8 mEq/L — ABNORMAL LOW (ref 3.5–5.1)

## 2012-12-30 LAB — COMPREHENSIVE METABOLIC PANEL
ALT: 33 U/L (ref 0–35)
AST: 44 U/L — ABNORMAL HIGH (ref 0–37)
Albumin: 2.2 g/dL — ABNORMAL LOW (ref 3.5–5.2)
Alkaline Phosphatase: 191 U/L — ABNORMAL HIGH (ref 39–117)
BUN: 20 mg/dL (ref 6–23)
CO2: 25 mEq/L (ref 19–32)
Calcium: 8.6 mg/dL (ref 8.4–10.5)
Chloride: 98 mEq/L (ref 96–112)
Creatinine, Ser: 0.79 mg/dL (ref 0.50–1.10)
GFR calc Af Amer: >90 mL/min (ref >90)
GFR calc non Af Amer: 86 mL/min — ABNORMAL LOW (ref >90)
Glucose, Bld: 199 mg/dL — ABNORMAL HIGH (ref 70–99)
Potassium: 3 mEq/L — ABNORMAL LOW (ref 3.5–5.1)
Sodium: 135 mEq/L (ref 135–145)
Total Bilirubin: 1.1 mg/dL (ref 0.3–1.2)
Total Protein: 5.2 g/dL — ABNORMAL LOW (ref 6.0–8.3)

## 2012-12-30 LAB — APTT
aPTT: 72 seconds — ABNORMAL HIGH (ref 24–37)
aPTT: 71 seconds — ABNORMAL HIGH (ref 24–37)
aPTT: 89 seconds — ABNORMAL HIGH (ref 24–37)

## 2012-12-30 LAB — LACTATE DEHYDROGENASE: LDH: 340 U/L — ABNORMAL HIGH (ref 94–250)

## 2012-12-30 LAB — PROCALCITONIN: Procalcitonin: 0.75 ng/mL

## 2012-12-30 MED ORDER — WARFARIN - PHYSICIAN DOSING INPATIENT
Freq: Every day | Status: DC
  Administered 2012-12-30 – 2013-01-01 (×3)

## 2012-12-30 MED ORDER — POTASSIUM CHLORIDE 10 MEQ/50ML IV SOLN
10.0000 meq | INTRAVENOUS | Status: AC
  Administered 2012-12-30 (×2): 10 meq via INTRAVENOUS
  Filled 2012-12-30: qty 200

## 2012-12-30 MED ORDER — WARFARIN SODIUM 2.5 MG PO TABS
2.5000 mg | ORAL_TABLET | Freq: Once | ORAL | Status: AC
  Administered 2012-12-30: 2.5 mg via ORAL
  Filled 2012-12-30 (×2): qty 1

## 2012-12-30 MED ORDER — POTASSIUM CHLORIDE 10 MEQ/50ML IV SOLN
10.0000 meq | INTRAVENOUS | Status: AC
  Administered 2012-12-30 (×4): 10 meq via INTRAVENOUS
  Filled 2012-12-30: qty 200

## 2012-12-30 MED ORDER — AMIODARONE LOAD VIA INFUSION
150.0000 mg | Freq: Once | INTRAVENOUS | Status: DC
  Filled 2012-12-30: qty 83.34

## 2012-12-30 MED ORDER — FUROSEMIDE 10 MG/ML IJ SOLN: 6.0000 mg/h | INTRAVENOUS | Status: DC

## 2012-12-30 MED ORDER — POTASSIUM CHLORIDE 10 MEQ/50ML IV SOLN
10.0000 meq | INTRAVENOUS | Status: AC
  Administered 2012-12-30 (×5): 10 meq via INTRAVENOUS
  Filled 2012-12-30: qty 250

## 2012-12-30 MED ORDER — AMIODARONE IV BOLUS ONLY 150 MG/100ML: 150.0000 mg | Freq: Once | INTRAVENOUS | Status: DC

## 2012-12-30 MED ORDER — AMIODARONE HCL 200 MG PO TABS
200.0000 mg | ORAL_TABLET | Freq: Two times a day (BID) | ORAL | Status: DC
  Administered 2012-12-30 – 2013-01-14 (×32): 200 mg via NASOGASTRIC
  Filled 2012-12-30 (×33): qty 1

## 2012-12-30 MED ORDER — POTASSIUM CHLORIDE 10 MEQ/50ML IV SOLN
10.0000 meq | INTRAVENOUS | Status: AC
  Administered 2012-12-30 (×4): 10 meq via INTRAVENOUS
  Filled 2012-12-30: qty 150

## 2012-12-30 NOTE — Progress Notes (Signed)
HeartMate 2 Rounding Note  Subjective:    Ms. 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 EF 15-25% with mild to moderate RV dysfunction  Underwent HM II LVAD implant (under destination criteria) along with TV repair on 8/18.  She was taken back to the OR for RVAD support.   On 8/25 was taken back to OR for RVAD explant.  8/26 EGD which showed MW tear in esophagus. Injected and clipped. No further bleeding.   8/27  Back to OR for chest closure.   Epi now down to 2. Weaning NO. Good diuresis yesterday on IV lasix and metolazone. Weight down 5 pounds. CVP 15. Sedated but arouses on vent. On bivalirudin. K low. WBC decreasing   LVAD INTERROGATION:   HeartMate II LVAD:  Flow --  Liters/min 3.4, speed 8390  power 4.0, PI 5.7  No significant alarms  Objective:    Vital Signs:   Temp:  [98.1 F (36.7 C)-99.9 F (37.7 C)] 99.7 F (37.6 C) (08/30 1000) Pulse Rate:  [29-103] 100 (08/30 1000) Resp:  [14-27] 17 (08/30 1000) BP: (76-121)/(66-94) 91/74 mmHg (08/30 0747) SpO2:  [98 %-100 %] 100 % (08/30 1000) FiO2 (%):  [50 %] 50 % (08/30 0747) Weight:  [89.3 kg (196 lb 13.9 oz)] 89.3 kg (196 lb 13.9 oz) (08/30 0600) Last BM Date: 12/30/12 Mean arterial Pressure 60-70s  Intake/Output:   Intake/Output Summary (Last 24 hours) at 12/30/12 1125 Last data filed at 12/30/12 1000  Gross per 24 hour  Intake 6124.24 ml  Output  78469 ml  Net -4440.76 ml     Physical Exam: General: Intubated and sedated,  HEENT: normal Neck: supple. CVP 15. Carotids 2+ bilat; no bruits. No lymphadenopathy or thryomegaly appreciated.  Cor: Mechanical heart sounds with LVAD hum present.;Lungs: course Abdomen: soft, nontender, mildly distended. Hypoactive BS Driveline: C/D/I; securement device intact  Extremities: no cyanosis, clubbing, rash, 2+ edema.  Neuro: intubated sedated, cranial nerves grossly intact. moves all 4 extremities w/o difficulty.  Affect pleasant  Telemetry: AV paced 80s  Labs: Basic Metabolic Panel:  Recent Labs Lab 12/24/12 0500  12/25/12 0339  12/26/12 0355  12/28/12 0428 12/28/12 1700 12/28/12 1732 12/28/12 1749 12/29/12 0435 12/29/12 1600 12/30/12 0109 12/30/12 0425  NA 130*  < > 131*  < > 130*  < > 129* 131* 132* 132* 133* 134* 138 135  K 4.1  < > 3.8  < > 5.2*  < > 3.6 3.0* 2.9* 2.9* 2.8* 2.6* 2.8* 3.0*  CL 94*  < > 97  < > 98  < > 96 100 102 101 100 100  --  98  CO2 27  < > 31  < > 23  < > 21 19  --   --  22 22  --  25  GLUCOSE 301*  < > 142*  < > 142*  < > 144* 152* 148* 147* 161* 176* 167* 199*  BUN 13  < > 17  < > 24*  < > 20 21 19 20 21 21   --  20  CREATININE <0.20*  < > 0.72  < > 0.84  < > 0.81 0.77 1.00 0.80 0.79 0.82  --  0.79  CALCIUM 8.1*  < > 8.1*  < > 8.5  < > 8.4 8.2*  --   --  8.0* 8.5  --  8.6  MG 1.8  --  2.1  --  1.8  --   --   --   --   --   --   --   --   --   PHOS 1.4*  --  1.9*  --  2.9  --   --   --   --   --   --   --   --   --   < > = values in this interval not displayed.  Liver Function Tests:  Recent Labs Lab 12/26/12 0355 12/27/12 0450 12/28/12 0428 12/29/12 0435 12/30/12 0425  AST 26 23 26  35 44*  ALT 11 11 13 20  33  ALKPHOS 78 120* 145* 163* 191*  BILITOT 2.0* 1.5* 2.0* 1.6* 1.1  PROT 4.8* 4.7* 5.0* 5.2* 5.2*  ALBUMIN 2.8* 2.2* 2.5* 2.1* 2.2*   No results found for this basename: LIPASE, AMYLASE,  in the last 168 hours No results found for this basename: AMMONIA,  in the last 168 hours  CBC:  Recent Labs Lab 12/24/12 0500  12/25/12 0339  12/28/12 0428 12/28/12 1727  12/28/12 1749 12/29/12 0435 12/29/12 1600 12/30/12 0109 12/30/12 0425  WBC 17.2*  --  19.3*  < > 31.3* 26.2*  --   --  23.2* 23.6*  --  22.0*  NEUTROABS 12.9*  --  15.1*  --   --   --   --   --   --   --   --   --   HGB 7.5*  < > 7.3*  < > 9.0* 8.9*  < > 8.8* 8.6* 9.2* 8.8* 8.9*  HCT 22.5*  < > 21.3*  < > 25.9* 25.4*  < > 26.0* 24.7* 26.5* 26.0* 26.5*  MCV 87.5  --  87.3  < >  84.6 84.9  --   --  85.8 86.6  --  86.0  PLT 158  --  178  < > 318 342  --   --  347 403*  --  447*  < > = values in this interval not displayed.  INR:  Recent Labs Lab 12/26/12 0355 12/29/12 1349 12/30/12 0425  INR 1.25 1.85* 2.23*    Imaging: Dg Chest Port 1 View  12/30/2012   *RADIOLOGY REPORT*  Clinical Data: 67 year old female - status post implantable left ventricular assist device.  PORTABLE CHEST - 1 VIEW  Comparison: 12/29/2012 and prior chest radiographs  Findings: An endotracheal tube is present with tip at the carina - recommend 1-2 cm retraction. Left ventricular assist device is unchanged. A left-sided pacemaker, Swan-Ganz catheter, right subclavian central venous catheter and small bore feeding tube are stable. Bilateral atelectasis is unchanged. There is no evidence of pneumothorax or large pleural effusion.  IMPRESSION: Endotracheal tube with tip at the carina - recommend 1-2 cm retraction.  Otherwise stable chest as described.  These results were called to Liborio Nixon, RN on 12/30/2012 at 7:50 AM.   Original Report Authenticated By: Harmon Pier, M.D.   Dg Chest Port 1 View  12/29/2012   *RADIOLOGY REPORT*  Clinical Data: Ventricular assistance device.  PORTABLE CHEST - 1 VIEW  Comparison: December 28, 2012.  Findings: Stable cardiomegaly.  Left-sided defibrillator is unchanged.  Distal tip of left internal jugular Swan-Ganz catheter is in expected position of main pulmonary artery.  Right subclavian catheter is unchanged in position. Left-sided chest tube is unchanged without evidence of pneumothorax.  The right midlung opacity is slightly improved compared to prior exam consistent with pneumonia or atelectasis.  Left ventricular assistance device is again  noted and unchanged in position.  IMPRESSION: Stable support apparatus.  Slightly improved right midlung opacity consistent with pneumonia or subsegmental atelectasis.   Original Report Authenticated By: Lupita Raider.,  M.D.   Dg  Chest Port 1 View  12/28/2012   *RADIOLOGY REPORT*  Clinical Data:  ventricular assist device,  PORTABLE CHEST - 1 VIEW  Comparison: 12/28/2012  Findings: Cardiomegaly again noted.  Triangular shaped airspace disease and right midlung is stable.  Endotracheal tube is unchanged in position.  Stable left Swan-Ganz catheter position. Ventricular assist device is unchanged in position.  A single lead cardiac pacemaker is unchanged in position.  Stable small left pleural effusion with left lower lobe atelectasis or infiltrate. Left-sided chest tube is unchanged in position. Status post median sternotomy. No diagnostic pneumothorax.  IMPRESSION: Stable support apparatus. Stable opacity in the right midlung. Again noted small left pleural effusion with left lower lobe atelectasis or infiltrate.  Left chest tube is unchanged in position.  No diagnostic pneumothorax.   Original Report Authenticated By: Natasha Mead, M.D.     Medications:     Scheduled Medications: . amiodarone  150 mg Intravenous Once  . amiodarone  200 mg Per NG tube BID  . antiseptic oral rinse  15 mL Mouth Rinse QID  . aspirin  324 mg Per Tube Daily  . aspirin  300 mg Rectal Daily  . bisacodyl  10 mg Oral Daily   Or  . bisacodyl  10 mg Rectal Daily  . chlorhexidine  15 mL Mouth Rinse BID  . docusate  200 mg Per Tube Daily  . feeding supplement  60 mL Oral BID  . feeding supplement (VITAL 1.5 CAL)  40 mL/hr Per Tube Q24H  . imipenem-cilastatin  500 mg Intravenous Q6H  . insulin aspart  0-24 Units Subcutaneous Q4H  . levalbuterol  1.25 mg Nebulization Q6H  . magic mouthwash  5 mL Oral QID  . metoCLOPramide (REGLAN) injection  10 mg Intravenous Q6H  . metolazone  5 mg Per Tube Daily  . pantoprazole sodium  40 mg Per Tube BID  . potassium chloride  10 mEq Intravenous Q1 Hr x 5  . sodium chloride  10 mL Intravenous Q12H  . vancomycin  1,500 mg Intravenous Q18H  . warfarin  2.5 mg Oral ONCE-1800  . Warfarin - Physician Dosing  Inpatient   Does not apply q1800    Infusions: . sodium chloride 20 mL/hr at 12/29/12 0900  . sodium chloride 20 mL/hr (12/30/12 0544)  . sodium chloride Stopped (12/28/12 0000)  . sodium chloride 30 mL/hr at 12/28/12 2000  . bivalirudin (ANGIOMAX) infusion 0.5 mg/mL (Non-ACS indications) 0.12 mg/kg/hr (12/29/12 2110)  . dexmedetomidine 0.4 mcg/kg/hr (12/30/12 0900)  . DOBUTamine 4 mcg/kg/min (12/29/12 1900)  . DOPamine 3 mcg/kg/min (12/30/12 0749)  . epinephrine 2 mcg/min (12/30/12 0900)  . fentaNYL infusion INTRAVENOUS 125 mcg/hr (12/29/12 1900)  . furosemide (LASIX) infusion 8 mg/hr (12/29/12 1900)  . lactated ringers Stopped (12/28/12 0000)  . midazolam (VERSED) infusion 3 mg/hr (12/30/12 0503)  . nitroGLYCERIN Stopped (12/18/12 1705)  . norepinephrine (LEVOPHED) Adult infusion 4 mcg/min (12/30/12 0445)  . phenylephrine (NEO-SYNEPHRINE) Adult infusion Stopped (12/24/12 0000)    PRN Medications: acetaminophen (TYLENOL) oral liquid 160 mg/5 mL, diphenhydrAMINE, fentaNYL, midazolam, morphine injection, ondansetron (ZOFRAN) IV, ondansetron (ZOFRAN) IV, sodium chloride, sodium chloride   Assessment:   1. Acute on chronic systolic HF with biventricular HF EF 10%  --S/p HM II VAD with TV repair 12/18/12  --s/p RVAD. -  explanted 8/25 2. 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 Cardiogenic shock  4. VT s/p Biotronik ICD on amio 5. Chronic renal failure, stage III 6. 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 ABL anemia  8. PNA 9. Severe protein calorie malnutrition - on TPN 10. Hyponatremia 11. GI bleeding with [place] Artist tear  Plan/Discussion:    She is improving steadily. RV holding its own. Continue to wean inotropes and diurese with lasix and metolazone. Supp K+ aggressively.  Continue abx and anticoagulation. VAD parameters look god.   Hopefully we can wean vent soon otherwise we will need to consider trach,   The patient is critically ill  with multiple organ systems failure and requires high complexity decision making for assessment and support, frequent evaluation and titration of therapies, application of advanced monitoring technologies and extensive interpretation of multiple databases.   Critical Care Time devoted to patient care services described in this note is 40 Minutes.  Kamori Kitchens,MD 11:25 AM   Length of Stay: 22 VAD Team Pager [phone] (7am - 7am) VAD Issue  Non-VAD issues HF Pager [phone]

## 2012-12-30 NOTE — Progress Notes (Signed)
. HeartMate 2 Rounding Note  Subjective:   Postop day #12   HeartMate 2 implantation with subsequent emergency centri- mag RVAD because of  RV dysfunction. Patient with stable hemodynamics on BiVAD support.Returned to OR 8-25 for successful removal of RVAD Responsive but comfortable with IV sedation.  Had successful delayed sternal closure  8-28-  Patient continues to have stable hemodynamics after chest closure and we are slowly weaning inotropes and inhaled nitric oxide. Patient has significant fluid overload in his diureses well on Lasix drip CVP remains 15, LVAD flow rates 3.3 L per minute, cardiac index 2.0   Patient was placed on bivalirudin yesterday due to heparin resistance and dose currently is 0.12 mg per kilogram per minute with PTT of 75  Patient has been dosed with Coumadin 5 mg twice via feeding tube and last INR 2.2 with goal 2.5  UGI bleed from M-W tear- now inactive, on iv protonix  Feeding tube post-pyloric and Vital 1.5 started--rectal tube placed for diarrhea   diuresis for fluid overload- lasix drip  WBC >30k> 25>20>18 with procalcitonin 1.3>1.0 on Vanc, Primaxin and short course of IV Diflucan  Patient has been sedated but will react and followed commands appropriately when her sedation is lightened. She remains on the ventilator in order to provide inhaled nitric oxide which he needs for significant RV dysfunction which is now improving. Weaning nitric oxide slowly and then extubation- Nitric oxide wean to 15 PPM at 6pm   LVAD INTERROGATION:  HeartMate II LVAD:  Flow 3.1 liters/min, speed 8400 power 3.2, PI 4.8.   Objective:    Vital Signs:   Temp:  [98.1 F (36.7 C)-99.9 F (37.7 C)] 99.5 F (37.5 C) (08/30 0700) Pulse Rate:  [29-103] 101 (08/30 0700) Resp:  [14-27] 19 (08/30 0700) BP: (76-121)/(66-94) 86/70 mmHg (08/30 0401) SpO2:  [97 %-100 %] 100 % (08/30 0747) FiO2 (%):  [50 %] 50 % (08/30 0747) Weight:  [196 lb 13.9 oz (89.3 kg)] 196 lb 13.9 oz  (89.3 kg) (08/30 0600) Last BM Date: 12/29/12 Mean arterial Pressure 84  Intake/Output:   Intake/Output Summary (Last 24 hours) at 12/30/12 0804 Last data filed at 12/30/12 0749  Gross per 24 hour  Intake 5190.46 ml  Output  16109 ml  Net -5124.54 ml     Physical Exam: General: Intubated in ICU on right ventricular assist device, sedated and comfortable HEENT: normal Neck: supple. JVP normal  No lymphadenopathy or thryomegaly appreciated. Cor: Mechanical heart sounds with LVAD hum present. Lungs: clear Abdomen: soft, nontender, nondistended. No hepatosplenomegaly. No bruits or masses. Good bowel sounds. Extremities: no cyanosis, clubbing, rash, edema extremities warm Neuro: moves all 4 extremities but now is on sedation protocol while on ventilator  Telemetry: AV paced 90 per minute  Labs: Basic Metabolic Panel:  Recent Labs Lab 12/24/12 0500  12/25/12 0339  12/26/12 0355  12/28/12 0428 12/28/12 1700 12/28/12 1732 12/28/12 1749 12/29/12 0435 12/29/12 1600 12/30/12 0109 12/30/12 0425  NA 130*  < > 131*  < > 130*  < > 129* 131* 132* 132* 133* 134* 138 135  K 4.1  < > 3.8  < > 5.2*  < > 3.6 3.0* 2.9* 2.9* 2.8* 2.6* 2.8* 3.0*  CL 94*  < > 97  < > 98  < > 96 100 102 101 100 100  --  98  CO2 27  < > 31  < > 23  < > 21 19  --   --  22 22  --  25  GLUCOSE 301*  < > 142*  < > 142*  < > 144* 152* 148* 147* 161* 176* 167* 199*  BUN 13  < > 17  < > 24*  < > 20 21 19 20 21 21   --  20  CREATININE <0.20*  < > 0.72  < > 0.84  < > 0.81 0.77 1.00 0.80 0.79 0.82  --  0.79  CALCIUM 8.1*  < > 8.1*  < > 8.5  < > 8.4 8.2*  --   --  8.0* 8.5  --  8.6  MG 1.8  --  2.1  --  1.8  --   --   --   --   --   --   --   --   --   PHOS 1.4*  --  1.9*  --  2.9  --   --   --   --   --   --   --   --   --   < > = values in this interval not displayed.  Liver Function Tests:  Recent Labs Lab 12/26/12 0355 12/27/12 0450 12/28/12 0428 12/29/12 0435 12/30/12 0425  AST 26 23 26  35 44*  ALT 11  11 13 20  33  ALKPHOS 78 120* 145* 163* 191*  BILITOT 2.0* 1.5* 2.0* 1.6* 1.1  PROT 4.8* 4.7* 5.0* 5.2* 5.2*  ALBUMIN 2.8* 2.2* 2.5* 2.1* 2.2*   No results found for this basename: LIPASE, AMYLASE,  in the last 168 hours No results found for this basename: AMMONIA,  in the last 168 hours  CBC:  Recent Labs Lab 12/24/12 0500  12/25/12 0339  12/28/12 0428 12/28/12 1727  12/28/12 1749 12/29/12 0435 12/29/12 1600 12/30/12 0109 12/30/12 0425  WBC 17.2*  --  19.3*  < > 31.3* 26.2*  --   --  23.2* 23.6*  --  22.0*  NEUTROABS 12.9*  --  15.1*  --   --   --   --   --   --   --   --   --   HGB 7.5*  < > 7.3*  < > 9.0* 8.9*  < > 8.8* 8.6* 9.2* 8.8* 8.9*  HCT 22.5*  < > 21.3*  < > 25.9* 25.4*  < > 26.0* 24.7* 26.5* 26.0* 26.5*  MCV 87.5  --  87.3  < > 84.6 84.9  --   --  85.8 86.6  --  86.0  PLT 158  --  178  < > 318 342  --   --  347 403*  --  447*  < > = values in this interval not displayed.  INR:  Recent Labs Lab 12/26/12 0355 12/29/12 1349 12/30/12 0425  INR 1.25 1.85* 2.23*    Other results:  EKG:   Imaging: Dg Abd 1 View  12/28/2012   *RADIOLOGY REPORT*  Clinical Data: Feeding tube placement.  ABDOMEN - 1 VIEW  Comparison: None.  Findings: Feeding tube was placed under fluoroscopy by Vanita Ingles, Radiology Technologist with the tip at the ligament of Treitz.  5 minutes, 26 seconds of fluoroscopy time was used in conjunction with the procedure.  Position of the tip was confirmed by injecting contrast material.  IMPRESSION: Feeding tube tip in good position at the ligament of Treitz.   Original Report Authenticated By: Holley Dexter, M.D.   Dg Chest Port 1 View  12/30/2012   *RADIOLOGY REPORT*  Clinical Data: 67 year old female - status post implantable  left ventricular assist device.  PORTABLE CHEST - 1 VIEW  Comparison: 12/29/2012 and prior chest radiographs  Findings: An endotracheal tube is present with tip at the carina - recommend 1-2 cm retraction. Left ventricular  assist device is unchanged. A left-sided pacemaker, Swan-Ganz catheter, right subclavian central venous catheter and small bore feeding tube are stable. Bilateral atelectasis is unchanged. There is no evidence of pneumothorax or large pleural effusion.  IMPRESSION: Endotracheal tube with tip at the carina - recommend 1-2 cm retraction.  Otherwise stable chest as described.  These results were called to Liborio Nixon, RN on 12/30/2012 at 7:50 AM.   Original Report Authenticated By: Harmon Pier, M.D.   Dg Chest Port 1 View  12/29/2012   *RADIOLOGY REPORT*  Clinical Data: Ventricular assistance device.  PORTABLE CHEST - 1 VIEW  Comparison: December 28, 2012.  Findings: Stable cardiomegaly.  Left-sided defibrillator is unchanged.  Distal tip of left internal jugular Swan-Ganz catheter is in expected position of main pulmonary artery.  Right subclavian catheter is unchanged in position. Left-sided chest tube is unchanged without evidence of pneumothorax.  The right midlung opacity is slightly improved compared to prior exam consistent with pneumonia or atelectasis.  Left ventricular assistance device is again noted and unchanged in position.  IMPRESSION: Stable support apparatus.  Slightly improved right midlung opacity consistent with pneumonia or subsegmental atelectasis.   Original Report Authenticated By: Lupita Raider.,  M.D.   Dg Chest Port 1 View  12/28/2012   *RADIOLOGY REPORT*  Clinical Data:  ventricular assist device,  PORTABLE CHEST - 1 VIEW  Comparison: 12/28/2012  Findings: Cardiomegaly again noted.  Triangular shaped airspace disease and right midlung is stable.  Endotracheal tube is unchanged in position.  Stable left Swan-Ganz catheter position. Ventricular assist device is unchanged in position.  A single lead cardiac pacemaker is unchanged in position.  Stable small left pleural effusion with left lower lobe atelectasis or infiltrate. Left-sided chest tube is unchanged in position. Status post median  sternotomy. No diagnostic pneumothorax.  IMPRESSION: Stable support apparatus. Stable opacity in the right midlung. Again noted small left pleural effusion with left lower lobe atelectasis or infiltrate.  Left chest tube is unchanged in position.  No diagnostic pneumothorax.   Original Report Authenticated By: Natasha Mead, M.D.   Dg Naso G Tube Plc W/fl-no Rad  12/28/2012   CLINICAL DATA: post pyloric feeding tube   NASO G TUBE PLACEMENT WITH FLUORO  Fluoroscopy was utilized by the requesting physician.  No radiographic  interpretation.      Medications:     Scheduled Medications: . amiodarone  150 mg Intravenous Once  . amiodarone  200 mg Per NG tube BID  . antiseptic oral rinse  15 mL Mouth Rinse QID  . aspirin  324 mg Per Tube Daily  . aspirin  300 mg Rectal Daily  . bisacodyl  10 mg Oral Daily   Or  . bisacodyl  10 mg Rectal Daily  . chlorhexidine  15 mL Mouth Rinse BID  . docusate  200 mg Per Tube Daily  . feeding supplement  60 mL Oral BID  . feeding supplement (VITAL 1.5 CAL)  40 mL/hr Per Tube Q24H  . fluconazole (DIFLUCAN) IV  100 mg Intravenous Daily  . imipenem-cilastatin  500 mg Intravenous Q6H  . insulin aspart  0-24 Units Subcutaneous Q4H  . levalbuterol  1.25 mg Nebulization Q6H  . magic mouthwash  5 mL Oral QID  . metoCLOPramide (REGLAN) injection  10 mg Intravenous  Q6H  . metolazone  5 mg Per Tube Daily  . pantoprazole sodium  40 mg Per Tube BID  . potassium chloride  10 mEq Intravenous Q1 Hr x 4  . potassium chloride  10 mEq Intravenous Q1 Hr x 5  . sodium chloride  10 mL Intravenous Q12H  . vancomycin  1,500 mg Intravenous Q18H  . warfarin  2.5 mg Oral ONCE-1800    Infusions: . sodium chloride 20 mL/hr at 12/29/12 0900  . sodium chloride 20 mL/hr (12/30/12 0544)  . sodium chloride Stopped (12/28/12 0000)  . sodium chloride 30 mL/hr at 12/28/12 2000  . bivalirudin (ANGIOMAX) infusion 0.5 mg/mL (Non-ACS indications) 0.12 mg/kg/hr (12/29/12 2110)  .  dexmedetomidine 0.6 mcg/kg/hr (12/30/12 9811)  . DOBUTamine 4 mcg/kg/min (12/29/12 1900)  . DOPamine 3 mcg/kg/min (12/30/12 0749)  . epinephrine 4 mcg/min (12/30/12 0017)  . fentaNYL infusion INTRAVENOUS 125 mcg/hr (12/29/12 1900)  . furosemide (LASIX) infusion 8 mg/hr (12/29/12 1900)  . lactated ringers Stopped (12/28/12 0000)  . midazolam (VERSED) infusion 3 mg/hr (12/30/12 0503)  . nitroGLYCERIN Stopped (12/18/12 1705)  . norepinephrine (LEVOPHED) Adult infusion 4 mcg/min (12/30/12 0445)  . 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 Vent to deliver nitric oxide, when off nitric oxide we will plan extubation - hope to extubate tomorrow  Continue bival untl chromogenic factor Xa checked and is 40% reduced to document adequate coumadin effect   I reviewed the LVAD parameters from today, and compared the results to the patient's prior recorded data.  No programming changes were made.  The LVAD is functioning within specified parameters.  The patient performs LVAD self-test daily.  LVAD interrogation was negative for any significant power changes, alarms or PI events/speed drops.  LVAD equipment check completed and is in good working order.  Back-up equipment present.   LVAD  education done on emergency procedures and precautions and reviewed exit site care.  Length of Stay: 22  [name] III,Lessie Manigo 12/30/2012, 8:04 AM

## 2012-12-30 NOTE — Progress Notes (Signed)
K+= 2.8 and creat= 0.79 w/ urine o/p > 30cc/hr; TCTS KCL protocol initiated with KCL/ 50cc IV x 4 runs, each over 1 hr.

## 2012-12-31 ENCOUNTER — Inpatient Hospital Stay (HOSPITAL_COMMUNITY): Payer: Commercial Managed Care - PPO

## 2012-12-31 LAB — COMPREHENSIVE METABOLIC PANEL
ALT: 35 U/L (ref 0–35)
AST: 41 U/L — ABNORMAL HIGH (ref 0–37)
Albumin: 2.2 g/dL — ABNORMAL LOW (ref 3.5–5.2)
Alkaline Phosphatase: 186 U/L — ABNORMAL HIGH (ref 39–117)
BUN: 23 mg/dL (ref 6–23)
CO2: 31 mEq/L (ref 19–32)
Calcium: 9.3 mg/dL (ref 8.4–10.5)
Chloride: 97 mEq/L (ref 96–112)
Creatinine, Ser: 0.75 mg/dL (ref 0.50–1.10)
GFR calc Af Amer: >90 mL/min (ref >90)
GFR calc non Af Amer: 87 mL/min — ABNORMAL LOW (ref >90)
Glucose, Bld: 152 mg/dL — ABNORMAL HIGH (ref 70–99)
Potassium: 2.7 mEq/L — CL (ref 3.5–5.1)
Sodium: 138 mEq/L (ref 135–145)
Total Bilirubin: 1.2 mg/dL (ref 0.3–1.2)
Total Protein: 5.4 g/dL — ABNORMAL LOW (ref 6.0–8.3)

## 2012-12-31 LAB — GLUCOSE, CAPILLARY
Glucose-Capillary: 130 mg/dL — ABNORMAL HIGH (ref 70–99)
Glucose-Capillary: 142 mg/dL — ABNORMAL HIGH (ref 70–99)
Glucose-Capillary: 146 mg/dL — ABNORMAL HIGH (ref 70–99)

## 2012-12-31 LAB — CBC
HCT: 27.7 % — ABNORMAL LOW (ref 36.0–46.0)
Hemoglobin: 9.3 g/dL — ABNORMAL LOW (ref 12.0–15.0)
MCH: 29.1 pg (ref 26.0–34.0)
MCHC: 33.6 g/dL (ref 30.0–36.0)
MCV: 86.6 fL (ref 78.0–100.0)
Platelets: 498 10*3/uL — ABNORMAL HIGH (ref 150–400)
RBC: 3.2 MIL/uL — ABNORMAL LOW (ref 3.87–5.11)
RDW: 16.1 % — ABNORMAL HIGH (ref 11.5–15.5)
WBC: 19.4 10*3/uL — ABNORMAL HIGH (ref 4.0–10.5)

## 2012-12-31 LAB — POCT I-STAT 3, ART BLOOD GAS (G3+)
Acid-Base Excess: 11 mmol/L — ABNORMAL HIGH (ref 0.0–2.0)
Acid-Base Excess: 8 mmol/L — ABNORMAL HIGH (ref 0.0–2.0)
Acid-Base Excess: 8 mmol/L — ABNORMAL HIGH (ref 0.0–2.0)
Bicarbonate: 35 mEq/L — ABNORMAL HIGH (ref 20.0–24.0)
Bicarbonate: 32.8 mEq/L — ABNORMAL HIGH (ref 20.0–24.0)
Patient temperature: 37.7
Patient temperature: 38
TCO2: 34 mmol/L (ref 0–100)
pCO2 arterial: 39.6 mmHg (ref 35.0–45.0)
pH, Arterial: 7.427 (ref 7.350–7.450)
pO2, Arterial: 109 mmHg — ABNORMAL HIGH (ref 80.0–100.0)

## 2012-12-31 LAB — CARBOXYHEMOGLOBIN
Carboxyhemoglobin: 1.4 % (ref 0.5–1.5)
Methemoglobin: 1 % (ref 0.0–1.5)
O2 Saturation: 56.4 %
Total hemoglobin: 9.1 g/dL — ABNORMAL LOW (ref 12.0–16.0)

## 2012-12-31 LAB — BASIC METABOLIC PANEL
BUN: 27 mg/dL — ABNORMAL HIGH (ref 6–23)
CO2: 31 mEq/L (ref 19–32)
Calcium: 9.2 mg/dL (ref 8.4–10.5)
Chloride: 98 mEq/L (ref 96–112)
Creatinine, Ser: 0.84 mg/dL (ref 0.50–1.10)
GFR calc Af Amer: 83 mL/min — ABNORMAL LOW (ref >90)
GFR calc non Af Amer: 71 mL/min — ABNORMAL LOW (ref >90)
Glucose, Bld: 137 mg/dL — ABNORMAL HIGH (ref 70–99)
Potassium: 3.6 mEq/L (ref 3.5–5.1)
Sodium: 139 mEq/L (ref 135–145)

## 2012-12-31 LAB — VANCOMYCIN, TROUGH: Vancomycin Tr: 21.7 ug/mL — ABNORMAL HIGH (ref 10.0–20.0)

## 2012-12-31 LAB — APTT
aPTT: 85 seconds — ABNORMAL HIGH (ref 24–37)
aPTT: 65 seconds — ABNORMAL HIGH (ref 24–37)
aPTT: 82 seconds — ABNORMAL HIGH (ref 24–37)

## 2012-12-31 LAB — PROTIME-INR
INR: 2.27 — ABNORMAL HIGH (ref 0.00–1.49)
Prothrombin Time: 24.3 seconds — ABNORMAL HIGH (ref 11.6–15.2)

## 2012-12-31 LAB — LACTATE DEHYDROGENASE: LDH: 369 U/L — ABNORMAL HIGH (ref 94–250)

## 2012-12-31 MED ORDER — SILDENAFIL CITRATE 20 MG PO TABS
20.0000 mg | ORAL_TABLET | Freq: Two times a day (BID) | ORAL | Status: DC
  Administered 2012-12-31 – 2013-01-01 (×4): 20 mg
  Filled 2012-12-31 (×6): qty 1

## 2012-12-31 MED ORDER — POTASSIUM CHLORIDE 10 MEQ/50ML IV SOLN
INTRAVENOUS | Status: AC
  Filled 2012-12-31: qty 150

## 2012-12-31 MED ORDER — SILDENAFIL CITRATE 20 MG PO TABS
20.0000 mg | ORAL_TABLET | Freq: Three times a day (TID) | ORAL | Status: DC
  Filled 2012-12-31 (×3): qty 1

## 2012-12-31 MED ORDER — POTASSIUM CHLORIDE 10 MEQ/50ML IV SOLN
INTRAVENOUS | Status: AC
  Filled 2012-12-31: qty 250

## 2012-12-31 MED ORDER — POTASSIUM CHLORIDE 10 MEQ/50ML IV SOLN
10.0000 meq | INTRAVENOUS | Status: AC | PRN
  Administered 2012-12-31 (×3): 10 meq via INTRAVENOUS

## 2012-12-31 MED ORDER — POTASSIUM CHLORIDE 10 MEQ/50ML IV SOLN
10.0000 meq | INTRAVENOUS | Status: AC
  Administered 2012-12-31 (×5): 10 meq via INTRAVENOUS

## 2012-12-31 MED ORDER — METOCLOPRAMIDE HCL 5 MG/ML IJ SOLN
10.0000 mg | Freq: Four times a day (QID) | INTRAMUSCULAR | Status: DC
  Filled 2012-12-31 (×4): qty 2

## 2012-12-31 MED ORDER — WARFARIN SODIUM 4 MG PO TABS
4.0000 mg | ORAL_TABLET | Freq: Once | ORAL | Status: AC
  Administered 2012-12-31: 4 mg via ORAL
  Filled 2012-12-31: qty 1

## 2012-12-31 MED ORDER — VANCOMYCIN HCL 10 G IV SOLR
1500.0000 mg | INTRAVENOUS | Status: DC
  Administered 2013-01-01 – 2013-01-03 (×3): 1500 mg via INTRAVENOUS
  Filled 2012-12-31 (×4): qty 1500

## 2012-12-31 MED ORDER — POTASSIUM CHLORIDE 10 MEQ/50ML IV SOLN
10.0000 meq | INTRAVENOUS | Status: AC
  Administered 2012-12-31 (×4): 10 meq via INTRAVENOUS
  Filled 2012-12-31: qty 200

## 2012-12-31 NOTE — Procedures (Signed)
Extubation Procedure Note  Patient Details:   Name: Kaitlin [name] DOB: March 01, 1946 MRN: [id number]   Airway Documentation:     Evaluation  O2 sats: stable throughout Complications: No apparent complications Patient did tolerate procedure well. Bilateral Breath Sounds: Clear;Diminished Suctioning: Airway Yes pt able to vocalize.   Pt extubated at this time per MD order. Pt tolerated well. Placed pt on 50% VM with 10ppm Nitric Oxide per MD order. Pt was able to breathe around deflated cuff. VS remain stable. No Stridor noted. Pt has strong, adequate cough. RT will continue to monitor.   [facility] 12/31/2012, 12:42 PM

## 2012-12-31 NOTE — Progress Notes (Signed)
. HeartMate 2 Rounding Note  Subjective:   Postop day #13   HeartMate 2 implantation with subsequent emergency centri- mag RVAD because of  RV dysfunction. Patient  had  stable hemodynamics on BiVAD support.Returned to OR 8-25 for successful removal of RVAD   Responsive but comfortable with IV sedation.  Had successful delayed sternal closure  8-28-014  Patient continues to have stable hemodynamics after chest closure and we are slowly weaning inotropes and inhaled nitric oxide. Nitric now off. Patient has significant fluid overload -- diuresing well on Lasix drip CVP remains 15, LVAD flow rates 3.3 L per minute, cardiac index 2.0   Patient was placed on bivalirudin8-29 due to heparin resistance and dose currently is 0.12 mg per kilogram per minute with PTT of 75-80 sec  Patient has been dosed with Coumadin 5 mg tvia feeding tube and last INR 2.2 with goal 2.5  UGI bleed from M-W tear- now inactive, on protonix  Feeding tube post-pyloric and Vital 1.5 started--rectal tube placed for diarrhea tube feeds at goal   diuresis for fluid overload- lasix drip now at 4mg /hr  WBC >30k> 25>20>18 with procalcitonin 1.3>1.0 on Vanc, Primaxin and short course of IV Diflucan. Cultures neg  Patient has been sedated but will react and followed commands appropriately when her sedation is lightened. She remains on the ventilator in order to provide inhaled nitric oxide which he needs for significant RV dysfunction which is now improving. Weaning nitric oxide slowly and then extubation- Nitric oxide weaned off overnight   LVAD INTERROGATION:  HeartMate II LVAD:  Flow 3.5 liters/min, speed 8400 power 3.2, PI 4.7.   Objective:    Vital Signs:   Temp:  [96.8 F (36 C)-99.7 F (37.6 C)] 97.5 F (36.4 C) (08/31 0900) Pulse Rate:  [95-109] 108 (08/31 0900) Resp:  [17-27] 21 (08/31 0900) BP: (87-88)/(71-74) 87/71 mmHg (08/30 1513) SpO2:  [97 %-100 %] 100 % (08/31 0934) FiO2 (%):  [40 %-50 %] 40 % (08/31  0934) Weight:  [190 lb 4.1 oz (86.3 kg)] 190 lb 4.1 oz (86.3 kg) (08/31 0500) Last BM Date: 12/31/12 Mean arterial Pressure 84  Intake/Output:   Intake/Output Summary (Last 24 hours) at 12/31/12 0937 Last data filed at 12/31/12 0904  Gross per 24 hour  Intake 5391.75 ml  Output   9745 ml  Net -4353.25 ml     Physical Exam: General: Intubated in ICU , sedated and comfortable HEENT: normal Neck: supple. JVP normal  No lymphadenopathy or thryomegaly appreciated. Cor: Mechanical heart sounds with LVAD hum present. Lungs: clear Abdomen: soft, nontender, nondistended. No hepatosplenomegaly. No bruits or masses. Good bowel sounds. Extremities: no cyanosis, clubbing, rash,  Sig edema remains  extremities warm Neuro: moves all 4 extremities but now is on sedation protocol while on ventilator  Telemetry: sinus 94/min  Labs: Basic Metabolic Panel:  Recent Labs Lab 12/25/12 0339  12/26/12 0355  12/29/12 0435 12/29/12 1600 12/30/12 0109 12/30/12 0425 12/30/12 1400 12/31/12 0400  NA 131*  < > 130*  < > 133* 134* 138 135 137 138  K 3.8  < > 5.2*  < > 2.8* 2.6* 2.8* 3.0* 3.1* 2.7*  CL 97  < > 98  < > 100 100  --  98 98 97  CO2 31  < > 23  < > 22 22  --  25 30 31   GLUCOSE 142*  < > 142*  < > 161* 176* 167* 199* 161* 152*  BUN 17  < > 24*  < >  21 21  --  20 21 23   CREATININE 0.72  < > 0.84  < > 0.79 0.82  --  0.79 0.78 0.75  CALCIUM 8.1*  < > 8.5  < > 8.0* 8.5  --  8.6 8.9 9.3  MG 2.1  --  1.8  --   --   --   --   --   --   --   PHOS 1.9*  --  2.9  --   --   --   --   --   --   --   < > = values in this interval not displayed.  Liver Function Tests:  Recent Labs Lab 12/27/12 0450 12/28/12 0428 12/29/12 0435 12/30/12 0425 12/31/12 0400  AST 23 26 35 44* 41*  ALT 11 13 20  33 35  ALKPHOS 120* 145* 163* 191* 186*  BILITOT 1.5* 2.0* 1.6* 1.1 1.2  PROT 4.7* 5.0* 5.2* 5.2* 5.4*  ALBUMIN 2.2* 2.5* 2.1* 2.2* 2.2*   No results found for this basename: LIPASE, AMYLASE,  in the  last 168 hours No results found for this basename: AMMONIA,  in the last 168 hours  CBC:  Recent Labs Lab 12/25/12 0339  12/28/12 1727  12/29/12 0435 12/29/12 1600 12/30/12 0109 12/30/12 0425 12/31/12 0400  WBC 19.3*  < > 26.2*  --  23.2* 23.6*  --  22.0* 19.4*  NEUTROABS 15.1*  --   --   --   --   --   --   --   --   HGB 7.3*  < > 8.9*  < > 8.6* 9.2* 8.8* 8.9* 9.3*  HCT 21.3*  < > 25.4*  < > 24.7* 26.5* 26.0* 26.5* 27.7*  MCV 87.3  < > 84.9  --  85.8 86.6  --  86.0 86.6  PLT 178  < > 342  --  347 403*  --  447* 498*  < > = values in this interval not displayed.  INR:  Recent Labs Lab 12/26/12 0355 12/29/12 1349 12/30/12 0425 12/31/12 0400  INR 1.25 1.85* 2.23* 2.27*    Other results:  EKG:   Imaging: Dg Chest Port 1 View  12/31/2012   *RADIOLOGY REPORT*  Clinical Data: Ventricular assist device  PORTABLE CHEST - 1 VIEW  Comparison: 12/30/2012  Findings: The endotracheal tube and feeding catheter are within normal limits.  A Swan-Ganz catheter is noted in the pulmonary outflow tract.  A defibrillator is seen as well as a left ventricular assist device.  Persistent changes are noted in the right mid lung and left lung base.  No new focal abnormality is noted.  IMPRESSION: No change from prior exam.   Original Report Authenticated By: Alcide Clever, M.D.   Dg Chest Port 1 View  12/30/2012   *RADIOLOGY REPORT*  Clinical Data: 67 year old female - status post implantable left ventricular assist device.  PORTABLE CHEST - 1 VIEW  Comparison: 12/29/2012 and prior chest radiographs  Findings: An endotracheal tube is present with tip at the carina - recommend 1-2 cm retraction. Left ventricular assist device is unchanged. A left-sided pacemaker, Swan-Ganz catheter, right subclavian central venous catheter and small bore feeding tube are stable. Bilateral atelectasis is unchanged. There is no evidence of pneumothorax or large pleural effusion.  IMPRESSION: Endotracheal tube with tip at  the carina - recommend 1-2 cm retraction.  Otherwise stable chest as described.  These results were called to Liborio Nixon, RN on 12/30/2012 at 7:50 AM.   Original Report  Authenticated By: Harmon Pier, M.D.     Medications:     Scheduled Medications: . amiodarone  200 mg Per NG tube BID  . antiseptic oral rinse  15 mL Mouth Rinse QID  . aspirin  324 mg Per Tube Daily  . aspirin  300 mg Rectal Daily  . chlorhexidine  15 mL Mouth Rinse BID  . feeding supplement  60 mL Oral BID  . feeding supplement (VITAL 1.5 CAL)  40 mL/hr Per Tube Q24H  . imipenem-cilastatin  500 mg Intravenous Q6H  . insulin aspart  0-24 Units Subcutaneous Q4H  . levalbuterol  1.25 mg Nebulization Q6H  . magic mouthwash  5 mL Oral QID  . pantoprazole sodium  40 mg Per Tube BID  . potassium chloride  10 mEq Intravenous Q1 Hr x 5  . potassium chloride      . sildenafil  20 mg Per Tube BID  . sodium chloride  10 mL Intravenous Q12H  . vancomycin  1,500 mg Intravenous Q18H  . warfarin  4 mg Oral ONCE-1800  . Warfarin - Physician Dosing Inpatient   Does not apply q1800    Infusions: . sodium chloride 20 mL/hr at 12/31/12 0800  . sodium chloride 20 mL/hr (12/30/12 0544)  . sodium chloride Stopped (12/28/12 0000)  . sodium chloride 30 mL/hr at 12/31/12 0800  . bivalirudin (ANGIOMAX) infusion 0.5 mg/mL (Non-ACS indications) 0.12 mg/kg/hr (12/31/12 0800)  . dexmedetomidine 0.6 mcg/kg/hr (12/31/12 0600)  . DOBUTamine 4 mcg/kg/min (12/31/12 0400)  . DOPamine 3 mcg/kg/min (12/31/12 0400)  . epinephrine 1 mcg/min (12/30/12 1300)  . fentaNYL infusion INTRAVENOUS 125 mcg/hr (12/31/12 0400)  . furosemide (LASIX) infusion 4 mg/hr (12/31/12 0905)  . lactated ringers Stopped (12/28/12 0000)  . midazolam (VERSED) infusion 4 mg/hr (12/31/12 0600)  . norepinephrine (LEVOPHED) Adult infusion 8 mcg/min (12/31/12 0800)  . 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  No programming changes were made.  The LVAD is functioning within specified parameters.  The patient performs LVAD self-test daily.  LVAD interrogation was negative for any significant power changes, alarms or PI events/speed drops.  LVAD equipment check completed and is in good working order.  Back-up equipment present.   LVAD education done on emergency procedures and precautions and reviewed exit site care.  Length of Stay: 23  [name] III,Idabelle Mcpeters 12/31/2012, 9:37 AM

## 2012-12-31 NOTE — Progress Notes (Signed)
ANTIBIOTIC CONSULT NOTE   Pharmacy Consult for Vancomycin Indication: rule out pneumonia  No Known Allergies  Patient Measurements: Height: 5\' 9"  (175.3 cm) Weight: 190 lb 4.1 oz (86.3 kg) IBW/kg (Calculated) : 66.2  Vital Signs: Temp: 100.6 F (38.1 C) (08/31 2100) Temp src: Core (Comment) (08/31 2000) Pulse Rate: 123 (08/31 2100) Intake/Output from previous day: 08/30 0701 - 08/31 0700 In: 5960.9 [I.V.:3000.9; NG/GT:1060; IV Piggyback:1900] Out: 10272 [Urine:8575; Stool:1600; Chest Tube:120] Intake/Output from this shift: Total I/O In: 228.3 [I.V.:108.3; NG/GT:70; IV Piggyback:50] Out: 400 [Urine:400]  Labs:  Recent Labs  12/29/12 1600 12/30/12 0109 12/30/12 0425 12/30/12 1400 12/31/12 0400 12/31/12 1400  WBC 23.6*  --  22.0*  --  19.4*  --   HGB 9.2* 8.8* 8.9*  --  9.3*  --   PLT 403*  --  447*  --  498*  --   CREATININE 0.82  --  0.79 0.78 0.75 0.84   Estimated Creatinine Clearance: 78.2 ml/min (by C-G formula based on Cr of 0.84).  Recent Labs  12/31/12 1930  VANCOTROUGH 21.7*    Assessment: 67 y.o. F on Vancomycin + primaxin for r/o PNA.  WBC elevated at 19.4, but trending down.  SCr stable.  Tm 100.6.  Vancomycin trough reported as 21.7 on vancomycin 1500 mg IV q18h -- slighty greater than goal.  Goal of Therapy:  Vancomycin trough level 15-20 mcg/ml  Plan:  - Decrease vancomycin to 1500 mg IV q24h -- start next dose 9/1 at 0600 - f/u trough at new Css - Follow up SCr, UOP, cultures, clinical course and adjust as clinically indicated  Kavish Lafitte L. Illene Bolus, PharmD, BCPS Clinical Pharmacist Pager: [phone] Pharmacy: [phone] 12/31/2012 9:01 PM

## 2012-12-31 NOTE — Progress Notes (Addendum)
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 EF 15-25% with mild to moderate RV dysfunction  Underwent HM II LVAD implant (under destination criteria) along with TV repair on 8/18.  She was taken back to the OR for RVAD support.   On 8/25 was taken back to OR for RVAD explant.  8/26 EGD which showed MW tear in esophagus. Injected and clipped. No further bleeding.   8/27  Back to OR for chest closure.   Epi now off. Weaning NO - down to 5. PA pressures in high 20s-low 30s. Excellent diuresis yesterday on IV lasix and metolazone. Weight down 7 pounds. CVP 14. Sedated but arouses on vent. On bivalirudin. K low. Copious liquid stool due to TFs.   MAPs 60-70s. Co-ox 56%  LVAD INTERROGATION:   HeartMate II LVAD:  Flow 3.6 Liters/min 3.4, speed 8390  power 4.0, PI 3.0-3.5  Occasional PI events  Objective:    Vital Signs:   Temp:  [96.8 F (36 C)-99.9 F (37.7 C)] 97.7 F (36.5 C) (08/31 0700) Pulse Rate:  [95-109] 101 (08/31 0700) Resp:  [15-27] 18 (08/31 0700) BP: (87-88)/(71-74) 87/71 mmHg (08/30 1513) SpO2:  [97 %-100 %] 99 % (08/31 0700) FiO2 (%):  [50 %] 50 % (08/31 0600) Weight:  [86.3 kg (190 lb 4.1 oz)] 86.3 kg (190 lb 4.1 oz) (08/31 0500) Last BM Date: 12/30/12 Mean arterial Pressure 60-70s  Intake/Output:   Intake/Output Summary (Last 24 hours) at 12/31/12 0752 Last data filed at 12/31/12 0700  Gross per 24 hour  Intake 5910.89 ml  Output   9720 ml  Net -3809.11 ml     Physical Exam: General: Intubated and sedated, arouses and interacts HEENT: normal Neck: supple. CVP 14. Carotids 2+ bilat; no bruits. No lymphadenopathy or thryomegaly appreciated.  Cor: Mechanical heart sounds with LVAD hum present.;Lungs: course Abdomen: soft, nontender, mildly distended. Hypoactive BS Driveline: C/D/I; securement device intact  Extremities: no cyanosis, clubbing, rash, 2+  edema.  Neuro: intubated sedated, cranial nerves grossly intact. moves all 4 extremities w/o difficulty. Affect pleasant  Telemetry: NSR 100-105  Labs: Basic Metabolic Panel:  Recent Labs Lab 12/25/12 0339  12/26/12 0355  12/29/12 0435 12/29/12 1600 12/30/12 0109 12/30/12 0425 12/30/12 1400 12/31/12 0400  NA 131*  < > 130*  < > 133* 134* 138 135 137 138  K 3.8  < > 5.2*  < > 2.8* 2.6* 2.8* 3.0* 3.1* 2.7*  CL 97  < > 98  < > 100 100  --  98 98 97  CO2 31  < > 23  < > 22 22  --  25 30 31   GLUCOSE 142*  < > 142*  < > 161* 176* 167* 199* 161* 152*  BUN 17  < > 24*  < > 21 21  --  20 21 23   CREATININE 0.72  < > 0.84  < > 0.79 0.82  --  0.79 0.78 0.75  CALCIUM 8.1*  < > 8.5  < > 8.0* 8.5  --  8.6 8.9 9.3  MG 2.1  --  1.8  --   --   --   --   --   --   --   PHOS 1.9*  --  2.9  --   --   --   --   --   --   --   < > =  values in this interval not displayed.  Liver Function Tests:  Recent Labs Lab 12/27/12 0450 12/28/12 0428 12/29/12 0435 12/30/12 0425 12/31/12 0400  AST 23 26 35 44* 41*  ALT 11 13 20  33 35  ALKPHOS 120* 145* 163* 191* 186*  BILITOT 1.5* 2.0* 1.6* 1.1 1.2  PROT 4.7* 5.0* 5.2* 5.2* 5.4*  ALBUMIN 2.2* 2.5* 2.1* 2.2* 2.2*   No results found for this basename: LIPASE, AMYLASE,  in the last 168 hours No results found for this basename: AMMONIA,  in the last 168 hours  CBC:  Recent Labs Lab 12/25/12 0339  12/28/12 1727  12/29/12 0435 12/29/12 1600 12/30/12 0109 12/30/12 0425 12/31/12 0400  WBC 19.3*  < > 26.2*  --  23.2* 23.6*  --  22.0* 19.4*  NEUTROABS 15.1*  --   --   --   --   --   --   --   --   HGB 7.3*  < > 8.9*  < > 8.6* 9.2* 8.8* 8.9* 9.3*  HCT 21.3*  < > 25.4*  < > 24.7* 26.5* 26.0* 26.5* 27.7*  MCV 87.3  < > 84.9  --  85.8 86.6  --  86.0 86.6  PLT 178  < > 342  --  347 403*  --  447* 498*  < > = values in this interval not displayed.  INR:  Recent Labs Lab 12/26/12 0355 12/29/12 1349 12/30/12 0425 12/31/12 0400  INR 1.25 1.85*  2.23* 2.27*    Imaging: Dg Chest Port 1 View  12/31/2012   *RADIOLOGY REPORT*  Clinical Data: Ventricular assist device  PORTABLE CHEST - 1 VIEW  Comparison: 12/30/2012  Findings: The endotracheal tube and feeding catheter are within normal limits.  A Swan-Ganz catheter is noted in the pulmonary outflow tract.  A defibrillator is seen as well as a left ventricular assist device.  Persistent changes are noted in the right mid lung and left lung base.  No new focal abnormality is noted.  IMPRESSION: No change from prior exam.   Original Report Authenticated By: Alcide Clever, M.D.   Dg Chest Port 1 View  12/30/2012   *RADIOLOGY REPORT*  Clinical Data: 67 year old female - status post implantable left ventricular assist device.  PORTABLE CHEST - 1 VIEW  Comparison: 12/29/2012 and prior chest radiographs  Findings: An endotracheal tube is present with tip at the carina - recommend 1-2 cm retraction. Left ventricular assist device is unchanged. A left-sided pacemaker, Swan-Ganz catheter, right subclavian central venous catheter and small bore feeding tube are stable. Bilateral atelectasis is unchanged. There is no evidence of pneumothorax or large pleural effusion.  IMPRESSION: Endotracheal tube with tip at the carina - recommend 1-2 cm retraction.  Otherwise stable chest as described.  These results were called to Liborio Nixon, RN on 12/30/2012 at 7:50 AM.   Original Report Authenticated By: Harmon Pier, M.D.     Medications:     Scheduled Medications: . amiodarone  150 mg Intravenous Once  . amiodarone  200 mg Per NG tube BID  . antiseptic oral rinse  15 mL Mouth Rinse QID  . aspirin  324 mg Per Tube Daily  . aspirin  300 mg Rectal Daily  . bisacodyl  10 mg Oral Daily   Or  . bisacodyl  10 mg Rectal Daily  . chlorhexidine  15 mL Mouth Rinse BID  . docusate  200 mg Per Tube Daily  . feeding supplement  60 mL Oral BID  . feeding supplement (  VITAL 1.5 CAL)  40 mL/hr Per Tube Q24H  . imipenem-cilastatin   500 mg Intravenous Q6H  . insulin aspart  0-24 Units Subcutaneous Q4H  . levalbuterol  1.25 mg Nebulization Q6H  . magic mouthwash  5 mL Oral QID  . metoCLOPramide (REGLAN) injection  10 mg Intravenous Q6H  . metolazone  5 mg Per Tube Daily  . pantoprazole sodium  40 mg Per Tube BID  . sodium chloride  10 mL Intravenous Q12H  . vancomycin  1,500 mg Intravenous Q18H  . Warfarin - Physician Dosing Inpatient   Does not apply q1800    Infusions: . sodium chloride 20 mL/hr at 12/31/12 0400  . sodium chloride 20 mL/hr (12/30/12 0544)  . sodium chloride Stopped (12/28/12 0000)  . sodium chloride 30 mL/hr at 12/31/12 0400  . bivalirudin (ANGIOMAX) infusion 0.5 mg/mL (Non-ACS indications) 0.12 mg/kg/hr (12/31/12 0400)  . dexmedetomidine 0.6 mcg/kg/hr (12/31/12 0600)  . DOBUTamine 4 mcg/kg/min (12/31/12 0400)  . DOPamine 3 mcg/kg/min (12/31/12 0400)  . epinephrine 1 mcg/min (12/30/12 1300)  . fentaNYL infusion INTRAVENOUS 125 mcg/hr (12/31/12 0400)  . furosemide (LASIX) infusion 6 mg/hr (12/31/12 0400)  . lactated ringers Stopped (12/28/12 0000)  . midazolam (VERSED) infusion 4 mg/hr (12/31/12 0600)  . nitroGLYCERIN Stopped (12/18/12 1705)  . norepinephrine (LEVOPHED) Adult infusion 4 mcg/min (12/31/12 0400)  . phenylephrine (NEO-SYNEPHRINE) Adult infusion Stopped (12/24/12 0000)    PRN Medications: acetaminophen (TYLENOL) oral liquid 160 mg/5 mL, diphenhydrAMINE, fentaNYL, midazolam, morphine injection, ondansetron (ZOFRAN) IV, ondansetron (ZOFRAN) IV, potassium chloride, sodium chloride, sodium chloride   Assessment:   1. Acute on chronic systolic HF with biventricular HF EF 10%  --S/p HM II VAD with TV repair 12/18/12  --s/p RVAD. - explanted 8/25 2. 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 Cardiogenic shock  4. VT s/p Biotronik ICD on amio 5. Chronic renal failure, stage III 6. 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 ABL anemia  8. PNA 9. Severe  protein calorie malnutrition - on TPN 10. Hyponatremia 11. GI bleeding with Clayborne Artist tear  Plan/Discussion:    She continues to improve. EPI off. Nitric down. Making good urine.  This am I turned NO from 5 to off. PA pressures did not change but PI dipped under 3.0. Levophed increased to 8. Will continue to follow. Will supp K+ (8 runs ordered). Continue to diurese - still 25 pounds above baseline.   Continue abx and anticoagulation. VAD parameters look god.   Hopefully we can wean vent later today. Would not wean inotropes further until she is extubated.   The patient is critically ill with multiple organ systems failure and requires high complexity decision making for assessment and support, frequent evaluation and titration of therapies, application of advanced monitoring technologies and extensive interpretation of multiple databases.   Critical Care Time devoted to patient care services described in this note is 40 Minutes.  [name] 7:52 AM   Length of Stay: 23 VAD Team Pager [phone] (7am - 7am) VAD Issue  Non-VAD issues HF Pager [phone]

## 2012-12-31 NOTE — Progress Notes (Signed)
CRITICAL VALUE ALERT  Critical value received: K of 2.7  Date of notification:  12/31/2112  Time of notification:  0505  Critical value read back:yes  Nurse who received alert:  Burna Cash  MD notified (1st page):  Dr. Donata Clay  Time of first page:  0605  MD notified (2nd page):  Time of second page:  Responding MD:    Time MD responded:

## 2013-01-01 ENCOUNTER — Inpatient Hospital Stay (HOSPITAL_COMMUNITY): Payer: Commercial Managed Care - PPO

## 2013-01-01 LAB — POCT I-STAT 3, ART BLOOD GAS (G3+)
Acid-Base Excess: 11 mmol/L — ABNORMAL HIGH (ref 0.0–2.0)
Acid-Base Excess: 13 mmol/L — ABNORMAL HIGH (ref 0.0–2.0)
Acid-Base Excess: 15 mmol/L — ABNORMAL HIGH (ref 0.0–2.0)
Bicarbonate: 38.7 mEq/L — ABNORMAL HIGH (ref 20.0–24.0)
Bicarbonate: 39.4 mEq/L — ABNORMAL HIGH (ref 20.0–24.0)
O2 Saturation: 92 %
O2 Saturation: 95 %
Patient temperature: 100.4
Patient temperature: 98.9
TCO2: 39 mmol/L (ref 0–100)
TCO2: 41 mmol/L (ref 0–100)
pH, Arterial: 7.417 (ref 7.350–7.450)
pO2, Arterial: 74 mmHg — ABNORMAL LOW (ref 80.0–100.0)

## 2013-01-01 LAB — GLUCOSE, CAPILLARY
Glucose-Capillary: 164 mg/dL — ABNORMAL HIGH (ref 70–99)
Glucose-Capillary: 163 mg/dL — ABNORMAL HIGH (ref 70–99)
Glucose-Capillary: 144 mg/dL — ABNORMAL HIGH (ref 70–99)
Glucose-Capillary: 128 mg/dL — ABNORMAL HIGH (ref 70–99)

## 2013-01-01 LAB — CARBOXYHEMOGLOBIN
Carboxyhemoglobin: 1.6 % — ABNORMAL HIGH (ref 0.5–1.5)
Methemoglobin: 1.7 % — ABNORMAL HIGH (ref 0.0–1.5)
O2 Saturation: 50.4 %
Total hemoglobin: 9 g/dL — ABNORMAL LOW (ref 12.0–16.0)

## 2013-01-01 LAB — COMPREHENSIVE METABOLIC PANEL
ALT: 35 U/L (ref 0–35)
AST: 39 U/L — ABNORMAL HIGH (ref 0–37)
Albumin: 2.6 g/dL — ABNORMAL LOW (ref 3.5–5.2)
Alkaline Phosphatase: 202 U/L — ABNORMAL HIGH (ref 39–117)
BUN: 28 mg/dL — ABNORMAL HIGH (ref 6–23)
CO2: 34 mEq/L — ABNORMAL HIGH (ref 19–32)
Calcium: 9.5 mg/dL (ref 8.4–10.5)
Chloride: 95 mEq/L — ABNORMAL LOW (ref 96–112)
Creatinine, Ser: 0.89 mg/dL (ref 0.50–1.10)
GFR calc Af Amer: 77 mL/min — ABNORMAL LOW (ref >90)
GFR calc non Af Amer: 67 mL/min — ABNORMAL LOW (ref >90)
Glucose, Bld: 181 mg/dL — ABNORMAL HIGH (ref 70–99)
Potassium: 3.2 mEq/L — ABNORMAL LOW (ref 3.5–5.1)
Sodium: 138 mEq/L (ref 135–145)
Total Bilirubin: 1.2 mg/dL (ref 0.3–1.2)
Total Protein: 6 g/dL (ref 6.0–8.3)

## 2013-01-01 LAB — BASIC METABOLIC PANEL
BUN: 34 mg/dL — ABNORMAL HIGH (ref 6–23)
CO2: 33 mEq/L — ABNORMAL HIGH (ref 19–32)
Calcium: 9.2 mg/dL (ref 8.4–10.5)
Chloride: 96 mEq/L (ref 96–112)
Creatinine, Ser: 0.9 mg/dL (ref 0.50–1.10)
GFR calc Af Amer: 76 mL/min — ABNORMAL LOW (ref >90)
GFR calc non Af Amer: 66 mL/min — ABNORMAL LOW (ref >90)
Glucose, Bld: 173 mg/dL — ABNORMAL HIGH (ref 70–99)
Potassium: 3.1 mEq/L — ABNORMAL LOW (ref 3.5–5.1)
Sodium: 139 mEq/L (ref 135–145)

## 2013-01-01 LAB — CBC
HCT: 28.4 % — ABNORMAL LOW (ref 36.0–46.0)
Hemoglobin: 9.3 g/dL — ABNORMAL LOW (ref 12.0–15.0)
MCH: 29.2 pg (ref 26.0–34.0)
MCHC: 32.7 g/dL (ref 30.0–36.0)
MCV: 89 fL (ref 78.0–100.0)
Platelets: 594 10*3/uL — ABNORMAL HIGH (ref 150–400)
RBC: 3.19 MIL/uL — ABNORMAL LOW (ref 3.87–5.11)
RDW: 16.3 % — ABNORMAL HIGH (ref 11.5–15.5)
WBC: 23.8 10*3/uL — ABNORMAL HIGH (ref 4.0–10.5)

## 2013-01-01 LAB — LACTATE DEHYDROGENASE: LDH: 495 U/L — ABNORMAL HIGH (ref 94–250)

## 2013-01-01 LAB — APTT
aPTT: 71 seconds — ABNORMAL HIGH (ref 24–37)
aPTT: 91 seconds — ABNORMAL HIGH (ref 24–37)

## 2013-01-01 LAB — PROTIME-INR
INR: 2.23 — ABNORMAL HIGH (ref 0.00–1.49)
Prothrombin Time: 24 seconds — ABNORMAL HIGH (ref 11.6–15.2)

## 2013-01-01 MED ORDER — POTASSIUM CHLORIDE 10 MEQ/50ML IV SOLN
10.0000 meq | INTRAVENOUS | Status: AC
  Administered 2013-01-01 (×2): 10 meq via INTRAVENOUS
  Filled 2013-01-01: qty 100

## 2013-01-01 MED ORDER — INSULIN GLARGINE 100 UNIT/ML ~~LOC~~ SOLN
12.0000 [IU] | Freq: Two times a day (BID) | SUBCUTANEOUS | Status: DC
  Administered 2013-01-01 (×2): 12 [IU] via SUBCUTANEOUS
  Filled 2013-01-01 (×3): qty 0.12

## 2013-01-01 MED ORDER — FUROSEMIDE 10 MG/ML IJ SOLN
20.0000 mg | Freq: Once | INTRAMUSCULAR | Status: AC
  Administered 2013-01-01: 20 mg via INTRAVENOUS
  Filled 2013-01-01: qty 2

## 2013-01-01 MED ORDER — POTASSIUM CHLORIDE 10 MEQ/50ML IV SOLN
10.0000 meq | INTRAVENOUS | Status: AC
  Administered 2013-01-01 (×3): 10 meq via INTRAVENOUS

## 2013-01-01 MED ORDER — METOCLOPRAMIDE HCL 5 MG/ML IJ SOLN
10.0000 mg | Freq: Four times a day (QID) | INTRAMUSCULAR | Status: DC
  Administered 2013-01-01 – 2013-01-03 (×10): 10 mg via INTRAVENOUS
  Filled 2013-01-01 (×12): qty 2

## 2013-01-01 MED ORDER — ALPRAZOLAM 0.5 MG PO TABS
0.5000 mg | ORAL_TABLET | Freq: Four times a day (QID) | ORAL | Status: DC | PRN
  Administered 2013-01-01: 0.5 mg via ORAL
  Filled 2013-01-01: qty 1

## 2013-01-01 MED ORDER — OXYCODONE-ACETAMINOPHEN 5-325 MG/5ML PO SOLN
5.0000 mL | ORAL | Status: DC | PRN
  Administered 2013-01-01: 5 mL via ORAL
  Filled 2013-01-01: qty 5

## 2013-01-01 MED ORDER — METOLAZONE 2.5 MG PO TABS
2.5000 mg | ORAL_TABLET | Freq: Once | ORAL | Status: AC
  Administered 2013-01-01: 2.5 mg via ORAL
  Filled 2013-01-01: qty 1

## 2013-01-01 MED ORDER — CITALOPRAM HYDROBROMIDE 20 MG PO TABS
20.0000 mg | ORAL_TABLET | Freq: Every day | ORAL | Status: DC
  Administered 2013-01-01 – 2013-01-18 (×18): 20 mg via ORAL
  Filled 2013-01-01 (×18): qty 1

## 2013-01-01 MED ORDER — POTASSIUM CHLORIDE 10 MEQ/50ML IV SOLN
10.0000 meq | INTRAVENOUS | Status: AC | PRN
  Administered 2013-01-01 (×3): 10 meq via INTRAVENOUS
  Filled 2013-01-01: qty 150

## 2013-01-01 MED ORDER — POTASSIUM CHLORIDE 10 MEQ/50ML IV SOLN
INTRAVENOUS | Status: AC
Start: 2013-01-01 — End: 2013-01-02
  Filled 2013-01-01: qty 150

## 2013-01-01 MED ORDER — WARFARIN SODIUM 4 MG PO TABS
4.0000 mg | ORAL_TABLET | Freq: Once | ORAL | Status: AC
  Administered 2013-01-01: 4 mg via ORAL
  Filled 2013-01-01: qty 1

## 2013-01-01 MED ORDER — DIGOXIN 0.25 MG/ML IJ SOLN
0.1250 mg | Freq: Every day | INTRAMUSCULAR | Status: DC
  Administered 2013-01-01 – 2013-01-03 (×3): 0.125 mg via INTRAVENOUS
  Filled 2013-01-01 (×3): qty 0.5

## 2013-01-01 MED ORDER — ALPRAZOLAM ER 0.5 MG PO TB24: 0.5000 mg | ORAL_TABLET | Freq: Four times a day (QID) | ORAL | Status: DC | PRN

## 2013-01-01 NOTE — Progress Notes (Signed)
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 EF 15-25% with mild to moderate RV dysfunction  Underwent HM II LVAD implant (under destination criteria) along with TV repair on 8/18.  She was taken back to the OR for RVAD support.   On 8/25 was taken back to OR for RVAD explant.  8/26 EGD which showed MW tear in esophagus. Injected and clipped. No further bleeding.   8/27  Back to OR for chest closure.   Extubated yesterday (8/31). Awake. Conversant. Following commands. C/o chest pain. Also depressed. Started on Celexa this am. Remains on 7 levophed and dopa 3, dobut 4. NO 10 through nasal canula. MAPs 70-80s. CVP 15-18. Diureses well. Weight down another 4 pounds but still up 20.   Tele: ST 110-120   Co-ox 50%  LVAD INTERROGATION:   HeartMate II LVAD:  Flow 4.0 Liters/min 3.4, speed 8390  power 4.0, PI 6.1  One PI event over night  Objective:    Vital Signs:   Temp:  [98.6 F (37 C)-100.9 F (38.3 C)] 99.7 F (37.6 C) (09/01 0800) Pulse Rate:  [114-126] 118 (09/01 0800) Resp:  [18-32] 18 (09/01 0800) SpO2:  [88 %-100 %] 94 % (09/01 0800) FiO2 (%):  [40 %-50 %] 50 % (08/31 1446) Weight:  [84.8 kg (186 lb 15.2 oz)] 84.8 kg (186 lb 15.2 oz) (09/01 0500) Last BM Date: 12/31/12 Mean arterial Pressure 60-70s  Intake/Output:   Intake/Output Summary (Last 24 hours) at 01/01/13 0919 Last data filed at 01/01/13 0703  Gross per 24 hour  Intake 4166.5 ml  Output   4765 ml  Net -598.5 ml     Physical Exam: General: Intubated and sedated, arouses and interacts HEENT: normal Neck: supple. CVP 15. Carotids 2+ bilat; no bruits. No lymphadenopathy or thryomegaly appreciated.  Cor: Mechanical heart sounds with LVAD hum present.;Lungs: course Abdomen: soft, nontender, mildly distended. Hypoactive BS Driveline: C/D/I; securement device intact  Extremities: no cyanosis, clubbing,  rash, 2-3+ edema.  Neuro: intubated sedated, cranial nerves grossly intact. moves all 4 extremities w/o difficulty. Affect pleasant  Telemetry: NSR 110-120  Labs: Basic Metabolic Panel:  Recent Labs Lab 12/26/12 0355  12/30/12 0425 12/30/12 1400 12/31/12 0400 12/31/12 1400 01/01/13 0420  NA 130*  < > 135 137 138 139 138  K 5.2*  < > 3.0* 3.1* 2.7* 3.6 3.2*  CL 98  < > 98 98 97 98 95*  CO2 23  < > 25 30 31 31  34*  GLUCOSE 142*  < > 199* 161* 152* 137* 181*  BUN 24*  < > 20 21 23  27* 28*  CREATININE 0.84  < > 0.79 0.78 0.75 0.84 0.89  CALCIUM 8.5  < > 8.6 8.9 9.3 9.2 9.5  MG 1.8  --   --   --   --   --   --   PHOS 2.9  --   --   --   --   --   --   < > = values in this interval not displayed.  Liver Function Tests:  Recent Labs Lab 12/28/12 0428 12/29/12 0435 12/30/12 0425 12/31/12 0400 01/01/13 0420  AST 26 35 44* 41* 39*  ALT 13 20 33 35 35  ALKPHOS 145* 163* 191* 186* 202*  BILITOT 2.0* 1.6* 1.1 1.2 1.2  PROT 5.0* 5.2* 5.2* 5.4* 6.0  ALBUMIN 2.5*  2.1* 2.2* 2.2* 2.6*   No results found for this basename: LIPASE, AMYLASE,  in the last 168 hours No results found for this basename: AMMONIA,  in the last 168 hours  CBC:  Recent Labs Lab 12/29/12 0435 12/29/12 1600 12/30/12 0109 12/30/12 0425 12/31/12 0400 01/01/13 0420  WBC 23.2* 23.6*  --  22.0* 19.4* 23.8*  HGB 8.6* 9.2* 8.8* 8.9* 9.3* 9.3*  HCT 24.7* 26.5* 26.0* 26.5* 27.7* 28.4*  MCV 85.8 86.6  --  86.0 86.6 89.0  PLT 347 403*  --  447* 498* 594*    INR:  Recent Labs Lab 12/26/12 0355 12/29/12 1349 12/30/12 0425 12/31/12 0400 01/01/13 0420  INR 1.25 1.85* 2.23* 2.27* 2.23*    Imaging: Dg Chest Port 1 View  01/01/2013   *RADIOLOGY REPORT*  Clinical Data: Post ventricular assist device placement.  PORTABLE CHEST - 1 VIEW  Comparison: 12/31/2012  Findings: The patient has been extubated.  Swan-Ganz catheter remains with the tip in the main pulmonary artery.  Ventricular assist device shows  stable positioning.  Lungs demonstrate low volumes with slightly worse bilateral lower lobe atelectasis.  No pneumothorax, pulmonary edema or significant pleural fluid is identified.  IMPRESSION: Slight worsening of bilateral lower lobe atelectasis.   Original Report Authenticated By: Irish Lack, M.D.   Dg Chest Port 1 View  12/31/2012   *RADIOLOGY REPORT*  Clinical Data: Ventricular assist device  PORTABLE CHEST - 1 VIEW  Comparison: 12/30/2012  Findings: The endotracheal tube and feeding catheter are within normal limits.  A Swan-Ganz catheter is noted in the pulmonary outflow tract.  A defibrillator is seen as well as a left ventricular assist device.  Persistent changes are noted in the right mid lung and left lung base.  No new focal abnormality is noted.  IMPRESSION: No change from prior exam.   Original Report Authenticated By: Alcide Clever, M.D.     Medications:     Scheduled Medications: . amiodarone  200 mg Per NG tube BID  . antiseptic oral rinse  15 mL Mouth Rinse QID  . aspirin  324 mg Per Tube Daily  . aspirin  300 mg Rectal Daily  . chlorhexidine  15 mL Mouth Rinse BID  . citalopram  20 mg Oral Daily  . digoxin  0.125 mg Intravenous Daily  . feeding supplement  60 mL Oral BID  . feeding supplement (VITAL 1.5 CAL)  40 mL/hr Per Tube Q24H  . imipenem-cilastatin  500 mg Intravenous Q6H  . insulin aspart  0-24 Units Subcutaneous Q4H  . insulin glargine  12 Units Subcutaneous BID  . levalbuterol  1.25 mg Nebulization Q6H  . magic mouthwash  5 mL Oral QID  . pantoprazole sodium  40 mg Per Tube BID  . potassium chloride  10 mEq Intravenous Q1 Hr x 2  . sildenafil  20 mg Per Tube BID  . sodium chloride  10 mL Intravenous Q12H  . vancomycin  1,500 mg Intravenous Q24H  . warfarin  4 mg Oral ONCE-1800  . Warfarin - Physician Dosing Inpatient   Does not apply q1800    Infusions: . sodium chloride 20 mL/hr at 01/01/13 0400  . sodium chloride 20 mL/hr (12/30/12 0544)  .  sodium chloride Stopped (12/28/12 0000)  . sodium chloride 30 mL/hr at 01/01/13 0400  . bivalirudin (ANGIOMAX) infusion 0.5 mg/mL (Non-ACS indications) 0.12 mg/kg/hr (01/01/13 0400)  . dexmedetomidine 0.6 mcg/kg/hr (12/31/12 1002)  . DOBUTamine 4 mcg/kg/min (01/01/13 0400)  . DOPamine 3 mcg/kg/min (01/01/13  0400)  . epinephrine 1 mcg/min (12/30/12 1300)  . fentaNYL infusion INTRAVENOUS 125 mcg/hr (12/31/12 0400)  . furosemide (LASIX) infusion 4 mg/hr (01/01/13 0400)  . lactated ringers Stopped (12/28/12 0000)  . midazolam (VERSED) infusion 4 mg/hr (12/31/12 0600)  . norepinephrine (LEVOPHED) Adult infusion 7 mcg/min (01/01/13 0400)  . phenylephrine (NEO-SYNEPHRINE) Adult infusion Stopped (12/24/12 0000)    PRN Medications: acetaminophen (TYLENOL) oral liquid 160 mg/5 mL, fentaNYL, midazolam, morphine injection, ondansetron (ZOFRAN) IV, ondansetron (ZOFRAN) IV, oxyCODONE-acetaminophen, sodium chloride, sodium chloride   Assessment:   1. Acute on chronic systolic HF with biventricular HF EF 10%  --S/p HM II VAD with TV repair 12/18/12  --s/p RVAD. - explanted 8/25 2. 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 Cardiogenic shock  4. VT s/p Biotronik ICD on amio 5. Chronic renal failure, stage III 6. 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 ABL anemia  8. PNA 9. Severe protein calorie malnutrition - on TPN 10. Hyponatremia 11. GI bleeding with Clayborne Artist tear  Plan/Discussion:    Extubated yesterday. Doing well. Remains on multiple pressors with co-ox trending down. Will have to wean pressors slowly starting with levophed.   Still markedly volume overloaded. Will continue lasix gtt. Give metolazone again today and add low-dose spiro to help preserve K+.   Continue abx and anticoagulation. VAD parameters look good.   Agree with Celexa and pain control. May need anxiolytic as well.   The patient is critically ill with multiple organ systems failure  and requires high complexity decision making for assessment and support, frequent evaluation and titration of therapies, application of advanced monitoring technologies and extensive interpretation of multiple databases.   Critical Care Time devoted to patient care services described in this note is 35 Minutes.  [name] 9:19 AM Length of Stay: 24 VAD Team Pager [phone] (7am - 7am) VAD Issue  Non-VAD issues HF Pager [phone]

## 2013-01-01 NOTE — Consult Note (Signed)
I have reviewed and discussed the care of this patient in detail with the nurse practitioner including pertinent patient records, physical exam findings and data. I agree with details of this encounter.  

## 2013-01-01 NOTE — Anesthesia Postprocedure Evaluation (Signed)
  Anesthesia Post-op Note  Patient: Kaitlin [name]  Procedure(s) Performed: Procedure(s): STERNAL CLOSURE (N/A) MEDIASTINAL EXPLORATION (N/A)  Patient Location: ICU  Anesthesia Type:General  Level of Consciousness: sedated  Airway and Oxygen Therapy: Patient remains intubated per anesthesia plan  Post-op Pain: none  Post-op Assessment: Post-op Vital signs reviewed, Patient's Cardiovascular Status Stable and Respiratory Function Stable  Post-op Vital Signs: Reviewed and stable  Complications: No apparent anesthesia complications

## 2013-01-01 NOTE — Progress Notes (Signed)
. HeartMate 2 Rounding Note  Subjective:   Postop day #14   HeartMate 2 implantation with subsequent emergency centri- mag RVAD because of  RV dysfunction. Patient  had  stable hemodynamics on BiVAD support.Returned to OR 8-25 for successful removal of RVAD   Responsive but comfortable with IV sedation.  Had successful delayed sternal closure  8-28-014  Patient continues to have stable hemodynamics after chest closure and we are slowly weaning inotropes and inhaled nitric oxide. Nitric now off. Patient has significant fluid overload -- diuresing well on Lasix drip CVP remains 15, LVAD flow rates 3.3 L per minute, cardiac index 2.0   Patient was placed on bivalirudin8-29 due to heparin resistance and dose currently is 0.12 mg per kilogram per minute with PTT of 75-80 sec  Patient has been dosed with Coumadin 5 mg tvia feeding tube and last INR 2.2 with goal 2.5  UGI bleed from M-W tear- now inactive, on protonix  Feeding tube post-pyloric and Vital 1.5 started--rectal tube placed for diarrhea tube feeds at goal   diuresis for fluid overload- lasix drip now at 4mg /hr  WBC >30k> 25>20>18 with procalcitonin 1.3>1.0 on Vanc, Primaxin and short course of IV Diflucan. Cultures neg  Patient was extubated yesterday with stable O2 sats, CXR LVAD INTERROGATION:  HeartMate II LVAD:  Flow 3.5 liters/min, speed 8400 power 3.2, PI 4.7.   Objective:    Vital Signs:   Temp:  [97.7 F (36.5 C)-100.9 F (38.3 C)] 97.7 F (36.5 C) (09/01 1136) Pulse Rate:  [106-125] 108 (09/01 1400) Resp:  [18-32] 28 (09/01 1400) SpO2:  [84 %-100 %] 96 % (09/01 1400) FiO2 (%):  [50 %] 50 % (09/01 1130) Weight:  [186 lb 15.2 oz (84.8 kg)] 186 lb 15.2 oz (84.8 kg) (09/01 0500) Last BM Date: 12/31/12 Mean arterial Pressure 84  Intake/Output:   Intake/Output Summary (Last 24 hours) at 01/01/13 1457 Last data filed at 01/01/13 1400  Gross per 24 hour  Intake 4414.8 ml  Output   4800 ml  Net -385.2 ml      Physical Exam: General: extubated, responsive, comfortable HEENT: normal Neck: supple. JVP normal  No lymphadenopathy or thryomegaly appreciated. Cor: Mechanical heart sounds with LVAD hum present. Lungs: clear Abdomen: soft, nontender, nondistended. No hepatosplenomegaly. No bruits or masses. Good bowel sounds. Extremities: no cyanosis, clubbing, rash,  Sig edema remains  extremities warm Neuro: moves all 4 extremities  Telemetry: sinus 104/min  Labs: Basic Metabolic Panel:  Recent Labs Lab 12/26/12 0355  12/30/12 0425 12/30/12 1400 12/31/12 0400 12/31/12 1400 01/01/13 0420  NA 130*  < > 135 137 138 139 138  K 5.2*  < > 3.0* 3.1* 2.7* 3.6 3.2*  CL 98  < > 98 98 97 98 95*  CO2 23  < > 25 30 31 31  34*  GLUCOSE 142*  < > 199* 161* 152* 137* 181*  BUN 24*  < > 20 21 23  27* 28*  CREATININE 0.84  < > 0.79 0.78 0.75 0.84 0.89  CALCIUM 8.5  < > 8.6 8.9 9.3 9.2 9.5  MG 1.8  --   --   --   --   --   --   PHOS 2.9  --   --   --   --   --   --   < > = values in this interval not displayed.  Liver Function Tests:  Recent Labs Lab 12/28/12 0428 12/29/12 0435 12/30/12 0425 12/31/12 0400 01/01/13 0420  AST  26 35 44* 41* 39*  ALT 13 20 33 35 35  ALKPHOS 145* 163* 191* 186* 202*  BILITOT 2.0* 1.6* 1.1 1.2 1.2  PROT 5.0* 5.2* 5.2* 5.4* 6.0  ALBUMIN 2.5* 2.1* 2.2* 2.2* 2.6*   No results found for this basename: LIPASE, AMYLASE,  in the last 168 hours No results found for this basename: AMMONIA,  in the last 168 hours  CBC:  Recent Labs Lab 12/29/12 0435 12/29/12 1600 12/30/12 0109 12/30/12 0425 12/31/12 0400 01/01/13 0420  WBC 23.2* 23.6*  --  22.0* 19.4* 23.8*  HGB 8.6* 9.2* 8.8* 8.9* 9.3* 9.3*  HCT 24.7* 26.5* 26.0* 26.5* 27.7* 28.4*  MCV 85.8 86.6  --  86.0 86.6 89.0  PLT 347 403*  --  447* 498* 594*    INR:  Recent Labs Lab 12/26/12 0355 12/29/12 1349 12/30/12 0425 12/31/12 0400 01/01/13 0420  INR 1.25 1.85* 2.23* 2.27* 2.23*    Other  results:  EKG:   Imaging: Dg Chest Port 1 View  01/01/2013   *RADIOLOGY REPORT*  Clinical Data: Post ventricular assist device placement.  PORTABLE CHEST - 1 VIEW  Comparison: 12/31/2012  Findings: The patient has been extubated.  Swan-Ganz catheter remains with the tip in the main pulmonary artery.  Ventricular assist device shows stable positioning.  Lungs demonstrate low volumes with slightly worse bilateral lower lobe atelectasis.  No pneumothorax, pulmonary edema or significant pleural fluid is identified.  IMPRESSION: Slight worsening of bilateral lower lobe atelectasis.   Original Report Authenticated By: Irish Lack, M.D.   Dg Chest Port 1 View  12/31/2012   *RADIOLOGY REPORT*  Clinical Data: Ventricular assist device  PORTABLE CHEST - 1 VIEW  Comparison: 12/30/2012  Findings: The endotracheal tube and feeding catheter are within normal limits.  A Swan-Ganz catheter is noted in the pulmonary outflow tract.  A defibrillator is seen as well as a left ventricular assist device.  Persistent changes are noted in the right mid lung and left lung base.  No new focal abnormality is noted.  IMPRESSION: No change from prior exam.   Original Report Authenticated By: Alcide Clever, M.D.     Medications:     Scheduled Medications: . amiodarone  200 mg Per NG tube BID  . antiseptic oral rinse  15 mL Mouth Rinse QID  . aspirin  324 mg Per Tube Daily  . aspirin  300 mg Rectal Daily  . chlorhexidine  15 mL Mouth Rinse BID  . citalopram  20 mg Oral Daily  . digoxin  0.125 mg Intravenous Daily  . feeding supplement  60 mL Oral BID  . feeding supplement (VITAL 1.5 CAL)  40 mL/hr Per Tube Q24H  . imipenem-cilastatin  500 mg Intravenous Q6H  . insulin aspart  0-24 Units Subcutaneous Q4H  . insulin glargine  12 Units Subcutaneous BID  . levalbuterol  1.25 mg Nebulization Q6H  . magic mouthwash  5 mL Oral QID  . metoCLOPramide (REGLAN) injection  10 mg Intravenous Q6H  . pantoprazole sodium  40 mg  Per Tube BID  . sildenafil  20 mg Per Tube BID  . sodium chloride  10 mL Intravenous Q12H  . vancomycin  1,500 mg Intravenous Q24H  . warfarin  4 mg Oral ONCE-1800  . Warfarin - Physician Dosing Inpatient   Does not apply q1800    Infusions: . sodium chloride 20 mL/hr at 01/01/13 0400  . sodium chloride 20 mL/hr (12/30/12 0544)  . sodium chloride Stopped (12/28/12 0000)  .  sodium chloride 30 mL/hr at 01/01/13 0400  . bivalirudin (ANGIOMAX) infusion 0.5 mg/mL (Non-ACS indications) 0.12 mg/kg/hr (01/01/13 0400)  . dexmedetomidine 0.6 mcg/kg/hr (12/31/12 1002)  . DOBUTamine 4 mcg/kg/min (01/01/13 0400)  . DOPamine 3 mcg/kg/min (01/01/13 0400)  . fentaNYL infusion INTRAVENOUS 125 mcg/hr (12/31/12 0400)  . furosemide (LASIX) infusion 4 mg/hr (01/01/13 1013)  . lactated ringers Stopped (12/28/12 0000)  . midazolam (VERSED) infusion 4 mg/hr (12/31/12 0600)  . norepinephrine (LEVOPHED) Adult infusion 7 mcg/min (01/01/13 0400)  . 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  No programming changes were made.  The LVAD is functioning within specified parameters.  The patient performs LVAD self-test daily.  LVAD interrogation was negative for any significant power changes, alarms or PI events/speed drops.  LVAD equipment check completed and is in good working order.  Back-up equipment present.   LVAD education done on emergency procedures and precautions and reviewed exit site care.  Length of Stay: 24  [name] III,PETER 01/01/2013, 2:57 PM

## 2013-01-01 NOTE — Progress Notes (Signed)
Respiratory therapy note-Orders received to place on Bipap with nitric running per Dr. Maren Beach, ABG pending in one hour.

## 2013-01-02 ENCOUNTER — Inpatient Hospital Stay (HOSPITAL_COMMUNITY): Payer: Commercial Managed Care - PPO

## 2013-01-02 DIAGNOSIS — I369 Nonrheumatic tricuspid valve disorder, unspecified: Secondary | ICD-10-CM

## 2013-01-02 LAB — COMPREHENSIVE METABOLIC PANEL
ALT: 27 U/L (ref 0–35)
AST: 29 U/L (ref 0–37)
Albumin: 2.6 g/dL — ABNORMAL LOW (ref 3.5–5.2)
Alkaline Phosphatase: 164 U/L — ABNORMAL HIGH (ref 39–117)
BUN: 35 mg/dL — ABNORMAL HIGH (ref 6–23)
CO2: 37 mEq/L — ABNORMAL HIGH (ref 19–32)
Calcium: 9.6 mg/dL (ref 8.4–10.5)
Chloride: 94 mEq/L — ABNORMAL LOW (ref 96–112)
Creatinine, Ser: 0.92 mg/dL (ref 0.50–1.10)
GFR calc Af Amer: 74 mL/min — ABNORMAL LOW (ref >90)
GFR calc non Af Amer: 64 mL/min — ABNORMAL LOW (ref >90)
Glucose, Bld: 185 mg/dL — ABNORMAL HIGH (ref 70–99)
Potassium: 3.1 mEq/L — ABNORMAL LOW (ref 3.5–5.1)
Sodium: 140 mEq/L (ref 135–145)
Total Bilirubin: 1 mg/dL (ref 0.3–1.2)
Total Protein: 6 g/dL (ref 6.0–8.3)

## 2013-01-02 LAB — CBC
HCT: 27.4 % — ABNORMAL LOW (ref 36.0–46.0)
Hemoglobin: 8.6 g/dL — ABNORMAL LOW (ref 12.0–15.0)
MCH: 28.6 pg (ref 26.0–34.0)
MCHC: 31.4 g/dL (ref 30.0–36.0)
MCV: 91 fL (ref 78.0–100.0)
Platelets: 595 10*3/uL — ABNORMAL HIGH (ref 150–400)
RBC: 3.01 MIL/uL — ABNORMAL LOW (ref 3.87–5.11)
RDW: 16.1 % — ABNORMAL HIGH (ref 11.5–15.5)
WBC: 21.9 10*3/uL — ABNORMAL HIGH (ref 4.0–10.5)

## 2013-01-02 LAB — GLUCOSE, CAPILLARY
Glucose-Capillary: 177 mg/dL — ABNORMAL HIGH (ref 70–99)
Glucose-Capillary: 164 mg/dL — ABNORMAL HIGH (ref 70–99)
Glucose-Capillary: 159 mg/dL — ABNORMAL HIGH (ref 70–99)
Glucose-Capillary: 190 mg/dL — ABNORMAL HIGH (ref 70–99)

## 2013-01-02 LAB — CARBOXYHEMOGLOBIN
Carboxyhemoglobin: 1.3 % (ref 0.5–1.5)
Methemoglobin: 1.1 % (ref 0.0–1.5)
O2 Saturation: 56.7 %
Total hemoglobin: 8.9 g/dL — ABNORMAL LOW (ref 12.0–16.0)

## 2013-01-02 LAB — POCT I-STAT 3, ART BLOOD GAS (G3+)
Bicarbonate: 41.3 mEq/L — ABNORMAL HIGH (ref 20.0–24.0)
Patient temperature: 98.5
Patient temperature: 98
TCO2: 43 mmol/L (ref 0–100)
TCO2: 45 mmol/L (ref 0–100)
pCO2 arterial: 51.2 mmHg — ABNORMAL HIGH (ref 35.0–45.0)
pH, Arterial: 7.507 — ABNORMAL HIGH (ref 7.350–7.450)
pH, Arterial: 7.537 — ABNORMAL HIGH (ref 7.350–7.450)
pO2, Arterial: 115 mmHg — ABNORMAL HIGH (ref 80.0–100.0)

## 2013-01-02 LAB — APTT
aPTT: 86 seconds — ABNORMAL HIGH (ref 24–37)
aPTT: 39 seconds — ABNORMAL HIGH (ref 24–37)
aPTT: 81 seconds — ABNORMAL HIGH (ref 24–37)

## 2013-01-02 LAB — PROTIME-INR
INR: 2.57 — ABNORMAL HIGH (ref 0.00–1.49)
INR: 1.93 — ABNORMAL HIGH (ref 0.00–1.49)
Prothrombin Time: 26.7 seconds — ABNORMAL HIGH (ref 11.6–15.2)
Prothrombin Time: 21.5 seconds — ABNORMAL HIGH (ref 11.6–15.2)

## 2013-01-02 LAB — BASIC METABOLIC PANEL
BUN: 37 mg/dL — ABNORMAL HIGH (ref 6–23)
CO2: 35 mEq/L — ABNORMAL HIGH (ref 19–32)
Calcium: 9.5 mg/dL (ref 8.4–10.5)
Chloride: 97 mEq/L (ref 96–112)
Creatinine, Ser: 0.89 mg/dL (ref 0.50–1.10)
GFR calc Af Amer: 77 mL/min — ABNORMAL LOW (ref >90)
GFR calc non Af Amer: 67 mL/min — ABNORMAL LOW (ref >90)
Glucose, Bld: 185 mg/dL — ABNORMAL HIGH (ref 70–99)
Potassium: 2.9 mEq/L — ABNORMAL LOW (ref 3.5–5.1)
Sodium: 142 mEq/L (ref 135–145)

## 2013-01-02 LAB — LACTATE DEHYDROGENASE: LDH: 549 U/L — ABNORMAL HIGH (ref 94–250)

## 2013-01-02 MED ORDER — INSULIN GLARGINE 100 UNIT/ML ~~LOC~~ SOLN
14.0000 [IU] | Freq: Two times a day (BID) | SUBCUTANEOUS | Status: DC
  Administered 2013-01-02 – 2013-01-03 (×3): 14 [IU] via SUBCUTANEOUS
  Filled 2013-01-02 (×4): qty 0.14

## 2013-01-02 MED ORDER — SILDENAFIL CITRATE 20 MG PO TABS
20.0000 mg | ORAL_TABLET | Freq: Three times a day (TID) | ORAL | Status: DC
  Administered 2013-01-02 (×3): 20 mg
  Filled 2013-01-02 (×6): qty 1

## 2013-01-02 MED ORDER — POTASSIUM CHLORIDE 10 MEQ/50ML IV SOLN: 10.0000 meq | INTRAVENOUS | Status: DC

## 2013-01-02 MED ORDER — POTASSIUM CHLORIDE 10 MEQ/50ML IV SOLN
INTRAVENOUS | Status: AC
  Administered 2013-01-02: 10 meq via INTRAVENOUS
  Filled 2013-01-02: qty 50

## 2013-01-02 MED ORDER — POTASSIUM CHLORIDE 10 MEQ/50ML IV SOLN
10.0000 meq | INTRAVENOUS | Status: AC
  Administered 2013-01-02 (×3): 10 meq via INTRAVENOUS
  Filled 2013-01-02: qty 150

## 2013-01-02 MED ORDER — POTASSIUM CHLORIDE 10 MEQ/50ML IV SOLN
10.0000 meq | INTRAVENOUS | Status: AC
  Administered 2013-01-02 (×5): 10 meq via INTRAVENOUS

## 2013-01-02 MED ORDER — TRAMADOL HCL 50 MG PO TABS
50.0000 mg | ORAL_TABLET | Freq: Four times a day (QID) | ORAL | Status: DC | PRN
  Administered 2013-01-08 – 2013-01-16 (×5): 50 mg via ORAL
  Filled 2013-01-02 (×5): qty 1

## 2013-01-02 MED ORDER — WARFARIN SODIUM 5 MG PO TABS
5.0000 mg | ORAL_TABLET | Freq: Once | ORAL | Status: AC
  Administered 2013-01-02: 5 mg via ORAL
  Filled 2013-01-02: qty 1

## 2013-01-02 MED ORDER — FUROSEMIDE 10 MG/ML IJ SOLN
20.0000 mg | Freq: Three times a day (TID) | INTRAMUSCULAR | Status: DC
  Administered 2013-01-02 – 2013-01-03 (×4): 20 mg via INTRAVENOUS
  Filled 2013-01-02 (×5): qty 2

## 2013-01-02 MED ORDER — POTASSIUM CHLORIDE 10 MEQ/50ML IV SOLN
10.0000 meq | Freq: Once | INTRAVENOUS | Status: AC
  Administered 2013-01-02 (×3): 10 meq via INTRAVENOUS
  Filled 2013-01-02: qty 150

## 2013-01-02 MED ORDER — SODIUM CHLORIDE 0.9 % IV SOLN
0.1000 mg/kg/h | INTRAVENOUS | Status: DC
  Administered 2013-01-02: 0.1 mg/kg/h via INTRAVENOUS
  Filled 2013-01-02 (×2): qty 250

## 2013-01-02 MED ORDER — ACETAZOLAMIDE 250 MG PO TABS
500.0000 mg | ORAL_TABLET | Freq: Every day | ORAL | Status: AC
  Administered 2013-01-02 – 2013-01-04 (×3): 500 mg via ORAL
  Filled 2013-01-02 (×3): qty 2

## 2013-01-02 MED ORDER — POTASSIUM CHLORIDE 10 MEQ/50ML IV SOLN
INTRAVENOUS | Status: AC
  Filled 2013-01-02: qty 250

## 2013-01-02 MED ORDER — WARFARIN SODIUM 4 MG PO TABS
4.0000 mg | ORAL_TABLET | Freq: Once | ORAL | Status: DC
  Filled 2013-01-02: qty 1

## 2013-01-02 MED FILL — Dexmedetomidine HCl IV Soln 200 MCG/2ML: INTRAVENOUS | Qty: 2 | Status: AC

## 2013-01-02 MED FILL — Heparin Sodium (Porcine) Inj 1000 Unit/ML: INTRAMUSCULAR | Qty: 30 | Status: AC

## 2013-01-02 MED FILL — Potassium Chloride Inj 2 mEq/ML: INTRAVENOUS | Qty: 40 | Status: AC

## 2013-01-02 MED FILL — Sodium Chloride IV Soln 0.9%: INTRAVENOUS | Qty: 1000 | Status: AC

## 2013-01-02 MED FILL — Magnesium Sulfate Inj 50%: INTRAMUSCULAR | Qty: 10 | Status: AC

## 2013-01-02 NOTE — Progress Notes (Addendum)
Pharmacy Consult Note  Pharmacy asked to peripherally follow LVAD anticoagulation for Kaitlin [name].   INR resulted at 2.57 this morning and bivalirudin was discontinued, repeat INR ~6h after being off bival was 1.93 which is lilkely patient's true INR.  D/w Dr.VanTright and will redraw Chromogenic Factor X now, then restart bival at previous rate of 0.1mg /kg/hr using previous dosing wt of 75kg. 2 hour aptt to follow then q6 hours until reassessed by MD on 9/3.  Called [facility] lab then spoke with Alycia Rossetti (liaison) at [facility] lab regarding 1/61 chromogenic factor x. Due to weekend order and holiday it appears that the lab was not sent until today 9/2 with expected turn around of 2-3 days depending on [facility]'s processing. I asked him to expedite this lab as well as today's ordered lab as much as possible, he is calling [facility] to see if this is possible and will call back with any new expectations on resulting labs.  Liaison's number is [phone] for records.   Pharmacy to continue to follow peripherally. Depending on how quickly lab turn around turns out to be would consider continuing bival until INR >3 with expected false elevation in INR of 0.5. Noted order for repeat of 4mg  of warfarin tonight.  Sheppard Coil PharmD., BCPS Clinical Pharmacist Pager [phone] 01/02/2013 1:46 PM

## 2013-01-02 NOTE — Progress Notes (Addendum)
Rehab Admissions Coordinator Note:  Patient was screened by Clois Dupes for appropriateness for an Inpatient Acute Rehab Consult.  At this time, we are recommending Inpatient Rehab consult when felt appropriate. Recommend OT evaluation also.  Clois Dupes 01/02/2013, 10:55 AM  I can be reached at [phone].

## 2013-01-02 NOTE — Progress Notes (Signed)
Pharmacy Consult Note  Pharmacy Consult for Vancomycin Indication: rule out pneumonia  No Known Allergies  Patient Measurements: Height: 5\' 9"  (175.3 cm) Weight: 181 lb 3.5 oz (82.2 kg) IBW/kg (Calculated) : 66.2  Vital Signs: Temp: 98 F (36.7 C) (09/02 0743) Temp src: Oral (09/02 0743) Pulse Rate: 109 (09/02 0700) Intake/Output from previous day: 09/01 0701 - 09/02 0700 In: 4361.6 [I.V.:2101.6; NG/GT:1160; IV Piggyback:1100] Out: 4125 [Urine:4125] Intake/Output from this shift: Total I/O In: 50 [IV Piggyback:50] Out: -   Labs:  Recent Labs  12/31/12 0400  01/01/13 0420 01/01/13 1600 01/02/13 0400  WBC 19.4*  --  23.8*  --  21.9*  HGB 9.3*  --  9.3*  --  8.6*  PLT 498*  --  594*  --  595*  CREATININE 0.75  < > 0.89 0.90 0.92  < > = values in this interval not displayed. Estimated Creatinine Clearance: 69.9 ml/min (by C-G formula based on Cr of 0.92).  Recent Labs  12/31/12 1930  VANCOTROUGH 21.7*    Assessment: Vancomycin + primaxin for r/o PNA.  WBC elevated at 21.9, but trending down.  SCr stable.  Tm 99.  Vancomycin trough reported as 21.7 on vancomycin 1500 mg IV q18h 8/31 -- dose reduced to q24 hour dosing. UOP down to 2.37ml/kg/hr but still adequate, continues on pressors and lasix drip.  8/19 TA, rare GPC -final 8/23 urine-ng 8/23 TA - ng  8/28 Blood - ngtd  Anticoagulation for LVAD- patient is currently being bridged from bival to warfarin. INR therapeutic x 2 at 2.57 this am 9/2, bival has been turned off. D/w Dr.Bensimhon and will recheck INR 6h after infusion stopped to ensure proper anticoagulation off DTI. Warfarin 4mg  x1 reordered by CVTS for tonight.  Goal of Therapy:  Vancomycin trough level 15-20 mcg/ml INR goal 2.5  Plan:  - Continue vancomycin at 1500 mg IV q24h - will plan on rechecking trough 9/4. - Follow up SCr, UOP, cultures, clinical course and adjust as clinically indicated - INR recheck at noon - Warfarin 4mg  x1 per  MD  Sheppard Coil PharmD., BCPS Clinical Pharmacist Pager [phone] 01/02/2013 8:01 AM

## 2013-01-02 NOTE — Progress Notes (Signed)
**Note De-Identified Lovelyn Sheeran Obfuscation** Rt note: patient removed from BIPAP following HHN and placed on 4l [place].  VS WNL. RT to cont. To monitor

## 2013-01-02 NOTE — Progress Notes (Signed)
Transitioned patient from bipap 12/6 70% fio2 with 10ppm nitric bleed in to Non-Rebreather mask with nitric 10ppm bleed in per Dr Gala Romney.  HR 106, RR 22, o2 Saturation 100%.  Will wean o2.

## 2013-01-02 NOTE — Progress Notes (Signed)
NUTRITION FOLLOW-UP  DOCUMENTATION CODES Per approved criteria  -Not Applicable   INTERVENTION: Continue Vital 1.5 at 40 ml/h with 60 ml Prostat liquid protein BID. Goal regimen provides 1840 kcal, 125 grams protein, 733 ml free water and is currently meeting 100% of estimated protein and calorie needs. Recommend SLP evaluation prior to initiation of POs given lengthy intubation. RD to continue to follow nutrition care plan.  NUTRITION DIAGNOSIS: Inadequate oral intake now related to inability to eat as evidenced by NPO status. Ongoing.  Goal: Intake to meet >90% of estimated nutrition needs. Met.  Monitor:  labs, weight trends, post-op healing and recovery, tolerance of nutrition support  ASSESSMENT: LVAD inserted on 8/18. Pt found to have R ventricular dysfunction s/p LVAD implantation, required trip to OR later that evening for placement of RVAD. RVAD removed 8/25. Underwent sternotomy incision closure and wound VAC wash out 8/26.   Underwent endoscopy on 8/26 - pt found to have Mallory Weiss tear, which was clipped by GI. Post-pyloric tube placed by radiology with the tip at the ligament of Treitz. Vital 1.5 started on 8/28. Extubated 8/31.  Pt is currently receiving Vital 1.5 at 40 ml/hr (goal rate) with 60 ml Prostat liquid protein BID. This regimen provides: 1840 kcal, 125 grams protein, 733 ml free water. Pt with loose stools and rectal tube. RN reports pt is tolerating well. Pt is receiving ice chips.  Height: Ht Readings from Last 1 Encounters:  12/22/12 5\' 9"  (1.753 m)    Weight: Wt Readings from Last 1 Encounters:  01/02/13 181 lb 3.5 oz (82.2 kg)  Admission wt: 165 lb Current wt is up 16 lb.  BMI:  Body mass index is 29.3 kg/(m^2) - using admission weight.  Estimated Nutritional Needs: Kcal: 1800 - 2000 Protein: 90 - 125 gm Fluid: 1.5 - 2 L  Skin:  Abdomen incision Chest incision  Diet Order: NPO  EDUCATION NEEDS: -Education needs addressed. Provided  many handouts on heart healthy eating, including label reading tips per patient request on 8/15.   Intake/Output Summary (Last 24 hours) at 01/02/13 0919 Last data filed at 01/02/13 0900  Gross per 24 hour  Intake 4121.67 ml  Output   4475 ml  Net -353.33 ml    Last BM: 8/31  Labs:   Recent Labs Lab 01/01/13 0420 01/01/13 1600 01/02/13 0400  NA 138 139 140  K 3.2* 3.1* 3.1*  CL 95* 96 94*  CO2 34* 33* 37*  BUN 28* 34* 35*  CREATININE 0.89 0.90 0.92  CALCIUM 9.5 9.2 9.6  GLUCOSE 181* 173* 185*    CBG (last 3)   Recent Labs  01/02/13 0029 01/02/13 0406 01/02/13 0740  GLUCAP 164* 177* 164*   Prealbumin  Date/Time Value Range Status  12/25/2012  3:39 AM 5.9* 17.0 - 34.0 mg/dL Final     Performed at [facility]   Triglycerides  Date/Time Value Range Status  12/25/2012  3:39 AM 159* <150 mg/dL Final    Scheduled Meds: . acetaZOLAMIDE  500 mg Oral Daily  . amiodarone  200 mg Per NG tube BID  . antiseptic oral rinse  15 mL Mouth Rinse QID  . aspirin  324 mg Per Tube Daily  . aspirin  300 mg Rectal Daily  . chlorhexidine  15 mL Mouth Rinse BID  . citalopram  20 mg Oral Daily  . digoxin  0.125 mg Intravenous Daily  . feeding supplement  60 mL Oral BID  . feeding supplement (VITAL 1.5 CAL)  40 mL/hr Per Tube Q24H  . furosemide  20 mg Intravenous Q8H  . imipenem-cilastatin  500 mg Intravenous Q6H  . insulin aspart  0-24 Units Subcutaneous Q4H  . insulin glargine  14 Units Subcutaneous BID  . levalbuterol  1.25 mg Nebulization Q6H  . magic mouthwash  5 mL Oral QID  . metoCLOPramide (REGLAN) injection  10 mg Intravenous Q6H  . pantoprazole sodium  40 mg Per Tube BID  . [COMPLETED] potassium chloride  10 mEq Intravenous Once  . sildenafil  20 mg Per Tube TID  . sodium chloride  10 mL Intravenous Q12H  . vancomycin  1,500 mg Intravenous Q24H  . warfarin  4 mg Oral ONCE-1800  . Warfarin - Physician Dosing Inpatient   Does not apply q1800     Continuous Infusions: . sodium chloride 20 mL/hr at 01/01/13 0400  . sodium chloride 20 mL/hr at 01/02/13 0150  . sodium chloride Stopped (12/28/12 0000)  . sodium chloride 30 mL/hr at 01/01/13 0400  . DOBUTamine 4 mcg/kg/min (01/02/13 0900)  . DOPamine 3 mcg/kg/min (01/02/13 0900)  . lactated ringers Stopped (12/28/12 0000)  . norepinephrine (LEVOPHED) Adult infusion 2.5 mcg/min (01/02/13 0900)    Jarold Motto MS, RD, LDN Pager: [phone] After-hours pager: [phone]

## 2013-01-02 NOTE — Progress Notes (Signed)
Echocardiogram 2D Echocardiogram has been performed.  Kaitlin [name] 01/02/2013, 1:41 PM

## 2013-01-02 NOTE — Evaluation (Signed)
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   Clinical Impression  Pt very pleasant and eager to return to prior independent state. Pt very motivated and demonstrates generalized weakness from prolonged bedrest due to extensive medical procedures and will benefit from acute therapy to maximize strength, function, gait and mobility as well as management of LVAD equipment to increase independence and decrease burden of care. Pt educated to continue bil LE HEP. Discussed with pt progression of switching to batteries but did not perform today, await holster.    PT Assessment  Patient needs continued PT services    Follow Up Recommendations  CIR    Does the patient have the potential to tolerate intense rehabilitation      Barriers to Discharge        Equipment Recommendations  Rolling walker with 5" wheels    Recommendations for Other Services OT consult;Rehab consult   Frequency Min 5X/week    Precautions / Restrictions Precautions Precautions: Sternal;Fall Precaution Comments: LVAD   Pertinent Vitals/Pain HR 112 No pain      Mobility  Bed Mobility Bed Mobility: Rolling Right;Right Sidelying to Sit;Sitting - Scoot to [place] of Bed Rolling Right: 2: Max  assist Right Sidelying to Sit: 1: +2 Total assist;HOB flat Right Sidelying to Sit: Patient Percentage: 30% Sitting - Scoot to Edge of Bed: 3: Mod assist Details for Bed Mobility Assistance: cueing and assist with pad for precautions and sequence with +2 for line maintenance Transfers Transfers: Sit to Stand;Stand to Sit;Stand Pivot Transfers Sit to Stand: 1: +2 Total assist;From bed Sit to Stand: Patient Percentage: 40% Stand to Sit: 1: +2 Total assist;To chair/3-in-1 Stand to Sit: Patient Percentage: 50% Stand Pivot Transfers: 1: +2 Total assist Stand Pivot Transfers: Patient Percentage: 40% Details for Transfer Assistance: bil knees blocked with use of pad to control sacrum and assist with anterior translation with cueing for sequence. bil knee buckling in stance Ambulation/Gait Ambulation/Gait Assistance: Not tested (comment) Stairs: No    Exercises General Exercises - Lower Extremity Long Arc Quad: AROM;Both;10 reps;Seated Hip Flexion/Marching: AROM;Both;10 reps;Seated   PT Diagnosis: Difficulty walking;Generalized weakness  PT Problem List: Decreased strength;Decreased activity tolerance;Decreased balance;Decreased mobility;Cardiopulmonary status limiting activity;Decreased knowledge of precautions;Decreased knowledge of use of DME PT Treatment Interventions: DME instruction;Gait training;Functional mobility training;Therapeutic activities;Therapeutic exercise;Patient/family education     PT Goals(Current goals can be found in the care plan section) Acute Rehab PT Goals Patient Stated Goal: get back to work at the [employer] and do the wobble PT Goal Formulation: With patient Time For Goal Achievement: 01/16/13 Potential to Achieve Goals: Good  Visit Information  Last PT Received On: 01/02/13 Assistance Needed: +2 History of Present Illness: Kaitlin [name] is a 67 y/o woman with h/o severe HTN, [name], VT s/p Biotronik ICD (2009) and 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 EF 15-25% with mild to  moderate RV  dysfunction.Underwent HM II LVAD implant (under destination criteria) along with TV repair on 8/18. HeartMate 2 implantation with subsequent emergency centri- mag RVAD because of RV dysfunction. Patient had stable hemodynamics on BiVAD support.Returned to OR 8-25 for successful removal of RVAD Responsive but comfortable with IV sedation. Had successful delayed sternal closure 8-28-014        Prior Functioning  Home Living Family/patient expects to be discharged to:: Private residence Living Arrangements: Alone Available Help at Discharge: Family;Available 24 hours/day Type of Home: House Home Access: Stairs to enter [employer] of Steps: 5 Entrance Stairs-Rails: Can reach both Home Layout: One level Home Equipment: None Prior Function Level of Independence: Independent Communication Communication: No difficulties Dominant Hand: Right    Cognition  Cognition Arousal/Alertness: Awake/alert Behavior During Therapy: WFL for tasks assessed/performed Overall Cognitive Status: Within Functional Limits for tasks assessed    Extremity/Trunk Assessment Upper Extremity Assessment Upper Extremity Assessment: Generalized weakness Lower Extremity Assessment Lower Extremity Assessment: Generalized weakness Cervical / Trunk Assessment Cervical / Trunk Assessment: Normal   Balance Static Sitting Balance Static Sitting - Balance Support: No upper extremity supported;Feet supported Static Sitting - Level of Assistance: 4: Min assist Static Sitting - Comment/# of Minutes: 4 initial left lean and able to correct to midline after grossly 1 min  End of Session PT - End of Session Activity Tolerance: Patient limited by fatigue Patient left: in chair;with call bell/phone within reach;with nursing/sitter in room Nurse Communication: Mobility status  GP     [name] 01/02/2013, 10:45 AM [name], PT [phone]

## 2013-01-02 NOTE — Progress Notes (Signed)
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 EF 15-25% with mild to moderate RV dysfunction  8/18 Underwent HM II LVAD implant (under destination criteria) along with TV repair.  Taken back to the OR for RVAD support.  8/25 was taken back to OR for RVAD explant. 8/26 EGD which showed MW tear in esophagus. Injected and clipped. No further bleeding.  8/27  Back to OR for chest closure.  8/31 extubated   Yesterday became fatigued and placed on bipap with NO. No BiPAP off. Remains on 3 levophed and dopa 3, dobut 4. NO 10. Diuresing briskly. MAPs 80s. CVP 18. Weight pending. Remains on broad spectrum abx. Feels better asking for ice chips  ABG this am: 7.5/52/11/99% on Bipap  Tele: ST 110   Co-ox 50%-> 57%. INR 2.57.  LVAD INTERROGATION:   HeartMate II LVAD:  Flow -- Liters/min 3.4, speed 8390  power 4.0, PI 6.5  No PI events  Objective:    Vital Signs:   Temp:  [97.7 F (36.5 C)-99.7 F (37.6 C)] 98.5 F (36.9 C) (09/02 0400) Pulse Rate:  [105-118] 109 (09/02 0500) Resp:  [8-32] 25 (09/02 0500) SpO2:  [84 %-100 %] 98 % (09/02 0500) FiO2 (%):  [50 %-80 %] 70 % (09/02 0420) Last BM Date: 12/31/12 Mean arterial Pressure 60-70s  Intake/Output:   Intake/Output Summary (Last 24 hours) at 01/02/13 0533 Last data filed at 01/02/13 0500  Gross per 24 hour  Intake 4159.3 ml  Output   4225 ml  Net  -65.7 ml     Physical Exam: General: Awake on Bipap HEENT: normal Neck: supple. CVP to ear Carotids 2+ bilat; no bruits. No lymphadenopathy or thryomegaly appreciated.  Cor: Mechanical heart sounds with LVAD hum present.;Lungs: course Abdomen: soft, nontender, mildly distended. Hypoactive BS Driveline: C/D/I; securement device intact  Extremities: no cyanosis, clubbing, rash, 2+ edema.  Neuro: awake follows commands moves all 4 extremities w/o difficulty. Affect  pleasant  Telemetry: NSR 110  Labs: Basic Metabolic Panel:  Recent Labs Lab 12/31/12 0400 12/31/12 1400 01/01/13 0420 01/01/13 1600 01/02/13 0400  NA 138 139 138 139 140  K 2.7* 3.6 3.2* 3.1* 3.1*  CL 97 98 95* 96 94*  CO2 31 31 34* 33* 37*  GLUCOSE 152* 137* 181* 173* 185*  BUN 23 27* 28* 34* 35*  CREATININE 0.75 0.84 0.89 0.90 0.92  CALCIUM 9.3 9.2 9.5 9.2 9.6    Liver Function Tests:  Recent Labs Lab 12/29/12 0435 12/30/12 0425 12/31/12 0400 01/01/13 0420 01/02/13 0400  AST 35 44* 41* 39* 29  ALT 20 33 35 35 27  ALKPHOS 163* 191* 186* 202* 164*  BILITOT 1.6* 1.1 1.2 1.2 1.0  PROT 5.2* 5.2* 5.4* 6.0 6.0  ALBUMIN 2.1* 2.2* 2.2* 2.6* 2.6*   No results found for this basename: LIPASE, AMYLASE,  in the last 168 hours No results found for this basename: AMMONIA,  in the last 168 hours  CBC:  Recent Labs Lab 12/29/12 1600 12/30/12 0109 12/30/12 0425 12/31/12 0400 01/01/13 0420 01/02/13 0400  WBC 23.6*  --  22.0* 19.4* 23.8* 21.9*  HGB 9.2* 8.8* 8.9* 9.3* 9.3* 8.6*  HCT 26.5* 26.0* 26.5* 27.7* 28.4* 27.4*  MCV 86.6  --  86.0 86.6 89.0 91.0  PLT 403*  --  447* 498* 594* 595*    INR:  Recent Labs Lab 12/29/12  1349 12/30/12 0425 12/31/12 0400 01/01/13 0420 01/02/13 0400  INR 1.85* 2.23* 2.27* 2.23* 2.57*    Imaging: Dg Chest Port 1 View  01/01/2013   *RADIOLOGY REPORT*  Clinical Data: Post ventricular assist device placement.  PORTABLE CHEST - 1 VIEW  Comparison: 12/31/2012  Findings: The patient has been extubated.  Swan-Ganz catheter remains with the tip in the main pulmonary artery.  Ventricular assist device shows stable positioning.  Lungs demonstrate low volumes with slightly worse bilateral lower lobe atelectasis.  No pneumothorax, pulmonary edema or significant pleural fluid is identified.  IMPRESSION: Slight worsening of bilateral lower lobe atelectasis.   Original Report Authenticated By: Irish Lack, M.D.   Dg Chest Port 1  View  12/31/2012   *RADIOLOGY REPORT*  Clinical Data: Ventricular assist device  PORTABLE CHEST - 1 VIEW  Comparison: 12/30/2012  Findings: The endotracheal tube and feeding catheter are within normal limits.  A Swan-Ganz catheter is noted in the pulmonary outflow tract.  A defibrillator is seen as well as a left ventricular assist device.  Persistent changes are noted in the right mid lung and left lung base.  No new focal abnormality is noted.  IMPRESSION: No change from prior exam.   Original Report Authenticated By: Alcide Clever, M.D.     Medications:     Scheduled Medications: . amiodarone  200 mg Per NG tube BID  . antiseptic oral rinse  15 mL Mouth Rinse QID  . aspirin  324 mg Per Tube Daily  . aspirin  300 mg Rectal Daily  . chlorhexidine  15 mL Mouth Rinse BID  . citalopram  20 mg Oral Daily  . digoxin  0.125 mg Intravenous Daily  . feeding supplement  60 mL Oral BID  . feeding supplement (VITAL 1.5 CAL)  40 mL/hr Per Tube Q24H  . imipenem-cilastatin  500 mg Intravenous Q6H  . insulin aspart  0-24 Units Subcutaneous Q4H  . insulin glargine  12 Units Subcutaneous BID  . levalbuterol  1.25 mg Nebulization Q6H  . magic mouthwash  5 mL Oral QID  . metoCLOPramide (REGLAN) injection  10 mg Intravenous Q6H  . pantoprazole sodium  40 mg Per Tube BID  . potassium chloride  10 mEq Intravenous Q1 Hr x 3  . sildenafil  20 mg Per Tube BID  . sodium chloride  10 mL Intravenous Q12H  . vancomycin  1,500 mg Intravenous Q24H  . Warfarin - Physician Dosing Inpatient   Does not apply q1800    Infusions: . sodium chloride 20 mL/hr at 01/01/13 0400  . sodium chloride 20 mL/hr at 01/02/13 0150  . sodium chloride Stopped (12/28/12 0000)  . sodium chloride 30 mL/hr at 01/01/13 0400  . bivalirudin (ANGIOMAX) infusion 0.5 mg/mL (Non-ACS indications) 0.1 mg/kg/hr (01/01/13 1930)  . dexmedetomidine 0.6 mcg/kg/hr (12/31/12 1002)  . DOBUTamine 4 mcg/kg/min (01/02/13 0051)  . DOPamine 2.991  mcg/kg/min (01/01/13 2253)  . fentaNYL infusion INTRAVENOUS 125 mcg/hr (12/31/12 0400)  . furosemide (LASIX) infusion 4 mg/hr (01/01/13 1013)  . lactated ringers Stopped (12/28/12 0000)  . midazolam (VERSED) infusion 4 mg/hr (12/31/12 0600)  . norepinephrine (LEVOPHED) Adult infusion 3 mcg/min (01/01/13 1900)  . phenylephrine (NEO-SYNEPHRINE) Adult infusion Stopped (12/24/12 0000)    PRN Medications: acetaminophen (TYLENOL) oral liquid 160 mg/5 mL, ALPRAZolam, fentaNYL, midazolam, morphine injection, ondansetron (ZOFRAN) IV, ondansetron (ZOFRAN) IV, oxyCODONE-acetaminophen, sodium chloride, sodium chloride   Assessment:   1. Acute on chronic systolic HF with biventricular HF EF 10%  --S/p HM II VAD  with TV repair 12/18/12  --s/p RVAD. - explanted 8/25 2. 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 Cardiogenic shock  4. VT s/p Biotronik ICD on amio 5. Chronic renal failure, stage III 6. 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 ABL anemia  8. PNA 9. Severe protein calorie malnutrition - on TPN 10. Hyponatremia 11. GI bleeding with [place] Artist tear  Plan/Discussion:    Improving steadily. Respiratory status still a bit tenuous. Continue nitric. Will check ABG in one hour.  Continue to diurese. Would continue inotropes until fluid status is better.   INR 2.5 - can stop bival. Will start mobilizing. Supp K+ with 6 runs. (4 ordered, I ordered 2 more).  The patient is critically ill with multiple organ systems failure and requires high complexity decision making for assessment and support, frequent evaluation and titration of therapies, application of advanced monitoring technologies and extensive interpretation of multiple databases.   Critical Care Time devoted to patient care services described in this note is 35 Minutes.  [name] 5:33 AM Length of Stay: 25 VAD Team Pager [phone] (7am - 7am) VAD Issue  Non-VAD issues HF Pager [phone]

## 2013-01-03 ENCOUNTER — Inpatient Hospital Stay (HOSPITAL_COMMUNITY): Payer: Commercial Managed Care - PPO

## 2013-01-03 DIAGNOSIS — I5022 Chronic systolic (congestive) heart failure: Secondary | ICD-10-CM

## 2013-01-03 DIAGNOSIS — I428 Other cardiomyopathies: Secondary | ICD-10-CM

## 2013-01-03 DIAGNOSIS — Z95818 Presence of other cardiac implants and grafts: Secondary | ICD-10-CM

## 2013-01-03 LAB — APTT: aPTT: 80 seconds — ABNORMAL HIGH (ref 24–37)

## 2013-01-03 LAB — COMPREHENSIVE METABOLIC PANEL
ALT: 27 U/L (ref 0–35)
AST: 33 U/L (ref 0–37)
Albumin: 2.7 g/dL — ABNORMAL LOW (ref 3.5–5.2)
Alkaline Phosphatase: 154 U/L — ABNORMAL HIGH (ref 39–117)
BUN: 44 mg/dL — ABNORMAL HIGH (ref 6–23)
CO2: 38 mEq/L — ABNORMAL HIGH (ref 19–32)
Calcium: 9.9 mg/dL (ref 8.4–10.5)
Chloride: 100 mEq/L (ref 96–112)
Creatinine, Ser: 0.98 mg/dL (ref 0.50–1.10)
GFR calc Af Amer: 69 mL/min — ABNORMAL LOW (ref >90)
GFR calc non Af Amer: 59 mL/min — ABNORMAL LOW (ref >90)
Glucose, Bld: 148 mg/dL — ABNORMAL HIGH (ref 70–99)
Potassium: 3 mEq/L — ABNORMAL LOW (ref 3.5–5.1)
Sodium: 147 mEq/L — ABNORMAL HIGH (ref 135–145)
Total Bilirubin: 1 mg/dL (ref 0.3–1.2)
Total Protein: 5.9 g/dL — ABNORMAL LOW (ref 6.0–8.3)

## 2013-01-03 LAB — CULTURE, BLOOD (SINGLE): Culture: NO GROWTH

## 2013-01-03 LAB — CARBOXYHEMOGLOBIN
Carboxyhemoglobin: 1.3 % (ref 0.5–1.5)
Methemoglobin: 0.8 % (ref 0.0–1.5)
O2 Saturation: 55.8 %
Total hemoglobin: 8.9 g/dL — ABNORMAL LOW (ref 12.0–16.0)

## 2013-01-03 LAB — CBC
HCT: 27.5 % — ABNORMAL LOW (ref 36.0–46.0)
Hemoglobin: 8.7 g/dL — ABNORMAL LOW (ref 12.0–15.0)
MCH: 29.1 pg (ref 26.0–34.0)
MCHC: 31.6 g/dL (ref 30.0–36.0)
MCV: 92 fL (ref 78.0–100.0)
Platelets: 621 10*3/uL — ABNORMAL HIGH (ref 150–400)
RBC: 2.99 MIL/uL — ABNORMAL LOW (ref 3.87–5.11)
RDW: 16.5 % — ABNORMAL HIGH (ref 11.5–15.5)
WBC: 23.2 10*3/uL — ABNORMAL HIGH (ref 4.0–10.5)

## 2013-01-03 LAB — GLUCOSE, CAPILLARY
Glucose-Capillary: 114 mg/dL — ABNORMAL HIGH (ref 70–99)
Glucose-Capillary: 127 mg/dL — ABNORMAL HIGH (ref 70–99)
Glucose-Capillary: 144 mg/dL — ABNORMAL HIGH (ref 70–99)
Glucose-Capillary: 156 mg/dL — ABNORMAL HIGH (ref 70–99)

## 2013-01-03 LAB — POCT I-STAT 3, ART BLOOD GAS (G3+)
Acid-Base Excess: 13 mmol/L — ABNORMAL HIGH (ref 0.0–2.0)
Bicarbonate: 37.1 mEq/L — ABNORMAL HIGH (ref 20.0–24.0)
TCO2: 38 mmol/L (ref 0–100)
pO2, Arterial: 64 mmHg — ABNORMAL LOW (ref 80.0–100.0)

## 2013-01-03 LAB — POCT I-STAT 4, (NA,K, GLUC, HGB,HCT)
Glucose, Bld: 141 mg/dL — ABNORMAL HIGH (ref 70–99)
HCT: 28 % — ABNORMAL LOW (ref 36.0–46.0)

## 2013-01-03 LAB — PROTIME-INR
INR: 2.91 — ABNORMAL HIGH (ref 0.00–1.49)
INR: 2.15 — ABNORMAL HIGH (ref 0.00–1.49)
Prothrombin Time: 29.4 seconds — ABNORMAL HIGH (ref 11.6–15.2)

## 2013-01-03 LAB — LACTATE DEHYDROGENASE: LDH: 526 U/L — ABNORMAL HIGH (ref 94–250)

## 2013-01-03 MED ORDER — INSULIN GLARGINE 100 UNIT/ML ~~LOC~~ SOLN
18.0000 [IU] | Freq: Two times a day (BID) | SUBCUTANEOUS | Status: DC
  Administered 2013-01-03 – 2013-01-05 (×4): 18 [IU] via SUBCUTANEOUS
  Filled 2013-01-03 (×5): qty 0.18

## 2013-01-03 MED ORDER — POTASSIUM CHLORIDE 10 MEQ/50ML IV SOLN
10.0000 meq | INTRAVENOUS | Status: AC | PRN
  Administered 2013-01-03 (×3): 10 meq via INTRAVENOUS

## 2013-01-03 MED ORDER — POTASSIUM CHLORIDE 10 MEQ/50ML IV SOLN
10.0000 meq | INTRAVENOUS | Status: AC
  Administered 2013-01-03 – 2013-01-04 (×5): 10 meq via INTRAVENOUS

## 2013-01-03 MED ORDER — FLUCONAZOLE IN SODIUM CHLORIDE 400-0.9 MG/200ML-% IV SOLN
400.0000 mg | INTRAVENOUS | Status: DC
  Administered 2013-01-03 – 2013-01-04 (×2): 400 mg via INTRAVENOUS
  Filled 2013-01-03 (×3): qty 200

## 2013-01-03 MED ORDER — FLUCONAZOLE IN SODIUM CHLORIDE 400-0.9 MG/200ML-% IV SOLN: 400.0000 mg | INTRAVENOUS | Status: DC

## 2013-01-03 MED ORDER — DIGOXIN 125 MCG PO TABS
0.1250 mg | ORAL_TABLET | Freq: Every day | ORAL | Status: DC
  Administered 2013-01-03 – 2013-01-15 (×13): 0.125 mg via ORAL
  Filled 2013-01-03 (×13): qty 1

## 2013-01-03 MED ORDER — FLUCONAZOLE IN SODIUM CHLORIDE 400-0.9 MG/200ML-% IV SOLN
800.0000 mg | Freq: Once | INTRAVENOUS | Status: DC
  Filled 2013-01-03 (×2): qty 400

## 2013-01-03 MED ORDER — WARFARIN SODIUM 5 MG PO TABS
5.0000 mg | ORAL_TABLET | Freq: Once | ORAL | Status: AC
  Administered 2013-01-03: 5 mg via ORAL
  Filled 2013-01-03: qty 1

## 2013-01-03 MED ORDER — LIDOCAINE HCL (CARDIAC) 20 MG/ML IV SOLN
100.0000 mg | Freq: Once | INTRAVENOUS | Status: AC
  Administered 2013-01-03: 100 mg via INTRAVENOUS

## 2013-01-03 MED ORDER — WARFARIN SODIUM 4 MG PO TABS
4.0000 mg | ORAL_TABLET | Freq: Once | ORAL | Status: DC
  Filled 2013-01-03: qty 1

## 2013-01-03 MED ORDER — POTASSIUM CHLORIDE 10 MEQ/50ML IV SOLN
INTRAVENOUS | Status: AC
  Filled 2013-01-03: qty 150

## 2013-01-03 MED ORDER — SODIUM CHLORIDE 0.9 % IV SOLN
500.0000 mg | Freq: Three times a day (TID) | INTRAVENOUS | Status: DC
  Administered 2013-01-03 – 2013-01-05 (×6): 500 mg via INTRAVENOUS
  Filled 2013-01-03 (×8): qty 500

## 2013-01-03 MED ORDER — MAGNESIUM SULFATE 40 MG/ML IJ SOLN
2.0000 g | Freq: Once | INTRAMUSCULAR | Status: AC
Start: 2013-01-03 — End: 2013-01-04
  Administered 2013-01-03: 2 g via INTRAVENOUS
  Filled 2013-01-03: qty 50

## 2013-01-03 MED ORDER — TORSEMIDE 20 MG PO TABS
20.0000 mg | ORAL_TABLET | Freq: Two times a day (BID) | ORAL | Status: DC
  Administered 2013-01-03 – 2013-01-05 (×5): 20 mg via ORAL
  Filled 2013-01-03 (×7): qty 1

## 2013-01-03 MED ORDER — SILDENAFIL CITRATE 20 MG PO TABS
40.0000 mg | ORAL_TABLET | Freq: Three times a day (TID) | ORAL | Status: DC
  Administered 2013-01-03 – 2013-01-06 (×12): 40 mg
  Administered 2013-01-07: 60 mg
  Filled 2013-01-03 (×15): qty 2

## 2013-01-03 MED ORDER — POTASSIUM CHLORIDE 10 MEQ/50ML IV SOLN
INTRAVENOUS | Status: AC
  Filled 2013-01-03: qty 100

## 2013-01-03 MED ORDER — POTASSIUM CHLORIDE 10 MEQ/50ML IV SOLN
INTRAVENOUS | Status: AC
  Filled 2013-01-03: qty 250

## 2013-01-03 MED ORDER — POTASSIUM CHLORIDE 10 MEQ/50ML IV SOLN
10.0000 meq | INTRAVENOUS | Status: AC
  Administered 2013-01-03 (×2): 10 meq via INTRAVENOUS

## 2013-01-03 NOTE — Progress Notes (Signed)
. HeartMate 2 Rounding Note  Subjective:   Postop day #16   HeartMate 2 implantation with subsequent emergency centri- mag RVAD because of  RV dysfunction. Patient  had  stable hemodynamics on BiVAD support.Returned to OR 8-25 for successful removal of RVAD   Responsive but comfortable with IV sedation.  Had successful delayed sternal closure  8-28-014  Patient continues to have stable hemodynamics after chest closure  Patient was successfully extubated postop day #13. She has since been out of bed to chair. Low-dose nitroglycerin dioxide 10 ppm has been added to her nasal cannula oxygen circuit.  A postop, postextubation 2-D echocardiogram shows significant improvement in RV function and decrease in tricuspid regurgitation  Patient's mental status and neuro status is intact. She remains generally weak but is now receiving physical therapy.  Plan speech therapy evaluation for swallowing    Patient in process of transitioning from intravenous bivalirudin to oral Coumadin for adequate anticoagulation of LVAD pump. INR today 2.9  UGI bleed from M-W tear- now inactive, on protonix  Feeding tube post-pyloric and Vital 1.5 started--rectal tube placed for diarrhea tube feeds at goal   diuresis for fluid overload- lasix drip now at 4mg /hr  WBC >30k> 25>20>18 with procalcitonin 1.3>1.0 on Vanc, Primaxin and short course of IV Diflucan. Cultures neg  Patient was extubated yesterday with stable O2 sats, CXR LVAD INTERROGATION:  HeartMate II LVAD:  Flow 3.5 liters/min, speed 8400 power 3.2, PI 4.7.   Objective:    Vital Signs:   Temp:  [97.5 F (36.4 C)-98.4 F (36.9 C)] 98 F (36.7 C) (09/03 1149) Pulse Rate:  [27-109] 99 (09/03 1200) Resp:  [14-35] 23 (09/03 1200) SpO2:  [35 %-100 %] 91 % (09/03 1200) FiO2 (%):  [35 %-100 %] 100 % (09/03 1032) Weight:  [181 lb (82.1 kg)] 181 lb (82.1 kg) (09/03 0600) Last BM Date: 12/31/12 Mean arterial Pressure 84  Intake/Output:   Intake/Output  Summary (Last 24 hours) at 01/03/13 1243 Last data filed at 01/03/13 1200  Gross per 24 hour  Intake 2348.25 ml  Output   4370 ml  Net -2021.75 ml     Physical Exam: General: extubated, responsive, comfortable HEENT: normal Neck: supple. JVP normal  No lymphadenopathy or thryomegaly appreciated. Cor: Mechanical heart sounds with LVAD hum present. Lungs: clear Abdomen: soft, nontender, nondistended. No hepatosplenomegaly. No bruits or masses. Good bowel sounds. Extremities: no cyanosis, clubbing, rash,  Sig edema remains  extremities warm Neuro: moves all 4 extremities  Telemetry: sinus 104/min  Labs: Basic Metabolic Panel:  Recent Labs Lab 01/01/13 0420 01/01/13 1600 01/02/13 0400 01/02/13 1600 01/03/13 0335  NA 138 139 140 142 147*  K 3.2* 3.1* 3.1* 2.9* 3.0*  CL 95* 96 94* 97 100  CO2 34* 33* 37* 35* 38*  GLUCOSE 181* 173* 185* 185* 148*  BUN 28* 34* 35* 37* 44*  CREATININE 0.89 0.90 0.92 0.89 0.98  CALCIUM 9.5 9.2 9.6 9.5 9.9    Liver Function Tests:  Recent Labs Lab 12/30/12 0425 12/31/12 0400 01/01/13 0420 01/02/13 0400 01/03/13 0335  AST 44* 41* 39* 29 33  ALT 33 35 35 27 27  ALKPHOS 191* 186* 202* 164* 154*  BILITOT 1.1 1.2 1.2 1.0 1.0  PROT 5.2* 5.4* 6.0 6.0 5.9*  ALBUMIN 2.2* 2.2* 2.6* 2.6* 2.7*   No results found for this basename: LIPASE, AMYLASE,  in the last 168 hours No results found for this basename: AMMONIA,  in the last 168 hours  CBC:  Recent  Labs Lab 12/30/12 0425 12/31/12 0400 01/01/13 0420 01/02/13 0400 01/03/13 0335  WBC 22.0* 19.4* 23.8* 21.9* 23.2*  HGB 8.9* 9.3* 9.3* 8.6* 8.7*  HCT 26.5* 27.7* 28.4* 27.4* 27.5*  MCV 86.0 86.6 89.0 91.0 92.0  PLT 447* 498* 594* 595* 621*    INR:  Recent Labs Lab 01/01/13 0420 01/02/13 0400 01/02/13 1120 01/03/13 0335 01/03/13 1000  INR 2.23* 2.57* 1.93* 2.91* 2.15*    Other results:  EKG:   Imaging: Dg Chest Port 1 View  01/03/2013   *RADIOLOGY REPORT*  Clinical Data:  Left ventricular assist device.  PORTABLE CHEST - 1 VIEW  Comparison: 01/02/2013  Findings: Again noted is a left ventricular assist device.  There is a left cardiac ICD.  A feeding tube extends into the abdomen. Right central line tip appears to extend to the right atrium and unchanged.  There are stable patchy densities in the right lung with more confluent disease in the mid aspect.  These findings are unchanged.  Slightly improved aeration in the left lung.  Heart size appears to be upper limits of normal but unchanged.  Haziness at the right lung base may represent pleural fluid.  The left jugular central line has been removed.  IMPRESSION: Persistent patchy disease in the right lung and suspect right pleural fluid.  Slightly improved aeration in the left lung.  Support apparatuses as described.   Original Report Authenticated By: Richarda Overlie, M.D.   Dg Chest Port 1 View  01/02/2013   *RADIOLOGY REPORT*  Clinical Data: Left ventricular assist device.  No acute on chronic systolic heart failure.  PORTABLE CHEST - 1 VIEW  Comparison: Single view of the chest 01/01/2013 and 01/01/1939.  Findings: Left IJ approach Swan-Ganz catheter has been removed with a sheath still in place in the left internal jugular vein.  Feeding tube and right subclavian catheter remain in place.  There is scattered bilateral atelectasis, not markedly changed.  Likely small right pleural effusion is noted.  Cardiomegaly is seen.  No pneumothorax.  IMPRESSION:  1.  Status post removal of Swan-Ganz catheter. 2.  No marked change in scattered bilateral atelectasis.  Small right effusion is noted. 3.  Cardiomegaly without edema.   Original Report Authenticated By: Holley Dexter, M.D.     Medications:     Scheduled Medications: . acetaZOLAMIDE  500 mg Oral Daily  . amiodarone  200 mg Per NG tube BID  . antiseptic oral rinse  15 mL Mouth Rinse QID  . aspirin  324 mg Per Tube Daily  . aspirin  300 mg Rectal Daily  . chlorhexidine   15 mL Mouth Rinse BID  . citalopram  20 mg Oral Daily  . digoxin  0.125 mg Intravenous Daily  . feeding supplement  60 mL Oral BID  . feeding supplement (VITAL 1.5 CAL)  40 mL/hr Per Tube Q24H  . imipenem-cilastatin  500 mg Intravenous Q6H  . insulin aspart  0-24 Units Subcutaneous Q4H  . insulin glargine  18 Units Subcutaneous BID  . levalbuterol  1.25 mg Nebulization Q6H  . magic mouthwash  5 mL Oral QID  . metoCLOPramide (REGLAN) injection  10 mg Intravenous Q6H  . pantoprazole sodium  40 mg Per Tube BID  . sildenafil  40 mg Per Tube TID  . sodium chloride  10 mL Intravenous Q12H  . torsemide  20 mg Oral BID  . vancomycin  1,500 mg Intravenous Q24H  . warfarin  4 mg Oral ONCE-1800  . Warfarin -  Physician Dosing Inpatient   Does not apply q1800    Infusions: . sodium chloride 20 mL/hr at 01/01/13 0400  . sodium chloride 20 mL/hr at 01/02/13 0150  . sodium chloride Stopped (12/28/12 0000)  . sodium chloride 30 mL/hr at 01/01/13 0400  . DOBUTamine 3 mcg/kg/min (01/03/13 1112)  . DOPamine 3 mcg/kg/min (01/03/13 1100)  . lactated ringers Stopped (12/28/12 0000)  . norepinephrine (LEVOPHED) Adult infusion 1 mcg/min (01/03/13 0800)    PRN Medications: acetaminophen (TYLENOL) oral liquid 160 mg/5 mL, ALPRAZolam, ondansetron (ZOFRAN) IV, ondansetron (ZOFRAN) IV, oxyCODONE-acetaminophen, sodium chloride, sodium chloride, traMADol   Assessment:  1 status post implantable LVAD with subsequent severe RV dysfunction requiring right ventricular assist device- now removed after 1 week support 2 coagulopathy and bleeding requiring transfusion now improved 3 UGI bleeding- M-W tear, treated 4 Chest  Closure successful  Plan/Discussion:   Anticoagulation with bival/ coumadin- now off bival. Goal INR 2.5 Support native RV fx-low-dose dopamine, dobutamine,  revatio started  Nutrition w/ TF started now  at goal per nutrition  Sternal incision inspected and is intact and dry  Transition  Lasix drip to oral Demadex  PT input, OOB to chair  I reviewed the LVAD parameters from today, and compared the results to the patient's prior recorded data.  No programming changes were made.  The LVAD is functioning within specified parameters.  The patient performs LVAD self-test daily.  LVAD interrogation was negative for any significant power changes, alarms or PI events/speed drops.  LVAD equipment check completed and is in good working order.  Back-up equipment present.   LVAD education done on emergency procedures and precautions and reviewed exit site care.  Length of Stay: 26  [name] III,PETER 01/03/2013, 12:43 PM

## 2013-01-03 NOTE — Progress Notes (Signed)
Spoke with Dr. Donata Clay regarding repeated, frequent runs of v-tach. Order for Lidocaine 100mg  IV push and 2gm Magnesium IV received. Thresa Ross RN

## 2013-01-03 NOTE — Progress Notes (Signed)
After discontinuing Nitric, Pt's O2 sats dropped as low as 73% while on 50% VM. Gave time for pt to recover from drop in sats, and when she didn't come up, placed on 100% NRB. Pt sats slowly coming up, and are currently at 85%. RN notified. RT will continue to monitor.

## 2013-01-03 NOTE — Progress Notes (Signed)
IS done by RT. Pt achieved around x 10. Pt still maintaining O2 sat of around 88-89 on 100% NRB. Verbal order to place pt back on Nitric at 10ppm with [place] and check ABG in about 30 min. RT will continue to monitor.

## 2013-01-03 NOTE — Progress Notes (Signed)
Changed VAD dressing using daily dressing kit and aquacel strip.  Site with small amount dark bloody drainage; no tenderness, erythema, or foul odor noted.  Outer suture removed; one suture remains with tissue ingrowth noted.

## 2013-01-03 NOTE — Progress Notes (Signed)
Pharmacy Consult Note  Pharmacy Consult for fluconazole Indication: rule out pneumonia/fungemia/persistent leukocytosis  No Known Allergies  Patient Measurements: Height: 5\' 9"  (175.3 cm) Weight: 181 lb (82.1 kg) IBW/kg (Calculated) : 66.2  Vital Signs: Temp: 98 F (36.7 C) (09/03 1149) Temp src: Oral (09/03 1149) Pulse Rate: 99 (09/03 1200) Intake/Output from previous day: 09/02 0701 - 09/03 0700 In: 2226.7 [I.V.:516.7; NG/GT:960; IV Piggyback:750] Out: 4885 [Urine:4885] Intake/Output from this shift: Total I/O In: 683.7 [I.V.:183.7; NG/GT:300; IV Piggyback:200] Out: 975 [Urine:975]  Labs:  Recent Labs  01/01/13 0420  01/02/13 0400 01/02/13 1600 01/03/13 0335  WBC 23.8*  --  21.9*  --  23.2*  HGB 9.3*  --  8.6*  --  8.7*  PLT 594*  --  595*  --  621*  CREATININE 0.89  < > 0.92 0.89 0.98  < > = values in this interval not displayed. Estimated Creatinine Clearance: 65.6 ml/min (by C-G formula based on Cr of 0.98).  Recent Labs  12/31/12 1930  VANCOTROUGH 21.7*    Assessment: Vancomycin + primaxin for r/o PNA.  WBC elevated at 23 trending up now.  SCr stable.  Afebrille.  New orders received to start empiric fluconazole given persistent leukocytosis.  8/19 TA, rare GPC -final 8/23 urine-ng 8/23 TA - ng  8/28 Blood - ngtd  Anticoagulation for LVAD- patient is currently being bridged from bival to warfarin. INR therapeuticat 2.15 this am 9/3 after bival off for 4 hours (2.9 prior). Warfarin 4mg  x1 ordered by CVTS for tonight. Plan on checking vancomycin trough tomorrow morning.  Goal of Therapy:  Vancomycin trough level 15-20 mcg/ml INR goal 2.5  Plan:  - Continue vancomycin at 1500 mg IV q24h - will plan on checking trough 9/4. - Follow up SCr, UOP, cultures, clinical course and adjust as clinically indicated - Warfarin 4mg  x1 per MD - Fluconazole 800mg  IV x1 then 400mg  IV daily - LOT based on clinical progression  Sheppard Coil PharmD., BCPS Clinical  Pharmacist Pager [phone] 01/03/2013 1:07 PM

## 2013-01-03 NOTE — Progress Notes (Addendum)
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 EF 15-25% with mild to moderate RV dysfunction  8/18 Underwent HM II LVAD implant (under destination criteria) along with TV repair.  Taken back to the OR for RVAD support.  8/25 was taken back to OR for RVAD explant. 8/26 EGD which showed MW tear in esophagus. Injected and clipped. No further bleeding.  8/27  Back to OR for chest closure.  8/31 extubated  Levophed being weaned. Now down to 1.5 Remains on dopa 3, dobut 4. NO 10. Continues to diurese but weight unchanged. Remains on broad spectrum abx. CVP 12-15. MAPs 70s.  Was able to get out of bed with lift yesterday. Feels stronger. No dyspnea.   K 3.0. WBC going back up again. Now 23K.  Tele: ST 105   Co-ox 56%  INR 2.91 .  LVAD INTERROGATION:   HeartMate II LVAD:  Flow 4.1 Liters/min  speed 8390  power 4.0, PI 6.7   5  PI events  Objective:    Vital Signs:   Temp:  [97.5 F (36.4 C)-98.4 F (36.9 C)] 98.4 F (36.9 C) (09/03 0403) Pulse Rate:  [27-113] 79 (09/03 0600) Resp:  [14-35] 22 (09/03 0600) SpO2:  [88 %-100 %] 100 % (09/03 0600) FiO2 (%):  [35 %-100 %] 35 % (09/03 0600) Weight:  [82.1 kg (181 lb)] 82.1 kg (181 lb) (09/03 0600) Last BM Date: 12/31/12 Mean arterial Pressure 60-70s  Intake/Output:   Intake/Output Summary (Last 24 hours) at 01/03/13 0733 Last data filed at 01/03/13 0700  Gross per 24 hour  Intake 2176.72 ml  Output   4885 ml  Net -2708.28 ml     Physical Exam: General: Awake on NRB HEENT: normal Neck: supple. JVP to ear Carotids 2+ bilat; no bruits. No lymphadenopathy or thryomegaly appreciated.  Cor: LVAD hum present.;Lungs: course Abdomen: soft, nontender, mildly distended. Hypoactive BS Driveline: C/D/I; securement device intact  Extremities: no cyanosis, clubbing, rash, 2+ edema.  Neuro: awake follows commands moves all 4 extremities  w/o difficulty. Affect pleasant  Telemetry: NSR 105  Labs: Basic Metabolic Panel:  Recent Labs Lab 01/01/13 0420 01/01/13 1600 01/02/13 0400 01/02/13 1600 01/03/13 0335  NA 138 139 140 142 147*  K 3.2* 3.1* 3.1* 2.9* 3.0*  CL 95* 96 94* 97 100  CO2 34* 33* 37* 35* 38*  GLUCOSE 181* 173* 185* 185* 148*  BUN 28* 34* 35* 37* 44*  CREATININE 0.89 0.90 0.92 0.89 0.98  CALCIUM 9.5 9.2 9.6 9.5 9.9    Liver Function Tests:  Recent Labs Lab 12/30/12 0425 12/31/12 0400 01/01/13 0420 01/02/13 0400 01/03/13 0335  AST 44* 41* 39* 29 33  ALT 33 35 35 27 27  ALKPHOS 191* 186* 202* 164* 154*  BILITOT 1.1 1.2 1.2 1.0 1.0  PROT 5.2* 5.4* 6.0 6.0 5.9*  ALBUMIN 2.2* 2.2* 2.6* 2.6* 2.7*   No results found for this basename: LIPASE, AMYLASE,  in the last 168 hours No results found for this basename: AMMONIA,  in the last 168 hours  CBC:  Recent Labs Lab 12/30/12 0425 12/31/12 0400 01/01/13 0420 01/02/13 0400 01/03/13 0335  WBC 22.0* 19.4* 23.8* 21.9* 23.2*  HGB 8.9* 9.3* 9.3* 8.6* 8.7*  HCT 26.5* 27.7* 28.4* 27.4* 27.5*  MCV 86.0 86.6 89.0 91.0 92.0  PLT 447* 498* 594* 595* 621*    INR:  Recent  Labs Lab 12/31/12 0400 01/01/13 0420 01/02/13 0400 01/02/13 1120 01/03/13 0335  INR 2.27* 2.23* 2.57* 1.93* 2.91*    Imaging: Dg Chest Port 1 View  01/02/2013   *RADIOLOGY REPORT*  Clinical Data: Left ventricular assist device.  No acute on chronic systolic heart failure.  PORTABLE CHEST - 1 VIEW  Comparison: Single view of the chest 01/01/2013 and 01/01/1939.  Findings: Left IJ approach Swan-Ganz catheter has been removed with a sheath still in place in the left internal jugular vein.  Feeding tube and right subclavian catheter remain in place.  There is scattered bilateral atelectasis, not markedly changed.  Likely small right pleural effusion is noted.  Cardiomegaly is seen.  No pneumothorax.  IMPRESSION:  1.  Status post removal of Swan-Ganz catheter. 2.  No marked change  in scattered bilateral atelectasis.  Small right effusion is noted. 3.  Cardiomegaly without edema.   Original Report Authenticated By: Holley Dexter, M.D.     Medications:     Scheduled Medications: . acetaZOLAMIDE  500 mg Oral Daily  . amiodarone  200 mg Per NG tube BID  . antiseptic oral rinse  15 mL Mouth Rinse QID  . aspirin  324 mg Per Tube Daily  . aspirin  300 mg Rectal Daily  . chlorhexidine  15 mL Mouth Rinse BID  . citalopram  20 mg Oral Daily  . digoxin  0.125 mg Intravenous Daily  . feeding supplement  60 mL Oral BID  . feeding supplement (VITAL 1.5 CAL)  40 mL/hr Per Tube Q24H  . furosemide  20 mg Intravenous Q8H  . imipenem-cilastatin  500 mg Intravenous Q6H  . insulin aspart  0-24 Units Subcutaneous Q4H  . insulin glargine  14 Units Subcutaneous BID  . levalbuterol  1.25 mg Nebulization Q6H  . magic mouthwash  5 mL Oral QID  . metoCLOPramide (REGLAN) injection  10 mg Intravenous Q6H  . pantoprazole sodium  40 mg Per Tube BID  . sildenafil  20 mg Per Tube TID  . sodium chloride  10 mL Intravenous Q12H  . vancomycin  1,500 mg Intravenous Q24H  . Warfarin - Physician Dosing Inpatient   Does not apply q1800    Infusions: . sodium chloride 20 mL/hr at 01/01/13 0400  . sodium chloride 20 mL/hr at 01/02/13 0150  . sodium chloride Stopped (12/28/12 0000)  . sodium chloride 30 mL/hr at 01/01/13 0400  . bivalirudin (ANGIOMAX) infusion 0.5 mg/mL (Non-ACS indications) 0.1 mg/kg/hr (01/03/13 0700)  . DOBUTamine 4 mcg/kg/min (01/03/13 0700)  . DOPamine 3 mcg/kg/min (01/03/13 0700)  . lactated ringers Stopped (12/28/12 0000)  . norepinephrine (LEVOPHED) Adult infusion 1.5 mcg/min (01/03/13 0700)    PRN Medications: acetaminophen (TYLENOL) oral liquid 160 mg/5 mL, ALPRAZolam, ondansetron (ZOFRAN) IV, ondansetron (ZOFRAN) IV, oxyCODONE-acetaminophen, potassium chloride, sodium chloride, sodium chloride, traMADol   Assessment:   1. Acute on chronic systolic HF with  biventricular HF EF 10%  --S/p HM II VAD with TV repair 12/18/12  --s/p RVAD. - explanted 8/25 2. 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 Cardiogenic shock  4. VT s/p Biotronik ICD on amio 5. Chronic renal failure, stage III 6. 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 ABL anemia  8. PNA 9. Severe protein calorie malnutrition - on TPN 10. Hyponatremia/Hypokalemia 11. 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 Acute respiratory failure  Plan/Discussion:    Improving steadily. Respiratory status still a bit tenuous but improving. Continue nitric.  Wean levophed to  off.  Continue to diurese.  She is on Lasix 20 IV q 8. Will switch to demadex and see if she responds. Would continue inotropes until fluid status is better. Will increase sildenafil to 40 tid as BP tolerates. Review echo to see if we can increase speed to 8600.   INR 2.9 - can stop bival and check INR in 2 hours.   Continue PT.   Supp K+ with 4 runs.  WBC is climbing again. Will recheck c.diff. Can consider a short course of antifungals. Will d/w Dr. Donata Clay.   The patient is critically ill with multiple organ systems failure and requires high complexity decision making for assessment and support, frequent evaluation and titration of therapies, application of advanced monitoring technologies and extensive interpretation of multiple databases.   Critical Care Time devoted to patient care services described in this note is 35 Minutes.  [name] 7:33 AM Length of Stay: 26 VAD Team Pager [phone] (7am - 7am) VAD Issue  Non-VAD issues HF Pager [phone]

## 2013-01-03 NOTE — Evaluation (Signed)
Clinical/Bedside Swallow Evaluation Patient Details  Name: Kaitlin [name] MRN: [id number] Date of Birth: [birth year]  Today's Date: 01/03/2013 Time: 4098-1191 SLP Time Calculation (min): 59 min  Past Medical History:  Past Medical History  Diagnosis Date  . Paroxysmal supraventricular tachycardia   . Hypokalemia   . Chronic systolic heart failure     a. 08/7827 Echo: EF 15%, mod to sev antlat and inflat HK, inf AK, mild to mod MR, mildly/mod reduced RV fxn, Mod TR, PASP .  [name] History of DVT (deep vein thrombosis)   . Dyslipidemia   . HTN (hypertension)   . Nonischemic cardiomyopathy     a. 06/2012 Echo: EF 15%;  b. 06/2012 Cath: nl except luminal irregs in LAD.  [name] Ventricular tachycardia     a. s/p ICD  . Goiter 2001  . Implantable cardiac defibrillator- Biotronik 2009    Single chamber  . [name] (left bundle branch block)     intermittent  . Palliative care patient    Past Surgical History:  Past Surgical History  Procedure Laterality Date  . None    . Cardiac defibrillator placement  01/2009  . Insertion of implantable left ventricular assist device N/A 12/18/2012    Procedure: INSERTION OF IMPLANTABLE LEFT VENTRICULAR ASSIST DEVICE;  Surgeon: Kerin Perna, MD;  Location: [facility] OR;  Service: Open Heart Surgery;  Laterality: N/A;  . Intraoperative transesophageal echocardiogram N/A 12/18/2012    Procedure: INTRAOPERATIVE TRANSESOPHAGEAL ECHOCARDIOGRAM;  Surgeon: Kerin Perna, MD;  Location: [facility] OR;  Service: Open Heart Surgery;  Laterality: N/A;  . Tricuspid valve replacement N/A 12/18/2012    Procedure: TRICUSPID VALVE REPAIR;  Surgeon: Kerin Perna, MD;  Location: [facility] OR;  Service: Open Heart Surgery;  Laterality: N/A;  . Insertion of implantable left ventricular assist device N/A 12/18/2012    Procedure: INSERTION OF IMPLANTABLE RIGHT VENTRICULAR ASSIST DEVICE;  Surgeon: Kerin Perna, MD;  Location: [facility] OR;  Service: Open Heart Surgery;  Laterality: N/A;  . Removal  of centrimag ventricular assist device N/A 12/25/2012    Procedure: REMOVAL OF CENTRIMAG VENTRICULAR ASSIST DEVICE;  Surgeon: Kerin Perna, MD;  Location: [facility] OR;  Service: Open Heart Surgery;  Laterality: N/A;  PUMP STANDBY  . Intraoperative transesophageal echocardiogram N/A 12/25/2012    Procedure: INTRAOPERATIVE TRANSESOPHAGEAL ECHOCARDIOGRAM;  Surgeon: Kerin Perna, MD;  Location: [facility] OR;  Service: Open Heart Surgery;  Laterality: N/A;  . Esophagogastroduodenoscopy N/A 12/26/2012    Procedure: ESOPHAGOGASTRODUODENOSCOPY (EGD);  Surgeon: Beverley Fiedler, MD;  Location: [facility] ENDOSCOPY;  Service: Gastroenterology;  Laterality: N/A;  Bedside  . Sternal closure N/A 12/27/2012    Procedure: STERNAL CLOSURE;  Surgeon: Kerin Perna, MD;  Location: [facility] OR;  Service: Thoracic;  Laterality: N/A;  . 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 EF 15-25% with mild to moderate RV dysfunction.Underwent HM II LVAD implant (under destination criteria) along with TV repair on 8/18. HeartMate 2 implantation with subsequent emergency centri- mag RVAD because of RV dysfunction. Patient had stable hemodynamics on BiVAD support.Returned to OR 8-25 for successful removal of RVAD Responsive but comfortable with IV sedation. Had successful delayed sternal closure 12-29-99   Assessment / Plan / Recommendation Clinical Impression  Pt. s/p prolonged oral  intubation, as well as repair for esophageal tear.  Pt. is alert and cooperative, but very weak, and fatigues easily.  Pt. had an intermittent cough during this clinical swallow evaluation, after liquids and solids.  Difficult to determine if cough is related to swallow, however, O2 sats did decrease to 89% from 96 (baseline prior to po's given).   Rec. objective study of swallowing, either MBS (if ok for pt. to leave unit), or otherwise a FEES.  MD, please order if you agree.    Aspiration Risk  Mild    Diet Recommendation NPO;Ice chips PRN after oral care   Medication Administration: Via alternative means    Other  Recommendations Recommended Consults: FEES;MBS (One or other )   Follow Up Recommendations       Frequency and Duration        Pertinent Vitals/Pain n/a    SLP Swallow Goals   Defer until objective swallow eval completed.  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 EF 15-25% with mild to moderate RV dysfunction.Underwent HM II LVAD implant (under destination criteria) along with TV repair on 8/18. HeartMate 2 implantation with subsequent emergency centri- mag RVAD because of RV dysfunction. Patient had stable hemodynamics on BiVAD support.Returned to OR 8-25 for successful removal of RVAD Responsive but comfortable with IV sedation. Had successful delayed sternal closure 12-29-99 Type of Study: Bedside swallow evaluation Diet Prior to this Study: NPO;Panda Temperature Spikes Noted: No Respiratory Status: Supplemental O2 delivered via (comment) History of Recent Intubation: Yes Length of Intubations (days):  (Prolonged oral intubation.  Ext. 01/02/13) Date extubated: 01/02/13 Behavior/Cognition: Alert;Cooperative;Pleasant mood Oral Cavity - Dentition: Edentulous;Dentures, top;Dentures, bottom Self-Feeding Abilities: Needs assist Patient Positioning: Upright in bed Baseline Vocal Quality: Clear;Low vocal intensity Volitional Cough: Weak Volitional Swallow: Unable to elicit    Oral/Motor/Sensory Function Overall Oral Motor/Sensory Function: Appears within functional limits for tasks assessed   Ice Chips Ice chips: Impaired Presentation: Spoon Pharyngeal Phase Impairments: Throat Clearing -  Immediate   Thin Liquid Thin Liquid: Impaired Presentation: Spoon;Cup;Straw Pharyngeal  Phase Impairments: Cough - Delayed (Intermittent cough)    Nectar Thick Nectar Thick Liquid: Not tested   Honey Thick Honey Thick Liquid: Not tested   Puree Puree: Within functional limits Presentation: Spoon   Solid   GO    Solid: Impaired Presentation: Self Fed Pharyngeal Phase Impairments: Cough - Delayed (Intermittent cough)       Maryjo Rochester T 01/03/2013,4:19 PM

## 2013-01-03 NOTE — Progress Notes (Signed)
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    PT Comments   Patient demonstrates some modest improvements in activity tolerance today. Patient tolerated minimal in room ambulation and extended periods of standing with assist. Patient presents with severe forward flexed cervical posture, worked with patient to increase cervical ROM and educated patient on positioning and activities to improve posture. Ambulation and activity limited by line management. Will continue to work with patient and progress activity as tolerated. Requested LVAD vest to begin further mobility and management of LVAD equipment.   Follow Up Recommendations  CIR           Equipment Recommendations   (ROLLATOR WALKER WITH SEAT)    Recommendations for Other Services OT consult;Rehab consult  Frequency Min 5X/week   Progress towards PT Goals Progress towards PT goals: Progressing toward goals  Plan Current plan remains appropriate    Precautions / Restrictions Precautions Precautions: Sternal;Fall Precaution Comments: LVAD Required Braces or Orthoses:  (LVAD equipment and black bag) Restrictions Weight Bearing Restrictions: No   Pertinent Vitals/Pain Patient reports no pain at this time     Mobility  Bed Mobility Bed Mobility: Rolling Left;Sit to Sidelying Right Rolling Left: 4: Min assist Sit to Sidelying Right: 2: Max assist Details for Bed Mobility Assistance: cueing and assist with pad for precautions and sequence with +2 for line maintenance Transfers Transfers: Sit to Stand;Stand to Sit;Stand Pivot Transfers Sit to Stand: 1: +2 Total assist;From bed Sit to Stand: Patient Percentage: 50% Stand to Sit: 1: +2 Total assist;To chair/3-in-1 Stand to Sit: Patient Percentage: 60% Stand Pivot Transfers: 1: +2 Total assist Stand Pivot Transfers: Patient Percentage: 50% Details for Transfer Assistance: Knee blocking in standing, rocker technique with manual assist to elevate to standing, VCs for upright posture Ambulation/Gait Ambulation/Gait Assistance: 1: +2 Total assist Ambulation/Gait: Patient Percentage: 60% Ambulation Distance (Feet): 8 Feet Assistive device: 2 person hand held assist Ambulation/Gait Assistance Details: bilateral support with assist to control knee buckling, pt with posterior lean, assist for stability VCs for step initiation Gait Pattern: Step-to pattern;Decreased stride length;Shuffle;Trunk flexed;Narrow base of support Gait velocity: decreased    Exercises General Exercises - Lower Extremity Hip Flexion/Marching: AROM;Both;5 reps;Standing (pre gait) Other Exercises Other Exercises: PROM cervical focus towards full cervical extension Other Exercises: Pt encouraged to continue cervical ROM into extenion against resistance from pillow in supine Other Exercises: Pt educated on use of single pillow     PT Goals (current goals can now be found in the care plan section) Acute Rehab PT Goals Patient Stated Goal: get back to work at the [employer] and do the [employer] PT Goal Formulation: With patient Time For Goal Achievement: 01/16/13 Potential to Achieve Goals: Good  Visit Information  Last PT Received On: 01/03/13 Assistance Needed: +2 History  of Present Illness: Kaitlin [name] is a 67 y/o woman with  h/o severe HTN, [name], 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     Subjective Data  Subjective: I hope i'm getting better Patient Stated Goal: get back to work at the [employer] and do the [employer] Arousal/Alertness: Awake/alert Behavior During Therapy: WFL for tasks assessed/performed Overall Cognitive Status: Within Functional Limits for tasks assessed    Balance  Balance Balance Assessed: Yes Static Sitting Balance Static Sitting - Balance Support: No upper extremity supported;Feet supported Static Sitting - Level of Assistance: 4: Min assist Static Sitting - Comment/# of Minutes: VCs and manual assist for cervical posture correction Static Standing Balance Static Standing - Balance Support: Bilateral upper extremity supported;During functional activity Static Standing - Level of Assistance: 2: Max assist Static Standing - Comment/# of Minutes: 10 minutes for hygiene and upright activity  End of Session PT - End of Session Equipment Utilized During Treatment: Gait belt Activity Tolerance: Patient limited by fatigue Patient left: in chair;with call bell/phone within reach;with nursing/sitter in room Nurse Communication: Mobility status (Assisted nsg with hygiene and cleansing s/p liquid stool)   GP     Fabio Asa 01/03/2013, 3:31 PM Charlotte Crumb, PT DPT  [phone]

## 2013-01-03 NOTE — Progress Notes (Signed)
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    Clinical Impression   PTA,  Pt independent with ADL and mobility and worked at [employer]. Pt currently requires total A with ADL and mobility. Pt took 8 steps with +2 total A - pt @ 50%. Easily fatigues.  Feel pt would benefit from CIR to return home @ S [place]. Very supportive family. Pt will benefit from skilled OT services to facilitate D/C to next venue due to below deficits.    OT Assessment  Patient needs continued OT Services    Follow Up Recommendations  CIR    Barriers to Discharge      Equipment Recommendations  3 in 1 bedside comode;Tub/shower bench    Recommendations for Other Services Rehab consult  Frequency  Min 3X/week    Precautions / Restrictions Precautions Precautions: Sternal;Fall Precaution Comments: LVAD Required Braces or Orthoses:  (does not have vest at this time) Restrictions Weight Bearing Restrictions: No   Pertinent Vitals/Pain Vitals stable throughout session. C/o pain during mobility. nsg present during session    ADL  Eating/Feeding: Set up Where Assessed - Eating/Feeding: Chair Grooming: Maximal assistance Where Assessed - Grooming: Unsupported sitting Upper Body Bathing: +1  Total assistance Where Assessed - Upper Body Bathing: Unsupported sitting Lower Body Bathing: +1 Total assistance Where Assessed - Lower Body Bathing: Supported sit to stand Upper Body Dressing: Maximal assistance Where Assessed - Upper Body Dressing: Supported sitting Lower Body Dressing: +1 Total assistance Where Assessed - Lower Body Dressing: Supported sit to Pharmacist, hospital: +2 Total assistance Toilet Transfer: Patient Percentage: 50% Statistician Method: Sit to stand Toileting - Architect and Hygiene: +1 Total assistance Where Assessed - Toileting Clothing Manipulation and Hygiene: Sit to stand from 3-in-1 or toilet Equipment Used: Other (comment) (LVAD equipment) Transfers/Ambulation Related to ADLs: +2 total A . pt @50 %. able to take @8  steps from recliner to chair    OT Diagnosis: Generalized weakness;Acute pain  OT Problem List: Decreased strength;Decreased range of motion;Decreased activity tolerance;Impaired balance (sitting and/or standing);Decreased safety awareness;Decreased knowledge of use of DME or AE;Decreased knowledge of precautions;Cardiopulmonary status limiting activity;Obesity;Impaired UE functional use;Pain;Increased edema OT Treatment Interventions: Self-care/ADL training;Therapeutic exercise;Energy conservation;DME and/or AE instruction;Therapeutic activities;Patient/family education   OT Goals(Current goals can be found in the care plan section) Acute Rehab OT Goals Patient Stated Goal: get back to work at the [employer] and do the wobble OT Goal Formulation: With patient Time For Goal Achievement: 01/17/13 Potential to Achieve Goals: Good  Visit Information  Last OT Received On: 01/03/13 Assistance Needed: +2 History of Present Illness: Kaitlin [name] is a 67 y/o woman with h/o severe HTN, [name], VT s/p Biotronik ICD (2009) and 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 EF 15-25% with mild to moderate RV dysfunction.Underwent HM II LVAD implant (under destination  criteria) along with TV repair on 8/18. HeartMate  2 implantation with subsequent emergency centri- mag RVAD because of RV dysfunction. Patient had stable hemodynamics on BiVAD support.Returned to OR 8-25 for successful removal of RVAD Responsive but comfortable with IV sedation. Had successful delayed sternal closure 8-28-014        Prior Functioning     Home Living Family/patient expects to be discharged to:: Private residence Living Arrangements: Alone Available Help at Discharge: Family;Available 24 hours/day Type of Home: House Home Access: Stairs to enter [employer] of Steps: 5 Entrance Stairs-Rails: Can reach both Home Layout: One level Home Equipment: None Prior Function Level of Independence: Independent Communication Communication: No difficulties Dominant Hand: Right         Vision/Perception     Cognition  Cognition Arousal/Alertness: Awake/alert Behavior During Therapy: WFL for tasks assessed/performed Overall Cognitive Status: No family/caregiver present to determine baseline cognitive functioning Memory:  (slow to respond)    Extremity/Trunk Assessment Upper Extremity Assessment Upper Extremity Assessment: Generalized weakness Lower Extremity Assessment Lower Extremity Assessment: Generalized weakness Cervical / Trunk Assessment Cervical / Trunk Assessment: Other exceptions (forward head) Cervical / Trunk Exceptions: forward head     Mobility Bed Mobility Bed Mobility: Rolling Left;Sit to Sidelying Right Rolling Left: 4: Min assist Sit to Sidelying Right: 1: +2 Total assist;HOB flat (max vc for sternal precautions) Details for Bed Mobility Assistance: +2 total A for scoot to HOB Transfers Sit to Stand: 1: +2 Total assist;From bed Sit to Stand: Patient Percentage: 50% Stand to Sit: 1: +2 Total assist;To chair/3-in-1 Stand to Sit: Patient Percentage: 50% Details for Transfer Assistance: used momentum to stand without BUE.       Exercise General Exercises - Lower Extremity Hip Flexion/Marching: AROM;Both;5 reps;Standing (pre gait) Other Exercises Other Exercises: PROM cervical focus towards full cervical extension Other Exercises: Pt encouraged to continue cervical ROM into extenion against resistance from pillow in supine Other Exercises: Pt educated on use of single pillow   Balance Balance Balance Assessed: Yes Static Sitting Balance Static Sitting - Balance Support: No upper extremity supported;Feet supported Static Sitting - Level of Assistance: 5: Stand by assistance Static Sitting - Comment/# of Minutes: 5 Static Standing Balance Static Standing - Balance Support: Bilateral upper extremity supported;During functional activity Static Standing - Level of Assistance: 2: Max assist Static Standing - Comment/# of Minutes: 10 minutes   End of Session OT - End of Session Activity Tolerance: Patient limited by fatigue Patient left: in bed;with call bell/phone within reach;with nursing/sitter in room Nurse Communication: Mobility status;Precautions  GO     [facility] 01/03/2013, 4:39 PM [facility], OTR/L  [phone] 01/03/2013

## 2013-01-04 ENCOUNTER — Inpatient Hospital Stay (HOSPITAL_COMMUNITY): Payer: Commercial Managed Care - PPO

## 2013-01-04 DIAGNOSIS — I428 Other cardiomyopathies: Secondary | ICD-10-CM

## 2013-01-04 DIAGNOSIS — Z95818 Presence of other cardiac implants and grafts: Secondary | ICD-10-CM

## 2013-01-04 DIAGNOSIS — I5022 Chronic systolic (congestive) heart failure: Secondary | ICD-10-CM

## 2013-01-04 LAB — CBC
HCT: 27.5 % — ABNORMAL LOW (ref 36.0–46.0)
Hemoglobin: 8.6 g/dL — ABNORMAL LOW (ref 12.0–15.0)
MCH: 29.4 pg (ref 26.0–34.0)
MCHC: 31.3 g/dL (ref 30.0–36.0)
MCV: 93.9 fL (ref 78.0–100.0)
Platelets: 611 10*3/uL — ABNORMAL HIGH (ref 150–400)
RBC: 2.93 MIL/uL — ABNORMAL LOW (ref 3.87–5.11)
RDW: 17.2 % — ABNORMAL HIGH (ref 11.5–15.5)
WBC: 18.9 10*3/uL — ABNORMAL HIGH (ref 4.0–10.5)

## 2013-01-04 LAB — COMPREHENSIVE METABOLIC PANEL
ALT: 25 U/L (ref 0–35)
AST: 32 U/L (ref 0–37)
Albumin: 2.7 g/dL — ABNORMAL LOW (ref 3.5–5.2)
Alkaline Phosphatase: 143 U/L — ABNORMAL HIGH (ref 39–117)
BUN: 46 mg/dL — ABNORMAL HIGH (ref 6–23)
CO2: 37 mEq/L — ABNORMAL HIGH (ref 19–32)
Calcium: 9.5 mg/dL (ref 8.4–10.5)
Chloride: 101 mEq/L (ref 96–112)
Creatinine, Ser: 0.98 mg/dL (ref 0.50–1.10)
GFR calc Af Amer: 69 mL/min — ABNORMAL LOW (ref >90)
GFR calc non Af Amer: 59 mL/min — ABNORMAL LOW (ref >90)
Glucose, Bld: 165 mg/dL — ABNORMAL HIGH (ref 70–99)
Potassium: 3.1 mEq/L — ABNORMAL LOW (ref 3.5–5.1)
Sodium: 146 mEq/L — ABNORMAL HIGH (ref 135–145)
Total Bilirubin: 0.9 mg/dL (ref 0.3–1.2)
Total Protein: 5.7 g/dL — ABNORMAL LOW (ref 6.0–8.3)

## 2013-01-04 LAB — GLUCOSE, CAPILLARY
Glucose-Capillary: 137 mg/dL — ABNORMAL HIGH (ref 70–99)
Glucose-Capillary: 90 mg/dL (ref 70–99)

## 2013-01-04 LAB — BASIC METABOLIC PANEL
BUN: 46 mg/dL — ABNORMAL HIGH (ref 6–23)
CO2: 33 mEq/L — ABNORMAL HIGH (ref 19–32)
Calcium: 9.5 mg/dL (ref 8.4–10.5)
Chloride: 100 mEq/L (ref 96–112)
Creatinine, Ser: 1 mg/dL (ref 0.50–1.10)
GFR calc Af Amer: 67 mL/min — ABNORMAL LOW (ref >90)
GFR calc non Af Amer: 58 mL/min — ABNORMAL LOW (ref >90)
Glucose, Bld: 187 mg/dL — ABNORMAL HIGH (ref 70–99)
Potassium: 2.8 mEq/L — ABNORMAL LOW (ref 3.5–5.1)
Sodium: 143 mEq/L (ref 135–145)

## 2013-01-04 LAB — PROTIME-INR
INR: 2.05 — ABNORMAL HIGH (ref 0.00–1.49)
Prothrombin Time: 22.5 seconds — ABNORMAL HIGH (ref 11.6–15.2)

## 2013-01-04 LAB — LACTATE DEHYDROGENASE: LDH: 584 U/L — ABNORMAL HIGH (ref 94–250)

## 2013-01-04 LAB — CARBOXYHEMOGLOBIN
Carboxyhemoglobin: 1.5 % (ref 0.5–1.5)
Methemoglobin: 0.8 % (ref 0.0–1.5)
O2 Saturation: 68.2 %
Total hemoglobin: 8.3 g/dL — ABNORMAL LOW (ref 12.0–16.0)

## 2013-01-04 LAB — VANCOMYCIN, TROUGH: Vancomycin Tr: 22.4 ug/mL — ABNORMAL HIGH (ref 10.0–20.0)

## 2013-01-04 LAB — CHROMOGENIC FACTOR X (DUKE LAB)

## 2013-01-04 MED ORDER — VANCOMYCIN HCL 10 G IV SOLR
1250.0000 mg | INTRAVENOUS | Status: DC
  Filled 2013-01-04: qty 1250

## 2013-01-04 MED ORDER — PRO-STAT SUGAR FREE PO LIQD
60.0000 mL | Freq: Two times a day (BID) | ORAL | Status: DC
  Administered 2013-01-04 (×2): 60 mL
  Filled 2013-01-04 (×4): qty 60

## 2013-01-04 MED ORDER — VANCOMYCIN HCL 10 G IV SOLR
1250.0000 mg | INTRAVENOUS | Status: DC
  Administered 2013-01-04 – 2013-01-08 (×4): 1250 mg via INTRAVENOUS
  Filled 2013-01-04 (×5): qty 1250

## 2013-01-04 MED ORDER — POTASSIUM CHLORIDE 10 MEQ/50ML IV SOLN
10.0000 meq | INTRAVENOUS | Status: AC
  Administered 2013-01-04 (×2): 10 meq via INTRAVENOUS

## 2013-01-04 MED ORDER — NOREPINEPHRINE BITARTRATE 1 MG/ML IJ SOLN
5.0000 ug/min | INTRAVENOUS | Status: DC
  Filled 2013-01-04: qty 4

## 2013-01-04 MED ORDER — DOPAMINE-DEXTROSE 3.2-5 MG/ML-% IV SOLN: 2.5000 ug/kg/min | INTRAVENOUS | Status: DC

## 2013-01-04 MED ORDER — POTASSIUM CHLORIDE 10 MEQ/50ML IV SOLN
10.0000 meq | INTRAVENOUS | Status: AC
  Administered 2013-01-04 (×4): 10 meq via INTRAVENOUS

## 2013-01-04 MED ORDER — POTASSIUM CHLORIDE 10 MEQ/50ML IV SOLN
10.0000 meq | INTRAVENOUS | Status: DC
  Administered 2013-01-04: 10 meq via INTRAVENOUS

## 2013-01-04 MED ORDER — POTASSIUM CHLORIDE 10 MEQ/50ML IV SOLN
10.0000 meq | INTRAVENOUS | Status: DC | PRN
  Administered 2013-01-04 (×2): 10 meq via INTRAVENOUS
  Filled 2013-01-04 (×4): qty 150
  Filled 2013-01-04 (×2): qty 50
  Filled 2013-01-04: qty 200
  Filled 2013-01-04 (×2): qty 100

## 2013-01-04 MED ORDER — POTASSIUM CHLORIDE 10 MEQ/50ML IV SOLN
INTRAVENOUS | Status: AC
  Administered 2013-01-04: 10 meq
  Filled 2013-01-04: qty 150

## 2013-01-04 MED ORDER — WARFARIN SODIUM 6 MG PO TABS
6.0000 mg | ORAL_TABLET | Freq: Once | ORAL | Status: AC
  Administered 2013-01-04: 6 mg via ORAL
  Filled 2013-01-04: qty 1

## 2013-01-04 NOTE — Clinical Documentation Improvement (Signed)
THIS DOCUMENT IS NOT A PERMANENT PART OF THE MEDICAL RECORD  Please update your documentation with the medical record to reflect your response to this query. If you need help knowing how to do this please call [phone].  01/04/13  Dear Dr.Van Maudie Flakes / Associates  In a better effort to capture your patient's severity of illness, reflect appropriate length of stay and utilization of resources, a review of the patient medical record has revealed the following indicators.    Based on your clinical judgment, please clarify and document in a progress note and/or discharge summary the clinical condition associated with the following supporting information:    Possible Clinical Conditions?     Aspiration Pneumonia (POA?)   Gram Negative Pneumonia (POA?)  Bacterial pneumonia, specify type if known (POA?)    Klebsiella PNA    E Coli PNA    Pseudomonas PNA    Candidiasis PNA    Staph/MRSA PNA    Strep PNA    Pneumococcal PNA    H Influenza PNA    H Para Influenza PNA     Viral PNA (POA?)     Pneumonia (CAP, HAP) (POA?)    Other Condition    Cannot Clinically Determine      Risk Factors:  Vancomycin day number 18 plus Primaxin day number 8 for Pneumonia, noted per 9/04 progress notes.    You may use possible, probable, or suspect with inpatient documentation. possible, probable, suspected diagnoses MUST be documented at the time of discharge  Reviewed: Pneumonia has been documented but no further specificity has been documented in the medical record.  Thank You,  Marciano Sequin,  Clinical Documentation Specialist: [phone] Health Information Management [place]

## 2013-01-04 NOTE — Progress Notes (Signed)
Pt walked with Nitric/PT/RT and tolerated well. Pt back in room and resting comfortably. RT Will continue to monitor.

## 2013-01-04 NOTE — Progress Notes (Signed)
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    OT comments  Pt making good progress today with increased ambulation - @ 40 ft using w/c and mod a for support. +2 for safety. Respiratory assisted.  Began education on switching power systems. Pt observing while therapist explaining process. Pt participated in self test today.  Discussed rehab process with pt and encouraged positive outlook and progress pt has made. Pt with apparent depressed mood. Flat affect. Pt states she "wants some food and water". Will continue to follow.  Follow Up Recommendations  CIR    Barriers to Discharge       Equipment Recommendations  3 in 1 bedside comode;Tub/shower bench    Recommendations for Other Services Rehab consult  Frequency Min 3X/week   Progress towards OT Goals Progress towards OT goals: Progressing toward goals  Plan Discharge plan remains appropriate    Precautions / Restrictions Precautions Precautions: Sternal;Fall Precaution Comments: LVAD Required Braces or Orthoses:  (LVAD equipment and black bag) Restrictions Weight Bearing Restrictions: No   Pertinent Vitals/Pain stable    ADL  ADL Comments: Began education regarding LVAD equipment and switching from module to  battery system. Reviewed sternal precautions.    OT Diagnosis:    OT Problem List:   OT Treatment Interventions:     OT Goals(current goals can now be found in the care plan section) Acute Rehab OT Goals Patient Stated Goal: get back to work at the [employer] and do the wobble OT Goal Formulation: With patient Time For Goal Achievement: 01/17/13 Potential to Achieve Goals: Good ADL Goals Pt Will Perform Grooming: with supervision;with set-up;sitting Pt Will Perform Upper Body Bathing: with set-up;with supervision;sitting Pt Will Transfer to Toilet: with supervision;ambulating;bedside commode Pt Will Perform Toileting - Clothing Manipulation and hygiene: with supervision;sit to/from stand Additional ADL Goal #1: Pt will connect/disconnect to/from battery system with family assisting Additional ADL Goal #2: Pt will use AE PRN to complete LB ADL with S of caregivers.  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     Subjective Data      Prior Functioning       Cognition  Cognition Arousal/Alertness: Awake/alert Behavior During Therapy: Flat affect Overall Cognitive Status: Within Functional Limits for tasks assessed (no family available )    Mobility  Bed  Mobility Bed Mobility: Rolling Right;Right Sidelying to Sit Rolling Right: 3: Min assist  Right Sidelying to Sit: 3:  Mod assist;HOB elevated  Details for Bed Mobility Assistance: vc for sternal precautions. Pt with improved bed mobility today Transfers Transfers: Sit to Stand;Stand to Sit Sit to Stand: 1: +2 Total assist;Without upper extremity assist;From bed Sit to Stand: Patient Percentage: 70% Stand to Sit: 1: +2 Total assist;To chair/3-in-1 Stand to Sit: Patient Percentage: 70% Details for Transfer Assistance: improved stability and strength today    Exercises  Other Exercises Other Exercises: general BUE AROM within tolerance Other Exercises: Pt encouraged to continue cervical ROM into extension against resistance from pillow in supine Other Exercises: Pt educated on use of single pillow   Balance Balance Balance Assessed: Yes Static Sitting Balance Static Sitting - Balance Support: No upper extremity supported;Feet supported Static Sitting - Level of Assistance: 4:SBA Static Sitting - Comment/# of Minutes: 10 min SBA. Rested back on therapist while waiting for erspiratory Static Standing Balance Static Standing - Balance Support: Bilateral upper extremity supported;During functional activity Static Standing - Level of Assistance: min A Static Standing - Comment/# of Minutes: 8 minutes for hygiene purposes and static rest break   End of Session OT - End of Session Equipment Utilized During Treatment: Other (comment);Oxygen (w/c. respiratory assisted due to nitric ox) Activity Tolerance: Patient limited by fatigue Patient left: in chair;with call bell/phone within reach;with nursing/sitter in room Nurse Communication: Mobility status;Precautions  GO     Suzy Kugel,HILLARY 01/04/2013, 10:03 AM Luisa Dago, OTR/L  [phone] 01/04/2013

## 2013-01-04 NOTE — Progress Notes (Addendum)
Pharmacy Consult Note  Pharmacy Consult for Vancomycin and Imipenem Indication: PNA  No Known Allergies  Patient Measurements: Height: 5\' 9"  (175.3 cm) Weight: 181 lb (82.1 kg) IBW/kg (Calculated) : 66.2  Vital Signs: Temp: 98.3 F (36.8 C) (09/04 0435) Temp src: Oral (09/04 0435) Pulse Rate: 94 (09/04 0400) Intake/Output from previous day: 09/03 0701 - 09/04 0700 In: 2575.3 [I.V.:745.3; NG/GT:1030; IV Piggyback:800] Out: 3245 [Urine:3245] Intake/Output from this shift: Total I/O In: 1098.4 [I.V.:248.4; NG/GT:450; IV Piggyback:400] Out: 1445 [Urine:1445]  Labs:  Recent Labs  01/02/13 0400 01/02/13 1600 01/03/13 0335 01/03/13 2004 01/04/13 0420  WBC 21.9*  --  23.2*  --  18.9*  HGB 8.6*  --  8.7* 9.5* 8.6*  PLT 595*  --  621*  --  611*  CREATININE 0.92 0.89 0.98  --   --    Estimated Creatinine Clearance: 65.6 ml/min (by C-G formula based on Cr of 0.98).  Recent Labs  01/04/13 0420  VANCOTROUGH 22.4*    Assessment: Vancomycin (Day #18) + primaxin (Day #8) for PNA.  WBC elevated but trending down.  SCr stable.  Afebrile.  Vancomycin trough 22.1 mcg/ml on vancomycin 1500 mg IV q24h. Trough drawn a couple of hours early. UOP 2.57ml/kg/hr.  8/19 TA, rare GPC -final 8/23 urine-ng 8/23 TA - ng  8/28 Blood - ngtd  Goal of Therapy:  Vancomycin trough level 15-20 mcg/ml  Plan:  - Decrease vancomycin to 1250 mg IV q24h. - Continue imipenem 500mg  IV q8h. - Follow up SCr, UOP, cultures, clinical course and LOT  Christoper Fabian, PharmD, BCPS Clinical pharmacist, pager [phone] 01/04/2013 5:08 AM    patient examined and medical record reviewed,agree with above note. [name] III,Omarie Parcell 01/04/2013

## 2013-01-04 NOTE — Procedures (Signed)
Objective Swallowing Evaluation: Fiberoptic Endoscopic Evaluation of Swallowing  Patient Details  Name: Kaitlin [name] MRN: [id number] Date of Birth: 1945/10/15  Today's Date: 01/04/2013 Time: 1430-1500 SLP Time Calculation (min): 30 min  Past Medical History:  Past Medical History  Diagnosis Date  . Paroxysmal supraventricular tachycardia   . Hypokalemia   . Chronic systolic heart failure     a. 08/979 Echo: EF 15%, mod to sev antlat and inflat HK, inf AK, mild to mod MR, mildly/mod reduced RV fxn, Mod TR, PASP .  [name] History of DVT (deep vein thrombosis)   . Dyslipidemia   . HTN (hypertension)   . Nonischemic cardiomyopathy     a. 06/2012 Echo: EF 15%;  b. 06/2012 Cath: nl except luminal irregs in LAD.  [name] Ventricular tachycardia     a. s/p ICD  . Goiter 2001  . Implantable cardiac defibrillator- Biotronik 2009    Single chamber  . [name] (left bundle branch block)     intermittent  . Palliative care patient    Past Surgical History:  Past Surgical History  Procedure Laterality Date  . None    . Cardiac defibrillator placement  01/2009  . Insertion of implantable left ventricular assist device N/A 12/18/2012    Procedure: INSERTION OF IMPLANTABLE LEFT VENTRICULAR ASSIST DEVICE;  Surgeon: Kerin Perna, MD;  Location[facility] OR;  Service: Open Heart Surgery;  Laterality: N/A;  . Intraoperative transesophageal echocardiogram N/A 12/18/2012    Procedure: INTRAOPERATIVE TRANSESOPHAGEAL ECHOCARDIOGRAM;  Surgeon: Kerin Perna, MD;  Location: [facility] OR;  Service: Open Heart Surgery;  Laterality: N/A;  . Tricuspid valve replacement N/A 12/18/2012    Procedure: TRICUSPID VALVE REPAIR;  Surgeon: Kerin Perna, MD;  Location: [facility] OR;  Service: Open Heart Surgery;  Laterality: N/A;  . Insertion of implantable left ventricular assist device N/A 12/18/2012    Procedure: INSERTION OF IMPLANTABLE RIGHT VENTRICULAR ASSIST DEVICE;  Surgeon: Kerin Perna, MD;  Location: [facility] OR;  Service: Open  Heart Surgery;  Laterality: N/A;  . Removal of centrimag ventricular assist device N/A 12/25/2012    Procedure: REMOVAL OF CENTRIMAG VENTRICULAR ASSIST DEVICE;  Surgeon: Kerin Perna, MD;  Location: [facility] OR;  Service: Open Heart Surgery;  Laterality: N/A;  PUMP STANDBY  . Intraoperative transesophageal echocardiogram N/A 12/25/2012    Procedure: INTRAOPERATIVE TRANSESOPHAGEAL ECHOCARDIOGRAM;  Surgeon: Kerin Perna, MD;  Location: [facility] OR;  Service: Open Heart Surgery;  Laterality: N/A;  . Esophagogastroduodenoscopy N/A 12/26/2012    Procedure: ESOPHAGOGASTRODUODENOSCOPY (EGD);  Surgeon: Beverley Fiedler, MD;  Location: [facility] ENDOSCOPY;  Service: Gastroenterology;  Laterality: N/A;  Bedside  . Sternal closure N/A 12/27/2012    Procedure: STERNAL CLOSURE;  Surgeon: Kerin Perna, MD;  Location[facility] OR;  Service: Thoracic;  Laterality: N/A;  . 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 EF 15-25% with mild to moderate RV dysfunction.Underwent HM II LVAD implant (under destination criteria) along with TV repair on 8/18. HeartMate 2 implantation with subsequent emergency centri- mag RVAD because of RV dysfunction. Patient had stable hemodynamics on BiVAD support.Returned to OR 8-25 for successful removal of RVAD Responsive but comfortable with IV sedation. Had successful delayed sternal closure 12-29-99     Assessment / Plan /  Recommendation Clinical Impression  Dysphagia Diagnosis: Mild pharyngeal phase dysphagia Clinical impression: Patient presents with a mild pharyngeal phase dysphagia with sensory component likely due to prolonged intubation. Delayed swallow initiation noted however patient able to fully protect airway through out exam across consistencies. No aspiration or  penetration observed. Recommend initiation of a regular diet, thin liquids with general safe swallowing precautions. Given general deconditioning increasing risk of aspiration, will f/u briefly to ensure tolerance.     Treatment Recommendation  Therapy as outlined in treatment plan below    Diet Recommendation Regular;Thin liquid   Liquid Administration via: Cup;Straw Medication Administration: Whole meds with liquid Supervision: Patient able to self feed (provide feeding assist as needed) Compensations: Slow rate;Small sips/bites Postural Changes and/or Swallow Maneuvers: Seated upright 90 degrees    Other  Recommendations Oral Care Recommendations: Oral care BID   Follow Up Recommendations  None    Frequency and Duration min 2x/week  1 week   Pertinent Vitals/Pain None reported    SLP Swallow Goals Patient will utilize recommended strategies during swallow to increase swallowing safety with: Independent assistance Swallow Study Goal #2 - Progress: Not met   General HPI: Kaitlin [name] is a 67 y/o woman with h/o severe HTN, [name], VT s/p Biotronik ICD (2009) and 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 EF 15-25% with mild to moderate RV dysfunction.Underwent HM II LVAD implant (under destination criteria) along with TV repair on 8/18. HeartMate 2 implantation with subsequent emergency centri- mag RVAD because of RV dysfunction. Patient had stable hemodynamics on BiVAD support.Returned to OR 8-25 for successful removal of RVAD Responsive but comfortable with IV sedation. Had successful delayed sternal closure 12-29-99 Type of Study: Fiberoptic Endoscopic Evaluation of Swallowing Reason for Referral: Objectively evaluate swallowing function Previous Swallow Assessment: Bedside swallow evaluation indicated need for objective eval to r/o aspiration Diet Prior to this Study: NPO;Panda Temperature Spikes Noted: No Respiratory Status: Supplemental O2 delivered via (comment) (nasal cannula) History of Recent  Intubation: Yes Date extubated: 01/02/13 Behavior/Cognition: Alert;Cooperative;Pleasant mood Oral Cavity - Dentition: Dentures, top;Dentures, bottom Self-Feeding Abilities: Able to feed self;Needs assist (due to weakness) Patient Positioning: Upright in bed Baseline Vocal Quality: Clear;Low vocal intensity Volitional Cough: Weak Volitional Swallow: Able to elicit Anatomy: Within functional limits Pharyngeal Secretions: Normal    Reason for Referral Objectively evaluate swallowing function   Oral Phase Oral Preparation/Oral Phase Oral Phase: WFL   Pharyngeal Phase Pharyngeal Phase Pharyngeal Phase: Impaired Pharyngeal - Thin Pharyngeal - Ice Chips: Delayed swallow initiation;Premature spillage to pyriform sinuses Pharyngeal - Thin Teaspoon: Delayed swallow initiation;Premature spillage to pyriform sinuses Pharyngeal - Thin Straw: Delayed swallow initiation;Premature spillage to valleculae;Pharyngeal residue - pyriform sinuses (trace residue clears with spontaneous dry swallow) Pharyngeal - Solids Pharyngeal - Puree: Delayed swallow initiation;Premature spillage to valleculae Pharyngeal - Mechanical Soft: Delayed swallow initiation;Premature spillage to valleculae  Cervical Esophageal Phase    GO    Cervical Esophageal Phase Cervical Esophageal Phase: [facility]        Ferdinand Lango MA, CCC-SLP [phone]  Cariana Karge Meryl 01/04/2013, 3:16 PM

## 2013-01-04 NOTE — Progress Notes (Signed)
. HeartMate 2 Rounding Note  Subjective:   Postop day #17   HeartMate 2 implantation with subsequent emergency centri- mag RVAD because of  RV dysfunction. Patient  had  stable hemodynamics on BiVAD support.Returned to OR 8-25 for successful removal of RVAD   Responsive but comfortable with IV sedation.  Had successful delayed sternal closure  8-28-014  Patient continues to have stable hemodynamics after chest closure Postop 2D echo 9-2 shows sig improvement of RV fx  Patient was successfully extubated postop day #13. She has since been out of bed to chair.Today she walked 50 ft.   Low-dose nitric oxide 10 ppm has been added to her nasal cannula oxygen circuit.Weaniong off NO today from nasal cannula  A postop, postextubation 2-D echocardiogram shows significant improvement in RV function and decrease in tricuspid regurgitation  Patient's mental status and neuro status is intact. She remains generally weak but is now receiving physical therapy.  Plan speech therapy evaluation for swallowing- passed FEES eval today so will start D-III diet   UGI bleed from M-W tear- now inactive, on protonix  On coumadin , ASA with goal INR 2.0-2.5 LDH sl increased today  Feeding tube post-pyloric and Vital 1.5 started--rectal tube placed for diarrhea tube feeds at goal   diuresis for fluid overload- lasix dripoff and on po demedex  WBC >30k> 25>20>18 with procalcitonin 1.3>1.0 on Vanc, Primaxin and short course of IV Diflucan. Cultures neg  Surgical incisions healing- daily VAD drive line dressing changed daily  LVAD INTERROGATION:  HeartMate II LVAD:  Flow 3.5 liters/min, speed 8400 power 3.2, PI 4.7.   Objective:    Vital Signs:   Temp:  [97.4 F (36.3 C)-98.3 F (36.8 C)] 98.2 F (36.8 C) (09/04 1141) Pulse Rate:  [76-109] 93 (09/04 1400) Resp:  [17-29] 20 (09/04 1400) SpO2:  [89 %-100 %] 96 % (09/04 1400) Weight:  [182 lb 5.1 oz (82.7 kg)] 182 lb 5.1 oz (82.7 kg) (09/04 0600) Last  BM Date: 12/31/12 Mean arterial Pressure 84  Intake/Output:   Intake/Output Summary (Last 24 hours) at 01/04/13 1517 Last data filed at 01/04/13 1416  Gross per 24 hour  Intake 3024.8 ml  Output   3080 ml  Net  -55.2 ml     Physical Exam: General: extubated, responsive, comfortable HEENT: normal Neck: supple. JVP normal  No lymphadenopathy or thryomegaly appreciated. Cor: Mechanical heart sounds with LVAD hum present. Lungs: clear Abdomen: soft, nontender, nondistended. No hepatosplenomegaly. No bruits or masses. Good bowel sounds. Extremities: no cyanosis, clubbing, rash,  Sig edema remains  extremities warm Neuro: moves all 4 extremities  Telemetry: sinus 104/min  Labs: Basic Metabolic Panel:  Recent Labs Lab 01/01/13 1600 01/02/13 0400 01/02/13 1600 01/03/13 0335 01/03/13 2004 01/04/13 0420  NA 139 140 142 147* 142 146*  K 3.1* 3.1* 2.9* 3.0* 2.5* 3.1*  CL 96 94* 97 100  --  101  CO2 33* 37* 35* 38*  --  37*  GLUCOSE 173* 185* 185* 148* 141* 165*  BUN 34* 35* 37* 44*  --  46*  CREATININE 0.90 0.92 0.89 0.98  --  0.98  CALCIUM 9.2 9.6 9.5 9.9  --  9.5    Liver Function Tests:  Recent Labs Lab 12/31/12 0400 01/01/13 0420 01/02/13 0400 01/03/13 0335 01/04/13 0420  AST 41* 39* 29 33 32  ALT 35 35 27 27 25   ALKPHOS 186* 202* 164* 154* 143*  BILITOT 1.2 1.2 1.0 1.0 0.9  PROT 5.4* 6.0 6.0 5.9* 5.7*  ALBUMIN 2.2* 2.6* 2.6* 2.7* 2.7*   No results found for this basename: LIPASE, AMYLASE,  in the last 168 hours No results found for this basename: AMMONIA,  in the last 168 hours  CBC:  Recent Labs Lab 12/31/12 0400 01/01/13 0420 01/02/13 0400 01/03/13 0335 01/03/13 2004 01/04/13 0420  WBC 19.4* 23.8* 21.9* 23.2*  --  18.9*  HGB 9.3* 9.3* 8.6* 8.7* 9.5* 8.6*  HCT 27.7* 28.4* 27.4* 27.5* 28.0* 27.5*  MCV 86.6 89.0 91.0 92.0  --  93.9  PLT 498* 594* 595* 621*  --  611*    INR:  Recent Labs Lab 01/02/13 0400 01/02/13 1120 01/03/13 0335  01/03/13 1000 01/04/13 0420  INR 2.57* 1.93* 2.91* 2.15* 2.05*    Other results:  EKG:   Imaging: Dg Chest Port 1 View  01/04/2013   *RADIOLOGY REPORT*  Clinical Data: Status post insertion of ventricular assist device.  PORTABLE CHEST - 1 VIEW  Comparison: 01/03/2013  Findings: No pneumothorax or significant pleural fluid identified. Aeration of both lower lobes has improved.  Atelectasis remains bilaterally.  The heart size and mediastinal contours are stable. Positioning of central line and ventricular assist device are stable.  IMPRESSION: Improved aeration bilaterally.   Original Report Authenticated By: Irish Lack, M.D.   Dg Chest Port 1 View  01/03/2013   *RADIOLOGY REPORT*  Clinical Data: Left ventricular assist device.  PORTABLE CHEST - 1 VIEW  Comparison: 01/02/2013  Findings: Again noted is a left ventricular assist device.  There is a left cardiac ICD.  A feeding tube extends into the abdomen. Right central line tip appears to extend to the right atrium and unchanged.  There are stable patchy densities in the right lung with more confluent disease in the mid aspect.  These findings are unchanged.  Slightly improved aeration in the left lung.  Heart size appears to be upper limits of normal but unchanged.  Haziness at the right lung base may represent pleural fluid.  The left jugular central line has been removed.  IMPRESSION: Persistent patchy disease in the right lung and suspect right pleural fluid.  Slightly improved aeration in the left lung.  Support apparatuses as described.   Original Report Authenticated By: Richarda Overlie, M.D.     Medications:     Scheduled Medications: . amiodarone  200 mg Per NG tube BID  . antiseptic oral rinse  15 mL Mouth Rinse QID  . aspirin  324 mg Per Tube Daily  . aspirin  300 mg Rectal Daily  . chlorhexidine  15 mL Mouth Rinse BID  . citalopram  20 mg Oral Daily  . digoxin  0.125 mg Oral Daily  . feeding supplement  60 mL Per Tube BID  .  feeding supplement (VITAL 1.5 CAL)  40 mL/hr Per Tube Q24H  . fluconazole (DIFLUCAN) IV  400 mg Intravenous Q24H  . imipenem-cilastatin  500 mg Intravenous Q8H  . insulin aspart  0-24 Units Subcutaneous Q4H  . insulin glargine  18 Units Subcutaneous BID  . levalbuterol  1.25 mg Nebulization Q6H  . magic mouthwash  5 mL Oral QID  . pantoprazole sodium  40 mg Per Tube BID  . sildenafil  40 mg Per Tube TID  . sodium chloride  10 mL Intravenous Q12H  . torsemide  20 mg Oral BID  . vancomycin  1,250 mg Intravenous Q24H  . warfarin  6 mg Oral ONCE-1800  . Warfarin - Physician Dosing Inpatient   Does not apply [phone]  Infusions: . sodium chloride 20 mL/hr at 01/01/13 0400  . sodium chloride 20 mL/hr at 01/02/13 0150  . sodium chloride 20 mL/hr (01/04/13 1419)  . sodium chloride 30 mL/hr at 01/01/13 0400  . DOBUTamine 3 mcg/kg/min (01/04/13 0800)  . DOPamine    . lactated ringers Stopped (12/28/12 0000)  . 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 Goal INR 2.5 Support native RV fx-low-dose, dobutamine,  revatio - weaning nitric and dopamine  Nutrition w/ TF started now  at goal per nutrition- start po diet c/w FEES eval  Sternal incision inspected and is intact and dry  Transition Lasix drip to oral Demadex  PT input, OOB to chair and ambulating  I reviewed the LVAD  parameters from today, and compared the results to the patient's prior recorded data.  No programming changes were made.  The LVAD is functioning within specified parameters.  The patient performs LVAD self-test daily.  LVAD interrogation was negative for any significant power changes, alarms or PI events/speed drops.  LVAD equipment check completed and is in good working order.  Back-up equipment present.   LVAD education done on emergency procedures and precautions and reviewed exit site care.  Length of Stay: 27  [name] III,Kaitlin [name] 01/04/2013, 3:17 PM

## 2013-01-04 NOTE — Progress Notes (Signed)
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    PT Comments   Patient demonstrates steady progress in mobility today.  Patient with some initial anxiety for mobility responded well with jazz music for calming effect during session.  Patient tolerated out of room ambulation today 50 ft. On Nitric, With assist from respiratory, nsg, OT and PT. (probably could have gone further but limited by incontinence of liquid stool; anticipate that this will improve once diet advanced).  Initial education with teachback performed for conversion to battery source and LVAD equipment.  Will continue to progress education as able. Patient did perform self test and battery test with verbal cues today.  Overall, feel Kaitlin [name] is making steady progress towards PT goals, will continue to see as indicated and progress activity as tolerated.   Follow Up Recommendations  CIR     Does the patient have the potential to tolerate intense rehabilitation   YES     Equipment Recommendations   (ROLLATOR WALKER WITH SEAT)    Recommendations for Other Services OT consult;Rehab consult  Frequency Min 5X/week   Progress towards PT Goals Progress towards PT goals: Progressing toward goals  Plan Current plan  remains appropriate    Precautions / Restrictions Precautions Precautions: Sternal;Fall Precaution Comments: LVAD Required Braces or Orthoses:  (LVAD equipment and black bag) Restrictions Weight Bearing Restrictions: No   Pertinent Vitals/Pain Pt reports no pain, vitals monitored, VSS    Mobility  Bed Mobility Bed Mobility: Rolling Left;Sit to Sidelying Right Rolling Left: 4: Min assist Sit to Sidelying Right: 2: Max assist Details for Bed Mobility Assistance: cueing and assist with pad for precautions and sequence with +2 for line maintenance Transfers Transfers: Sit to Stand;Stand to Sit Sit to Stand: 1: +2 Total assist;From bed Sit to Stand: Patient Percentage: 60% Stand to Sit: 1: +2 Total assist;To chair/3-in-1 Stand to Sit: Patient Percentage: 70% Details for Transfer Assistance: Better stability and control of bilateral knees today Ambulation/Gait Ambulation/Gait Assistance: 1: +2 Total assist Ambulation/Gait: Patient Percentage: 70% Ambulation Distance (Feet): 50 Feet Assistive device:  (pushing wheel chair) Ambulation/Gait Assistance Details: improved initiation and placement, assist for stability with ambulation Gait Pattern: Step-to pattern;Decreased stride length;Shuffle;Trunk flexed;Narrow base of support Gait velocity: decreased    Exercises Other Exercises Other Exercises: PROM cervical focus towards full cervical extension Other Exercises: Pt encouraged to continue cervical ROM into extenion against resistance from pillow in supine Other Exercises: Pt educated on use of single pillow     PT Goals (current goals can now be found in the care plan section) Acute Rehab PT Goals Patient Stated Goal: get back to work at the [employer] and do the [employer]ble PT Goal Formulation: With patient Time For Goal Achievement: 01/16/13 Potential to Achieve Goals: Good  Visit Information  Last PT Received On: 01/04/13 Assistance Needed: +2 History of Present  Illness: Ms.  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     Subjective Data  Subjective: I like jazz music Patient Stated Goal: get back to work at the [employer] and do the [employer] Arousal/Alertness: Awake/alert Behavior During Therapy: WFL for tasks assessed/performed Overall Cognitive Status: Within Functional Limits for tasks assessed    Balance  Balance Balance Assessed: Yes Static Sitting Balance Static Sitting - Balance Support: No upper extremity supported;Feet supported Static Sitting - Level of Assistance: 4: Min assist Static Sitting - Comment/# of Minutes: 14 mins EOB, 3 mins rest in chair Static Standing Balance Static Standing - Balance Support: Bilateral upper extremity supported;During functional activity Static Standing - Level of Assistance: 3: Mod assist;2: Max assist Static Standing - Comment/# of Minutes: 8 minutes for hygiene purposes and static rest break  End of Session PT - End of Session Equipment Utilized During Treatment: Gait belt Activity Tolerance: Patient limited by fatigue Patient left: in chair;with call bell/phone within reach Nurse Communication: Mobility status   GP     Fabio Asa 01/04/2013, 9:32 AM Charlotte Crumb, PT DPT  [phone]

## 2013-01-04 NOTE — Progress Notes (Signed)
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 EF 15-25% with mild to moderate RV dysfunction  8/18 Underwent HM II LVAD implant (under destination criteria) along with TV repair.  Taken back to the OR for RVAD support.  8/25 was taken back to OR for RVAD explant. 8/26 EGD which showed MW tear in esophagus. Injected and clipped. No further bleeding.  8/27  Back to OR for chest closure.  8/31 extubated  Levophed now off. Remains on dopa 3, dobut 4.Weaning nitric. [name] Continues to diurese (-2.6L)  but weight unchanged. Remains on broad spectrum abx. Fluconazole added yesterday for increasing WBC. WBC better today. CVP 14-15. MAPs 70s.  Feeling stronger. Starting to mobilize. Walked in unit this am  Tele: ST 105   Co-ox 68%  LDH 584. INR 2.05  LVAD INTERROGATION:   HeartMate II LVAD:  Flow 3.8 Liters/min  speed 8390  power 4.0, PI 5.8  5  Rare PI events  Objective:    Vital Signs:   Temp:  [97.4 F (36.3 C)-98.3 F (36.8 C)] 98.1 F (36.7 C) (09/04 0802) Pulse Rate:  [65-109] 102 (09/04 1100) Resp:  [17-29] 23 (09/04 1100) SpO2:  [89 %-100 %] 89 % (09/04 1100) Weight:  [82.7 kg (182 lb 5.1 oz)] 82.7 kg (182 lb 5.1 oz) (09/04 0600) Last BM Date: 12/31/12 Mean arterial Pressure 60-70s  Intake/Output:   Intake/Output Summary (Last 24 hours) at 01/04/13 1138 Last data filed at 01/04/13 1100  Gross per 24 hour  Intake 3132.4 ml  Output   3170 ml  Net  -37.6 ml     Physical Exam: General: Lyingin bed conversant HEENT: normal Neck: supple. JVP to ear Carotids 2+ bilat; no bruits. No lymphadenopathy or thryomegaly appreciated.  Cor: LVAD hum present.;Lungs: course Abdomen: soft, nontender, mildly distended. Hypoactive BS Driveline: C/D/I; securement device intact  Extremities: no cyanosis, clubbing, rash, 2+ edema.  Neuro: awake follows commands moves all 4 extremities w/o  difficulty. Affect pleasant  Telemetry: NSR 105  Labs: Basic Metabolic Panel:  Recent Labs Lab 01/01/13 1600 01/02/13 0400 01/02/13 1600 01/03/13 0335 01/03/13 2004 01/04/13 0420  NA 139 140 142 147* 142 146*  K 3.1* 3.1* 2.9* 3.0* 2.5* 3.1*  CL 96 94* 97 100  --  101  CO2 33* 37* 35* 38*  --  37*  GLUCOSE 173* 185* 185* 148* 141* 165*  BUN 34* 35* 37* 44*  --  46*  CREATININE 0.90 0.92 0.89 0.98  --  0.98  CALCIUM 9.2 9.6 9.5 9.9  --  9.5    Liver Function Tests:  Recent Labs Lab 12/31/12 0400 01/01/13 0420 01/02/13 0400 01/03/13 0335 01/04/13 0420  AST 41* 39* 29 33 32  ALT 35 35 27 27 25   ALKPHOS 186* 202* 164* 154* 143*  BILITOT 1.2 1.2 1.0 1.0 0.9  PROT 5.4* 6.0 6.0 5.9* 5.7*  ALBUMIN 2.2* 2.6* 2.6* 2.7* 2.7*   No results found for this basename: LIPASE, AMYLASE,  in the last 168 hours No results found for this basename: AMMONIA,  in the last 168 hours  CBC:  Recent Labs Lab 12/31/12 0400 01/01/13 0420 01/02/13 0400 01/03/13 0335 01/03/13 2004 01/04/13 0420  WBC 19.4* 23.8* 21.9* 23.2*  --  18.9*  HGB 9.3* 9.3* 8.6* 8.7* 9.5* 8.6*  HCT 27.7* 28.4* 27.4* 27.5* 28.0* 27.5*  MCV 86.6 89.0 91.0  92.0  --  93.9  PLT 498* 594* 595* 621*  --  611*    INR:  Recent Labs Lab 01/02/13 0400 01/02/13 1120 01/03/13 0335 01/03/13 1000 01/04/13 0420  INR 2.57* 1.93* 2.91* 2.15* 2.05*    Imaging: Dg Chest Port 1 View  01/04/2013   *RADIOLOGY REPORT*  Clinical Data: Status post insertion of ventricular assist device.  PORTABLE CHEST - 1 VIEW  Comparison: 01/03/2013  Findings: No pneumothorax or significant pleural fluid identified. Aeration of both lower lobes has improved.  Atelectasis remains bilaterally.  The heart size and mediastinal contours are stable. Positioning of central line and ventricular assist device are stable.  IMPRESSION: Improved aeration bilaterally.   Original Report Authenticated By: Irish Lack, M.D.   Dg Chest Port 1  View  01/03/2013   *RADIOLOGY REPORT*  Clinical Data: Left ventricular assist device.  PORTABLE CHEST - 1 VIEW  Comparison: 01/02/2013  Findings: Again noted is a left ventricular assist device.  There is a left cardiac ICD.  A feeding tube extends into the abdomen. Right central line tip appears to extend to the right atrium and unchanged.  There are stable patchy densities in the right lung with more confluent disease in the mid aspect.  These findings are unchanged.  Slightly improved aeration in the left lung.  Heart size appears to be upper limits of normal but unchanged.  Haziness at the right lung base may represent pleural fluid.  The left jugular central line has been removed.  IMPRESSION: Persistent patchy disease in the right lung and suspect right pleural fluid.  Slightly improved aeration in the left lung.  Support apparatuses as described.   Original Report Authenticated By: Richarda Overlie, M.D.     Medications:     Scheduled Medications: . amiodarone  200 mg Per NG tube BID  . antiseptic oral rinse  15 mL Mouth Rinse QID  . aspirin  324 mg Per Tube Daily  . aspirin  300 mg Rectal Daily  . chlorhexidine  15 mL Mouth Rinse BID  . citalopram  20 mg Oral Daily  . digoxin  0.125 mg Oral Daily  . feeding supplement  60 mL Per Tube BID  . feeding supplement (VITAL 1.5 CAL)  40 mL/hr Per Tube Q24H  . fluconazole (DIFLUCAN) IV  400 mg Intravenous Q24H  . imipenem-cilastatin  500 mg Intravenous Q8H  . insulin aspart  0-24 Units Subcutaneous Q4H  . insulin glargine  18 Units Subcutaneous BID  . levalbuterol  1.25 mg Nebulization Q6H  . magic mouthwash  5 mL Oral QID  . pantoprazole sodium  40 mg Per Tube BID  . sildenafil  40 mg Per Tube TID  . sodium chloride  10 mL Intravenous Q12H  . torsemide  20 mg Oral BID  . vancomycin  1,250 mg Intravenous Q24H  . warfarin  6 mg Oral ONCE-1800  . Warfarin - Physician Dosing Inpatient   Does not apply q1800    Infusions: . sodium chloride 20  mL/hr at 01/01/13 0400  . sodium chloride 20 mL/hr at 01/02/13 0150  . sodium chloride Stopped (12/28/12 0000)  . sodium chloride 30 mL/hr at 01/01/13 0400  . DOBUTamine 3 mcg/kg/min (01/04/13 0800)  . DOPamine 3 mcg/kg/min (01/04/13 0800)  . lactated ringers Stopped (12/28/12 0000)  . norepinephrine (LEVOPHED) Adult infusion      PRN Medications: acetaminophen (TYLENOL) oral liquid 160 mg/5 mL, ALPRAZolam, ondansetron (ZOFRAN) IV, ondansetron (ZOFRAN) IV, oxyCODONE-acetaminophen, potassium chloride, sodium chloride, sodium  chloride, traMADol   Assessment:   1. Acute on chronic systolic HF with biventricular HF EF 10%  --S/p HM II VAD with TV repair 12/18/12  --s/p RVAD. - explanted 8/25 2. 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 Cardiogenic shock  4. VT s/p Biotronik ICD on amio 5. Chronic renal failure, stage III 6. 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 ABL anemia  8. PNA 9. Severe protein calorie malnutrition - on TPN 10. Hyponatremia/Hypokalemia 11. 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 Acute respiratory failure  Plan/Discussion:    Improving steadily. Respiratory continues to improve. Agree with weaning nitric  Levophed is off. Continue to diurese. Will increase demadex to bid. Can wean dopamine as tolerated Continue sildenafil at 40 tid.  Leukocytosis better on fluconazole. Will continue. INR therapeutic. Watch LDH.   Supp K+  The patient is critically ill with multiple organ systems failure and requires high complexity decision making for assessment and support, frequent evaluation and titration of therapies, application of advanced monitoring technologies and extensive interpretation of multiple databases.   Critical Care Time devoted to patient care services described in this note is 35 Minutes.  Reuel Boom Judieth Mckown,MD 11:38 AM Length of Stay: 27 VAD Team Pager [phone] (7am - 7am) VAD Issue  Non-VAD issues HF Pager [phone]

## 2013-01-04 NOTE — Progress Notes (Signed)
NUTRITION FOLLOW-UP  INTERVENTION: Continue Vital 1.5 at 40 ml/h with 60 ml Prostat liquid protein BID. Goal regimen provides 1840 kcal, 125 grams protein, 733 ml free water and is currently meeting 100% of estimated protein and calorie needs. Diet advancement per MD/SLP. RD to continue to follow along for need for oral nutrition supplements with diet advancement. RD to continue to follow nutrition care plan.  NUTRITION DIAGNOSIS: Inadequate oral intake now related to inability to eat as evidenced by NPO status. Ongoing.  Goal: Intake to meet >90% of estimated nutrition needs. Met.  Monitor:  labs, weight trends, post-op healing and recovery, tolerance of nutrition support  ASSESSMENT: LVAD inserted on 8/18. Pt found to have R ventricular dysfunction s/p LVAD implantation, required trip to OR later that evening for placement of RVAD. RVAD removed 8/25. Underwent sternotomy incision closure and wound VAC wash out 8/26.   Underwent endoscopy on 8/26 - pt found to have Mallory Weiss tear, which was clipped by GI. Post-pyloric tube placed by radiology with the tip at the ligament of Treitz. Vital 1.5 started on 8/28. Extubated 8/31.  BSE completed by SLP 9/3 - recommendations for MBS or FEES. Per SLP note today, plan for formal swallow evaluation this afternoon.  Pt is currently receiving Vital 1.5 at 40 ml/hr (goal rate) with 60 ml Prostat liquid protein BID. This regimen provides: 1840 kcal, 125 grams protein, 733 ml free water. Pt with loose stools and rectal tube.   Height: Ht Readings from Last 1 Encounters:  12/22/12 5\' 9"  (1.753 m)    Weight: Wt Readings from Last 1 Encounters:  01/04/13 182 lb 5.1 oz (82.7 kg)  Admission wt: 165 lb Current wt is up 17 lb.  BMI:  Body mass index is 29.3 kg/(m^2) - using admission weight.  Estimated Nutritional Needs: Kcal: 1800 - 2000 Protein: 90 - 125 gm Fluid: 1.5 - 2 L  Skin:  Abdomen incision Chest incision  Diet Order:  NPO  EDUCATION NEEDS: -Education needs addressed. Provided many handouts on heart healthy eating, including label reading tips per patient request on 8/15.   Intake/Output Summary (Last 24 hours) at 01/04/13 0910 Last data filed at 01/04/13 0800  Gross per 24 hour  Intake 3087.2 ml  Output   3220 ml  Net -132.8 ml    Last BM: 8/31  Labs:   Recent Labs Lab 01/02/13 1600 01/03/13 0335 01/03/13 2004 01/04/13 0420  NA 142 147* 142 146*  K 2.9* 3.0* 2.5* 3.1*  CL 97 100  --  101  CO2 35* 38*  --  37*  BUN 37* 44*  --  46*  CREATININE 0.89 0.98  --  0.98  CALCIUM 9.5 9.9  --  9.5  GLUCOSE 185* 148* 141* 165*    CBG (last 3)   Recent Labs  01/04/13 0433 01/04/13 0438 01/04/13 0801  GLUCAP 155* 156* 137*   Prealbumin  Date/Time Value Range Status  12/25/2012  3:39 AM 5.9* 17.0 - 34.0 mg/dL Final     Performed at [facility]   Triglycerides  Date/Time Value Range Status  12/25/2012  3:39 AM 159* <150 mg/dL Final    Scheduled Meds: . acetaZOLAMIDE  500 mg Oral Daily  . amiodarone  200 mg Per NG tube BID  . antiseptic oral rinse  15 mL Mouth Rinse QID  . aspirin  324 mg Per Tube Daily  . aspirin  300 mg Rectal Daily  . chlorhexidine  15 mL Mouth Rinse BID  .  citalopram  20 mg Oral Daily  . digoxin  0.125 mg Oral Daily  . feeding supplement  60 mL Oral BID  . feeding supplement (VITAL 1.5 CAL)  40 mL/hr Per Tube Q24H  . fluconazole (DIFLUCAN) IV  400 mg Intravenous Q24H  . imipenem-cilastatin  500 mg Intravenous Q8H  . insulin aspart  0-24 Units Subcutaneous Q4H  . insulin glargine  18 Units Subcutaneous BID  . levalbuterol  1.25 mg Nebulization Q6H  . magic mouthwash  5 mL Oral QID  . pantoprazole sodium  40 mg Per Tube BID  . sildenafil  40 mg Per Tube TID  . sodium chloride  10 mL Intravenous Q12H  . torsemide  20 mg Oral BID  . vancomycin  1,250 mg Intravenous Q24H  . Warfarin - Physician Dosing Inpatient   Does not apply q1800     Continuous Infusions: . sodium chloride 20 mL/hr at 01/01/13 0400  . sodium chloride 20 mL/hr at 01/02/13 0150  . sodium chloride Stopped (12/28/12 0000)  . sodium chloride 30 mL/hr at 01/01/13 0400  . DOBUTamine 3 mcg/kg/min (01/04/13 0800)  . DOPamine 3 mcg/kg/min (01/04/13 0800)  . lactated ringers Stopped (12/28/12 0000)  . norepinephrine (LEVOPHED) Adult infusion      Jarold Motto MS, RD, LDN Pager: [phone] After-hours pager: [phone]

## 2013-01-04 NOTE — Clinical Documentation Improvement (Signed)
THIS DOCUMENT IS NOT A PERMANENT PART OF THE MEDICAL RECORD  Please update your documentation with the medical record to reflect your response to this query. If you need help knowing how to do this please call [phone].  01/04/13  Dear Dr.Van Maudie Flakes Marton Redwood,  In a better effort to capture your patient's severity of illness, reflect appropriate length of stay and utilization of resources, a review of the patient medical record has revealed the following indicators.    Based on your clinical judgment, please clarify and document in a progress note and/or discharge summary the clinical condition associated with the following supporting information:    Possible Clinical Conditions?     Acute Respiratory Failure     Vent Dependent Respiratory Failure     Other Condition     Cannot Clinically Determine      Risk Factors: Extubated to 50% VM per 12/31/12 Respiratory note.                  You may use possible, probable, or suspect with inpatient documentation. possible, probable, suspected diagnoses MUST be documented at the time of discharge  Reviewed: Acute Respiratory Failure documented per 9/04 progress notes.  Thank You,  Marciano Sequin,  Clinical Documentation Specialist: [phone] Health Information Management [place]

## 2013-01-04 NOTE — Progress Notes (Signed)
Planning for MBS this pm to evaluate swallowing function pending MD order and approval to leave the floor.   Ferdinand Lango MA, CCC-SLP [phone]

## 2013-01-05 ENCOUNTER — Inpatient Hospital Stay (HOSPITAL_COMMUNITY): Payer: Commercial Managed Care - PPO

## 2013-01-05 DIAGNOSIS — Z515 Encounter for palliative care: Secondary | ICD-10-CM

## 2013-01-05 DIAGNOSIS — I5022 Chronic systolic (congestive) heart failure: Secondary | ICD-10-CM

## 2013-01-05 DIAGNOSIS — N179 Acute kidney failure, unspecified: Secondary | ICD-10-CM

## 2013-01-05 DIAGNOSIS — I472 Ventricular tachycardia: Secondary | ICD-10-CM

## 2013-01-05 DIAGNOSIS — N189 Chronic kidney disease, unspecified: Secondary | ICD-10-CM

## 2013-01-05 LAB — POCT I-STAT 4, (NA,K, GLUC, HGB,HCT)
Glucose, Bld: 113 mg/dL — ABNORMAL HIGH (ref 70–99)
Potassium: 3.4 mEq/L — ABNORMAL LOW (ref 3.5–5.1)
Sodium: 143 mEq/L (ref 135–145)

## 2013-01-05 LAB — COMPREHENSIVE METABOLIC PANEL
ALT: 27 U/L (ref 0–35)
AST: 34 U/L (ref 0–37)
Albumin: 2.7 g/dL — ABNORMAL LOW (ref 3.5–5.2)
Alkaline Phosphatase: 136 U/L — ABNORMAL HIGH (ref 39–117)
BUN: 46 mg/dL — ABNORMAL HIGH (ref 6–23)
CO2: 33 mEq/L — ABNORMAL HIGH (ref 19–32)
Calcium: 9.4 mg/dL (ref 8.4–10.5)
Chloride: 104 mEq/L (ref 96–112)
Creatinine, Ser: 1.02 mg/dL (ref 0.50–1.10)
GFR calc Af Amer: 65 mL/min — ABNORMAL LOW (ref >90)
GFR calc non Af Amer: 56 mL/min — ABNORMAL LOW (ref >90)
Glucose, Bld: 150 mg/dL — ABNORMAL HIGH (ref 70–99)
Potassium: 3.4 mEq/L — ABNORMAL LOW (ref 3.5–5.1)
Sodium: 145 mEq/L (ref 135–145)
Total Bilirubin: 0.9 mg/dL (ref 0.3–1.2)
Total Protein: 5.5 g/dL — ABNORMAL LOW (ref 6.0–8.3)

## 2013-01-05 LAB — GLUCOSE, CAPILLARY
Glucose-Capillary: 131 mg/dL — ABNORMAL HIGH (ref 70–99)
Glucose-Capillary: 71 mg/dL (ref 70–99)

## 2013-01-05 LAB — LACTATE DEHYDROGENASE: LDH: 543 U/L — ABNORMAL HIGH (ref 94–250)

## 2013-01-05 LAB — CARBOXYHEMOGLOBIN
Carboxyhemoglobin: 1.6 % — ABNORMAL HIGH (ref 0.5–1.5)
Carboxyhemoglobin: 1.6 % — ABNORMAL HIGH (ref 0.5–1.5)
Carboxyhemoglobin: 1.9 % — ABNORMAL HIGH (ref 0.5–1.5)
Methemoglobin: 0.8 % (ref 0.0–1.5)
Methemoglobin: 0.8 % (ref 0.0–1.5)
Methemoglobin: 1.3 % (ref 0.0–1.5)
O2 Saturation: 44.1 %
O2 Saturation: 41.6 %
O2 Saturation: 55.9 %
Total hemoglobin: 8.2 g/dL — ABNORMAL LOW (ref 12.0–16.0)
Total hemoglobin: 9.2 g/dL — ABNORMAL LOW (ref 12.0–16.0)
Total hemoglobin: 8.5 g/dL — ABNORMAL LOW (ref 12.0–16.0)

## 2013-01-05 LAB — CBC
HCT: 27 % — ABNORMAL LOW (ref 36.0–46.0)
Hemoglobin: 8.4 g/dL — ABNORMAL LOW (ref 12.0–15.0)
MCH: 29.2 pg (ref 26.0–34.0)
MCHC: 31.1 g/dL (ref 30.0–36.0)
MCV: 93.8 fL (ref 78.0–100.0)
Platelets: 579 10*3/uL — ABNORMAL HIGH (ref 150–400)
RBC: 2.88 MIL/uL — ABNORMAL LOW (ref 3.87–5.11)
RDW: 17.4 % — ABNORMAL HIGH (ref 11.5–15.5)
WBC: 19 10*3/uL — ABNORMAL HIGH (ref 4.0–10.5)

## 2013-01-05 LAB — PROTIME-INR
INR: 2.66 — ABNORMAL HIGH (ref 0.00–1.49)
Prothrombin Time: 27.4 seconds — ABNORMAL HIGH (ref 11.6–15.2)

## 2013-01-05 MED ORDER — FLUCONAZOLE IN SODIUM CHLORIDE 200-0.9 MG/100ML-% IV SOLN
200.0000 mg | INTRAVENOUS | Status: AC
  Administered 2013-01-05 – 2013-01-07 (×3): 200 mg via INTRAVENOUS
  Filled 2013-01-05 (×3): qty 100

## 2013-01-05 MED ORDER — TORSEMIDE 20 MG/2ML IV SOLN
20.0000 mg | Freq: Once | INTRAVENOUS | Status: DC
  Filled 2013-01-05: qty 2

## 2013-01-05 MED ORDER — ALPRAZOLAM 0.25 MG PO TABS
0.5000 mg | ORAL_TABLET | Freq: Every evening | ORAL | Status: DC | PRN
  Administered 2013-01-09: 0.5 mg via ORAL
  Filled 2013-01-05: qty 2

## 2013-01-05 MED ORDER — POTASSIUM CHLORIDE 10 MEQ/50ML IV SOLN
10.0000 meq | INTRAVENOUS | Status: AC | PRN
  Administered 2013-01-05 (×3): 10 meq via INTRAVENOUS

## 2013-01-05 MED ORDER — TORSEMIDE 20 MG PO TABS
20.0000 mg | ORAL_TABLET | Freq: Two times a day (BID) | ORAL | Status: DC
  Administered 2013-01-06 – 2013-01-14 (×17): 20 mg via ORAL
  Filled 2013-01-05 (×21): qty 1

## 2013-01-05 MED ORDER — WARFARIN SODIUM 4 MG PO TABS
4.0000 mg | ORAL_TABLET | Freq: Once | ORAL | Status: AC
  Administered 2013-01-05: 4 mg via ORAL
  Filled 2013-01-05: qty 1

## 2013-01-05 MED ORDER — GLUCERNA SHAKE PO LIQD
237.0000 mL | ORAL | Status: DC
  Administered 2013-01-05 – 2013-01-07 (×3): 237 mL via ORAL

## 2013-01-05 MED ORDER — FUROSEMIDE 10 MG/ML IJ SOLN
40.0000 mg | Freq: Once | INTRAMUSCULAR | Status: AC
  Administered 2013-01-05: 40 mg via INTRAVENOUS
  Filled 2013-01-05: qty 4

## 2013-01-05 MED ORDER — POTASSIUM CHLORIDE 10 MEQ/50ML IV SOLN
10.0000 meq | Freq: Once | INTRAVENOUS | Status: AC
  Administered 2013-01-05: 10 meq via INTRAVENOUS

## 2013-01-05 MED ORDER — POTASSIUM CHLORIDE 10 MEQ/50ML IV SOLN
10.0000 meq | INTRAVENOUS | Status: AC
  Administered 2013-01-05 (×4): 10 meq via INTRAVENOUS

## 2013-01-05 MED ORDER — WARFARIN - PHARMACIST DOSING INPATIENT
Freq: Every day | Status: DC
  Administered 2013-01-07 – 2013-01-12 (×3)

## 2013-01-05 MED ORDER — LEVALBUTEROL HCL 1.25 MG/0.5ML IN NEBU
1.2500 mg | INHALATION_SOLUTION | Freq: Four times a day (QID) | RESPIRATORY_TRACT | Status: DC | PRN
  Filled 2013-01-05: qty 0.5

## 2013-01-05 MED ORDER — INSULIN GLARGINE 100 UNIT/ML ~~LOC~~ SOLN
12.0000 [IU] | Freq: Two times a day (BID) | SUBCUTANEOUS | Status: DC
  Administered 2013-01-05: 12 [IU] via SUBCUTANEOUS
  Filled 2013-01-05 (×3): qty 0.12

## 2013-01-05 NOTE — Progress Notes (Signed)
Occupational Therapy Treatment Patient Details Name: MAIRIN LINDSLEY MRN: [id number] DOB: [birth year] Today's Date: 01/05/2013 Time: 0826-0901 OT Time Calculation (min): 35 min  OT Assessment / Plan / Recommendation  History of present illness Ms. 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    OT comments  Pt with decreased activity tolerance today.  Complaint of dizziness and fatigue in standing - VSS.  Pt. Incontinent of bowel and stood x ~11 mins for peri care, then was too fatigued to ambulate and was transferred to chair.  Pt requires total A for LVAD equipment management.  Will continue to work on improving activity tolerance and pt instruction re: precautions and equipment management  Follow Up Recommendations  CIR    Barriers to Discharge       Equipment Recommendations  3 in 1 bedside comode;Tub/shower bench    Recommendations for Other Services Rehab consult  Frequency Min 3X/week   Progress towards OT Goals Progress towards OT goals: Progressing toward goals  Plan Discharge plan remains appropriate    Precautions / Restrictions Precautions Precautions: Sternal;Fall Precaution Comments: LVAD Required Braces or Orthoses:  (LVAD equipment and black bag) Restrictions Weight Bearing Restrictions: No   Pertinent Vitals/Pain     ADL  Eating/Feeding: Set up Where Assessed - Eating/Feeding: Chair Lower Body Bathing: +1 Total assistance Where Assessed - Lower Body Bathing: Supported sit to  stand Toilet Transfer: +2 Total assistance Toilet Transfer: Patient Percentage: 70% Toilet Transfer Method: Sit to stand;Stand pivot Acupuncturist: Bedside commode Toileting - Clothing Manipulation and Hygiene: +1 Total assistance Where Assessed - Engineer, mining and Hygiene: Standing Equipment Used: Wheelchair;Other (comment) (o2; LVAD equip) Transfers/Ambulation Related to ADLs: +2 total A (pt ~70%).  Pt limited by dizziness and bowel incontinence ADL Comments: Pt intructed in self test, LVAD equipment, and switching module to battery system.  Pt with grip weakness making it difficult to maintain adequate pressure on button for self test - required max A.  Pt. required total A to switch module to battery and back to power source.  Pt incontinent of bowel when standing.  Required total A for peri care in standiing, then pt too fatigued to ambulate and transferred to chair    OT Diagnosis:    OT Problem List:   OT Treatment Interventions:     OT Goals(current goals can now be found in the care plan section) Acute Rehab OT Goals Patient Stated Goal: get back to work at the [employer] and do the wobble ADL Goals Pt Will Perform Grooming: with supervision;with set-up;sitting Pt Will Perform Upper Body Bathing: with set-up;with supervision;sitting Pt Will Transfer to Toilet: with supervision;ambulating;bedside commode Pt Will Perform Toileting - Clothing Manipulation and hygiene: with supervision;sit to/from stand Additional ADL Goal #1: Pt will connect/disconnect to/from battery system with family assisting Additional ADL Goal #2: Pt will use AE PRN to complete LB ADL with S of caregivers.  Visit Information  Last OT Received On: 01/05/13 Assistance Needed: +2 PT/OT Co-Evaluation/Treatment: Yes History of Present Illness: Ms. 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 EF 15-25% with mild to moderate RV  dysfunction.Underwent HM II LVAD implant (under destination criteria) along with TV repair on 8/18. HeartMate 2 implantation with subsequent emergency centri- mag RVAD because of RV dysfunction. Patient had stable hemodynamics on BiVAD support.Returned to OR 8-25 for successful removal of RVAD Responsive but comfortable with IV sedation. Had successful delayed sternal closure 8-28-014     Subjective Data      Prior Functioning       Cognition  Cognition Arousal/Alertness: Awake/alert Behavior During Therapy: Flat affect Overall Cognitive Status: Within Functional Limits for tasks assessed    Mobility  Bed Mobility Bed Mobility: Rolling Right;Right Sidelying to Sit Rolling Right: 3: Mod assist Right Sidelying to Sit: 3: Mod assist;HOB elevated Details for Bed Mobility Assistance: vc for sternal precautions. PT with improved bed mobility today Transfers Transfers: Sit to Stand;Stand to Sit Sit to Stand: 3: Mod assist;Without upper extremity assist;From bed Stand to Sit: 3: Mod assist Details for Transfer Assistance: Improved ability today with rocker technique    Exercises  Other Exercises Other Exercises: AROM cervical extension x 10   Balance Balance Balance Assessed: Yes Static Sitting Balance Static Sitting - Balance Support: No upper extremity supported;Feet supported Static Sitting - Level of Assistance: 4: Min assist Static Sitting - Comment/# of Minutes: 6 minutes at EOB Static Standing Balance Static Standing - Balance Support: Bilateral upper extremity supported;During functional activity Static Standing - Level of Assistance: 3: Mod assist;2: Max assist Static Standing - Comment/# of Minutes: 11 mins   End of Session OT - End of Session Equipment Utilized During Treatment: Oxygen (w/c; LVAD equipment) Activity Tolerance: Patient limited by fatigue;Other (comment) (bowel incontinence) Patient left: in chair;with call bell/phone within reach Nurse Communication:  Mobility status  GO     Selah Klang, Ursula Alert M 01/05/2013, 10:19 AM

## 2013-01-05 NOTE — Progress Notes (Signed)
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    PT Comments   Met with patient to perform education BM:WUXL equipment management for mobility. Patient with decreased focus to task, difficulty with sequencing. Pt demonstrated some minimal recall of initial education but continued to required max teach backs.  Worked on checking battery life, clipping batteries to clips with arrows aligned, we discussed some alarms and worked minimally on converting from power line to batteries. During session, patient was fluctuating with O2 saturations dropping into the mid to upper 80s at times. At this time, question amount of retention. Will continue to work with patient and progress education and management as tolerated. Assisted patient back to bed with nsg and advised nsg of O2 desaturations. Will continue to follow as indicated.  Follow Up Recommendations  CIR           Equipment Recommendations   (ROLLATOR WALKER WITH SEAT)    Recommendations for Other Services OT consult;Rehab consult  Frequency Min 5X/week      Plan Current plan remains appropriate    Precautions / Restrictions Precautions Precautions: Sternal;Fall Precaution Comments: LVAD Required Braces or Orthoses:  (LVAD  equipment and black bag)   Pertinent Vitals/Pain SpO2 dropping to mid 80's     Mobility  Bed Mobility Bed Mobility: Sit to Supine Sit to Supine: 1: +2 Total assist Sit to Supine: Patient Percentage: 40% Transfers Transfers: Sit to Stand;Stand to Sit Sit to Stand: 3: Mod assist;Without upper extremity assist;From chair/3-in-1 Stand to Sit: 3: Mod assist;To bed Details for Transfer Assistance: Improved ability today with rocker technique         Visit Information  Last PT Received On: 01/05/13 Assistance Needed: +2 History of Present Illness: Kaitlin [name] is a 67 y/o woman with h/o severe HTN, [name], 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     Subjective Data  Subjective: i just feel so wiped out   Cognition  Cognition Arousal/Alertness: Awake/alert Behavior During Therapy: Flat affect Overall Cognitive Status: Within Functional Limits for tasks assessed       End of Session PT - End of Session Activity Tolerance: Patient limited by fatigue Patient left: in bed;with call bell/phone within reach;with nursing/sitter in room Nurse Communication: Mobility status   GP     Fabio Asa 01/05/2013, 4:14 PM Charlotte Crumb, PT DPT  [phone]

## 2013-01-05 NOTE — Progress Notes (Signed)
Pharmacy Consult Note  Pharmacy Consult for warfarin  Indication: LVAD  Pharmacy Consult for Vancomycin, Imipenem, Fluconazole Indication: Persistent leukocytosis, possible pneumonia  No Known Allergies  Patient Measurements: Height: 5\' 9"  (175.3 cm) Weight: 181 lb 10.5 oz (82.4 kg) IBW/kg (Calculated) : 66.2  Vital Signs: Temp: 97.9 F (36.6 C) (09/05 0735) Temp src: Oral (09/05 0735) Pulse Rate: 71 (09/05 0900)  Labs:  Recent Labs  01/02/13 1600 01/02/13 2210  01/03/13 0335 01/03/13 1000 01/03/13 2004 01/04/13 0420 01/04/13 1500 01/05/13 0400  HGB  --   --   < > 8.7*  --  9.5* 8.6*  --  8.4*  HCT  --   --   < > 27.5*  --  28.0* 27.5*  --  27.0*  PLT  --   --   --  621*  --   --  611*  --  579*  APTT 79* 81*  --  80*  --   --   --   --   --   LABPROT  --   --   --  29.4* 23.3*  --  22.5*  --  27.4*  INR  --   --   --  2.91* 2.15*  --  2.05*  --  2.66*  CREATININE 0.89  --   --  0.98  --   --  0.98 1.00 1.02  < > = values in this interval not displayed.  Estimated Creatinine Clearance: 63.1 ml/min (by C-G formula based on Cr of 1.02).  Assessment: 67 yo F s/p LVAD and Tricuspid Valve repair on 8/18.  Pharmacy to now manage coumadin dosing with goal INR of 2-2.5. Patient has now transitioned off bival and INR is trending up and now at desired range at 2.6 this morning. Patient did have significant rise in INR overnight likely due to slight dose adjustments and may be seeing effects of fluconazole which was added 9/3. Will reduce warfarin dose to 4mg  tonight and dose cautiously over weekend as fluconazole is to continue for 3 more doses.  Vancomycin (Day #19) + primaxin (Day #9) + fluconazole (Day#3) for PNA and persistent leukocytosis. WBC elevated but stable. SCr stable. Afebrile. Vancomycin adjusted 9/4. Given that patient is also on amiodarone and having pvc's on monitor will reduce dose of fluconazole and stop date of fluconazole after 5 days.  Goal of Therapy:   INR goal 2-2.5 Monitor platelets by anticoagulation protocol: Yes Vancomycin trough 15-20   Plan:  Continue vancomycin 1250mg  IV q24 hours -recheck trough next week Continue primaxin 500mg  q8 hours Continue fluconazole 200mg  q24 x3 more doses Warfarin 4mg  tonight Daily INR - watch closely   Sheppard Coil PharmD., BCPS Clinical Pharmacist Pager [phone] 01/05/2013 9:51 AM

## 2013-01-05 NOTE — Progress Notes (Signed)
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 EF 15-25% with mild to moderate RV dysfunction  8/18 Underwent HM II LVAD implant (under destination criteria) along with TV repair.  Taken back to the OR for RVAD support.  8/25 was taken back to OR for RVAD explant. 8/26 EGD which showed MW tear in esophagus. Injected and clipped. No further bleeding.  8/27  Back to OR for chest closure.  8/31 extubated  Levophed now off. Yesterday nitric weaned off, dopamine weaned to 1 mcg, and dobutamine at 3 mcg.  Weight up 1 pound. Remains on broad spectrum abx and fluconazole. WBC 19.  CVP 13. MAPs 70s. No PI events.  Ambulated 50 feet.   Tele: SR 90s   Co-ox 68>44 (?). INR 2.66  LVAD INTERROGATION:   HeartMate II LVAD:  Flow 3.5 Liters/min  speed 8400  power 3.2, PI 4.7 No PI events  Objective:    Vital Signs:   Temp:  [97.2 F (36.2 C)-98.8 F (37.1 C)] 97.9 F (36.6 C) (09/05 0735) Pulse Rate:  [41-109] 92 (09/05 0700) Resp:  [16-26] 21 (09/05 0700) SpO2:  [87 %-100 %] 95 % (09/05 0700) Weight:  [181 lb 10.5 oz (82.4 kg)] 181 lb 10.5 oz (82.4 kg) (09/05 0500) Last BM Date: 12/31/12 Mean arterial Pressure 70s  Intake/Output:   Intake/Output Summary (Last 24 hours) at 01/05/13 0802 Last data filed at 01/05/13 0700  Gross per 24 hour  Intake 3008.9 ml  Output   2810 ml  Net  198.9 ml     Physical Exam: General: Lyingin bed conversant HEENT: normal Neck: supple. JVP to ear Carotids 2+ bilat; no bruits. No lymphadenopathy or thryomegaly appreciated.  Cor: LVAD hum present.;Lungs: course Abdomen: soft, nontender, mildly distended. Hypoactive BS Driveline: C/D/I; securement device intact  Extremities: no cyanosis, clubbing, rash, 1-2+ edema.  Neuro: awake follows commands moves all 4 extremities w/o difficulty. Affect pleasant  Telemetry: NSR 90s  Labs: Basic Metabolic  Panel:  Recent Labs Lab 01/02/13 1600 01/03/13 0335 01/03/13 2004 01/04/13 0420 01/04/13 1500 01/05/13 0400  NA 142 147* 142 146* 143 145  K 2.9* 3.0* 2.5* 3.1* 2.8* 3.4*  CL 97 100  --  101 100 104  CO2 35* 38*  --  37* 33* 33*  GLUCOSE 185* 148* 141* 165* 187* 150*  BUN 37* 44*  --  46* 46* 46*  CREATININE 0.89 0.98  --  0.98 1.00 1.02  CALCIUM 9.5 9.9  --  9.5 9.5 9.4    Liver Function Tests:  Recent Labs Lab 01/01/13 0420 01/02/13 0400 01/03/13 0335 01/04/13 0420 01/05/13 0400  AST 39* 29 33 32 34  ALT 35 27 27 25 27   ALKPHOS 202* 164* 154* 143* 136*  BILITOT 1.2 1.0 1.0 0.9 0.9  PROT 6.0 6.0 5.9* 5.7* 5.5*  ALBUMIN 2.6* 2.6* 2.7* 2.7* 2.7*   No results found for this basename: LIPASE, AMYLASE,  in the last 168 hours No results found for this basename: AMMONIA,  in the last 168 hours  CBC:  Recent Labs Lab 01/01/13 0420 01/02/13 0400 01/03/13 0335 01/03/13 2004 01/04/13 0420 01/05/13 0400  WBC 23.8* 21.9* 23.2*  --  18.9* 19.0*  HGB 9.3* 8.6* 8.7* 9.5* 8.6* 8.4*  HCT 28.4* 27.4* 27.5* 28.0* 27.5* 27.0*  MCV 89.0 91.0 92.0  --  93.9 93.8  PLT 594* 595*  621*  --  611* 579*    INR:  Recent Labs Lab 01/02/13 1120 01/03/13 0335 01/03/13 1000 01/04/13 0420 01/05/13 0400  INR 1.93* 2.91* 2.15* 2.05* 2.66*    Imaging: Dg Chest Port 1 View  01/05/2013   *RADIOLOGY REPORT*  Clinical Data: Follow-up ventricular assist device.  PORTABLE CHEST - 1 VIEW  Comparison: 01/04/2013.  Findings: The pacer wire / AICD is stable and the right subclavian catheter is stable The feeding tube is stable.  The left ventricular assist device is unchanged.  The heart remains enlarged with persistent vascular congestion and areas of perihilar atelectasis.  Small pleural effusions are suspected.  No pneumothorax.  IMPRESSION:  1.  Stable support apparatus. 2.  Persistent perihilar atelectasis, vascular congestion and small left effusion.   Original Report Authenticated By: Rudie Meyer, M.D.   Dg Chest Port 1 View  01/04/2013   *RADIOLOGY REPORT*  Clinical Data: Status post insertion of ventricular assist device.  PORTABLE CHEST - 1 VIEW  Comparison: 01/03/2013  Findings: No pneumothorax or significant pleural fluid identified. Aeration of both lower lobes has improved.  Atelectasis remains bilaterally.  The heart size and mediastinal contours are stable. Positioning of central line and ventricular assist device are stable.  IMPRESSION: Improved aeration bilaterally.   Original Report Authenticated By: Irish Lack, M.D.     Medications:     Scheduled Medications: . amiodarone  200 mg Per NG tube BID  . antiseptic oral rinse  15 mL Mouth Rinse QID  . aspirin  324 mg Per Tube Daily  . aspirin  300 mg Rectal Daily  . chlorhexidine  15 mL Mouth Rinse BID  . citalopram  20 mg Oral Daily  . digoxin  0.125 mg Oral Daily  . feeding supplement  60 mL Per Tube BID  . feeding supplement (VITAL 1.5 CAL)  40 mL/hr Per Tube Q24H  . fluconazole (DIFLUCAN) IV  400 mg Intravenous Q24H  . imipenem-cilastatin  500 mg Intravenous Q8H  . insulin aspart  0-24 Units Subcutaneous Q4H  . insulin glargine  18 Units Subcutaneous BID  . levalbuterol  1.25 mg Nebulization Q6H  . magic mouthwash  5 mL Oral QID  . pantoprazole sodium  40 mg Per Tube BID  . sildenafil  40 mg Per Tube TID  . sodium chloride  10 mL Intravenous Q12H  . torsemide  20 mg Oral BID  . vancomycin  1,250 mg Intravenous Q24H  . Warfarin - Physician Dosing Inpatient   Does not apply q1800    Infusions: . sodium chloride 20 mL/hr at 01/01/13 0400  . sodium chloride 20 mL/hr at 01/02/13 0150  . sodium chloride 20 mL/hr (01/04/13 1419)  . sodium chloride 20 mL/hr at 01/05/13 0400  . DOBUTamine 3 mcg/kg/min (01/05/13 0400)  . DOPamine 1 mcg/kg/min (01/05/13 0400)  . lactated ringers Stopped (12/28/12 0000)  . norepinephrine (LEVOPHED) Adult infusion      PRN Medications: acetaminophen (TYLENOL) oral  liquid 160 mg/5 mL, ALPRAZolam, ondansetron (ZOFRAN) IV, ondansetron (ZOFRAN) IV, oxyCODONE-acetaminophen, potassium chloride, sodium chloride, sodium chloride, traMADol   Assessment:   1. Acute on chronic systolic HF with biventricular HF EF 10%  --S/p HM II VAD with TV repair 12/18/12  --s/p RVAD. - explanted 8/25 2. 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 Cardiogenic shock  4. VT s/p Biotronik ICD on amio 5. Chronic renal failure, stage III 6. 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 ABL anemia  8. PNA 9. Severe protein calorie malnutrition - on TPN 10. Hyponatremia/Hypokalemia 11. 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 Acute respiratory failure  Plan/Discussion:    Clinically looking great. Weaning inotropes. Ne events on VAD. Unsure if co-ox is accurate or not. Agree with repeating. Can titrate dobutamine as needed   Volume status remains elevated. Continue  demadex 20 bid.   Mobilize. May be able to stop TFs today.   WBC unchanged. On broad spectrum antibiotics and fluconazole.    INR therapeutic. Pharmacy to dose coumadin.   [name],[name],MD 8:02 AM Length of Stay: 28 VAD Team Pager [phone] (7am - 7am) VAD Issue Non-VAD issues HF Pager [phone]  Patient seen and examined with Tonye Becket, NP. We discussed all aspects of the encounter. I agree with the assessment and plan as stated above. I have edited text above to reflect my changes. She is improving slowly. Continue to diurese and wean inotropes as possible. Repeat co-ox.   Daniel Bensimhon,MD 8:27 AM

## 2013-01-05 NOTE — Progress Notes (Signed)
Patient ID: Kaitlin [name], female   DOB: 12/20/1945, 67 y.o.   MRN: [id number]  SICU Evening Rounds:  Hemodynamically stable  MAP 78   Started back on NO due to decreased COOx. Last one was 55.9 this afternoon.  Speed increased to 8600 today.  Reportedly still having some wide complex tachycardia but not sustained.  K+ low and being repleated.  Have diarrhea. Tube feeds stopped.  Urine output good.

## 2013-01-05 NOTE — Progress Notes (Signed)
. HeartMate 2 Rounding Note  Subjective:   Postop day #18   HeartMate 2 implantation with subsequent emergency centri- mag RVAD because of  RV dysfunction. Patient  had  stable hemodynamics on BiVAD support.Returned to OR 8-25 for successful removal of RVAD   Responsive but comfortable with IV sedation.  Had successful delayed sternal closure  8-28-014  Patient continues to have stable hemodynamics after chest closure Postop 2D echo 9-2 shows sig improvement of RV fx  Patient was successfully extubated postop day #13. She has since been out of bed to chair.yesterday she walked 50 ft. today she walked to the hallway and back to bed but is been up in the chair to 3 times   Low-dose nitric oxide 10 ppm has been added to her nasal cannula oxygen circuit because of low co-ox oxygen saturation off nitric oxide  A postop, postextubation 2-D echocardiogram shows significant improvement in RV function and decrease in tricuspid regurgitation  Patient's mental status and neuro status is intact. She remains generally weak but is now receiving physical therapy.  Plan speech therapy evaluation for swallowing- passed FEES eval today so will start D-III diet, feeding tube removed   UGI bleed from M-W tear- now inactive, on protonix  On coumadin , ASA with goal INR 2.0-2.5 LDH sl increased today  Feeding tube post-pyloric and Vital 1.5 started--rectal tube placed for diarrhea tube feeds at goal   diuresis for fluid overload- lasix dripoff and on po demedex  WBC >30k> 25>20>18 with procalcitonin 1.3>1.0 on Vanc, Primaxin and short course of IV Diflucan. Cultures neg Primaxin stop today continues Vanco and Diflucan  Surgical incisions healing- daily VAD drive line dressing changed daily  LVAD INTERROGATION:  HeartMate II LVAD:  Flow 3.5 liters/min, speed 8600 power 3.2, PI 4.7.   Objective:    Vital Signs:   Temp:  [97.9 F (36.6 C)-98.8 F (37.1 C)] 98.4 F (36.9 C) (09/05 1628) Pulse Rate:   [41-98] 94 (09/05 1800) Resp:  [16-36] 26 (09/05 1800) SpO2:  [86 %-100 %] 99 % (09/05 1800) Weight:  [181 lb 10.5 oz (82.4 kg)] 181 lb 10.5 oz (82.4 kg) (09/05 0500) Last BM Date: 12/31/12 Mean arterial Pressure 84  Intake/Output:   Intake/Output Summary (Last 24 hours) at 01/05/13 1856 Last data filed at 01/05/13 1800  Gross per 24 hour  Intake 2685.4 ml  Output   2475 ml  Net  210.4 ml     Physical Exam: General: extubated, responsive, comfortable HEENT: normal Neck: supple. JVP normal  No lymphadenopathy or thryomegaly appreciated. Cor: Mechanical heart sounds with LVAD hum present. Lungs: clear Abdomen: soft, nontender, nondistended. No hepatosplenomegaly. No bruits or masses. Good bowel sounds. Extremities: no cyanosis, clubbing, rash,  Sig edema remains  extremities warm Neuro: moves all 4 extremities  Telemetry: sinus 104/min  Labs: Basic Metabolic Panel:  Recent Labs Lab 01/02/13 1600 01/03/13 0335 01/03/13 2004 01/04/13 0420 01/04/13 1500 01/05/13 0400 01/05/13 1700  NA 142 147* 142 146* 143 145 143  K 2.9* 3.0* 2.5* 3.1* 2.8* 3.4* 3.4*  CL 97 100  --  101 100 104  --   CO2 35* 38*  --  37* 33* 33*  --   GLUCOSE 185* 148* 141* 165* 187* 150* 113*  BUN 37* 44*  --  46* 46* 46*  --   CREATININE 0.89 0.98  --  0.98 1.00 1.02  --   CALCIUM 9.5 9.9  --  9.5 9.5 9.4  --  Liver Function Tests:  Recent Labs Lab 01/01/13 0420 01/02/13 0400 01/03/13 0335 01/04/13 0420 01/05/13 0400  AST 39* 29 33 32 34  ALT 35 27 27 25 27   ALKPHOS 202* 164* 154* 143* 136*  BILITOT 1.2 1.0 1.0 0.9 0.9  PROT 6.0 6.0 5.9* 5.7* 5.5*  ALBUMIN 2.6* 2.6* 2.7* 2.7* 2.7*   No results found for this basename: LIPASE, AMYLASE,  in the last 168 hours No results found for this basename: AMMONIA,  in the last 168 hours  CBC:  Recent Labs Lab 01/01/13 0420 01/02/13 0400 01/03/13 0335 01/03/13 2004 01/04/13 0420 01/05/13 0400 01/05/13 1700  WBC 23.8* 21.9* 23.2*  --   18.9* 19.0*  --   HGB 9.3* 8.6* 8.7* 9.5* 8.6* 8.4* 8.8*  HCT 28.4* 27.4* 27.5* 28.0* 27.5* 27.0* 26.0*  MCV 89.0 91.0 92.0  --  93.9 93.8  --   PLT 594* 595* 621*  --  611* 579*  --     INR:  Recent Labs Lab 01/02/13 1120 01/03/13 0335 01/03/13 1000 01/04/13 0420 01/05/13 0400  INR 1.93* 2.91* 2.15* 2.05* 2.66*    Other results:  EKG:   Imaging: Dg Chest Port 1 View  01/05/2013   *RADIOLOGY REPORT*  Clinical Data: Follow-up ventricular assist device.  PORTABLE CHEST - 1 VIEW  Comparison: 01/04/2013.  Findings: The pacer wire / AICD is stable and the right subclavian catheter is stable The feeding tube is stable.  The left ventricular assist device is unchanged.  The heart remains enlarged with persistent vascular congestion and areas of perihilar atelectasis.  Small pleural effusions are suspected.  No pneumothorax.  IMPRESSION:  1.  Stable support apparatus. 2.  Persistent perihilar atelectasis, vascular congestion and small left effusion.   Original Report Authenticated By: Rudie Meyer, M.D.   Dg Chest Port 1 View  01/04/2013   *RADIOLOGY REPORT*  Clinical Data: Status post insertion of ventricular assist device.  PORTABLE CHEST - 1 VIEW  Comparison: 01/03/2013  Findings: No pneumothorax or significant pleural fluid identified. Aeration of both lower lobes has improved.  Atelectasis remains bilaterally.  The heart size and mediastinal contours are stable. Positioning of central line and ventricular assist device are stable.  IMPRESSION: Improved aeration bilaterally.   Original Report Authenticated By: Irish Lack, M.D.     Medications:     Scheduled Medications: . amiodarone  200 mg Per NG tube BID  . antiseptic oral rinse  15 mL Mouth Rinse QID  . aspirin  324 mg Per Tube Daily  . aspirin  300 mg Rectal Daily  . chlorhexidine  15 mL Mouth Rinse BID  . citalopram  20 mg Oral Daily  . digoxin  0.125 mg Oral Daily  . feeding supplement  237 mL Oral Q24H  . fluconazole  (DIFLUCAN) IV  200 mg Intravenous Q24H  . insulin aspart  0-24 Units Subcutaneous Q4H  . insulin glargine  12 Units Subcutaneous BID  . magic mouthwash  5 mL Oral QID  . pantoprazole sodium  40 mg Per Tube BID  . potassium chloride  10 mEq Intravenous Q1 Hr x 4  . sildenafil  40 mg Per Tube TID  . sodium chloride  10 mL Intravenous Q12H  . [START ON 01/06/2013] torsemide  20 mg Oral BID  . vancomycin  1,250 mg Intravenous Q24H  . Warfarin - Pharmacist Dosing Inpatient   Does not apply q1800    Infusions: . sodium chloride 20 mL/hr at 01/01/13 0400  . sodium  chloride 20 mL/hr at 01/02/13 0150  . sodium chloride 20 mL/hr (01/04/13 1419)  . sodium chloride 20 mL/hr at 01/05/13 0400  . DOBUTamine 4 mcg/kg/min (01/05/13 0900)  . lactated ringers Stopped (12/28/12 0000)  . norepinephrine (LEVOPHED) Adult infusion      PRN Medications: acetaminophen (TYLENOL) oral liquid 160 mg/5 mL, ALPRAZolam, levalbuterol, ondansetron (ZOFRAN) IV, oxyCODONE-acetaminophen, potassium chloride, sodium chloride, sodium chloride, traMADol   Assessment:  1 status post implantable LVAD with subsequent severe RV dysfunction requiring right ventricular assist device- now removed after 1 week support 2 coagulopathy and bleeding requiring transfusion now improved 3 UGI bleeding- M-W tear, treated 4 Chest  Closure successful 5 Deconditioning- starting to walk with PT Plan/Discussion:   Anticoagulation with  coumadin- now off bival. Goal INR 2.5 Support native RV fx-low-dose, dobutamine,  revatio - weaning nitric as tolerated, dopamine weaned off, continue dobutamine for RV function  Nutrition w/ TF started now  at goal per nutrition- start po diet c/w FEES eval  Sternal incision inspected and is intact and dry  Transition Lasix drip to oral Demadex  PT input, OOB to chair and ambulating  I reviewed the LVAD parameters from today, and compared the results to the patient's prior recorded data.  No  programming changes were made.  The LVAD is functioning within specified parameters.  The patient performs LVAD self-test daily.  LVAD interrogation was negative for any significant power changes, alarms or PI events/speed drops.  LVAD equipment check completed and is in good working order.  Back-up equipment present.   LVAD education done on emergency procedures and precautions and reviewed exit site care.  Length of Stay: 28  [name] III,Brigham Cobbins 01/05/2013, 6:56 PM

## 2013-01-05 NOTE — Progress Notes (Signed)
Physical Therapy Treatment Patient Details Name: Kaitlin [name] MRN: [id number] DOB: 1945/11/07 Today's Date: 01/05/2013 Time: 0826-0904 PT Time Calculation (min): 38 min  PT Assessment / Plan / Recommendation  History of Present Illness Kaitlin [name] is a 67 y/o woman with h/o severe HTN, [name], 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    PT Comments   Patient with decreased activity tolerance compared to prior session. Pt with some reported dizziness in standing, VSS. Attempted further education regarding key elements of daily equipment management. Patient with decreased focus and difficulty problem solving through tasks. Will work with patient this afternoon to address education and management of device. Patient diet changed, will continue to monitor progress with hopes of increasing mobility as incontinence resolves. Will continue to work with patient and progress as tolerated.   Follow Up Recommendations  CIR           Equipment Recommendations   (ROLLATOR WALKER WITH SEAT)    Recommendations for Other Services OT consult;Rehab consult  Frequency Min 5X/week   Progress towards PT Goals Progress towards PT goals: Progressing toward goals  Plan Current plan remains appropriate    Precautions / Restrictions Precautions Precautions: Sternal;Fall Precaution Comments: LVAD   Pertinent Vitals/Pain Pt reports no pain, VSS, on 4 liters O2    Mobility  Bed Mobility Bed Mobility: Rolling Right;Right Sidelying to Sit Rolling Right: 3: Mod assist Right Sidelying  to Sit: 3: Mod assist;HOB elevated Details for Bed Mobility Assistance: vc for sternal precautions. PT with improved bed mobility today Transfers Transfers: Sit to Stand;Stand to Sit Sit to Stand: 3: Mod assist;Without upper extremity assist;From bed Stand to Sit: 3: Mod assist Details for Transfer Assistance: Improved ability today with rocker technique Ambulation/Gait Ambulation/Gait Assistance: 1: +2 Total assist Ambulation/Gait: Patient Percentage: 70% Ambulation Distance (Feet): 6 Feet Assistive device:  (pushing wheel chair) Ambulation/Gait Assistance Details: limited by fatigue, dizziness and liquid stool incontinence    Exercises Other Exercises Other Exercises: AROM cervical extension   PT Diagnosis:    PT Problem List:   PT Treatment Interventions:     PT Goals (current goals can now be found in the care plan section) Acute Rehab PT Goals Patient Stated Goal: get back to work at the [employer] and do the wobble PT Goal Formulation: With patient Time For Goal Achievement: 01/16/13 Potential to Achieve Goals: Good  Visit Information  Last PT Received On: 01/05/13 Assistance Needed: +2 History of Present Illness: Kaitlin [name] is a 67 y/o woman with h/o severe HTN, [name], 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     Subjective Data  Subjective: I feel a little dizzy Patient Stated Goal: get back to work at the [employer] and do the [employer]  Cognition Arousal/Alertness: Awake/alert Behavior During Therapy: Flat affect Overall Cognitive Status:  Within Functional Limits for tasks assessed (no family available )    Balance  Balance Balance Assessed: Yes Static Sitting Balance Static Sitting - Balance Support: No upper extremity supported;Feet supported Static Sitting - Level of Assistance: 4: Min assist Static Sitting - Comment/# of Minutes: 6 minutes at EOB Static Standing Balance Static Standing - Balance Support: Bilateral upper extremity supported;During functional activity Static Standing - Level of Assistance: 3: Mod assist;2: Max assist Static Standing - Comment/# of Minutes: 11 minutes  End of Session PT - End of Session Equipment Utilized During Treatment: Gait belt Activity Tolerance: Patient limited by fatigue Patient left: in chair;with call bell/phone within reach Nurse Communication: Mobility status   GP     Fabio Asa 01/05/2013, 9:18 AM Charlotte Crumb, PT DPT  [phone]

## 2013-01-05 NOTE — Progress Notes (Addendum)
NUTRITION FOLLOW-UP  INTERVENTION: Discontinue tube feeding regimen, pt is no longer receiving. Add Glucerna Shake po daily, each supplement provides 220 kcal and 10 grams of protein. RD to continue to follow nutrition care plan.  NUTRITION DIAGNOSIS: Inadequate oral intake now related to variable appetite as evidenced by variable meal completion.  Goal: Intake to meet >90% of estimated nutrition needs. Unmet.  Monitor:  labs, weight trends, post-op healing and recovery, tolerance of supplements, PO intake  ASSESSMENT: LVAD inserted on 8/18. Pt found to have R ventricular dysfunction s/p LVAD implantation, required trip to OR later that evening for placement of RVAD. RVAD removed 8/25. Underwent sternotomy incision closure and wound VAC wash out 8/26.   Underwent FEES on 9/4. Pt was recommended to have Regular diet with thin liquids, but was started on Dysphagia 3 diet per MD order. Discussed with SLP at bedside.  Pt continues with NGT at this time. Per RN, plan to remove NGT if pt eats "well" for breakfast and lunch. Pt states she has a fair appetite. Voice is quiet and weak.  Height: Ht Readings from Last 1 Encounters:  12/22/12 5\' 9"  (1.753 m)    Weight: Wt Readings from Last 1 Encounters:  01/05/13 181 lb 10.5 oz (82.4 kg)  Admission wt: 165 lb Current wt is up 16 lb.  BMI:  Body mass index is 29.3 kg/(m^2) - using admission weight.  Estimated Nutritional Needs: Kcal: 1800 - 2000 Protein: 90 - 125 gm Fluid: 1.5 - 2 L  Skin:  Abdomen incision Chest incision  Diet Order: Dysphagia 3  EDUCATION NEEDS: -Education needs addressed. Provided many handouts on heart healthy eating, including label reading tips per patient request on 8/15.   Intake/Output Summary (Last 24 hours) at 01/05/13 1056 Last data filed at 01/05/13 1000  Gross per 24 hour  Intake 2911.1 ml  Output   2910 ml  Net    1.1 ml    Last BM: 8/31  Labs:   Recent Labs Lab 01/04/13 0420  01/04/13 1500 01/05/13 0400  NA 146* 143 145  K 3.1* 2.8* 3.4*  CL 101 100 104  CO2 37* 33* 33*  BUN 46* 46* 46*  CREATININE 0.98 1.00 1.02  CALCIUM 9.5 9.5 9.4  GLUCOSE 165* 187* 150*    CBG (last 3)   Recent Labs  01/04/13 2357 01/05/13 0410 01/05/13 0733  GLUCAP 131* 149* 132*   Prealbumin  Date/Time Value Range Status  12/25/2012  3:39 AM 5.9* 17.0 - 34.0 mg/dL Final     Performed at [facility]   Triglycerides  Date/Time Value Range Status  12/25/2012  3:39 AM 159* <150 mg/dL Final    Scheduled Meds: . amiodarone  200 mg Per NG tube BID  . antiseptic oral rinse  15 mL Mouth Rinse QID  . aspirin  324 mg Per Tube Daily  . aspirin  300 mg Rectal Daily  . chlorhexidine  15 mL Mouth Rinse BID  . citalopram  20 mg Oral Daily  . digoxin  0.125 mg Oral Daily  . feeding supplement  60 mL Per Tube BID  . feeding supplement (VITAL 1.5 CAL)  40 mL/hr Per Tube Q24H  . fluconazole (DIFLUCAN) IV  200 mg Intravenous Q24H  . imipenem-cilastatin  500 mg Intravenous Q8H  . insulin aspart  0-24 Units Subcutaneous Q4H  . insulin glargine  18 Units Subcutaneous BID  . magic mouthwash  5 mL Oral QID  . pantoprazole sodium  40 mg Per  Tube BID  . sildenafil  40 mg Per Tube TID  . sodium chloride  10 mL Intravenous Q12H  . torsemide  20 mg Oral BID  . vancomycin  1,250 mg Intravenous Q24H  . warfarin  4 mg Oral ONCE-1800  . Warfarin - Pharmacist Dosing Inpatient   Does not apply q1800    Continuous Infusions: . sodium chloride 20 mL/hr at 01/01/13 0400  . sodium chloride 20 mL/hr at 01/02/13 0150  . sodium chloride 20 mL/hr (01/04/13 1419)  . sodium chloride 20 mL/hr at 01/05/13 0400  . DOBUTamine 4 mcg/kg/min (01/05/13 0900)  . DOPamine 1 mcg/kg/min (01/05/13 0900)  . lactated ringers Stopped (12/28/12 0000)  . norepinephrine (LEVOPHED) Adult infusion      Jarold Motto MS, RD, LDN Pager: [phone] After-hours pager: [phone]

## 2013-01-05 NOTE — Progress Notes (Signed)
  This NP sat at bedside with the patient, active listening and provided space for patient to verbalize thoughts, hopes, fears and concerns regarding her present medical situation.  She "feels better today", remains hopeful for continued improvement and denies any regrets with her decision for LVAD therapy.   Offered education and demonstration for relaxation breathing.  We discussed the power of positive thinking and affirmation.  PMT will continue to support holistically  Lorinda Creed NP  Palliative Medicine Team Team Phone # [phone] Pager [phone].  Total time spend at the beside was 25 min.  Time in 0830 time out 0855, greater than 50% of the time spent on counseling and coordination of care.

## 2013-01-06 ENCOUNTER — Inpatient Hospital Stay (HOSPITAL_COMMUNITY): Payer: Commercial Managed Care - PPO

## 2013-01-06 LAB — COMPREHENSIVE METABOLIC PANEL
ALT: 27 U/L (ref 0–35)
AST: 40 U/L — ABNORMAL HIGH (ref 0–37)
Albumin: 2.7 g/dL — ABNORMAL LOW (ref 3.5–5.2)
Alkaline Phosphatase: 137 U/L — ABNORMAL HIGH (ref 39–117)
BUN: 42 mg/dL — ABNORMAL HIGH (ref 6–23)
CO2: 31 mEq/L (ref 19–32)
Calcium: 9.5 mg/dL (ref 8.4–10.5)
Chloride: 104 mEq/L (ref 96–112)
Creatinine, Ser: 1.16 mg/dL — ABNORMAL HIGH (ref 0.50–1.10)
GFR calc Af Amer: 56 mL/min — ABNORMAL LOW (ref >90)
GFR calc non Af Amer: 48 mL/min — ABNORMAL LOW (ref >90)
Glucose, Bld: 81 mg/dL (ref 70–99)
Potassium: 4 mEq/L (ref 3.5–5.1)
Sodium: 144 mEq/L (ref 135–145)
Total Bilirubin: 0.9 mg/dL (ref 0.3–1.2)
Total Protein: 5.7 g/dL — ABNORMAL LOW (ref 6.0–8.3)

## 2013-01-06 LAB — CBC
HCT: 25.6 % — ABNORMAL LOW (ref 36.0–46.0)
Hemoglobin: 8.1 g/dL — ABNORMAL LOW (ref 12.0–15.0)
MCH: 29.5 pg (ref 26.0–34.0)
MCHC: 31.6 g/dL (ref 30.0–36.0)
MCV: 93.1 fL (ref 78.0–100.0)
Platelets: 533 10*3/uL — ABNORMAL HIGH (ref 150–400)
RBC: 2.75 MIL/uL — ABNORMAL LOW (ref 3.87–5.11)
RDW: 17.7 % — ABNORMAL HIGH (ref 11.5–15.5)
WBC: 18.8 10*3/uL — ABNORMAL HIGH (ref 4.0–10.5)

## 2013-01-06 LAB — CARBOXYHEMOGLOBIN
Carboxyhemoglobin: 1.8 % — ABNORMAL HIGH (ref 0.5–1.5)
Carboxyhemoglobin: 1.8 % — ABNORMAL HIGH (ref 0.5–1.5)
Methemoglobin: 1.3 % (ref 0.0–1.5)
O2 Saturation: 62.7 %
O2 Saturation: 63.4 %
Total hemoglobin: 8.1 g/dL — ABNORMAL LOW (ref 12.0–16.0)

## 2013-01-06 LAB — GLUCOSE, CAPILLARY
Glucose-Capillary: 133 mg/dL — ABNORMAL HIGH (ref 70–99)
Glucose-Capillary: 70 mg/dL (ref 70–99)
Glucose-Capillary: 72 mg/dL (ref 70–99)
Glucose-Capillary: 90 mg/dL (ref 70–99)

## 2013-01-06 LAB — LACTATE DEHYDROGENASE: LDH: 564 U/L — ABNORMAL HIGH (ref 94–250)

## 2013-01-06 LAB — PROTIME-INR
INR: 3.11 — ABNORMAL HIGH (ref 0.00–1.49)
Prothrombin Time: 30.9 seconds — ABNORMAL HIGH (ref 11.6–15.2)

## 2013-01-06 MED ORDER — INSULIN GLARGINE 100 UNIT/ML ~~LOC~~ SOLN
6.0000 [IU] | Freq: Two times a day (BID) | SUBCUTANEOUS | Status: DC
  Administered 2013-01-06 – 2013-01-08 (×5): 6 [IU] via SUBCUTANEOUS
  Filled 2013-01-06 (×6): qty 0.06

## 2013-01-06 MED ORDER — METOLAZONE 2.5 MG PO TABS
2.5000 mg | ORAL_TABLET | Freq: Every day | ORAL | Status: DC
  Administered 2013-01-06 – 2013-01-07 (×2): 2.5 mg via ORAL
  Filled 2013-01-06 (×3): qty 1

## 2013-01-06 MED ORDER — BIOTENE DRY MOUTH MT LIQD
15.0000 mL | Freq: Two times a day (BID) | OROMUCOSAL | Status: DC
  Administered 2013-01-07 – 2013-01-17 (×18): 15 mL via OROMUCOSAL

## 2013-01-06 NOTE — Progress Notes (Signed)
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 EF 15-25% with mild to moderate RV dysfunction  8/18 Underwent HM II LVAD implant (under destination criteria) along with TV repair.  Taken back to the OR for RVAD support.  8/25 was taken back to OR for RVAD explant. 8/26 EGD which showed MW tear in esophagus. Injected and clipped. No further bleeding.  8/27  Back to OR for chest closure.  8/31 extubated  Co-ox was down to 40s yesterday. Nitric restarted. Co-ox 63%. Renal function slightly worse. Weight up 6 pounds. (? Accurate. Sitting up in chair eating breakfast. Feels sleepy.   LVAD INTERROGATION:   HeartMate II LVAD:  Flow 4.3  Liters/min  speed 8600  power 4.0, PI 5.7 No PI events  Objective:    Vital Signs:   Temp:  [97.6 F (36.4 C)-98.4 F (36.9 C)] 97.6 F (36.4 C) (09/06 0753) Pulse Rate:  [75-103] 94 (09/06 0814) Resp:  [14-36] 16 (09/06 0814) SpO2:  [94 %-100 %] 98 % (09/06 0814) Weight:  [85.1 kg (187 lb 9.8 oz)] 85.1 kg (187 lb 9.8 oz) (09/06 0600) Last BM Date: 12/31/12 Mean arterial Pressure 70s  Intake/Output:   Intake/Output Summary (Last 24 hours) at 01/06/13 0919 Last data filed at 01/06/13 0800  Gross per 24 hour  Intake 1890.6 ml  Output   2160 ml  Net -269.4 ml     Physical Exam: General: sitting in chair HEENT: normal Neck: supple. JVP to ear Carotids 2+ bilat; no bruits. No lymphadenopathy or thryomegaly appreciated.  Cor: LVAD hum present.;Lungs: course Abdomen: soft, nontender, mildly distended. Hypoactive BS Driveline: C/D/I; securement device intact  Extremities: no cyanosis, clubbing, rash, 2+ edema.  Neuro: awake follows commands moves all 4 extremities w/o difficulty. Affect pleasant  Telemetry: NSR 90s  Labs: Basic Metabolic Panel:  Recent Labs Lab 01/03/13 0335  01/04/13 0420 01/04/13 1500 01/05/13 0400 01/05/13 1700  01/06/13 0440  NA 147*  < > 146* 143 145 143 144  K 3.0*  < > 3.1* 2.8* 3.4* 3.4* 4.0  CL 100  --  101 100 104  --  104  CO2 38*  --  37* 33* 33*  --  31  GLUCOSE 148*  < > 165* 187* 150* 113* 81  BUN 44*  --  46* 46* 46*  --  42*  CREATININE 0.98  --  0.98 1.00 1.02  --  1.16*  CALCIUM 9.9  --  9.5 9.5 9.4  --  9.5  < > = values in this interval not displayed.  Liver Function Tests:  Recent Labs Lab 01/02/13 0400 01/03/13 0335 01/04/13 0420 01/05/13 0400 01/06/13 0440  AST 29 33 32 34 40*  ALT 27 27 25 27 27   ALKPHOS 164* 154* 143* 136* 137*  BILITOT 1.0 1.0 0.9 0.9 0.9  PROT 6.0 5.9* 5.7* 5.5* 5.7*  ALBUMIN 2.6* 2.7* 2.7* 2.7* 2.7*   No results found for this basename: LIPASE, AMYLASE,  in the last 168 hours No results found for this basename: AMMONIA,  in the last 168 hours  CBC:  Recent Labs Lab 01/02/13 0400 01/03/13 0335 01/03/13 2004 01/04/13 0420 01/05/13 0400 01/05/13 1700 01/06/13 0440  WBC 21.9* 23.2*  --  18.9* 19.0*  --  18.8*  HGB 8.6* 8.7* 9.5* 8.6* 8.4* 8.8* 8.1*  HCT 27.4* 27.5* 28.0* 27.5* 27.0* 26.0* 25.6*  MCV 91.0 92.0  --  93.9 93.8  --  93.1  PLT 595* 621*  --  611* 579*  --  533*    INR:  Recent Labs Lab 01/03/13 0335 01/03/13 1000 01/04/13 0420 01/05/13 0400 01/06/13 0440  INR 2.91* 2.15* 2.05* 2.66* 3.11*    Imaging: Dg Chest Port 1 View  01/05/2013   *RADIOLOGY REPORT*  Clinical Data: Follow-up ventricular assist device.  PORTABLE CHEST - 1 VIEW  Comparison: 01/04/2013.  Findings: The pacer wire / AICD is stable and the right subclavian catheter is stable The feeding tube is stable.  The left ventricular assist device is unchanged.  The heart remains enlarged with persistent vascular congestion and areas of perihilar atelectasis.  Small pleural effusions are suspected.  No pneumothorax.  IMPRESSION:  1.  Stable support apparatus. 2.  Persistent perihilar atelectasis, vascular congestion and small left effusion.   Original Report  Authenticated By: Rudie Meyer, M.D.     Medications:     Scheduled Medications: . amiodarone  200 mg Per NG tube BID  . antiseptic oral rinse  15 mL Mouth Rinse QID  . aspirin  324 mg Per Tube Daily  . aspirin  300 mg Rectal Daily  . chlorhexidine  15 mL Mouth Rinse BID  . citalopram  20 mg Oral Daily  . digoxin  0.125 mg Oral Daily  . feeding supplement  237 mL Oral Q24H  . fluconazole (DIFLUCAN) IV  200 mg Intravenous Q24H  . insulin aspart  0-24 Units Subcutaneous Q4H  . insulin glargine  12 Units Subcutaneous BID  . magic mouthwash  5 mL Oral QID  . pantoprazole sodium  40 mg Per Tube BID  . sildenafil  40 mg Per Tube TID  . sodium chloride  10 mL Intravenous Q12H  . torsemide  20 mg Oral BID  . vancomycin  1,250 mg Intravenous Q24H  . Warfarin - Pharmacist Dosing Inpatient   Does not apply q1800    Infusions: . sodium chloride 20 mL/hr at 01/01/13 0400  . sodium chloride 20 mL/hr at 01/02/13 0150  . sodium chloride 20 mL/hr (01/04/13 1419)  . sodium chloride 16 mL/hr at 01/06/13 0600  . DOBUTamine 4 mcg/kg/min (01/06/13 0800)  . lactated ringers Stopped (12/28/12 0000)  . norepinephrine (LEVOPHED) Adult infusion      PRN Medications: acetaminophen (TYLENOL) oral liquid 160 mg/5 mL, ALPRAZolam, levalbuterol, ondansetron (ZOFRAN) IV, oxyCODONE-acetaminophen, potassium chloride, sodium chloride, sodium chloride, traMADol   Assessment:   1. Acute on chronic systolic HF with biventricular HF EF 10%  --S/p HM II VAD with TV repair 12/18/12  --s/p RVAD. - explanted 8/25 2. 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 Cardiogenic shock  4. VT s/p Biotronik ICD on amio 5. Chronic renal failure, stage III 6. 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 ABL anemia  8. PNA 9. Severe protein calorie malnutrition - on TPN 10. Hyponatremia/Hypokalemia 11. 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 Acute respiratory failure  Plan/Discussion:     Continues to improve. Still edematous and weight up. Creatinine rising slowly. Continue demadex. Give dose of metolazone. Place TED hose. Wean nitric to 5. Recheck co-ox at [facility].   WBC down slightly. On broad spectrum antibiotics and fluconazole.    INR therapeutic. Pharmacy to dose coumadin.   [name] 9:19 AM Length of Stay: 29 VAD Team Pager [phone] (7am - 7am) VAD Issue Non-VAD issues HF Pager [phone]

## 2013-01-06 NOTE — Progress Notes (Signed)
ANTICOAGULATION CONSULT NOTE - Follow Up Consult  Pharmacy Consult for Coumadin Indication: LVAD  No Known Allergies  Patient Measurements: Height: 5\' 9"  (175.3 cm) Weight: 187 lb 9.8 oz (85.1 kg) IBW/kg (Calculated) : 66.2  Vital Signs: Temp: 97.6 F (36.4 C) (09/06 0753) Temp src: Oral (09/06 0753) Pulse Rate: 98 (09/06 1010)  Labs:  Recent Labs  01/04/13 0420 01/04/13 1500 01/05/13 0400 01/05/13 1700 01/06/13 0440  HGB 8.6*  --  8.4* 8.8* 8.1*  HCT 27.5*  --  27.0* 26.0* 25.6*  PLT 611*  --  579*  --  533*  LABPROT 22.5*  --  27.4*  --  30.9*  INR 2.05*  --  2.66*  --  3.11*  CREATININE 0.98 1.00 1.02  --  1.16*    Estimated Creatinine Clearance: 56.3 ml/min (by C-G formula based on Cr of 1.16).  Assessment: 67yof s/p LVAD and tricuspid valve repair on 8/18. Coumadin dosing changed to per pharmacy on 9/5. INR has continued to trend up and is now supratherapeutic, likely due to effects of fluconazole which was added 9/3. Hgb has dropped slightly but no bleeding reported.  Goal of Therapy:  INR 2-2.5 Monitor platelets by anticoagulation protocol: Yes   Plan:  1) No coumadin tonight 2) INR in AM  Fredrik Rigger 01/06/2013,10:21 AM

## 2013-01-06 NOTE — Progress Notes (Signed)
Hypoglycemic Event  CBG: 68  Treatment: 4oz orange juice  Symptoms: none  Follow-up CBG: [id number] CBG Result:90  Possible Reasons for Event: change from tube feeding to po intake.   Dr Laneta Simmers notified and Dr. Gala Romney during rounds at 1015am       Kaitlin [name]  Remember to initiate Hypoglycemia Order Set & complete

## 2013-01-07 ENCOUNTER — Encounter (HOSPITAL_COMMUNITY): Payer: Self-pay | Admitting: Anesthesiology

## 2013-01-07 LAB — GLUCOSE, CAPILLARY: Glucose-Capillary: 76 mg/dL (ref 70–99)

## 2013-01-07 LAB — COMPREHENSIVE METABOLIC PANEL
ALT: 25 U/L (ref 0–35)
AST: 36 U/L (ref 0–37)
Albumin: 2.6 g/dL — ABNORMAL LOW (ref 3.5–5.2)
Alkaline Phosphatase: 125 U/L — ABNORMAL HIGH (ref 39–117)
BUN: 38 mg/dL — ABNORMAL HIGH (ref 6–23)
CO2: 32 mEq/L (ref 19–32)
Calcium: 9.1 mg/dL (ref 8.4–10.5)
Chloride: 100 mEq/L (ref 96–112)
Creatinine, Ser: 1.23 mg/dL — ABNORMAL HIGH (ref 0.50–1.10)
GFR calc Af Amer: 52 mL/min — ABNORMAL LOW (ref >90)
GFR calc non Af Amer: 45 mL/min — ABNORMAL LOW (ref >90)
Glucose, Bld: 86 mg/dL (ref 70–99)
Potassium: 2.9 mEq/L — ABNORMAL LOW (ref 3.5–5.1)
Sodium: 141 mEq/L (ref 135–145)
Total Bilirubin: 0.9 mg/dL (ref 0.3–1.2)
Total Protein: 5.5 g/dL — ABNORMAL LOW (ref 6.0–8.3)

## 2013-01-07 LAB — CBC
HCT: 25.5 % — ABNORMAL LOW (ref 36.0–46.0)
Hemoglobin: 8.1 g/dL — ABNORMAL LOW (ref 12.0–15.0)
MCH: 29.3 pg (ref 26.0–34.0)
MCHC: 31.8 g/dL (ref 30.0–36.0)
MCV: 92.4 fL (ref 78.0–100.0)
Platelets: 493 10*3/uL — ABNORMAL HIGH (ref 150–400)
RBC: 2.76 MIL/uL — ABNORMAL LOW (ref 3.87–5.11)
RDW: 17.4 % — ABNORMAL HIGH (ref 11.5–15.5)
WBC: 16.8 10*3/uL — ABNORMAL HIGH (ref 4.0–10.5)

## 2013-01-07 LAB — LACTATE DEHYDROGENASE: LDH: 509 U/L — ABNORMAL HIGH (ref 94–250)

## 2013-01-07 LAB — PROTIME-INR
INR: 2.93 — ABNORMAL HIGH (ref 0.00–1.49)
Prothrombin Time: 29.5 seconds — ABNORMAL HIGH (ref 11.6–15.2)

## 2013-01-07 LAB — CARBOXYHEMOGLOBIN
Methemoglobin: 0.8 % (ref 0.0–1.5)
O2 Saturation: 54.3 %
Total hemoglobin: 8.3 g/dL — ABNORMAL LOW (ref 12.0–16.0)

## 2013-01-07 LAB — POTASSIUM: Potassium: 3.1 mEq/L — ABNORMAL LOW (ref 3.5–5.1)

## 2013-01-07 MED ORDER — WARFARIN SODIUM 1 MG PO TABS
1.0000 mg | ORAL_TABLET | Freq: Once | ORAL | Status: AC
  Administered 2013-01-07: 1 mg via ORAL
  Filled 2013-01-07: qty 1

## 2013-01-07 MED ORDER — PANTOPRAZOLE SODIUM 40 MG PO TBEC
40.0000 mg | DELAYED_RELEASE_TABLET | Freq: Two times a day (BID) | ORAL | Status: DC
  Administered 2013-01-07 – 2013-01-18 (×22): 40 mg via ORAL
  Filled 2013-01-07 (×20): qty 1

## 2013-01-07 MED ORDER — POTASSIUM CHLORIDE 10 MEQ/50ML IV SOLN
10.0000 meq | INTRAVENOUS | Status: AC
  Administered 2013-01-07 (×3): 10 meq via INTRAVENOUS

## 2013-01-07 MED ORDER — POTASSIUM CHLORIDE CRYS ER 20 MEQ PO TBCR
40.0000 meq | EXTENDED_RELEASE_TABLET | Freq: Once | ORAL | Status: AC
  Administered 2013-01-07: 40 meq via ORAL
  Filled 2013-01-07: qty 2

## 2013-01-07 MED ORDER — POTASSIUM CHLORIDE 10 MEQ/50ML IV SOLN
10.0000 meq | INTRAVENOUS | Status: AC
  Administered 2013-01-07 – 2013-01-08 (×3): 10 meq via INTRAVENOUS

## 2013-01-07 MED ORDER — SILDENAFIL CITRATE 20 MG PO TABS
60.0000 mg | ORAL_TABLET | Freq: Three times a day (TID) | ORAL | Status: DC
  Administered 2013-01-07 (×2): 60 mg
  Filled 2013-01-07 (×5): qty 3

## 2013-01-07 NOTE — Progress Notes (Addendum)
Patient ID: Kaitlin [name], female   DOB: 1945-09-29, 67 y.o.   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 EF 15-25% with mild to moderate RV dysfunction  8/18 Underwent HM II LVAD implant (under destination criteria) along with TV repair.  Taken back to the OR for RVAD support.  8/25 was taken back to OR for RVAD explant. 8/26 EGD which showed MW tear in esophagus. Injected and clipped. No further bleeding.  8/27  Back to OR for chest closure.  8/31 extubated  NO weaned to 5 ppm, stable Co-ox 61%. Renal fxn stable. Reasonable diuresis with I/O net negative 1794 yesterday on current diuretic regimen.  CVP remains 15.  Hemoglobin and LDH stable.    LVAD INTERROGATION:   HeartMate II LVAD:  Flow 4.2  Liters/min  speed 8600  power 4.2, PI 5.9.  No PI events  Objective:    Vital Signs:   Temp:  [97.3 F (36.3 C)-98.5 F (36.9 C)] 98.2 F (36.8 C) (09/07 0758) Pulse Rate:  [36-106] 90 (09/07 0900) Resp:  [9-29] 19 (09/07 0900) SpO2:  [93 %-100 %] 99 % (09/07 0902) Weight:  [85.1 kg (187 lb 9.8 oz)] 85.1 kg (187 lb 9.8 oz) (09/07 0500) Last BM Date: 01/06/13 Mean arterial Pressure 80s  Intake/Output:   Intake/Output Summary (Last 24 hours) at 01/07/13 0942 Last data filed at 01/07/13 0900  Gross per 24 hour  Intake 2214.4 ml  Output   4465 ml  Net -2250.6 ml     Physical Exam: General: sitting in chair HEENT: normal Neck: supple. JVP 10 cm.  Carotids 2+ bilat; no bruits. No lymphadenopathy or thryomegaly appreciated.  Cor: LVAD hum present.;Lungs: course Abdomen: soft, nontender, mildly distended. Hypoactive BS Driveline: C/D/I; securement device intact  Extremities: no cyanosis, clubbing, rash, 2+ edema.  Neuro: awake follows commands moves all 4 extremities w/o difficulty. Affect pleasant  Telemetry: NSR 90s  Labs: Basic Metabolic  Panel:  Recent Labs Lab 01/04/13 0420 01/04/13 1500 01/05/13 0400 01/05/13 1700 01/06/13 0440 01/07/13 0416  NA 146* 143 145 143 144 141  K 3.1* 2.8* 3.4* 3.4* 4.0 2.9*  CL 101 100 104  --  104 100  CO2 37* 33* 33*  --  31 32  GLUCOSE 165* 187* 150* 113* 81 86  BUN 46* 46* 46*  --  42* 38*  CREATININE 0.98 1.00 1.02  --  1.16* 1.23*  CALCIUM 9.5 9.5 9.4  --  9.5 9.1    Liver Function Tests:  Recent Labs Lab 01/03/13 0335 01/04/13 0420 01/05/13 0400 01/06/13 0440 01/07/13 0416  AST 33 32 34 40* 36  ALT 27 25 27 27 25   ALKPHOS 154* 143* 136* 137* 125*  BILITOT 1.0 0.9 0.9 0.9 0.9  PROT 5.9* 5.7* 5.5* 5.7* 5.5*  ALBUMIN 2.7* 2.7* 2.7* 2.7* 2.6*   No results found for this basename: LIPASE, AMYLASE,  in the last 168 hours No results found for this basename: AMMONIA,  in the last 168 hours  CBC:  Recent Labs Lab 01/03/13 0335  01/04/13 0420 01/05/13 0400 01/05/13 1700 01/06/13 0440 01/07/13 0416  WBC 23.2*  --  18.9* 19.0*  --  18.8* 16.8*  HGB 8.7*  < > 8.6* 8.4* 8.8* 8.1* 8.1*  HCT 27.5*  < > 27.5* 27.0* 26.0* 25.6* 25.5*  MCV 92.0  --  93.9  93.8  --  93.1 92.4  PLT 621*  --  611* 579*  --  533* 493*  < > = values in this interval not displayed.  INR:  Recent Labs Lab 01/03/13 1000 01/04/13 0420 01/05/13 0400 01/06/13 0440 01/07/13 0416  INR 2.15* 2.05* 2.66* 3.11* 2.93*    Imaging: Dg Chest Port 1 View  01/06/2013   *RADIOLOGY REPORT*  Clinical Data: Post LVAD insertion  PORTABLE CHEST - 1 VIEW  Comparison: 01/05/2013; 01/04/2013; 01/03/2013  Findings:  Grossly unchanged enlarged cardiac silhouette and mediastinal contours post median sternotomy. Interval removal of enteric tube. Otherwise, stable positioning of remaining support apparatus.  The pulmonary vasculature remains is seen with cephalization of flow. Worsening right-sided volume loss with associated right mid lung linear heterogeneous / consolidative opacities.  No definite pneumothorax.  Unchanged trace bilateral effusions, right greater than left.  Unchanged bones.  IMPRESSION: 1.  Interval removal of enteric tube.  Otherwise, stable positions remain support apparatus.  No definite pneumothorax. 2.  Persistent findings of mild pulmonary edema with suspected trace bilateral effusions.  3.  Worsening right mid lung atelectasis with associated volume loss.   Original Report Authenticated By: Tacey Ruiz, MD     Medications:     Scheduled Medications: . amiodarone  200 mg Per NG tube BID  . antiseptic oral rinse  15 mL Mouth Rinse BID  . aspirin  324 mg Per Tube Daily  . aspirin  300 mg Rectal Daily  . citalopram  20 mg Oral Daily  . digoxin  0.125 mg Oral Daily  . feeding supplement  237 mL Oral Q24H  . fluconazole (DIFLUCAN) IV  200 mg Intravenous Q24H  . insulin aspart  0-24 Units Subcutaneous Q4H  . insulin glargine  6 Units Subcutaneous BID  . magic mouthwash  5 mL Oral QID  . metolazone  2.5 mg Oral Daily  . pantoprazole  40 mg Oral BID  . potassium chloride  10 mEq Intravenous Q1 Hr x 3  . potassium chloride  40 mEq Oral Once  . sildenafil  60 mg Per Tube TID  . sodium chloride  10 mL Intravenous Q12H  . torsemide  20 mg Oral BID  . vancomycin  1,250 mg Intravenous Q24H  . Warfarin - Pharmacist Dosing Inpatient   Does not apply q1800    Infusions: . sodium chloride 20 mL/hr at 01/01/13 0400  . sodium chloride 16 mL/hr at 01/06/13 2248  . sodium chloride 20 mL/hr (01/04/13 1419)  . sodium chloride Stopped (01/06/13 2240)  . DOBUTamine 4 mcg/kg/min (01/07/13 0900)  . lactated ringers Stopped (12/28/12 0000)  . norepinephrine (LEVOPHED) Adult infusion      PRN Medications: acetaminophen (TYLENOL) oral liquid 160 mg/5 mL, ALPRAZolam, levalbuterol, ondansetron (ZOFRAN) IV, oxyCODONE-acetaminophen, potassium chloride, sodium chloride, sodium chloride, traMADol   Assessment:   1. Acute on chronic systolic HF with biventricular HF EF 10%  --S/p HM II VAD  with TV repair 12/18/12  --s/p RVAD. - explanted 8/25 2. 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 Cardiogenic shock  4. VT s/p Biotronik ICD on amio 5. Chronic renal failure, stage III 6. 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 ABL anemia  8. PNA 9. Severe protein calorie malnutrition - on TPN 10. Hyponatremia/Hypokalemia 11. 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 Acute respiratory failure  Plan/Discussion:    Stable.  Will increase sildenafil to 60 mg tid and try to wean NO to off  today.  Co-ox stable with NO wean yesterday.  Will get co-ox this pm after NO off.  Continue dobutamine gtt.   MAPs creeping up in 80s now.   Still volume overloaded, CVP 15.  Diuresed reasonably yesterday, will continue current regimen and replace K (metolazone + torsemide).  Renal function stable but up from baseline, follow carefully.   Mobilize.   PNA; Afebrile, WBCs falling.  On fluconazole and vancomycin.    INR therapeutic. Pharmacy to dose coumadin.  On ASA.  LDH and hemoglobin stable.   Kataleena Holsapple,MD 9:42 AM Length of Stay: 30 VAD Team Pager [phone] (7am - 7am) VAD Issue Non-VAD issues HF Pager [phone]

## 2013-01-07 NOTE — Progress Notes (Signed)
ANTICOAGULATION CONSULT NOTE - Follow Up Consult  Pharmacy Consult for Coumadin Indication: LVAD  No Known Allergies  Patient Measurements: Height: 5\' 9"  (175.3 cm) Weight: 187 lb 9.8 oz (85.1 kg) IBW/kg (Calculated) : 66.2  Vital Signs: Temp: 98.2 F (36.8 C) (09/07 0758) Temp src: Oral (09/07 0758) Pulse Rate: 90 (09/07 0900)  Labs:  Recent Labs  01/05/13 0400 01/05/13 1700 01/06/13 0440 01/07/13 0416  HGB 8.4* 8.8* 8.1* 8.1*  HCT 27.0* 26.0* 25.6* 25.5*  PLT 579*  --  533* 493*  LABPROT 27.4*  --  30.9* 29.5*  INR 2.66*  --  3.11* 2.93*  CREATININE 1.02  --  1.16* 1.23*    Estimated Creatinine Clearance: 53.1 ml/min (by C-G formula based on Cr of 1.23).  Assessment: 67yof s/p LVAD and tricuspid valve repair on 8/18. Coumadin dosing changed to per pharmacy on 9/5. INR was trending up likely due to effects of fluconazole which was added 9/3. Dose held last night and now INR has decreased to 2.93. 2 doses of fluconazole to go. Will give a small dose tonight. Hgb is low but stable. No bleeding reported.  Goal of Therapy:  INR 2-2.5 Monitor platelets by anticoagulation protocol: Yes   Plan:  1) Coumadin 1mg  x 1 tonight 2) INR in AM  Fredrik Rigger 01/07/2013,10:28 AM

## 2013-01-07 NOTE — Progress Notes (Signed)
**Note De-Identified Pema Thomure Obfuscation** RT note: patient removed from NITRIC.  SAT 96% on 4 LNC.

## 2013-01-07 NOTE — Progress Notes (Signed)
Patient ID: ANEESHA HOLLORAN, female   DOB: 11/24/1945, 67 y.o.   MRN: [id number]  HeartMate 2 Rounding Note  Subjective:   Ms. 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 EF 15-25% with mild to moderate RV dysfunction  8/18 Underwent HM II LVAD implant (under destination criteria) along with TV repair. Taken back to the OR for RVAD support.  8/25 was taken back to OR for RVAD explant.  8/26 EGD which showed MW tear in esophagus. Injected and clipped. No further bleeding.  8/27 Back to OR for chest closure.  8/31 extubated  NO weaned to 5 ppm, stable Co-ox 61%. Renal fxn stable. Good diuresis with -2060 cc yesterday. CVP 15.  LVAD INTERROGATION:  HeartMate II LVAD: Flow 4.2 Liters/min speed 8600 power 4.2, PI 5.9. No PI events  Objective:   Vital Signs:  Temp: [97.3 F (36.3 C)-98.5 F (36.9 C)] 98.2 F (36.8 C) (09/07 0758)  Pulse Rate: [36-106] 90 (09/07 0900)  Resp: [9-29] 19 (09/07 0900)  SpO2: [93 %-100 %] 99 % (09/07 0902)  Weight: [85.1 kg (187 lb 9.8 oz)] 85.1 kg (187 lb 9.8 oz) (09/07 0500)  Last BM Date: 01/06/13  Mean arterial Pressure 80s  Intake/Output:   Intake/Output Summary (Last 24 hours) at 01/07/13 0942 Last data filed at 01/07/13 0900   Gross per 24 hour   Intake  2214.4 ml   Output  4465 ml   Net  -2250.6 ml    Physical Exam:  General: in bed. She walked just outside her room today and then sat up in chair for a while. Complains of headache HEENT: normal  Cor: LVAD hum present.;Lungs: clear Abdomen: soft, nontender, mildly distended. + BS Driveline: C/D/I; securement device intact  Extremities: no cyanosis, clubbing, rash, 2+ edema.  Neuro: awake follows commands. Grossly intact. Affect pleasant  Telemetry: NSR 90s  Labs:  Basic Metabolic Panel:   Recent Labs  Lab  01/04/13 0420  01/04/13 1500  01/05/13 0400  01/05/13 1700  01/06/13 0440  01/07/13 0416   NA  146*  143  145  143   144  141   K  3.1*  2.8*  3.4*  3.4*  4.0  2.9*   CL  101  100  104  --  104  100   CO2  37*  33*  33*  --  31  32   GLUCOSE  165*  187*  150*  113*  81  86   BUN  46*  46*  46*  --  42*  38*   CREATININE  0.98  1.00  1.02  --  1.16*  1.23*   CALCIUM  9.5  9.5  9.4  --  9.5  9.1    Liver Function Tests:   Recent Labs  Lab  01/03/13 0335  01/04/13 0420  01/05/13 0400  01/06/13 0440  01/07/13 0416   AST  33  32  34  40*  36   ALT  27  25  27  27  25    ALKPHOS  154*  143*  136*  137*  125*   BILITOT  1.0  0.9  0.9  0.9  0.9   PROT  5.9*  5.7*  5.5*  5.7*  5.5*   ALBUMIN  2.7*  2.7*  2.7*  2.7*  2.6*    No results found for this basename: LIPASE, AMYLASE, in  the last 168 hours  No results found for this basename: AMMONIA, in the last 168 hours  CBC:   Recent Labs  Lab  01/03/13 0335   01/04/13 0420  01/05/13 0400  01/05/13 1700  01/06/13 0440  01/07/13 0416   WBC  23.2*  --  18.9*  19.0*  --  18.8*  16.8*   HGB  8.7*  < >  8.6*  8.4*  8.8*  8.1*  8.1*   HCT  27.5*  < >  27.5*  27.0*  26.0*  25.6*  25.5*   MCV  92.0  --  93.9  93.8  --  93.1  92.4   PLT  621*  --  611*  579*  --  533*  493*   < > = values in this interval not displayed.  INR:   Recent Labs  Lab  01/03/13 1000  01/04/13 0420  01/05/13 0400  01/06/13 0440  01/07/13 0416   INR  2.15*  2.05*  2.66*  3.11*  2.93*    Imaging:  Dg Chest Port 1 View  01/06/2013 *RADIOLOGY REPORT* Clinical Data: Post LVAD insertion PORTABLE CHEST - 1 VIEW Comparison: 01/05/2013; 01/04/2013; 01/03/2013 Findings: Grossly unchanged enlarged cardiac silhouette and mediastinal contours post median sternotomy. Interval removal of enteric tube. Otherwise, stable positioning of remaining support apparatus. The pulmonary vasculature remains is seen with cephalization of flow. Worsening right-sided volume loss with associated right mid lung linear heterogeneous / consolidative opacities. No definite pneumothorax. Unchanged trace  bilateral effusions, right greater than left. Unchanged bones. IMPRESSION: 1. Interval removal of enteric tube. Otherwise, stable positions remain support apparatus. No definite pneumothorax. 2. Persistent findings of mild pulmonary edema with suspected trace bilateral effusions. 3. Worsening right mid lung atelectasis with associated volume loss. Original Report Authenticated By: Tacey Ruiz, MD    Medications:   Scheduled Medications:  .  amiodarone  200 mg  Per NG tube  BID   .  antiseptic oral rinse  15 mL  Mouth Rinse  BID   .  aspirin  324 mg  Per Tube  Daily   .  aspirin  300 mg  Rectal  Daily   .  citalopram  20 mg  Oral  Daily   .  digoxin  0.125 mg  Oral  Daily   .  feeding supplement  237 mL  Oral  Q24H   .  fluconazole (DIFLUCAN) IV  200 mg  Intravenous  Q24H   .  insulin aspart  0-24 Units  Subcutaneous  Q4H   .  insulin glargine  6 Units  Subcutaneous  BID   .  magic mouthwash  5 mL  Oral  QID   .  metolazone  2.5 mg  Oral  Daily   .  pantoprazole  40 mg  Oral  BID   .  potassium chloride  10 mEq  Intravenous  Q1 Hr x 3   .  potassium chloride  40 mEq  Oral  Once   .  sildenafil  60 mg  Per Tube  TID   .  sodium chloride  10 mL  Intravenous  Q12H   .  torsemide  20 mg  Oral  BID   .  vancomycin  1,250 mg  Intravenous  Q24H   .  Warfarin - Pharmacist Dosing Inpatient   Does not apply  q1800    Infusions:  .  sodium chloride  20 mL/hr at 01/01/13 0400   .  sodium chloride  16 mL/hr at 01/06/13 2248   .  sodium chloride  20 mL/hr (01/04/13 1419)   .  sodium chloride  Stopped (01/06/13 2240)   .  DOBUTamine  4 mcg/kg/min (01/07/13 0900)   .  lactated ringers  Stopped (12/28/12 0000)   .  norepinephrine (LEVOPHED) Adult infusion     PRN Medications:  acetaminophen (TYLENOL) oral liquid 160 mg/5 mL, ALPRAZolam, levalbuterol, ondansetron (ZOFRAN) IV, oxyCODONE-acetaminophen, potassium chloride, sodium chloride, sodium chloride, traMADol Assessment:   1. Acute on chronic  systolic HF with biventricular HF EF 10%  --S/p HM II VAD with TV repair 12/18/12  --s/p RVAD. - explanted 8/25  2. 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 Cardiogenic shock  4. VT s/p Biotronik ICD on amio  5. Chronic renal failure, stage III  6. 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 ABL anemia: Hgb stable 8. PNA  9. Severe protein calorie malnutrition - on TPN  10. Hyponatremia/Hypokalemia  11. GI bleeding with [place] Artist tear resolved 12. Acute respiratory failure. Doing well since extubation. 13. LDH stable Plan/Discussion:   Stable. Plan to wean NO slowly today and check COOx this afternoon. Continue dobutamine at 4. Still volume overloaded and edematous but some of this is due to low albumin and will improve as nutritional state improves. CVP 15 which is reasonable. Diuresed reasonably yesterday, will continue current regimen and replace K. Renal function stable but up from baseline, follow carefully.  Mobilize.  PNA; Afebrile, WBCs falling. On fluconazole and vancomycin.  INR therapeutic. Pharmacy to dose coumadin. On ASA. LDH and hemoglobin stable.

## 2013-01-08 DIAGNOSIS — I509 Heart failure, unspecified: Secondary | ICD-10-CM

## 2013-01-08 DIAGNOSIS — Z95818 Presence of other cardiac implants and grafts: Secondary | ICD-10-CM

## 2013-01-08 DIAGNOSIS — I5022 Chronic systolic (congestive) heart failure: Secondary | ICD-10-CM

## 2013-01-08 DIAGNOSIS — R5381 Other malaise: Secondary | ICD-10-CM

## 2013-01-08 DIAGNOSIS — I428 Other cardiomyopathies: Secondary | ICD-10-CM

## 2013-01-08 LAB — CARBOXYHEMOGLOBIN
Carboxyhemoglobin: 1.8 % — ABNORMAL HIGH (ref 0.5–1.5)
Carboxyhemoglobin: 1.7 % — ABNORMAL HIGH (ref 0.5–1.5)
Methemoglobin: 1.3 % (ref 0.0–1.5)
Methemoglobin: 0.8 % (ref 0.0–1.5)
O2 Saturation: 61 %
O2 Saturation: 51.5 %
Total hemoglobin: 9 g/dL — ABNORMAL LOW (ref 12.0–16.0)
Total hemoglobin: 8.1 g/dL — ABNORMAL LOW (ref 12.0–16.0)

## 2013-01-08 LAB — CBC
HCT: 27.5 % — ABNORMAL LOW (ref 36.0–46.0)
Hemoglobin: 8.5 g/dL — ABNORMAL LOW (ref 12.0–15.0)
MCH: 28.5 pg (ref 26.0–34.0)
MCHC: 30.9 g/dL (ref 30.0–36.0)
MCV: 92.3 fL (ref 78.0–100.0)
Platelets: 487 K/uL — ABNORMAL HIGH (ref 150–400)
RBC: 2.98 MIL/uL — ABNORMAL LOW (ref 3.87–5.11)
RDW: 17.1 % — ABNORMAL HIGH (ref 11.5–15.5)
WBC: 16.4 K/uL — ABNORMAL HIGH (ref 4.0–10.5)

## 2013-01-08 LAB — COMPREHENSIVE METABOLIC PANEL
ALT: 27 U/L (ref 0–35)
AST: 42 U/L — ABNORMAL HIGH (ref 0–37)
Albumin: 2.7 g/dL — ABNORMAL LOW (ref 3.5–5.2)
Alkaline Phosphatase: 131 U/L — ABNORMAL HIGH (ref 39–117)
BUN: 35 mg/dL — ABNORMAL HIGH (ref 6–23)
CO2: 33 mEq/L — ABNORMAL HIGH (ref 19–32)
Calcium: 9.3 mg/dL (ref 8.4–10.5)
Chloride: 93 mEq/L — ABNORMAL LOW (ref 96–112)
Creatinine, Ser: 1.2 mg/dL — ABNORMAL HIGH (ref 0.50–1.10)
GFR calc Af Amer: 54 mL/min — ABNORMAL LOW (ref >90)
GFR calc non Af Amer: 46 mL/min — ABNORMAL LOW (ref >90)
Glucose, Bld: 97 mg/dL (ref 70–99)
Potassium: 3.7 mEq/L (ref 3.5–5.1)
Sodium: 135 mEq/L (ref 135–145)
Total Bilirubin: 1 mg/dL (ref 0.3–1.2)
Total Protein: 5.9 g/dL — ABNORMAL LOW (ref 6.0–8.3)

## 2013-01-08 LAB — PROTIME-INR
INR: 3.5 — ABNORMAL HIGH (ref 0.00–1.49)
Prothrombin Time: 33.8 seconds — ABNORMAL HIGH (ref 11.6–15.2)

## 2013-01-08 LAB — GLUCOSE, CAPILLARY
Glucose-Capillary: 100 mg/dL — ABNORMAL HIGH (ref 70–99)
Glucose-Capillary: 113 mg/dL — ABNORMAL HIGH (ref 70–99)
Glucose-Capillary: 171 mg/dL — ABNORMAL HIGH (ref 70–99)
Glucose-Capillary: 77 mg/dL (ref 70–99)
Glucose-Capillary: 77 mg/dL (ref 70–99)
Glucose-Capillary: 78 mg/dL (ref 70–99)

## 2013-01-08 LAB — LACTATE DEHYDROGENASE: LDH: 540 U/L — ABNORMAL HIGH (ref 94–250)

## 2013-01-08 MED ORDER — FLUCONAZOLE IN SODIUM CHLORIDE 200-0.9 MG/100ML-% IV SOLN
200.0000 mg | INTRAVENOUS | Status: AC
  Administered 2013-01-08 – 2013-01-09 (×2): 200 mg via INTRAVENOUS
  Filled 2013-01-08 (×2): qty 100

## 2013-01-08 MED ORDER — INSULIN GLARGINE 100 UNIT/ML ~~LOC~~ SOLN
12.0000 [IU] | Freq: Every day | SUBCUTANEOUS | Status: DC
  Administered 2013-01-09 – 2013-01-14 (×6): 12 [IU] via SUBCUTANEOUS
  Filled 2013-01-08 (×7): qty 0.12

## 2013-01-08 MED ORDER — ENSURE PUDDING PO PUDG
1.0000 | Freq: Three times a day (TID) | ORAL | Status: DC
  Administered 2013-01-08 – 2013-01-13 (×4): 1 via ORAL

## 2013-01-08 MED ORDER — SILDENAFIL CITRATE 20 MG PO TABS
40.0000 mg | ORAL_TABLET | Freq: Three times a day (TID) | ORAL | Status: DC
  Administered 2013-01-08 – 2013-01-18 (×31): 40 mg
  Filled 2013-01-08 (×33): qty 2

## 2013-01-08 MED ORDER — SODIUM CHLORIDE 0.9 % IV SOLN
250.0000 mL | INTRAVENOUS | Status: DC
  Administered 2013-01-08: 250 mL via INTRAVENOUS

## 2013-01-08 MED ORDER — SPIRONOLACTONE 12.5 MG HALF TABLET
12.5000 mg | ORAL_TABLET | Freq: Every day | ORAL | Status: DC
  Administered 2013-01-08 – 2013-01-10 (×3): 12.5 mg via ORAL
  Filled 2013-01-08 (×3): qty 1

## 2013-01-08 MED ORDER — POTASSIUM CHLORIDE 10 MEQ/50ML IV SOLN
10.0000 meq | INTRAVENOUS | Status: AC
  Administered 2013-01-08 (×3): 10 meq via INTRAVENOUS
  Filled 2013-01-08: qty 150

## 2013-01-08 MED ORDER — ASPIRIN EC 325 MG PO TBEC
325.0000 mg | DELAYED_RELEASE_TABLET | Freq: Every day | ORAL | Status: DC
  Administered 2013-01-09 – 2013-01-18 (×10): 325 mg via ORAL
  Filled 2013-01-08 (×10): qty 1

## 2013-01-08 MED ORDER — SODIUM CHLORIDE 0.9 % IV SOLN: 250.0000 mL | INTRAVENOUS | Status: DC

## 2013-01-08 NOTE — Progress Notes (Signed)
Physical Therapy Treatment Patient Details Name: SHAKEETA GODETTE MRN: [id number] DOB: December 17, 1945 Today's Date: 01/08/2013 Time: 4098-1191 PT Time Calculation (min): 24 min  PT Assessment / Plan / Recommendation  History of Present Illness Ms. 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    PT Comments   Ms smucker has made some progress with activity today, able to perform ambulation with RW approx 60 ft but still with some noted instability and significant fatigue requiring multiple rest breaks and required increaed O2 to 8Liters. Patient assisted with LVAD device management. Patient required multiple cues for initial self test, but was able to perform battery check with minimal assist. Patient unable to perform battery conversion. Will need to increase focus on LVAD education and device management.  Anticipate mobility to progress as activity tolerance improved. Will continue to see as indicated and progress activity as tolerated.  Follow Up Recommendations  CIR           Equipment Recommendations   (ROLLATOR WALKER WITH SEAT)    Recommendations for Other Services OT consult;Rehab consult  Frequency Min 5X/week   Progress towards PT Goals Progress towards PT goals: Progressing toward goals (modestly)  Plan Current plan remains appropriate    Precautions / Restrictions Precautions Precautions: Sternal;Fall Precaution Comments: LVAD Required Braces or Orthoses:  (LVAD equipment and bag)   Pertinent Vitals/Pain  No pain, increased to 8 liters O2 for ambulation.    Mobility  Bed Mobility Details for Bed Mobility Assistance: Pt up in recliner upon arrival Transfers Transfers: Sit to Stand;Stand to Sit Sit to Stand: 1: +2 Total assist;Without upper extremity assist;From chair/3-in-1 Sit to Stand: Patient Percentage: 50% Stand to Sit: 1: +2 Total assist;Without upper extremity assist;To chair/3-in-1 Stand to Sit: Patient Percentage: 50% Details for Transfer Assistance: VCs for rocker technique and use of momentum, Cues for sternal precaution and compliance Ambulation/Gait Ambulation/Gait Assistance: 3: Mod assist Ambulation Distance (Feet): 60 Feet Assistive device: Rolling walker (will bring rollator for use next session) Ambulation/Gait Assistance Details: limited by fatigue Gait Pattern: Step-to pattern;Decreased stride length;Shuffle;Trunk flexed;Narrow base of support Gait velocity: decreased General Gait Details: patient still very limited and demonstrates some instability with ambulation requiring physical assist     PT Goals (current goals can now be found in the care plan section) Acute Rehab PT Goals Patient Stated Goal: get back to work at the [employer] and do the [employer] PT Goal Formulation: With patient Time For Goal Achievement: 01/16/13 Potential to Achieve Goals: Good  Visit Information  Last PT Received On: 01/08/13 Assistance Needed: +2 History of Present Illness: Ms. 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 EF 15-25% with mild to moderate RV dysfunction.Underwent HM II LVAD implant (under destination criteria) along with TV repair on 8/18. HeartMate 2 implantation with subsequent emergency centri- mag RVAD because of RV dysfunction. Patient had stable hemodynamics on BiVAD support.Returned to OR 8-25 for successful removal of RVAD Responsive but comfortable with IV sedation. Had successful delayed sternal closure  8-28-014     Subjective Data  Subjective: i feel better but still very tired Patient Stated Goal: get back to work at the [employer] and do the [employer] Arousal/Alertness: Awake/alert Behavior During Therapy: WFL for tasks assessed/performed Overall Cognitive Status: Within Functional Limits for tasks assessed Memory:  (decreased recall of setup of LVAD)    Balance   Instability noted  End of Session PT - End of Session Equipment Utilized During Treatment:  (LVAD black bag and equipment) Activity Tolerance: Patient limited by fatigue Patient left: in chair;with call bell/phone within reach Nurse Communication: Mobility status   GP     Fabio Asa 01/08/2013, 2:17 PM Charlotte Crumb, PT DPT  [phone]

## 2013-01-08 NOTE — Progress Notes (Signed)
Occupational Therapy Treatment Patient Details Name: SARAYA TIREY MRN: [id number] DOB: [birth year] Today's Date: 01/08/2013 Time: 4098-1191 OT Time Calculation (min): 24 min  OT Assessment / Plan / Recommendation  History of present illness Ms. 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    OT comments  Pt making progress with LVAD equipment and distance ambulated today (see PT note)  Follow Up Recommendations  CIR       Equipment Recommendations  3 in 1 bedside comode;Tub/shower bench    Recommendations for Other Services Rehab consult  Frequency Min 3X/week   Progress towards OT Goals Progress towards OT goals: Progressing toward goals  Plan Discharge plan remains appropriate    Precautions / Restrictions Precautions Precautions: Sternal;Fall Precaution Comments: LVAD Required Braces or Orthoses:  (LVAD equipment and bag)   Pertinent Vitals/Pain Required increased O2 to 8 liters for ambulation    ADL  ADL Comments: Pt intructed in self test, LVAD equipment, attaching batteries to clips and switching from battery system to module after returning to room.  Pt with grip weakness but able to apply enough pressure on button for self test.  Pt. required max A to switch from battery back to module.        OT Goals(current goals can now be found in the care plan section)    Visit Information  Last OT Received On: 01/08/13 Assistance Needed: +2 PT/OT Co-Evaluation/Treatment: Yes History of Present  Illness: Ms. 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           Cognition  Cognition Arousal/Alertness: Awake/alert Behavior During Therapy: [facility] for tasks assessed/performed Overall Cognitive Status: Within Functional Limits for tasks assessed Memory:  (decreased recall of setup of LVAD)    Mobility  Bed Mobility Details for Bed Mobility Assistance: Pt up in recliner upon arrival Transfers Transfers: Sit to Stand;Stand to Sit Sit to Stand: 1: +2 Total assist;Without upper extremity assist;From chair/3-in-1 Sit to Stand: Patient Percentage: 50% Stand to Sit: 1: +2 Total assist;Without upper extremity assist;To chair/3-in-1 Stand to Sit: Patient Percentage: 50%          End of Session OT - End of Session Equipment Utilized During Treatment: Oxygen (at 8 liters for ambulation; at rest 4 liters) Activity Tolerance: Patient limited by fatigue (but is progressing today) Patient left: in chair;with call bell/phone within reach Nurse Communication:  (nurse assisted [place] with recliner in hallway)       Evette Georges [phone] 01/08/2013, 1:58 PM

## 2013-01-08 NOTE — Progress Notes (Signed)
. HeartMate 2 Rounding Note  Subjective:   Postop day #21   HeartMate 2 implantation with subsequent emergency centri- mag RVAD because of  RV dysfunction. Patient  had  stable hemodynamics on BiVAD support.Returned to OR 8-25 for successful removal of RVAD   Responsive but comfortable with IV sedation.  Had successful delayed sternal closure  8-28-014  Patient continues to have stable hemodynamics after chest closure   Patient walking approximately 100 feet in the hallway, nitric oxide has been weaned off with stable CVP 12-14 and co-ox saturation 52% Patient will be moved to step down today for further physical therapy, education, and to wean- slowly - dobutamine drip infusing to improve the RV function   A postop, postextubation 2-D echocardiogram  on 9-2 shows significant improvement in RV function and decrease in tricuspid regurgitation  Patient's mental status and neuro status is intact. She remains generally weak but is now receiving physical therapy.   Patient being followed by nutritionist for low albumin, on supplemental protein feedings. Tolerating a dysphagia 3 diet.  UGI bleed from M-W tear- now inactive, on protonix  On coumadin , ASA with goal INR 2.0-2.5 LDH sl increased toda   diuresis for fluid overload- lasix dripoff and on po demedex  WBC >30k> 25>20>18 with procalcitonin 1.3>1.0 on Vanc, Primaxin and short course of IV Diflucan. Cultures neg , vancomycin Primaxin been stopped. Diflucan will be treated for one week.  Her wounds are healing well but she has a slight drainage from 3 old chest tube sites which are being packed with 2 x 2 gauze wet-to-dry dressings  Surgical incisions healing- daily VAD drive line dressing changed daily  LVAD INTERROGATION:  HeartMate II LVAD:  Flow 4.5 liters/min, speed 8600 power 3.2, PI 4.7.   Objective:    Vital Signs:   Temp:  [97.5 F (36.4 C)-98.5 F (36.9 C)] 97.5 F (36.4 C) (09/08 1639) Pulse Rate:  [76-98] 87  (09/08 1700) Resp:  [14-26] 23 (09/08 1700) SpO2:  [89 %-100 %] 100 % (09/08 1700) Weight:  [176 lb 9.4 oz (80.1 kg)] 176 lb 9.4 oz (80.1 kg) (09/08 0500) Last BM Date: 01/06/13 Mean arterial Pressure 84  Intake/Output:   Intake/Output Summary (Last 24 hours) at 01/08/13 1827 Last data filed at 01/08/13 1600  Gross per 24 hour  Intake 1282.6 ml  Output   3350 ml  Net -2067.4 ml     Physical Exam: General: extubated, responsive, comfortable HEENT: normal Neck: supple. JVP normal  No lymphadenopathy or thryomegaly appreciated. Cor: Mechanical heart sounds with LVAD hum present. Lungs: clear Abdomen: soft, nontender, nondistended. No hepatosplenomegaly. No bruits or masses. Good bowel sounds. Extremities: no cyanosis, clubbing, rash,  Sig edema remains  extremities warm Neuro: moves all 4 extremities  Telemetry: sinus 94/min  Labs: Basic Metabolic Panel:  Recent Labs Lab 01/04/13 1500 01/05/13 0400 01/05/13 1700 01/06/13 0440 01/07/13 0416 01/07/13 2210 01/08/13 0430  NA 143 145 143 144 141  --  135  K 2.8* 3.4* 3.4* 4.0 2.9* 3.1* 3.7  CL 100 104  --  104 100  --  93*  CO2 33* 33*  --  31 32  --  33*  GLUCOSE 187* 150* 113* 81 86  --  97  BUN 46* 46*  --  42* 38*  --  35*  CREATININE 1.00 1.02  --  1.16* 1.23*  --  1.20*  CALCIUM 9.5 9.4  --  9.5 9.1  --  9.3    Liver  Function Tests:  Recent Labs Lab 01/04/13 0420 01/05/13 0400 01/06/13 0440 01/07/13 0416 01/08/13 0430  AST 32 34 40* 36 42*  ALT 25 27 27 25 27   ALKPHOS 143* 136* 137* 125* 131*  BILITOT 0.9 0.9 0.9 0.9 1.0  PROT 5.7* 5.5* 5.7* 5.5* 5.9*  ALBUMIN 2.7* 2.7* 2.7* 2.6* 2.7*   No results found for this basename: LIPASE, AMYLASE,  in the last 168 hours No results found for this basename: AMMONIA,  in the last 168 hours  CBC:  Recent Labs Lab 01/04/13 0420 01/05/13 0400 01/05/13 1700 01/06/13 0440 01/07/13 0416 01/08/13 0430  WBC 18.9* 19.0*  --  18.8* 16.8* 16.4*  HGB 8.6* 8.4*  8.8* 8.1* 8.1* 8.5*  HCT 27.5* 27.0* 26.0* 25.6* 25.5* 27.5*  MCV 93.9 93.8  --  93.1 92.4 92.3  PLT 611* 579*  --  533* 493* 487*    INR:  Recent Labs Lab 01/04/13 0420 01/05/13 0400 01/06/13 0440 01/07/13 0416 01/08/13 0430  INR 2.05* 2.66* 3.11* 2.93* 3.50*    Other results:  EKG:   Imaging: No results found.   Medications:     Scheduled Medications: . amiodarone  200 mg Per NG tube BID  . antiseptic oral rinse  15 mL Mouth Rinse BID  . [START ON 01/09/2013] aspirin EC  325 mg Oral Daily  . citalopram  20 mg Oral Daily  . digoxin  0.125 mg Oral Daily  . feeding supplement  1 Container Oral TID WC  . feeding supplement  237 mL Oral Q24H  . fluconazole (DIFLUCAN) IV  200 mg Intravenous Q24H  . insulin aspart  0-24 Units Subcutaneous Q4H  . [START ON 01/09/2013] insulin glargine  12 Units Subcutaneous QHS  . pantoprazole  40 mg Oral BID  . sildenafil  40 mg Per Tube TID  . sodium chloride  10 mL Intravenous Q12H  . spironolactone  12.5 mg Oral Daily  . torsemide  20 mg Oral BID  . Warfarin - Pharmacist Dosing Inpatient   Does not apply q1800    Infusions: . sodium chloride 16 mL/hr at 01/08/13 0430  . sodium chloride 20 mL/hr (01/04/13 1419)  . DOBUTamine 4 mcg/kg/min (01/07/13 1900)    PRN Medications: acetaminophen (TYLENOL) oral liquid 160 mg/5 mL, ALPRAZolam, levalbuterol, ondansetron (ZOFRAN) IV, oxyCODONE-acetaminophen, potassium chloride, sodium chloride, sodium chloride, traMADol   Assessment:  1 status post implantable LVAD with subsequent severe RV dysfunction requiring right ventricular assist device- now removed after 1 week support 2 coagulopathy and bleeding requiring transfusion now improved 3 UGI bleeding- M-W tear, treated 4 Chest  Closure successful 5 Deconditioning- starting to walk with PT Plan/Discussion:   Anticoagulation with  coumadin- . Goal INR 2.5 Support native RV fx-low-dose, dobutamine,  revatio   Sternal incision inspected  and is intact and dry  continue oral Demadex  PT input, OOB to chair and ambulating now making significant progress  I reviewed the LVAD parameters from today, and compared the results to the patient's prior recorded data.  No programming changes were made.  The LVAD is functioning within specified parameters.  The patient performs LVAD self-test daily.  LVAD interrogation was negative for any significant power changes, alarms or PI events/speed drops.  LVAD equipment check completed and is in good working order.  Back-up equipment present.   LVAD education done on emergency procedures and precautions and reviewed exit site care.  Length of Stay: 31  [name] III,[place]rokee Boccio 01/08/2013, 6:27 PM

## 2013-01-08 NOTE — Progress Notes (Addendum)
I met with patient at bedside. Explained that our inpatient rehab facility is making preparations to admit LVAD pts, but not yet prepared. Our recommendation currently is increased acute therapy to prepare for her d/c home or LTACH pending insurance benefits. Please call me with any questions. I have discussed with RN CM. [phone]

## 2013-01-08 NOTE — Consult Note (Signed)
Physical Medicine and Rehabilitation Consult Reason for Consult: Deconditioning Referring Physician:  Dr. Gwenette Greet.    HPI: Kaitlin [name] is a 67 y.o. 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 EF 15-25% with mild to moderate RV dysfunction who underwent HM ll LVAD implant (under destination criteria) along with TV repair on 12/18/12 with subsequent emergent RVAD for RV dysfunction,. Taken back to OR for removal of RVAD on 08/25 and delayed secondary sternal closure on 12/28/12. UGI from MW tear treated with protonix.  PNA treated with vancomycin and Imipenem. On TPN for severe protein malnutrition. NO stropped and fluid overload improving with diuresis.  CKD/Electrolyte abnormalities being monitored closely. Patient is deconditioned and CIR recommended by team.    Review of Systems  Constitutional: Positive for malaise/fatigue (eating wears her out).  HENT: Negative for hearing loss.   Eyes: Negative for blurred vision and double vision.  Respiratory: Negative for cough and shortness of breath.   Cardiovascular: Negative for chest pain and palpitations.  Gastrointestinal: Negative for heartburn and nausea.       Abdominal tightness.   Neurological: Positive for weakness. Negative for headaches.   Past Medical History  Diagnosis Date  . Paroxysmal supraventricular tachycardia   . Hypokalemia   . Chronic systolic heart failure     a. 05/6107 Echo: EF 15%, mod to sev antlat and inflat HK, inf AK, mild to mod MR, mildly/mod reduced RV fxn, Mod TR, PASP .  [name] History of DVT (deep vein thrombosis)   . Dyslipidemia   . HTN (hypertension)   . Nonischemic cardiomyopathy     a. 06/2012 Echo: EF 15%;  b. 06/2012 Cath: nl except luminal irregs in LAD.  [name] Ventricular tachycardia     a. s/p ICD  . Goiter 2001  . Implantable cardiac defibrillator- Biotronik 2009    Single chamber  . [name] (left bundle branch block)     intermittent  .  Palliative care patient     Past Surgical History  Procedure Laterality Date  . None    . Cardiac defibrillator placement  01/2009  . Insertion of implantable left ventricular assist device N/A 12/18/2012    Procedure: INSERTION OF IMPLANTABLE LEFT VENTRICULAR ASSIST DEVICE;  Surgeon: Kerin Perna, MD;  Location: [facility] OR;  Service: Open Heart Surgery;  Laterality: N/A;  . Intraoperative transesophageal echocardiogram N/A 12/18/2012    Procedure: INTRAOPERATIVE TRANSESOPHAGEAL ECHOCARDIOGRAM;  Surgeon: Kerin Perna, MD;  Location: [facility] OR;  Service: Open Heart Surgery;  Laterality: N/A;  . Tricuspid valve replacement N/A 12/18/2012    Procedure: TRICUSPID VALVE REPAIR;  Surgeon: Kerin Perna, MD;  Location: [facility] OR;  Service: Open Heart Surgery;  Laterality: N/A;  . Insertion of implantable left ventricular assist device N/A 12/18/2012    Procedure: INSERTION OF IMPLANTABLE RIGHT VENTRICULAR ASSIST DEVICE;  Surgeon: Kerin Perna, MD;  Location: [facility] OR;  Service: Open Heart Surgery;  Laterality: N/A;  . Removal of centrimag ventricular assist device N/A 12/25/2012    Procedure: REMOVAL OF CENTRIMAG VENTRICULAR ASSIST DEVICE;  Surgeon: Kerin Perna, MD;  Location: [facility] OR;  Service: Open Heart Surgery;  Laterality: N/A;  PUMP STANDBY  . Intraoperative transesophageal echocardiogram N/A 12/25/2012    Procedure: INTRAOPERATIVE TRANSESOPHAGEAL ECHOCARDIOGRAM;  Surgeon: Kerin Perna, MD;  Location: [facility] OR;  Service: Open Heart Surgery;  Laterality: N/A;  . Esophagogastroduodenoscopy N/A 12/26/2012    Procedure: ESOPHAGOGASTRODUODENOSCOPY (EGD);  Surgeon: Beverley Fiedler,  MD;  Location: [facility] ENDOSCOPY;  Service: Gastroenterology;  Laterality: N/A;  Bedside  . Sternal closure N/A 12/27/2012    Procedure: STERNAL CLOSURE;  Surgeon: Kerin Perna, MD;  Location: [facility] OR;  Service: Thoracic;  Laterality: N/A;  . Mediastinal exploration N/A 12/27/2012    Procedure: MEDIASTINAL EXPLORATION;  Surgeon: Kerin Perna, MD;  Location: [facility] OR;  Service: Thoracic;  Laterality: N/A;   Family History  Problem Relation Age of Onset  . Emphysema Mother   . Heart disease Mother   . Heart attack Father   . Emphysema Brother    Social History:  Lived alone--plans on discharge to daughter's home. 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 She reports that she has never smoked. She does not have any smokeless tobacco history on file. She reports that she does drinks alcohol on occasion. She does not use illicit drugs.  Allergies: No Known Allergies  Medications Prior to Admission  Medication Sig Dispense Refill  . acetaminophen (TYLENOL) 325 MG tablet Take 650 mg by mouth every 6 (six) hours as needed for pain.      [name] amiodarone (PACERONE) 200 MG tablet Take 300 mg by mouth daily. Take 200 mg q am and 100mg  q pm      . aspirin EC 81 MG tablet Take 81 mg by mouth daily.      . carvedilol (COREG) 25 MG tablet Take 1 tablet (25 mg total) by mouth 2 (two) times daily with a meal.  60 tablet  3  . Cyanocobalamin (B-12 PO) Take 1 tablet by mouth daily.      . furosemide (LASIX) 40 MG tablet Take 40 mg by mouth daily.      [name] lisinopril (PRINIVIL,ZESTRIL) 10 MG tablet Take 0.5 tablets (5 mg total) by mouth daily.  30 tablet  3  . simvastatin (ZOCOR) 40 MG tablet TAKE 1 TABLET AT BEDTIME  90 tablet  3    Home: Home Living Family/patient expects to be discharged to:: Private residence Living Arrangements: Alone Available Help at Discharge: Family;Available 24 hours/day Type of Home: House Home Access: Stairs to enter [employer] of Steps: 5 Entrance Stairs-Rails: Can reach both Home Layout: One level Home Equipment: None  Functional History:   Functional Status:  Mobility: Bed Mobility Bed Mobility: Sit to Supine Rolling Right: 3: Mod assist Rolling Left: 4: Min assist Right Sidelying to Sit: 3: Mod assist;HOB elevated Right Sidelying to Sit: Patient Percentage: 30% Supine  to Sit: 5: Supervision;With rails Sitting - Scoot to Edge of Bed: 3: Mod assist Sit to Supine: 1: +2 Total assist Sit to Supine: Patient Percentage: 40% Sit to Sidelying Right: 2: Max assist Transfers Transfers: Sit to Stand;Stand to Sit Sit to Stand: 3: Mod assist;Without upper extremity assist;From chair/3-in-1 Sit to Stand: Patient Percentage: 60% Stand to Sit: 3: Mod assist;To bed Stand to Sit: Patient Percentage: 70% Stand Pivot Transfers: 1: +2 Total assist Stand Pivot Transfers: Patient Percentage: 50% Ambulation/Gait Ambulation/Gait Assistance: 1: +2 Total assist Ambulation/Gait: Patient Percentage: 70% Ambulation Distance (Feet): 6 Feet Assistive device:  (pushing wheel chair) Ambulation/Gait Assistance Details: limited by fatigue, dizziness and liquid stool incontinence Gait Pattern: Step-to pattern;Decreased stride length;Shuffle;Trunk flexed;Narrow base of support Gait velocity: decreased Stairs: No    ADL: ADL Eating/Feeding: Set up Where Assessed - Eating/Feeding: Chair Grooming: Maximal assistance Where Assessed - Grooming: Unsupported sitting Upper Body Bathing: +1 Total assistance Where Assessed - Upper Body Bathing: Unsupported sitting Lower Body Bathing: +  1 Total assistance Where Assessed - Lower Body Bathing: Supported sit to stand Upper Body Dressing: Maximal assistance Where Assessed - Upper Body Dressing: Supported sitting Lower Body Dressing: +1 Total assistance Where Assessed - Lower Body Dressing: Supported sit to Pharmacist, hospital: +2 Total assistance Toilet Transfer Method: Sit to stand;Stand pivot Acupuncturist: Customer service manager Used: Wheelchair;Other (comment) (o2; LVAD equip) Transfers/Ambulation Related to ADLs: +2 total A (pt ~70%).  Pt limited by dizziness and bowel incontinence ADL Comments: Pt intructed in self test, LVAD equipment, and switching module to battery system.  Pt with grip weakness making it difficult  to maintain adequate pressure on button for self test - required max A.  Pt. required total A to switch module to battery and back to power source.  Pt incontinent of bowel when standing.  Required total A for peri care in standiing, then pt too fatigued to ambulate and transferred to chair  Cognition: Cognition Overall Cognitive Status: Within Functional Limits for tasks assessed Orientation Level: Oriented X4 Cognition Arousal/Alertness: Awake/alert Behavior During Therapy: Flat affect Overall Cognitive Status: Within Functional Limits for tasks assessed Memory:  (slow to respond)  Blood pressure 87/71, pulse 86, temperature 97.9 F (36.6 C), temperature source Oral, resp. rate 20, height 5\' 9"  (1.753 m), weight 80.1 kg (176 lb 9.4 oz), SpO2 98.00%.   Physical Exam  Nursing note and vitals reviewed. Constitutional: She is oriented to person, place, and time. She appears well-developed and well-nourished.  HENT:  Head: Normocephalic and atraumatic.  Eyes: Pupils are equal, round, and reactive to light.  Neck: Normal range of motion. Neck supple.  Cardiovascular:  Hum present. HR 88 on monitor.  Pulmonary/Chest: Effort normal. She has decreased breath sounds in the right lower field and the left lower field.  Abdominal: Bowel sounds are normal. She exhibits no distension. There is tenderness.  Musculoskeletal:  1+ edema Left hand  Neurological: She is alert and oriented to person, place, and time. No cranial nerve deficit.  4 limb weakness 3 prox to 4 distal, limited by lines, IV etc in room. Sensory exam grossly intact.   Skin:  Chest wall incision with sutures in place.  Psychiatric: She has a normal mood and affect.   Results for orders placed during the hospital encounter of 12/08/12 (from the past 24 hour(s))  GLUCOSE, CAPILLARY     Status: None   Collection Time    01/07/13 11:46 AM      Result Value Range   Glucose-Capillary 93  70 - 99 mg/dL  CARBOXYHEMOGLOBIN      Status: Abnormal   Collection Time    01/07/13  2:00 PM      Result Value Range   Total hemoglobin 8.3 (*) 12.0 - 16.0 g/dL   O2 Saturation 16.1     Carboxyhemoglobin 1.9 (*) 0.5 - 1.5 %   Methemoglobin 0.8  0.0 - 1.5 %  GLUCOSE, CAPILLARY     Status: Abnormal   Collection Time    01/07/13  3:43 PM      Result Value Range   Glucose-Capillary 140 (*) 70 - 99 mg/dL   Comment 1 Notify RN    GLUCOSE, CAPILLARY     Status: Abnormal   Collection Time    01/07/13  7:44 PM      Result Value Range   Glucose-Capillary 142 (*) 70 - 99 mg/dL  POTASSIUM     Status: Abnormal   Collection Time    01/07/13 10:10 PM  Result Value Range   Potassium 3.1 (*) 3.5 - 5.1 mEq/L  GLUCOSE, CAPILLARY     Status: None   Collection Time    01/08/13 12:33 AM      Result Value Range   Glucose-Capillary 77  70 - 99 mg/dL  LACTATE DEHYDROGENASE     Status: Abnormal   Collection Time    01/08/13  4:30 AM      Result Value Range   LDH 540 (*) 94 - 250 U/L  CBC     Status: Abnormal   Collection Time    01/08/13  4:30 AM      Result Value Range   WBC 16.4 (*) 4.0 - 10.5 K/uL   RBC 2.98 (*) 3.87 - 5.11 MIL/uL   Hemoglobin 8.5 (*) 12.0 - 15.0 g/dL   HCT 16.1 (*) 09.6 - 04.5 %   MCV 92.3  78.0 - 100.0 fL   MCH 28.5  26.0 - 34.0 pg   MCHC 30.9  30.0 - 36.0 g/dL   RDW 40.9 (*) 81.1 - 91.4 %   Platelets 487 (*) 150 - 400 K/uL  COMPREHENSIVE METABOLIC PANEL     Status: Abnormal   Collection Time    01/08/13  4:30 AM      Result Value Range   Sodium 135  135 - 145 mEq/L   Potassium 3.7  3.5 - 5.1 mEq/L   Chloride 93 (*) 96 - 112 mEq/L   CO2 33 (*) 19 - 32 mEq/L   Glucose, Bld 97  70 - 99 mg/dL   BUN 35 (*) 6 - 23 mg/dL   Creatinine, Ser 7.82 (*) 0.50 - 1.10 mg/dL   Calcium 9.3  8.4 - 95.6 mg/dL   Total Protein 5.9 (*) 6.0 - 8.3 g/dL   Albumin 2.7 (*) 3.5 - 5.2 g/dL   AST 42 (*) 0 - 37 U/L   ALT 27  0 - 35 U/L   Alkaline Phosphatase 131 (*) 39 - 117 U/L   Total Bilirubin 1.0  0.3 - 1.2 mg/dL    GFR calc non Af Amer 46 (*) >90 mL/min   GFR calc Af Amer 54 (*) >90 mL/min  PROTIME-INR     Status: Abnormal   Collection Time    01/08/13  4:30 AM      Result Value Range   Prothrombin Time 33.8 (*) 11.6 - 15.2 seconds   INR 3.50 (*) 0.00 - 1.49  GLUCOSE, CAPILLARY     Status: Abnormal   Collection Time    01/08/13  4:33 AM      Result Value Range   Glucose-Capillary 100 (*) 70 - 99 mg/dL  CARBOXYHEMOGLOBIN     Status: Abnormal   Collection Time    01/08/13  4:38 AM      Result Value Range   Total hemoglobin 8.1 (*) 12.0 - 16.0 g/dL   O2 Saturation 21.3     Carboxyhemoglobin 1.7 (*) 0.5 - 1.5 %   Methemoglobin 0.8  0.0 - 1.5 %  GLUCOSE, CAPILLARY     Status: None   Collection Time    01/08/13  8:07 AM      Result Value Range   Glucose-Capillary 77  70 - 99 mg/dL   Comment 1 Documented in Chart     Comment 2 Notify RN     No results found.  Assessment/Plan: Diagnosis: deconditioning related to CHF/TR, multiple medical with LVAD 1. Does the need for close, 24 hr/day medical supervision in  concert with the patient's rehab needs make it unreasonable for this patient to be served in a less intensive setting? Potentially 2. Co-Morbidities requiring supervision/potential complications: see above 3. Due to bladder management, bowel management, safety, skin/wound care, disease management, medication administration and pain management, does the patient require 24 hr/day rehab nursing? No 4. Does the patient require coordinated care of a physician, rehab nurse, PT, OT to address physical and functional deficits in the context of the above medical diagnosis(es)? Potentially Addressing deficits in the following areas: balance, endurance, strength, bowel/bladder control, bathing, dressing, grooming and toileting 5. Can the patient actively participate in an intensive therapy program of at least 3 hrs of therapy per day at least 5 days per week? Potentially 6. The potential for patient to  make measurable gains while on inpatient rehab is fair 7. Anticipated functional outcomes upon discharge from inpatient rehab are n/a with PT, n/a with OT, n/an/ with SLP. 8. Estimated rehab length of stay to reach the above functional goals is: n/a 9. Does the patient have adequate social supports to accommodate these discharge functional goals? Potentially 10. Anticipated D/C setting: Home 11. Anticipated post D/C treatments: HH therapy 12. Overall Rehab/Functional Prognosis: good  RECOMMENDATIONS: This patient's condition is appropriate for continued rehabilitative care in the following setting: other/acute Patient has agreed to participate in recommended program. n/a Note that insurance prior authorization may be required for reimbursement for recommended care.  Comment: Our rehab staff, including PA's, RN's, and therapists, are not trained in the management of an LVAD. We have been in discussions regarding initiating such wide-spread education to ensure competency with the management of these patients on CIR, but that has not taken place as of yet. At this point, another option would be more intensive therapy on acute, perhaps on 2W, where all of the staff are competent with LVAD management, so that she could be physically "ramped up" to go home.   thanks  Ranelle Oyster, MD, [facility] Physical Medicine & Rehabilitation     01/08/2013

## 2013-01-08 NOTE — Plan of Care (Signed)
Problem: Phase III Progression Outcomes Goal: Time patient transferred to PCTU/Telemetry POD Outcome: Completed/Met Date Met:  01/08/13 1745 Transferred to 2W24 via wheelchair. Portable monitor and oxygen.  No changes.

## 2013-01-08 NOTE — Progress Notes (Signed)
ANTICOAGULATION CONSULT NOTE - Follow Up Consult  Pharmacy Consult for Coumadin Indication: LVAD  No Known Allergies  Patient Measurements: Height: 5\' 9"  (175.3 cm) Weight: 176 lb 9.4 oz (80.1 kg) IBW/kg (Calculated) : 66.2  Vital Signs: Temp: 97.9 F (36.6 C) (09/08 0809) Temp src: Oral (09/08 0809) Pulse Rate: 80 (09/08 0900)  Labs:  Recent Labs  01/06/13 0440 01/07/13 0416 01/08/13 0430  HGB 8.1* 8.1* 8.5*  HCT 25.6* 25.5* 27.5*  PLT 533* 493* 487*  LABPROT 30.9* 29.5* 33.8*  INR 3.11* 2.93* 3.50*  CREATININE 1.16* 1.23* 1.20*    Estimated Creatinine Clearance: 53 ml/min (by C-G formula based on Cr of 1.2).  Assessment: 67yof s/p LVAD and tricuspid valve repair on 8/18. Coumadin dosing changed to per pharmacy on 9/5.  INR has been labile likely due to fluconazole/amiodarone. INR this morning is trending up 2.9>>3.5. Fluconazole to continue 2 more days for 7 days total, so unfortunately will hold today's dose. No bleeding issues noted and cbc has remained stable. LDH 540 (stable). Regular diet resumed with supplements.  Vancomycin and primaxin have been stopped. No fevers noted and wbc has trended down to 16.  Goal of Therapy:  INR 2-2.5 Monitor platelets by anticoagulation protocol: Yes   Plan:  1) Hold warfarin tonight 2) INR daily 3) Fluconazole through 9/9 (7days total) 4) D/c vancomycin  Sheppard Coil PharmD., BCPS Clinical Pharmacist Pager [phone] 01/08/2013 11:24 AM

## 2013-01-08 NOTE — Progress Notes (Signed)
NUTRITION FOLLOW-UP/CONSULT  INTERVENTION: Continue Glucerna Shake po daily, each supplement provides 220 kcal and 10 grams of protein and Ensure Pudding po TID, each supplement provides 170 kcal and 4 grams of protein.  Recommend continue Regular diet to maximize PO choices. RD to add fruit cup to all trays per patient request. RD to continue to follow nutrition care plan.  NUTRITION DIAGNOSIS: Inadequate oral intake now related to variable appetite as evidenced by variable meal completion. Ongoing.  Goal: Intake to meet >90% of estimated nutrition needs. Unmet.  Monitor:  labs, weight trends, post-op healing and recovery, tolerance of supplements, PO intake  ASSESSMENT: LVAD inserted on 8/18. Pt found to have R ventricular dysfunction s/p LVAD implantation, required trip to OR later that evening for placement of RVAD. RVAD removed 8/25. Underwent sternotomy incision closure and wound VAC wash out 8/26.   RD consulted for nutrition assessment, note that RD is already following this patient.  Upgraded to a Regular diet on 9/5. NGT discontinued on 9/5. Currently ordered for Ensure Pudding TID and Glucerna Shake daily. Pt reports that her appetite is poor. She states that she enjoys her current oral nutrition supplement regimen. Would like fresh fruit on meal trays, RD to arrange.    Height: Ht Readings from Last 1 Encounters:  12/22/12 5\' 9"  (1.753 m)    Weight: Wt Readings from Last 1 Encounters:  01/08/13 176 lb 9.4 oz (80.1 kg)  Admission wt: 165 lb Current wt is up 11 lb.  BMI:  Body mass index is 29.3 kg/(m^2) - using admission weight.  Estimated Nutritional Needs: Kcal: 1800 - 2000 Protein: 90 - 125 gm Fluid: 1.5 - 2 L  Skin:  Abdomen incision Chest incision  Diet Order: General   EDUCATION NEEDS: -Education needs addressed. Provided many handouts on heart healthy eating, including label reading tips per patient request on 8/15.   Intake/Output Summary (Last  24 hours) at 01/08/13 1010 Last data filed at 01/08/13 0900  Gross per 24 hour  Intake 1463.8 ml  Output   5150 ml  Net -3686.2 ml    Last BM: 9/6  Labs:   Recent Labs Lab 01/06/13 0440 01/07/13 0416 01/07/13 2210 01/08/13 0430  NA 144 141  --  135  K 4.0 2.9* 3.1* 3.7  CL 104 100  --  93*  CO2 31 32  --  33*  BUN 42* 38*  --  35*  CREATININE 1.16* 1.23*  --  1.20*  CALCIUM 9.5 9.1  --  9.3  GLUCOSE 81 86  --  97    CBG (last 3)   Recent Labs  01/08/13 0033 01/08/13 0433 01/08/13 0807  GLUCAP 77 100* 77   Prealbumin  Date/Time Value Range Status  12/25/2012  3:39 AM 5.9* 17.0 - 34.0 mg/dL Final     Performed at [facility]   Triglycerides  Date/Time Value Range Status  12/25/2012  3:39 AM 159* <150 mg/dL Final    Scheduled Meds: . amiodarone  200 mg Per NG tube BID  . antiseptic oral rinse  15 mL Mouth Rinse BID  . aspirin  324 mg Per Tube Daily  . aspirin  300 mg Rectal Daily  . citalopram  20 mg Oral Daily  . digoxin  0.125 mg Oral Daily  . feeding supplement  1 Container Oral TID WC  . feeding supplement  237 mL Oral Q24H  . insulin aspart  0-24 Units Subcutaneous Q4H  . insulin glargine  6 Units  Subcutaneous BID  . pantoprazole  40 mg Oral BID  . potassium chloride  10 mEq Intravenous Q1 Hr x 3  . sildenafil  40 mg Per Tube TID  . sodium chloride  10 mL Intravenous Q12H  . spironolactone  12.5 mg Oral Daily  . torsemide  20 mg Oral BID  . Warfarin - Pharmacist Dosing Inpatient   Does not apply q1800    Continuous Infusions: . sodium chloride 20 mL/hr at 01/01/13 0400  . sodium chloride 16 mL/hr at 01/08/13 0430  . sodium chloride 20 mL/hr (01/04/13 1419)  . sodium chloride Stopped (01/06/13 2240)  . DOBUTamine 4 mcg/kg/min (01/07/13 1900)  . lactated ringers Stopped (12/28/12 0000)    Jarold Motto MS, RD, LDN Pager: [phone] After-hours pager: [phone]

## 2013-01-08 NOTE — Progress Notes (Signed)
Pt transferred to 2W24 from 2S with belongings and VAD equipment at bedside.   Pt placed in chair and hooked to Heartmate wall unit.  CCMD notified of transfer.Pt assessed. MAP within limits. Family updated and at bedside. Will continue to monitor pt closely.

## 2013-01-08 NOTE — Progress Notes (Signed)
Assessed for Picc line placement, veins in right arm are to small to accommodate Picc placement with the veins at the biggest point is at 38% and then splits into smaller veins.Patient has a left side pacer.Therefore we are unable to use the left arm. Staff Nurse aware.

## 2013-01-08 NOTE — Progress Notes (Addendum)
Patient ID: Kaitlin [name], female   DOB: [birth year], 67 y.o.   MRN: [id number] HeartMate 2 Rounding Note  Subjective:    Ms. 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 EF 15-25% with mild to moderate RV dysfunction  8/18 Underwent HM II LVAD implant (under destination criteria) along with TV repair.  Taken back to the OR for RVAD support.  8/25 was taken back to OR for RVAD explant. 8/26 EGD which showed MW tear in esophagus. Injected and clipped. No further bleeding.  8/27  Back to OR for chest closure. 8/31 extubated  NO stopped yesterday. Sildenafil increased to 60 tid.  Diuresis really improving. CVP 12-14. K supped this am. Walked outside of room for a bit but gets exhausted. Co-ox 52% Denies SOB, orthopnea or PND.  LVAD INTERROGATION:   HeartMate II LVAD:  Flow 4.2  Liters/min  speed 8600  power 4.2, PI 7.0  No PI events  Objective:    Vital Signs:   Temp:  [97.5 F (36.4 C)-98.5 F (36.9 C)] 98.5 F (36.9 C) (09/08 0400) Pulse Rate:  [64-106] 86 (09/08 0700) Resp:  [15-28] 20 (09/08 0700) SpO2:  [87 %-100 %] 98 % (09/08 0700) Weight:  [80.1 kg (176 lb 9.4 oz)] 80.1 kg (176 lb 9.4 oz) (09/08 0500) Last BM Date: 01/06/13 Mean arterial Pressure 80s  Intake/Output:   Intake/Output Summary (Last 24 hours) at 01/08/13 0746 Last data filed at 01/08/13 0700  Gross per 24 hour  Intake 1734.4 ml  Output   5325 ml  Net -3590.6 ml     Physical Exam: General: lying in chair HEENT: normal Neck: supple. JVP  To jaw.  Carotids 2+ bilat; no bruits. No lymphadenopathy or thryomegaly appreciated.  Cor: LVAD hum present.;Lungs: course Abdomen: soft, nontender, mildly distended. Hypoactive BS Driveline: C/D/I; securement device intact  Extremities: no cyanosis, clubbing, rash, 1+ edema.  Neuro: awake follows commands moves all 4 extremities w/o difficulty. Affect pleasant  Telemetry: NSR 90s  Labs: Basic  Metabolic Panel:  Recent Labs Lab 01/04/13 1500 01/05/13 0400 01/05/13 1700 01/06/13 0440 01/07/13 0416 01/07/13 2210 01/08/13 0430  NA 143 145 143 144 141  --  135  K 2.8* 3.4* 3.4* 4.0 2.9* 3.1* 3.7  CL 100 104  --  104 100  --  93*  CO2 33* 33*  --  31 32  --  33*  GLUCOSE 187* 150* 113* 81 86  --  97  BUN 46* 46*  --  42* 38*  --  35*  CREATININE 1.00 1.02  --  1.16* 1.23*  --  1.20*  CALCIUM 9.5 9.4  --  9.5 9.1  --  9.3    Liver Function Tests:  Recent Labs Lab 01/04/13 0420 01/05/13 0400 01/06/13 0440 01/07/13 0416 01/08/13 0430  AST 32 34 40* 36 42*  ALT 25 27 27 25 27   ALKPHOS 143* 136* 137* 125* 131*  BILITOT 0.9 0.9 0.9 0.9 1.0  PROT 5.7* 5.5* 5.7* 5.5* 5.9*  ALBUMIN 2.7* 2.7* 2.7* 2.6* 2.7*   No results found for this basename: LIPASE, AMYLASE,  in the last 168 hours No results found for this basename: AMMONIA,  in the last 168 hours  CBC:  Recent Labs Lab 01/04/13 0420 01/05/13 0400 01/05/13 1700 01/06/13 0440 01/07/13 0416 01/08/13 0430  WBC 18.9* 19.0*  --  18.8* 16.8* 16.4*  HGB 8.6* 8.4* 8.8* 8.1* 8.1* 8.5*  HCT 27.5* 27.0* 26.0* 25.6* 25.5* 27.5*  MCV 93.9 93.8  --  93.1 92.4 92.3  PLT 611* 579*  --  533* 493* 487*    INR:  Recent Labs Lab 01/04/13 0420 01/05/13 0400 01/06/13 0440 01/07/13 0416 01/08/13 0430  INR 2.05* 2.66* 3.11* 2.93* 3.50*    Imaging: No results found.   Medications:     Scheduled Medications: . amiodarone  200 mg Per NG tube BID  . antiseptic oral rinse  15 mL Mouth Rinse BID  . aspirin  324 mg Per Tube Daily  . aspirin  300 mg Rectal Daily  . citalopram  20 mg Oral Daily  . digoxin  0.125 mg Oral Daily  . feeding supplement  237 mL Oral Q24H  . insulin aspart  0-24 Units Subcutaneous Q4H  . insulin glargine  6 Units Subcutaneous BID  . magic mouthwash  5 mL Oral QID  . metolazone  2.5 mg Oral Daily  . pantoprazole  40 mg Oral BID  . sildenafil  60 mg Per Tube TID  . sodium chloride  10 mL  Intravenous Q12H  . torsemide  20 mg Oral BID  . vancomycin  1,250 mg Intravenous Q24H  . Warfarin - Pharmacist Dosing Inpatient   Does not apply q1800    Infusions: . sodium chloride 20 mL/hr at 01/01/13 0400  . sodium chloride 16 mL/hr at 01/08/13 0430  . sodium chloride 20 mL/hr (01/04/13 1419)  . sodium chloride Stopped (01/06/13 2240)  . DOBUTamine 4 mcg/kg/min (01/07/13 1900)  . lactated ringers Stopped (12/28/12 0000)  . norepinephrine (LEVOPHED) Adult infusion      PRN Medications: acetaminophen (TYLENOL) oral liquid 160 mg/5 mL, ALPRAZolam, levalbuterol, ondansetron (ZOFRAN) IV, oxyCODONE-acetaminophen, potassium chloride, sodium chloride, sodium chloride, traMADol   Assessment:   1. Acute on chronic systolic HF with biventricular HF EF 10%  --S/p HM II VAD with TV repair 12/18/12  --s/p RVAD. - explanted 8/25 2. 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 Cardiogenic shock  4. VT s/p Biotronik ICD on amio 5. Chronic renal failure, stage III 6. 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 ABL anemia  8. PNA 9. Severe protein calorie malnutrition - on TPN 10. Hyponatremia/Hypokalemia 11. 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 Acute respiratory failure  Plan/Discussion:    Improving steadily. Co-ox down slightly with stopping NO. Previously didn't tolerate titration of sildenafil so will cut back to 40 tid. Watch co-ox. Continue dobutamine for now.   Diuresing well with torsemide and metalozone. Will hold metolazone today. Add spiro.   Mobilize. Will d/w VAD Coordinator about possible CIR transfer.  PNA; Afebrile, WBCs falling.  On fluconazole and vancomycin. Will stop vanc. Continue fluconazole for 7 days.    INR supratherapeutic. Pharmacy to dose coumadin.  On ASA.  LDH and hemoglobin stable.   [name] 7:46 AM Length of Stay: 31 VAD Team Pager [phone] (7am - 7am) VAD Issue Non-VAD issues HF Pager [phone]

## 2013-01-09 ENCOUNTER — Inpatient Hospital Stay (HOSPITAL_COMMUNITY): Payer: Commercial Managed Care - PPO

## 2013-01-09 ENCOUNTER — Telehealth: Payer: Self-pay | Admitting: Cardiothoracic Surgery

## 2013-01-09 DIAGNOSIS — I369 Nonrheumatic tricuspid valve disorder, unspecified: Secondary | ICD-10-CM

## 2013-01-09 DIAGNOSIS — I428 Other cardiomyopathies: Secondary | ICD-10-CM

## 2013-01-09 DIAGNOSIS — Z95818 Presence of other cardiac implants and grafts: Secondary | ICD-10-CM

## 2013-01-09 DIAGNOSIS — I5022 Chronic systolic (congestive) heart failure: Secondary | ICD-10-CM

## 2013-01-09 LAB — GLUCOSE, CAPILLARY: Glucose-Capillary: 129 mg/dL — ABNORMAL HIGH (ref 70–99)

## 2013-01-09 LAB — LACTATE DEHYDROGENASE: LDH: 527 U/L — ABNORMAL HIGH (ref 94–250)

## 2013-01-09 LAB — PREALBUMIN: Prealbumin: 20.9 mg/dL (ref 17.0–34.0)

## 2013-01-09 LAB — PROTIME-INR
INR: 3.03 — ABNORMAL HIGH (ref 0.00–1.49)
Prothrombin Time: 30.3 seconds — ABNORMAL HIGH (ref 11.6–15.2)

## 2013-01-09 MED ORDER — FE FUMARATE-B12-VIT C-FA-IFC PO CAPS
1.0000 | ORAL_CAPSULE | Freq: Three times a day (TID) | ORAL | Status: DC
  Administered 2013-01-09 – 2013-01-18 (×28): 1 via ORAL
  Filled 2013-01-09 (×32): qty 1

## 2013-01-09 MED ORDER — WARFARIN SODIUM 2 MG PO TABS
2.0000 mg | ORAL_TABLET | Freq: Once | ORAL | Status: DC
Start: 2013-01-09 — End: 2013-01-10
  Filled 2013-01-09: qty 1

## 2013-01-09 MED ORDER — INSULIN ASPART 100 UNIT/ML ~~LOC~~ SOLN
0.0000 [IU] | Freq: Three times a day (TID) | SUBCUTANEOUS | Status: DC
  Administered 2013-01-13: 2 [IU] via SUBCUTANEOUS
  Administered 2013-01-15: 4 [IU] via SUBCUTANEOUS
  Administered 2013-01-17 (×2): 2 [IU] via SUBCUTANEOUS

## 2013-01-09 MED ORDER — WARFARIN VIDEO
Freq: Once | Status: AC
  Administered 2013-01-10: 16:00:00

## 2013-01-09 MED ORDER — PATIENT'S GUIDE TO USING COUMADIN BOOK
Freq: Once | Status: AC
  Administered 2013-01-09: 18:00:00
  Filled 2013-01-09: qty 1

## 2013-01-09 NOTE — Progress Notes (Signed)
  Echocardiogram 2D Echocardiogram has been performed.  Georgian Co 01/09/2013, 5:01 PM

## 2013-01-09 NOTE — Progress Notes (Signed)
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    OT comments  Pt making progress, next session to focus on hand strengthening   Follow Up Recommendations  CIR       Equipment Recommendations  3 in 1 bedside comode;Tub/shower bench    Recommendations for Other Services Rehab consult  Frequency Min 3X/week   Progress towards OT Goals Progress towards OT goals: Progressing toward goals  Plan Discharge plan remains appropriate    Precautions / Restrictions Precautions Precautions: Sternal;Fall Precaution Comments: LVAD   Pertinent Vitals/Pain O2 remained in 90's on 4 liters    ADL  Toilet Transfer: Min Pension scheme manager Method: Sit to Barista:  (bed>out into hallway>around circle with breaks>bed) Equipment Used: Rolling walker (4 wheel) Transfers/Ambulation Related to ADLs: min guard A for sit to stand with pt with hands on knees and leaning forward ADL Comments: Pt intructed putting on vest, attaching batteries to clips and switching to/from battery system to/from module.  Pt with grip weakness but able to apply enough pressure own button for self test--trouble with disconnecting/connecting.  Pt. required max A to switch from battery back  to module.        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           Cognition  Cognition Arousal/Alertness: Awake/alert Behavior During Therapy: [facility] for tasks assessed/performed Overall Cognitive Status: Within Functional Limits for tasks assessed    Mobility  Bed Mobility Bed Mobility: Sit to Sidelying Right Sit to Sidelying Right: 4: Min assist;HOB flat Details for Bed Mobility Assistance: cueing for bil UE placement and sequence Transfers Sit to Stand: 4: Min guard;From bed;Other (comment) (from rollator) Stand to Sit: 4: Min guard;To bed;Other (comment) (to rollator) Details for Transfer Assistance: cueing for hand placement, locking rollator brakes and safety with sitting, decreased control with descent to rollator due to fatigue          End of Session OT - End of Session Equipment Utilized During Treatment: Oxygen (4 liters) Activity Tolerance: Patient limited by fatigue (but tolerated better than yesteday) Patient left: in bed;with call bell/phone within reach Nurse Communication:  (nursing saw her ambulating with [place])    Darcel Bayley,  Hulan Fray [phone] 01/09/2013, 1:39 PM

## 2013-01-09 NOTE — Progress Notes (Signed)
ANTICOAGULATION CONSULT NOTE - Follow Up Consult  Pharmacy Consult for Coumadin Indication: LVAD  No Known Allergies  Patient Measurements: Height: 5\' 9"  (175.3 cm) Weight: 174 lb 9.7 oz (79.2 kg) IBW/kg (Calculated) : 66.2  Vital Signs: Temp: 96.9 F (36.1 C) (09/09 0400) Temp src: Oral (09/09 0400)  Labs:  Recent Labs  01/07/13 0416 01/08/13 0430 01/09/13 0500  HGB 8.1* 8.5*  --   HCT 25.5* 27.5*  --   PLT 493* 487*  --   LABPROT 29.5* 33.8* 30.3*  INR 2.93* 3.50* 3.03*  CREATININE 1.23* 1.20*  --     Estimated Creatinine Clearance: 48.8 ml/min (by C-G formula based on Cr of 1.2).  Assessment: 67yof s/p LVAD and tricuspid valve repair on 8/18. Coumadin dosing changed to per pharmacy on 9/5.  INR has been labile likely due to fluconazole/amiodarone. INR now trending back down 2.9>>3.5>3.0. Fluconazole to continue 1 more day for 7 days total, will restart at conservative dose tonight. No bleeding issues noted and cbc has remained stable. LDH 527 (stable). Regular diet resumed with supplements.  Vancomycin and primaxin have been stopped. No fevers noted and wbc has trended down to 16.  Goal of Therapy:  INR 2-2.5 Monitor platelets by anticoagulation protocol: Yes   Plan:  1) warfarin 2mg  tonight 2) INR daily 3) Fluconazole through 9/9 (7days total) 4) off abx  Sheppard Coil PharmD., BCPS Clinical Pharmacist Pager [phone] 01/09/2013 8:16 AM

## 2013-01-09 NOTE — Progress Notes (Signed)
. HeartMate 2 Rounding Note  Subjective:   Postop day #22   HeartMate 2 implantation with subsequent emergency centri- mag RVAD because of  RV dysfunction. Patient  had  stable hemodynamics on BiVAD support.Returned to OR 8-25 for successful removal of RVAD   Responsive but comfortable with IV sedation.  Had successful delayed sternal closure  8-28-014  Patient continues to have stable hemodynamics after chest closure Continuing low-dose dobutamine but will start weaning soon  Patient now in step down and had uneventful night. No PI events. Mean arterial blood pressure and heart rhythm stable. No fever.  INR greater than 3.0 so will not remove epicardial pacing wires today  Patient appears stronger and more responsive today and year to start education regarding her LVAD   A postop, postextubation 2-D echocardiogram  on 9-2 shows significant improvement in RV function and decrease in tricuspid regurgitation. Repeat 2-D echo pending at higher pump speed 9600 RPM    Patient being followed by nutritionist for low albumin, on supplemental protein feedings. Tolerating a dysphagia 3 diet.  UGI bleed from M-W tear- now inactive, on protonix  On coumadin , ASA with goal INR 2.0-2.5 LDH levels are being followed serially and are satisfactory   diuresis for fluid overload- on oral Demadex  WBC >30k> 25>20>18 with procalcitonin 1.3>1.0 on Vanc, Primaxin and short course of IV Diflucan. Cultures neg , vancomycin Primaxin been stopped. Diflucan will be treated for one week. All chest tube sites are being packed with wet-to-dry 2 x 2 gauze with slight drainage. Main sternal incision clean and dry. Driveline site clean and dry    daily VAD drive line dressing changed daily  LVAD INTERROGATION:  HeartMate II LVAD:  Flow 4.5 liters/min, speed 8600 power 3.2, PI 4.7.   Objective:    Vital Signs:   Temp:  [96.9 F (36.1 C)-98 F (36.7 C)] 96.9 F (36.1 C) (09/09 0400) Pulse Rate:  [86-96] 92  (09/09 1040) Resp:  [18-26] 22 (09/08 2000) SpO2:  [90 %-100 %] 96 % (09/08 2000) Weight:  [174 lb 9.7 oz (79.2 kg)] 174 lb 9.7 oz (79.2 kg) (09/09 0700) Last BM Date: 01/07/13 Mean arterial Pressure 84  Intake/Output:   Intake/Output Summary (Last 24 hours) at 01/09/13 1128 Last data filed at 01/09/13 1103  Gross per 24 hour  Intake    553 ml  Output   3900 ml  Net  -3347 ml     Physical Exam: General: extubated, responsive, comfortable HEENT: normal Neck: supple. JVP normal  No lymphadenopathy or thryomegaly appreciated. Cor: Mechanical heart sounds with LVAD hum present. Lungs: clear Abdomen: soft, nontender, nondistended. No hepatosplenomegaly. No bruits or masses. Good bowel sounds. Extremities: no cyanosis, clubbing, rash,  Sig edema remains  extremities warm Neuro: moves all 4 extremities  Telemetry: sinus 94/min  Labs: Basic Metabolic Panel:  Recent Labs Lab 01/04/13 1500 01/05/13 0400 01/05/13 1700 01/06/13 0440 01/07/13 0416 01/07/13 2210 01/08/13 0430  NA 143 145 143 144 141  --  135  K 2.8* 3.4* 3.4* 4.0 2.9* 3.1* 3.7  CL 100 104  --  104 100  --  93*  CO2 33* 33*  --  31 32  --  33*  GLUCOSE 187* 150* 113* 81 86  --  97  BUN 46* 46*  --  42* 38*  --  35*  CREATININE 1.00 1.02  --  1.16* 1.23*  --  1.20*  CALCIUM 9.5 9.4  --  9.5 9.1  --  9.3    Liver Function Tests:  Recent Labs Lab 01/04/13 0420 01/05/13 0400 01/06/13 0440 01/07/13 0416 01/08/13 0430  AST 32 34 40* 36 42*  ALT 25 27 27 25 27   ALKPHOS 143* 136* 137* 125* 131*  BILITOT 0.9 0.9 0.9 0.9 1.0  PROT 5.7* 5.5* 5.7* 5.5* 5.9*  ALBUMIN 2.7* 2.7* 2.7* 2.6* 2.7*   No results found for this basename: LIPASE, AMYLASE,  in the last 168 hours No results found for this basename: AMMONIA,  in the last 168 hours  CBC:  Recent Labs Lab 01/04/13 0420 01/05/13 0400 01/05/13 1700 01/06/13 0440 01/07/13 0416 01/08/13 0430  WBC 18.9* 19.0*  --  18.8* 16.8* 16.4*  HGB 8.6* 8.4* 8.8*  8.1* 8.1* 8.5*  HCT 27.5* 27.0* 26.0* 25.6* 25.5* 27.5*  MCV 93.9 93.8  --  93.1 92.4 92.3  PLT 611* 579*  --  533* 493* 487*    INR:  Recent Labs Lab 01/05/13 0400 01/06/13 0440 01/07/13 0416 01/08/13 0430 01/09/13 0500  INR 2.66* 3.11* 2.93* 3.50* 3.03*    Other results:  EKG:   Imaging: Dg Chest 2 View  01/09/2013   *RADIOLOGY REPORT*  Clinical Data: Ventricular assist device  CHEST - 2 VIEW  Comparison: 01/06/2013  Findings: Stable right subclavian vein central venous catheter and left subclavian AICD device.  Stable cardiomegaly.  Bilateral central and basilar airspace opacities improved.  Ventricular assist device is stable in position in the left upper quadrant.  No pneumothorax.  IMPRESSION: Improving bilateral airspace disease.   Original Report Authenticated By: Jolaine Click, M.D.     Medications:     Scheduled Medications: . amiodarone  200 mg Per NG tube BID  . antiseptic oral rinse  15 mL Mouth Rinse BID  . aspirin EC  325 mg Oral Daily  . citalopram  20 mg Oral Daily  . digoxin  0.125 mg Oral Daily  . feeding supplement  1 Container Oral TID WC  . feeding supplement  237 mL Oral Q24H  . ferrous fumarate-b12-vitamic C-folic acid  1 capsule Oral TID PC  . fluconazole (DIFLUCAN) IV  200 mg Intravenous Q24H  . insulin aspart  0-24 Units Subcutaneous TID AC & HS  . insulin glargine  12 Units Subcutaneous QHS  . pantoprazole  40 mg Oral BID  . patient's guide to using coumadin book   Does not apply Once  . sildenafil  40 mg Per Tube TID  . sodium chloride  10 mL Intravenous Q12H  . spironolactone  12.5 mg Oral Daily  . torsemide  20 mg Oral BID  . warfarin  2 mg Oral ONCE-1800  . [START ON 01/10/2013] warfarin   Does not apply Once  . Warfarin - Pharmacist Dosing Inpatient   Does not apply q1800    Infusions: . sodium chloride    . sodium chloride 250 mL (01/08/13 2157)  . DOBUTamine 4 mcg/kg/min (01/09/13 0433)    PRN Medications: acetaminophen  (TYLENOL) oral liquid 160 mg/5 mL, ALPRAZolam, ondansetron (ZOFRAN) IV, oxyCODONE-acetaminophen, potassium chloride, sodium chloride, sodium chloride, traMADol   Assessment:  1 status post implantable LVAD with subsequent severe RV dysfunction requiring right ventricular assist device- now removed after 1 week support 2 coagulopathy and bleeding requiring transfusion now improved 3 UGI bleeding- M-W tear, treated 4 Chest  Closure successful 5 Deconditioning- starting to walk with PT Plan/Discussion:   Anticoagulation with  coumadin- . Goal INR 2.5 Support native RV fx-low-dose, dobutamine,  revatio   Sternal  incision inspected and is intact and dry  continue oral Demadex  PT input, OOB to chair and ambulating now making significant progress, Followup 2-D echocardiogram today and will need new central line replaced because lack of peripheral IV access    I reviewed the LVAD parameters from today, and compared the results to the patient's prior recorded data.  No programming changes were made.  The LVAD is functioning within specified parameters.  The patient performs LVAD self-test daily.  LVAD interrogation was negative for any significant power changes, alarms or PI events/speed drops.  LVAD equipment check completed and is in good working order.  Back-up equipment present.   LVAD education done on emergency procedures and precautions and reviewed exit site care.  Length of Stay: 32  [name] III,PETER 01/09/2013, 11:28 AM

## 2013-01-09 NOTE — Progress Notes (Signed)
Delivered HM II VAD Discharge Binder pt's room. Reviewed contents and asked pt and caregiver(s) to complete reading contents of binder.  VAD coordinator's plan is to schedule and complete discharge teaching with pt and caregiver(s) prior to discharge. Daughter Sherry Ruffing) identifies herself as primary caregiver (she will stay at mother's home at time of discharge) and sister and cousin as back up caregivers.   VAD patient Kaitlin [name]) in room and spoke with patient and daughter; shared VAD experiences and offered support; gave daughter her contact info to contact if questions or concerns.  PT/OT working with pt; pt demonstrated connecting/disconnecting power cables and changing power source from  batteries to power module. Encouraged pt and caregiver(s) to perform these activities during hospital stay.   Encouraged pt to increase calorie intake (especially high protein calories) to ensure healing of surgical wounds and drive line site. Pt and daughter verbalized understanding of above.  Demonstrated dressing change with daughter; sterile gloves given for practice. Encouraged daughter to perform dressing change while patient hospitalized under nursing supervision; daughter would like to observe one more dressing change. Nurse will continue teaching sterile technique and dressing change.  Drive line exit site with no tissue ingrowth noted; no sutures present. Small amount old bloody drainage with no foul odor; no erythema, tenderness noted.  Site dressed with gauze dressing and aquacel strip; attachment device in place. Reinforced need to keep drive line secure to promote tissue ingrowth and prevent infection.  Pt and daughter verbalized understanding of same.

## 2013-01-09 NOTE — Progress Notes (Signed)
Echo tech in room to do Echo.  Informed tech of pending sterile dressing change with daughter and VAD coordinator.  Echo tech informed RN of possibility of delay for obtaining Echo today.  MD aware and ok with delay. Will inform Echo department of delay and notify when dressing complete to see if Echo can be obtained today, 9/9.  Will continue to monitor pt closely.

## 2013-01-09 NOTE — Progress Notes (Signed)
Patient ID: Kaitlin [name], female   DOB: 11/27/1945, 67 y.o.   MRN: [id number] HeartMate 2 Rounding Note  Subjective:    Ms. 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 EF 15-25% with mild to moderate RV dysfunction  8/18 Underwent HM II LVAD implant (under destination criteria) along with TV repair.  Taken back to the OR for RVAD support.  8/25 was taken back to OR for RVAD explant. 8/26 EGD which showed MW tear in esophagus. Injected and clipped. No further bleeding.  8/27  Back to OR for chest closure. 8/31 extubated  Moved out of ICU yesterday. Continues to diurese. Appetite improved. INR 3.0. Other labs pending  LVAD INTERROGATION:   HeartMate II LVAD:  Flow 4.2  Liters/min  speed 8600  power 4.2, PI 5.6  No PI events  Objective:    Vital Signs:   Temp:  [96.9 F (36.1 C)-98 F (36.7 C)] 96.9 F (36.1 C) (09/09 0400) Pulse Rate:  [80-96] 96 (09/08 2000) Resp:  [14-26] 22 (09/08 2000) SpO2:  [90 %-100 %] 96 % (09/08 2000) Weight:  [79.2 kg (174 lb 9.7 oz)] 79.2 kg (174 lb 9.7 oz) (09/09 0700) Last BM Date: 01/07/13 Mean arterial Pressure 80s  Intake/Output:   Intake/Output Summary (Last 24 hours) at 01/09/13 0845 Last data filed at 01/09/13 0708  Gross per 24 hour  Intake  714.8 ml  Output   3625 ml  Net -2910.2 ml     Physical Exam: General: lying in chair HEENT: normal Neck: supple. JVP 10 Carotids 2+ bilat; no bruits. No lymphadenopathy or thryomegaly appreciated.  Cor: LVAD hum present.;Lungs: course Abdomen: soft, nontender, normal BS Driveline: C/D/I; securement device intact  Extremities: no cyanosis, clubbing, rash, tr edema.  Neuro: awake follows commands moves all 4 extremities w/o difficulty. Affect pleasant  Telemetry: NSR 90s  Labs: Basic Metabolic Panel:  Recent Labs Lab 01/04/13 1500 01/05/13 0400 01/05/13 1700 01/06/13 0440 01/07/13 0416 01/07/13 2210 01/08/13 0430  NA  143 145 143 144 141  --  135  K 2.8* 3.4* 3.4* 4.0 2.9* 3.1* 3.7  CL 100 104  --  104 100  --  93*  CO2 33* 33*  --  31 32  --  33*  GLUCOSE 187* 150* 113* 81 86  --  97  BUN 46* 46*  --  42* 38*  --  35*  CREATININE 1.00 1.02  --  1.16* 1.23*  --  1.20*  CALCIUM 9.5 9.4  --  9.5 9.1  --  9.3    Liver Function Tests:  Recent Labs Lab 01/04/13 0420 01/05/13 0400 01/06/13 0440 01/07/13 0416 01/08/13 0430  AST 32 34 40* 36 42*  ALT 25 27 27 25 27   ALKPHOS 143* 136* 137* 125* 131*  BILITOT 0.9 0.9 0.9 0.9 1.0  PROT 5.7* 5.5* 5.7* 5.5* 5.9*  ALBUMIN 2.7* 2.7* 2.7* 2.6* 2.7*   No results found for this basename: LIPASE, AMYLASE,  in the last 168 hours No results found for this basename: AMMONIA,  in the last 168 hours  CBC:  Recent Labs Lab 01/04/13 0420 01/05/13 0400 01/05/13 1700 01/06/13 0440 01/07/13 0416 01/08/13 0430  WBC 18.9* 19.0*  --  18.8* 16.8* 16.4*  HGB 8.6* 8.4* 8.8* 8.1* 8.1* 8.5*  HCT 27.5* 27.0* 26.0* 25.6* 25.5* 27.5*  MCV 93.9 93.8  --  93.1 92.4 92.3  PLT 611* 579*  --  533* 493* 487*    INR:  Recent Labs Lab 01/05/13 0400 01/06/13 0440 01/07/13 0416 01/08/13 0430 01/09/13 0500  INR 2.66* 3.11* 2.93* 3.50* 3.03*    Imaging: Dg Chest 2 View  01/09/2013   *RADIOLOGY REPORT*  Clinical Data: Ventricular assist device  CHEST - 2 VIEW  Comparison: 01/06/2013  Findings: Stable right subclavian vein central venous catheter and left subclavian AICD device.  Stable cardiomegaly.  Bilateral central and basilar airspace opacities improved.  Ventricular assist device is stable in position in the left upper quadrant.  No pneumothorax.  IMPRESSION: Improving bilateral airspace disease.   Original Report Authenticated By: Jolaine Click, M.D.     Medications:     Scheduled Medications: . amiodarone  200 mg Per NG tube BID  . antiseptic oral rinse  15 mL Mouth Rinse BID  . aspirin EC  325 mg Oral Daily  . citalopram  20 mg Oral Daily  . digoxin  0.125  mg Oral Daily  . feeding supplement  1 Container Oral TID WC  . feeding supplement  237 mL Oral Q24H  . ferrous fumarate-b12-vitamic C-folic acid  1 capsule Oral TID PC  . fluconazole (DIFLUCAN) IV  200 mg Intravenous Q24H  . insulin aspart  0-24 Units Subcutaneous Q4H  . insulin glargine  12 Units Subcutaneous QHS  . pantoprazole  40 mg Oral BID  . sildenafil  40 mg Per Tube TID  . sodium chloride  10 mL Intravenous Q12H  . spironolactone  12.5 mg Oral Daily  . torsemide  20 mg Oral BID  . warfarin  2 mg Oral ONCE-1800  . Warfarin - Pharmacist Dosing Inpatient   Does not apply q1800    Infusions: . sodium chloride    . sodium chloride 250 mL (01/08/13 2157)  . DOBUTamine 4 mcg/kg/min (01/09/13 0433)    PRN Medications: acetaminophen (TYLENOL) oral liquid 160 mg/5 mL, ALPRAZolam, levalbuterol, ondansetron (ZOFRAN) IV, oxyCODONE-acetaminophen, potassium chloride, sodium chloride, sodium chloride, traMADol   Assessment:   1. Acute on chronic systolic HF with biventricular HF EF 10%  --S/p HM II VAD with TV repair 12/18/12  --s/p RVAD. - explanted 8/25 2. 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 Cardiogenic shock  4. VT s/p Biotronik ICD on amio 5. Chronic renal failure, stage III 6. 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 ABL anemia  8. PNA 9. Severe protein calorie malnutrition - on TPN 10. Hyponatremia/Hypokalemia 11. 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 Acute respiratory failure  Plan/Discussion:    Improving steadily. Will continue dobutamine for now. Check co-ox. Can likely start doubtamine wean tomorrow  Diuresing well with torsemide and spiro. Will continue  She is weak. Will need aggressive PT and education  PNA; Afebrile, WBCs falling. Off Vanc. Continue fluconazole for 7 days.  Pull Foley.   INR supratherapeutic. Pharmacy to dose coumadin.  On ASA.  LDH and hemoglobin stable.   Daniel Bensimhon,MD 8:45 AM Length of  Stay: 32 VAD Team Pager [phone] (7am - 7am) VAD Issue Non-VAD issues HF Pager [phone]

## 2013-01-09 NOTE — Progress Notes (Signed)
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    PT Comments   Kaitlin [name] is progressing very well with mobility with less assist for transfers and gait and increased distance. Pt continues to have difficulty recalling all items necessary for mobility and how to hook them up as well as decreased dexterity to complete but improving with this function. Kaitlin [name] and dgtr present end of session for dressing change.   Follow Up Recommendations        Does the patient have the potential to tolerate intense rehabilitation     Barriers to Discharge        Equipment Recommendations       Recommendations for Other Services    Frequency     Progress towards PT Goals Progress towards PT goals: Goals met and updated - see care plan  Plan Current plan remains appropriate    Precautions / Restrictions Precautions Precautions: Sternal;Fall Precaution Comments: LVAD   Pertinent Vitals/Pain Maintained 94-97% on 4L with activity and 98% at rest on 2L HR 93-108 No pain    Mobility  Bed Mobility Bed Mobility: Sit to Sidelying Right Sit to Sidelying Right: 4: Min assist;HOB flat Details for Bed Mobility Assistance: cueing for bil UE placement and sequence Transfers Sit to Stand: 4: Min  guard;From bed;Other (comment) (from rollator) Stand to Sit: 4: Min guard;To bed;Other (comment) (to rollator) Details for Transfer Assistance: cueing for hand placement, locking rollator brakes and safety with sitting, decreased control with descent to rollator due to fatigue Ambulation/Gait Ambulation/Gait Assistance: 4: Min assist Ambulation Distance (Feet): 75 Feet (x 2 with seated rest) Assistive device: Rollator Ambulation/Gait Assistance Details: Pt progressing well with mobility able to complete total 150' with seated rest. 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     Subjective Data      Cognition  Cognition Arousal/Alertness: Awake/alert Behavior During Therapy: [facility] for tasks assessed/performed Overall  Cognitive Status: Within Functional Limits for tasks assessed    Balance     End of Session PT - End of Session Equipment Utilized During Treatment: Oxygen Activity Tolerance: Patient tolerated treatment  well Patient left: in bed;with call bell/phone within reach;with nursing/sitter in room;with family/visitor present Nurse Communication: Mobility status   GP     Toney Sang Beth 01/09/2013, 1:29 PM [name], PT [phone]

## 2013-01-09 NOTE — Op Note (Signed)
PROCEDURE-- R subclavian central line placement Indications- access for iv dobutamine Surgeon- Zenaida Niece Trigt Anesthesia- local 1 % lidocaine Surgical Diagnoiss- S/P Heartmate 2 LVAD and emergent temporary RVAD  The old line was removed The skin was prepped and draped over the R chest Local 1 % lidocaine was infiltrated Using the Seldinger technique the R subclavian vein was cannulated and a    Guidewire passed easily The dilator then catheter was passed over the wire Blood was aspirated and the 3 lumens were flushed   The catheter was sutured to the skin and sterile derssing placed  -  Chest Xray is pending No complications noted

## 2013-01-09 NOTE — Progress Notes (Signed)
Discussed technique of donning sterile glovers with pt's daughter using Teach Back Method. Daughter able to don sterile gloves appropriately on two occasions.  Will continue to assess learning needs as pt progresses.

## 2013-01-09 NOTE — Progress Notes (Signed)
Abdominal packing changed. Minimal serosanguinous drainage noted on overlying 4x4's and packed 2x2's. Sterile saline was used to loosen the packed gauze. The sites were pink with areas of opaque noted.  One 2x2 was moistened with sterile saline and packed into each wound.  Overlying 4x4's placed on area to catch wicked drainage. Will continue to monitor pt closely.

## 2013-01-10 LAB — BASIC METABOLIC PANEL
BUN: 28 mg/dL — ABNORMAL HIGH (ref 6–23)
CO2: 33 mEq/L — ABNORMAL HIGH (ref 19–32)
Calcium: 9.3 mg/dL (ref 8.4–10.5)
Chloride: 95 mEq/L — ABNORMAL LOW (ref 96–112)
Creatinine, Ser: 1.08 mg/dL (ref 0.50–1.10)
GFR calc Af Amer: 61 mL/min — ABNORMAL LOW (ref >90)
GFR calc non Af Amer: 53 mL/min — ABNORMAL LOW (ref >90)
Glucose, Bld: 92 mg/dL (ref 70–99)
Potassium: 3.1 mEq/L — ABNORMAL LOW (ref 3.5–5.1)
Sodium: 138 mEq/L (ref 135–145)

## 2013-01-10 LAB — CBC
HCT: 26.8 % — ABNORMAL LOW (ref 36.0–46.0)
Hemoglobin: 8.2 g/dL — ABNORMAL LOW (ref 12.0–15.0)
MCH: 28.2 pg (ref 26.0–34.0)
MCHC: 30.6 g/dL (ref 30.0–36.0)
MCV: 92.1 fL (ref 78.0–100.0)
Platelets: 430 10*3/uL — ABNORMAL HIGH (ref 150–400)
RBC: 2.91 MIL/uL — ABNORMAL LOW (ref 3.87–5.11)
RDW: 16.3 % — ABNORMAL HIGH (ref 11.5–15.5)
WBC: 14.4 10*3/uL — ABNORMAL HIGH (ref 4.0–10.5)

## 2013-01-10 LAB — PROTIME-INR
INR: 2.17 — ABNORMAL HIGH (ref 0.00–1.49)
Prothrombin Time: 23.5 seconds — ABNORMAL HIGH (ref 11.6–15.2)

## 2013-01-10 LAB — GLUCOSE, CAPILLARY
Glucose-Capillary: 118 mg/dL — ABNORMAL HIGH (ref 70–99)
Glucose-Capillary: 118 mg/dL — ABNORMAL HIGH (ref 70–99)

## 2013-01-10 LAB — CARBOXYHEMOGLOBIN
Carboxyhemoglobin: 2.2 % — ABNORMAL HIGH (ref 0.5–1.5)
O2 Saturation: 96 %
Total hemoglobin: 8.2 g/dL — ABNORMAL LOW (ref 12.0–16.0)

## 2013-01-10 LAB — CULTURE, BLOOD (SINGLE): Culture: NO GROWTH

## 2013-01-10 MED ORDER — POTASSIUM CHLORIDE CRYS ER 20 MEQ PO TBCR
40.0000 meq | EXTENDED_RELEASE_TABLET | Freq: Every day | ORAL | Status: DC
  Administered 2013-01-10 – 2013-01-14 (×5): 40 meq via ORAL
  Filled 2013-01-10 (×6): qty 2

## 2013-01-10 MED ORDER — POTASSIUM CHLORIDE 10 MEQ/50ML IV SOLN
10.0000 meq | INTRAVENOUS | Status: AC
  Administered 2013-01-10 (×2): 10 meq via INTRAVENOUS
  Filled 2013-01-10 (×2): qty 50

## 2013-01-10 MED ORDER — WARFARIN SODIUM 4 MG PO TABS
4.0000 mg | ORAL_TABLET | Freq: Once | ORAL | Status: AC
  Administered 2013-01-10: 4 mg via ORAL
  Filled 2013-01-10: qty 1

## 2013-01-10 MED ORDER — SPIRONOLACTONE 25 MG PO TABS
25.0000 mg | ORAL_TABLET | Freq: Every day | ORAL | Status: DC
  Administered 2013-01-11 – 2013-01-18 (×8): 25 mg via ORAL
  Filled 2013-01-10 (×8): qty 1

## 2013-01-10 NOTE — Progress Notes (Signed)
. HeartMate 2 Rounding Note  Subjective:   Postop day #23   HeartMate 2 implantation with subsequent emergency centri- mag RVAD because of  RV dysfunction. Patient  had  stable hemodynamics on BiVAD support.Returned to OR 8-25 for successful removal of RVAD   Responsive but comfortable with IV sedation.  Had successful delayed sternal closure  8-28-014  Patient continues to have stable hemodynamics after chest closure Continuing low-dose dobutamine but will start weaning 1 mcg daily  Patient now in step down and had uneventful night. No PI events. Mean arterial blood pressure and heart rhythm stable. No fever.  INR is reduced at 2.1 so will remove epicardial pacing wires today  Patient appears stronger and more responsive today and eager start education regarding her LVAD   A postop, postextubation 2-D echocardiogram  on 9-2 shows significant improvement in RV function and decrease in tricuspid regurgitation. Repeat 2-D echo  at higher pump speed 9600 RPM indicates good midline position of the interventricular septum, minimal opening of the aortic valve, persistent improvement of the RV    Patient being followed by nutritionist for low albumin, on supplemental protein feedings. Tolerating a dysphagia 3 diet.  UGI bleed from M-W tear- now inactive, on protonix  On coumadin , ASA with goal INR 2.0-2.5 LDH levels are being followed serially and are satisfactory   diuresis for fluid overload- on oral Demadex  WBC >30k> 25>20>18 with procalcitonin 1.3>1.0 on Vanc, Primaxin and short course of IV Diflucan. Cultures neg , vancomycin Primaxin been stopped. Diflucan will be treated for one week. All chest tube sites are being packed with wet-to-dry 2 x 2 gauze with slight drainage. Main sternal incision clean and dry. Driveline site clean and dry    daily VAD drive line dressing changed daily  LVAD INTERROGATION:  HeartMate II LVAD:  Flow 4.5 liters/min, speed 8600 power 3.2, PI 4.7.    Objective:    Vital Signs:   Temp:  [98.7 F (37.1 C)] 98.7 F (37.1 C) (09/09 2024) Pulse Rate:  [93-94] 94 (09/10 1433) BP: (90)/(1) 90/1 mmHg (09/10 1433) SpO2:  [96 %] 96 % (09/09 2000) Weight:  [173 lb 11.6 oz (78.8 kg)] 173 lb 11.6 oz (78.8 kg) (09/10 0437) Last BM Date: 01/07/13 Mean arterial Pressure 84  Intake/Output:   Intake/Output Summary (Last 24 hours) at 01/10/13 1521 Last data filed at 01/10/13 1423  Gross per 24 hour  Intake    260 ml  Output   1150 ml  Net   -890 ml     Physical Exam: General: extubated, responsive, comfortable HEENT: normal Neck: supple. JVP normal  No lymphadenopathy or thryomegaly appreciated. Cor: Mechanical heart sounds with LVAD hum present. Lungs: clear Abdomen: soft, nontender, nondistended. No hepatosplenomegaly. No bruits or masses. Good bowel sounds. Extremities: no cyanosis, clubbing, rash,  Sig edema remains  extremities warm Neuro: moves all 4 extremities  Telemetry: sinus 94/min  Labs: Basic Metabolic Panel:  Recent Labs Lab 01/05/13 0400 01/05/13 1700 01/06/13 0440 01/07/13 0416 01/07/13 2210 01/08/13 0430 01/10/13 0500  NA 145 143 144 141  --  135 138  K 3.4* 3.4* 4.0 2.9* 3.1* 3.7 3.1*  CL 104  --  104 100  --  93* 95*  CO2 33*  --  31 32  --  33* 33*  GLUCOSE 150* 113* 81 86  --  97 92  BUN 46*  --  42* 38*  --  35* 28*  CREATININE 1.02  --  1.16* 1.23*  --  1.20* 1.08  CALCIUM 9.4  --  9.5 9.1  --  9.3 9.3    Liver Function Tests:  Recent Labs Lab 01/04/13 0420 01/05/13 0400 01/06/13 0440 01/07/13 0416 01/08/13 0430  AST 32 34 40* 36 42*  ALT 25 27 27 25 27   ALKPHOS 143* 136* 137* 125* 131*  BILITOT 0.9 0.9 0.9 0.9 1.0  PROT 5.7* 5.5* 5.7* 5.5* 5.9*  ALBUMIN 2.7* 2.7* 2.7* 2.6* 2.7*   No results found for this basename: LIPASE, AMYLASE,  in the last 168 hours No results found for this basename: AMMONIA,  in the last 168 hours  CBC:  Recent Labs Lab 01/05/13 0400 01/05/13 1700  01/06/13 0440 01/07/13 0416 01/08/13 0430 01/10/13 0500  WBC 19.0*  --  18.8* 16.8* 16.4* 14.4*  HGB 8.4* 8.8* 8.1* 8.1* 8.5* 8.2*  HCT 27.0* 26.0* 25.6* 25.5* 27.5* 26.8*  MCV 93.8  --  93.1 92.4 92.3 92.1  PLT 579*  --  533* 493* 487* 430*    INR:  Recent Labs Lab 01/06/13 0440 01/07/13 0416 01/08/13 0430 01/09/13 0500 01/10/13 0500  INR 3.11* 2.93* 3.50* 3.03* 2.17*    Other results:  EKG:   Imaging: Dg Chest 2 View  01/09/2013   *RADIOLOGY REPORT*  Clinical Data: Ventricular assist device  CHEST - 2 VIEW  Comparison: 01/06/2013  Findings: Stable right subclavian vein central venous catheter and left subclavian AICD device.  Stable cardiomegaly.  Bilateral central and basilar airspace opacities improved.  Ventricular assist device is stable in position in the left upper quadrant.  No pneumothorax.  IMPRESSION: Improving bilateral airspace disease.   Original Report Authenticated By: Jolaine Click, M.D.   Dg Chest Port 1 View  01/09/2013   *RADIOLOGY REPORT*  Clinical Data: Status post right central line placement  PORTABLE CHEST - 1 VIEW  Comparison: 01/09/2013 06:51 hours  Findings: Postsurgical changes are again seen.  A right-sided central venous line is noted with catheter tip in the low right atrium.  The overall appearance is stable.  No pneumothorax is seen.  Patchy infiltrate is again noted within the right lung.  A defibrillator and left ventricular assist device are again seen and stable.  IMPRESSION: No change from prior exam.   Original Report Authenticated By: Alcide Clever, M.D.     Medications:     Scheduled Medications: . amiodarone  200 mg Per NG tube BID  . antiseptic oral rinse  15 mL Mouth Rinse BID  . aspirin EC  325 mg Oral Daily  . citalopram  20 mg Oral Daily  . digoxin  0.125 mg Oral Daily  . feeding supplement  1 Container Oral TID WC  . feeding supplement  237 mL Oral Q24H  . ferrous fumarate-b12-vitamic C-folic acid  1 capsule Oral TID PC  .  insulin aspart  0-24 Units Subcutaneous TID AC & HS  . insulin glargine  12 Units Subcutaneous QHS  . pantoprazole  40 mg Oral BID  . potassium chloride  10 mEq Intravenous Q1 Hr x 2  . sildenafil  40 mg Per Tube TID  . sodium chloride  10 mL Intravenous Q12H  . spironolactone  12.5 mg Oral Daily  . torsemide  20 mg Oral BID  . warfarin  4 mg Oral ONCE-1800  . warfarin   Does not apply Once  . Warfarin - Pharmacist Dosing Inpatient   Does not apply q1800    Infusions: . sodium chloride    . sodium chloride 250 mL (  01/08/13 2157)  . DOBUTamine 4 mcg/kg/min (01/09/13 0433)    PRN Medications: acetaminophen (TYLENOL) oral liquid 160 mg/5 mL, ALPRAZolam, ondansetron (ZOFRAN) IV, oxyCODONE-acetaminophen, potassium chloride, sodium chloride, sodium chloride, traMADol   Assessment:  1 status post implantable LVAD with subsequent severe RV dysfunction requiring right ventricular assist device- now removed after 1 week support 2 coagulopathy and bleeding requiring transfusion now improved 3 UGI bleeding- M-W tear, treated 4 Chest  Closure successful 5 Deconditioning- starting to walk with PT Plan/Discussion:    Plan  Remove every other sternal skin suture-accomplished Slow wean of IV dobutamine.. 1 mcg reduction today  LVAD education for the patient and family    I reviewed the LVAD parameters from today, and compared the results to the patient's prior recorded data.  No programming changes were made.  The LVAD is functioning within specified parameters.  The patient performs LVAD self-test daily.  LVAD interrogation was negative for any significant power changes, alarms or PI events/speed drops.  LVAD equipment check completed and is in good working order.  Back-up equipment present.   LVAD education done on emergency procedures and precautions and reviewed exit site care.  Length of Stay: 33  [name] III,PETER 01/10/2013, 3:21 PM

## 2013-01-10 NOTE — Progress Notes (Signed)
CARDIAC REHAB PHASE I   PRE:  Rate/Rhythm: 92 SR 1 deg HB    BP: lying 92    SaO2: 96 2L  MODE:  Ambulation: 150 ft   POST:  Rate/Rhythm: 115 ST    BP: sitting 84     SaO2: 97 3L  Pt slightly nervous from this am's fall but willing to walk. Needed reminders on sternal precautions getting to EOB. Seemed stronger in dexterity changing to batteries but continues to need more education (thought cable was connected when alarm still beeping). Stood with min assist and able to walk. Assist x2 for safety, used RW and 3L O2. x4 rest stops standing, did not need to sit. Tired and thirsty on return to room. BP lower. Will continue to follow. To bed after walk.  [id number]   Elissa Lovett [place] CES, ACSM 01/10/2013 3:33 PM

## 2013-01-10 NOTE — Progress Notes (Signed)
Supervised daughter with LVAD dressing change using sterile technique. Needs reinforcement teaching but she is eager to learn and will get better with time. Plans to come tomorrow to change dressing. Insertion site unremarkable with no s/s of infection. Patient tolerated well. Remains in bed. Call bell near.Mamie Levers

## 2013-01-10 NOTE — Progress Notes (Signed)
Occupational Therapy Treatment Patient Details Name: Kaitlin [name] MRN: [id number] DOB: Dec 23, 1945 Today's Date: 01/10/2013 Time: 0102-7253 OT Time Calculation (min): 43 min  OT Assessment / Plan / Recommendation  History of present illness 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    OT comments  Pt making progress with LVAD set-up and starting hand exercises to increase grip for LVAD set-up  Follow Up Recommendations  CIR       Equipment Recommendations  3 in 1 bedside comode;Tub/shower bench    Recommendations for Other Services Rehab consult  Frequency Min 3X/week   Progress towards OT Goals Progress towards OT goals: Progressing toward goals  Plan Discharge plan remains appropriate    Precautions / Restrictions Precautions Precautions: Sternal;Fall Precaution Comments: LVAD       ADL  Lower Body Dressing: Supervision/safety (doff/donn socks crossing one leg over the other) Where Assessed - Lower Body Dressing: Unsupported sitting ADL Comments: Pt intructed putting on vest, attaching batteries to clips and switching to battery from module. Pt was able to tell me that what she needed first was her batteries and connectors. I handed them to her and she tested and connected them to holders independently matching up the arrows. She then needed Mod A to don vest while seated in recliner. Pt was able to tell me which part of the the wires were connected to her; however when she disconnected the first  one she tried to reconnect the wrong end to the battery and needed A to problem solve she had the wrong end. She was successful unscrewing the white one by noting the direction that the arrow was pointing for unlocking it; however she needed mod A for the black end--she was not paying attention to the unlock symbol and she was trying to unscrew the part that did not move.   Pt needed mod A to correctly place the batteries in the holsters.      OT Goals(current goals can now be found in the care plan section)    Visit Information  Last OT Received On: 01/10/13 Assistance Needed: +1 History of Present Illness: 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           Cognition  Cognition Arousal/Alertness: Awake/alert Behavior During Therapy: [facility] for tasks assessed/performed Overall Cognitive Status: Impaired/Different from baseline Area of Impairment: Memory Memory: Decreased short-term memory General Comments: Decreased recall for all the steps for LVAD system when getting ready to change from primary power source to battery source       Exercises  Other Exercises Other Exercises: Bil hand yellow theraputty exercises for grip (  10 reps), pinch and pull (10 reps), and between finger adduction (5 reps); Hand exerciser for fingers and thumb--#10 and 10 reps. 3x/day. Pt min A      End of Session OT - End of Session Equipment Utilized During Treatment: Oxygen Activity Tolerance: Patient tolerated treatment well Patient  left: in chair;with call bell/phone within reach (PT in room)       Evette Georges [phone] 01/10/2013, 12:33 PM

## 2013-01-10 NOTE — Progress Notes (Signed)
Physical Therapy Treatment Patient Details Name: Kaitlin [name] MRN: [id number] DOB: Jul 05, 1945 Today's Date: 01/10/2013 Time: 7829-5621 PT Time Calculation (min): 33 min  PT Assessment / Plan / Recommendation  History of Present Illness Ms. 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    PT Comments   Patient demonstrates increased fatigue today. During attempt to stand for ambulation, patient bilateral LEs became weak and patient was unable to maintain standing. Patient was controlled and lower gently to the floor. After a minute to calm and ensure patient okay, patient was then assisted back to chair. Nsg aware. LVAD coordinator aware. Patient then able to complete therapy session with education. Improvements noted with conversion but patient still requires cues at times for sequencing to convert back to power source. Patient assessed for pain, reports no pain. Nsg updated. Ambulation held and focus of session on conversion back to power and strength and palpation assessment of BLE to ensure no injury.  Will continue to see as indicated and progress as tolerated.  Follow Up Recommendations  CIR     Does the patient have the potential to tolerate intense rehabilitation     Barriers to Discharge        Equipment Recommendations   (ROLLATOR WALKER WITH SEAT)    Recommendations for Other Services OT consult;Rehab consult  Frequency Min 5X/week   Progress towards PT Goals Progress towards PT goals: Progressing toward  goals  Plan Current plan remains appropriate    Precautions / Restrictions Precautions Precautions: Sternal;Fall Precaution Comments: LVAD Required Braces or Orthoses:  (LVAD equipment and bag)   Pertinent Vitals/Pain No pain, VSS    Mobility  Transfers Transfers: Sit to Stand;Stand to Sit Sit to Stand: 4: Min assist;2: Max assist;Other (comment) Stand to Sit: 4: Min assist;2: Max assist;Other (comment) Details for Transfer Assistance: See clinical statement Ambulation/Gait Ambulation/Gait Assistance: Not tested (comment)      Visit Information  Last PT Received On: 01/10/13 Assistance Needed: +1 History of Present Illness: Ms. 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        Cognition  Cognition Arousal/Alertness: Awake/alert Behavior During Therapy: [facility] for tasks assessed/performed Overall Cognitive Status: Impaired/Different from baseline Area of Impairment: Memory Memory: Decreased short-term memory General Comments: Decreased recall for all the steps for LVAD system when getting ready to change from primary power source to battery source       End of Session PT - End of Session Equipment Utilized During Treatment: Oxygen Activity Tolerance: Patient tolerated treatment well Patient left: in chair;with call bell/phone within reach;with nursing/sitter in room Nurse Communication: Mobility status   GP  Fabio Asa 01/10/2013, 4:50 PM Charlotte Crumb, PT DPT  [phone]

## 2013-01-10 NOTE — Progress Notes (Signed)
Patient ID: Kaitlin [name], female   DOB: [birth year], 67 y.o.   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 EF 15-25% with mild to moderate RV dysfunction  8/18 Underwent HM II LVAD implant (under destination criteria) along with TV repair.  Taken back to the OR for RVAD support.  8/25 was taken back to OR for RVAD explant. 8/26 EGD which showed MW tear in esophagus. Injected and clipped. No further bleeding.  8/27  Back to OR for chest closure. 8/31 extubated  Feeling better. Working with PT. Apparently had a fall earlier today. Weight down another pound. Nearing euvolemia. Hgb trending down. Dobutamine weaned to 3 today  LVAD INTERROGATION:   HeartMate II LVAD:  Flow 4.2  Liters/min  speed 8600  power 5.0 PI 6.3  No PI events  Objective:    Vital Signs:   Temp:  [98.7 F (37.1 C)] 98.7 F (37.1 C) (09/09 2024) Pulse Rate:  [93-94] 94 (09/10 1433) BP: (90)/(1) 90/1 mmHg (09/10 1433) SpO2:  [96 %] 96 % (09/09 2000) Weight:  [78.8 kg (173 lb 11.6 oz)] 78.8 kg (173 lb 11.6 oz) (09/10 0437) Last BM Date: 01/07/13 Mean arterial Pressure 80s  Intake/Output:   Intake/Output Summary (Last 24 hours) at 01/10/13 1612 Last data filed at 01/10/13 1423  Gross per 24 hour  Intake    260 ml  Output   1150 ml  Net   -890 ml     Physical Exam: General: lying in chair HEENT: normal Neck: supple. JVP 10 Carotids 2+ bilat; no bruits. No lymphadenopathy or thryomegaly appreciated.  Cor: LVAD hum present.;Lungs: course Abdomen: soft, nontender, normal BS Driveline: C/D/I; securement device intact  Extremities: no cyanosis, clubbing, rash, tr edema.  Neuro: awake follows commands moves all 4 extremities w/o difficulty. Affect pleasant  Telemetry: NSR 90s  Labs: Basic Metabolic Panel:  Recent Labs Lab 01/05/13 0400 01/05/13 1700 01/06/13 0440 01/07/13 0416  01/07/13 2210 01/08/13 0430 01/10/13 0500  NA 145 143 144 141  --  135 138  K 3.4* 3.4* 4.0 2.9* 3.1* 3.7 3.1*  CL 104  --  104 100  --  93* 95*  CO2 33*  --  31 32  --  33* 33*  GLUCOSE 150* 113* 81 86  --  97 92  BUN 46*  --  42* 38*  --  35* 28*  CREATININE 1.02  --  1.16* 1.23*  --  1.20* 1.08  CALCIUM 9.4  --  9.5 9.1  --  9.3 9.3    Liver Function Tests:  Recent Labs Lab 01/04/13 0420 01/05/13 0400 01/06/13 0440 01/07/13 0416 01/08/13 0430  AST 32 34 40* 36 42*  ALT 25 27 27 25 27   ALKPHOS 143* 136* 137* 125* 131*  BILITOT 0.9 0.9 0.9 0.9 1.0  PROT 5.7* 5.5* 5.7* 5.5* 5.9*  ALBUMIN 2.7* 2.7* 2.7* 2.6* 2.7*   No results found for this basename: LIPASE, AMYLASE,  in the last 168 hours No results found for this basename: AMMONIA,  in the last 168 hours  CBC:  Recent Labs Lab 01/05/13 0400 01/05/13 1700 01/06/13 0440 01/07/13 0416 01/08/13 0430 01/10/13 0500  WBC 19.0*  --  18.8* 16.8* 16.4* 14.4*  HGB 8.4* 8.8* 8.1* 8.1* 8.5* 8.2*  HCT 27.0* 26.0* 25.6* 25.5* 27.5* 26.8*  MCV 93.8  --  93.1 92.4 92.3 92.1  PLT 579*  --  533* 493* 487* 430*    INR:  Recent Labs Lab 01/06/13 0440 01/07/13 0416 01/08/13 0430 01/09/13 0500 01/10/13 0500  INR 3.11* 2.93* 3.50* 3.03* 2.17*    Imaging: Dg Chest 2 View  01/09/2013   *RADIOLOGY REPORT*  Clinical Data: Ventricular assist device  CHEST - 2 VIEW  Comparison: 01/06/2013  Findings: Stable right subclavian vein central venous catheter and left subclavian AICD device.  Stable cardiomegaly.  Bilateral central and basilar airspace opacities improved.  Ventricular assist device is stable in position in the left upper quadrant.  No pneumothorax.  IMPRESSION: Improving bilateral airspace disease.   Original Report Authenticated By: Jolaine Click, M.D.   Dg Chest Port 1 View  01/09/2013   *RADIOLOGY REPORT*  Clinical Data: Status post right central line placement  PORTABLE CHEST - 1 VIEW  Comparison: 01/09/2013 06:51 hours   Findings: Postsurgical changes are again seen.  A right-sided central venous line is noted with catheter tip in the low right atrium.  The overall appearance is stable.  No pneumothorax is seen.  Patchy infiltrate is again noted within the right lung.  A defibrillator and left ventricular assist device are again seen and stable.  IMPRESSION: No change from prior exam.   Original Report Authenticated By: Alcide Clever, M.D.     Medications:     Scheduled Medications: . amiodarone  200 mg Per NG tube BID  . antiseptic oral rinse  15 mL Mouth Rinse BID  . aspirin EC  325 mg Oral Daily  . citalopram  20 mg Oral Daily  . digoxin  0.125 mg Oral Daily  . feeding supplement  1 Container Oral TID WC  . feeding supplement  237 mL Oral Q24H  . ferrous fumarate-b12-vitamic C-folic acid  1 capsule Oral TID PC  . insulin aspart  0-24 Units Subcutaneous TID AC & HS  . insulin glargine  12 Units Subcutaneous QHS  . pantoprazole  40 mg Oral BID  . potassium chloride  10 mEq Intravenous Q1 Hr x 2  . sildenafil  40 mg Per Tube TID  . sodium chloride  10 mL Intravenous Q12H  . spironolactone  12.5 mg Oral Daily  . torsemide  20 mg Oral BID  . warfarin  4 mg Oral ONCE-1800  . warfarin   Does not apply Once  . Warfarin - Pharmacist Dosing Inpatient   Does not apply q1800    Infusions: . sodium chloride    . sodium chloride 250 mL (01/08/13 2157)  . DOBUTamine 4 mcg/kg/min (01/09/13 0433)    PRN Medications: acetaminophen (TYLENOL) oral liquid 160 mg/5 mL, ALPRAZolam, ondansetron (ZOFRAN) IV, oxyCODONE-acetaminophen, potassium chloride, sodium chloride, sodium chloride, traMADol   Assessment:   1. Acute on chronic systolic HF with biventricular HF EF 10%  --S/p HM II VAD with TV repair 12/18/12  --s/p RVAD. - explanted 8/25 2. 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 Cardiogenic shock  4. VT s/p Biotronik ICD on amio 5. Chronic renal failure, stage  III 6. 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 ABL anemia  8. PNA 9. Severe protein calorie malnutrition - on TPN 10. Hyponatremia/Hypokalemia 11. 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 Acute respiratory failure  Plan/Discussion:    Improving steadily. Agree with dobutamine.   Diuresing well with torsemide and spiro. Will continue  She is weak. Will need aggressive PT and education  PNA; Afebrile, WBCs  falling. Off Vanc and fluconazole. Will follow.    INR Therapeutic. Pharmacy to dose coumadin.  On ASA.  LDH stable.  May need a unit of RBCs soon. Limit blood draws.   [name] 4:12 PM Length of Stay: 33 VAD Team Pager [phone] (7am - 7am) VAD Issue Non-VAD issues HF Pager [phone]

## 2013-01-10 NOTE — Progress Notes (Signed)
Patient was assisted to bed. Wet to dry abdominal dressing changed per order. No drainage or  s/s of infection; patient tolerated well. EPW removed with ends intact per order/protocol. No bleeding or ectopy noted; patient tolerated well. Every other midline suture removed per order x13 sutures; patient tolerated well. Patient instructed to lay supine in bed x1hr; verbalized understanding. Patient instructed to notify RN of any s/s of distress. VSS. Call bell near. Will monitor.Kaitlin [name]

## 2013-01-11 DIAGNOSIS — Z95818 Presence of other cardiac implants and grafts: Secondary | ICD-10-CM

## 2013-01-11 DIAGNOSIS — I428 Other cardiomyopathies: Secondary | ICD-10-CM

## 2013-01-11 DIAGNOSIS — I5022 Chronic systolic (congestive) heart failure: Secondary | ICD-10-CM

## 2013-01-11 LAB — CBC
Hemoglobin: 8.2 g/dL — ABNORMAL LOW (ref 12.0–15.0)
MCH: 28.9 pg (ref 26.0–34.0)
MCHC: 31.5 g/dL (ref 30.0–36.0)
Platelets: 410 10*3/uL — ABNORMAL HIGH (ref 150–400)
RBC: 2.84 MIL/uL — ABNORMAL LOW (ref 3.87–5.11)

## 2013-01-11 LAB — BASIC METABOLIC PANEL
BUN: 22 mg/dL (ref 6–23)
Calcium: 9.3 mg/dL (ref 8.4–10.5)
GFR calc Af Amer: 65 mL/min — ABNORMAL LOW (ref >90)
GFR calc non Af Amer: 56 mL/min — ABNORMAL LOW (ref >90)
Potassium: 3.3 mEq/L — ABNORMAL LOW (ref 3.5–5.1)
Sodium: 137 mEq/L (ref 135–145)

## 2013-01-11 LAB — PROTIME-INR
INR: 2.4 — ABNORMAL HIGH (ref 0.00–1.49)
Prothrombin Time: 25.4 seconds — ABNORMAL HIGH (ref 11.6–15.2)

## 2013-01-11 LAB — GLUCOSE, CAPILLARY: Glucose-Capillary: 100 mg/dL — ABNORMAL HIGH (ref 70–99)

## 2013-01-11 MED ORDER — POTASSIUM CHLORIDE CRYS ER 20 MEQ PO TBCR
40.0000 meq | EXTENDED_RELEASE_TABLET | Freq: Once | ORAL | Status: AC
  Administered 2013-01-11: 40 meq via ORAL
  Filled 2013-01-11: qty 2

## 2013-01-11 MED ORDER — INFLUENZA VAC SPLIT QUAD 0.5 ML IM SUSP
0.5000 mL | INTRAMUSCULAR | Status: AC
  Filled 2013-01-11: qty 0.5

## 2013-01-11 MED ORDER — WARFARIN SODIUM 2 MG PO TABS
2.0000 mg | ORAL_TABLET | Freq: Once | ORAL | Status: AC
  Administered 2013-01-11: 2 mg via ORAL
  Filled 2013-01-11: qty 1

## 2013-01-11 MED ORDER — POTASSIUM CHLORIDE CRYS ER 20 MEQ PO TBCR: 40.0000 meq | EXTENDED_RELEASE_TABLET | Freq: Once | ORAL | Status: DC

## 2013-01-11 NOTE — Progress Notes (Signed)
Patient ID: Kaitlin [name], female   DOB: [birth year], 66 y.o.   MRN: [id number]  HeartMate 2 Rounding Note  Subjective:    Postop day #24 HeartMate 2 implantation with subsequent emergency centri- mag RVAD because of RV dysfunction. Patient had stable hemodynamics on BiVAD support.Returned to OR 8-25 for successful removal of RVAD Responsive but comfortable with IV sedation.  Had successful delayed sternal closure 8-28-014  Patient continues to have stable hemodynamics after chest closure  Continuing low-dose dobutamine. She is at 3 mcg. Will turn down to 2 mcg in am. Patient now in step down and had uneventful night. No PI events. Mean arterial blood pressure and heart rhythm stable. No fever.  INR is 2.4. She is ambulating but tires easily A postop, postextubation 2-D echocardiogram on 9-2 shows significant improvement in RV function and decrease in tricuspid regurgitation.  Repeat 2-D echo at higher pump speed 9600 RPM indicates good midline position of the interventricular septum, minimal opening of the aortic valve, persistent improvement of the RV  Patient being followed by nutritionist for low albumin, on supplemental protein feedings. Tolerating a dysphagia 3 diet.  UGI bleed from M-W tear- now inactive, on protonix  On coumadin , ASA with goal INR 2.0-2.5  LDH levels are being followed serially and are satisfactory  diuresis for fluid overload- on oral Demadex. She is only 2 lbs over preop. WBC >30k> 25>20>18>14.5 with procalcitonin 1.3>1.0 on Vanc, Primaxin and short course of IV Diflucan. Cultures neg , vancomycin Primaxin been stopped. Diflucan will be treated for one week. All chest tube sites are being packed with wet-to-dry 2 x 2 gauze with slight drainage. Main sternal incision clean and dry. Every other suture removed. Driveline site clean and dry  daily VAD drive line dressing changed daily     LVAD INTERROGATION:  HeartMate II LVAD:  Flow 4.3 liters/min, speed 8600,  power 5, PI 6.3.     Objective:    Vital Signs:   Temp:  [98.1 F (36.7 C)-98.8 F (37.1 C)] 98.3 F (36.8 C) (09/11 1234) Pulse Rate:  [78-94] 94 (09/11 1234) BP: (80)/(1) 80/1 mmHg (09/11 1234) SpO2:  [97 %-99 %] 99 % (09/11 0358) Weight:  [78.4 kg (172 lb 13.5 oz)] 78.4 kg (172 lb 13.5 oz) (09/11 0358) Last BM Date: 01/07/13 Mean arterial Pressure 78 this am, 90 this afternoon  Intake/Output:   Intake/Output Summary (Last 24 hours) at 01/11/13 1437 Last data filed at 01/11/13 0817  Gross per 24 hour  Intake    120 ml  Output    550 ml  Net   -430 ml     Physical Exam: General:  Well appearing. No resp difficulty HEENT: normal Neck: supple. JVP . Carotids 2+ bilat; no bruits. No lymphadenopathy or thryomegaly appreciated. Cor: LVAD hum present.  Lungs: clear Abdomen: soft, nontender, nondistended. No hepatosplenomegaly. No bruits or masses. Good bowel sounds. Driveline: C/D/I; securement device intact and driveline incorporated Extremities: mild ankle edema Neuro: alert & orientedx3, cranial nerves grossly intact. moves all 4 extremities w/o difficulty. Affect pleasant  Telemetry: sinus  Labs: Basic Metabolic Panel:  Recent Labs Lab 01/06/13 0440 01/07/13 0416 01/07/13 2210 01/08/13 0430 01/10/13 0500 01/11/13 0525  NA 144 141  --  135 138 137  K 4.0 2.9* 3.1* 3.7 3.1* 3.3*  CL 104 100  --  93* 95* 97  CO2 31 32  --  33* 33* 34*  GLUCOSE 81 86  --  97 92 103*  BUN 42*  38*  --  35* 28* 22  CREATININE 1.16* 1.23*  --  1.20* 1.08 1.03  CALCIUM 9.5 9.1  --  9.3 9.3 9.3    Liver Function Tests:  Recent Labs Lab 01/05/13 0400 01/06/13 0440 01/07/13 0416 01/08/13 0430  AST 34 40* 36 42*  ALT 27 27 25 27   ALKPHOS 136* 137* 125* 131*  BILITOT 0.9 0.9 0.9 1.0  PROT 5.5* 5.7* 5.5* 5.9*  ALBUMIN 2.7* 2.7* 2.6* 2.7*   No results found for this basename: LIPASE, AMYLASE,  in the last 168 hours No results found for this basename: AMMONIA,  in the last  168 hours  CBC:  Recent Labs Lab 01/06/13 0440 01/07/13 0416 01/08/13 0430 01/10/13 0500 01/11/13 0525  WBC 18.8* 16.8* 16.4* 14.4* 14.5*  HGB 8.1* 8.1* 8.5* 8.2* 8.2*  HCT 25.6* 25.5* 27.5* 26.8* 26.0*  MCV 93.1 92.4 92.3 92.1 91.5  PLT 533* 493* 487* 430* 410*    INR:  Recent Labs Lab 01/07/13 0416 01/08/13 0430 01/09/13 0500 01/10/13 0500 01/11/13 0525  INR 2.93* 3.50* 3.03* 2.17* 2.40*    Other results:  EKG:   Imaging: Dg Chest Port 1 View  01/09/2013   *RADIOLOGY REPORT*  Clinical Data: Status post right central line placement  PORTABLE CHEST - 1 VIEW  Comparison: 01/09/2013 06:51 hours  Findings: Postsurgical changes are again seen.  A right-sided central venous line is noted with catheter tip in the low right atrium.  The overall appearance is stable.  No pneumothorax is seen.  Patchy infiltrate is again noted within the right lung.  A defibrillator and left ventricular assist device are again seen and stable.  IMPRESSION: No change from prior exam.   Original Report Authenticated By: Alcide Clever, M.D.      Medications:     Scheduled Medications: . amiodarone  200 mg Per NG tube BID  . antiseptic oral rinse  15 mL Mouth Rinse BID  . aspirin EC  325 mg Oral Daily  . citalopram  20 mg Oral Daily  . digoxin  0.125 mg Oral Daily  . feeding supplement  1 Container Oral TID WC  . feeding supplement  237 mL Oral Q24H  . ferrous fumarate-b12-vitamic C-folic acid  1 capsule Oral TID PC  . [START ON 01/12/2013] influenza vac split quadrivalent PF  0.5 mL Intramuscular Tomorrow-1000  . insulin aspart  0-24 Units Subcutaneous TID AC & HS  . insulin glargine  12 Units Subcutaneous QHS  . pantoprazole  40 mg Oral BID  . potassium chloride  40 mEq Oral Daily  . sildenafil  40 mg Per Tube TID  . sodium chloride  10 mL Intravenous Q12H  . spironolactone  25 mg Oral Daily  . torsemide  20 mg Oral BID  . Warfarin - Pharmacist Dosing Inpatient   Does not apply q1800      Infusions: . sodium chloride    . sodium chloride 250 mL (01/08/13 2157)  . DOBUTamine 3 mcg/kg/min (01/10/13 1500)     PRN Medications:  acetaminophen (TYLENOL) oral liquid 160 mg/5 mL, ALPRAZolam, ondansetron (ZOFRAN) IV, oxyCODONE-acetaminophen, potassium chloride, sodium chloride, sodium chloride, traMADol   Assessment:  1 status post implantable LVAD with subsequent severe RV dysfunction requiring right ventricular assist device- now removed after 1 week support  2 coagulopathy and bleeding requiring transfusion now improved  3 UGI bleeding- M-W tear, treated  4 Chest Closure successful  5 Deconditioning- starting to walk with PT    Plan/Discussion:  Decrease dobutamine to 2 tomorrow am.  I reviewed the LVAD parameters from today, and compared the results to the patient's prior recorded data.  No programming changes were made.  The LVAD is functioning within specified parameters.  The patient performs LVAD self-test daily.  LVAD interrogation was negative for any significant power changes, alarms or PI events/speed drops.  LVAD equipment check completed and is in good working order.  Back-up equipment present.   LVAD education done on emergency procedures and precautions and reviewed exit site care.  Length of Stay: 34  Yemariam Magar K 01/11/2013, 2:37 PM  VAD Team Pager [phone] (7am - 7am)

## 2013-01-11 NOTE — Progress Notes (Signed)
Physical Therapy Treatment Patient Details Name: Kaitlin [name] MRN: [id number] DOB: 10-Nov-1945 Today's Date: 01/11/2013 Time: 4098-1191 PT Time Calculation (min): 30 min  PT Assessment / Plan / Recommendation  History of Present Illness Kaitlin [name] is a 67 y/o woman with h/o severe HTN, [name], 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    PT Comments   Patient demonstrates progress with ambulation and mobility today. Patient still having difficulty with standing transfers from the chair (lower surface) performed partial task training from various heights to strengthen LEs and work on technique for transfers, patient tolerated well. Patient required some assist with conversion back to power module but did demonstrate increased carryover from prior session. Will continue to work with patient and progress activity as tolerated.   Follow Up Recommendations  CIR           Equipment Recommendations   (ROLLATOR WALKER WITH SEAT)    Recommendations for Other Services OT consult;Rehab consult  Frequency Min 5X/week   Progress towards PT Goals Progress towards PT goals: Progressing toward goals  Plan Current plan remains appropriate    Precautions / Restrictions Precautions Precautions: Sternal;Fall Precaution Comments: LVAD Restrictions Weight Bearing Restrictions:  (Sternal precautions)   Pertinent Vitals/Pain Pt reports no pain today    Mobility  Bed Mobility Bed Mobility: Not assessed Transfers Transfers: Sit to Stand;Stand to Sit Sit to Stand: 4: Min  guard;3: Mod assist;From bed;From chair/3-in-1;Other (comment) (patient unable to get out of chair without increased assist) Stand to Sit: 4: Min guard;To bed;To chair/3-in-1 Details for Transfer Assistance: VCs for technique, positioning and effort.  Ambulation/Gait Ambulation/Gait Assistance: 4: Min assist Ambulation Distance (Feet): 160 Feet Assistive device: Rollator (3 standing rest breaks) Ambulation/Gait Assistance Details: progressing with ambulation, good stability and awareness of needed breaks Gait Pattern: Step-through pattern;Decreased stride length;Trunk flexed;Narrow base of support Gait velocity: decreased Stairs: No    Exercises Other Exercises Other Exercises: Part task transfer exercises, 10 transfers from varying heights sit to stand from bed. Initially bed elevated to higher height and progressively lowered with each transfer. Patient demonstrates some difficulty with transfer from lowest position.      PT Goals (current goals can now be found in the care plan section) Acute Rehab PT Goals PT Goal Formulation: With patient Time For Goal Achievement: 01/16/13 Potential to Achieve Goals: Good  Visit Information  Last PT Received On: 01/11/13 Assistance Needed: +1 History of Present Illness: Kaitlin [name] is a 67 y/o woman with h/o severe HTN, [name], 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     Subjective Data  Subjective: I feel pretty good today   Cognition  Cognition Arousal/Alertness: Awake/alert Behavior During Therapy: WFL for tasks  assessed/performed Overall Cognitive Status: Within Functional Limits for tasks  assessed General Comments: improved recall of management of device today compared to prior session    Balance  Static Standing Balance Static Standing - Balance Support: Bilateral upper extremity supported;During functional activity Static Standing - Level of Assistance: 5: Stand by assistance Static Standing - Comment/# of Minutes: 6 minutes  End of Session PT - End of Session Equipment Utilized During Treatment: Oxygen Activity Tolerance: Patient tolerated treatment well Patient left: in chair;with call bell/phone within reach;with nursing/sitter in room Nurse Communication: Mobility status   GP     Fabio Asa 01/11/2013, 12:12 PM Charlotte Crumb, PT DPT  [phone]

## 2013-01-11 NOTE — Progress Notes (Signed)
Mid abdominal wet-dry dressing changed per order. Tissue pink with no s/s of infection. Patient tolerated well. Assisted to bedside commode. Returned to bed. Call bell near.Kaitlin [name]

## 2013-01-11 NOTE — Progress Notes (Signed)
ANTICOAGULATION CONSULT NOTE - Follow Up Consult  Pharmacy Consult for Coumadin Indication: LVAD  No Known Allergies  Patient Measurements: Height: 5\' 9"  (175.3 cm) Weight: 172 lb 13.5 oz (78.4 kg) IBW/kg (Calculated) : 66.2  Vital Signs: Temp: 98.3 F (36.8 C) (09/11 1234) Temp src: Oral (09/11 1234) BP: 80/1 mmHg (09/11 1234) Pulse Rate: 94 (09/11 1234)  Labs:  Recent Labs  01/09/13 0500 01/10/13 0500 01/11/13 0525  HGB  --  8.2* 8.2*  HCT  --  26.8* 26.0*  PLT  --  430* 410*  LABPROT 30.3* 23.5* 25.4*  INR 3.03* 2.17* 2.40*  CREATININE  --  1.08 1.03    Estimated Creatinine Clearance: 56.9 ml/min (by C-G formula based on Cr of 1.03).  Assessment: 67yof s/p LVAD and tricuspid valve repair on 8/18. Coumadin dosing changed to per pharmacy on 9/5.  INR has been labile likely due to fluconazole/amiodarone. Fluconazole stopped.  No bleeding issues noted and cbc has remained stable.  Regular diet resumed with supplements.  INR 2.4   Vancomycin and primaxin have been stopped. No fevers noted and wbc has trended down to 16.  Goal of Therapy:  INR 2-2.5 Monitor platelets by anticoagulation protocol: Yes   Plan:  1) warfarin 2mg  tonight 2) INR daily  Leota Sauers Pharm.D. CPP, BCPS Clinical Pharmacist [phone] 01/11/2013 3:03 PM

## 2013-01-11 NOTE — Progress Notes (Signed)
CARDIAC REHAB PHASE I   PRE:  Rate/Rhythm: 90 SR    BP: sitting 84    SaO2: 95 2L  MODE:  Ambulation: 150 ft   POST:  Rate/Rhythm: 107    BP: sitting 90     SaO2: 95 2L  Pt tired but willing. Easier for her to stand with pillows in chair. Rest x3 walking around circle with rollator, 2L O2 and assist x2. Min assist needed walking. To bed. Daughter practiced switching from power to batteries and back to power module. Did well.  [id number]  Kaitlin [name] [place] CES, ACSM 01/11/2013 3:11 PM

## 2013-01-11 NOTE — Progress Notes (Signed)
Chaplain provided emotional support, hospitality and a ministry of presence to the patient. Chaplain engaged in conversation and provided a listening ear to the patient and her daughter. Chaplain provided supportive and sustaining care for the patient.   01/11/13 1500  Clinical Encounter Type  Visited With Patient and family together  Visit Type Initial;Spiritual support;Social support  Referral From Palliative care team  Spiritual Encounters  Spiritual Needs Emotional  Stress Factors  Patient Stress Factors Health changes  Family Stress Factors None identified

## 2013-01-11 NOTE — Progress Notes (Addendum)
Patient ID: DELCIA SPITZLEY, female   DOB: 05-11-1945, 67 y.o.   MRN: [id number] HeartMate 2 Rounding Note  Subjective:    Ms. 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 EF 15-25% with mild to moderate RV dysfunction  8/18 Underwent HM II LVAD implant (under destination criteria) along with TV repair.  Taken back to the OR for RVAD support.  8/25 was taken back to OR for RVAD explant. 8/26 EGD which showed MW tear in esophagus. Injected and clipped. No further bleeding.  8/27  Back to OR for chest closure. 8/31 extubated  Overall feeling much better.  Working with PT. Weight down another pound. Hgb trending down but unchanged from yesterday. Yesterday Dobutamine weaned to 3 mcg. Ambulated 150 feet with assistance.     LVAD INTERROGATION:   HeartMate II LVAD:  Flow 4.7  Liters/min  speed 8600  power 4.7 PI 5.6  No PI events  Objective:    Vital Signs:   Temp:  [98.1 F (36.7 C)-98.8 F (37.1 C)] 98.3 F (36.8 C) (09/11 1234) Pulse Rate:  [78-94] 94 (09/11 1234) BP: (80)/(1) 80/1 mmHg (09/11 1234) SpO2:  [97 %-99 %] 99 % (09/11 0358) Weight:  [172 lb 13.5 oz (78.4 kg)] 172 lb 13.5 oz (78.4 kg) (09/11 0358) Last BM Date: 01/07/13 Mean arterial Pressure 80s  Intake/Output:   Intake/Output Summary (Last 24 hours) at 01/11/13 1442 Last data filed at 01/11/13 0817  Gross per 24 hour  Intake    120 ml  Output    550 ml  Net   -430 ml     Physical Exam: MAP 80 General: lying in bed daughter at bedside HEENT: normal Neck: supple. JVP 7-8 Carotids 2+ bilat; no bruits. No lymphadenopathy or thryomegaly appreciated.  Cor: LVAD hum present. Lungs: CTA Abdomen: soft, nontender, normal BS Driveline: C/D/I; securement device intact  Extremities: no cyanosis, clubbing, rash, tr edema.  Neuro: awake follows commands moves all 4 extremities w/o difficulty. Affect pleasant  Telemetry: NSR 90s  Labs: Basic Metabolic  Panel:  Recent Labs Lab 01/06/13 0440 01/07/13 0416 01/07/13 2210 01/08/13 0430 01/10/13 0500 01/11/13 0525  NA 144 141  --  135 138 137  K 4.0 2.9* 3.1* 3.7 3.1* 3.3*  CL 104 100  --  93* 95* 97  CO2 31 32  --  33* 33* 34*  GLUCOSE 81 86  --  97 92 103*  BUN 42* 38*  --  35* 28* 22  CREATININE 1.16* 1.23*  --  1.20* 1.08 1.03  CALCIUM 9.5 9.1  --  9.3 9.3 9.3    Liver Function Tests:  Recent Labs Lab 01/05/13 0400 01/06/13 0440 01/07/13 0416 01/08/13 0430  AST 34 40* 36 42*  ALT 27 27 25 27   ALKPHOS 136* 137* 125* 131*  BILITOT 0.9 0.9 0.9 1.0  PROT 5.5* 5.7* 5.5* 5.9*  ALBUMIN 2.7* 2.7* 2.6* 2.7*   No results found for this basename: LIPASE, AMYLASE,  in the last 168 hours No results found for this basename: AMMONIA,  in the last 168 hours  CBC:  Recent Labs Lab 01/06/13 0440 01/07/13 0416 01/08/13 0430 01/10/13 0500 01/11/13 0525  WBC 18.8* 16.8* 16.4* 14.4* 14.5*  HGB 8.1* 8.1* 8.5* 8.2* 8.2*  HCT 25.6* 25.5* 27.5* 26.8* 26.0*  MCV 93.1 92.4 92.3 92.1 91.5  PLT 533* 493* 487* 430* 410*    INR:  Recent Labs Lab  01/07/13 0416 01/08/13 0430 01/09/13 0500 01/10/13 0500 01/11/13 0525  INR 2.93* 3.50* 3.03* 2.17* 2.40*    Imaging: Dg Chest Port 1 View  01/09/2013   *RADIOLOGY REPORT*  Clinical Data: Status post right central line placement  PORTABLE CHEST - 1 VIEW  Comparison: 01/09/2013 06:51 hours  Findings: Postsurgical changes are again seen.  A right-sided central venous line is noted with catheter tip in the low right atrium.  The overall appearance is stable.  No pneumothorax is seen.  Patchy infiltrate is again noted within the right lung.  A defibrillator and left ventricular assist device are again seen and stable.  IMPRESSION: No change from prior exam.   Original Report Authenticated By: Alcide Clever, M.D.     Medications:     Scheduled Medications: . amiodarone  200 mg Per NG tube BID  . antiseptic oral rinse  15 mL Mouth Rinse BID   . aspirin EC  325 mg Oral Daily  . citalopram  20 mg Oral Daily  . digoxin  0.125 mg Oral Daily  . feeding supplement  1 Container Oral TID WC  . feeding supplement  237 mL Oral Q24H  . ferrous fumarate-b12-vitamic C-folic acid  1 capsule Oral TID PC  . [START ON 01/12/2013] influenza vac split quadrivalent PF  0.5 mL Intramuscular Tomorrow-1000  . insulin aspart  0-24 Units Subcutaneous TID AC & HS  . insulin glargine  12 Units Subcutaneous QHS  . pantoprazole  40 mg Oral BID  . potassium chloride  40 mEq Oral Daily  . sildenafil  40 mg Per Tube TID  . sodium chloride  10 mL Intravenous Q12H  . spironolactone  25 mg Oral Daily  . torsemide  20 mg Oral BID  . Warfarin - Pharmacist Dosing Inpatient   Does not apply q1800    Infusions: . sodium chloride    . sodium chloride 250 mL (01/08/13 2157)  . DOBUTamine 3 mcg/kg/min (01/10/13 1500)    PRN Medications: acetaminophen (TYLENOL) oral liquid 160 mg/5 mL, ALPRAZolam, ondansetron (ZOFRAN) IV, oxyCODONE-acetaminophen, potassium chloride, sodium chloride, sodium chloride, traMADol   Assessment:   1. Acute on chronic systolic HF with biventricular HF EF 10%  --S/p HM II VAD with TV repair 12/18/12  --s/p RVAD. - explanted 8/25 2. 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 Cardiogenic shock  4. VT s/p Biotronik ICD on amio 5. Chronic renal failure, stage III 6. 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 ABL anemia  8. PNA 9. Severe protein calorie malnutrition - on TPN 10. Hyponatremia/Hypokalemia 11. 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 Acute respiratory failure  Plan/Discussion:     Wean dobutamine to 2 mcg in am. Supplement K.   Volume status improving. Continue  torsemide and spiro.   OT/PT following . Recommending CIR.    PNA; Afebrile, WBCs falling. Off Vanc and fluconazole. Will follow.    INR 2.4. Pharmacy to dose coumadin.  On ASA.  LDH stable.  May need a unit of RBCs soon.  Limit blood draws.    [name],[name], NP-C 2:42 PM Length of Stay: 34 VAD Team Pager [phone] (7am - 7am) VAD Issue Non-VAD issues HF Pager [phone]  Patient seen and examined with Tonye Becket, NP. We discussed all aspects of the encounter. I agree with the assessment and plan as stated above.   Doing well. Agree with continuing to wean dobutamine. Will turn down to 2 now. Supp K+.  VAD interrogation looks good.  Continue aggressive PT.   Lalia Loudon,MD 4:01 PM

## 2013-01-11 NOTE — Progress Notes (Signed)
Daughter completed LVAD dressing change with supervision; getting better with time. LVAD site unremarkable with no s/s of infection. Patient tolerated well. Assisted patient to bedside commode; + soft stool noted. Returned to bed. Call bell near. Will continue to monitor.Kaitlin [name]

## 2013-01-11 NOTE — Progress Notes (Signed)
Occupational Therapy Treatment Patient Details Name: BRIGITTE SODERBERG MRN: [id number] DOB: [birth year] Today's Date: 01/11/2013 Time: 5621-3086 OT Time Calculation (min): 54 min  OT Assessment / Plan / Recommendation  History of present illness Ms. 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    OT comments  Pt continues to make progress each session  Follow Up Recommendations  CIR       Equipment Recommendations  3 in 1 bedside comode;Tub/shower bench    Recommendations for Other Services Rehab consult  Frequency Min 3X/week   Progress towards OT Goals Progress towards OT goals: Progressing toward goals  Plan Discharge plan remains appropriate    Precautions / Restrictions Precautions Precautions: Sternal;Fall Precaution Comments: LVAD Required Braces or Orthoses:  (LVAD equipment and bag)       ADL  Upper Body Bathing: Set up;Supervision/safety Where Assessed - Upper Body Bathing: Supported sitting;Unsupported sitting Lower Body Bathing: Minimal assistance Where Assessed - Lower Body Bathing: Supported sit to stand Lower Body Dressing: Set up (doff and don socks) Where Assessed - Lower Body Dressing: Unsupported sitting Transfers/Ambulation Related to ADLs: min A sit <>stand ADL Comments: Pt instructed in bathing session--working on managing wires on main power source sit to stand from recliner. Pt min A with donning on vest, S attaching batteries to clips, min A switching to battery from main power  source (putting connecting to batter), min A putting batteries in holsters. Pt was able to tell me that what she needed first was her batteries and connectors. I handed them to her and she tested and connected them to holders independently matching up the arrows. Pt was able to tell me which part of the the wires were connected to her and then tried connecting the correct end to the battery, her only trouble is not getting the two ends together enough to stop the beeping. She was successful unscrewing the both cords today.Rochele Pages pt 44 minutes for bathing ADL and getting LVAD switched from main power source to batteries     OT Goals(current goals can now be found in the care plan section)    Visit Information  Last OT Received On: 01/11/13 Assistance Needed: +1 History of Present Illness: Ms. 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           Cognition  Cognition Arousal/Alertness: Awake/alert Behavior During Therapy: [facility] for tasks assessed/performed Overall Cognitive Status: Impaired/Different from baseline Area of Impairment: Memory Memory: Decreased short-term memory General Comments: Continues to improve in recall of steps to manage LVAD device    Mobility  Bed Mobility Bed Mobility: Not assessed Details for Bed Mobility Assistance:  Pt up in recliner upon arrival Transfers Transfers: Sit to Stand;Stand to Sit Sit to Stand: 4: Min assist;Without upper extremity assist;From chair/3-in-1 Stand to Sit: 4: Min  assist;Without upper extremity assist;To chair/3-in-1 Details for Transfer Assistance: VCs for technique, positioning and effort.     Exercises  Other Exercises Other Exercises: Part task transfer exercises, 10 transfers from varying heights sit to stand from bed. Initially bed elevated to higher height and progressively lowered with each transfer. Patient demonstrates some difficulty with transfer from lowest position.  Other Exercises:  (pt reports she has been doing hand exercises)   Balance Static Standing Balance Static Standing - Balance Support: Bilateral upper extremity supported;During functional activity Static Standing - Level of Assistance: 5: Stand by assistance Static Standing - Comment/# of Minutes: 6 minutes   End of Session OT - End of Session Equipment Utilized During Treatment: Oxygen Activity Tolerance:  (4 rest breaks in during session) Patient left: in chair;with call bell/phone within reach (on LVAD batteries)       Evette Georges [phone] 01/11/2013, 1:04 PM

## 2013-01-12 ENCOUNTER — Inpatient Hospital Stay (HOSPITAL_COMMUNITY): Payer: Commercial Managed Care - PPO

## 2013-01-12 DIAGNOSIS — Z95818 Presence of other cardiac implants and grafts: Secondary | ICD-10-CM

## 2013-01-12 DIAGNOSIS — I428 Other cardiomyopathies: Secondary | ICD-10-CM

## 2013-01-12 DIAGNOSIS — I5022 Chronic systolic (congestive) heart failure: Secondary | ICD-10-CM

## 2013-01-12 DIAGNOSIS — J90 Pleural effusion, not elsewhere classified: Secondary | ICD-10-CM

## 2013-01-12 LAB — BASIC METABOLIC PANEL
BUN: 20 mg/dL (ref 6–23)
CO2: 30 mEq/L (ref 19–32)
Chloride: 98 mEq/L (ref 96–112)
GFR calc Af Amer: 64 mL/min — ABNORMAL LOW (ref >90)
Potassium: 3.7 mEq/L (ref 3.5–5.1)

## 2013-01-12 LAB — GLUCOSE, CAPILLARY
Glucose-Capillary: 103 mg/dL — ABNORMAL HIGH (ref 70–99)
Glucose-Capillary: 94 mg/dL (ref 70–99)
Glucose-Capillary: 101 mg/dL — ABNORMAL HIGH (ref 70–99)
Glucose-Capillary: 108 mg/dL — ABNORMAL HIGH (ref 70–99)

## 2013-01-12 LAB — CBC
HCT: 26.2 % — ABNORMAL LOW (ref 36.0–46.0)
Hemoglobin: 8.3 g/dL — ABNORMAL LOW (ref 12.0–15.0)
MCV: 91.6 fL (ref 78.0–100.0)
WBC: 14.2 10*3/uL — ABNORMAL HIGH (ref 4.0–10.5)

## 2013-01-12 LAB — PROTIME-INR
INR: 2.68 — ABNORMAL HIGH (ref 0.00–1.49)
Prothrombin Time: 27.6 seconds — ABNORMAL HIGH (ref 11.6–15.2)

## 2013-01-12 LAB — LACTATE DEHYDROGENASE: LDH: 543 U/L — ABNORMAL HIGH (ref 94–250)

## 2013-01-12 MED ORDER — WARFARIN SODIUM 1 MG PO TABS
1.0000 mg | ORAL_TABLET | Freq: Once | ORAL | Status: AC
  Administered 2013-01-12: 1 mg via ORAL
  Filled 2013-01-12: qty 1

## 2013-01-12 MED ORDER — POTASSIUM CHLORIDE CRYS ER 20 MEQ PO TBCR
20.0000 meq | EXTENDED_RELEASE_TABLET | Freq: Once | ORAL | Status: AC
  Administered 2013-01-12: 20 meq via ORAL
  Filled 2013-01-12: qty 1

## 2013-01-12 NOTE — Progress Notes (Signed)
Patient ID: Kaitlin [name], female   DOB: 19-Jul-1945, 67 y.o.   MRN: [id number] HeartMate 2 Rounding Note  Subjective:    Ms. 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 EF 15-25% with mild to moderate RV dysfunction  8/18 Underwent HM II LVAD implant (under destination criteria) along with TV repair.  Taken back to the OR for RVAD support.  8/25 was taken back to OR for RVAD explant. 8/26 EGD which showed MW tear in esophagus. Injected and clipped. No further bleeding.  8/27  Back to OR for chest closure. 8/31 extubated  Overall feeling much better.  Working with PT. Weight down 3. Hgb unchanged from yesterday.  Dobutamine weaned to 1 mcg. LVAD INTERROGATION:   HeartMate II LVAD:  Flow 4.7  Liters/min  speed 8600  power 4.7 PI 5.6  No PI events  Objective:    Vital Signs:   Temp:  [98.3 F (36.8 C)-98.4 F (36.9 C)] 98.4 F (36.9 C) (09/11 2059) Pulse Rate:  [83-94] 83 (09/12 0530) Resp:  [16] 16 (09/11 2030) BP: (80)/(1) 80/1 mmHg (09/11 1234) SpO2:  [98 %] 98 % (09/11 2030) Weight:  [169 lb 8 oz (76.885 kg)] 169 lb 8 oz (76.885 kg) (09/12 0515) Last BM Date: 01/11/13 Mean arterial Pressure 80s  Intake/Output:   Intake/Output Summary (Last 24 hours) at 01/12/13 0855 Last data filed at 01/12/13 0602  Gross per 24 hour  Intake 532.31 ml  Output      0 ml  Net 532.31 ml     Physical Exam: MAP 80 General: lying in bed daughter at bedside HEENT: normal Neck: supple. JVP 7-8 Carotids 2+ bilat; no bruits. No lymphadenopathy or thryomegaly appreciated.  Cor: LVAD hum present. Lungs: CTA Abdomen: soft, nontender, normal BS Driveline: C/D/I; securement device intact  Extremities: no cyanosis, clubbing, rash, tr edema.  Neuro: awake follows commands moves all 4 extremities w/o difficulty. Affect pleasant  Telemetry: NSR 90s  Labs: Basic Metabolic Panel:  Recent Labs Lab 01/07/13 0416 01/07/13 2210  01/08/13 0430 01/10/13 0500 01/11/13 0525 01/12/13 0545  NA 141  --  135 138 137 137  K 2.9* 3.1* 3.7 3.1* 3.3* 3.7  CL 100  --  93* 95* 97 98  CO2 32  --  33* 33* 34* 30  GLUCOSE 86  --  97 92 103* 107*  BUN 38*  --  35* 28* 22 20  CREATININE 1.23*  --  1.20* 1.08 1.03 1.04  CALCIUM 9.1  --  9.3 9.3 9.3 9.4    Liver Function Tests:  Recent Labs Lab 01/06/13 0440 01/07/13 0416 01/08/13 0430  AST 40* 36 42*  ALT 27 25 27   ALKPHOS 137* 125* 131*  BILITOT 0.9 0.9 1.0  PROT 5.7* 5.5* 5.9*  ALBUMIN 2.7* 2.6* 2.7*   No results found for this basename: LIPASE, AMYLASE,  in the last 168 hours No results found for this basename: AMMONIA,  in the last 168 hours  CBC:  Recent Labs Lab 01/07/13 0416 01/08/13 0430 01/10/13 0500 01/11/13 0525 01/12/13 0545  WBC 16.8* 16.4* 14.4* 14.5* 14.2*  HGB 8.1* 8.5* 8.2* 8.2* 8.3*  HCT 25.5* 27.5* 26.8* 26.0* 26.2*  MCV 92.4 92.3 92.1 91.5 91.6  PLT 493* 487* 430* 410* 420*    INR:  Recent Labs Lab 01/08/13 0430 01/09/13 0500 01/10/13 0500 01/11/13 0525 01/12/13 0545  INR 3.50* 3.03* 2.17* 2.40* 2.68*  Imaging: No results found.   Medications:     Scheduled Medications: . amiodarone  200 mg Per NG tube BID  . antiseptic oral rinse  15 mL Mouth Rinse BID  . aspirin EC  325 mg Oral Daily  . citalopram  20 mg Oral Daily  . digoxin  0.125 mg Oral Daily  . feeding supplement  1 Container Oral TID WC  . feeding supplement  237 mL Oral Q24H  . ferrous fumarate-b12-vitamic C-folic acid  1 capsule Oral TID PC  . influenza vac split quadrivalent PF  0.5 mL Intramuscular Tomorrow-1000  . insulin aspart  0-24 Units Subcutaneous TID AC & HS  . insulin glargine  12 Units Subcutaneous QHS  . pantoprazole  40 mg Oral BID  . potassium chloride  40 mEq Oral Daily  . sildenafil  40 mg Per Tube TID  . sodium chloride  10 mL Intravenous Q12H  . spironolactone  25 mg Oral Daily  . torsemide  20 mg Oral BID  . Warfarin -  Pharmacist Dosing Inpatient   Does not apply q1800    Infusions: . sodium chloride    . sodium chloride 250 mL (01/08/13 2157)  . DOBUTamine 2 mcg/kg/min (01/11/13 1707)    PRN Medications: acetaminophen (TYLENOL) oral liquid 160 mg/5 mL, ALPRAZolam, ondansetron (ZOFRAN) IV, oxyCODONE-acetaminophen, sodium chloride, sodium chloride, traMADol   Assessment:   1. Acute on chronic systolic HF with biventricular HF EF 10%  --S/p HM II VAD with TV repair 12/18/12  --s/p RVAD. - explanted 8/25 2. 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 Cardiogenic shock  4. VT s/p Biotronik ICD on amio 5. Chronic renal failure, stage III 6. 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 ABL anemia  8. PNA 9. Severe protein calorie malnutrition - on TPN 10. Hyponatremia/Hypokalemia 11. 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 Acute respiratory failure  Plan/Discussion:     Dobutamine weaned to 1 mcg in am. Volume status ok. Weight down 3 pounds. Continue  torsemide and spiro. Renal function ok.   OT/PT following . Recommending CIR.    PNA; Afebrile, WBCs falling. Off Vanc and fluconazole. Will follow.    INR 2.6. Pharmacy to dose coumadin.  On ASA.  LDH stable.  Hemoglobin unchanged.    [name],[name], NP-C 8:55 AM Length of Stay: 35 VAD Team Pager [phone] (7am - 7am) VAD Issue Non-VAD issues HF Pager [phone]   Patient seen and examined with Tonye Becket, NP. We discussed all aspects of the encounter. I agree with the assessment and plan as stated above.   She continues to improve. Tolerating dobutamine wean well. Hopefully we can turn off dobutamine tomorrow. Continue with rehab. May need to cut demadex back to once a day soon.  Daniel Bensimhon,MD 5:00 PM

## 2013-01-12 NOTE — Progress Notes (Signed)
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    PT Comments   Pt continues to progress well with mobility and with transition between power sources. Pt able to test and connect all devices today but required cueing for vest wear and battery placement as well as backup bag contents. Pt unable to recall all sternal precautions and educated for these as well. Dexterity much  Improved.   Follow Up Recommendations  Home health PT;Supervision/Assistance - 24 hour     Does the patient have the potential to tolerate intense rehabilitation     Barriers to Discharge        Equipment Recommendations  Other (comment) (rollator)    Recommendations for Other Services    Frequency     Progress towards PT Goals Progress towards PT goals: Progressing toward goals  Plan Discharge plan needs to be updated    Precautions / Restrictions Precautions Precautions: Sternal;Fall Precaution Comments: LVAD   Pertinent Vitals/Pain HR 105 with gait 92% on RA with power source transitions 97% on 1L with gait No pain   Mobility  Bed Mobility Bed Mobility: Rolling Right;Right Sidelying to Sit Rolling Right: 5: Supervision Right Sidelying to Sit: 4: Min guard;HOB flat Details for Bed Mobility  Assistance: cueing for sequence to maintain precautions Transfers Sit to Stand: 4: Min guard;From chair/3-in-1;From bed Stand to Sit: 4: Min guard;To chair/3-in-1 Details for Transfer Assistance: VCs for technique, hand position Ambulation/Gait Ambulation/Gait Assistance: 4: Min guard Ambulation Distance (Feet): 320 Feet Assistive device: Rollator Ambulation/Gait Assistance Details: pt with one standing rest on first 160' and seated rest after completion of first 160' but completed second loop without resting. 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     Subjective Data      Cognition  Cognition Arousal/Alertness: Awake/alert Behavior During Therapy: [facility] for tasks assessed/performed Overall Cognitive Status: Within Functional Limits for tasks assessed Memory: Decreased short-term memory    Balance     End  of Session PT - End of Session Equipment Utilized During Treatment: Oxygen Activity Tolerance: Patient tolerated treatment well Patient left: in chair;with call bell/phone within  reach Nurse Communication: Mobility status   GP     [name] 01/12/2013, 1:48 PM [name], PT [phone]

## 2013-01-12 NOTE — Progress Notes (Signed)
ANTICOAGULATION CONSULT NOTE - Follow Up Consult  Pharmacy Consult for Coumadin Indication: LVAD  No Known Allergies  Patient Measurements: Height: 5\' 9"  (175.3 cm) Weight: 169 lb 8 oz (76.885 kg) IBW/kg (Calculated) : 66.2  Vital Signs: Pulse Rate: 83 (09/12 0530)  Labs:  Recent Labs  01/10/13 0500 01/11/13 0525 01/12/13 0545  HGB 8.2* 8.2* 8.3*  HCT 26.8* 26.0* 26.2*  PLT 430* 410* 420*  LABPROT 23.5* 25.4* 27.6*  INR 2.17* 2.40* 2.68*  CREATININE 1.08 1.03 1.04    Estimated Creatinine Clearance: 56.4 ml/min (by C-G formula based on Cr of 1.04).  Assessment: 67yof s/p LVAD and tricuspid valve repair on 8/18. Coumadin dosing changed to per pharmacy on 9/5.  Anticoagulation for clot prevention. LDH 543 stable.   No bleeding issues noted and cbc has remained low but stable. Regular diet resumed with supplements.  INR 2.6 today.  She is very sensitive to small dose changes.  Vancomycin / primaxin / fluconazole have been stopped. No fevers noted and wbc has trended down to 14.  Goal of Therapy:  INR 2-2.5 Monitor platelets by anticoagulation protocol: Yes   Plan:  1) warfarin 1mg  tonight 2) INR daily  Leota Sauers Pharm.D. CPP, BCPS Clinical Pharmacist [phone] 01/12/2013 10:39 AM

## 2013-01-12 NOTE — Progress Notes (Signed)
WET/DRY DRSG CHANGE DONE. NO S/S OF INFECTION. PT TOLERATED WELL.

## 2013-01-12 NOTE — Progress Notes (Signed)
. HeartMate 2 Rounding Note  Subjective:   Postop day #25  HeartMate 2 implantation with subsequent emergency centri- mag RVAD because of  RV dysfunction. Patient  had  stable hemodynamics on BiVAD support.Returned to OR 8-25 for successful removal of RVAD   Responsive but comfortable with IV sedation.  Had successful delayed sternal closure  8-28-014  Patient continues to have stable hemodynamics after chest closure Dobutamine has been weaned to 1 mcg per kilogram per minute and will be stopped today  Patient now in step down and had uneventful night. No PI events. Mean arterial blood pressure and heart rhythm stable. No fever.  INR istherapeutic at 2.7  Patient appears stronger and more responsive today and eager start education regarding her LVAD   A postop, postextubation 2-D echocardiogram  on 9-2 shows significant improvement in RV function and decrease in tricuspid regurgitation. Repeat 2-D echo  at higher pump speed 9600 RPM indicates good midline position of the interventricular septum, minimal opening of the aortic valve, persistent improvement of the RV    Patient being followed by nutritionist for low albumin, on supplemental protein feedings. Tolerating a dysphagia 3 diet.  UGI bleed from M-W tear- now inactive, on protonix  On coumadin , ASA with goal INR 2.0-2.5 LDH levels are being followed serially and are satisfactory- 523 today   diuresis for fluid overload- on oral Demadex  WBC >30k> 25>20>18 with procalcitonin 1.3>1.0 on Vanc, Primaxin and short course of IV Diflucan. Cultures neg , vancomycin Primaxin and Diflucan have been stopped . Old chest tube sites (3) being packed with 2 x 2 gauze wet-to-dry. RVAD cannulation sites are healing. Sternal incision is healing.  Drive line exit site healing.  LVAD INTERROGATION:  HeartMate II LVAD:  Flow 4.4 liters/min, speed 8600 power 3.5, PI 5.1.   Objective:    Vital Signs:   Temp:  [98.1 F (36.7 C)-98.4 F (36.9  C)] 98.1 F (36.7 C) (09/12 1500) Pulse Rate:  [83-86] 83 (09/12 0530) Resp:  [16] 16 (09/11 2030) SpO2:  [98 %] 98 % (09/11 2030) Weight:  [169 lb 8 oz (76.885 kg)] 169 lb 8 oz (76.885 kg) (09/12 0515) Last BM Date: 01/11/13 Mean arterial Pressure 84  Intake/Output:   Intake/Output Summary (Last 24 hours) at 01/12/13 1750 Last data filed at 01/12/13 1500  Gross per 24 hour  Intake 365.71 ml  Output    250 ml  Net 115.71 ml     Physical Exam: General: extubated, responsive, comfortable HEENT: normal Neck: supple. JVP normal  No lymphadenopathy or thryomegaly appreciated. Cor: Mechanical heart sounds with LVAD hum present. Lungs: clear Abdomen: soft, nontender, nondistended. No hepatosplenomegaly. No bruits or masses. Good bowel sounds. Extremities: no cyanosis, clubbing, rash,  Sig edema remains  extremities warm Neuro: moves all 4 extremities  Telemetry: sinus 94/min  Labs: Basic Metabolic Panel:  Recent Labs Lab 01/07/13 0416 01/07/13 2210 01/08/13 0430 01/10/13 0500 01/11/13 0525 01/12/13 0545  NA 141  --  135 138 137 137  K 2.9* 3.1* 3.7 3.1* 3.3* 3.7  CL 100  --  93* 95* 97 98  CO2 32  --  33* 33* 34* 30  GLUCOSE 86  --  97 92 103* 107*  BUN 38*  --  35* 28* 22 20  CREATININE 1.23*  --  1.20* 1.08 1.03 1.04  CALCIUM 9.1  --  9.3 9.3 9.3 9.4    Liver Function Tests:  Recent Labs Lab 01/06/13 0440 01/07/13 0416 01/08/13 0430  AST 40* 36 42*  ALT 27 25 27   ALKPHOS 137* 125* 131*  BILITOT 0.9 0.9 1.0  PROT 5.7* 5.5* 5.9*  ALBUMIN 2.7* 2.6* 2.7*   No results found for this basename: LIPASE, AMYLASE,  in the last 168 hours No results found for this basename: AMMONIA,  in the last 168 hours  CBC:  Recent Labs Lab 01/07/13 0416 01/08/13 0430 01/10/13 0500 01/11/13 0525 01/12/13 0545  WBC 16.8* 16.4* 14.4* 14.5* 14.2*  HGB 8.1* 8.5* 8.2* 8.2* 8.3*  HCT 25.5* 27.5* 26.8* 26.0* 26.2*  MCV 92.4 92.3 92.1 91.5 91.6  PLT 493* 487* 430* 410*  420*    INR:  Recent Labs Lab 01/08/13 0430 01/09/13 0500 01/10/13 0500 01/11/13 0525 01/12/13 0545  INR 3.50* 3.03* 2.17* 2.40* 2.68*    Other results:  EKG:   Imaging: Dg Chest 2 View  01/12/2013   CLINICAL DATA:  Left ventricular assist device surgery on 12/18/2012. Short of breath.  EXAM: CHEST  2 VIEW  COMPARISON:  01/09/2013  FINDINGS: Postsurgical changes from cardiac surgery and placement of the left ventricular assist device are stable. The cardiac silhouette remains mildly enlarged.  Coarse reticular perihilar and lung base opacities are stable with some patchy airspace opacity noted in the left mid and lower lung zone and along the right minor fissure, also stable when allowing for differences in patient positioning. These lung opacities are likely atelectasis. Minimal pleural effusions are noted.  No pneumothorax.  Right subclavian central venous line is stable with its tip in the lower right atrium.  IMPRESSION: No change from the prior study.  Areas of lung opacity are most likely atelectasis. There are minimal pleural effusions, but no convincing pulmonary edema. No pneumothorax.   Electronically Signed   By: Amie Portland   On: 01/12/2013 10:02     Medications:     Scheduled Medications: . amiodarone  200 mg Per NG tube BID  . antiseptic oral rinse  15 mL Mouth Rinse BID  . aspirin EC  325 mg Oral Daily  . citalopram  20 mg Oral Daily  . digoxin  0.125 mg Oral Daily  . feeding supplement  1 Container Oral TID WC  . feeding supplement  237 mL Oral Q24H  . ferrous fumarate-b12-vitamic C-folic acid  1 capsule Oral TID PC  . influenza vac split quadrivalent PF  0.5 mL Intramuscular Tomorrow-1000  . insulin aspart  0-24 Units Subcutaneous TID AC & HS  . insulin glargine  12 Units Subcutaneous QHS  . pantoprazole  40 mg Oral BID  . potassium chloride  40 mEq Oral Daily  . sildenafil  40 mg Per Tube TID  . sodium chloride  10 mL Intravenous Q12H  . spironolactone   25 mg Oral Daily  . torsemide  20 mg Oral BID  . warfarin  1 mg Oral ONCE-1800  . Warfarin - Pharmacist Dosing Inpatient   Does not apply q1800    Infusions: . sodium chloride    . sodium chloride 250 mL (01/08/13 2157)  . 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 Completed today  LVAD education for the patient and family Discharge planning to home with home nurse, home PT   I reviewed the LVAD parameters from today, and compared the results to the patient's prior recorded data.  No programming changes were made.  The LVAD is functioning within specified parameters.  The patient performs LVAD self-test daily.  LVAD interrogation was negative for any significant power changes, alarms or PI events/speed drops.  LVAD equipment check completed and is in good working order.  Back-up equipment present.   LVAD education done on emergency procedures and precautions and reviewed exit site care.  Length of Stay: 35  [name] III,PETER 01/12/2013, 5:50 PM

## 2013-01-12 NOTE — Progress Notes (Signed)
Chaplain provided ministry of presence and emotional support to the patient. Chaplain helped patient to express her feelings surrounding her current health status. Patient verbalized her hopes of being released soon. Chaplain will follow up as needed or requested.  01/12/13 1348  Clinical Encounter Type  Visited With Patient  Visit Type Follow-up;Social support  Spiritual Encounters  Spiritual Needs Emotional  Stress Factors  Patient Stress Factors Health changes

## 2013-01-12 NOTE — Progress Notes (Signed)
CARDIAC REHAB PHASE I   PRE:  Rate/Rhythm: 91 1st deg HB    BP: sitting 90    SaO2: 93 1L  MODE:  Ambulation: 300 ft   POST:  Rate/Rhythm: 109     BP: sitting 108 then 100     SaO2: 96 2L   Pt tired but willing to walk. Sat x1 at 150 ft, asked for water. Very motivated. Legs stronger and dexterity improving. Still fumbling inserting cables while going to batteries. Return to bed. BP elevated. Will notify RN. [phone]  Elissa Lovett [place] CES, ACSM 01/12/2013 2:58 PM

## 2013-01-12 NOTE — Progress Notes (Signed)
RN MADE AWARE PER CARDIAC REHAB PT MAP AFTER AMBULATING WAS 102. RN MADE MOLLY, LVAD RN COORDNATOR AWARE. NO NEW ORDERS GIVEN.

## 2013-01-13 LAB — CBC
HCT: 27.5 % — ABNORMAL LOW (ref 36.0–46.0)
Hemoglobin: 8.4 g/dL — ABNORMAL LOW (ref 12.0–15.0)
MCH: 28.1 pg (ref 26.0–34.0)
MCHC: 30.5 g/dL (ref 30.0–36.0)
MCV: 92 fL (ref 78.0–100.0)

## 2013-01-13 LAB — BASIC METABOLIC PANEL
BUN: 21 mg/dL (ref 6–23)
CO2: 31 mEq/L (ref 19–32)
Chloride: 98 mEq/L (ref 96–112)
GFR calc Af Amer: 62 mL/min — ABNORMAL LOW (ref >90)
Glucose, Bld: 103 mg/dL — ABNORMAL HIGH (ref 70–99)
Potassium: 3.9 mEq/L (ref 3.5–5.1)

## 2013-01-13 LAB — GLUCOSE, CAPILLARY
Glucose-Capillary: 102 mg/dL — ABNORMAL HIGH (ref 70–99)
Glucose-Capillary: 131 mg/dL — ABNORMAL HIGH (ref 70–99)
Glucose-Capillary: 112 mg/dL — ABNORMAL HIGH (ref 70–99)

## 2013-01-13 LAB — PROTIME-INR
INR: 2.67 — ABNORMAL HIGH (ref 0.00–1.49)
Prothrombin Time: 27.5 seconds — ABNORMAL HIGH (ref 11.6–15.2)

## 2013-01-13 LAB — DIGOXIN LEVEL: Digoxin Level: 1.8 ng/mL (ref 0.8–2.0)

## 2013-01-13 MED ORDER — AMLODIPINE BESYLATE 2.5 MG PO TABS
2.5000 mg | ORAL_TABLET | Freq: Every day | ORAL | Status: DC
  Administered 2013-01-13 – 2013-01-15 (×3): 2.5 mg via ORAL
  Filled 2013-01-13 (×3): qty 1

## 2013-01-13 MED ORDER — WARFARIN SODIUM 1 MG PO TABS
1.0000 mg | ORAL_TABLET | Freq: Once | ORAL | Status: AC
  Administered 2013-01-13: 1 mg via ORAL
  Filled 2013-01-13: qty 1

## 2013-01-13 NOTE — Progress Notes (Signed)
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    PT Comments   Patient able to list out all equipment needed to convert to battery source, minimal cues for power conversion, patient able to self correct at times.  Patient increased ambulation distance on 2 liters SpO2 >96%, DOE increased to 3/4, rest breaks taken. Patient reports improved DOE with steady continuous breathing pattern, instructed for breaths every other step. Will continuous this technique moving forward. Overall Kaitlin [name] in progressing well, will continue to work on activity tolerance and education as able.  Follow Up Recommendations  Home health PT;Supervision/Assistance - 24 hour     Does the patient have the potential to tolerate intense rehabilitation     Barriers to Discharge        Equipment Recommendations  Other (comment) (rollator)    Recommendations for Other Services    Frequency     Progress towards PT Goals Progress towards PT goals: Progressing toward goals  Plan Discharge plan needs to be updated    Precautions / Restrictions Precautions Precautions: Sternal;Fall Precaution Comments: LVAD Restrictions Weight Bearing Restrictions:  (sternal precautions)   Pertinent Vitals/Pain SpO2 on 2 L  >95%, DOE 3/4 at times, no pain    Mobility  Bed Mobility Bed Mobility: Not assessed Transfers Transfers: Sit to Stand;Stand to Sit Sit to Stand: 4: Min assist Stand to Sit: 4: Min guard;To chair/3-in-1 Details for Transfer Assistance: VCs for technique, positioning and effort.  Ambulation/Gait Ambulation/Gait Assistance: 4: Min guard Ambulation Distance (Feet): 450 Feet Assistive device: Rollator Ambulation/Gait Assistance Details: One seated rest break; 4 standing rest breaks, VCs for continuous steady breathing pattern during ambulation Gait Pattern: Step-through pattern;Decreased stride length;Narrow base of support Gait velocity: decreased General Gait Details: patient required one VC to lock rollator when sitting, patient able to go further without rest when she used a constant steady breathing pattern taking a breath every other step Stairs: No     PT Goals (current goals can now be found in the care plan section) Acute Rehab PT Goals Patient Stated Goal: get back to work at the [employer] and do the [employer]  Last PT Received On: 01/13/13 Assistance Needed: +1 History of Present Illness: Kaitlin [name] is a 67 y/o woman with h/o severe HTN, [name], 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     Subjective Data  Subjective: I don't feel tired, I just get  Patient Stated Goal: get back to work at  the [employer] and do the [employer]  Cognition Arousal/Alertness: Awake/alert Behavior During Therapy: WFL for tasks  assessed/performed Overall Cognitive Status: Within Functional Limits for tasks assessed Memory: Decreased short-term memory    Balance  Static Standing Balance Static Standing - Balance Support: Bilateral upper extremity supported;During functional activity Static Standing - Level of Assistance: 5: Stand by assistance Static Standing - Comment/# of Minutes: 6 minutes  End of Session PT - End of Session Equipment Utilized During Treatment: Oxygen Activity Tolerance: Patient tolerated treatment well Patient left: in chair;with call bell/phone within reach Nurse Communication: Mobility status   GP     Fabio Asa 01/13/2013, 11:00 AM Charlotte Crumb, PT DPT  [phone]

## 2013-01-13 NOTE — Progress Notes (Signed)
CARDIAC REHAB PHASE I   PRE:  Rate/Rhythm: 92  BP:  Supine:   Sitting: 98 doppler  Standing:    SaO2: 96 2lncc  MODE:  Ambulation: 150 ft   POST:  Rate/Rhythm: 103  BP:  Supine:  Sitting: 98 doppler  Standing:   SaO2: 98  Ambulated x 1 assist with Rolater with four standing rest breaks. Pt encouraged to ambulate further but remarked that she was tired from the previous walk this morning.  Pt able to demonstrate appropriately how to disconnect and place lvad cord  to battery pack.  Pt able to problem solve appropriately when she was unable to put the battery pack in the pocket of the vest. Pt back to chair, call bell in reach denies any needs at this time. [id number] Karlene Lineman RN, BSN

## 2013-01-13 NOTE — Progress Notes (Signed)
ANTICOAGULATION CONSULT NOTE - Follow Up Consult  Pharmacy Consult for Coumadin Indication: LVAD  No Known Allergies  Patient Measurements: Height: 5\' 9"  (175.3 cm) Weight: 168 lb 6.9 oz (76.4 kg) IBW/kg (Calculated) : 66.2  Vital Signs: Temp: 98.4 F (36.9 C) (09/13 0400)  Labs:  Recent Labs  01/11/13 0525 01/12/13 0545 01/13/13 0555  HGB 8.2* 8.3* 8.4*  HCT 26.0* 26.2* 27.5*  PLT 410* 420* 403*  LABPROT 25.4* 27.6* 27.5*  INR 2.40* 2.68* 2.67*  CREATININE 1.03 1.04 1.07    Estimated Creatinine Clearance: 54.8 ml/min (by C-G formula based on Cr of 1.07).  Assessment: 67yof s/p LVAD and tricuspid valve repair on 8/18. Coumadin dosing changed to per pharmacy on 9/5.  Anticoagulation for clot prevention. LDH 543- stable. No bleeding issues noted and cbc has remained low but stable.  INR 2.67 today which is stable from yesterday's reading of 2.68.  She is very sensitive to small dose changes and concerned holding a dose will drop her below goal.   Goal of Therapy:  INR 2-2.5 Monitor platelets by anticoagulation protocol: Yes   Plan:  1) warfarin 1mg  tonight 2) INR daily  Mykhia Danish D. Lankford Gutzmer, PharmD Clinical Pharmacist Pager: [phone] 01/13/2013 12:05 PM

## 2013-01-13 NOTE — Progress Notes (Addendum)
Patient ID: Kaitlin [name], female   DOB: [birth year], 68 y.o.   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 EF 15-25% with mild to moderate RV dysfunction  8/18 Underwent HM II LVAD implant (under destination criteria) along with TV repair.  Taken back to the OR for RVAD support.  8/25 was taken back to OR for RVAD explant. 8/26 EGD which showed MW tear in esophagus. Injected and clipped. No further bleeding.  8/27  Back to OR for chest closure. 8/31 extubated  Overall feeling much better.  Working with PT. Weight down 1 pound. Hgb stable.  Dobutamine weaned to 1 mcg. MAPS have been running 90-100.   LVAD INTERROGATION:   HeartMate II LVAD:  Flow 3.7  Liters/min  speed 8600  power 4.0 PI 6.2  No PI events  Objective:    Vital Signs:   Temp:  [98.1 F (36.7 C)-99 F (37.2 C)] 98.4 F (36.9 C) (09/13 0400) SpO2:  [97 %] 97 % (09/12 2056) Weight:  [76.4 kg (168 lb 6.9 oz)] 76.4 kg (168 lb 6.9 oz) (09/13 0438) Last BM Date: 01/12/13 Mean arterial Pressure 80s  Intake/Output:   Intake/Output Summary (Last 24 hours) at 01/13/13 1051 Last data filed at 01/13/13 0700  Gross per 24 hour  Intake    848 ml  Output   1450 ml  Net   -602 ml     Physical Exam: MAP 80 General: lying in bed daughter at bedside HEENT: normal Neck: supple. JVP 7-8 Carotids 2+ bilat; no bruits. No lymphadenopathy or thryomegaly appreciated.  Cor: LVAD hum present. Lungs: CTA Abdomen: soft, nontender, normal BS Driveline: C/D/I; securement device intact  Extremities: no cyanosis, clubbing, rash, tr edema.  Neuro: awake follows commands moves all 4 extremities w/o difficulty. Affect pleasant  Telemetry: NSR 90s  Labs: Basic Metabolic Panel:  Recent Labs Lab 01/08/13 0430 01/10/13 0500 01/11/13 0525 01/12/13 0545 01/13/13 0555  NA 135 138 137 137 139  K 3.7 3.1* 3.3*  3.7 3.9  CL 93* 95* 97 98 98  CO2 33* 33* 34* 30 31  GLUCOSE 97 92 103* 107* 103*  BUN 35* 28* 22 20 21   CREATININE 1.20* 1.08 1.03 1.04 1.07  CALCIUM 9.3 9.3 9.3 9.4 9.6    Liver Function Tests:  Recent Labs Lab 01/07/13 0416 01/08/13 0430  AST 36 42*  ALT 25 27  ALKPHOS 125* 131*  BILITOT 0.9 1.0  PROT 5.5* 5.9*  ALBUMIN 2.6* 2.7*   No results found for this basename: LIPASE, AMYLASE,  in the last 168 hours No results found for this basename: AMMONIA,  in the last 168 hours  CBC:  Recent Labs Lab 01/08/13 0430 01/10/13 0500 01/11/13 0525 01/12/13 0545 01/13/13 0555  WBC 16.4* 14.4* 14.5* 14.2* 13.5*  HGB 8.5* 8.2* 8.2* 8.3* 8.4*  HCT 27.5* 26.8* 26.0* 26.2* 27.5*  MCV 92.3 92.1 91.5 91.6 92.0  PLT 487* 430* 410* 420* 403*    INR:  Recent Labs Lab 01/09/13 0500 01/10/13 0500 01/11/13 0525 01/12/13 0545 01/13/13 0555  INR 3.03* 2.17* 2.40* 2.68* 2.67*    Imaging: Dg Chest 2 View  01/12/2013   CLINICAL DATA:  Left ventricular assist device surgery on 12/18/2012. Short of breath.  EXAM: CHEST  2 VIEW  COMPARISON:  01/09/2013  FINDINGS: Postsurgical changes from cardiac surgery and placement of  the left ventricular assist device are stable. The cardiac silhouette remains mildly enlarged.  Coarse reticular perihilar and lung base opacities are stable with some patchy airspace opacity noted in the left mid and lower lung zone and along the right minor fissure, also stable when allowing for differences in patient positioning. These lung opacities are likely atelectasis. Minimal pleural effusions are noted.  No pneumothorax.  Right subclavian central venous line is stable with its tip in the lower right atrium.  IMPRESSION: No change from the prior study.  Areas of lung opacity are most likely atelectasis. There are minimal pleural effusions, but no convincing pulmonary edema. No pneumothorax.   Electronically Signed   By: Amie Portland   On: 01/12/2013 10:02      Medications:     Scheduled Medications: . amiodarone  200 mg Per NG tube BID  . antiseptic oral rinse  15 mL Mouth Rinse BID  . aspirin EC  325 mg Oral Daily  . citalopram  20 mg Oral Daily  . digoxin  0.125 mg Oral Daily  . feeding supplement  1 Container Oral TID WC  . feeding supplement  237 mL Oral Q24H  . ferrous fumarate-b12-vitamic C-folic acid  1 capsule Oral TID PC  . insulin aspart  0-24 Units Subcutaneous TID AC & HS  . insulin glargine  12 Units Subcutaneous QHS  . pantoprazole  40 mg Oral BID  . potassium chloride  40 mEq Oral Daily  . sildenafil  40 mg Per Tube TID  . sodium chloride  10 mL Intravenous Q12H  . spironolactone  25 mg Oral Daily  . torsemide  20 mg Oral BID  . Warfarin - Pharmacist Dosing Inpatient   Does not apply q1800    Infusions: . sodium chloride    . sodium chloride 250 mL (01/08/13 2157)  . DOBUTamine 1 mcg/kg/min (01/12/13 0900)    PRN Medications: acetaminophen (TYLENOL) oral liquid 160 mg/5 mL, ALPRAZolam, ondansetron (ZOFRAN) IV, oxyCODONE-acetaminophen, sodium chloride, sodium chloride, traMADol   Assessment:   1. Acute on chronic systolic HF with biventricular HF EF 10%  --S/p HM II VAD with TV repair 12/18/12  --s/p RVAD. - explanted 8/25 2. 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 Cardiogenic shock  4. VT s/p Biotronik ICD on amio 5. Chronic renal failure, stage III 6. 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 ABL anemia  8. PNA 9. Severe protein calorie malnutrition - on TPN 10. Hyponatremia/Hypokalemia 11. 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 Acute respiratory failure  Plan/Discussion:    Continues to progress. Will stop dobutamine. Add low dose amlodipine for HTN.  Continue PT. Target discharge on Thursday.  Changes labs to every other day.   Kaitlin [name]  10:51 AM Length of Stay: 36 VAD Team Pager [phone] (7am - 7am) VAD Issue Non-VAD issues HF Pager [phone]

## 2013-01-14 LAB — GLUCOSE, CAPILLARY

## 2013-01-14 LAB — PROTIME-INR
INR: 2.15 — ABNORMAL HIGH (ref 0.00–1.49)
Prothrombin Time: 23.3 seconds — ABNORMAL HIGH (ref 11.6–15.2)

## 2013-01-14 MED ORDER — WARFARIN SODIUM 1 MG PO TABS
1.0000 mg | ORAL_TABLET | Freq: Once | ORAL | Status: DC
  Filled 2013-01-14: qty 1

## 2013-01-14 MED ORDER — AMIODARONE HCL 200 MG PO TABS
200.0000 mg | ORAL_TABLET | Freq: Every day | ORAL | Status: DC
  Administered 2013-01-15 – 2013-01-18 (×4): 200 mg via NASOGASTRIC
  Filled 2013-01-14 (×4): qty 1

## 2013-01-14 MED ORDER — WARFARIN SODIUM 2 MG PO TABS
2.0000 mg | ORAL_TABLET | Freq: Once | ORAL | Status: AC
  Administered 2013-01-14: 2 mg via ORAL
  Filled 2013-01-14 (×2): qty 1

## 2013-01-14 NOTE — Progress Notes (Signed)
Patient ID: ELEN ACERO, female   DOB: 02-14-1946, 67 y.o.   MRN: [id number] HeartMate 2 Rounding Note  Subjective:    Ms. 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 EF 15-25% with mild to moderate RV dysfunction  8/18 Underwent HM II LVAD implant (under destination criteria) along with TV repair.  Taken back to the OR for RVAD support.  8/25 was taken back to OR for RVAD explant. 8/26 EGD which showed MW tear in esophagus. Injected and clipped. No further bleeding.  8/27  Back to OR for chest closure. 8/31 extubated  Overall feeling much better.  Dobutamine off.  Started norvasc 2.5 yesterday for elevated MAPS. Walking halls down to 2300. Weight down 4 pounds. Hgb stable.[name] MAPS have been running 80-100. One reading down to 68. 2 PI events   LVAD INTERROGATION:   HeartMate II LVAD:  Flow --  Liters/min  speed 8600  power 4.0 PI 6.5  2 PI events  Objective:    Vital Signs:   Temp:  [98.1 F (36.7 C)-98.4 F (36.9 C)] 98.4 F (36.9 C) (09/14 0413) Weight:  [74.4 kg (164 lb 0.4 oz)] 74.4 kg (164 lb 0.4 oz) (09/14 0500) Last BM Date: 01/13/13 Mean arterial Pressure 80s  Intake/Output:   Intake/Output Summary (Last 24 hours) at 01/14/13 1609 Last data filed at 01/14/13 0900  Gross per 24 hour  Intake    240 ml  Output      0 ml  Net    240 ml     Physical Exam: MAP 80-100 General: lying in bed family at bedside HEENT: normal Neck: supple. JVP 7-8 Carotids 2+ bilat; no bruits. No lymphadenopathy or thryomegaly appreciated.  Cor: LVAD hum present. Lungs: CTA Abdomen: soft, nontender, normal BS Driveline: C/D/I; securement device intact  Extremities: no cyanosis, clubbing, rash, tr edema.  Neuro: awake follows commands moves all 4 extremities w/o difficulty. Affect pleasant  Telemetry: NSR 90s  Labs: Basic Metabolic Panel:  Recent Labs Lab 01/08/13 0430 01/10/13 0500 01/11/13 0525 01/12/13 0545  01/13/13 0555  NA 135 138 137 137 139  K 3.7 3.1* 3.3* 3.7 3.9  CL 93* 95* 97 98 98  CO2 33* 33* 34* 30 31  GLUCOSE 97 92 103* 107* 103*  BUN 35* 28* 22 20 21   CREATININE 1.20* 1.08 1.03 1.04 1.07  CALCIUM 9.3 9.3 9.3 9.4 9.6    Liver Function Tests:  Recent Labs Lab 01/08/13 0430  AST 42*  ALT 27  ALKPHOS 131*  BILITOT 1.0  PROT 5.9*  ALBUMIN 2.7*   No results found for this basename: LIPASE, AMYLASE,  in the last 168 hours No results found for this basename: AMMONIA,  in the last 168 hours  CBC:  Recent Labs Lab 01/08/13 0430 01/10/13 0500 01/11/13 0525 01/12/13 0545 01/13/13 0555  WBC 16.4* 14.4* 14.5* 14.2* 13.5*  HGB 8.5* 8.2* 8.2* 8.3* 8.4*  HCT 27.5* 26.8* 26.0* 26.2* 27.5*  MCV 92.3 92.1 91.5 91.6 92.0  PLT 487* 430* 410* 420* 403*    INR:  Recent Labs Lab 01/10/13 0500 01/11/13 0525 01/12/13 0545 01/13/13 0555 01/14/13 1000  INR 2.17* 2.40* 2.68* 2.67* 2.15*    Imaging: No results found.   Medications:     Scheduled Medications: . amiodarone  200 mg Per NG tube BID  . amLODipine  2.5 mg Oral Daily  . antiseptic oral rinse  15 mL Mouth Rinse  BID  . aspirin EC  325 mg Oral Daily  . citalopram  20 mg Oral Daily  . digoxin  0.125 mg Oral Daily  . feeding supplement  1 Container Oral TID WC  . feeding supplement  237 mL Oral Q24H  . ferrous fumarate-b12-vitamic C-folic acid  1 capsule Oral TID PC  . insulin aspart  0-24 Units Subcutaneous TID AC & HS  . insulin glargine  12 Units Subcutaneous QHS  . pantoprazole  40 mg Oral BID  . sildenafil  40 mg Per Tube TID  . sodium chloride  10 mL Intravenous Q12H  . spironolactone  25 mg Oral Daily  . warfarin  2 mg Oral ONCE-1800  . Warfarin - Pharmacist Dosing Inpatient   Does not apply q1800    Infusions: . sodium chloride    . sodium chloride 250 mL (01/08/13 2157)    PRN Medications: acetaminophen (TYLENOL) oral liquid 160 mg/5 mL, ALPRAZolam, ondansetron (ZOFRAN) IV,  oxyCODONE-acetaminophen, sodium chloride, sodium chloride, traMADol   Assessment:   1. Acute on chronic systolic HF with biventricular HF EF 10%  --S/p HM II VAD with TV repair 12/18/12  --s/p RVAD. - explanted 8/25 2. 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 Cardiogenic shock  4. VT s/p Biotronik ICD on amio 5. Chronic renal failure, stage III 6. 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 ABL anemia  8. PNA 9. Severe protein calorie malnutrition - on TPN 10. Hyponatremia/Hypokalemia 11. 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 Acute respiratory failure  Plan/Discussion:    Continues to progress. Weight now at baseline. Given low MAP and 2 PI events this am she is likely getting near euvolemia. Will stop demadex today (and potassium). Will likely need demadex 20 every other day or so - can follow by weight.   Continue PT. Target discharge on Thursday. Consider ramp study prior to d/c or at her first outpatient visit.   Changes labs to every other day.   Ravonda Brecheen,MD  4:09 PM Length of Stay: 37 VAD Team Pager [phone] (7am - 7am) VAD Issue Non-VAD issues HF Pager [phone]

## 2013-01-14 NOTE — Progress Notes (Signed)
Nursing note Patient ambulated x1 assist, with walker and back up equipment on 2l 02   340 feet. Patient with a few rest breaks and one sitting break on the way back. Gait steady. Will continue to monitor patient. Amberle Lyter, Randall An RN

## 2013-01-14 NOTE — Progress Notes (Signed)
Nursing note  01/14/13 Patient MAP 60, when elvated feet and resting MAP80, patient states she does not feel lightheaded or dizzy feels about the same as yesterday.  2 PI events noted on LVAD history.  Dr Gala Romney made aware and orders received. Will continue to monitor patient. Dashawn Golda, Randall An RN

## 2013-01-14 NOTE — Progress Notes (Signed)
Patient declined to ambulate at this time  Will continue to encourage. Estrella Alcaraz, Randall An RN

## 2013-01-14 NOTE — Progress Notes (Addendum)
ANTICOAGULATION CONSULT NOTE - Follow Up Consult  Pharmacy Consult for Coumadin Indication: LVAD  No Known Allergies  Patient Measurements: Height: 5\' 9"  (175.3 cm) Weight: 164 lb 0.4 oz (74.4 kg) IBW/kg (Calculated) : 66.2  Vital Signs: Temp: 98.4 F (36.9 C) (09/14 0413) Temp src: Oral (09/14 0413)  Labs:  Recent Labs  01/12/13 0545 01/13/13 0555 01/14/13 1000  HGB 8.3* 8.4*  --   HCT 26.2* 27.5*  --   PLT 420* 403*  --   LABPROT 27.6* 27.5* 23.3*  INR 2.68* 2.67* 2.15*  CREATININE 1.04 1.07  --     Estimated Creatinine Clearance: 54.8 ml/min (by C-G formula based on Cr of 1.07).  Assessment: 67yof s/p LVAD and tricuspid valve repair on 8/18. Coumadin dosing changed to per pharmacy on 9/5.  Anticoagulation for clot prevention. No bleeding issues noted and cbc has remained low but stable.  INR 2.15 today which is down from yesterday, but is now within tighter goal that is desired.  She has been sensitive to small dose changes.   Goal of Therapy:  INR 2-2.5 Monitor platelets by anticoagulation protocol: Yes   Plan:  1) warfarin 2mg  tonight 2) INR daily  Omer Puccinelli D. Amdrew Oboyle, PharmD Clinical Pharmacist Pager: [phone] 01/14/2013 11:12 AM

## 2013-01-15 DIAGNOSIS — I428 Other cardiomyopathies: Secondary | ICD-10-CM

## 2013-01-15 DIAGNOSIS — Z95818 Presence of other cardiac implants and grafts: Secondary | ICD-10-CM

## 2013-01-15 DIAGNOSIS — J90 Pleural effusion, not elsewhere classified: Secondary | ICD-10-CM

## 2013-01-15 DIAGNOSIS — I5022 Chronic systolic (congestive) heart failure: Secondary | ICD-10-CM

## 2013-01-15 LAB — CBC
Hemoglobin: 8.7 g/dL — ABNORMAL LOW (ref 12.0–15.0)
MCH: 28.5 pg (ref 26.0–34.0)
MCHC: 31 g/dL (ref 30.0–36.0)

## 2013-01-15 LAB — GLUCOSE, CAPILLARY: Glucose-Capillary: 98 mg/dL (ref 70–99)

## 2013-01-15 LAB — BASIC METABOLIC PANEL
BUN: 22 mg/dL (ref 6–23)
GFR calc non Af Amer: 52 mL/min — ABNORMAL LOW (ref >90)
Glucose, Bld: 96 mg/dL (ref 70–99)
Potassium: 3.8 mEq/L (ref 3.5–5.1)

## 2013-01-15 LAB — LACTATE DEHYDROGENASE: LDH: 397 U/L — ABNORMAL HIGH (ref 94–250)

## 2013-01-15 LAB — PROTIME-INR: Prothrombin Time: 21.2 seconds — ABNORMAL HIGH (ref 11.6–15.2)

## 2013-01-15 MED ORDER — DIGOXIN 0.0625 MG HALF TABLET
0.0625 mg | ORAL_TABLET | Freq: Every day | ORAL | Status: DC
  Administered 2013-01-16 – 2013-01-18 (×3): 0.0625 mg via ORAL
  Filled 2013-01-15 (×3): qty 1

## 2013-01-15 MED ORDER — AMLODIPINE BESYLATE 5 MG PO TABS
5.0000 mg | ORAL_TABLET | Freq: Every day | ORAL | Status: DC
  Administered 2013-01-16 – 2013-01-18 (×3): 5 mg via ORAL
  Filled 2013-01-15 (×3): qty 1

## 2013-01-15 MED ORDER — WARFARIN SODIUM 2.5 MG PO TABS
2.5000 mg | ORAL_TABLET | Freq: Once | ORAL | Status: AC
  Administered 2013-01-15: 2.5 mg via ORAL
  Filled 2013-01-15: qty 1

## 2013-01-15 NOTE — Progress Notes (Signed)
CARDIAC REHAB PHASE I   PRE:  Rate/Rhythm: 97 first deg    BP: sitting 94    SaO2: 98 2L, 93-94 rA  MODE:  Ambulation: 300 ft   POST:  Rate/Rhythm: 109 first deg    BP: sitting 100     SaO2: 93 RA  Pt progressing well. Moving independently getting OOB and moving to the rollator (a few feet in room). Pt needed assist to twist cable connector due to "it being tight". Also sometimes does not push cables together all the way and needs assist or reminders (trying to twist but its not connected all the way yet). D/c'd O2 for walk and remained around 93 RA. SOB and rest sitting x1. Sts her SOB is about the same with or without O2.  To bed after walk. MAP 100. SaO2 in bed supine 92 RA. Will continue to follow. [id number]  Elissa Lovett [place] CES, ACSM 01/15/2013 3:02 PM

## 2013-01-15 NOTE — Progress Notes (Signed)
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    OT comments  Pt demonstrates improving endurance and activity tolerance.  Able to complete BADLs with supervision, but fatigues afterwards.  Pt able to manage LVAD equipment with supervision.    Follow Up Recommendations  Home health OT;Supervision/Assistance - 24 hour    Barriers to Discharge       Equipment Recommendations  3 in 1 bedside comode;Tub/shower bench    Recommendations for Other Services    Frequency Min 3X/week   Progress towards OT Goals Progress towards OT goals: Progressing toward goals  Plan Discharge plan needs to be updated    Precautions / Restrictions Precautions Precautions: Sternal;Fall Precaution Comments: LVAD Required Braces or Orthoses:  (LVAD equipment/black bag)   Pertinent Vitals/Pain     ADL  Grooming: Supervision/safety;Wash/dry face;Wash/dry hands;Teeth care Where Assessed - Grooming: Unsupported standing Upper Body Bathing: Set up;Supervision/safety Where Assessed - Upper Body Bathing: Unsupported standing Lower Body Bathing: Supervision/safety Where Assessed - Lower Body Bathing: Unsupported sit to stand Upper Body Dressing: Minimal assistance Where Assessed - Upper Body  Dressing: Unsupported sitting Lower Body Dressing: Supervision/safety Where Assessed - Lower Body Dressing: Unsupported sit to stand Toilet Transfer: Supervision/safety Toilet Transfer Method: Sit to Barista: Bedside commode Toileting - Clothing Manipulation and Hygiene: Supervision/safety Where Assessed - Engineer, mining and Hygiene: Standing Transfers/Ambulation Related to ADLs: Supervision ADL Comments: Pt standing at sink completing bathing with CNA.  Pt fatigued afterwards.  Pt completed self test with supervision, and was able to switch controller to battery pack and vise versa with supervision    OT Diagnosis:    OT Problem List:   OT Treatment Interventions:     OT Goals(current goals can now be found in the care plan section) Acute Rehab OT Goals Patient Stated Goal: get back to work at the [employer] and do the wobble OT Goal Formulation: With patient Time For Goal Achievement: 01/17/13 Potential to Achieve Goals: Good ADL Goals Pt Will Perform Grooming: with supervision;with set-up;sitting Pt Will Perform Upper Body Bathing: with set-up;with supervision;sitting Pt Will Transfer to Toilet: with supervision;ambulating;bedside commode Pt Will Perform Toileting - Clothing Manipulation and hygiene: with supervision;sit to/from stand Additional ADL Goal #1: Pt will connect/disconnect to/from battery system with family assisting Additional ADL Goal #2: Pt will be independent in FM exercises to increase strength for LVAD system care  Visit Information  Last OT Received On: 01/15/13 Assistance Needed: +1 History of Present Illness: Kaitlin [name] is a 67 y/o woman with h/o severe HTN, [name], VT s/p Biotronik ICD (2009) and 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 EF 15-25% with mild to moderate RV dysfunction.Underwent HM II LVAD implant (under destination criteria) along with TV repair on 8/18. HeartMate 2 implantation with subsequent emergency centri- mag RVAD because of  RV dysfunction. Patient had stable hemodynamics on BiVAD support.Returned  to OR 8-25 for successful removal of RVAD Responsive but comfortable with IV sedation. Had successful delayed sternal closure 8-28-014     Subjective Data      Prior Functioning       Cognition  Cognition Arousal/Alertness: Awake/alert Behavior During Therapy: WFL for tasks assessed/performed Overall Cognitive Status: Within Functional Limits for tasks assessed    Mobility  Bed Mobility Bed Mobility: Not assessed Transfers Transfers: Sit to Stand;Stand to Sit Sit to Stand: 5: Supervision Stand to Sit: 5: Supervision    Exercises      Balance Balance Balance Assessed: Yes Dynamic Standing Balance Dynamic Standing - Balance Support: No upper extremity supported Dynamic Standing - Level of Assistance: 5: Stand by assistance Dynamic Standing - Balance Activities: Other (comment) (ADL activities) Dynamic Standing - Comments: grooming and ADLs at sink   End of Session OT - End of Session Activity Tolerance: Patient tolerated treatment well Patient left: in chair;with call bell/phone within reach Nurse Communication: Mobility status  GO     [name] M 01/15/2013, 12:13 PM

## 2013-01-15 NOTE — Progress Notes (Signed)
ANTICOAGULATION CONSULT NOTE - Follow Up Consult  Pharmacy Consult for Coumadin Indication: LVAD  No Known Allergies  Patient Measurements: Height: 5\' 9"  (175.3 cm) Weight: 162 lb 12.8 oz (73.846 kg) IBW/kg (Calculated) : 66.2  Vital Signs: Temp: 98.2 F (36.8 C) (09/15 0420) Temp src: Oral (09/15 0420)  Labs:  Recent Labs  01/13/13 0555 01/14/13 1000 01/15/13 0510  HGB 8.4*  --  8.7*  HCT 27.5*  --  28.1*  PLT 403*  --  420*  LABPROT 27.5* 23.3* 21.2*  INR 2.67* 2.15* 1.90*  CREATININE 1.07  --  1.09    Estimated Creatinine Clearance: 53.8 ml/min (by C-G formula based on Cr of 1.09).  Assessment: 67yof s/p LVAD and tricuspid valve repair on 8/18. Coumadin dosing changed to per pharmacy on 9/5.  Anticoagulation for clot prevention. No bleeding issues noted and cbc has remained low but stable.  INR 1.99 today which is down from yesterday.   She has been sensitive to small dose changes.     Goal of Therapy:  INR 2-2.5 Monitor platelets by anticoagulation protocol: Yes   Plan:  1) warfarin 2.5mg  tonight 2) INR daily  Leota Sauers Pharm.D. CPP, BCPS Clinical Pharmacist [phone] 01/15/2013 1:39 PM

## 2013-01-15 NOTE — Progress Notes (Signed)
Physical Therapy Treatment Patient Details Name: Kaitlin [name] MRN: [id number] DOB: 28-Dec-1945 Today's Date: 01/15/2013 Time: 4401-0272 PT Time Calculation (min): 29 min  PT Assessment / Plan / Recommendation  History of Present Illness 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    PT Comments   Patient demonstrates steady progress, with today being by far the best day to this point with mobility and management of LVAD device. Patient was able to increase ambulation with minimal rest breaks (on 2Ls oxygen). She was able to convert to and from power source without any verbal cues or corrections. Patient successfully verbalized equipment needed as well as sequencing of conversion. Patient appeared up beat and motivated throughout today's session.  Will continue to work and progress activity as tolerated.  Follow Up Recommendations  Home health PT;Supervision/Assistance - 24 hour     Does the patient have the potential to tolerate intense rehabilitation     Barriers to Discharge        Equipment Recommendations  Other (comment) (rollator)    Recommendations for Other Services    Frequency Min 5X/week   Progress towards PT Goals Progress towards PT goals: Progressing toward goals  Plan Current plan remains appropriate    Precautions / Restrictions Precautions Precautions: Sternal;Fall Precaution Comments: LVAD Required Braces or Orthoses:  (LVAD equipment/black bag)   Pertinent Vitals/Pain No pain, VSS on  2 liters    Mobility  Bed Mobility Bed Mobility: Not assessed Transfers Transfers: Sit to Stand;Stand to Sit Sit to Stand: 4: Min assist Stand to Sit: 5: Supervision Details for Transfer Assistance: Assist for posterior support to stand from chair Ambulation/Gait Ambulation/Gait Assistance: 5: Supervision Ambulation Distance (Feet): 600 Feet Assistive device: Rollator Ambulation/Gait Assistance Details: one seated rest break, 2 standing rest breaks. Patient with good awareness of fatigue. Gait Pattern: Step-through pattern;Decreased stride length;Narrow base of support Gait velocity: decreased General Gait Details: improved stability, ambulated on 2 liters Stairs: No      PT Goals (current goals can now be found in the care plan section) Acute Rehab PT Goals Patient Stated Goal: get back to work at the [employer] and do the [employer]ble PT Goal Formulation: With patient Time For Goal Achievement: 01/16/13 Potential to Achieve Goals: Good  Visit Information  Last PT Received On: 01/15/13 Assistance Needed: +1 History of Present Illness: 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     Subjective Data  Subjective: I am starting to get it Patient Stated Goal: get back to work at the [employer] and do the [employer] Arousal/Alertness: Awake/alert Behavior During Therapy: [facility] for tasks  assessed/performed Overall Cognitive Status: Within Functional Limits for tasks assessed    Balance   Balance Balance Assessed: Yes Static Standing Balance Static Standing - Balance Support: Bilateral upper extremity supported;During functional activity Static Standing - Level of Assistance: 5: Stand by assistance Static Standing - Comment/# of Minutes: 4 minutes Dynamic Standing Balance Dynamic Standing - Balance Support: No upper extremity supported Dynamic Standing - Level of Assistance: 5: Stand by assistance Dynamic Standing - Balance Activities: Other (comment) (ADL activities) Dynamic Standing - Comments: grooming and ADLs at sink  End of Session PT - End of Session Equipment Utilized During Treatment: Oxygen Activity Tolerance: Patient tolerated treatment well Patient left: in chair;with call bell/phone within reach Nurse Communication: Mobility status   GP     Fabio Asa 01/15/2013, 1:23 PM Charlotte Crumb, PT DPT  [phone]

## 2013-01-15 NOTE — Progress Notes (Signed)
Nursing note: 01/15/13 39 Offered daughter to perform drive line dressing change this evening, she declined at this time, she stated that she wanted other family to be there to watch the dressing change.  Stated would do it tomorrow (tuesday) explained to daughter that if she changed her mind and wanted to do it later this evening to just let the nursing staff know. Will monitor patient. Maxen Rowland, Randall An RN

## 2013-01-15 NOTE — Progress Notes (Signed)
. HeartMate 2 Rounding Note  Subjective:   Postop day #28 HeartMate 2 implantation with subsequent emergency centri- mag RVAD because of  RV dysfunction. Patient  had  stable hemodynamics on BiVAD support.Returned to OR 8-25 for successful removal of RVAD .  Had successful delayed sternal closure  8-28-014  Patient continues to have stable hemodynamics after chest closure Dobutamine has been weaned off- pump parameters are satsifactory on current speed of 8600 rpm Patient now in step down and had uneventful night. No PI events. Mean arterial blood pressure and heart rhythm stable. No fever.  INR is  Low  therapeutic at 2.0  Patient appears stronger and more responsive today and eager to go home later in week   A postop, postextubation 2-D echocardiogram  on 9-2 shows significant improvement in RV function and decrease in tricuspid regurgitation. Repeat 2-D echo  at higher pump speed 9600 RPM indicates good midline position of the interventricular septum, minimal opening of the aortic valve, persistent improvement of the RV    Patient being followed by nutritionist for low albumin, on supplemental protein feedings. Tolerating a dysphagia 3 diet.  UGI bleed from M-W tear- now inactive, on protonix  On coumadin , ASA with goal INR 2.0-2.5 LDH levels are being followed serially and are satisfactory- 397 today   diuresis for fluid overload- on oral spironolactone  WBC >30k> 25>20>18 > 14k  - all antibiotics have been stopped . Old chest tube sites (3) being packed with 2 x 2 gauze wet-to-dry. RVAD cannulation sites are healing. Sternal incision is healing.Drive line site is healing   LVAD INTERROGATION:  HeartMate II LVAD:  Flow 3.4 liters/min, speed 8600 power 3.5, PI 6.1.   Objective:    Vital Signs:   Temp:  [98.2 F (36.8 C)] 98.2 F (36.8 C) (09/15 0420) Weight:  [162 lb 12.8 oz (73.846 kg)] 162 lb 12.8 oz (73.846 kg) (09/15 0420) Last BM Date: 01/14/13 Mean arterial  Pressure 84  Intake/Output:   Intake/Output Summary (Last 24 hours) at 01/15/13 0940 Last data filed at 01/15/13 0421  Gross per 24 hour  Intake      0 ml  Output    850 ml  Net   -850 ml     Physical Exam: General: extubated, responsive, comfortable HEENT: normal Neck: supple. JVP normal  No lymphadenopathy or thryomegaly appreciated. Cor: Mechanical heart sounds with LVAD hum present. Lungs: clear Abdomen: soft, nontender, nondistended. No hepatosplenomegaly. No bruits or masses. Good bowel sounds. Extremities: no cyanosis, clubbing, rash,  Sig edema remains  extremities warm Neuro: moves all 4 extremities  Telemetry: sinus 94/min  Labs: Basic Metabolic Panel:  Recent Labs Lab 01/10/13 0500 01/11/13 0525 01/12/13 0545 01/13/13 0555 01/15/13 0510  NA 138 137 137 139 139  K 3.1* 3.3* 3.7 3.9 3.8  CL 95* 97 98 98 99  CO2 33* 34* 30 31 31   GLUCOSE 92 103* 107* 103* 96  BUN 28* 22 20 21 22   CREATININE 1.08 1.03 1.04 1.07 1.09  CALCIUM 9.3 9.3 9.4 9.6 9.8    Liver Function Tests: pending in am CBC:  Recent Labs Lab 01/10/13 0500 01/11/13 0525 01/12/13 0545 01/13/13 0555 01/15/13 0510  WBC 14.4* 14.5* 14.2* 13.5* 14.0*  HGB 8.2* 8.2* 8.3* 8.4* 8.7*  HCT 26.8* 26.0* 26.2* 27.5* 28.1*  MCV 92.1 91.5 91.6 92.0 92.1  PLT 430* 410* 420* 403* 420*    INR:  Recent Labs Lab 01/11/13 0525 01/12/13 0545 01/13/13 0555 01/14/13 1000 01/15/13  0510  INR 2.40* 2.68* 2.67* 2.15* 1.90*    Other results:  EKG:   Imaging: No results found.   Medications:     Scheduled Medications: . amiodarone  200 mg Per NG tube Daily  . amLODipine  2.5 mg Oral Daily  . antiseptic oral rinse  15 mL Mouth Rinse BID  . aspirin EC  325 mg Oral Daily  . citalopram  20 mg Oral Daily  . digoxin  0.125 mg Oral Daily  . feeding supplement  1 Container Oral TID WC  . feeding supplement  237 mL Oral Q24H  . ferrous fumarate-b12-vitamic C-folic acid  1 capsule Oral TID PC  .  insulin aspart  0-24 Units Subcutaneous TID AC & HS  . pantoprazole  40 mg Oral BID  . sildenafil  40 mg Per Tube TID  . sodium chloride  10 mL Intravenous Q12H  . spironolactone  25 mg Oral Daily  . Warfarin - Pharmacist Dosing Inpatient   Does not apply q1800    Infusions: . sodium chloride    . 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  No programming changes were made.  The LVAD is functioning within specified parameters.  The patient performs LVAD self-test daily.  LVAD interrogation was negative for any significant power changes, alarms or PI events/speed drops.  LVAD equipment check completed and is in good working order.  Back-up equipment present.   LVAD education done on emergency procedures and precautions and reviewed exit site care.  Length of Stay: 38  [name] III,Tarsha Blando 01/15/2013, 9:39 AM

## 2013-01-15 NOTE — Progress Notes (Addendum)
NUTRITION FOLLOW-UP  INTERVENTION: 1.  Supplements; discontinue Glucerna Shake po daily, each supplement provides 220 kcal and 10 grams of protein and Ensure Pudding po TID, each supplement provides 170 kcal and 4 grams of protein.  2.  General healthful diet; encourage intake as able.  NUTRITION DIAGNOSIS: Inadequate oral intake now related to variable appetite as evidenced by variable meal completion. Ongoing.  Goal: Intake to meet >90% of estimated nutrition needs. Unmet.  Monitor:  labs, weight trends, post-op healing and recovery, tolerance of supplements, PO intake  ASSESSMENT: LVAD inserted on 8/18. Pt found to have R ventricular dysfunction s/p LVAD implantation, required trip to OR later that evening for placement of RVAD. RVAD removed 8/25. Underwent sternotomy incision closure and wound VAC wash out 8/26.   Pt continues Regular diet since d/c of NGT (9/5). PO has improved to 50-90% of meals. Currently ordered for Ensure Pudding TID and Glucerna Shake daily however pt is refusing. Pt's wt has decreased to 162 lbs but is within 5 lbs of admission wt >1 month ago.   Note d/c date target is Thursday. RD to follow care plan.  Will also continue to monitor for adequacy of intake without supplements.  Height: Ht Readings from Last 1 Encounters:  12/22/12 5\' 9"  (1.753 m)    Weight: Wt Readings from Last 1 Encounters:  01/15/13 162 lb 12.8 oz (73.846 kg)  Admission wt: 165 lb.  Stable  BMI:  Body mass index is 29.3 kg/(m^2) - using admission weight.  Estimated Nutritional Needs: Kcal: 1800 - 2000 Protein: 90 - 125 gm Fluid: 1.5 - 2 L  Skin:  Abdomen incision Chest incision  Diet Order: General   EDUCATION NEEDS: -Education needs addressed. Provided many handouts on heart healthy eating, including label reading tips per patient request on 8/15.   Intake/Output Summary (Last 24 hours) at 01/15/13 1157 Last data filed at 01/15/13 1056  Gross per 24 hour  Intake     240 ml  Output   1350 ml  Net  -1110 ml    Last BM: 9/14  Labs:   Recent Labs Lab 01/12/13 0545 01/13/13 0555 01/15/13 0510  NA 137 139 139  K 3.7 3.9 3.8  CL 98 98 99  CO2 30 31 31   BUN 20 21 22   CREATININE 1.04 1.07 1.09  CALCIUM 9.4 9.6 9.8  GLUCOSE 107* 103* 96    CBG (last 3)   Recent Labs  01/14/13 1622 01/14/13 2101 01/15/13 0617  GLUCAP 97 116* 98   Prealbumin  Date/Time Value Range Status  01/09/2013  5:00 AM 20.9  17.0 - 34.0 mg/dL Final     Performed at [facility]   Triglycerides  Date/Time Value Range Status  12/25/2012  3:39 AM 159* <150 mg/dL Final    Scheduled Meds: . amiodarone  200 mg Per NG tube Daily  . [START ON 01/16/2013] amLODipine  5 mg Oral Daily  . antiseptic oral rinse  15 mL Mouth Rinse BID  . aspirin EC  325 mg Oral Daily  . citalopram  20 mg Oral Daily  . [START ON 01/16/2013] digoxin  0.0625 mg Oral Daily  . feeding supplement  1 Container Oral TID WC  . feeding supplement  237 mL Oral Q24H  . ferrous fumarate-b12-vitamic C-folic acid  1 capsule Oral TID PC  . insulin aspart  0-24 Units Subcutaneous TID AC & HS  . pantoprazole  40 mg Oral BID  . sildenafil  40 mg Per Tube  TID  . sodium chloride  10 mL Intravenous Q12H  . spironolactone  25 mg Oral Daily  . Warfarin - Pharmacist Dosing Inpatient   Does not apply q1800    Continuous Infusions: . sodium chloride    . sodium chloride 250 mL (01/08/13 2157)    Loyce Dys, MS RD LDN Clinical Inpatient Dietitian Pager: [phone] Weekend/After hours pager: [phone]

## 2013-01-15 NOTE — Progress Notes (Signed)
Nursing note  Patient sternal skill sutures removed as ordered. Inc approximated will continue to monitor patient. Eastyn Dattilo, Randall An RN

## 2013-01-15 NOTE — Progress Notes (Signed)
Patient ID: Kaitlin [name], female   DOB: [birth year], 67 y.o.   MRN: [id number] HeartMate 2 Rounding Note  Subjective:    Ms. 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 EF 15-25% with mild to moderate RV dysfunction  8/18 Underwent HM II LVAD implant (under destination criteria) along with TV repair.  Taken back to the OR for RVAD support.  8/25 was taken back to OR for RVAD explant. 8/26 EGD which showed MW tear in esophagus. Injected and clipped. No further bleeding.  8/27  Back to OR for chest closure. 8/31 extubated  Walked halls yesterday, felt good with no dyspnea.  No PI events since yesterday morning.  Weight down 2 lbs.  MAP high 80s-90s.    LVAD INTERROGATION:   HeartMate II LVAD:  Flow 4.5  Liters/min  speed 8600  power 4.7 PI 5.5  No new PI events  Objective:    Vital Signs:   Temp:  [98.2 F (36.8 C)] 98.2 F (36.8 C) (09/15 0420) Weight:  [162 lb 12.8 oz (73.846 kg)] 162 lb 12.8 oz (73.846 kg) (09/15 0420) Last BM Date: 01/14/13 Mean arterial Pressure 80s  Intake/Output:   Intake/Output Summary (Last 24 hours) at 01/15/13 1133 Last data filed at 01/15/13 1056  Gross per 24 hour  Intake    240 ml  Output   1350 ml  Net  -1110 ml     Physical Exam: MAP 80s-90s General: lying in bed family at bedside HEENT: normal Neck: supple. JVP 8 Carotids 2+ bilat; no bruits. No lymphadenopathy or thryomegaly appreciated.  Cor: LVAD hum present. Lungs: CTA Abdomen: soft, nontender, normal BS Driveline: C/D/I; securement device intact  Extremities: no cyanosis, clubbing, rash, tr edema.  Neuro: awake follows commands moves all 4 extremities w/o difficulty. Affect pleasant  Telemetry: NSR 90s  Labs: Basic Metabolic Panel:  Recent Labs Lab 01/10/13 0500 01/11/13 0525 01/12/13 0545 01/13/13 0555 01/15/13 0510  NA 138 137 137 139 139  K 3.1* 3.3* 3.7 3.9 3.8  CL 95* 97 98 98 99  CO2 33* 34* 30 31  31   GLUCOSE 92 103* 107* 103* 96  BUN 28* 22 20 21 22   CREATININE 1.08 1.03 1.04 1.07 1.09  CALCIUM 9.3 9.3 9.4 9.6 9.8    Liver Function Tests: No results found for this basename: AST, ALT, ALKPHOS, BILITOT, PROT, ALBUMIN,  in the last 168 hours No results found for this basename: LIPASE, AMYLASE,  in the last 168 hours No results found for this basename: AMMONIA,  in the last 168 hours  CBC:  Recent Labs Lab 01/10/13 0500 01/11/13 0525 01/12/13 0545 01/13/13 0555 01/15/13 0510  WBC 14.4* 14.5* 14.2* 13.5* 14.0*  HGB 8.2* 8.2* 8.3* 8.4* 8.7*  HCT 26.8* 26.0* 26.2* 27.5* 28.1*  MCV 92.1 91.5 91.6 92.0 92.1  PLT 430* 410* 420* 403* 420*    INR:  Recent Labs Lab 01/11/13 0525 01/12/13 0545 01/13/13 0555 01/14/13 1000 01/15/13 0510  INR 2.40* 2.68* 2.67* 2.15* 1.90*    Imaging: No results found.   Medications:     Scheduled Medications: . amiodarone  200 mg Per NG tube Daily  . amLODipine  2.5 mg Oral Daily  . antiseptic oral rinse  15 mL Mouth Rinse BID  . aspirin EC  325 mg Oral Daily  . citalopram  20 mg Oral Daily  . digoxin  0.125 mg Oral Daily  . feeding  supplement  1 Container Oral TID WC  . feeding supplement  237 mL Oral Q24H  . ferrous fumarate-b12-vitamic C-folic acid  1 capsule Oral TID PC  . insulin aspart  0-24 Units Subcutaneous TID AC & HS  . pantoprazole  40 mg Oral BID  . sildenafil  40 mg Per Tube TID  . sodium chloride  10 mL Intravenous Q12H  . spironolactone  25 mg Oral Daily  . Warfarin - Pharmacist Dosing Inpatient   Does not apply q1800    Infusions: . sodium chloride    . sodium chloride 250 mL (01/08/13 2157)    PRN Medications: acetaminophen (TYLENOL) oral liquid 160 mg/5 mL, ALPRAZolam, ondansetron (ZOFRAN) IV, oxyCODONE-acetaminophen, sodium chloride, sodium chloride, traMADol   Assessment:   1. Acute on chronic systolic HF with biventricular HF EF 10%  --S/p HM II VAD with TV repair 12/18/12  --s/p RVAD. -  explanted 8/25 2. 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 Cardiogenic shock  4. VT s/p Biotronik ICD on amio 5. Chronic renal failure, stage III 6. 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 ABL anemia  8. PNA 9. Severe protein calorie malnutrition - on TPN 10. Hyponatremia/Hypokalemia 11. 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 Acute respiratory failure  Plan/Discussion:    Continues to progress. Weight down another 2 lbs. Can keep off Demadex for now (follow weight and symptoms).  As MAP still > 90 at times, will increase amlodipine to 5.   Continue ASA and warfarin, INR 1.9 (target 2-3). LDH stable.   Digoxin level was 1.8 over weekend.  Will decrease digoxin to every other day.   Continue PT. Target discharge on Thursday. Will plan for ramp study on Wednesday.    Rohin Krejci,MD  11:33 AM Length of Stay: 38 VAD Team Pager [phone] (7am - 7am) VAD Issue Non-VAD issues HF Pager [phone]

## 2013-01-16 DIAGNOSIS — I5022 Chronic systolic (congestive) heart failure: Secondary | ICD-10-CM

## 2013-01-16 DIAGNOSIS — Z95818 Presence of other cardiac implants and grafts: Secondary | ICD-10-CM

## 2013-01-16 DIAGNOSIS — J90 Pleural effusion, not elsewhere classified: Secondary | ICD-10-CM

## 2013-01-16 DIAGNOSIS — I428 Other cardiomyopathies: Secondary | ICD-10-CM

## 2013-01-16 LAB — GLUCOSE, CAPILLARY
Glucose-Capillary: 94 mg/dL (ref 70–99)
Glucose-Capillary: 122 mg/dL — ABNORMAL HIGH (ref 70–99)
Glucose-Capillary: 112 mg/dL — ABNORMAL HIGH (ref 70–99)

## 2013-01-16 LAB — PROTIME-INR: Prothrombin Time: 21.1 seconds — ABNORMAL HIGH (ref 11.6–15.2)

## 2013-01-16 MED ORDER — TORSEMIDE 20 MG PO TABS
20.0000 mg | ORAL_TABLET | ORAL | Status: DC
  Administered 2013-01-16 – 2013-01-18 (×2): 20 mg via ORAL
  Filled 2013-01-16 (×2): qty 1

## 2013-01-16 MED ORDER — INFLUENZA VAC SPLIT QUAD 0.5 ML IM SUSP
0.5000 mL | INTRAMUSCULAR | Status: AC
  Filled 2013-01-16 (×2): qty 0.5

## 2013-01-16 MED ORDER — POTASSIUM CHLORIDE CRYS ER 20 MEQ PO TBCR
20.0000 meq | EXTENDED_RELEASE_TABLET | Freq: Once | ORAL | Status: AC
  Administered 2013-01-16: 20 meq via ORAL
  Filled 2013-01-16: qty 1

## 2013-01-16 MED ORDER — WARFARIN SODIUM 4 MG PO TABS
4.0000 mg | ORAL_TABLET | Freq: Once | ORAL | Status: AC
  Administered 2013-01-16: 4 mg via ORAL
  Filled 2013-01-16: qty 1

## 2013-01-16 NOTE — Progress Notes (Signed)
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    OT comments  Pt continues to demonstrate excellent progress.  She is able to switch controller from power to battery with supervision, is able to state all precautions, and manage equipment with supervision.  She requires supervision for BADLs except toilet transfers and will need 3in1 to elevate commode height.    Follow Up Recommendations  Home health OT;Supervision/Assistance - 24 hour    Barriers to Discharge       Equipment Recommendations  3 in 1 bedside comode;Tub/shower bench    Recommendations for Other Services    Frequency Min 3X/week   Progress towards OT Goals Progress towards OT goals: Progressing toward goals  Plan Discharge plan remains appropriate    Precautions / Restrictions Precautions Precautions: Sternal;Fall Precaution Comments: LVAD Required Braces or Orthoses: Other Brace/Splint (LVAD equipment) Restrictions Weight Bearing Restrictions:  (sternal precautions )   Pertinent Vitals/Pain     ADL  Upper Body Dressing: Supervision/safety Where Assessed - Upper Body Dressing: Unsupported sitting Lower Body Dressing: Supervision/safety Where Assessed - Lower Body Dressing: Unsupported sit to  stand Toilet Transfer: Moderate assistance Toilet Transfer Method: Sit to stand;Stand pivot Toilet Transfer Equipment: Regular height toilet Toileting - Clothing Manipulation and Hygiene: Supervision/safety Where Assessed - Toileting Clothing Manipulation and Hygiene: Standing Equipment Used: Rolling walker;Other (comment) (LVAD equipment) Transfers/Ambulation Related to ADLs: Supervision ADL Comments: Pt reports she again stood at sink to complete bathing and grooming.  She is able to independently state and demonstrate understanding of sternal precautions.  She is able to perform self test with supervision and able to switch controller from power supply to battery with supervision.  Packed black bag with supervision.  Pt. required mod A for sit to stand from standard toilet.  Discussed use of 3in1 for home use, and pt able to verbalize understanding.     OT Diagnosis:    OT Problem List:   OT Treatment Interventions:     OT Goals(current goals can now be found in the care plan section) Acute Rehab OT Goals Patient Stated Goal: get back to work at the [employer] and do the wobble OT Goal Formulation: With patient Time For Goal Achievement: 01/17/13 Potential to Achieve Goals: Good ADL Goals Pt Will Perform Grooming: with supervision;with set-up;sitting Pt Will Perform Upper Body Bathing: with set-up;with supervision;sitting Pt Will Transfer to Toilet: with supervision;ambulating;bedside commode Pt Will Perform Toileting - Clothing Manipulation and hygiene: with supervision;sit to/from stand Additional ADL Goal #1: Pt will connect/disconnect to/from battery system with family assisting Additional ADL Goal #2: Pt will be independent in FM exercises to increase strength for LVAD system care  Visit Information  Last OT Received On: 01/16/13 Assistance Needed: +1 History of Present Illness: Kaitlin [name] is a 67 y/o woman with h/o severe HTN, [name], VT s/p Biotronik ICD (2009) and CHF  due to  NICM. EF 15-25% with mild to moderate RV dysfunction.Underwent HM II LVAD implant (under destination criteria) along with TV repair on 8/18. HeartMate 2 implantation with subsequent emergency centri- mag RVAD because of RV dysfunction. Patient had stable hemodynamics on BiVAD support.Returned to OR 8-25 for successful removal of RVAD Responsive but comfortable with IV sedation. Had successful delayed sternal closure 8-28-014     Subjective Data      Prior Functioning       Cognition  Cognition Arousal/Alertness: Awake/alert Behavior During Therapy: WFL for tasks assessed/performed Overall Cognitive Status: Within Functional Limits for tasks assessed    Mobility  Bed Mobility Bed Mobility: Not assessed Transfers Transfers: Sit to Stand;Stand to Sit Sit to Stand: 4: Min guard;3: Mod assist;From chair/3-in-1;From toilet;Without upper extremity assist Stand to Sit: 5: Supervision;Without upper extremity assist;To chair/3-in-1;To toilet Details for Transfer Assistance: mod A from standard toilet; min guard from recliner to move to standing     Exercises      Balance     End of Session OT - End of Session Equipment Utilized During Treatment: Other (comment) (LVAD) Activity Tolerance: Patient tolerated treatment well Patient left: in chair;with call bell/phone within reach Nurse Communication: Mobility status  GO     Maggi Hershkowitz M 01/16/2013, 11:21 AM

## 2013-01-16 NOTE — Progress Notes (Signed)
Patient ID: SOMYA JAUREGUI, female   DOB: [birth year], 67 y.o.   MRN: [id number] HeartMate 2 Rounding Note  Subjective:    Ms. 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 EF 15-25% with mild to moderate RV dysfunction  8/18 Underwent HM II LVAD implant (under destination criteria) along with TV repair.  Taken back to the OR for RVAD support.  8/25 was taken back to OR for RVAD explant. 8/26 EGD which showed MW tear in esophagus. Injected and clipped. No further bleeding.  8/27  Back to OR for chest closure. 8/31 extubated  Walked halls yesterday, felt good with no dyspnea.  No PI events last 24 hrs.  Weight up 1 lb.  MAP 80s.    LVAD INTERROGATION:   HeartMate II LVAD:  Flow 3.9  Liters/min  speed 8600  power 4.1 PI 6.1  No new PI events  Objective:    Vital Signs:   Temp:  [98 F (36.7 C)-98.8 F (37.1 C)] 98.4 F (36.9 C) (09/16 0438) Weight:  [163 lb 6.4 oz (74.118 kg)] 163 lb 6.4 oz (74.118 kg) (09/16 0438) Last BM Date: 01/14/13 Mean arterial Pressure 80s  Intake/Output:   Intake/Output Summary (Last 24 hours) at 01/16/13 0945 Last data filed at 01/16/13 0800  Gross per 24 hour  Intake    720 ml  Output    900 ml  Net   -180 ml     Physical Exam: MAP 80s-90s General: lying in bed family at bedside HEENT: normal Neck: supple. JVP 8 Carotids 2+ bilat; no bruits. No lymphadenopathy or thryomegaly appreciated.  Cor: LVAD hum present. Lungs: CTA Abdomen: soft, nontender, normal BS Driveline: C/D/I; securement device intact  Extremities: no cyanosis, clubbing, rash, tr edema.  Neuro: awake follows commands moves all 4 extremities w/o difficulty. Affect pleasant  Telemetry: NSR 90s  Labs: Basic Metabolic Panel:  Recent Labs Lab 01/10/13 0500 01/11/13 0525 01/12/13 0545 01/13/13 0555 01/15/13 0510  NA 138 137 137 139 139  K 3.1* 3.3* 3.7 3.9 3.8  CL 95* 97 98 98 99  CO2 33* 34* 30 31 31    GLUCOSE 92 103* 107* 103* 96  BUN 28* 22 20 21 22   CREATININE 1.08 1.03 1.04 1.07 1.09  CALCIUM 9.3 9.3 9.4 9.6 9.8    Liver Function Tests: No results found for this basename: AST, ALT, ALKPHOS, BILITOT, PROT, ALBUMIN,  in the last 168 hours No results found for this basename: LIPASE, AMYLASE,  in the last 168 hours No results found for this basename: AMMONIA,  in the last 168 hours  CBC:  Recent Labs Lab 01/10/13 0500 01/11/13 0525 01/12/13 0545 01/13/13 0555 01/15/13 0510  WBC 14.4* 14.5* 14.2* 13.5* 14.0*  HGB 8.2* 8.2* 8.3* 8.4* 8.7*  HCT 26.8* 26.0* 26.2* 27.5* 28.1*  MCV 92.1 91.5 91.6 92.0 92.1  PLT 430* 410* 420* 403* 420*    INR:  Recent Labs Lab 01/12/13 0545 01/13/13 0555 01/14/13 1000 01/15/13 0510 01/16/13 0505  INR 2.68* 2.67* 2.15* 1.90* 1.89*    Imaging: No results found.   Medications:     Scheduled Medications: . amiodarone  200 mg Per NG tube Daily  . amLODipine  5 mg Oral Daily  . antiseptic oral rinse  15 mL Mouth Rinse BID  . aspirin EC  325 mg Oral Daily  . citalopram  20 mg Oral Daily  . digoxin  0.0625 mg Oral  Daily  . ferrous fumarate-b12-vitamic C-folic acid  1 capsule Oral TID PC  . [START ON 01/17/2013] influenza vac split quadrivalent PF  0.5 mL Intramuscular Tomorrow-1000  . insulin aspart  0-24 Units Subcutaneous TID AC & HS  . pantoprazole  40 mg Oral BID  . sildenafil  40 mg Per Tube TID  . sodium chloride  10 mL Intravenous Q12H  . spironolactone  25 mg Oral Daily  . torsemide  20 mg Oral QODAY  . warfarin  4 mg Oral ONCE-1800  . Warfarin - Pharmacist Dosing Inpatient   Does not apply q1800    Infusions: . sodium chloride    . sodium chloride 250 mL (01/08/13 2157)    PRN Medications: acetaminophen (TYLENOL) oral liquid 160 mg/5 mL, ALPRAZolam, ondansetron (ZOFRAN) IV, oxyCODONE-acetaminophen, sodium chloride, sodium chloride, traMADol   Assessment:   1. Acute on chronic systolic HF with biventricular HF  EF 10%  --S/p HM II VAD with TV repair 12/18/12  --s/p RVAD. - explanted 8/25 2. 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 Cardiogenic shock  4. VT s/p Biotronik ICD on amio 5. Chronic renal failure, stage III 6. 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 ABL anemia  8. PNA 9. Severe protein calorie malnutrition - on TPN 10. Hyponatremia/Hypokalemia 11. 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 Acute respiratory failure  Plan/Discussion:    Weight up 1 lb.  Will add back torsemide 20 mg every other day. BMET tomorrow.  Continue ASA and warfarin, INR 1.9 (target 2-3).   Continue PT. Target discharge on Thursday. Will plan for ramp study on Wednesday.    Makaiah Terwilliger,MD  9:45 AM Length of Stay: 39 VAD Team Pager [phone] (7am - 7am) VAD Issue Non-VAD issues HF Pager [phone]

## 2013-01-16 NOTE — Progress Notes (Signed)
Chaplain made a follow up to see the patient. The patient's daughter, son-in-law and  Family friend were in the room. Chaplain offered emotional and pastoral care to the patient.   01/16/13 1400  Clinical Encounter Type  Visited With Patient and family together  Visit Type Follow-up;Social support  Spiritual Encounters  Spiritual Needs Emotional

## 2013-01-16 NOTE — Progress Notes (Signed)
. HeartMate 2 Rounding Note  Subjective:   Postop day #29 HeartMate 2 implantation with subsequent emergency centri- mag RVAD because of  RV dysfunction. Patient  had  stable hemodynamics on BiVAD support.Returned to OR 8-25 for successful removal of RVAD .  Had successful delayed sternal closure  8-28-014  Patient continues to have stable hemodynamics after chest closure- all inotropes weaned off-- Dobutamine has been weaned off- pump parameters are satsifactory on current speed of 8600 rpm Patient now in step down and had uneventful night. No PI events. Mean arterial blood pressure and heart rhythm stable. No fever. Discharge planning almost complete  INR is  Low  therapeutic at 2.0- coumadin 4mg  tonite  Patient appears stronger and more responsive today and eager to go home later in week- walked > 300 feet   A postop, postextubation 2-D echocardiogram  on 9-2 shows significant improvement in RV function and decrease in tricuspid regurgitation. Repeat 2-D echo  at higher pump speed 9600 RPM indicates good midline position of the interventricular septum, minimal opening of the aortic valve, persistent improvement of the RV Pre-discharge ramp  Echo study planned tomorrow   Patient being followed by nutritionist for low albumin, on supplemental protein feedings. Tolerating a dysphagia 3 diet.  UGI bleed from M-W tear- now inactive, on protonix  On coumadin , ASA with goal INR 2.0-2.5 LDH levels are being followed serially and are satisfactory- 397 last LDH   diuresis for fluid overload- on oral spironolactone  WBC >30k> 25>20>18 > 14k  - all antibiotics have been stopped . Old chest tube sites (3) being packed with 2 x 2 gauze wet-to-dry. RVAD cannulation sites are healing. Sternal incision is healing.Drive line site is healing- family instructed on driveline dressing change   LVAD INTERROGATION:  HeartMate II LVAD:  Flow 4.2 liters/min, speed 8600 power 3.3, PI 6.1.   Objective:     Vital Signs:   Temp:  [98 F (36.7 C)-98.8 F (37.1 C)] 98.4 F (36.9 C) (09/16 0438) Weight:  [163 lb 6.4 oz (74.118 kg)] 163 lb 6.4 oz (74.118 kg) (09/16 0438) Last BM Date: 01/14/13 Mean arterial Pressure 84  Intake/Output:   Intake/Output Summary (Last 24 hours) at 01/16/13 0915 Last data filed at 01/15/13 2100  Gross per 24 hour  Intake    480 ml  Output    900 ml  Net   -420 ml     Physical Exam: General: extubated, responsive, comfortable HEENT: normal Neck: supple. JVP normal  No lymphadenopathy or thryomegaly appreciated. Cor: Mechanical heart sounds with LVAD hum present. Lungs: clear Abdomen: soft, nontender, nondistended. No hepatosplenomegaly. No bruits or masses. Good bowel sounds. Extremities: no cyanosis, clubbing, rash,  Sig edema remains  extremities warm Neuro: moves all 4 extremities  Telemetry: sinus 94/min  Labs: Basic Metabolic Panel:  Recent Labs Lab 01/10/13 0500 01/11/13 0525 01/12/13 0545 01/13/13 0555 01/15/13 0510  NA 138 137 137 139 139  K 3.1* 3.3* 3.7 3.9 3.8  CL 95* 97 98 98 99  CO2 33* 34* 30 31 31   GLUCOSE 92 103* 107* 103* 96  BUN 28* 22 20 21 22   CREATININE 1.08 1.03 1.04 1.07 1.09  CALCIUM 9.3 9.3 9.4 9.6 9.8    Liver Function Tests: pending in am CBC:  Recent Labs Lab 01/10/13 0500 01/11/13 0525 01/12/13 0545 01/13/13 0555 01/15/13 0510  WBC 14.4* 14.5* 14.2* 13.5* 14.0*  HGB 8.2* 8.2* 8.3* 8.4* 8.7*  HCT 26.8* 26.0* 26.2* 27.5* 28.1*  MCV  92.1 91.5 91.6 92.0 92.1  PLT 430* 410* 420* 403* 420*    INR:  Recent Labs Lab 01/12/13 0545 01/13/13 0555 01/14/13 1000 01/15/13 0510 01/16/13 0505  INR 2.68* 2.67* 2.15* 1.90* 1.89*    Other results:  EKG:   Imaging: No results found.   Medications:     Scheduled Medications: . amiodarone  200 mg Per NG tube Daily  . amLODipine  5 mg Oral Daily  . antiseptic oral rinse  15 mL Mouth Rinse BID  . aspirin EC  325 mg Oral Daily  . citalopram  20  mg Oral Daily  . digoxin  0.0625 mg Oral Daily  . ferrous fumarate-b12-vitamic C-folic acid  1 capsule Oral TID PC  . insulin aspart  0-24 Units Subcutaneous TID AC & HS  . pantoprazole  40 mg Oral BID  . sildenafil  40 mg Per Tube TID  . sodium chloride  10 mL Intravenous Q12H  . spironolactone  25 mg Oral Daily  . warfarin  4 mg Oral ONCE-1800  . Warfarin - Pharmacist Dosing Inpatient   Does not apply q1800    Infusions: . sodium chloride    . 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  No programming changes were made.  The LVAD is functioning within specified parameters.  The patient performs LVAD self-test daily.  LVAD interrogation was negative for any significant power changes, alarms or PI events/speed drops.  LVAD equipment check completed and is in good working order.  Back-up equipment present.   LVAD education done on emergency procedures and precautions and reviewed exit site care.  Length of Stay: 39  [name] III,Kadelyn Dimascio 01/16/2013, 9:15 AM

## 2013-01-16 NOTE — Progress Notes (Signed)
ANTICOAGULATION CONSULT NOTE - Follow Up Consult  Pharmacy Consult for Coumadin Indication: LVAD  No Known Allergies  Patient Measurements: Height: 5\' 9"  (175.3 cm) Weight: 163 lb 6.4 oz (74.118 kg) IBW/kg (Calculated) : 66.2  Vital Signs: Temp: 98.4 F (36.9 C) (09/16 0438) Temp src: Oral (09/16 0438)  Labs:  Recent Labs  01/14/13 1000 01/15/13 0510 01/16/13 0505  HGB  --  8.7*  --   HCT  --  28.1*  --   PLT  --  420*  --   LABPROT 23.3* 21.2* 21.1*  INR 2.15* 1.90* 1.89*  CREATININE  --  1.09  --     Estimated Creatinine Clearance: 53.8 ml/min (by C-G formula based on Cr of 1.09).  Assessment: 67yof s/p LVAD and tricuspid valve repair on 8/18. Coumadin dosing changed to per pharmacy on 9/5.  Anticoagulation for clot prevention. No bleeding issues noted and cbc has remained low but stable.  INR 1.89 today which is stable from yesterday but below goal.   She has been sensitive to small dose changes.   Will give a little boost today to push into range and nay be stable on 2mg  daily thereafter.    Goal of Therapy:  INR 2-2.5 Monitor platelets by anticoagulation protocol: Yes   Plan:  1) warfarin 4 mg tonight 2) INR daily  Leota Sauers Pharm.D. CPP, BCPS Clinical Pharmacist [phone] 01/16/2013 8:07 AM

## 2013-01-16 NOTE — Progress Notes (Signed)
Physical Therapy Treatment Patient Details Name: Kaitlin [name] MRN: [id number] DOB: [birth year] Today's Date: 01/16/2013 Time: 8469-6295 PT Time Calculation (min): 26 min  PT Assessment / Plan / Recommendation  History of Present Illness Ms. 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    PT Comments   Upon reassessment, Ms. Schoch is making steady progress towards discharge goals. She has met supervision levels with mobility and we have upgraded goals to Mod I levels. We will work to progress to stair negotiation prior to discharge. Ms. Oshiro was able to ambulate today without any assistive device, but did demonstrate some increased instability.  She was also able to verbalize conversion of power process and demonstrates improved understanding of LVAD device. Her questions regarding her device have progressed to anticipatory state.  Overall, Ms. Markson has made significant improvements in the past week. We will continue to work with her to progress activity as tolerated with anticipation for discharge.   Follow Up Recommendations  Home health PT;Supervision/Assistance - 24 hour           Equipment Recommendations  Other (comment) (rollator)       Frequency Min 5X/week   Progress towards PT Goals Progress towards PT goals: Progressing toward goals  Plan Current plan remains appropriate    Precautions / Restrictions Precautions Precautions: Sternal;Fall Precaution Comments: LVAD Required Braces or Orthoses:  Other Brace/Splint (LVAD equipment)   Pertinent Vitals/Pain No pain at this time    Mobility  Bed Mobility Bed Mobility: Not assessed Transfers Transfers: Sit to Stand;Stand to Sit Sit to Stand: 5: Supervision Stand to Sit: 5: Supervision Details for Transfer Assistance: able to perform from chair without assist today Ambulation/Gait Ambulation/Gait Assistance: 5: Supervision;4: Min guard Ambulation Distance (Feet): 160 Feet Assistive device: None Ambulation/Gait Assistance Details: min guard and VCs for steady pacing during ambulation without assistive device Gait Pattern: Step-through pattern;Decreased stride length;Narrow base of support Gait velocity: decreased General Gait Details: some instability noted when not using device, ambulated without oxygen today Stairs: No    Exercises Other Exercises Other Exercises: Pt verbalized LVAD device management without cues.      PT Goals (current goals can now be found in the care plan section) Acute Rehab PT Goals Patient Stated Goal: get back to work at the [employer] and do the wobble PT Goal Formulation: With patient Time For Goal Achievement: 01/23/13 Potential to Achieve Goals: Good  Visit Information  Last PT Received On: 01/16/13 Assistance Needed: +1 History of Present Illness: Ms. 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     Subjective Data  Subjective: I feel pretty good, just tried  Patient Stated Goal: get back to work at the [employer] and do the  [employer] Arousal/Alertness: Awake/alert Behavior During Therapy: WFL for tasks assessed/performed Overall Cognitive Status: Within Functional Limits for tasks assessed    Balance  Balance Balance Assessed: Yes Static Standing Balance Static Standing - Balance Support: Bilateral upper extremity supported;During functional activity Static Standing - Level of Assistance: 5: Stand by assistance Static Standing - Comment/# of Minutes: 2 minutes for one standing rest break High Level Balance High Level Balance Activites: Side stepping;Backward walking;Direction changes;Turns High Level Balance Comments: some instability noted with higher level balance activities not using assistive device, Min Guard  End of Session PT - End of Session Activity Tolerance: Patient tolerated treatment well Patient left: in chair;with call bell/phone within reach Nurse Communication: Mobility status   GP     Fabio Asa 01/16/2013, 2:07 PM Charlotte Crumb, PT DPT  [phone]

## 2013-01-16 NOTE — Progress Notes (Signed)
CARDIAC REHAB PHASE I   PRE:  Rate/Rhythm: 86 first deg    BP: sitting 86     SaO2: 93 RA  MODE:  Ambulation: 300 ft   POST:  Rate/Rhythm: 110    BP: sitting 100     SaO2: 92 RA  Pt sts she walked x2 already today and tired but willing. Connected to batteries perfectly, stood independently. Used RW, no assist needed. Sat x1 after x1 lap around circle. Needed a reminder to lock rollator before sitting. Walked another lap without problems. To EOB and her daughter Joni Reining transferred back to power module. Pt able to instruct her. To BSC then bed. MAP elevated to 100. Tired but progressing very well. Discussed sternal precautions and IS for home. [phone] Elissa Lovett [place] CES, ACSM 01/16/2013 1:58 PM

## 2013-01-16 NOTE — Progress Notes (Signed)
01/16/2013 6:25 PM Nursing note Pt. Mentioned to RN that she has been experiencing loss of sensation to right upper thigh starting early this morning. She states she did not mention this to the MD during rounds. Sensation intact on right lower extremity as well as palpable DP pulses noted. No other neurological deficits noted. Pt. Able to move RLE without difficulty. PRN pain medication given and pt. Repositioned. Upon reassessment, pt. States she is having some relief. Dr. Dorris Fetch paged and made aware. No new orders received at this time. Will continue to closely monitor patient.  Jeramyah Goodpasture, Blanchard Kelch

## 2013-01-17 DIAGNOSIS — I369 Nonrheumatic tricuspid valve disorder, unspecified: Secondary | ICD-10-CM

## 2013-01-17 DIAGNOSIS — I428 Other cardiomyopathies: Secondary | ICD-10-CM

## 2013-01-17 DIAGNOSIS — Z95818 Presence of other cardiac implants and grafts: Secondary | ICD-10-CM

## 2013-01-17 DIAGNOSIS — I5022 Chronic systolic (congestive) heart failure: Secondary | ICD-10-CM

## 2013-01-17 LAB — COMPREHENSIVE METABOLIC PANEL
ALT: 29 U/L (ref 0–35)
AST: 22 U/L (ref 0–37)
Albumin: 3.3 g/dL — ABNORMAL LOW (ref 3.5–5.2)
Alkaline Phosphatase: 147 U/L — ABNORMAL HIGH (ref 39–117)
BUN: 20 mg/dL (ref 6–23)
CO2: 28 mEq/L (ref 19–32)
Calcium: 9.8 mg/dL (ref 8.4–10.5)
Chloride: 97 mEq/L (ref 96–112)
Creatinine, Ser: 1.19 mg/dL — ABNORMAL HIGH (ref 0.50–1.10)
GFR calc Af Amer: 54 mL/min — ABNORMAL LOW (ref >90)
GFR calc non Af Amer: 47 mL/min — ABNORMAL LOW (ref >90)
Glucose, Bld: 167 mg/dL — ABNORMAL HIGH (ref 70–99)
Potassium: 3.8 mEq/L (ref 3.5–5.1)
Sodium: 136 mEq/L (ref 135–145)
Total Bilirubin: 0.9 mg/dL (ref 0.3–1.2)
Total Protein: 6.5 g/dL (ref 6.0–8.3)

## 2013-01-17 LAB — GLUCOSE, CAPILLARY: Glucose-Capillary: 110 mg/dL — ABNORMAL HIGH (ref 70–99)

## 2013-01-17 LAB — CBC
HCT: 29.1 % — ABNORMAL LOW (ref 36.0–46.0)
Hemoglobin: 9 g/dL — ABNORMAL LOW (ref 12.0–15.0)
MCH: 28.1 pg (ref 26.0–34.0)
MCHC: 30.9 g/dL (ref 30.0–36.0)
MCV: 90.9 fL (ref 78.0–100.0)
Platelets: 380 10*3/uL (ref 150–400)
RBC: 3.2 MIL/uL — ABNORMAL LOW (ref 3.87–5.11)
RDW: 16.4 % — ABNORMAL HIGH (ref 11.5–15.5)
WBC: 12.3 10*3/uL — ABNORMAL HIGH (ref 4.0–10.5)

## 2013-01-17 LAB — PROTIME-INR: Prothrombin Time: 20.9 seconds — ABNORMAL HIGH (ref 11.6–15.2)

## 2013-01-17 LAB — CHROMOGENIC FACTOR X (DUKE LAB): CHROM XA: 67.2 % — ABNORMAL LOW (ref 86–146)

## 2013-01-17 LAB — LACTATE DEHYDROGENASE: LDH: 370 U/L — ABNORMAL HIGH (ref 94–250)

## 2013-01-17 MED ORDER — WARFARIN SODIUM 5 MG PO TABS
5.0000 mg | ORAL_TABLET | Freq: Once | ORAL | Status: AC
Start: 2013-01-17 — End: 2013-01-17
  Administered 2013-01-17: 5 mg via ORAL
  Filled 2013-01-17: qty 1

## 2013-01-17 MED ORDER — ENOXAPARIN SODIUM 40 MG/0.4ML ~~LOC~~ SOLN
40.0000 mg | Freq: Two times a day (BID) | SUBCUTANEOUS | Status: AC
  Administered 2013-01-17 (×2): 40 mg via SUBCUTANEOUS
  Filled 2013-01-17 (×2): qty 0.4

## 2013-01-17 NOTE — Progress Notes (Signed)
ANTICOAGULATION CONSULT NOTE - Follow Up Consult  Pharmacy Consult for Coumadin Indication: LVAD  No Known Allergies  Patient Measurements: Height: 5\' 9"  (175.3 cm) Weight: 163 lb 12.8 oz (74.3 kg) IBW/kg (Calculated) : 66.2  Vital Signs: Temp: 97.7 F (36.5 C) (09/17 0449) Temp src: Oral (09/17 0449) Pulse Rate: 87 (09/17 0449)  Labs:  Recent Labs  01/15/13 0510 01/16/13 0505 01/17/13 0500  HGB 8.7*  --   --   HCT 28.1*  --   --   PLT 420*  --   --   LABPROT 21.2* 21.1* 20.9*  INR 1.90* 1.89* 1.86*  CREATININE 1.09  --   --     Estimated Creatinine Clearance: 53.8 ml/min (by C-G formula based on Cr of 1.09).  Assessment: 67yof s/p LVAD and tricuspid valve repair on 8/18. Coumadin dosing changed to per pharmacy on 9/5.  Anticoagulation for clot prevention. No bleeding issues noted and cbc has remained low but stable.   INR 1.86 today which is unchanged from yesterday despite giving a boosted dose of 4mg .  She has been sensitive to small dose changes. Will give a little bit higher dose today and start lovenox.  Goal of Therapy:  INR 2-2.5 Monitor platelets by anticoagulation protocol: Yes   Plan:  1) Coumadin 5mg  x 1 tonight 2) Lovenox 40mg  sq bid 3) INR in AM 4) Plan discussed with Dr. Hetty Blend, PharmD, BCPS [phone] 01/17/2013 9:47 AM

## 2013-01-17 NOTE — Progress Notes (Signed)
. HeartMate 2 Rounding Note  Subjective:   Postop day #30 HeartMate 2 implantation with subsequent emergency centri- mag RVAD because of  RV dysfunction. Patient  had  stable hemodynamics on BiVAD support fo sven days. .Returned to OR 8-25 for successful removal of RVAD .  Had successful delayed sternal closure  8-28-014  Patient continues to have stable hemodynamics after chest closure- all inotropes weaned off-- Dobutamine has been weaned off- pump parameters are satsifactory on current speed of 8600 rpm Final 2-D echo with a ramp study is planned for today to set optimal speed prior to discharge  Patient now in step down and had uneventful night. No PI events. Mean arterial blood pressure and heart rhythm stable. No fever. Discharge planning almost complete  INR is  Low < therapeutic at 1.8.- coumadin 5mg  tonite and will bridge with Lovenox 40 mg every 12  Patient appears stronger and more responsive today and eager to go home later in week- walked > 200 feet   A postop, post RVAD removal, postextubation 2-D echocardiogram  on 9-2 shows significant improvement in RV function and decrease in tricuspid regurgitation. Repeat 2-D echo  at higher pump speed 9600 RPM indicates good midline position of the interventricular septum, minimal opening of the aortic valve, persistent improvement of the RV    Patient being followed by nutritionist for low albumin, on supplemental protein feedings. Tolerating a dysphagia 3 diet.  UGI bleed from M-W tear- now inactive, on protonix  On coumadin , ASA with goal INR 2.0-2.5 LDH levels are being followed serially and are satisfactory- 397 last LDH   diuresis for fluid overload- on oral spironolactone  WBC >30k> 25>20>18 > 14k  - all antibiotics have been stopped . Old chest tube sites (3) being packed with 2 x 2 gauze wet-to-dry. RVAD cannulation sites are healing. Sternal incision is healing.Drive line site is healing- family instructed on driveline  dressing change   LVAD INTERROGATION:  HeartMate II LVAD:  Flow 3.5liters/min, speed 8600 power 4.3, PI 5.1.   Objective:    Vital Signs:   Temp:  [97.7 F (36.5 C)-98.7 F (37.1 C)] 97.7 F (36.5 C) (09/17 0449) Pulse Rate:  [86-95] 95 (09/17 1040) Resp:  [18] 18 (09/17 0449) BP: (85-92)/(0) 85/0 mmHg (09/17 1040) SpO2:  [93 %] 93 % (09/17 0449) Weight:  [163 lb 12.8 oz (74.3 kg)] 163 lb 12.8 oz (74.3 kg) (09/17 0446) Last BM Date: 01/16/13 Mean arterial Pressure 84  Intake/Output:   Intake/Output Summary (Last 24 hours) at 01/17/13 1353 Last data filed at 01/17/13 1324  Gross per 24 hour  Intake     60 ml  Output      0 ml  Net     60 ml     Physical Exam: General: extubated, responsive, comfortable HEENT: normal Neck: supple. JVP normal  No lymphadenopathy or thryomegaly appreciated. Cor: Mechanical heart sounds with LVAD hum present. Lungs: clear Abdomen: soft, nontender, nondistended. No hepatosplenomegaly. No bruits or masses. Good bowel sounds. Extremities: no cyanosis, clubbing, rash,  Sig edema remains  extremities warm Neuro: moves all 4 extremities  Telemetry: sinus 94/min  Labs: Basic Metabolic Panel:  Recent Labs Lab 01/11/13 0525 01/12/13 0545 01/13/13 0555 01/15/13 0510 01/17/13 1000  NA 137 137 139 139 136  K 3.3* 3.7 3.9 3.8 3.8  CL 97 98 98 99 97  CO2 34* 30 31 31 28   GLUCOSE 103* 107* 103* 96 167*  BUN 22 20 21 22  20  CREATININE 1.03 1.04 1.07 1.09 1.19*  CALCIUM 9.3 9.4 9.6 9.8 9.8    Liver Function Tests: pending in am CBC:  Recent Labs Lab 01/11/13 0525 01/12/13 0545 01/13/13 0555 01/15/13 0510 01/17/13 1000  WBC 14.5* 14.2* 13.5* 14.0* 12.3*  HGB 8.2* 8.3* 8.4* 8.7* 9.0*  HCT 26.0* 26.2* 27.5* 28.1* 29.1*  MCV 91.5 91.6 92.0 92.1 90.9  PLT 410* 420* 403* 420* 380    INR:  Recent Labs Lab 01/13/13 0555 01/14/13 1000 01/15/13 0510 01/16/13 0505 01/17/13 0500  INR 2.67* 2.15* 1.90* 1.89* 1.86*    Other  results:  EKG:   Imaging: No results found.   Medications:     Scheduled Medications: . amiodarone  200 mg Per NG tube Daily  . amLODipine  5 mg Oral Daily  . antiseptic oral rinse  15 mL Mouth Rinse BID  . aspirin EC  325 mg Oral Daily  . citalopram  20 mg Oral Daily  . digoxin  0.0625 mg Oral Daily  . enoxaparin (LOVENOX) injection  40 mg Subcutaneous Q12H  . ferrous fumarate-b12-vitamic C-folic acid  1 capsule Oral TID PC  . influenza vac split quadrivalent PF  0.5 mL Intramuscular Tomorrow-1000  . insulin aspart  0-24 Units Subcutaneous TID AC & HS  . pantoprazole  40 mg Oral BID  . sildenafil  40 mg Per Tube TID  . sodium chloride  10 mL Intravenous Q12H  . spironolactone  25 mg Oral Daily  . torsemide  20 mg Oral QODAY  . warfarin  5 mg Oral ONCE-1800  . Warfarin - Pharmacist Dosing Inpatient   Does not apply q1800    Infusions: . sodium chloride    . 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- now removed after 1 week support 2 coagulopathy and bleeding requiring transfusion now improved 3 UGI bleeding- M-W tear, treated 4 Chest  Closure successful 5 Deconditioning-  Walking 200 ft with PT Plan/Discussion:    Plan Needs pre discharge speed-ramp echo  Increase Coumadin dose to achieve INR greater than 2.0 We'll remove triple-lumen catheter at time of discharge Patient will need home INR determination done Monday, September 22 with results called to VAD coordinator  LVAD education for the patient and family Discharge planning to home with home nurse, home PT   I reviewed the LVAD parameters from today, and compared the results to the patient's prior recorded data.  No programming  changes were made.  The LVAD is functioning within specified parameters.  The patient performs LVAD self-test daily.  LVAD interrogation was negative for any significant power changes, alarms or PI events/speed drops.  LVAD equipment check completed and is in good working order.  Back-up equipment present.   LVAD education done on emergency procedures and precautions and reviewed exit site care.  Length of Stay: 40  [name] III,PETER 01/17/2013, 1:53 PM

## 2013-01-17 NOTE — CV Procedure (Signed)
Ramp Echo  Speed   LVIDD        AI        AOV             MR       TR                                  Septum                                                                          RV 8200      4.9 cm        None   No opening  Mild        Moderate-severe            Paradoxical motion basal, shifted right more apically   Enlarged severely 8600      4.9 cm        None   No opening  Mild        Moderate                        Paradoxical motion basal, midline more apically          Enlarged severely 8800      5.1 cm        None   No opening  Mild        Moderate                        Paradoxical motion basal, mild left shift more apically  Enlarged severely 9000      5.0 cm        None   No opening  Mild        Moderate                        Paradoxical motion basal, left shift more apically          Enlarged severely  Ramp ECHO performed at bedside. Patient's set speed 8600 rpm and back-up speed 8000 rpm  Flow 3.8, PI 6.4, Power 4.1  No PI events  Of note, there is residual at least moderate TR with mean tricuspid gradient 4 mmHg.  The RV is severely enlarged and probably moderately dysfunctional.   Kaitlin [name] 01/17/2013 1:49 PM .

## 2013-01-17 NOTE — Progress Notes (Signed)
CARDIAC REHAB PHASE I   PRE:  Rate/Rhythm: 97 first deg    BP: sitting 82    SaO2: 93 RA  MODE:  Ambulation: 450 ft   POST:  Rate/Rhythm: 115 first deg    BP: sitting 90     SaO2: 94-95 RA  Tolerated well. Moved to batteries quickly. Sat x1 after first lap then completed 2 more laps without sitting. Continues to be SOB, which frustrates her sometimes. Return to EOB. Discussed ex gl with pt as well as CRPII. Will send referral to [place]. Pt knows low sodium diet. [phone]   Kaitlin [name] CES, ACSM 01/17/2013 3:14 PM

## 2013-01-17 NOTE — Progress Notes (Signed)
OT Cancellation Note  Patient Details Name: Kaitlin [name] MRN: [id number] DOB: [birth year]   Cancelled Treatment:    Reason Eval/Treat Not Completed: Fatigue/lethargy limiting ability to participate - pt reports she just finished walking with cardiac rehab and just returned to bed  Jeani Hawking M [phone] 01/17/2013, 3:52 PM

## 2013-01-17 NOTE — Progress Notes (Signed)
01/17/2013 4:23 PM Nursing note Pt. Daughter Kaitlin [name] successfully completed LVAD dressing change according to protocol and followed sterile technique. Questions and concerns addressed.  Jamilynn Whitacre, Blanchard Kelch

## 2013-01-17 NOTE — Progress Notes (Signed)
  Echocardiogram 2D Echocardiogram has been performed.  Kaitlin [name] 01/17/2013, 2:10 PM

## 2013-01-17 NOTE — Progress Notes (Signed)
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    PT Comments   Pt making good progress.  Able to negotiate stairs without difficulty.  Performed self test independently.  Changed from power cord to batteries and back to power cord modified independent.  Follow Up Recommendations  Home health PT;Supervision/Assistance - 24 hour     Does the patient have the potential to tolerate intense rehabilitation     Barriers to Discharge        Equipment Recommendations  Other (comment) (rollator)    Recommendations for Other Services    Frequency Min 5X/week   Progress towards PT Goals Progress towards PT goals: Progressing toward goals  Plan Current plan remains appropriate    Precautions / Restrictions Precautions Precautions: Sternal;Fall Precaution Comments: LVAD Required Braces or Orthoses: Other Brace/Splint (other LVAD equipment) Restrictions Weight Bearing Restrictions: No   Pertinent Vitals/Pain No c/o's pain.    Mobility  Bed Mobility Supine to Sit: 6: Modified independent (Device/Increase time);HOB elevated Sitting - Scoot to Edge of Bed: 6: Modified independent (Device/Increase time) Transfers Sit to Stand: 6: Modified independent (Device/Increase time);Without  upper extremity assist;From bed Stand to Sit: 6: Modified independent (Device/Increase time);Without upper extremity assist;To chair/3-in-1 Details for Transfer Assistance: Maintaining sternal precautions during transfer. Ambulation/Gait Ambulation/Gait Assistance: 5: Supervision Ambulation Distance (Feet): 300 Feet Assistive device: 4-wheeled walker Ambulation/Gait Assistance Details: Pt took 1 sitting rest break.  Able to lock/unlock brakes of rollator without cuing. Gait Pattern: Step-through pattern;Decreased stride length;Narrow base of support Gait velocity: decreased Stairs: Yes Stairs Assistance: 5: Supervision Stairs Assistance Details (indicate cue type and reason): supervision for safety Stair Management Technique: Step to pattern;Forwards;Other (comment) (used counter for simulated rail.) Number of Stairs: 3    Exercises     PT Diagnosis:    PT Problem List:   PT Treatment Interventions:     PT Goals (current goals can now be found in the care plan section)    Visit Information  Last PT Received On: 01/17/13 Assistance Needed: +1 History of Present Illness: Kaitlin [name] is a 67 y/o woman with h/o severe HTN, [name], 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     Subjective Data      Cognition  Cognition Arousal/Alertness: Awake/alert Behavior During Therapy: [facility] for tasks assessed/performed Overall Cognitive Status: Within Functional Limits for tasks assessed    Balance  Static Standing Balance Static Standing - Balance Support: No upper  extremity supported Static Standing - Level of  Assistance: 5: Stand by assistance  End of Session PT - End of Session Equipment Utilized During Treatment: Other (comment) (LVAD equipment) Activity Tolerance: Patient tolerated treatment well Patient left: in chair;with call bell/phone within reach   GP     [facility] 01/17/2013, 9:43 AM  [facility] PT [phone]

## 2013-01-17 NOTE — Progress Notes (Signed)
Patient ID: Kaitlin [name], female   DOB: 1946-01-20, 67 y.o.   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 EF 15-25% with mild to moderate RV dysfunction  8/18 Underwent HM II LVAD implant (under destination criteria) along with TV repair.  Taken back to the OR for RVAD support.  8/25 was taken back to OR for RVAD explant. 8/26 EGD which showed MW tear in esophagus. Injected and clipped. No further bleeding.  8/27  Back to OR for chest closure. 8/31 extubated  Walked halls yesterday, felt good with no dyspnea.     LVAD INTERROGATION:   Not done this morning, she was doing self test.   Objective:    Vital Signs:   Temp:  [97.7 F (36.5 C)-98.7 F (37.1 C)] 97.7 F (36.5 C) (09/17 0449) Pulse Rate:  [84-92] 87 (09/17 0449) Resp:  [18] 18 (09/17 0449) BP: (88-92)/(0) 92/0 mmHg (09/16 1600) SpO2:  [93 %] 93 % (09/17 0449) Weight:  [163 lb 12.8 oz (74.3 kg)] 163 lb 12.8 oz (74.3 kg) (09/17 0446) Last BM Date: 01/16/13 Mean arterial Pressure 80s  Intake/Output:   Intake/Output Summary (Last 24 hours) at 01/17/13 0921 Last data filed at 01/16/13 1352  Gross per 24 hour  Intake    120 ml  Output    751 ml  Net   -631 ml     Physical Exam: MAP 80s-90s General: lying in bed family at bedside HEENT: normal Neck: supple. JVP 8 Carotids 2+ bilat; no bruits. No lymphadenopathy or thryomegaly appreciated.  Cor: LVAD hum present. Lungs: CTA Abdomen: soft, nontender, normal BS Driveline: C/D/I; securement device intact  Extremities: no cyanosis, clubbing, rash, tr edema.  Neuro: awake follows commands moves all 4 extremities w/o difficulty. Affect pleasant  Telemetry: NSR 90s  Labs: Basic Metabolic Panel:  Recent Labs Lab 01/11/13 0525 01/12/13 0545 01/13/13 0555 01/15/13 0510  NA 137 137 139 139  K 3.3* 3.7 3.9 3.8  CL 97 98 98 99  CO2 34* 30  31 31   GLUCOSE 103* 107* 103* 96  BUN 22 20 21 22   CREATININE 1.03 1.04 1.07 1.09  CALCIUM 9.3 9.4 9.6 9.8    Liver Function Tests: No results found for this basename: AST, ALT, ALKPHOS, BILITOT, PROT, ALBUMIN,  in the last 168 hours No results found for this basename: LIPASE, AMYLASE,  in the last 168 hours No results found for this basename: AMMONIA,  in the last 168 hours  CBC:  Recent Labs Lab 01/11/13 0525 01/12/13 0545 01/13/13 0555 01/15/13 0510  WBC 14.5* 14.2* 13.5* 14.0*  HGB 8.2* 8.3* 8.4* 8.7*  HCT 26.0* 26.2* 27.5* 28.1*  MCV 91.5 91.6 92.0 92.1  PLT 410* 420* 403* 420*    INR:  Recent Labs Lab 01/13/13 0555 01/14/13 1000 01/15/13 0510 01/16/13 0505 01/17/13 0500  INR 2.67* 2.15* 1.90* 1.89* 1.86*    Imaging: No results found.   Medications:     Scheduled Medications: . amiodarone  200 mg Per NG tube Daily  . amLODipine  5 mg Oral Daily  . antiseptic oral rinse  15 mL Mouth Rinse BID  . aspirin EC  325 mg Oral Daily  . citalopram  20 mg Oral Daily  . digoxin  0.0625 mg Oral Daily  . ferrous fumarate-b12-vitamic C-folic acid  1 capsule Oral TID PC  .  influenza vac split quadrivalent PF  0.5 mL Intramuscular Tomorrow-1000  . insulin aspart  0-24 Units Subcutaneous TID AC & HS  . pantoprazole  40 mg Oral BID  . sildenafil  40 mg Per Tube TID  . sodium chloride  10 mL Intravenous Q12H  . spironolactone  25 mg Oral Daily  . torsemide  20 mg Oral QODAY  . Warfarin - Pharmacist Dosing Inpatient   Does not apply q1800    Infusions: . sodium chloride    . sodium chloride 250 mL (01/08/13 2157)    PRN Medications: acetaminophen (TYLENOL) oral liquid 160 mg/5 mL, ALPRAZolam, ondansetron (ZOFRAN) IV, oxyCODONE-acetaminophen, sodium chloride, sodium chloride, traMADol   Assessment:   1. Acute on chronic systolic HF with biventricular HF EF 10%  --S/p HM II VAD with TV repair 12/18/12  --s/p RVAD. - explanted 8/25 2. 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 Cardiogenic shock  4. VT s/p Biotronik ICD on amio 5. Chronic renal failure, stage III 6. 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 ABL anemia  8. PNA 9. Severe protein calorie malnutrition - on TPN 10. Hyponatremia/Hypokalemia 11. 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 Acute respiratory failure  Plan/Discussion:    Got torsemide yesterday, now on every other day torsemide.  Weight stable.  Awaiting BMET today.  Continue ASA and warfarin, INR 1.8 (target 2-3).   Continue PT. Target discharge on Thursday. Will plan for ramp echo today, aim for around 1 pm.     Freida Busman Demarian Epps,MD  9:21 AM Length of Stay: 40 VAD Team Pager [phone] (7am - 7am) VAD Issue Non-VAD issues HF Pager [phone]

## 2013-01-18 ENCOUNTER — Inpatient Hospital Stay (HOSPITAL_COMMUNITY): Payer: Commercial Managed Care - PPO

## 2013-01-18 LAB — GLUCOSE, CAPILLARY: Glucose-Capillary: 112 mg/dL — ABNORMAL HIGH (ref 70–99)

## 2013-01-18 LAB — LACTATE DEHYDROGENASE: LDH: 371 U/L — ABNORMAL HIGH (ref 94–250)

## 2013-01-18 MED ORDER — SIMVASTATIN 10 MG PO TABS: 10.0000 mg | ORAL_TABLET | Freq: Every day | ORAL | Status: DC

## 2013-01-18 MED ORDER — AMIODARONE HCL 200 MG PO TABS: 200.0000 mg | ORAL_TABLET | Freq: Every day | ORAL | Status: DC

## 2013-01-18 MED ORDER — AMLODIPINE BESYLATE 5 MG PO TABS: 5.0000 mg | ORAL_TABLET | Freq: Every day | ORAL | Status: DC

## 2013-01-18 MED ORDER — TORSEMIDE 20 MG PO TABS: 20.0000 mg | ORAL_TABLET | ORAL | Status: DC

## 2013-01-18 MED ORDER — TRAMADOL HCL 50 MG PO TABS: ORAL_TABLET | ORAL | Status: DC

## 2013-01-18 MED ORDER — SPIRONOLACTONE 25 MG PO TABS: 25.0000 mg | ORAL_TABLET | Freq: Every day | ORAL | Status: DC

## 2013-01-18 MED ORDER — FE FUMARATE-B12-VIT C-FA-IFC PO CAPS: 1.0000 | ORAL_CAPSULE | Freq: Three times a day (TID) | ORAL | Status: DC

## 2013-01-18 MED ORDER — PANTOPRAZOLE SODIUM 40 MG PO TBEC: 40.0000 mg | DELAYED_RELEASE_TABLET | Freq: Every day | ORAL | Status: DC

## 2013-01-18 MED ORDER — WARFARIN SODIUM 5 MG PO TABS: 2.5000 mg | ORAL_TABLET | Freq: Every day | ORAL | Status: DC

## 2013-01-18 MED ORDER — SILDENAFIL CITRATE 20 MG PO TABS: 40.0000 mg | ORAL_TABLET | Freq: Three times a day (TID) | ORAL | Status: DC

## 2013-01-18 MED ORDER — CITALOPRAM HYDROBROMIDE 20 MG PO TABS: 20.0000 mg | ORAL_TABLET | Freq: Every day | ORAL | Status: DC

## 2013-01-18 MED ORDER — DIGOXIN 125 MCG PO TABS: 0.0625 mg | ORAL_TABLET | Freq: Every day | ORAL | Status: DC

## 2013-01-18 MED ORDER — ASPIRIN 325 MG PO TBEC: 325.0000 mg | DELAYED_RELEASE_TABLET | Freq: Every day | ORAL | Status: DC

## 2013-01-18 MED ORDER — INFLUENZA VAC SPLIT QUAD 0.5 ML IM SUSP
0.5000 mL | Freq: Once | INTRAMUSCULAR | Status: AC
  Administered 2013-01-18: 0.5 mL via INTRAMUSCULAR
  Filled 2013-01-18: qty 0.5

## 2013-01-18 NOTE — Progress Notes (Signed)
Discharge VAD teaching completed with patient and two daughters [name] and Trena Platt).    The patient's family has completed a home inspection checklist, this has been reviewed and no unsafe conditions have been identified.  Family has ensured two dedicated grounded, 3-prong outlets with clearly labeled circuit breaker has been established in the bedroom for power module and Magazine features editor.  Both patient and caregiver(s) have been trained on the following:  ____ Overview of major warnings and cautions  ____ HM II LVAD overview of system operation  ____    Overview of system components (features and functions) ____ Changing power source ____ Overview of alerts and alarms ____ How to identify an emergency ____ How to handle an emergency including when the pump is running and when the pump has stopped  ____ Changing system controller ____ Maintain emergency contact list  The patient and caregiver(s) have successfully demonstrated:  ____ Changing power source (from batteries to power module, power module to  batteries, and replacing batteries) ____ Perform system controller self test  ____ Perform power module self test  ____ Check and charge batteries  ____ Change system controller  A daily flow sheet with patient  weight, temperature,  flow, speed, power, and PI, along with daily self checks on system controller and power module have been performed by patient and caregiver(s) during hospitalization and will also be done daily at home.   The caregiver(s) has been trained on percutaneous lead exit site care and dressing changes and has performed dressing changes during patient's hospitalization under nursing supervision. The importance of lead immobilization has been stressed to patient and caregiver(s) using the attachment device. The caregiver has successfully demonstrated the following: ____ Cleansing ____ Dressing ____ Immobilizing lead  The following routine activities  and maintenance have been reviewed with patient and caregiver(s) and both verbalize understanding:  ____ Stressed importance of never disconnecting power from both controller power leads at the same time, and never disconnecting both batteries at the same time, or the pump will stop ____ Plug the power module (PM) or the universal Magazine features editor (UBC) into properly grounded (3 prong) outlets dedicated to PM use. Do NOT use adapter (cheater plug) for ungrounded outlets or multiple portable socket outlets (power strips) ____ Do not connect the PM or UBC to an outlet controlled by wall switch or the device may not work ____ The PM's internal battery (that provides limited backup power to the LVAD in the event of AC mains power failure) remains charged as long as the PM is connected to [facility] or DC power ____ The PM's internal battery provides approximately 30 minutes of emergency backup power for the LVAD ____ Transfer from PM to batteries during [facility] mains power failure. The PM has internal backup battery that will power the pump while you transfer to batteries ____ Keep a backup system controller, charged batteries, battery clips, and             flashlight near you during sleep in case of electrical power outage ____ Clean battery, battery clip, and universal battery charger contacts weekly ____ Visually inspect percutaneous lead daily ____ Check cables and connectors when changing power source  ____    Rotate batteries; keep all eight batteries charged ____ Always have backup system controller, battery clips, fully charged batteries, and spare fully charged batteries when traveling ____ Re-calibrate batteries every 70 uses; monitor battery life of 36 months or 360 uses; replace batteries at end of battery life   Identified  the following changes in activities of daily living with pump:  ____ No driving for at least six weeks and then only if doctor gives    permission to do so ____ No tub baths  while pump implanted, and shower only if doctor gives    permission ____    No swimming or submersion in water while implanted with pump ____    Keep all VAD equipment away from water or moisture ____ Keep all VAD connections clean and dry ____ No contact sports or engage in jumping activities ____ Avoid strong static electricity (touching TV/computer screens, vacuuming) ____ Never have an MRI while implanted with the pump ____ Never leave or store batteries in extremely hot or cold places (such as   trunk of your car), or the battery life will be shortened ____ Call the doctor or hospital contact person if any change in how the pump            sounds, feels, or works ____ Plan to sleep only when connected to the power module. ____    Keep a backup system controller, charged batteries, battery clips, and             flashlight near you during sleep in case of electrical power outage ____ Do not sleep on your stomach ____ Talk with doctor before any long distance travel plans ____ Patient will need antibiotics prior to any dental procedure; instructed to contact VAD coordinator before any dental procedures (including routine cleaning)    Discharge binder given to patient and include the following:  ____ Discharge instructions sheet ____ List of emergency contacts ____ Barry Brunner card ____    HM II Luggage tags ____ Guide to Percutaneous Lead Care and Equipment Maintenance ____ HM II Alarms for Patients and their Caregivers ____ HM II Patient Handbook ____ HM II Patient Education Program DVD ____ HM II Information and Emergency Assistance Guide ____ HM II Power Module Alarms, Care, Maintenance ____ Daily diary sheets ____    [place] VAD Patient Education Booklet ____    Care of the Percutaneous Lead ____    HM II LVAS Emergency Response Checklist   Discharge equipment includes:  ____ Two system controllers with preset: Fixed speed of  8600  RPM Low speed limit   8000   RPM ____ One power module (PM) with 20' patient cable ____ One universal Magazine features editor (UBC) ____ Eight fully charged batteries ____ Four battery clips ____ One travel case ____ One holster vest ____    One consolidated bag ____    One shower kit ____    30 daily dressing kits  Following notification process completed with: _____   [facility] _____   [employer]   Discussed frequency and importance of INR checks; emphasized importance of maintaining INR goal to prevent clotting and or bleeding issues with pump.  Patient will have INR managed by heart failure team utilizing [facility] and clinic f/u labs; current INR goal is 2 - 2.5.  Based on information received from [facility] II manufacturer ([employer]) about reported difficulties that have occurred with some patients and caregivers when changing from primary to back up pocket controller, the HM II LVAD addendum document 191478 was given and reviewed with pt and caregivers.  Additional instructions were provided as follows:        1. Patient should have a caregiver present during system controller exchange or call     911 and VAD pager before attempting to change  controller if alone. 2. As part of the weekly safety checklist, the patient and caregiver should review and      understand the steps and instructions involved with replacing the system controller.  3. As part of monthly safety checklist, the patient and caregiver should review and                     understand the system controller alarms and how to resolve them.  4. Updated copy of HM II Guide to Replacing the Running System Controller with the              Backup Controller for Patients and Their Caregiveriven reviewed and given to the                 patient.  5. HM II Home Flowsheets given that include the weekly and monthly checks as      noted above.  The patient has completed a proficiency test for the HM II and all questions have been answered. The pt and  family have been instructed to call if any questions, problems, or concerns arise. Pt and caregivers successfully paged VAD coordinator using VAD pager emergency number and have been instructed to use this number only for emergencies. Patient and caregiver(s) asked appropriate questions, had good interaction with VAD coordinator, and verbalized understanding of above instructions.

## 2013-01-18 NOTE — Progress Notes (Signed)
Pt reviewed discharge instructions, medication, and prescriptions given to daughter. Pt verbalized understanding. All LVAD equipment given. Encouraged pt to call with any needs.

## 2013-01-18 NOTE — Discharge Summary (Signed)
Advanced Heart Failure Team  Discharge Summary   Patient ID: Kaitlin [name] MRN: [id number], DOB/AGE: February 11, 1946 67 y.o. 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 Underwent outpatient RHC on 12/08/12 due to low output HF symptoms and worsening renal function.  RHC showed cardiogenic shock physiology with biventricular failure and Kaitlin [name] was admitted to ICU and started on inotropic support. Kaitlin [name] remained very tenuous despite inotropic support due to biventricular HF.  Kaitlin [name] case was discussed with Dr. Jory Ee at the Transplant Clinic at [facility] and the decision was made to proceed with LVAD placement with an eye to cardiac transplant at a later time.   Kaitlin [name] underwent full work-up for VAD placement and was felt to be a good VAD candidate with the understanding that Kaitlin [name] would need a TV ring and that Kaitlin [name] may have trouble with RV failure int he post-op period. Kaitlin [name] case was presented to the Medical Review Board and the decision was made to proceed with LVAD placement and TV repair. On 12/18/12 underwent LVAD implantation (HeartMate II) and tricuspid valve repair by Dr. Donata Clay. On the night of Kaitlin [name] surgery Kaitlin [name] developed worsening RV failure and had to be taken  back to the OR for emergent RVAD support. Kaitlin [name] also struggled with post-operative bleeding and received multiple PRBCs and clotting factors. Kaitlin [name] remained on nitric oxide and multiple pressors post-operatively. After an appropriate wean, Kaitlin [name] RVAD was explanted on 12/25/12 and then on 12/27/12 Kaitlin [name] was taken back to the OR for chest closure. Kaitlin [name] remained on the vent until 12/31/12 when Kaitlin [name] was successfully extubated. Gastroenterology was consulted due to bleeding through NG tube. Kaitlin [name] was found to have a Mallory Weiss tear and Kaitlin [name] ASA and anti-coagulation was stopped temporarily.   Kaitlin [name] stay in the ICU was complicated by multiple other issues. Kaitlin [name] had episodes of AF with RVR and VT and was treated with IV amiodarone which was transitioned back to po amio. Kaitlin [name] had  low grade fevers with persistent severe leukocytosis (30K). Bcx remianed negative but sputum cx grew heavy yeast. Kaitlin [name] was treated with broad spectrum abx and fluconazole.   After Kaitlin [name] RVAD removal Kaitlin [name] INR remained subtherapeutic and while Kaitlin [name] was being bridged with heparin it was felt that Kaitlin [name] was heparin resistant and was transitioned to angiomax. Once Kaitlin [name] INR was therapeutic Kaitlin [name] angiomax was discontinued. Kaitlin [name] nutrition status was compromised and Kaitlin [name] was started on enteral nutrition which was continued until Kaitlin [name] was able to tolerate adequate intake. Kaitlin [name] inotropes and nitric oxide were eventually weaned and Kaitlin [name] was treated with sildenafil for RV support. Kaitlin [name] worked with PT/OT extensively and was transferred to 2000 on 01/08/13.  Kaitlin [name] had  massive post-op volume overload (> 40 pounds) and Kaitlin [name] was diuresed IV lasix and then oral torsemide. Kaitlin [name] diuresed down to 160 pounds and was transitioned to torsemide 20 mg every other day. Kaitlin [name] MAPs were slightly elevated and amlodipine was added. Following this Kaitlin [name] MAP remained stable in the 70-80s. PT along with OT worked with Kaitlin [name] aggressively and on discharge today Kaitlin [name] was able to walk up stairs and ambulate up to 500 ft with no  SOB.   RAMP ECHO 01/17/13 and speed was left at 8600, RV enlarged severely.  Kaitlin [name] and Kaitlin [name] daughters met with the VAD Coordinator multiple times for education. They completed the competency exam and felt comfortable with being discharged home with [facility] and PT. Kaitlin [name] will be followed closely in the HF clinic.  Given amiodarone therapy Kaitlin [name] simvastatin was cut to 10mg  daily at d/c.   LVAD Interrogation HM II:   Speed: 8600  Flow: ---  PI: 6.7  Power: 4.2  Back-up speed: 8000     Discharge Weight Range: 160-163 lbs  Discharge Vitals: Blood pressure 85/0, pulse 81, temperature 98.6 F (37 C), temperature source Oral, resp. rate 18, height 5\' 9"  (1.753 m), weight 162 lb 11.2 oz (73.8 kg), SpO2 94.00%.  Labs: Lab Results  Component Value Date   WBC 12.3* 01/17/2013   HGB 9.0* 01/17/2013   HCT 29.1* 01/17/2013   MCV 90.9 01/17/2013   PLT 380 01/17/2013     Recent Labs Lab 01/17/13 1000  NA 136  K 3.8  CL 97  CO2 28  BUN 20  CREATININE 1.19*  CALCIUM 9.8  PROT 6.5  BILITOT 0.9  ALKPHOS 147*  ALT 29  AST 22  GLUCOSE 167*   Lab Results  Component Value Date   CHOL 153 04/14/2010   HDL 44.60 04/14/2010   LDLCALC 92 04/14/2010   TRIG 159* 12/25/2012   BNP (last 3 results)  Recent Labs  06/19/12 0800 12/19/12 0314  PROBNP 16784.0* 1470.0*    Diagnostic Studies/Procedures   Dg Chest 2 View  01/18/2013   *RADIOLOGY REPORT*  Clinical Data: Left ventricular assist device  CHEST - 2 VIEW  Comparison: 01/12/2013  Findings: Callback remain in place.  Left-sided AICD again noted. Heart size remains mildly enlarged with mild central vascular congestion but no overt edema.  Evidence of valvuloplasty and median sternotomy.  Trace bilateral pleural fluid or thickening is decreased.  Aeration is improved.  IMPRESSION: Improved aeration bilaterally with stable trace pleural effusions.   Original Report Authenticated By: Christiana Pellant, M.D.    Discharge Medications     Medication  List    STOP taking these medications       carvedilol 25 MG tablet  Commonly known as:  COREG     furosemide 40 MG tablet  Commonly known as:  LASIX     lisinopril 10 MG tablet  Commonly known as:  PRINIVIL,ZESTRIL      TAKE these medications       acetaminophen 325 MG tablet  Commonly known as:  TYLENOL  Take 650 mg by mouth every 6 (six) hours as needed for pain.     amiodarone 200 MG tablet  Commonly known as:  PACERONE  Take 1 tablet (200 mg total) by mouth daily.     amLODipine 5 MG tablet  Commonly known as:  NORVASC  Take 1 tablet (5 mg total) by mouth daily.     aspirin 325 MG EC tablet  Take 1 tablet (325 mg total)  by mouth daily.     B-12 PO  Take 1 tablet by mouth daily.     citalopram 20 MG tablet  Commonly known as:  CELEXA  Take 1 tablet (20 mg total) by mouth daily.     digoxin 0.125 MG tablet  Commonly known as:  LANOXIN  Take 0.5 tablets (0.0625 mg total) by mouth daily.     ferrous fumarate-b12-vitamic C-folic acid capsule  Commonly known as:  TRINSICON / FOLTRIN  Take 1 capsule by mouth 3 (three) times daily after meals.     pantoprazole 40 MG tablet  Commonly known as:  PROTONIX  Take 1 tablet (40 mg total) by mouth daily.     sildenafil 20 MG tablet  Commonly known as:  REVATIO  Take 2 tablets (40 mg total) by mouth 3 (three) times daily.     simvastatin 10 MG tablet  Commonly known as:  ZOCOR  Take 1 tablet (10 mg total) by mouth at bedtime.     spironolactone 25 MG tablet  Commonly known as:  ALDACTONE  Take 1 tablet (25 mg total) by mouth daily.     torsemide 20 MG tablet  Commonly known as:  DEMADEX  Take 1 tablet (20 mg total) by mouth every other day.  Start taking on:  01/20/2013     traMADol 50 MG tablet  Commonly known as:  ULTRAM  Take 50-100 mg (1-2 tablets) every 6 hrs PRN for pain     warfarin 5 MG tablet  Commonly known as:  COUMADIN  Take 0.5 tablets (2.5 mg total) by mouth daily.        Disposition    The patient will be discharged in stable condition to home. Discharge Orders   Future Appointments Provider Department Dept Phone   01/24/2013 2:00 PM [facility] [phone]   02/01/2013 11:00 AM Marinus Maw, MD [facility] Main [facility] [place]) [phone]   Future Orders Complete By Expires   Amb Referral to Cardiac Rehabilitation  As directed    Contraindication to ACEI at discharge  As directed    Scheduling Instructions:     Dr. Gala Romney wants to start on outpatient Kaitlin [name] was having CRI   Diet - low sodium heart healthy  As directed    Heart Failure patients record your daily weight using the same scale at the same time of day  As directed    Increase activity slowly  As directed    STOP any activity that causes chest pain, shortness of breath, dizziness, sweating, or exessive weakness  As directed      Follow-up Information   Follow up with Arvilla Meres, MD On 12/19/2012. (at 2:00)    Specialty:  Cardiology   Contact information:   [address] [place] [address] [phone]         Duration of Discharge Encounter: Greater than 35 minutes   Signed, Aundria Rud  01/18/2013, 2:08 PM  Patient seen and examined with Ulla Potash, NP. We discussed all aspects of the encounter. I agree with the assessment and plan as stated above. I have edited the note above to reflect my changes.Ok for d/c today. All questions answered.   Daniel Bensimhon,MD 5:29 PM

## 2013-01-18 NOTE — Progress Notes (Signed)
Removed rt [place] central line. Pressure applied for 10 mins. Pt tolerated removal well. Instructed pt that dressing can be removed tomorrow evening.

## 2013-01-18 NOTE — Progress Notes (Addendum)
Patient ID: Kaitlin [name], female   DOB: 30-Dec-1945, 67 y.o.   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 EF 15-25% with mild to moderate RV dysfunction  8/18 Underwent HM II LVAD implant (under destination criteria) along with TV repair.  Taken back to the OR for RVAD support.  8/25 was taken back to OR for RVAD explant. 8/26 EGD which showed MW tear in esophagus. Injected and clipped. No further bleeding.  8/27  Back to OR for chest closure. 8/31 extubated  RAMP ECHO yesterday and speed left at 8600. Denies SOB, orthopnea or CP. Ambulating in the halls.   LVAD INTERROGATION:   Flow: - - -; Speed: 8600, Power: 4.2, PI: 6.7 NO PI events  Objective:    Vital Signs:   Temp:  [98.1 F (36.7 C)-98.8 F (37.1 C)] 98.5 F (36.9 C) (09/18 0400) Pulse Rate:  [82-95] 82 (09/18 0400) Resp:  [18] 18 (09/18 0400) BP: (85)/(0) 85/0 mmHg (09/17 1627) SpO2:  [93 %-94 %] 94 % (09/18 0400) Weight:  [162 lb 11.2 oz (73.8 kg)] 162 lb 11.2 oz (73.8 kg) (09/18 0614) Last BM Date: 01/17/13 Mean arterial Pressure 80s  Intake/Output:   Intake/Output Summary (Last 24 hours) at 01/18/13 0951 Last data filed at 01/18/13 0753  Gross per 24 hour  Intake    300 ml  Output      0 ml  Net    300 ml     Physical Exam: MAP 80s-90s General: sitting in chair HEENT: normal Neck: supple. JVP 8-9  Carotids 2+ bilat; no bruits. No lymphadenopathy or thryomegaly appreciated.  Cor: LVAD hum present. Lungs: CTA Abdomen: soft, nontender, normal BS Driveline: C/D/I; securement device intact  Extremities: no cyanosis, clubbing, rash, no edema.  Neuro: awake follows commands moves all 4 extremities w/o difficulty. Affect pleasant  Telemetry: NSR 80-90s  Labs: Basic Metabolic Panel:  Recent Labs Lab 01/12/13 0545 01/13/13 0555 01/15/13 0510 01/17/13 1000  NA 137 139 139 136   K 3.7 3.9 3.8 3.8  CL 98 98 99 97  CO2 30 31 31 28   GLUCOSE 107* 103* 96 167*  BUN 20 21 22 20   CREATININE 1.04 1.07 1.09 1.19*  CALCIUM 9.4 9.6 9.8 9.8    Liver Function Tests:  Recent Labs Lab 01/17/13 1000  AST 22  ALT 29  ALKPHOS 147*  BILITOT 0.9  PROT 6.5  ALBUMIN 3.3*   No results found for this basename: LIPASE, AMYLASE,  in the last 168 hours No results found for this basename: AMMONIA,  in the last 168 hours  CBC:  Recent Labs Lab 01/12/13 0545 01/13/13 0555 01/15/13 0510 01/17/13 1000  WBC 14.2* 13.5* 14.0* 12.3*  HGB 8.3* 8.4* 8.7* 9.0*  HCT 26.2* 27.5* 28.1* 29.1*  MCV 91.6 92.0 92.1 90.9  PLT 420* 403* 420* 380    INR:  Recent Labs Lab 01/14/13 1000 01/15/13 0510 01/16/13 0505 01/17/13 0500 01/18/13 0615  INR 2.15* 1.90* 1.89* 1.86* 2.24*    Imaging: Dg Chest 2 View  01/18/2013   *RADIOLOGY REPORT*  Clinical Data: Left ventricular assist device  CHEST - 2 VIEW  Comparison: 01/12/2013  Findings: Callback remain in place.  Left-sided AICD again noted. Heart size remains mildly enlarged with mild central vascular congestion but no overt edema.  Evidence of valvuloplasty and median sternotomy.  Trace  bilateral pleural fluid or thickening is decreased.  Aeration is improved.  IMPRESSION: Improved aeration bilaterally with stable trace pleural effusions.   Original Report Authenticated By: Christiana Pellant, M.D.     Medications:     Scheduled Medications: . amiodarone  200 mg Per NG tube Daily  . amLODipine  5 mg Oral Daily  . antiseptic oral rinse  15 mL Mouth Rinse BID  . aspirin EC  325 mg Oral Daily  . citalopram  20 mg Oral Daily  . digoxin  0.0625 mg Oral Daily  . ferrous fumarate-b12-vitamic C-folic acid  1 capsule Oral TID PC  . influenza vac split quadrivalent PF  0.5 mL Intramuscular Tomorrow-1000  . insulin aspart  0-24 Units Subcutaneous TID AC & HS  . pantoprazole  40 mg Oral BID  . sildenafil  40 mg Per Tube TID  . sodium  chloride  10 mL Intravenous Q12H  . spironolactone  25 mg Oral Daily  . torsemide  20 mg Oral QODAY  . Warfarin - Pharmacist Dosing Inpatient   Does not apply q1800    Infusions: . sodium chloride    . sodium chloride 250 mL (01/08/13 2157)    PRN Medications: acetaminophen (TYLENOL) oral liquid 160 mg/5 mL, ALPRAZolam, ondansetron (ZOFRAN) IV, oxyCODONE-acetaminophen, sodium chloride, traMADol   Assessment:   1. Acute on chronic systolic HF with biventricular HF EF 10%  --S/p HM II VAD with TV repair 12/18/12  --s/p RVAD. - explanted 8/25 2. 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 Cardiogenic shock  4. VT s/p Biotronik ICD on amio 5. Chronic renal failure, stage III 6. 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 ABL anemia  8. PNA 9. Severe protein calorie malnutrition - on TPN 10. Hyponatremia/Hypokalemia 11. 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 Acute respiratory failure  Plan/Discussion:    Weight stable. Will continue torsemide 20 mg QOD.   INR at goal 2.2, will continue coumadin 2.5 mg daily and recheck INR Monday.  Home today with very close follow up in HF clinic.    Ulla Potash B,NP-C 9:51 AM Length of Stay: 41 VAD Team Pager [phone] (7am - 7am) VAD Issue Non-VAD issues HF Pager [phone]  Patient seen and examined with Ulla Potash, NP. We discussed all aspects of the encounter. I agree with the assessment and plan as stated above.   She is doing very well and is able to ambulate room by herself. Ramp study reviewed. Labs look great. She was able to switch herself on/off batteries this am independently. She is adamant that she wants to go home today.   I think she is very ready. Only other option would be to send to [facility] for more rehab and she is not interested in that.   Will plan d/c today with close f/u in Clinic. I will discuss with her daughter again this am. D/c meds reviewed and discussed. VAD parameters  look good.   Algie Cales,MD 10:04 AM

## 2013-01-18 NOTE — Progress Notes (Signed)
Physical Therapy Treatment Patient Details Name: Kaitlin [name] MRN: [id number] DOB: 03/12/1946 Today's Date: 01/18/2013 Time: 4098-1191 and  4782-9562 PT Time Calculation (min):39 min (2 sessions)  PT Assessment / Plan / Recommendation  History of Present Illness Kaitlin [name] is a 67 y/o woman with h/o severe HTN, [name], 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    PT Comments   Met with Kaitlin [name] and family for home education and mobility management. Spoke with family and patient at length regarding mobility expectations, level of current function, home activities and expectations upon discharge. Updated family members on techniques for energy conservation and fatigue management. During second session, met with patient again to perform stair negotiation training, ambulation, and goals assessment. Spoke with patient regarding sternal precaution compliance and techniques for safe compliance at home. At this time patient demonstrates mobility at Mod I levels and management of equipment at supervision levels. Education has been complete with patient and family. Feel patient is safe for discharge home with family to assist. Patient and family in agreement. Acute PT will sign off. It has been a pleasure working with Kaitlin [name] throughout her acute recovery.   Follow Up Recommendations  Home health PT;Supervision/Assistance - 24 hour     Does the patient have the potential to tolerate intense rehabilitation     Barriers to Discharge        Equipment Recommendations  Other (comment) (rollator)    Recommendations for Other Services    Frequency Min 5X/week   Progress towards PT Goals Progress towards PT goals: Goals met/education completed, patient discharged from PT  Plan Current plan remains appropriate    Precautions / Restrictions Precautions Precautions: Sternal;Fall Precaution Comments: LVAD Required Braces or Orthoses: Other Brace/Splint (LVAD equipment)   Pertinent Vitals/Pain No pain at this time    Mobility  Bed Mobility Bed Mobility: Not assessed Transfers Transfers: Sit to Stand;Stand to Sit Sit to Stand: 6: Modified independent (Device/Increase time) Stand to Sit: 6: Modified independent (Device/Increase time) Details for Transfer Assistance: increased time to perform, good compliance with precautions Ambulation/Gait Ambulation/Gait Assistance: 6: Modified independent (Device/Increase time) Ambulation Distance (Feet): 300 Feet Assistive device: None Ambulation/Gait Assistance Details: increased time, 2 standing rest breaks one before and one after stairs Gait Pattern: Step-through pattern;Decreased stride length;Narrow base of support Gait velocity: decreased General Gait Details: some instability noted when not using device, ambulated without oxygen today Stairs: Yes Stairs Assistance: 5: Supervision Stair Management Technique: One rail Right;Step to pattern;Forwards (educated on energy conservation\) Number of Stairs: 5    Exercises Other Exercises Other Exercises: Pt verbalized LVAD device management without cues.      PT Goals (current goals can now be found in the care plan section) Acute Rehab PT Goals Patient Stated Goal: get back to work at the [employer] and do the wobble PT Goal Formulation: With patient Time For Goal Achievement: 01/23/13 Potential to Achieve Goals: Good  Visit Information  Last PT Received On: 01/18/13 Assistance Needed: +1 History of Present Illness: Kaitlin [name] is a 67 y/o woman with h/o  severe HTN, [name], VT s/p Biotronik ICD (2009) and CHF  due to NICM. EF 15-25% with mild to moderate RV dysfunction.Underwent HM II LVAD implant (under destination criteria) along with TV repair on 8/18. HeartMate 2 implantation with subsequent emergency centri- mag RVAD because of RV dysfunction. Patient had stable hemodynamics on BiVAD support.Returned to OR 8-25 for successful removal of RVAD Responsive but comfortable with IV sedation. Had successful delayed sternal closure 8-28-014     Subjective Data  Subjective: I feel pretty good, just tried Patient Stated Goal: get back to work at the [employer] and do the [employer] Arousal/Alertness: Awake/alert Behavior During Therapy: WFL for tasks assessed/performed Overall Cognitive Status: Within Functional Limits for tasks assessed    Balance  Balance Balance Assessed: Yes Static Standing Balance Static Standing - Balance Support: Bilateral upper extremity supported;During functional activity Static Standing - Level of Assistance: 6: Modified independent (Device/Increase time) High Level Balance High Level Balance Activites: Side stepping;Backward walking;Direction changes;Turns High Level Balance Comments: improved  End of Session PT - End of Session Activity Tolerance: Patient tolerated treatment well Patient left: in chair;with call bell/phone within reach;with family/visitor present Nurse Communication: Mobility status   GP     Fabio Asa 01/18/2013, 2:29 PM Charlotte Crumb, PT DPT  [phone]

## 2013-01-18 NOTE — Progress Notes (Signed)
Physical Therapy Discharge Patient Details Name: FREDDIE NGHIEM MRN: [id number] DOB: 12-09-1945 Today's Date: 01/18/2013 Time: 8657-8469 PT Time Calculation (min): 23 min  Patient discharged from PT services secondary to goals met and no further PT needs identified.  Please see latest therapy progress note for current level of functioning and progress toward goals.    Progress and discharge plan discussed with patient and/or caregiver: Patient/Caregiver agrees with plan  GP     Fabio Asa 01/18/2013, 2:38 PM

## 2013-01-22 ENCOUNTER — Ambulatory Visit (HOSPITAL_COMMUNITY): Payer: Self-pay | Admitting: *Deleted

## 2013-01-22 LAB — PROTIME-INR: INR: 2.7 — AB (ref 0.9–1.1)

## 2013-01-22 NOTE — Patient Instructions (Addendum)
No change in coumadin dose

## 2013-01-24 ENCOUNTER — Other Ambulatory Visit (HOSPITAL_COMMUNITY): Payer: Self-pay | Admitting: *Deleted

## 2013-01-24 ENCOUNTER — Ambulatory Visit (HOSPITAL_COMMUNITY)
Admit: 2013-01-24 | Discharge: 2013-01-24 | Disposition: A | Discharge status: Home / Self Care | Payer: Commercial Managed Care - PPO | Source: Ambulatory Visit | Attending: Internal Medicine | Admitting: Internal Medicine

## 2013-01-24 VITALS — BP 104/0 | HR 105 | Ht 63.0 in | Wt 159.0 lb

## 2013-01-24 DIAGNOSIS — Z7901 Long term (current) use of anticoagulants: Secondary | ICD-10-CM

## 2013-01-24 DIAGNOSIS — Z95818 Presence of other cardiac implants and grafts: Secondary | ICD-10-CM

## 2013-01-24 DIAGNOSIS — Z95811 Presence of heart assist device: Secondary | ICD-10-CM

## 2013-01-24 DIAGNOSIS — I428 Other cardiomyopathies: Secondary | ICD-10-CM

## 2013-01-24 DIAGNOSIS — I10 Essential (primary) hypertension: Secondary | ICD-10-CM | POA: Insufficient documentation

## 2013-01-24 DIAGNOSIS — I50810 Right heart failure, unspecified: Secondary | ICD-10-CM

## 2013-01-24 DIAGNOSIS — I5022 Chronic systolic (congestive) heart failure: Secondary | ICD-10-CM

## 2013-01-24 DIAGNOSIS — I509 Heart failure, unspecified: Secondary | ICD-10-CM

## 2013-01-24 MED ORDER — AMLODIPINE BESYLATE 5 MG PO TABS: 10.0000 mg | ORAL_TABLET | Freq: Every day | ORAL | Status: DC

## 2013-01-24 NOTE — Patient Instructions (Addendum)
1. Increase Norvasc to 10 mg daily 2. Return to VAD clinic one week

## 2013-01-26 ENCOUNTER — Ambulatory Visit (HOSPITAL_COMMUNITY): Payer: Self-pay | Admitting: *Deleted

## 2013-01-26 ENCOUNTER — Other Ambulatory Visit (HOSPITAL_COMMUNITY): Payer: Self-pay | Admitting: *Deleted

## 2013-01-26 DIAGNOSIS — Z7901 Long term (current) use of anticoagulants: Secondary | ICD-10-CM

## 2013-01-26 DIAGNOSIS — I509 Heart failure, unspecified: Secondary | ICD-10-CM

## 2013-01-26 DIAGNOSIS — Z95811 Presence of heart assist device: Secondary | ICD-10-CM

## 2013-01-26 LAB — PROTIME-INR: INR: 2.9 — AB (ref 0.9–1.1)

## 2013-01-26 NOTE — Patient Instructions (Signed)
No change in coumadin dose per Sheppard Coil, PharmD

## 2013-01-30 DIAGNOSIS — I509 Heart failure, unspecified: Secondary | ICD-10-CM

## 2013-01-30 NOTE — Progress Notes (Signed)
HPI:  Ms. Youngman is a 67 y/o woman with h/o severe HTN, [name], VT s/p Biotronik ICD (2009) and 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 EF 15-25% with mild to moderate RV dysfunction  8/18 Underwent HM II LVAD implant (under destination criteria) along with TV repair.Had c.b post-op couse requiring take back to the OR for bleeding and temproray RVAD support.   Follow up: She is doing absolutely wonderfully. Energy level increasing. Able to do all ADLs without problems. Comfortable with equipment and dressing changes. No significant alarms. No bleeding. Denies edema, orthopnea, PND or fevers.   Denies LVAD alarms.  Denies driveline trauma, erythema or drainage.  Denies ICD shocks.    Reports taking Coumadin as prescribed and adherence to anticoagulation based dietary restrictions.  Denies bright red blood per rectum or melena, no dark urine or hematuria.    Symptom Yes No Details  Angina       x Activity:  Claudication       x How far:  Syncope       x When:  Stroke       x   Orthopnea       x How many pillows: one  PND       x How often:  CPAP       x How many hrs:  Pedal edema       x       Abd fullness       x    N&V        x  poor appetite; gets full with small meals  Diaphoresis      x  When:  Occasional night sweats (had before VAD)  Bleeding        x   Urine    light yellow  SOB      x  Activity:  Conversational; walking  Palpitations         x When:  ICD shock         x   Hospitlizaitons       x  When/where/why:  8/8 - 01/18/13 LVAD implant  ED visit         x When/where/why:  Other MD         x When/who/why:  Activity     Walking outside and in house; home PT/OT to start this week  Fluid        < 1500  Diet     Low sodium   The patient's drive line dressing (HM II) was removed. The dressing was changed using sterile technique. The site was cleansed with Chlora prep applicators x 2, dried, and guaze dressing with aquacel strip. The site has tissue ingrowth noted; no redness, tenderness, drainage, or  foul odor noted; pt denies fever or chills. Anchor device in place. Caregiver changing dressing daily.     Pt/caregiver deny any alarms or VAD equipment issues.  Pulse: 104 Weight: 159 HR:  105 SPO2: 96 Last weight:  162   Past Medical History  Diagnosis Date  . Paroxysmal supraventricular tachycardia   . Hypokalemia   . Chronic systolic heart failure     a. 05/6107 Echo: EF 15%, mod to sev antlat and inflat HK, inf AK, mild to mod MR, mildly/mod reduced RV fxn, Mod TR, PASP .  [name] History of DVT (deep vein thrombosis)   . Dyslipidemia   . HTN (hypertension)   . Nonischemic cardiomyopathy     a. 06/2012 Echo: EF 15%;  b. 06/2012  Cath: nl except luminal irregs in LAD.  [name] Ventricular tachycardia     a. s/p ICD  . Goiter 2001  . Implantable cardiac defibrillator- Biotronik 2009    Single chamber  . [name] (left bundle branch block)     intermittent  . Palliative care patient     Current Outpatient Prescriptions  Medication Sig Dispense Refill  . amiodarone (PACERONE) 200 MG tablet Take 1 tablet (200 mg total) by mouth daily.  30 tablet  6  . amLODipine (NORVASC) 5 MG tablet Take 2 tablets (10 mg total) by mouth daily.  60 tablet  6  . aspirin EC 325 MG EC tablet Take 1 tablet (325 mg total) by mouth daily.  30 tablet  0  . citalopram (CELEXA) 20 MG tablet Take 1 tablet (20 mg total) by mouth daily.  30 tablet  6  . digoxin (LANOXIN) 0.125 MG tablet Take 0.5 tablets (0.0625 mg total) by mouth daily.  30 tablet  6  . ferrous fumarate-b12-vitamic C-folic acid (TRINSICON / FOLTRIN) capsule Take 1 capsule by mouth 3 (three) times daily after meals.  90 capsule  6  . pantoprazole (PROTONIX) 40 MG tablet Take 1 tablet (40 mg total) by mouth daily.  30 tablet  6  . sildenafil (REVATIO) 20 MG tablet Take 2 tablets (40 mg total) by mouth 3 (three) times daily.  180 tablet  0  . simvastatin (ZOCOR) 10 MG tablet Take 1 tablet (10 mg total) by mouth at bedtime.  90 tablet  3  .  spironolactone (ALDACTONE) 25 MG tablet Take 1 tablet (25 mg total) by mouth daily.  30 tablet  6  . torsemide (DEMADEX) 20 MG tablet Take 1 tablet (20 mg total) by mouth every other day.  30 tablet  3  . warfarin (COUMADIN) 5 MG tablet Take 0.5 tablets (2.5 mg total) by mouth daily.  30 tablet  6  . acetaminophen (TYLENOL) 325 MG tablet Take 650 mg by mouth every 6 (six) hours as needed for pain.      . Cyanocobalamin (B-12 PO) Take 1 tablet by mouth daily.      . traMADol (ULTRAM) 50 MG tablet Take 50-100 mg (1-2 tablets) every 6 hrs PRN for pain  45 tablet  1   No current facility-administered medications for this encounter.    Review of patient's allergies indicates no known allergies.  REVIEW OF SYSTEMS: All systems negative except as listed in HPI, PMH and Problem list.   LVAD INTERROGATION:  Speed:  8590 Flow:  4.2 Power:  4.3 PI: 6.6 Alarms:  Low voltage; no external power Events:  Rare PI Fixed speed:  8600 Low speed limit: 8000  Pt admits to accidentally disconnecting both power sources first night home; pump did not stop due to internal back up battery. We reviewed never disconnecting both power sources at same time with verbal understanding of same.   I reviewed the LVAD parameters from today, and compared the results to the patient's prior recorded data.  No programming changes were made.  The LVAD is functioning within specified parameters.  The patient performs LVAD self-test daily.  LVAD interrogation was negative for any significant power changes, alarms or PI events/speed drops.  LVAD equipment check completed and is in good working order.  Back-up equipment present.   LVAD education done on emergency procedures and precautions and reviewed exit site care.   Physical Exam: Filed Vitals:   01/24/13 1520  BP: 104/0  Pulse: 105  Height: 5\' 3"  (1.6 m)  Weight: 159 lb (72.122 kg)  SpO2: 96%    GENERAL: Well appearing, female who presents to clinic today in no  acute distress. HEENT: normal  NECK: Supple, JVP  .  2+ bilaterally, no bruits.  No lymphadenopathy or thyromegaly appreciated.   CARDIAC:  Mechanical heart sounds with LVAD hum present.  LUNGS:  Clear to auscultation bilaterally.  ABDOMEN:  Soft, round, nontender, positive bowel sounds x4.     LVAD exit site: well-healed and incorporated.  Dressing dry and intact.  No erythema or drainage.  Stabilization device present and accurately applied.  Driveline dressing is being changed daily per sterile technique. EXTREMITIES:  Warm and dry, no cyanosis, clubbing, rash or edema  NEUROLOGIC:  Alert and oriented x 4.  Gait steady.  No aphasia.  No dysarthria.  Affect pleasant.      ASSESSMENT AND PLAN:   1. Status post LVAD implantation - Patient comes to clinic today for routine follow up visit. She looks great. Doing well with ADls and equipment. No significant edema. Continue current demadex dose. Reinforced need for daily weights and reviewed use of sliding scale diuretics. VAD interrogation looks good. Will need to refer to [facility] to initiate transplant work-up in near future.   2.  Anticoagulation management - INR goal 2.0-3.0.  Will evaluate INR value today and follow up with patient for necessary changes.    3.  Elevated MAP 104 - will increase Norvasc to 10 mg daily  4.  Patient will return to clinic one week.   [name] 6:22 PM

## 2013-01-31 ENCOUNTER — Telehealth (HOSPITAL_COMMUNITY): Payer: Self-pay | Admitting: *Deleted

## 2013-01-31 ENCOUNTER — Ambulatory Visit (HOSPITAL_COMMUNITY): Payer: Self-pay | Admitting: *Deleted

## 2013-01-31 ENCOUNTER — Ambulatory Visit (HOSPITAL_COMMUNITY)
Admission: RE | Admit: 2013-01-31 | Discharge: 2013-01-31 | Disposition: A | Discharge status: Home / Self Care | Payer: Commercial Managed Care - PPO | Source: Ambulatory Visit | Attending: Internal Medicine | Admitting: Internal Medicine

## 2013-01-31 VITALS — BP 94/0 | HR 102 | Ht 69.0 in | Wt 156.0 lb

## 2013-01-31 DIAGNOSIS — Z7901 Long term (current) use of anticoagulants: Secondary | ICD-10-CM | POA: Insufficient documentation

## 2013-01-31 DIAGNOSIS — G4700 Insomnia, unspecified: Secondary | ICD-10-CM | POA: Insufficient documentation

## 2013-01-31 DIAGNOSIS — G479 Sleep disorder, unspecified: Secondary | ICD-10-CM

## 2013-01-31 DIAGNOSIS — Z09 Encounter for follow-up examination after completed treatment for conditions other than malignant neoplasm: Secondary | ICD-10-CM | POA: Insufficient documentation

## 2013-01-31 DIAGNOSIS — I447 Left bundle-branch block, unspecified: Secondary | ICD-10-CM | POA: Insufficient documentation

## 2013-01-31 DIAGNOSIS — I509 Heart failure, unspecified: Secondary | ICD-10-CM

## 2013-01-31 DIAGNOSIS — Z86718 Personal history of other venous thrombosis and embolism: Secondary | ICD-10-CM | POA: Insufficient documentation

## 2013-01-31 DIAGNOSIS — E876 Hypokalemia: Secondary | ICD-10-CM | POA: Insufficient documentation

## 2013-01-31 DIAGNOSIS — Z79899 Other long term (current) drug therapy: Secondary | ICD-10-CM | POA: Insufficient documentation

## 2013-01-31 DIAGNOSIS — Z7982 Long term (current) use of aspirin: Secondary | ICD-10-CM | POA: Insufficient documentation

## 2013-01-31 DIAGNOSIS — Z95810 Presence of automatic (implantable) cardiac defibrillator: Secondary | ICD-10-CM | POA: Insufficient documentation

## 2013-01-31 DIAGNOSIS — I428 Other cardiomyopathies: Secondary | ICD-10-CM | POA: Insufficient documentation

## 2013-01-31 DIAGNOSIS — Z95811 Presence of heart assist device: Secondary | ICD-10-CM

## 2013-01-31 DIAGNOSIS — I5022 Chronic systolic (congestive) heart failure: Secondary | ICD-10-CM | POA: Insufficient documentation

## 2013-01-31 DIAGNOSIS — I10 Essential (primary) hypertension: Secondary | ICD-10-CM | POA: Insufficient documentation

## 2013-01-31 DIAGNOSIS — I4891 Unspecified atrial fibrillation: Secondary | ICD-10-CM

## 2013-01-31 DIAGNOSIS — Z515 Encounter for palliative care: Secondary | ICD-10-CM | POA: Insufficient documentation

## 2013-01-31 DIAGNOSIS — I5023 Acute on chronic systolic (congestive) heart failure: Secondary | ICD-10-CM

## 2013-01-31 DIAGNOSIS — Z95818 Presence of other cardiac implants and grafts: Secondary | ICD-10-CM | POA: Insufficient documentation

## 2013-01-31 DIAGNOSIS — I50810 Right heart failure, unspecified: Secondary | ICD-10-CM

## 2013-01-31 DIAGNOSIS — R259 Unspecified abnormal involuntary movements: Secondary | ICD-10-CM | POA: Insufficient documentation

## 2013-01-31 LAB — BASIC METABOLIC PANEL
BUN: 20 mg/dL (ref 6–23)
Calcium: 9.9 mg/dL (ref 8.4–10.5)
Chloride: 99 mEq/L (ref 96–112)
GFR calc Af Amer: 41 mL/min — ABNORMAL LOW (ref >90)
GFR calc non Af Amer: 35 mL/min — ABNORMAL LOW (ref >90)
Sodium: 139 mEq/L (ref 135–145)

## 2013-01-31 LAB — CBC
MCH: 28.2 pg (ref 26.0–34.0)
MCHC: 31.9 g/dL (ref 30.0–36.0)
Platelets: 371 10*3/uL (ref 150–400)
RBC: 3.79 MIL/uL — ABNORMAL LOW (ref 3.87–5.11)
RDW: 16.6 % — ABNORMAL HIGH (ref 11.5–15.5)
WBC: 15.1 10*3/uL — ABNORMAL HIGH (ref 4.0–10.5)

## 2013-01-31 LAB — LACTATE DEHYDROGENASE: LDH: 335 U/L — ABNORMAL HIGH (ref 94–250)

## 2013-01-31 LAB — PROTIME-INR: Prothrombin Time: 39.9 seconds — ABNORMAL HIGH (ref 11.6–15.2)

## 2013-01-31 MED ORDER — POTASSIUM CHLORIDE CRYS ER 10 MEQ PO TBCR: EXTENDED_RELEASE_TABLET | ORAL | Status: DC

## 2013-01-31 MED ORDER — METOPROLOL SUCCINATE ER 25 MG PO TB24: 25.0000 mg | ORAL_TABLET | Freq: Two times a day (BID) | ORAL | Status: DC

## 2013-01-31 MED ORDER — DOXYCYCLINE HYCLATE 50 MG PO CAPS: 100.0000 mg | ORAL_CAPSULE | Freq: Two times a day (BID) | ORAL | Status: DC

## 2013-01-31 MED ORDER — ZOLPIDEM TARTRATE 5 MG PO TABS: 5.0000 mg | ORAL_TABLET | Freq: Every evening | ORAL | Status: DC | PRN

## 2013-01-31 NOTE — Patient Instructions (Signed)
Refer to anticoag flowsheet.

## 2013-01-31 NOTE — Telephone Encounter (Signed)
Called pt per Dr. Gala Romney, instructed to start K-Dur 20 meq daily; needs to take 60 meq today. Also, take Doxycycline 100 mg bid x 7 days for increase WBC and recent drive line trauma. Pt will need labs repeated Monday per home health. Pt wrote instructions and verbalized understanding of above.

## 2013-01-31 NOTE — Patient Instructions (Addendum)
1.  Decrease Amiodarone to 100 mg daily 2.  Start Metoprolol 25 mg twice daily 3.  Tale extra 20 mg Demadex as needed for feet/ankle swelling 4.  Ambien 5 mg at bedtime as needed for sleep 5.  Return to VAD clinic one week

## 2013-02-01 ENCOUNTER — Encounter: Payer: Self-pay | Admitting: Internal Medicine

## 2013-02-01 ENCOUNTER — Ambulatory Visit (INDEPENDENT_AMBULATORY_CARE_PROVIDER_SITE_OTHER): Payer: Commercial Managed Care - PPO | Admitting: Internal Medicine

## 2013-02-01 VITALS — BP 102/0 | HR 99 | Ht 69.0 in | Wt 158.0 lb

## 2013-02-01 DIAGNOSIS — I428 Other cardiomyopathies: Secondary | ICD-10-CM

## 2013-02-01 DIAGNOSIS — I5022 Chronic systolic (congestive) heart failure: Secondary | ICD-10-CM

## 2013-02-01 DIAGNOSIS — Z95810 Presence of automatic (implantable) cardiac defibrillator: Secondary | ICD-10-CM

## 2013-02-01 LAB — ICD DEVICE OBSERVATION
BATTERY VOLTAGE: 3.11 V
BRDY-0002RV: 45 {beats}/min
CHARGE TIME: 12 s
DEV-0020ICD: NEGATIVE
HV IMPEDENCE: 35 Ohm
RV LEAD AMPLITUDE: 11.1 mv
RV LEAD IMPEDENCE ICD: 510 Ohm
RV LEAD THRESHOLD: 0.9 V
TZAT-0001FASTVT: 1
TZAT-0001SLOWVT: 1
TZAT-0004FASTVT: 8
TZAT-0004SLOWVT: 8
TZAT-0005SLOWVT: 85 pct
TZAT-0013FASTVT: 2
TZAT-0013SLOWVT: 6
TZAT-0018FASTVT: NEGATIVE
TZAT-0018SLOWVT: NEGATIVE
TZAT-0020SLOWVT: 1.5 ms
TZON-0003FASTVT: 310 ms
TZON-0003SLOWVT: 360 ms
TZON-0004FASTVT: 30
TZON-0005SLOWVT: 26
TZON-0010FASTVT: 24 ms
TZST-0001FASTVT: 2
TZST-0001FASTVT: 3
TZST-0001FASTVT: 5
TZST-0001FASTVT: 8
TZST-0003FASTVT: 40 J
TZST-0003FASTVT: 40 J
TZST-0003FASTVT: 40 J
TZST-0003FASTVT: 40 J
VENTRICULAR PACING ICD: 0 pct

## 2013-02-01 MED ORDER — ZOLPIDEM TARTRATE 5 MG PO TABS: 5.0000 mg | ORAL_TABLET | Freq: Every evening | ORAL | Status: DC | PRN

## 2013-02-01 MED ORDER — AMIODARONE HCL 200 MG PO TABS: 100.0000 mg | ORAL_TABLET | Freq: Every day | ORAL | Status: DC

## 2013-02-01 NOTE — Patient Instructions (Signed)
Your physician wants you to follow-up in: 12 months with Dr. Taylor. You will receive a reminder letter in the mail two months in advance. If you don't receive a letter, please call our office to schedule the follow-up appointment.    

## 2013-02-01 NOTE — Assessment & Plan Note (Signed)
Her myoclonic ICD is working normally. We'll plan to recheck in several months. There've been no ventricular arrhythmias.

## 2013-02-01 NOTE — Progress Notes (Signed)
HPI Kaitlin [name] returns today for followup. She is a pleasant 67 yo woman with a h/o chronic systolic heart failure with a dilated cardiomyopathy, VT, s/p ICD implant. The patient developed worsening CHF several weeks ago, and failed inotropic therapy. She underwent insertion of a left ventricular assist device. She has improved after a difficult course. She is in the process of being listed for cardiac transplantation. She denies syncope. No recent ICD shock. No chest pain or peripheral edema. She has residual soreness in her chest.  Not on File   Current Outpatient Prescriptions  Medication Sig Dispense Refill  . acetaminophen (TYLENOL) 325 MG tablet Take 650 mg by mouth every 6 (six) hours as needed for pain.      [name] amLODipine (NORVASC) 5 MG tablet Take 2 tablets (10 mg total) by mouth daily.  60 tablet  6  . aspirin EC 325 MG EC tablet Take 1 tablet (325 mg total) by mouth daily.  30 tablet  0  . citalopram (CELEXA) 20 MG tablet Take 1 tablet (20 mg total) by mouth daily.  30 tablet  6  . digoxin (LANOXIN) 0.125 MG tablet Take 0.5 tablets (0.0625 mg total) by mouth daily.  30 tablet  6  . doxycycline (VIBRAMYCIN) 50 MG capsule Take 2 capsules (100 mg total) by mouth 2 (two) times daily.  14 capsule  0  . ferrous fumarate-b12-vitamic C-folic acid (TRINSICON / FOLTRIN) capsule Take 1 capsule by mouth 3 (three) times daily after meals.  90 capsule  6  . metoprolol succinate (TOPROL XL) 25 MG 24 hr tablet Take 1 tablet (25 mg total) by mouth 2 (two) times daily.  60 tablet  6  . pantoprazole (PROTONIX) 40 MG tablet Take 1 tablet (40 mg total) by mouth daily.  30 tablet  6  . potassium chloride (K-DUR,KLOR-CON) 10 MEQ tablet Take 20 meq daily and PRN  75 tablet  3  . sildenafil (REVATIO) 20 MG tablet Take 2 tablets (40 mg total) by mouth 3 (three) times daily.  180 tablet  0  . simvastatin (ZOCOR) 10 MG tablet Take 1 tablet (10 mg total) by mouth at bedtime.  90 tablet  3  .  spironolactone (ALDACTONE) 25 MG tablet Take 1 tablet (25 mg total) by mouth daily.  30 tablet  6  . torsemide (DEMADEX) 20 MG tablet Take 1 tablet (20 mg total) by mouth every other day.  30 tablet  3  . warfarin (COUMADIN) 5 MG tablet Take 0.5 tablets (2.5 mg total) by mouth daily.  30 tablet  6  . amiodarone (PACERONE) 200 MG tablet Take 0.5 tablets (100 mg total) by mouth daily.  30 tablet  6  . zolpidem (AMBIEN) 5 MG tablet Take 1 tablet (5 mg total) by mouth at bedtime as needed for sleep.  30 tablet  2   No current facility-administered medications for this visit.     Past Medical History  Diagnosis Date  . Paroxysmal supraventricular tachycardia   . Hypokalemia   . Chronic systolic heart failure     a. 10/2950 Echo: EF 15%, mod to sev antlat and inflat HK, inf AK, mild to mod MR, mildly/mod reduced RV fxn, Mod TR, PASP .  [name] History of DVT (deep vein thrombosis)   . Dyslipidemia   . HTN (hypertension)   . Nonischemic cardiomyopathy     a. 06/2012 Echo: EF 15%;  b. 06/2012 Cath: nl except luminal irregs in LAD.  [name]  Ventricular tachycardia     a. s/p ICD  . Goiter 2001  . Implantable cardiac defibrillator- Biotronik 2009    Single chamber  . [name] (left bundle branch block)     intermittent  . Palliative care patient     ROS:   All systems reviewed and negative except as noted in the HPI.   Past Surgical History  Procedure Laterality Date  . None    . Cardiac defibrillator placement  01/2009  . Insertion of implantable left ventricular assist device N/A 12/18/2012    Procedure: INSERTION OF IMPLANTABLE LEFT VENTRICULAR ASSIST DEVICE;  Surgeon: Kerin Perna, MD;  Location: [facility] OR;  Service: Open Heart Surgery;  Laterality: N/A;  . Intraoperative transesophageal echocardiogram N/A 12/18/2012    Procedure: INTRAOPERATIVE TRANSESOPHAGEAL ECHOCARDIOGRAM;  Surgeon: Kerin Perna, MD;  Location: [facility] OR;  Service: Open Heart Surgery;  Laterality: N/A;  . Tricuspid valve  replacement N/A 12/18/2012    Procedure: TRICUSPID VALVE REPAIR;  Surgeon: Kerin Perna, MD;  Location: [facility] OR;  Service: Open Heart Surgery;  Laterality: N/A;  . Insertion of implantable left ventricular assist device N/A 12/18/2012    Procedure: INSERTION OF IMPLANTABLE RIGHT VENTRICULAR ASSIST DEVICE;  Surgeon: Kerin Perna, MD;  Location: [facility] OR;  Service: Open Heart Surgery;  Laterality: N/A;  . Removal of centrimag ventricular assist device N/A 12/25/2012    Procedure: REMOVAL OF CENTRIMAG VENTRICULAR ASSIST DEVICE;  Surgeon: Kerin Perna, MD;  Location: [facility] OR;  Service: Open Heart Surgery;  Laterality: N/A;  PUMP STANDBY  . Intraoperative transesophageal echocardiogram N/A 12/25/2012    Procedure: INTRAOPERATIVE TRANSESOPHAGEAL ECHOCARDIOGRAM;  Surgeon: Kerin Perna, MD;  Location: [facility] OR;  Service: Open Heart Surgery;  Laterality: N/A;  . Esophagogastroduodenoscopy N/A 12/26/2012    Procedure: ESOPHAGOGASTRODUODENOSCOPY (EGD);  Surgeon: Beverley Fiedler, MD;  Location[facility] ENDOSCOPY;  Service: Gastroenterology;  Laterality: N/A;  Bedside  . Sternal closure N/A 12/27/2012    Procedure: STERNAL CLOSURE;  Surgeon: Kerin Perna, MD;  Location: [facility] OR;  Service: Thoracic;  Laterality: N/A;  . Mediastinal exploration N/A 12/27/2012    Procedure: MEDIASTINAL EXPLORATION;  Surgeon: Kerin Perna, MD;  Location: [facility] OR;  Service: Thoracic;  Laterality: N/A;     Family History  Problem Relation Age of Onset  . Emphysema Mother   . Heart disease Mother   . Heart attack Father   . Emphysema Brother      History   Social History  . Marital Status: Single    Spouse Name: N/A    Number of Children: N/A  . Years of Education: N/A   Occupational History  . 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 Smoking status: Never Smoker   . Smokeless tobacco: Not on file  . Alcohol Use: No  . Drug Use: No  . Sexual Activity: Not on file   Other Topics Concern  .  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 Lives in [place], not married. 2 daughters/            BP 102/0  Pulse 99  Ht 5\' 9"  (1.753 m)  Wt 158 lb (71.668 kg)  BMI 23.32 kg/m2  SpO2 98%  Physical Exam:  stable appearing 67 year old woman, NAD HEENT: Unremarkable Neck:  7 cm JVD, no thyromegally Back:  No CVA tenderness Lungs:  Clear with no wheezes, rales, or rhonchi. HEART:  Regular rate rhythm, no murmurs, no rubs, no clicks, well-healed median sternotomy Abd:  soft, positive bowel sounds, no organomegally, no rebound, no guarding Ext:  2 plus pulses, no edema, no cyanosis, no clubbing Skin:  No rashes no nodules Neuro:  CN II through XII intact, motor grossly intact   DEVICE  Normal device function.  See PaceArt for details.   Assess/Plan:

## 2013-02-01 NOTE — Assessment & Plan Note (Signed)
She is currently stable status post insertion of a left ventricular assist device. She will followup in our heart failure clinic.

## 2013-02-03 ENCOUNTER — Observation Stay (HOSPITAL_COMMUNITY)
Admission: EM | Admit: 2013-02-03 | Discharge: 2013-02-04 | Disposition: A | Discharge status: Home / Self Care | Payer: Commercial Managed Care - PPO | Attending: Internal Medicine | Admitting: Internal Medicine

## 2013-02-03 ENCOUNTER — Encounter (HOSPITAL_COMMUNITY): Payer: Self-pay

## 2013-02-03 ENCOUNTER — Encounter: Payer: Self-pay | Admitting: *Deleted

## 2013-02-03 DIAGNOSIS — T829XXA Unspecified complication of cardiac and vascular prosthetic device, implant and graft, initial encounter: Secondary | ICD-10-CM

## 2013-02-03 DIAGNOSIS — N189 Chronic kidney disease, unspecified: Secondary | ICD-10-CM | POA: Insufficient documentation

## 2013-02-03 DIAGNOSIS — I2589 Other forms of chronic ischemic heart disease: Secondary | ICD-10-CM | POA: Insufficient documentation

## 2013-02-03 DIAGNOSIS — N179 Acute kidney failure, unspecified: Secondary | ICD-10-CM

## 2013-02-03 DIAGNOSIS — J811 Chronic pulmonary edema: Secondary | ICD-10-CM

## 2013-02-03 DIAGNOSIS — I472 Ventricular tachycardia, unspecified: Secondary | ICD-10-CM

## 2013-02-03 DIAGNOSIS — K7200 Acute and subacute hepatic failure without coma: Secondary | ICD-10-CM

## 2013-02-03 DIAGNOSIS — Z7901 Long term (current) use of anticoagulants: Secondary | ICD-10-CM | POA: Insufficient documentation

## 2013-02-03 DIAGNOSIS — I5022 Chronic systolic (congestive) heart failure: Secondary | ICD-10-CM

## 2013-02-03 DIAGNOSIS — I959 Hypotension, unspecified: Secondary | ICD-10-CM

## 2013-02-03 DIAGNOSIS — R739 Hyperglycemia, unspecified: Secondary | ICD-10-CM

## 2013-02-03 DIAGNOSIS — G4700 Insomnia, unspecified: Secondary | ICD-10-CM

## 2013-02-03 DIAGNOSIS — I369 Nonrheumatic tricuspid valve disorder, unspecified: Secondary | ICD-10-CM

## 2013-02-03 DIAGNOSIS — I471 Supraventricular tachycardia, unspecified: Secondary | ICD-10-CM

## 2013-02-03 DIAGNOSIS — E785 Hyperlipidemia, unspecified: Secondary | ICD-10-CM

## 2013-02-03 DIAGNOSIS — I079 Rheumatic tricuspid valve disease, unspecified: Secondary | ICD-10-CM | POA: Insufficient documentation

## 2013-02-03 DIAGNOSIS — Z0389 Encounter for observation for other suspected diseases and conditions ruled out: Principal | ICD-10-CM | POA: Insufficient documentation

## 2013-02-03 DIAGNOSIS — I10 Essential (primary) hypertension: Secondary | ICD-10-CM

## 2013-02-03 DIAGNOSIS — Z515 Encounter for palliative care: Secondary | ICD-10-CM

## 2013-02-03 DIAGNOSIS — E876 Hypokalemia: Secondary | ICD-10-CM

## 2013-02-03 DIAGNOSIS — K226 Gastro-esophageal laceration-hemorrhage syndrome: Secondary | ICD-10-CM

## 2013-02-03 DIAGNOSIS — R000 Tachycardia, unspecified: Secondary | ICD-10-CM

## 2013-02-03 DIAGNOSIS — I447 Left bundle-branch block, unspecified: Secondary | ICD-10-CM

## 2013-02-03 DIAGNOSIS — R531 Weakness: Secondary | ICD-10-CM

## 2013-02-03 DIAGNOSIS — N390 Urinary tract infection, site not specified: Secondary | ICD-10-CM | POA: Insufficient documentation

## 2013-02-03 DIAGNOSIS — Z95811 Presence of heart assist device: Secondary | ICD-10-CM

## 2013-02-03 DIAGNOSIS — I071 Rheumatic tricuspid insufficiency: Secondary | ICD-10-CM

## 2013-02-03 DIAGNOSIS — R570 Cardiogenic shock: Secondary | ICD-10-CM

## 2013-02-03 DIAGNOSIS — Z95810 Presence of automatic (implantable) cardiac defibrillator: Secondary | ICD-10-CM

## 2013-02-03 DIAGNOSIS — I428 Other cardiomyopathies: Secondary | ICD-10-CM

## 2013-02-03 DIAGNOSIS — I5023 Acute on chronic systolic (congestive) heart failure: Secondary | ICD-10-CM

## 2013-02-03 DIAGNOSIS — I129 Hypertensive chronic kidney disease with stage 1 through stage 4 chronic kidney disease, or unspecified chronic kidney disease: Secondary | ICD-10-CM | POA: Insufficient documentation

## 2013-02-03 DIAGNOSIS — I509 Heart failure, unspecified: Secondary | ICD-10-CM | POA: Insufficient documentation

## 2013-02-03 DIAGNOSIS — Z95818 Presence of other cardiac implants and grafts: Secondary | ICD-10-CM | POA: Insufficient documentation

## 2013-02-03 DIAGNOSIS — Z79899 Other long term (current) drug therapy: Secondary | ICD-10-CM | POA: Insufficient documentation

## 2013-02-03 DIAGNOSIS — I50810 Right heart failure, unspecified: Secondary | ICD-10-CM

## 2013-02-03 LAB — LACTATE DEHYDROGENASE: LDH: 330 U/L — ABNORMAL HIGH (ref 94–250)

## 2013-02-03 LAB — COMPREHENSIVE METABOLIC PANEL
ALT: 23 U/L (ref 0–35)
AST: 23 U/L (ref 0–37)
BUN: 19 mg/dL (ref 6–23)
CO2: 22 mEq/L (ref 19–32)
Calcium: 9.9 mg/dL (ref 8.4–10.5)
Creatinine, Ser: 1.34 mg/dL — ABNORMAL HIGH (ref 0.50–1.10)
GFR calc Af Amer: 47 mL/min — ABNORMAL LOW (ref >90)
GFR calc non Af Amer: 41 mL/min — ABNORMAL LOW (ref >90)
Glucose, Bld: 101 mg/dL — ABNORMAL HIGH (ref 70–99)

## 2013-02-03 LAB — PROTIME-INR
INR: 4.84 — ABNORMAL HIGH (ref 0.00–1.49)
INR: 4.84 — ABNORMAL HIGH (ref 0.00–1.49)
Prothrombin Time: 43.3 seconds — ABNORMAL HIGH (ref 11.6–15.2)

## 2013-02-03 LAB — URINALYSIS, ROUTINE W REFLEX MICROSCOPIC
Glucose, UA: NEGATIVE mg/dL
Ketones, ur: 15 mg/dL — AB
Nitrite: NEGATIVE
Protein, ur: >300 mg/dL — AB
Urobilinogen, UA: 1 mg/dL (ref 0.0–1.0)

## 2013-02-03 LAB — CBC
HCT: 36.2 % (ref 36.0–46.0)
Hemoglobin: 11 g/dL — ABNORMAL LOW (ref 12.0–15.0)
MCH: 27.6 pg (ref 26.0–34.0)
MCV: 90.7 fL (ref 78.0–100.0)
Platelets: 319 10*3/uL (ref 150–400)
RBC: 3.99 MIL/uL (ref 3.87–5.11)

## 2013-02-03 LAB — URINE MICROSCOPIC-ADD ON

## 2013-02-03 LAB — TYPE AND SCREEN: Antibody Screen: NEGATIVE

## 2013-02-03 MED ORDER — HYDRALAZINE HCL 20 MG/ML IJ SOLN: 10.0000 mg | INTRAMUSCULAR | Status: DC | PRN

## 2013-02-03 MED ORDER — WARFARIN SODIUM 2.5 MG PO TABS: 2.5000 mg | ORAL_TABLET | Freq: Every day | ORAL | Status: DC

## 2013-02-03 MED ORDER — SPIRONOLACTONE 25 MG PO TABS
25.0000 mg | ORAL_TABLET | Freq: Every day | ORAL | Status: DC
  Administered 2013-02-03 – 2013-02-04 (×2): 25 mg via ORAL
  Filled 2013-02-03 (×2): qty 1

## 2013-02-03 MED ORDER — VANCOMYCIN HCL IN DEXTROSE 1-5 GM/200ML-% IV SOLN
1000.0000 mg | INTRAVENOUS | Status: DC
  Administered 2013-02-03: 1000 mg via INTRAVENOUS
  Filled 2013-02-03 (×2): qty 200

## 2013-02-03 MED ORDER — SIMVASTATIN 10 MG PO TABS
10.0000 mg | ORAL_TABLET | Freq: Every day | ORAL | Status: DC
  Administered 2013-02-03: 10 mg via ORAL
  Filled 2013-02-03 (×2): qty 1

## 2013-02-03 MED ORDER — DIGOXIN 0.0625 MG HALF TABLET
0.0625 mg | ORAL_TABLET | Freq: Every day | ORAL | Status: DC
  Administered 2013-02-03 – 2013-02-04 (×2): 0.0625 mg via ORAL
  Filled 2013-02-03 (×2): qty 1

## 2013-02-03 MED ORDER — PANTOPRAZOLE SODIUM 40 MG PO TBEC
40.0000 mg | DELAYED_RELEASE_TABLET | Freq: Every day | ORAL | Status: DC
  Administered 2013-02-03 – 2013-02-04 (×2): 40 mg via ORAL
  Filled 2013-02-03 (×2): qty 1

## 2013-02-03 MED ORDER — SILDENAFIL CITRATE 20 MG PO TABS
40.0000 mg | ORAL_TABLET | Freq: Three times a day (TID) | ORAL | Status: DC
  Administered 2013-02-03 – 2013-02-04 (×2): 40 mg via ORAL
  Filled 2013-02-03 (×5): qty 2

## 2013-02-03 MED ORDER — TORSEMIDE 20 MG PO TABS
20.0000 mg | ORAL_TABLET | ORAL | Status: DC
  Administered 2013-02-03: 20 mg via ORAL
  Filled 2013-02-03: qty 1

## 2013-02-03 MED ORDER — ACETAMINOPHEN 325 MG PO TABS
650.0000 mg | ORAL_TABLET | Freq: Four times a day (QID) | ORAL | Status: DC | PRN
  Administered 2013-02-03: 650 mg via ORAL
  Filled 2013-02-03: qty 2

## 2013-02-03 MED ORDER — POTASSIUM CHLORIDE CRYS ER 20 MEQ PO TBCR
20.0000 meq | EXTENDED_RELEASE_TABLET | Freq: Every day | ORAL | Status: DC
  Administered 2013-02-03 – 2013-02-04 (×2): 20 meq via ORAL
  Filled 2013-02-03 (×2): qty 1

## 2013-02-03 MED ORDER — AMIODARONE HCL 100 MG PO TABS
100.0000 mg | ORAL_TABLET | Freq: Every day | ORAL | Status: DC
  Administered 2013-02-03 – 2013-02-04 (×2): 100 mg via ORAL
  Filled 2013-02-03 (×2): qty 1

## 2013-02-03 MED ORDER — CITALOPRAM HYDROBROMIDE 20 MG PO TABS
20.0000 mg | ORAL_TABLET | Freq: Every day | ORAL | Status: DC
  Administered 2013-02-03 – 2013-02-04 (×2): 20 mg via ORAL
  Filled 2013-02-03 (×2): qty 1

## 2013-02-03 MED ORDER — ASPIRIN EC 325 MG PO TBEC
325.0000 mg | DELAYED_RELEASE_TABLET | Freq: Every day | ORAL | Status: DC
  Administered 2013-02-03 – 2013-02-04 (×2): 325 mg via ORAL
  Filled 2013-02-03 (×2): qty 1

## 2013-02-03 MED ORDER — DOXYCYCLINE HYCLATE 100 MG PO TABS
100.0000 mg | ORAL_TABLET | Freq: Two times a day (BID) | ORAL | Status: DC
  Filled 2013-02-03 (×2): qty 1

## 2013-02-03 MED ORDER — HYDRALAZINE HCL 25 MG PO TABS
12.5000 mg | ORAL_TABLET | Freq: Four times a day (QID) | ORAL | Status: DC
  Administered 2013-02-03: 12.5 mg via ORAL
  Administered 2013-02-03 – 2013-02-04 (×2): via ORAL
  Administered 2013-02-04: 12.5 mg via ORAL
  Filled 2013-02-03 (×13): qty 0.5

## 2013-02-03 MED ORDER — FE FUMARATE-B12-VIT C-FA-IFC PO CAPS
1.0000 | ORAL_CAPSULE | Freq: Three times a day (TID) | ORAL | Status: DC
  Administered 2013-02-03 – 2013-02-04 (×3): 1 via ORAL
  Filled 2013-02-03 (×6): qty 1

## 2013-02-03 MED ORDER — AMLODIPINE BESYLATE 10 MG PO TABS
10.0000 mg | ORAL_TABLET | Freq: Every day | ORAL | Status: DC
  Administered 2013-02-03 – 2013-02-04 (×2): 10 mg via ORAL
  Filled 2013-02-03 (×2): qty 1

## 2013-02-03 MED ORDER — ZOLPIDEM TARTRATE 5 MG PO TABS: 5.0000 mg | ORAL_TABLET | Freq: Every evening | ORAL | Status: DC | PRN

## 2013-02-03 MED ORDER — WARFARIN - PHYSICIAN DOSING INPATIENT: Freq: Every day | Status: DC

## 2013-02-03 NOTE — Consult Note (Signed)
ANTICOAGULATION/ANTIBIOTIC CONSULT NOTE - Initial Consult  Pharmacy Consult for Coumadin + Vancomycin Indication: LVAD + driveline infection  No Known Allergies  Patient Measurements: Height: 5\' 3"  (160 cm) Weight: 153 lb (69.4 kg) IBW/kg (Calculated) : 52.4  Vital Signs: Temp: 99 F (37.2 C) (10/04 1128) Temp src: Oral (10/04 1128) Pulse Rate: 67 (10/04 1200)  Labs:  Recent Labs  01/31/13 1511 02/03/13 1138  HGB 10.7* 11.0*  HCT 33.5* 36.2  PLT 371 319  LABPROT 39.9* 43.3*  INR 4.35* 4.84*  CREATININE 1.50* 1.34*    Estimated Creatinine Clearance: 39.1 ml/min (by C-G formula based on Cr of 1.34).   Medical History: Past Medical History  Diagnosis Date  . Paroxysmal supraventricular tachycardia   . Hypokalemia   . Chronic systolic heart failure     a. 12/2954 Echo: EF 15%, mod to sev antlat and inflat HK, inf AK, mild to mod MR, mildly/mod reduced RV fxn, Mod TR, PASP .  [name] History of DVT (deep vein thrombosis)   . Dyslipidemia   . HTN (hypertension)   . Nonischemic cardiomyopathy     a. 06/2012 Echo: EF 15%;  b. 06/2012 Cath: nl except luminal irregs in LAD.  [name] Ventricular tachycardia     a. s/p ICD  . Goiter 2001  . Implantable cardiac defibrillator- Biotronik 2009    Single chamber  . [name] (left bundle branch block)     intermittent  . Palliative care patient    Assessment: 67yof s/p LVAD implant 8/18 presents to the ED for low flow alarms. Controller changed and no further alarms. On coumadin pta for LVAD. INR on admission is supratherapeutic at 4.8. Patient was seen in clinic on 10/1 and INR was 4.35. She was told to hold for 2 days and then resume 2.5mg  daily.  Also at her clinic appointment she was started on po doxycycline for possible drive line infection. Doxycycline can increase coumadin sensitivity. She will switch over to vancomycin while inpatient. She does have some renal insufficiency with sCr 1.34 and CrCl 76ml/min.  Goal of Therapy:   INR 2-3 Monitor platelets by anticoagulation protocol: Yes Vancomycin trough 15-20   Plan:  1) No coumadin tonight 2) Daily INR 3) Vancomycin 1g IV q24 4) Follow renal function, trough at steady state  Fredrik Rigger 02/03/2013,12:56 PM

## 2013-02-03 NOTE — ED Provider Notes (Signed)
CSN: [id number]     Arrival date & time 02/03/13  1113 History   First MD Initiated Contact with Patient 02/03/13 1129     Chief Complaint  Patient presents with  . AICD Problem    LVAD beeping    (Consider location/radiation/quality/duration/timing/severity/associated sxs/prior Treatment) HPI Comments: LVAD alarming at about 3am this morning. Pt is asymptomatic, states she has had no CP, SOB, cough, fever, chills, ab pain, n/v, d/a. No aggravating or alleviating symptoms.  She got a new controller 3 days ago. Hx of ischemic cardiomyopathy requiring RVAD & LVAD placement. Currently has only LVAD.    Past Medical History  Diagnosis Date  . Paroxysmal supraventricular tachycardia   . Hypokalemia   . Chronic systolic heart failure     a. 10/8293 Echo: EF 15%, mod to sev antlat and inflat HK, inf AK, mild to mod MR, mildly/mod reduced RV fxn, Mod TR, PASP .  [name] History of DVT (deep vein thrombosis)   . Dyslipidemia   . HTN (hypertension)   . Nonischemic cardiomyopathy     a. 06/2012 Echo: EF 15%;  b. 06/2012 Cath: nl except luminal irregs in LAD.  [name] Ventricular tachycardia     a. s/p ICD  . Goiter 2001  . Implantable cardiac defibrillator- Biotronik 2009    Single chamber  . [name] (left bundle branch block)     intermittent  . Palliative care patient    Past Surgical History  Procedure Laterality Date  . None    . Cardiac defibrillator placement  01/2009  . Insertion of implantable left ventricular assist device N/A 12/18/2012    Procedure: INSERTION OF IMPLANTABLE LEFT VENTRICULAR ASSIST DEVICE;  Surgeon: Kerin Perna, MD;  Location: [facility] OR;  Service: Open Heart Surgery;  Laterality: N/A;  . Intraoperative transesophageal echocardiogram N/A 12/18/2012    Procedure: INTRAOPERATIVE TRANSESOPHAGEAL ECHOCARDIOGRAM;  Surgeon: Kerin Perna, MD;  Location: [facility] OR;  Service: Open Heart Surgery;  Laterality: N/A;  . Tricuspid valve replacement N/A 12/18/2012    Procedure: TRICUSPID  VALVE REPAIR;  Surgeon: Kerin Perna, MD;  Location: [facility] OR;  Service: Open Heart Surgery;  Laterality: N/A;  . Insertion of implantable left ventricular assist device N/A 12/18/2012    Procedure: INSERTION OF IMPLANTABLE RIGHT VENTRICULAR ASSIST DEVICE;  Surgeon: Kerin Perna, MD;  Location: [facility] OR;  Service: Open Heart Surgery;  Laterality: N/A;  . Removal of centrimag ventricular assist device N/A 12/25/2012    Procedure: REMOVAL OF CENTRIMAG VENTRICULAR ASSIST DEVICE;  Surgeon: Kerin Perna, MD;  Location: [facility] OR;  Service: Open Heart Surgery;  Laterality: N/A;  PUMP STANDBY  . Intraoperative transesophageal echocardiogram N/A 12/25/2012    Procedure: INTRAOPERATIVE TRANSESOPHAGEAL ECHOCARDIOGRAM;  Surgeon: Kerin Perna, MD;  Location: [facility] OR;  Service: Open Heart Surgery;  Laterality: N/A;  . Esophagogastroduodenoscopy N/A 12/26/2012    Procedure: ESOPHAGOGASTRODUODENOSCOPY (EGD);  Surgeon: Beverley Fiedler, MD;  Location: [facility] ENDOSCOPY;  Service: Gastroenterology;  Laterality: N/A;  Bedside  . Sternal closure N/A 12/27/2012    Procedure: STERNAL CLOSURE;  Surgeon: Kerin Perna, MD;  Location: [facility] OR;  Service: Thoracic;  Laterality: N/A;  . Mediastinal exploration N/A 12/27/2012    Procedure: MEDIASTINAL EXPLORATION;  Surgeon: Kerin Perna, MD;  Location: [facility] OR;  Service: Thoracic;  Laterality: N/A;   Family History  Problem Relation Age of Onset  . Emphysema Mother   . Heart disease Mother   . Heart attack Father   . Emphysema Brother  History  Substance Use Topics  . Smoking status: Never Smoker   . Smokeless tobacco: Not on file  . Alcohol Use: No   OB History   Grav Para Term Preterm Abortions TAB SAB Ect Mult Living                 Review of Systems  Constitutional: Negative for fever, chills, diaphoresis, activity change, appetite change and fatigue.  HENT: Negative for congestion, sore throat, facial swelling, rhinorrhea, neck pain and neck stiffness.   Eyes:  Negative for photophobia and discharge.  Respiratory: Negative for cough, chest tightness and shortness of breath.   Cardiovascular: Negative for chest pain, palpitations and leg swelling.  Gastrointestinal: Negative for nausea, vomiting, abdominal pain and diarrhea.  Endocrine: Negative for polydipsia and polyuria.  Genitourinary: Negative for dysuria, frequency, difficulty urinating and pelvic pain.  Musculoskeletal: Negative for back pain and arthralgias.  Skin: Negative for color change and wound.  Allergic/Immunologic: Negative for immunocompromised state.  Neurological: Negative for facial asymmetry, weakness, numbness and headaches.  Hematological: Does not bruise/bleed easily.  Psychiatric/Behavioral: Negative for confusion and agitation.    Allergies  Review of patient's allergies indicates no known allergies.  Home Medications   Current Outpatient Rx  Name  Route  Sig  Dispense  Refill  . acetaminophen (TYLENOL) 325 MG tablet   Oral   Take 650 mg by mouth every 6 (six) hours as needed for pain.         [name] amiodarone (PACERONE) 200 MG tablet   Oral   Take 0.5 tablets (100 mg total) by mouth daily.   30 tablet   6   . amLODipine (NORVASC) 5 MG tablet   Oral   Take 2 tablets (10 mg total) by mouth daily.   60 tablet   6   . aspirin EC 325 MG EC tablet   Oral   Take 1 tablet (325 mg total) by mouth daily.   30 tablet   0   . citalopram (CELEXA) 20 MG tablet   Oral   Take 1 tablet (20 mg total) by mouth daily.   30 tablet   6   . digoxin (LANOXIN) 0.125 MG tablet   Oral   Take 0.5 tablets (0.0625 mg total) by mouth daily.   30 tablet   6   . doxycycline (VIBRAMYCIN) 50 MG capsule   Oral   Take 2 capsules (100 mg total) by mouth 2 (two) times daily.   14 capsule   0   . ferrous fumarate-b12-vitamic C-folic acid (TRINSICON / FOLTRIN) capsule   Oral   Take 1 capsule by mouth 3 (three) times daily after meals.   90 capsule   6   . metoprolol  succinate (TOPROL XL) 25 MG 24 hr tablet   Oral   Take 1 tablet (25 mg total) by mouth 2 (two) times daily.   60 tablet   6   . pantoprazole (PROTONIX) 40 MG tablet   Oral   Take 1 tablet (40 mg total) by mouth daily.   30 tablet   6   . potassium chloride (K-DUR,KLOR-CON) 10 MEQ tablet      Take 20 meq daily and PRN   75 tablet   3   . sildenafil (REVATIO) 20 MG tablet   Oral   Take 2 tablets (40 mg total) by mouth 3 (three) times daily.   180 tablet   0   . simvastatin (ZOCOR) 10 MG tablet  Oral   Take 1 tablet (10 mg total) by mouth at bedtime.   90 tablet   3   . spironolactone (ALDACTONE) 25 MG tablet   Oral   Take 1 tablet (25 mg total) by mouth daily.   30 tablet   6   . torsemide (DEMADEX) 20 MG tablet   Oral   Take 1 tablet (20 mg total) by mouth every other day.   30 tablet   3   . warfarin (COUMADIN) 5 MG tablet   Oral   Take 0.5 tablets (2.5 mg total) by mouth daily.   30 tablet   6   . zolpidem (AMBIEN) 5 MG tablet   Oral   Take 1 tablet (5 mg total) by mouth at bedtime as needed for sleep.   30 tablet   2    BP   Pulse 68  Temp(Src) 99 F (37.2 C) (Oral)  Resp 22  Ht 5\' 3"  (1.6 m)  Wt 153 lb (69.4 kg)  BMI 27.11 kg/m2  SpO2 96% Physical Exam  Constitutional: She is oriented to person, place, and time. She appears well-developed and well-nourished. No distress.  HENT:  Head: Normocephalic and atraumatic.  Mouth/Throat: No oropharyngeal exudate.  Eyes: Pupils are equal, round, and reactive to light.  Neck: Normal range of motion. Neck supple.  Cardiovascular:  Murmur heard. Mechanical heart sounds (LVAD).  No regular pulse.  Pulmonary/Chest: Effort normal and breath sounds normal. No respiratory distress. She has no wheezes. She has no rales.  Abdominal: Soft. Bowel sounds are normal. She exhibits no distension and no mass. There is no tenderness. There is no rebound and no guarding.  Musculoskeletal: Normal range of motion. She  exhibits no edema and no tenderness.  Neurological: She is alert and oriented to person, place, and time.  Skin: Skin is warm and dry.  Psychiatric: She has a normal mood and affect.    ED Course  Procedures (including critical care time) Labs Review Labs Reviewed  CBC - Abnormal; Notable for the following:    WBC 11.9 (*)    Hemoglobin 11.0 (*)    RDW 16.7 (*)    All other components within normal limits  COMPREHENSIVE METABOLIC PANEL - Abnormal; Notable for the following:    Glucose, Bld 101 (*)    Creatinine, Ser 1.34 (*)    Alkaline Phosphatase 123 (*)    GFR calc non Af Amer 41 (*)    GFR calc Af Amer 47 (*)    All other components within normal limits  PROTIME-INR - Abnormal; Notable for the following:    Prothrombin Time 43.3 (*)    INR 4.84 (*)    All other components within normal limits  LACTATE DEHYDROGENASE - Abnormal; Notable for the following:    LDH 330 (*)    All other components within normal limits  PRO B NATRIURETIC PEPTIDE - Abnormal; Notable for the following:    Pro B Natriuretic peptide (BNP) 1382.0 (*)    All other components within normal limits  URINALYSIS, ROUTINE W REFLEX MICROSCOPIC  TYPE AND SCREEN   Imaging Review No results found.  MDM   1. Left ventricular assist device (LVAD) complication, initial encounter   2. Chronic systolic heart failure    Pt is a 67 y.o. female with Pmhx as above who presents with LVAD alarming at about 3am this morning. Pt is asymptomatic, states she has had no CP, SOB, cough, fever, chills, ab pain, n/v, d/a.  Pt  B[place]one with cardiology is present upon pt's arrival along w/ LVAD nurse and believes she is having low flow likely due to recent medication changes. On PE, pt appears in NAD, is awake, alert, appropriate. They are planning on admission, bedside echo and will defer further w/u to them.          Shanna Cisco, MD 02/03/13 1343

## 2013-02-03 NOTE — ED Notes (Signed)
LVAD hum present

## 2013-02-03 NOTE — ED Notes (Signed)
Report given to floor

## 2013-02-03 NOTE — ED Notes (Signed)
Rapid response nurse called to assist transport pt to room

## 2013-02-03 NOTE — Progress Notes (Signed)
Pt's daughter called VAD coordinator stating patient was experiencing red heart "low flow" alarms. States alarms started while pt was on power module; pt switched to batteries and is continuing to have low flow alarms. Pt denies dizziness, syncope, or palpitations; pt is lying in bed. Confirmed green light is illuminated on controller indicating pump is running. Pt switched from batteries to power module with continued alarms. Instructed to call 911 for transport to ED. Dr. Gala Romney and ED charge nurse notified.  VAD coordinator met patient in ED; continuing to have intermittent low flow alarms; EMS personal state she has had no alarms since their arrival to home or during transport. Controller history interrogated and revealed full screens of low flow alarms starting at @ 8:30am this morning. Pt remains asymptomatic with doppler MAP 110.    VAD parameters on [id number]:  Flows:  2.4 - 2.6  Speed:  8590  Power:  3.6  PI:  5.5 - 6.9  Controller changed to [id number] with the following parameters:  Flow:  3.7  Speed:  8600  PI:  5.8  Power:  4.2  Primary and back up controllers programmed to:  Fixed speed:  8600  Low speed limit:  8000  Dr. Gala Romney in to assess patient; planned admission to 2W.

## 2013-02-03 NOTE — ED Notes (Signed)
New controller on Wednesday, onset 3am machine started to beep- the red heart alert button.  No symptoms.

## 2013-02-03 NOTE — ED Notes (Signed)
MD at bedside. 

## 2013-02-03 NOTE — ED Notes (Signed)
Family at bedside. 

## 2013-02-03 NOTE — Progress Notes (Signed)
  Echocardiogram 2D Echocardiogram limited has been performed.  Kaitlin [name] [name] 02/03/2013, 12:49 PM

## 2013-02-03 NOTE — ED Notes (Addendum)
Kirt Boys, LVAD nurse at bedside

## 2013-02-03 NOTE — H&P (Addendum)
Advanced Heart Failure Team History and Physical Note    HPI:     Kaitlin [name] is a 67 y/o woman with h/o severe HTN, [name], VT s/p Biotronik ICD (2009) and 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   We saw her in clinic this week and was doing very well. She ahd an episode of driveline trauma so was started on abx. Metoprolol 25 bid added for high MAPs. Seen in EP clinic with device check (Biotronik) on Thursday and was fine   Called this morning due to Red Heart alarms on VAD due to low flow. Brought to ER. Feeling fine. I interrogated device personally with VAD coordinator present. Multiple PI events and several low flow alarms. MAP 110. Weight unchanged. No palpitations, syncope or HF symptoms.   Echo in ER: LVAD placement looks good. AoV not opening. RV mod to severely HK with moderate to severe TR. Septum midline.     Review of Systems: [y] = yes, [ ]  = no   General: Weight gain [ ] ; Weight loss [ ] ; Anorexia [ ] ; Fatigue [name] ]; Fever [ ] ; Chills [ ] ; Weakness [ ]   Cardiac: Chest pain/pressure [ ] ; Resting SOB [ ] ; Exertional SOB [ ] ; Orthopnea [ ] ; Pedal Edema [ ] ; Palpitations [ ] ; Syncope [ ] ; Presyncope [ ] ; Paroxysmal nocturnal dyspnea[ ]   Pulmonary: Cough [ ] ; Wheezing[ ] ; Hemoptysis[ ] ; Sputum [ ] ; Snoring [ ]   GI: Vomiting[ ] ; Dysphagia[ ] ; Melena[ ] ; Hematochezia [ ] ; Heartburn[ ] ; Abdominal pain [ ] ; Constipation [ ] ; Diarrhea [ ] ; BRBPR [ ]   GU: Hematuria[ ] ; Dysuria [ ] ; Nocturia[ ]   Vascular: Pain in legs with walking [ ] ; Pain in feet with lying flat [ ] ; Non-healing sores [ ] ; Stroke [ ] ; TIA [ ] ; Slurred speech [ ] ;  Neuro: Headaches[ ] ; Vertigo[ ] ; Seizures[ ] ; Paresthesias[ ] ;Blurred vision [ ] ; Diplopia [ ] ; Vision changes [ ]   Ortho/Skin: Arthritis [ ] ; Joint pain [ ] ; Muscle pain [ ] ;  Joint swelling [ ] ; Back Pain [ ] ; Rash [ ]   Psych: Depression[ ] ; Anxiety[ ]   Heme: Bleeding problems [ ] ; Clotting disorders [ ] ; Anemia [ ]   Endocrine: Diabetes [ ] ; Thyroid dysfunction[ ]   Home Medications Prior to Admission medications   Medication Sig Start Date End Date Taking? Authorizing Provider  acetaminophen (TYLENOL) 325 MG tablet Take 650 mg by mouth every 6 (six) hours as needed for pain.   Yes Historical Provider, MD  amiodarone (PACERONE) 200 MG tablet Take 0.5 tablets (100 mg total) by mouth daily. 02/01/13  Yes Bevelyn Buckles Bensimhon, MD  amLODipine (NORVASC) 5 MG tablet Take 2 tablets (10 mg total) by mouth daily. 01/24/13  Yes Dolores Patty, MD  aspirin EC 325 MG EC tablet Take 1 tablet (325 mg total) by mouth daily. 01/18/13  Yes Aundria Rud, NP  citalopram (CELEXA) 20 MG tablet Take 1 tablet (20 mg total) by mouth daily. 01/18/13  Yes Aundria Rud, NP  digoxin (LANOXIN) 0.125 MG tablet Take 0.5 tablets (0.0625 mg total) by mouth daily. 01/18/13  Yes Aundria Rud, NP  doxycycline (VIBRAMYCIN) 50 MG capsule Take 2 capsules (100 mg total) by mouth 2 (two) times daily. 01/31/13  Yes Aundria Rud, NP  ferrous fumarate-b12-vitamic C-folic  acid (TRINSICON / FOLTRIN) capsule Take 1 capsule by mouth 3 (three) times daily after meals. 01/18/13  Yes Aundria Rud, NP  metoprolol succinate (TOPROL XL) 25 MG 24 hr tablet Take 1 tablet (25 mg total) by mouth 2 (two) times daily. 01/31/13  Yes Bevelyn Buckles Bensimhon, MD  pantoprazole (PROTONIX) 40 MG tablet Take 1 tablet (40 mg total) by mouth daily. 01/18/13  Yes Aundria Rud, NP  potassium chloride (K-DUR,KLOR-CON) 10 MEQ tablet Take 20 meq daily and PRN 01/31/13  Yes Aundria Rud, NP  sildenafil (REVATIO) 20 MG tablet Take 2 tablets (40 mg total) by mouth 3 (three) times daily. 01/18/13  Yes Aundria Rud, NP  simvastatin (ZOCOR) 10 MG tablet Take 1 tablet (10 mg total) by mouth at bedtime. 01/18/13  Yes Aundria Rud, NP    spironolactone (ALDACTONE) 25 MG tablet Take 1 tablet (25 mg total) by mouth daily. 01/18/13  Yes Aundria Rud, NP  torsemide (DEMADEX) 20 MG tablet Take 1 tablet (20 mg total) by mouth every other day. 01/20/13  Yes Aundria Rud, NP  warfarin (COUMADIN) 5 MG tablet Take 0.5 tablets (2.5 mg total) by mouth daily. 01/18/13  Yes Aundria Rud, NP  zolpidem (AMBIEN) 5 MG tablet Take 1 tablet (5 mg total) by mouth at bedtime as needed for sleep. 02/01/13   Dolores Patty, MD    Past Medical History: Past Medical History  Diagnosis Date  . Paroxysmal supraventricular tachycardia   . Hypokalemia   . Chronic systolic heart failure     a. 05/6107 Echo: EF 15%, mod to sev antlat and inflat HK, inf AK, mild to mod MR, mildly/mod reduced RV fxn, Mod TR, PASP .  [name] History of DVT (deep vein thrombosis)   . Dyslipidemia   . HTN (hypertension)   . Nonischemic cardiomyopathy     a. 06/2012 Echo: EF 15%;  b. 06/2012 Cath: nl except luminal irregs in LAD.  [name] Ventricular tachycardia     a. s/p ICD  . Goiter 2001  . Implantable cardiac defibrillator- Biotronik 2009    Single chamber  . [name] (left bundle branch block)     intermittent  . Palliative care patient     Past Surgical History: Past Surgical History  Procedure Laterality Date  . None    . Cardiac defibrillator placement  01/2009  . Insertion of implantable left ventricular assist device N/A 12/18/2012    Procedure: INSERTION OF IMPLANTABLE LEFT VENTRICULAR ASSIST DEVICE;  Surgeon: Kerin Perna, MD;  Location: [facility] OR;  Service: Open Heart Surgery;  Laterality: N/A;  . Intraoperative transesophageal echocardiogram N/A 12/18/2012    Procedure: INTRAOPERATIVE TRANSESOPHAGEAL ECHOCARDIOGRAM;  Surgeon: Kerin Perna, MD;  Location: [facility] OR;  Service: Open Heart Surgery;  Laterality: N/A;  . Tricuspid valve replacement N/A 12/18/2012    Procedure: TRICUSPID VALVE REPAIR;  Surgeon: Kerin Perna, MD;  Location: [facility] OR;  Service: Open  Heart Surgery;  Laterality: N/A;  . Insertion of implantable left ventricular assist device N/A 12/18/2012    Procedure: INSERTION OF IMPLANTABLE RIGHT VENTRICULAR ASSIST DEVICE;  Surgeon: Kerin Perna, MD;  Location: [facility] OR;  Service: Open Heart Surgery;  Laterality: N/A;  . Removal of centrimag ventricular assist device N/A 12/25/2012    Procedure: REMOVAL OF CENTRIMAG VENTRICULAR ASSIST DEVICE;  Surgeon: Kerin Perna, MD;  Location: [facility] OR;  Service: Open Heart Surgery;  Laterality: N/A;  PUMP STANDBY  . Intraoperative transesophageal echocardiogram N/A  12/25/2012    Procedure: INTRAOPERATIVE TRANSESOPHAGEAL ECHOCARDIOGRAM;  Surgeon: Kerin Perna, MD;  Location: [facility] OR;  Service: Open Heart Surgery;  Laterality: N/A;  . Esophagogastroduodenoscopy N/A 12/26/2012    Procedure: ESOPHAGOGASTRODUODENOSCOPY (EGD);  Surgeon: Beverley Fiedler, MD;  Location: [facility] ENDOSCOPY;  Service: Gastroenterology;  Laterality: N/A;  Bedside  . Sternal closure N/A 12/27/2012    Procedure: STERNAL CLOSURE;  Surgeon: Kerin Perna, MD;  Location: [facility] OR;  Service: Thoracic;  Laterality: N/A;  . Mediastinal exploration N/A 12/27/2012    Procedure: MEDIASTINAL EXPLORATION;  Surgeon: Kerin Perna, MD;  Location: [facility] OR;  Service: Thoracic;  Laterality: N/A;    Family History: Family History  Problem Relation Age of Onset  . Emphysema Mother   . Heart disease Mother   . Heart attack Father   . Emphysema Brother     Social History: History   Social History  . Marital Status: Single    Spouse Name: N/A    Number of Children: N/A  . Years of Education: N/A   Occupational History  . 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 Smoking status: Never Smoker   . Smokeless tobacco: None  . Alcohol Use: No  . Drug Use: No  . Sexual Activity: None   Other Topics Concern  . 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 Lives in [place], not  married. 2 daughters/           Allergies:  No Known Allergies  Objective:    Vital Signs:   Temp:  [99 F (37.2 C)] 99 F (37.2 C) (10/04 1128) Pulse Rate:  [67-77] 67 (10/04 1200) Resp:  [16-19] 18 (10/04 1200) SpO2:  [95 %-98 %] 95 % (10/04 1200) Weight:  [69.4 kg (153 lb)] 69.4 kg (153 lb) (10/04 1128)   Filed Weights   02/03/13 1128  Weight: 69.4 kg (153 lb)    Physical Exam: GENERAL: Well appearing. No distress HEENT: normal  NECK: Supple, JVP 10. 2+ bilaterally, no bruits. No lymphadenopathy or thyromegaly appreciated.  CARDIAC: Mechanical heart sounds with LVAD hum present.  LUNGS: Clear to auscultation bilaterally.  ABDOMEN: Soft, nontender, nondistended positive bowel sounds x4.  LVAD exit site: well-healed and incorporated. Dressing dry and intact. No erythema or drainage. Stabilization device present and accurately applied. Driveline dressing is being changed daily per sterile technique.  EXTREMITIES: Warm and dry, no cyanosis, clubbing, rash or edema  NEUROLOGIC: Alert and oriented x 4. Gait steady. No aphasia. No dysarthria. Affect pleasant.    Telemetry: SR 70 Labs: Basic Metabolic Panel:  Recent Labs Lab 01/31/13 1511 02/03/13 1138  NA 139 139  K 3.0* 3.7  CL 99 102  CO2 24 22  GLUCOSE 145* 101*  BUN 20 19  CREATININE 1.50* 1.34*  CALCIUM 9.9 9.9    Liver Function Tests: No results found for this basename: AST, ALT, ALKPHOS, BILITOT, PROT, ALBUMIN,  in the last 168 hours No results found for this basename: LIPASE, AMYLASE,  in the last 168 hours No results found for this basename: AMMONIA,  in the last 168 hours  CBC:  Recent Labs Lab 01/31/13 1511 02/03/13 1138  WBC 15.1* 11.9*  HGB 10.7* 11.0*  HCT 33.5* 36.2  MCV 88.4 90.7  PLT 371 319    Cardiac Enzymes: No results found for this basename: CKTOTAL, CKMB, CKMBINDEX, TROPONINI,  in the last 168 hours  BNP: BNP (  last 3 results)  Recent Labs  06/19/12 0800 12/19/12 0314    PROBNP 16784.0* 1470.0*    CBG: No results found for this basename: GLUCAP,  in the last 168 hours  Coagulation Studies:  Recent Labs  01/31/13 1511 02/03/13 1138  LABPROT 39.9* 43.3*  INR 4.35* 4.84*    Other results: EKG: SR 70. [name] No ST-T wave abnormalities.   Imaging:  No results found.      Assessment:  1. Low flow alarms on LVAD 2. Chronic biventricular HF s/p HM II LVAD on 8/18 3. H/o VT s/o Biotronik ICD 4. [name] 5. HTN 6. Chronic renal failure - baseline 1.6  Plan/Discussion:     I suspect low flow alarms may be due to restarting b-blocker in setting of RV failure but also my be related to high MAPs or her using the old controller. We changed her controller in the ER and have not had further alarms. Echo shows significant RV dysfunction with mod-severe TR. I have contacted Biotronik rep to interrogate device.  Will bring in for 23 hour obs to watch. Stop lopressor. Use hydralazine for HTN as needed.   Pharmacy to dose coumadin. INR up due to antibiotics  WBC up. Will stop doxy. Treat with vanc while in house. Check UA  Length of Stay: 0   Kaitlin [name] 02/03/2013, 12:21 PM  Advanced Heart Failure Team Pager [phone] (M-F; 7a - 4p)  Please contact [facility] for night-coverage after hours (4p -7a ) and weekends on [email or web address]

## 2013-02-04 LAB — CBC
Hemoglobin: 9.9 g/dL — ABNORMAL LOW (ref 12.0–15.0)
Platelets: 295 10*3/uL (ref 150–400)
RBC: 3.55 MIL/uL — ABNORMAL LOW (ref 3.87–5.11)
WBC: 10.2 10*3/uL (ref 4.0–10.5)

## 2013-02-04 LAB — BASIC METABOLIC PANEL
CO2: 24 mEq/L (ref 19–32)
Chloride: 102 mEq/L (ref 96–112)
Creatinine, Ser: 1.39 mg/dL — ABNORMAL HIGH (ref 0.50–1.10)
GFR calc Af Amer: 45 mL/min — ABNORMAL LOW (ref >90)
Glucose, Bld: 96 mg/dL (ref 70–99)
Potassium: 3.6 mEq/L (ref 3.5–5.1)

## 2013-02-04 LAB — PROTIME-INR
INR: 4.36 — ABNORMAL HIGH (ref 0.00–1.49)
Prothrombin Time: 40 seconds — ABNORMAL HIGH (ref 11.6–15.2)

## 2013-02-04 MED ORDER — HYDRALAZINE HCL 25 MG PO TABS: 12.5000 mg | ORAL_TABLET | Freq: Three times a day (TID) | ORAL | Status: DC

## 2013-02-04 MED ORDER — WARFARIN SODIUM 5 MG PO TABS: 2.5000 mg | ORAL_TABLET | Freq: Every day | ORAL | Status: DC

## 2013-02-04 MED ORDER — CEPHALEXIN 500 MG PO CAPS: 500.0000 mg | ORAL_CAPSULE | Freq: Three times a day (TID) | ORAL | Status: DC

## 2013-02-04 NOTE — Discharge Summary (Signed)
Advanced Heart Failure Team  Discharge Summary   Patient ID: Kaitlin [name] MRN: [id number], DOB/AGE: 12/19/1945 67 y.o. 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   We saw her in clinic on 10/1 and was doing very well. She had an episode of driveline trauma so was started on doxycycline. Metoprolol 25 bid added for high MAPs. Seen in EP clinic with device check (Biotronik) on Thursday and was fine   Called on the morning of admit due to Red Heart alarms on VAD due to low flow. Brought to ER. Feeling fine. I interrogated device personally with VAD coordinator present. Multiple PI events and several low flow alarms. MAP 110. Weight unchanged. No palpitations, syncope or HF symptoms.  Echo in ER: LVAD placement looks good. AoV not opening. RV mod to severely HK with moderate to severe TR. Septum midline. Interrogation of ICD without arrhythmias  It was suspected that low flow alarms were due to restarting b-blocker in setting of RV failure or her using the old controller. The controlled was switched in the ER and her b-blocker was stopped. Hydralazine was started for BP control and she was brought in for 24-hour observation.  Driveline site looked good. Vancomycin started for leukocytosis. UA mildly dirty.   Did well overnight. VAD interrogated personally this am. Only 1 PI event. Feels good. No fevers or chills. MAPs 80s  INR 4.8 -> 4.3   Discharge Weight Range: 156 pounds Discharge Vitals:  Blood pressure , pulse 68, temperature 98.1 F (36.7 C), temperature source Oral, resp. rate 22, height 5\' 3"  (1.6 m), weight 70.988 kg (156 lb 8 oz), SpO2 95.00%. MAP 80 Physical Exam:  GENERAL: Well appearing. No distress  HEENT: normal  NECK: Supple, JVP 9-10. 2+ bilaterally, no bruits. No lymphadenopathy or thyromegaly appreciated.  CARDIAC: Mechanical heart sounds with LVAD hum present.  LUNGS: Clear to auscultation bilaterally.  ABDOMEN: Soft, nontender, nondistended positive bowel sounds x4.  LVAD exit site: well-healed and incorporated. Dressing dry and intact. No erythema or drainage. Stabilization device present and accurately applied. Driveline dressing is being changed daily per sterile technique.  EXTREMITIES: Warm and dry, no cyanosis, clubbing, rash or edema  NEUROLOGIC: Alert and oriented x 4. Gait steady. No aphasia. No dysarthria. Affect pleasant.    Labs: Lab Results  Component Value Date   WBC 10.2 02/04/2013   HGB 9.9* 02/04/2013   HCT 31.9* 02/04/2013   MCV 89.9 02/04/2013   PLT 295 02/04/2013    Recent Labs Lab 02/03/13 1138 02/04/13 0450  NA 139 138  K 3.7 3.6  CL 102 102  CO2 22 24  BUN 19 18  CREATININE 1.34* 1.39*  CALCIUM 9.9 9.3  PROT 7.1  --   BILITOT 0.9  --   ALKPHOS 123*  --   ALT 23  --   AST 23  --   GLUCOSE 101* 96   Lab Results  Component Value Date   CHOL 153 04/14/2010  HDL 44.60 04/14/2010   LDLCALC 92 04/14/2010   TRIG 159* 12/25/2012   BNP (last 3 results)  Recent Labs  06/19/12 0800 12/19/12 0314 02/03/13 1138  PROBNP 16784.0* 1470.0* 1382.0*    Diagnostic Studies/Procedures   No results found.  Discharge Medications     Medication List    ASK your doctor about these medications       acetaminophen 325 MG tablet  Commonly known as:  TYLENOL  Take 650 mg by mouth every 6 (six) hours as needed for pain.     amiodarone 200 MG tablet  Commonly known as:  PACERONE  Take 0.5 tablets (100 mg total) by mouth  daily.     amLODipine 5 MG tablet  Commonly known as:  NORVASC  Take 2 tablets (10 mg total) by mouth daily.     aspirin 325 MG EC tablet  Take 1 tablet (325 mg total) by mouth daily.     citalopram 20 MG tablet  Commonly known as:  CELEXA  Take 1 tablet (20 mg total) by mouth daily.     digoxin 0.125 MG tablet  Commonly known as:  LANOXIN  Take 0.5 tablets (0.0625 mg total) by mouth daily.     doxycycline 50 MG capsule  Commonly known as:  VIBRAMYCIN  Take 2 capsules (100 mg total) by mouth 2 (two) times daily.     ferrous fumarate-b12-vitamic C-folic acid capsule  Commonly known as:  TRINSICON / FOLTRIN  Take 1 capsule by mouth 3 (three) times daily after meals.     metoprolol succinate 25 MG 24 hr tablet  Commonly known as:  TOPROL XL  Take 1 tablet (25 mg total) by mouth 2 (two) times daily.     pantoprazole 40 MG tablet  Commonly known as:  PROTONIX  Take 1 tablet (40 mg total) by mouth daily.     potassium chloride 10 MEQ tablet  Commonly known as:  K-DUR,KLOR-CON  Take 20 meq daily and PRN     sildenafil 20 MG tablet  Commonly known as:  REVATIO  Take 2 tablets (40 mg total) by mouth 3 (three) times daily.     simvastatin 10 MG tablet  Commonly known as:  ZOCOR  Take 1 tablet (10 mg total) by mouth at bedtime.     spironolactone 25 MG tablet  Commonly known as:  ALDACTONE  Take 1 tablet (25 mg total) by mouth daily.     torsemide 20 MG tablet  Commonly known as:  DEMADEX  Take 1 tablet (20 mg total) by mouth every other day.     warfarin 5 MG tablet  Commonly known as:  COUMADIN  Take 0.5 tablets (2.5 mg total) by mouth daily.     zolpidem 5 MG tablet  Commonly known as:  AMBIEN  Take 1 tablet (5 mg total) by mouth at bedtime as needed for sleep.        Disposition   The patient will be discharged in stable condition to home.      Future Appointments Provider Department Dept Phone   02/07/2013 2:30 PM [facility] [phone]   05/08/2013 8:25 AM [facility] [phone]         Important points:  1) Stop metoprolol succinate (toprol) completely 2) Start Keflex 500 mg tid for 7 days 3) Start hydralazine 12.5mg  tid (use 25 mg tabs and take half) 4) Hold coumadin  today (10/5)     On Monday 10/6. Take 1.25 mg (quarter of the orange tablet) at night      Starting Tuesday 10/7 resume 2.5 mg (half tablet) per night every night 5) Stop doxycycline 6) F/u HF clinic Wed 10/8 @ 2:30 pm  Duration of Discharge Encounter: Greater than 35 minutes   Signed, Arvilla Meres  02/04/2013, 12:27 PM

## 2013-02-04 NOTE — Progress Notes (Signed)
Pt discharge home. Discharge instructions reviewed. Prescriptions given. No distress noted. Encouraged to call with needs.

## 2013-02-04 NOTE — Plan of Care (Signed)
Problem: Phase I Progression Outcomes Goal: EF % per last Echo/documented,Core Reminder form on chart Outcome: Completed/Met Date Met:  02/04/13 15 - 25%

## 2013-02-05 ENCOUNTER — Other Ambulatory Visit: Payer: Self-pay | Admitting: *Deleted

## 2013-02-05 DIAGNOSIS — Z95811 Presence of heart assist device: Secondary | ICD-10-CM

## 2013-02-05 DIAGNOSIS — Z7901 Long term (current) use of anticoagulants: Secondary | ICD-10-CM

## 2013-02-06 NOTE — Progress Notes (Signed)
HPI:  Ms. 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 EF 15-25% with mild to moderate RV dysfunction  8/18 Underwent HM II LVAD implant (under destination criteria) along with TV repair.Had c.b post-op couse requiring take back to the OR for bleeding and temproray RVAD support.   Follow up: She is doing absolutely wonderfully. Energy level increasing. Able to do all ADLs without problems. Comfortable with equipment and dressing changes. No significant alarms. No bleeding. Denies edema, orthopnea, PND or fevers.   Denies LVAD alarms. Admits to dropping her LVAD bag and with associated driveline trauma and small amount bleeding at site.  Denies ICD shocks.    Reports taking Coumadin as prescribed and adherence to anticoagulation based dietary restrictions.  Denies bright red blood per rectum or melena, no dark urine or hematuria.    Symptom Yes No Details  Angina       x Activity:  Claudication       x How far:  Syncope       x When:  Stroke       x   Orthopnea       x How many pillows: one  PND       x How often:  CPAP       x How many hrs:  Pedal edema       x       Abd fullness       x    N&V        x  poor appetite; gets full with small meals  Diaphoresis      x  When:  Occasional night sweats (had before VAD)  Bleeding        x   Urine    light yellow  SOB      x  Activity:  Walking 1/2 block level ground  Palpitations         x When:  ICD shock         x   Hospitlizaitons       x  When/where/why:  8/8 - 01/18/13 LVAD implant  ED visit         x When/where/why:  Other MD         x When/who/why:  Activity     Walking outside and in house; home PT/OT next week; light cooking   Fluid        < 1500  Diet     Low sodium   The patient's drive line dressing (HM II) was removed. The dressing was changed using sterile technique. The site was cleansed with Chlora prep applicators x 2, dried, and guaze dressing with aquacel strip.  The site has less tissue ingrowth from last week; no redness, tenderness, drainage, or foul odor noted; pt denies fever or chills. Anchor device in place. Caregiver changing dressing daily.     Pt/caregiver deny any alarms or VAD equipment issues.   Past Medical History  Diagnosis Date  . Paroxysmal supraventricular tachycardia   . Hypokalemia   . Chronic systolic heart failure     a. 05/6107 Echo: EF 15%, mod to sev antlat and inflat HK, inf AK, mild to mod MR, mildly/mod reduced RV fxn, Mod TR, PASP .  [name] History of DVT (deep vein thrombosis)   . Dyslipidemia   . HTN (hypertension)   . Nonischemic cardiomyopathy     a. 06/2012 Echo: EF 15%;  b. 06/2012 Cath: nl except luminal irregs in LAD.  [name] Ventricular tachycardia     a. s/p ICD  . Goiter 2001  . Implantable cardiac defibrillator- Biotronik 2009    Single chamber  . [name] (left bundle branch block)     intermittent  . Palliative care patient     Current Outpatient Prescriptions  Medication Sig Dispense Refill  . acetaminophen (TYLENOL) 325 MG tablet Take 650 mg by mouth every 6 (six) hours as needed for pain.      [name] amiodarone (PACERONE) 200 MG tablet Take 0.5 tablets (100 mg total) by mouth daily.  30 tablet  6  . amLODipine (NORVASC) 5 MG tablet Take 2 tablets (10 mg total) by mouth daily.  60 tablet  6  . aspirin EC 325 MG EC tablet Take 1 tablet (325 mg total) by mouth daily.  30 tablet  0  . cephALEXin (KEFLEX) 500 MG capsule Take 1 capsule (500 mg total) by mouth 3 (three) times daily.  21 capsule  0  . citalopram (CELEXA) 20 MG tablet Take 1 tablet (20 mg total) by mouth daily.  30 tablet  6  . digoxin (LANOXIN) 0.125 MG tablet Take 0.5 tablets (0.0625 mg total) by mouth daily.  30 tablet  6  . ferrous fumarate-b12-vitamic C-folic acid (TRINSICON / FOLTRIN) capsule Take 1 capsule by mouth 3 (three) times daily after meals.  90 capsule  6  . hydrALAZINE (APRESOLINE) 25 MG tablet Take 0.5 tablets (12.5 mg total) by  mouth 3 (three) times daily.  60 tablet  6  . pantoprazole (PROTONIX) 40 MG tablet Take 1 tablet (40 mg total) by mouth daily.  30 tablet  6  . potassium chloride (K-DUR,KLOR-CON) 10 MEQ tablet Take 20 meq daily and PRN  75 tablet  3  . sildenafil (REVATIO) 20 MG tablet Take 2 tablets (40 mg total) by mouth 3 (three) times daily.  180 tablet  0  . simvastatin (ZOCOR) 10 MG tablet Take 1 tablet (10 mg total) by mouth at bedtime.  90 tablet  3  . spironolactone (ALDACTONE) 25 MG tablet Take 1 tablet (25 mg total) by mouth daily.  30 tablet  6  . torsemide (DEMADEX) 20 MG tablet Take 1 tablet (20 mg total) by mouth every other day.  30 tablet  3  . warfarin (COUMADIN) 5 MG tablet Take 0.5 tablets (2.5 mg total) by mouth daily.  30 tablet  6  . zolpidem (AMBIEN) 5 MG tablet Take 1 tablet (5 mg total) by mouth at bedtime as needed for sleep.  30 tablet  2   No current facility-administered medications for this encounter.    Review of patient's allergies indicates no known allergies.  REVIEW OF SYSTEMS: All systems negative except as listed in HPI, PMH and Problem list.  LVAD INTERROGATION:  Speed:  8600 Flow:  4.3 Power:  4.7 PI: 6.2 Alarms:  none Events:  none Fixed speed:  8600 Low speed limit: 8000   I reviewed the LVAD parameters from today, and compared the results to the patient's prior recorded data.  No programming changes were made.  The LVAD is functioning within specified parameters.  The patient performs LVAD self-test daily.  LVAD interrogation was negative for any significant power changes, alarms or PI events/speed drops.  LVAD equipment check completed and is in good working order.  Back-up equipment present.   LVAD education done on emergency procedures and precautions and reviewed exit site care.   Physical  Exam: Filed Vitals:   01/31/13 1424  BP: 94/0  Pulse: 102  Height: 5\' 9"  (1.753 m)  Weight: 156 lb (70.761 kg)  SpO2: 96%  Last weight:  159  GENERAL: Well  appearing, female who presents to clinic today in no acute distress. HEENT: normal  NECK: Supple, JVP 9  .  2+ bilaterally, no bruits.  No lymphadenopathy or thyromegaly appreciated.   CARDIAC:  Mechanical heart sounds with LVAD hum present.  LUNGS:  Clear to auscultation bilaterally.  ABDOMEN:  Soft, round, nontender, positive bowel sounds x4.     LVAD exit site: well-healed and incorporated.  Dressing dry and intact.  No erythema or drainage.  Stabilization device present and accurately applied.  Driveline dressing is being changed daily per sterile technique. EXTREMITIES:  Warm and dry, no cyanosis, clubbing, rash or edema  NEUROLOGIC:  Alert and oriented x 4.  Gait steady.  No aphasia.  No dysarthria.  Affect pleasant.      ASSESSMENT AND PLAN:  1. Status post LVAD implantation - Patient comes to clinic today for one week follow up visit. She looks great. Doing well with ADls and equipment. Some pedal edema noted, will take extra Demadex 20 mg today. Continue demadex 20 mg daily, instructed pt to take extra 20 mg for pedal edema.  Reinforced need for daily weights and reviewed use of sliding scale diuretics. VAD interrogation looks good. Will need to refer to [facility] to initiate transplant work-up in near future.  2. Anticoagulation management - INR goal 2.0-3.0. INR supratherapeutic today at 4.35. Will hold coumadin for two days then resume 2.5 daily; will re-check INR Monday.  3. HTN - MAP better at 94 today  - continue Norvasc to 10 mg daily. Add back Toprol 25 bid.  4. Pt c/o hand tremors - will decrease Amiodarone dose to 100 mg daily and add Toprol 25 mg bid. 5. Hypokalemia - will add K-Dur 20 meq daily; will give 60 meq today; re-check BMET Monday 6. Leukocytosis; due to drive line trauma will add Doxycycline 100 mg bid x 7 days; re-check CBC Monday 7. Insomnia - will add Ambien 5 mg prn HS.  Return to clinic in one week.    Reuel Boom Elya Tarquinio,MD 11:38 PM

## 2013-02-07 ENCOUNTER — Ambulatory Visit (HOSPITAL_COMMUNITY)
Admission: RE | Admit: 2013-02-07 | Discharge: 2013-02-07 | Disposition: A | Discharge status: Home / Self Care | Payer: Commercial Managed Care - PPO | Source: Ambulatory Visit | Attending: Cardiology | Admitting: Cardiology

## 2013-02-07 ENCOUNTER — Other Ambulatory Visit (HOSPITAL_COMMUNITY): Payer: Self-pay | Admitting: Anesthesiology

## 2013-02-07 ENCOUNTER — Ambulatory Visit (HOSPITAL_COMMUNITY): Payer: Self-pay | Admitting: *Deleted

## 2013-02-07 ENCOUNTER — Encounter: Payer: Self-pay | Admitting: Internal Medicine

## 2013-02-07 VITALS — BP 96/0 | HR 97 | Ht 69.0 in | Wt 157.6 lb

## 2013-02-07 DIAGNOSIS — Z09 Encounter for follow-up examination after completed treatment for conditions other than malignant neoplasm: Secondary | ICD-10-CM | POA: Insufficient documentation

## 2013-02-07 DIAGNOSIS — I50810 Right heart failure, unspecified: Secondary | ICD-10-CM

## 2013-02-07 DIAGNOSIS — Z95811 Presence of heart assist device: Secondary | ICD-10-CM

## 2013-02-07 DIAGNOSIS — E079 Disorder of thyroid, unspecified: Secondary | ICD-10-CM

## 2013-02-07 DIAGNOSIS — E876 Hypokalemia: Secondary | ICD-10-CM

## 2013-02-07 DIAGNOSIS — Z79899 Other long term (current) drug therapy: Secondary | ICD-10-CM | POA: Insufficient documentation

## 2013-02-07 DIAGNOSIS — E785 Hyperlipidemia, unspecified: Secondary | ICD-10-CM | POA: Insufficient documentation

## 2013-02-07 DIAGNOSIS — Z95818 Presence of other cardiac implants and grafts: Secondary | ICD-10-CM

## 2013-02-07 DIAGNOSIS — I509 Heart failure, unspecified: Secondary | ICD-10-CM

## 2013-02-07 DIAGNOSIS — I472 Ventricular tachycardia: Secondary | ICD-10-CM

## 2013-02-07 DIAGNOSIS — I10 Essential (primary) hypertension: Secondary | ICD-10-CM | POA: Insufficient documentation

## 2013-02-07 DIAGNOSIS — I5023 Acute on chronic systolic (congestive) heart failure: Secondary | ICD-10-CM

## 2013-02-07 DIAGNOSIS — Z515 Encounter for palliative care: Secondary | ICD-10-CM | POA: Insufficient documentation

## 2013-02-07 DIAGNOSIS — I428 Other cardiomyopathies: Secondary | ICD-10-CM | POA: Insufficient documentation

## 2013-02-07 DIAGNOSIS — I447 Left bundle-branch block, unspecified: Secondary | ICD-10-CM | POA: Insufficient documentation

## 2013-02-07 DIAGNOSIS — Z8673 Personal history of transient ischemic attack (TIA), and cerebral infarction without residual deficits: Secondary | ICD-10-CM | POA: Insufficient documentation

## 2013-02-07 DIAGNOSIS — I5022 Chronic systolic (congestive) heart failure: Secondary | ICD-10-CM | POA: Insufficient documentation

## 2013-02-07 DIAGNOSIS — Z7901 Long term (current) use of anticoagulants: Secondary | ICD-10-CM | POA: Insufficient documentation

## 2013-02-07 DIAGNOSIS — R259 Unspecified abnormal involuntary movements: Secondary | ICD-10-CM | POA: Insufficient documentation

## 2013-02-07 DIAGNOSIS — G252 Other specified forms of tremor: Secondary | ICD-10-CM

## 2013-02-07 LAB — CBC
HCT: 35.1 % — ABNORMAL LOW (ref 36.0–46.0)
MCH: 27.8 pg (ref 26.0–34.0)
MCV: 88.9 fL (ref 78.0–100.0)
RBC: 3.95 MIL/uL (ref 3.87–5.11)
WBC: 13.2 10*3/uL — ABNORMAL HIGH (ref 4.0–10.5)

## 2013-02-07 LAB — BASIC METABOLIC PANEL
BUN: 19 mg/dL (ref 6–23)
CO2: 20 mEq/L (ref 19–32)
Chloride: 103 mEq/L (ref 96–112)
Creatinine, Ser: 1.62 mg/dL — ABNORMAL HIGH (ref 0.50–1.10)
GFR calc non Af Amer: 32 mL/min — ABNORMAL LOW (ref >90)

## 2013-02-07 LAB — LACTATE DEHYDROGENASE: LDH: 339 U/L — ABNORMAL HIGH (ref 94–250)

## 2013-02-07 LAB — DIGOXIN LEVEL: Digoxin Level: 0.5 ng/mL — ABNORMAL LOW (ref 0.8–2.0)

## 2013-02-07 LAB — T4, FREE: Free T4: 2.33 ng/dL — ABNORMAL HIGH (ref 0.80–1.80)

## 2013-02-07 MED ORDER — POTASSIUM CHLORIDE CRYS ER 10 MEQ PO TBCR: EXTENDED_RELEASE_TABLET | ORAL | Status: DC

## 2013-02-07 MED ORDER — HYDRALAZINE HCL 25 MG PO TABS: 25.0000 mg | ORAL_TABLET | Freq: Three times a day (TID) | ORAL | Status: DC

## 2013-02-07 MED ORDER — WARFARIN SODIUM 2.5 MG PO TABS: 2.5000 mg | ORAL_TABLET | Freq: Every day | ORAL | Status: DC

## 2013-02-07 MED ORDER — TORSEMIDE 20 MG PO TABS: 20.0000 mg | ORAL_TABLET | Freq: Every day | ORAL | Status: DC

## 2013-02-07 NOTE — Patient Instructions (Signed)
Coumadin 2.5mg daily 

## 2013-02-07 NOTE — Progress Notes (Signed)
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 EF 15-25% with mild to moderate RV dysfunction   8/18 Underwent HM II LVAD implant (under destination criteria) along with TV repair.Had c.b post-op couse requiring take back to the OR for bleeding and temproray RVAD support.   Follow up: Was admitted this past Saturday-Sunday for low flow alarms. Controller was switched out and she no longer had alarms. Her BB was also stopped felt that alarms could also possibly be from recurrent RV failure. Doing well. Reports being able to do all ADLs and is getting around better. Denies SOB, orthopnea, or CP. Working with PT and goal to go back to work PT in November. No bleeding.    No LVAD alarms.  Denies ICD shocks.    Reports taking Coumadin as prescribed and adherence to anticoagulation based dietary restrictions.  Denies bright red blood per rectum or melena, no dark urine or hematuria.    Symptom Yes No Details  Angina       x Activity:  Claudication       x How far:  Syncope       x When:  Stroke       x   Orthopnea       x How many pillows: one  PND       x How often:  CPAP       x How many hrs:  Pedal edema       x       Abd fullness       x   N&V        x  poor appetite; gets full with small meals  Diaphoresis      x  When:  Occasional night sweats (had before VAD)  Bleeding        x   Urine    light yellow  SOB      x  Activity:  Walking 1/2 block level ground  Palpitations         x When:  ICD shock         x   Hospitlizaitons       x  When/where/why:  10/4 - 10/5 Low flow alarms  ED visit         x When/where/why:  Other MD         x When/who/why: Dr. Ladona Ridgel EP doctor  Activity   Walking outside and in house; PT/OT 1 x week;  light  cooking   Fluid        < 1500  Diet     Low sodium   The patient's drive line dressing (HM II) was removed. The dressing was changed using sterile technique. The site was cleansed with  Chlora prep applicators x 2, dried, and guaze dressing with aquacel strip. The site has almost complete less tissue ingrowth; no redness, tenderness, drainage, or foul odor noted; pt denies fever or chills. Anchor device in place. Caregiver changing dressing daily. Some skin irritation from tape, daughter thinks skin prep is irritating skin and is not using; will advance dressing changes to every other day.     Past Medical History  Diagnosis Date  . Paroxysmal supraventricular tachycardia   . Hypokalemia   . Chronic systolic heart failure     a. 01/6044 Echo: EF 15%, mod to sev antlat and inflat HK, inf AK, mild to mod MR,  mildly/mod reduced RV fxn, Mod TR, PASP .  [name] History of DVT (deep vein thrombosis)   . Dyslipidemia   . HTN (hypertension)   . Nonischemic cardiomyopathy     a. 06/2012 Echo: EF 15%;  b. 06/2012 Cath: nl except luminal irregs in LAD.  [name] Ventricular tachycardia     a. s/p ICD  . Goiter 2001  . Implantable cardiac defibrillator- Biotronik 2009    Single chamber  . [name] (left bundle branch block)     intermittent  . Palliative care patient     Current Outpatient Prescriptions  Medication Sig Dispense Refill  . acetaminophen (TYLENOL) 325 MG tablet Take 650 mg by mouth every 6 (six) hours as needed for pain.      [name] amiodarone (PACERONE) 200 MG tablet Take 0.5 tablets (100 mg total) by mouth daily.  30 tablet  6  . amLODipine (NORVASC) 5 MG tablet Take 2 tablets (10 mg total) by mouth daily.  60 tablet  6  . aspirin EC 325 MG EC tablet Take 1 tablet (325 mg total) by mouth daily.  30 tablet  0  . cephALEXin (KEFLEX) 500 MG capsule Take 1 capsule (500 mg total) by mouth 3 (three) times daily.  21 capsule  0  . citalopram (CELEXA) 20 MG tablet Take 1 tablet (20 mg total) by mouth daily.  30 tablet  6  . digoxin (LANOXIN) 0.125 MG tablet Take 0.5 tablets (0.0625 mg total) by mouth daily.  30 tablet  6  . ferrous fumarate-b12-vitamic C-folic acid (TRINSICON / FOLTRIN)  capsule Take 1 capsule by mouth 3 (three) times daily after meals.  90 capsule  6  . hydrALAZINE (APRESOLINE) 25 MG tablet Take 0.5 tablets (12.5 mg total) by mouth 3 (three) times daily.  60 tablet  6  . pantoprazole (PROTONIX) 40 MG tablet Take 1 tablet (40 mg total) by mouth daily.  30 tablet  6  . potassium chloride (K-DUR,KLOR-CON) 10 MEQ tablet Take 20 meq daily and PRN  75 tablet  3  . sildenafil (REVATIO) 20 MG tablet Take 2 tablets (40 mg total) by mouth 3 (three) times daily.  180 tablet  0  . simvastatin (ZOCOR) 10 MG tablet Take 1 tablet (10 mg total) by mouth at bedtime.  90 tablet  3  . spironolactone (ALDACTONE) 25 MG tablet Take 1 tablet (25 mg total) by mouth daily.  30 tablet  6  . torsemide (DEMADEX) 20 MG tablet Take 1 tablet (20 mg total) by mouth every other day.  30 tablet  3  . warfarin (COUMADIN) 5 MG tablet Take 0.5 tablets (2.5 mg total) by mouth daily.  30 tablet  6  . zolpidem (AMBIEN) 5 MG tablet Take 1 tablet (5 mg total) by mouth at bedtime as needed for sleep.  30 tablet  2   No current facility-administered medications for this encounter.    Review of patient's allergies indicates no known allergies.  REVIEW OF SYSTEMS: All systems negative except as listed in HPI, PMH and Problem list.  LVAD INTERROGATION:  Speed:  8600 Flow:  4.3 Power:  4.7 PI: 6.2 Alarms:  none Events:  none Fixed speed:  8600 Low speed limit: 8000  I reviewed the LVAD parameters from today, and compared the results to the patient's prior recorded data.  No programming changes were made.  The LVAD is functioning within specified parameters.  The patient performs LVAD self-test daily.  LVAD interrogation was negative for any significant power  changes, alarms or PI events/speed drops.  LVAD equipment check completed and is in good working order.  Back-up equipment present.   LVAD education done on emergency procedures and precautions and reviewed exit site care.   Physical  Exam: Filed Vitals:   02/07/13 1510  BP: 96/0  Height: 5\' 9"  (1.753 m)  Weight: 157 lb 9.6 oz (71.487 kg)  Last weight:  159  GENERAL: Well appearing, female who presents to clinic today in no acute distress; daughters present HEENT: normal  NECK: Supple, JVP 8-9  .  2+ bilaterally, no bruits.  No lymphadenopathy or thyromegaly appreciated.   CARDIAC:  Mechanical heart sounds with LVAD hum present.  LUNGS:  Clear to auscultation bilaterally.  ABDOMEN:  Soft, round, nontender, positive bowel sounds x4.     LVAD exit site: well-healed and semi incorporated.  Dressing dry and intact.  No erythema or drainage.  Stabilization device present and accurately applied.  EXTREMITIES:  Warm and dry, no cyanosis, clubbing, or rash. Trace edema  NEUROLOGIC:  Alert and oriented x 4.  Gait steady.  No aphasia.  No dysarthria.  Affect pleasant.      ASSESSMENT AND PLAN:  1. Status post LVAD implantation - - She continues to get better and is gaining her strength back. Was recently admitted for low flow alarms and was felt could be related her her RV or her controller. Her controller was switched out and Toprol was stopped. Since then she has not had any low flow alarms and feels quite good. - LDH and INR today. - Driveline semi-incorporated will change dressing to QOD. 2. Anticoagulation management - INR goal 2.0-3.0.  - INR pending. Will have pharmacy dose 3. HTN - MAPs still elevated today 96, will increase hydralazine to 25 mg TID. - BMET today shows Cr slightly elevated, concerned may be component of RV dysfunction so will not go up on BB 4. Tremors - Still having tremors, but improving. - Will get TSH, free T3 and free T4 5. Chronic systolic HF, s/P LVAD 12/2012 - NYHA III symptoms and volume status slightly elevated. Will increase torsemide to 20 mg QOD and get labs (BMET, pro-BNP today). Will increse KCL to 30 meq daily.  - Instructed to bring all medications to next visit to clarify what she  is taking. She should also be on 100 mg Amiodarone daily.  - Continue to work with PT. - Reinforced the need and importance of daily weights, a low sodium diet, and fluid restriction (less than 2 L a day). Instructed to call the HF clinic if weight increases more than 3 lbs overnight or 5 lbs in a week.   F/U 1 week  Ulla Potash B NP-C 11:07 AM

## 2013-02-07 NOTE — Patient Instructions (Addendum)
1.  Increase Torsemide (Demadex) to 20 mg every day unless weight < 150 lbs. 2. Increase K-Dur (potassium pill) to 30 meq daily (3 pills per day). 3. Make sure you are taking 1/2 tablet of your Amiodarone (Pacerone) 4. Check on status of your Sidenfil refill at [facility]. 5. Pick up your Coumadin 2.5 mg tablets (put your 5 mg tablets away). 6. Change dressing every other day. 7. Return to VAD clinic in one week (bring your pill bottles with you) 8. Increase hydralazine to 25 mg three times daily

## 2013-02-11 ENCOUNTER — Encounter (HOSPITAL_COMMUNITY): Payer: Self-pay | Admitting: Emergency Medicine

## 2013-02-11 ENCOUNTER — Emergency Department (HOSPITAL_COMMUNITY): Payer: Commercial Managed Care - PPO

## 2013-02-11 ENCOUNTER — Inpatient Hospital Stay (HOSPITAL_COMMUNITY)
Admission: EM | Admit: 2013-02-11 | Discharge: 2013-02-13 | DRG: 287 | Disposition: A | Discharge status: Home / Self Care | Payer: Commercial Managed Care - PPO | Attending: Internal Medicine | Admitting: Internal Medicine

## 2013-02-11 DIAGNOSIS — Z95811 Presence of heart assist device: Secondary | ICD-10-CM

## 2013-02-11 DIAGNOSIS — I4729 Other ventricular tachycardia: Secondary | ICD-10-CM | POA: Diagnosis present

## 2013-02-11 DIAGNOSIS — I5023 Acute on chronic systolic (congestive) heart failure: Principal | ICD-10-CM | POA: Diagnosis present

## 2013-02-11 DIAGNOSIS — I472 Ventricular tachycardia, unspecified: Secondary | ICD-10-CM | POA: Diagnosis present

## 2013-02-11 DIAGNOSIS — Z954 Presence of other heart-valve replacement: Secondary | ICD-10-CM

## 2013-02-11 DIAGNOSIS — I509 Heart failure, unspecified: Secondary | ICD-10-CM | POA: Diagnosis present

## 2013-02-11 DIAGNOSIS — Z79899 Other long term (current) drug therapy: Secondary | ICD-10-CM

## 2013-02-11 DIAGNOSIS — E785 Hyperlipidemia, unspecified: Secondary | ICD-10-CM | POA: Diagnosis present

## 2013-02-11 DIAGNOSIS — Z86718 Personal history of other venous thrombosis and embolism: Secondary | ICD-10-CM

## 2013-02-11 DIAGNOSIS — I50810 Right heart failure, unspecified: Secondary | ICD-10-CM

## 2013-02-11 DIAGNOSIS — N183 Chronic kidney disease, stage 3 unspecified: Secondary | ICD-10-CM | POA: Diagnosis present

## 2013-02-11 DIAGNOSIS — Z7982 Long term (current) use of aspirin: Secondary | ICD-10-CM

## 2013-02-11 DIAGNOSIS — I428 Other cardiomyopathies: Secondary | ICD-10-CM | POA: Diagnosis present

## 2013-02-11 DIAGNOSIS — I079 Rheumatic tricuspid valve disease, unspecified: Secondary | ICD-10-CM | POA: Diagnosis present

## 2013-02-11 DIAGNOSIS — Z95818 Presence of other cardiac implants and grafts: Secondary | ICD-10-CM

## 2013-02-11 DIAGNOSIS — Z7901 Long term (current) use of anticoagulants: Secondary | ICD-10-CM

## 2013-02-11 DIAGNOSIS — I129 Hypertensive chronic kidney disease with stage 1 through stage 4 chronic kidney disease, or unspecified chronic kidney disease: Secondary | ICD-10-CM | POA: Diagnosis present

## 2013-02-11 DIAGNOSIS — I447 Left bundle-branch block, unspecified: Secondary | ICD-10-CM | POA: Diagnosis present

## 2013-02-11 LAB — URINE MICROSCOPIC-ADD ON

## 2013-02-11 LAB — COMPREHENSIVE METABOLIC PANEL
AST: 20 U/L (ref 0–37)
BUN: 17 mg/dL (ref 6–23)
CO2: 17 mEq/L — ABNORMAL LOW (ref 19–32)
Chloride: 100 mEq/L (ref 96–112)
Creatinine, Ser: 1.32 mg/dL — ABNORMAL HIGH (ref 0.50–1.10)
GFR calc non Af Amer: 41 mL/min — ABNORMAL LOW (ref >90)
Potassium: 3.9 mEq/L (ref 3.5–5.1)
Total Bilirubin: 1 mg/dL (ref 0.3–1.2)

## 2013-02-11 LAB — CBC
HCT: 35.1 % — ABNORMAL LOW (ref 36.0–46.0)
Hemoglobin: 11.4 g/dL — ABNORMAL LOW (ref 12.0–15.0)
MCV: 86.2 fL (ref 78.0–100.0)
Platelets: 339 10*3/uL (ref 150–400)
RBC: 4.07 MIL/uL (ref 3.87–5.11)
WBC: 12.9 10*3/uL — ABNORMAL HIGH (ref 4.0–10.5)

## 2013-02-11 LAB — PROTIME-INR
INR: 1.93 — ABNORMAL HIGH (ref 0.00–1.49)
Prothrombin Time: 21.5 seconds — ABNORMAL HIGH (ref 11.6–15.2)

## 2013-02-11 LAB — URINALYSIS, ROUTINE W REFLEX MICROSCOPIC
Bilirubin Urine: NEGATIVE
Glucose, UA: NEGATIVE mg/dL
Ketones, ur: NEGATIVE mg/dL
Nitrite: NEGATIVE
Urobilinogen, UA: 0.2 mg/dL (ref 0.0–1.0)
pH: 6.5 (ref 5.0–8.0)

## 2013-02-11 LAB — DIGOXIN LEVEL: Digoxin Level: 0.5 ng/mL — ABNORMAL LOW (ref 0.8–2.0)

## 2013-02-11 MED ORDER — POTASSIUM CHLORIDE CRYS ER 20 MEQ PO TBCR
20.0000 meq | EXTENDED_RELEASE_TABLET | Freq: Every day | ORAL | Status: DC
  Administered 2013-02-12 – 2013-02-13 (×2): 20 meq via ORAL
  Filled 2013-02-11 (×2): qty 1

## 2013-02-11 MED ORDER — SODIUM CHLORIDE 0.9 % IJ SOLN
3.0000 mL | Freq: Two times a day (BID) | INTRAMUSCULAR | Status: DC
  Administered 2013-02-11: 3 mL via INTRAVENOUS

## 2013-02-11 MED ORDER — PANTOPRAZOLE SODIUM 40 MG PO TBEC
40.0000 mg | DELAYED_RELEASE_TABLET | Freq: Every day | ORAL | Status: DC
  Administered 2013-02-12 – 2013-02-13 (×2): 40 mg via ORAL
  Filled 2013-02-11 (×2): qty 1

## 2013-02-11 MED ORDER — SODIUM CHLORIDE 0.9 % IJ SOLN: 3.0000 mL | INTRAMUSCULAR | Status: DC | PRN

## 2013-02-11 MED ORDER — ASPIRIN EC 325 MG PO TBEC
325.0000 mg | DELAYED_RELEASE_TABLET | Freq: Every day | ORAL | Status: DC
  Administered 2013-02-12 – 2013-02-13 (×2): 325 mg via ORAL
  Filled 2013-02-11 (×2): qty 1

## 2013-02-11 MED ORDER — FUROSEMIDE 10 MG/ML IJ SOLN
80.0000 mg | Freq: Once | INTRAMUSCULAR | Status: AC
  Administered 2013-02-11: 80 mg via INTRAVENOUS

## 2013-02-11 MED ORDER — SPIRONOLACTONE 25 MG PO TABS
25.0000 mg | ORAL_TABLET | Freq: Every day | ORAL | Status: DC
  Administered 2013-02-12 – 2013-02-13 (×2): 25 mg via ORAL
  Filled 2013-02-11 (×2): qty 1

## 2013-02-11 MED ORDER — WARFARIN SODIUM 1 MG PO TABS
1.0000 mg | ORAL_TABLET | ORAL | Status: AC
  Administered 2013-02-11: 1 mg via ORAL
  Filled 2013-02-11: qty 1

## 2013-02-11 MED ORDER — SODIUM CHLORIDE 0.9 % IJ SOLN: 3.0000 mL | Freq: Two times a day (BID) | INTRAMUSCULAR | Status: DC

## 2013-02-11 MED ORDER — CITALOPRAM HYDROBROMIDE 20 MG PO TABS
20.0000 mg | ORAL_TABLET | Freq: Every day | ORAL | Status: DC
  Administered 2013-02-12 – 2013-02-13 (×2): 20 mg via ORAL
  Filled 2013-02-11 (×2): qty 1

## 2013-02-11 MED ORDER — SODIUM CHLORIDE 0.9 % IV SOLN: 250.0000 mL | INTRAVENOUS | Status: DC | PRN

## 2013-02-11 MED ORDER — WARFARIN - PHARMACIST DOSING INPATIENT: Freq: Every day | Status: DC

## 2013-02-11 MED ORDER — SIMVASTATIN 10 MG PO TABS
10.0000 mg | ORAL_TABLET | Freq: Every day | ORAL | Status: DC
  Administered 2013-02-11 – 2013-02-12 (×2): 10 mg via ORAL
  Filled 2013-02-11 (×3): qty 1

## 2013-02-11 MED ORDER — FUROSEMIDE 10 MG/ML IJ SOLN
INTRAMUSCULAR | Status: AC
  Filled 2013-02-11: qty 8

## 2013-02-11 MED ORDER — ASPIRIN 81 MG PO CHEW
81.0000 mg | CHEWABLE_TABLET | ORAL | Status: AC
  Administered 2013-02-12: 81 mg via ORAL

## 2013-02-11 MED ORDER — AMLODIPINE BESYLATE 10 MG PO TABS
10.0000 mg | ORAL_TABLET | Freq: Every day | ORAL | Status: DC
  Administered 2013-02-12: 10 mg via ORAL
  Filled 2013-02-11 (×2): qty 1

## 2013-02-11 MED ORDER — ZOLPIDEM TARTRATE 5 MG PO TABS
5.0000 mg | ORAL_TABLET | Freq: Every evening | ORAL | Status: DC | PRN
  Administered 2013-02-11 – 2013-02-13 (×2): 5 mg via ORAL
  Filled 2013-02-11 (×2): qty 1

## 2013-02-11 MED ORDER — HYDRALAZINE HCL 25 MG PO TABS
25.0000 mg | ORAL_TABLET | Freq: Three times a day (TID) | ORAL | Status: DC
  Administered 2013-02-11 – 2013-02-13 (×4): 25 mg via ORAL
  Filled 2013-02-11 (×7): qty 1

## 2013-02-11 MED ORDER — SODIUM CHLORIDE 0.9 % IJ SOLN
3.0000 mL | Freq: Two times a day (BID) | INTRAMUSCULAR | Status: DC
  Administered 2013-02-12 – 2013-02-13 (×3): 3 mL via INTRAVENOUS

## 2013-02-11 MED ORDER — ACETAMINOPHEN 325 MG PO TABS
650.0000 mg | ORAL_TABLET | Freq: Four times a day (QID) | ORAL | Status: DC | PRN
  Administered 2013-02-11: 650 mg via ORAL
  Filled 2013-02-11 (×2): qty 2

## 2013-02-11 MED ORDER — TORSEMIDE 20 MG PO TABS
20.0000 mg | ORAL_TABLET | Freq: Every day | ORAL | Status: DC
  Administered 2013-02-12: 20 mg via ORAL
  Filled 2013-02-11 (×2): qty 1

## 2013-02-11 MED ORDER — DIGOXIN 0.0625 MG HALF TABLET
0.0625 mg | ORAL_TABLET | Freq: Every day | ORAL | Status: DC
  Administered 2013-02-12 – 2013-02-13 (×2): 0.0625 mg via ORAL
  Filled 2013-02-11 (×2): qty 1

## 2013-02-11 MED ORDER — AMIODARONE HCL 100 MG PO TABS
100.0000 mg | ORAL_TABLET | Freq: Every day | ORAL | Status: DC
  Administered 2013-02-12 – 2013-02-13 (×2): 100 mg via ORAL
  Filled 2013-02-11 (×2): qty 1

## 2013-02-11 MED ORDER — SILDENAFIL CITRATE 20 MG PO TABS
40.0000 mg | ORAL_TABLET | Freq: Three times a day (TID) | ORAL | Status: DC
  Administered 2013-02-11 – 2013-02-13 (×5): 40 mg via ORAL
  Filled 2013-02-11 (×7): qty 2

## 2013-02-11 NOTE — ED Notes (Signed)
Pt up to bedside commode, increase SOB, UA obtained, placed pt back on 2L O2 via [place]

## 2013-02-11 NOTE — H&P (Signed)
Advanced Heart Failure Team History and Physical Note   Primary Cardiologist:  Shaianne Nucci  Reason for Admission: A/C heart failure   HPI:    Kaitlin [name] is a 67 y/o woman with h/o HTN, [name], VT s/p Biotronik ICD (2009) and severe systolic 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 EF 15-25% with moderate RV dysfunction   8/18 Underwent HM II LVAD implant (under destination criteria) along with TV repair. Post-op couse c/b by severe RV failure and shock requiring take back to the OR for bleeding and temproray RVAD support.   Was admitted to hospital last week for multiple PI events in setting of restarting b-blocker. Echo showed severe RV dysfunction with recurrent severe TR. PI events resolved with changing controller and stopping b-blocker. Also treated with abx (Keflex) for possible UTI and driveline trauma. Seen in clinic 10/8 and feeling OK. Cr up slightly from 1.4->1.6. Hydralazine titrated due to elevated MAPs. Hgb stable.   Since Clinic visit has been getting progressively weaker with more dyspnea on exertion. Unable to walk to the bathroom. Denies orthopnea, PND or edema. Weight stable at 155. Feels like lower chest and epigastrum are squeezing. Has not been taking amiodarone or torsemide for at least 3 days. Has been taking coumadin as prescribed.  No bleeding, f/c, diarrhea, cough.   VAD interrogation in ER: Speed 8600 Power 4.7 Flow 4.6 PI 6.3  1 PI event over past 48 hours  ECG: SR 90 with [name]. CXR mild CHF   Review of Systems: [y] = yes, [ ]  = no   General: Weight gain [ ] ; Weight loss [ ] ; Anorexia [ ] ; Fatigue [ y]; Fever [ ] ; Chills [ ] ; Weakness [ y]  Cardiac: Chest pain/pressure [name] ]; Resting SOB [ ] ; Exertional SOB [name] ]; Orthopnea [ ] ; Pedal Edema [ ] ; Palpitations [ ] ; Syncope [ ] ; Presyncope [ ] ; Paroxysmal nocturnal dyspnea[ ]   Pulmonary: Cough [ ] ; Wheezing[ ] ; Hemoptysis[ ] ; Sputum [ ] ; Snoring [ ]   GI: Vomiting[ ] ; Dysphagia[ ] ; Melena[ ] ; Hematochezia [ ] ; Heartburn[ ] ; Abdominal  pain [ ] ; Constipation [ ] ; Diarrhea [ ] ; BRBPR [ ]   GU: Hematuria[ ] ; Dysuria [ ] ; Nocturia[ ]   Vascular: Pain in legs with walking [ ] ; Pain in feet with lying flat [ ] ; Non-healing sores [ ] ; Stroke [ ] ; TIA [ ] ; Slurred speech [ ] ;  Neuro: Headaches[ ] ; Vertigo[ ] ; Seizures[ ] ; Paresthesias[ ] ;Blurred vision [ ] ; Diplopia [ ] ; Vision changes [ ]   Ortho/Skin: Arthritis [ ] ; Joint pain [ ] ; Muscle pain [ ] ; Joint swelling [ ] ; Back Pain [ ] ; Rash [ ]   Psych: Depression[ ] ; Anxiety[y ]  Heme: Bleeding problems [ ] ; Clotting disorders [ ] ; Anemia [ ]   Endocrine: Diabetes [ ] ; Thyroid dysfunction[ ]   Home Medications Prior to Admission medications   Medication Sig Start Date End Date Taking? Authorizing Provider  acetaminophen (TYLENOL) 325 MG tablet Take 650 mg by mouth every 6 (six) hours as needed for pain.   Yes Historical Provider, MD  amiodarone (PACERONE) 200 MG tablet Take 0.5 tablets (100 mg total) by mouth daily. 02/01/13  Yes Bevelyn Buckles Keijuan Schellhase, MD  amLODipine (NORVASC) 5 MG tablet Take 2 tablets (10 mg total) by mouth daily. 01/24/13  Yes Dolores Patty, MD  aspirin EC 325 MG EC tablet Take 1 tablet (325 mg total) by mouth daily. 01/18/13  Yes Aundria Rud, NP  cephALEXin (KEFLEX) 500 MG capsule Take 500 mg by mouth 4 (  four) times daily. Start date 02/04/13; Duration unknown to pt   Yes Historical Provider, MD  citalopram (CELEXA) 20 MG tablet Take 1 tablet (20 mg total) by mouth daily. 01/18/13  Yes Aundria Rud, NP  digoxin (LANOXIN) 0.125 MG tablet Take 0.5 tablets (0.0625 mg total) by mouth daily. 01/18/13  Yes Aundria Rud, NP  ferrous fumarate-b12-vitamic C-folic acid (TRINSICON / FOLTRIN) capsule Take 1 capsule by mouth 3 (three) times daily after meals. 01/18/13  Yes Aundria Rud, NP  hydrALAZINE (APRESOLINE) 25 MG tablet Take 1 tablet (25 mg total) by mouth 3 (three) times daily. 02/07/13  Yes Laurey Morale, MD  metoprolol succinate (TOPROL-XL) 25 MG 24 hr tablet Take  25 mg by mouth 2 (two) times daily. 01/31/13  Yes Historical Provider, MD  pantoprazole (PROTONIX) 40 MG tablet Take 1 tablet (40 mg total) by mouth daily. 01/18/13  Yes Aundria Rud, NP  potassium chloride (K-DUR,KLOR-CON) 10 MEQ tablet Take 30 meq daily 02/07/13  Yes Laurey Morale, MD  sildenafil (REVATIO) 20 MG tablet Take 40 mg by mouth 3 (three) times daily.   Yes Historical Provider, MD  simvastatin (ZOCOR) 10 MG tablet Take 1 tablet (10 mg total) by mouth at bedtime. 01/18/13  Yes Aundria Rud, NP  spironolactone (ALDACTONE) 25 MG tablet Take 1 tablet (25 mg total) by mouth daily. 01/18/13  Yes Aundria Rud, NP  torsemide (DEMADEX) 20 MG tablet Take 1 tablet (20 mg total) by mouth daily. 02/07/13  Yes Laurey Morale, MD  warfarin (COUMADIN) 2.5 MG tablet Take 1.25 mg by mouth every morning.   Yes Historical Provider, MD  zolpidem (AMBIEN) 5 MG tablet Take 1 tablet (5 mg total) by mouth at bedtime as needed for sleep. 02/01/13  Yes Dolores Patty, MD    Past Medical History: Past Medical History  Diagnosis Date  . Paroxysmal supraventricular tachycardia   . Hypokalemia   . Chronic systolic heart failure     a. 12/1189 Echo: EF 15%, mod to sev antlat and inflat HK, inf AK, mild to mod MR, mildly/mod reduced RV fxn, Mod TR, PASP .  [name] History of DVT (deep vein thrombosis)   . Dyslipidemia   . HTN (hypertension)   . Nonischemic cardiomyopathy     a. 06/2012 Echo: EF 15%;  b. 06/2012 Cath: nl except luminal irregs in LAD.  [name] Ventricular tachycardia     a. s/p ICD  . Goiter 2001  . Implantable cardiac defibrillator- Biotronik 2009    Single chamber  . [name] (left bundle branch block)     intermittent  . Palliative care patient     Past Surgical History: Past Surgical History  Procedure Laterality Date  . None    . Cardiac defibrillator placement  01/2009  . Insertion of implantable left ventricular assist device N/A 12/18/2012    Procedure: INSERTION OF IMPLANTABLE LEFT  VENTRICULAR ASSIST DEVICE;  Surgeon: Kerin Perna, MD;  Location: [facility] OR;  Service: Open Heart Surgery;  Laterality: N/A;  . Intraoperative transesophageal echocardiogram N/A 12/18/2012    Procedure: INTRAOPERATIVE TRANSESOPHAGEAL ECHOCARDIOGRAM;  Surgeon: Kerin Perna, MD;  Location: [facility] OR;  Service: Open Heart Surgery;  Laterality: N/A;  . Tricuspid valve replacement N/A 12/18/2012    Procedure: TRICUSPID VALVE REPAIR;  Surgeon: Kerin Perna, MD;  Location: [facility] OR;  Service: Open Heart Surgery;  Laterality: N/A;  . Insertion of implantable left ventricular assist device N/A 12/18/2012    Procedure:  INSERTION OF IMPLANTABLE RIGHT VENTRICULAR ASSIST DEVICE;  Surgeon: Kerin Perna, MD;  Location: [facility] OR;  Service: Open Heart Surgery;  Laterality: N/A;  . Removal of centrimag ventricular assist device N/A 12/25/2012    Procedure: REMOVAL OF CENTRIMAG VENTRICULAR ASSIST DEVICE;  Surgeon: Kerin Perna, MD;  Location: [facility] OR;  Service: Open Heart Surgery;  Laterality: N/A;  PUMP STANDBY  . Intraoperative transesophageal echocardiogram N/A 12/25/2012    Procedure: INTRAOPERATIVE TRANSESOPHAGEAL ECHOCARDIOGRAM;  Surgeon: Kerin Perna, MD;  Location: [facility] OR;  Service: Open Heart Surgery;  Laterality: N/A;  . Esophagogastroduodenoscopy N/A 12/26/2012    Procedure: ESOPHAGOGASTRODUODENOSCOPY (EGD);  Surgeon: Beverley Fiedler, MD;  Location: [facility] ENDOSCOPY;  Service: Gastroenterology;  Laterality: N/A;  Bedside  . Sternal closure N/A 12/27/2012    Procedure: STERNAL CLOSURE;  Surgeon: Kerin Perna, MD;  Location: [facility] OR;  Service: Thoracic;  Laterality: N/A;  . Mediastinal exploration N/A 12/27/2012    Procedure: MEDIASTINAL EXPLORATION;  Surgeon: Kerin Perna, MD;  Location: [facility] OR;  Service: Thoracic;  Laterality: N/A;    Family History: Family History  Problem Relation Age of Onset  . Emphysema Mother   . Heart disease Mother   . Heart attack Father   . Emphysema Brother     Social  History: History   Social History  . Marital Status: Single    Spouse Name: N/A    Number of Children: N/A  . Years of Education: N/A   Occupational History  . 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 Smoking status: Never Smoker   . Smokeless tobacco: None  . Alcohol Use: No  . Drug Use: No  . Sexual Activity: None   Other Topics Concern  . 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 Lives in [place], not married. 2 daughters/           Allergies:  No Known Allergies  Objective:    Vital Signs:   Temp:  [98.8 F (37.1 C)] 98.8 F (37.1 C) (10/12 2021) Pulse Rate:  [93-101] 96 (10/12 2100) Resp:  [12-31] 23 (10/12 2100) SpO2:  [98 %-99 %] 98 % (10/12 2100) Weight:  [71.215 kg (157 lb)] 71.215 kg (157 lb) (10/12 2021)   Filed Weights   02/11/13 2021  Weight: 71.215 kg (157 lb)   MAP 82-90  Physical Exam: GENERAL: Fatigued appearing. No acute distress HEENT: normal  NECK: Supple, JVP jaw No lymphadenopathy or thyromegaly appreciated.  CARDIAC: +RV lift  LVAD hum present.  LUNGS: Dull at bases  Clear to auscultation bilaterally.  ABDOMEN: Soft, round, nontender, positive bowel sounds x4.  LVAD exit site: well-healed and incorporated. Dressing dry and intact. No erythema or drainage. Stabilization device present and accurately applied. Driveline dressing is being changed daily per sterile technique.  EXTREMITIES: Warm and dry, no cyanosis, clubbing, rash or edema  NEUROLOGIC: Alert and oriented x 4. Gait steady. No aphasia. No dysarthria. Affect pleasant.    Telemetry:  90-100 1AVB  Labs: Basic Metabolic Panel:  Recent Labs Lab 02/07/13 1605  NA 140  K 4.3  CL 103  CO2 20  GLUCOSE 114*  BUN 19  CREATININE 1.62*  CALCIUM 9.8    Liver Function Tests: No results found for this basename: AST, ALT, ALKPHOS, BILITOT, PROT, ALBUMIN,  in the last 168 hours No results found for this  basename: LIPASE, AMYLASE,  in the last 168 hours  No results found for this basename: AMMONIA,  in the last 168 hours  CBC:  Recent Labs Lab 02/07/13 1605 02/11/13 2049  WBC 13.2* 12.9*  HGB 11.0* 11.4*  HCT 35.1* 35.1*  MCV 88.9 86.2  PLT 342 339    Cardiac Enzymes: No results found for this basename: CKTOTAL, CKMB, CKMBINDEX, TROPONINI,  in the last 168 hours  BNP: BNP (last 3 results)  Recent Labs  12/19/12 0314 02/03/13 1138 02/07/13 1600  PROBNP 1470.0* 1382.0* 1845.0*    CBG: No results found for this basename: GLUCAP,  in the last 168 hours  Coagulation Studies: No results found for this basename: LABPROT, INR,  in the last 72 hours  Other results: EKG: SR 90 with chronic [name]  Imaging: Dg Chest Portable 1 View  02/11/2013   CLINICAL DATA:  Shortness of breath.  EXAM: PORTABLE CHEST - 1 VIEW  COMPARISON:  01/18/2013.  FINDINGS: Cardiomegaly. Left ventricular assist device in apparent good position, unchanged. Single lead transvenous AICD again noted. Previous valvuloplasty. Moderate vascular congestion. Small bilateral pleural effusions are stable.  IMPRESSION: Slight worsening aeration, suspect early congestive failure.   Electronically Signed   By: Davonna Belling M.D.   On: 02/11/2013 21:14        Assessment:   1. Acute on chronic systolic HF with biventricular HF EF 15%  --S/p HM II VAD with TV repair 12/18/12  --s/p RVAD. - explanted 8/25  2. 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 VT s/p Biotronik ICD on amio  4. Chronic renal failure, stage III  5. Severe TR s/p TV repair    Plan/Discussion:    I suspect the main issue is recurrent RV failure. She does have mild edema on CXR so will give one does IV lasix in ER. Await labs. If no lab abnormalities to explain her clinical worsening will need RHC and possible re-initiation of milrinone. Pharmacy to manage coumadin.   Yogi Arther,MD 9:20 PM  Length of Stay: 0 Advanced Heart Failure Team Pager  [phone] (M-F; 7a - 4p)  Please contact  Cardiology for night-coverage after hours (4p -7a ) and weekends on [email or web address]

## 2013-02-11 NOTE — ED Notes (Signed)
Pt admitted to 2S bed 7, report given to receiving RN by Dr. Gala Romney, pt transported with baby monitor via stretcher by Dr. Teressa Lower and April Pugh RR RN, pt hooked back up to her Lav[facility] home machine for transport

## 2013-02-11 NOTE — ED Provider Notes (Signed)
CSN: [id number]     Arrival date & time 02/11/13  2008 History   First MD Initiated Contact with Patient 02/11/13 2017     Chief Complaint  Patient presents with  . Shortness of Breath   (Consider location/radiation/quality/duration/timing/severity/associated sxs/prior Treatment) Patient is a 67 y.o. female presenting with shortness of breath. The history is provided by the patient.  Shortness of Breath Severity:  Mild Onset quality:  Sudden Duration:  1 day Timing:  Constant Progression:  Unchanged Chronicity:  New Context: activity   Relieved by:  Nothing Worsened by:  Nothing tried Ineffective treatments:  None tried Associated symptoms: cough   Associated symptoms: no abdominal pain, no chest pain, no fever, no headaches, no neck pain and no vomiting     Past Medical History  Diagnosis Date  . Paroxysmal supraventricular tachycardia   . Hypokalemia   . Chronic systolic heart failure     a. 08/979 Echo: EF 15%, mod to sev antlat and inflat HK, inf AK, mild to mod MR, mildly/mod reduced RV fxn, Mod TR, PASP .  [name] History of DVT (deep vein thrombosis)   . Dyslipidemia   . HTN (hypertension)   . Nonischemic cardiomyopathy     a. 06/2012 Echo: EF 15%;  b. 06/2012 Cath: nl except luminal irregs in LAD.  [name] Ventricular tachycardia     a. s/p ICD  . Goiter 2001  . Implantable cardiac defibrillator- Biotronik 2009    Single chamber  . [name] (left bundle branch block)     intermittent  . Palliative care patient    Past Surgical History  Procedure Laterality Date  . None    . Cardiac defibrillator placement  01/2009  . Insertion of implantable left ventricular assist device N/A 12/18/2012    Procedure: INSERTION OF IMPLANTABLE LEFT VENTRICULAR ASSIST DEVICE;  Surgeon: Kerin Perna, MD;  Location: [facility] OR;  Service: Open Heart Surgery;  Laterality: N/A;  . Intraoperative transesophageal echocardiogram N/A 12/18/2012    Procedure: INTRAOPERATIVE TRANSESOPHAGEAL  ECHOCARDIOGRAM;  Surgeon: Kerin Perna, MD;  Location: [facility] OR;  Service: Open Heart Surgery;  Laterality: N/A;  . Tricuspid valve replacement N/A 12/18/2012    Procedure: TRICUSPID VALVE REPAIR;  Surgeon: Kerin Perna, MD;  Location: [facility] OR;  Service: Open Heart Surgery;  Laterality: N/A;  . Insertion of implantable left ventricular assist device N/A 12/18/2012    Procedure: INSERTION OF IMPLANTABLE RIGHT VENTRICULAR ASSIST DEVICE;  Surgeon: Kerin Perna, MD;  Location: [facility] OR;  Service: Open Heart Surgery;  Laterality: N/A;  . Removal of centrimag ventricular assist device N/A 12/25/2012    Procedure: REMOVAL OF CENTRIMAG VENTRICULAR ASSIST DEVICE;  Surgeon: Kerin Perna, MD;  Location: [facility] OR;  Service: Open Heart Surgery;  Laterality: N/A;  PUMP STANDBY  . Intraoperative transesophageal echocardiogram N/A 12/25/2012    Procedure: INTRAOPERATIVE TRANSESOPHAGEAL ECHOCARDIOGRAM;  Surgeon: Kerin Perna, MD;  Location: [facility] OR;  Service: Open Heart Surgery;  Laterality: N/A;  . Esophagogastroduodenoscopy N/A 12/26/2012    Procedure: ESOPHAGOGASTRODUODENOSCOPY (EGD);  Surgeon: Beverley Fiedler, MD;  Location: [facility] ENDOSCOPY;  Service: Gastroenterology;  Laterality: N/A;  Bedside  . Sternal closure N/A 12/27/2012    Procedure: STERNAL CLOSURE;  Surgeon: Kerin Perna, MD;  Location: [facility] OR;  Service: Thoracic;  Laterality: N/A;  . Mediastinal exploration N/A 12/27/2012    Procedure: MEDIASTINAL EXPLORATION;  Surgeon: Kerin Perna, MD;  Location: [facility] OR;  Service: Thoracic;  Laterality: N/A;   Family History  Problem  Relation Age of Onset  . Emphysema Mother   . Heart disease Mother   . Heart attack Father   . Emphysema Brother    History  Substance Use Topics  . Smoking status: Never Smoker   . Smokeless tobacco: Not on file  . Alcohol Use: No   OB History   Grav Para Term Preterm Abortions TAB SAB Ect Mult Living                 Review of Systems  Constitutional: Negative for fever and  fatigue.  HENT: Negative for congestion and drooling.   Eyes: Negative for pain.  Respiratory: Positive for cough, chest tightness and shortness of breath.   Cardiovascular: Negative for chest pain.  Gastrointestinal: Negative for nausea, vomiting, abdominal pain and diarrhea.  Genitourinary: Negative for dysuria and hematuria.  Musculoskeletal: Negative for back pain, gait problem and neck pain.  Skin: Negative for color change.  Neurological: Negative for dizziness and headaches.  Hematological: Negative for adenopathy.  Psychiatric/Behavioral: Negative for behavioral problems.  All other systems reviewed and are negative.    Allergies  Review of patient's allergies indicates no known allergies.  Home Medications   Current Outpatient Rx  Name  Route  Sig  Dispense  Refill  . metoprolol succinate (TOPROL-XL) 25 MG 24 hr tablet   Oral   Take 25 mg by mouth 2 (two) times daily.         [name] acetaminophen (TYLENOL) 325 MG tablet   Oral   Take 650 mg by mouth every 6 (six) hours as needed for pain.         [name] amiodarone (PACERONE) 200 MG tablet   Oral   Take 0.5 tablets (100 mg total) by mouth daily.   30 tablet   6   . amLODipine (NORVASC) 5 MG tablet   Oral   Take 2 tablets (10 mg total) by mouth daily.   60 tablet   6   . aspirin EC 325 MG EC tablet   Oral   Take 1 tablet (325 mg total) by mouth daily.   30 tablet   0   . cephALEXin (KEFLEX) 500 MG capsule   Oral   Take 1 capsule (500 mg total) by mouth 3 (three) times daily.   21 capsule   0   . citalopram (CELEXA) 20 MG tablet   Oral   Take 1 tablet (20 mg total) by mouth daily.   30 tablet   6   . digoxin (LANOXIN) 0.125 MG tablet   Oral   Take 0.5 tablets (0.0625 mg total) by mouth daily.   30 tablet   6   . ferrous fumarate-b12-vitamic C-folic acid (TRINSICON / FOLTRIN) capsule   Oral   Take 1 capsule by mouth 3 (three) times daily after meals.   90 capsule   6   . furosemide (LASIX) 40 MG  tablet   Oral   Take 40 mg by mouth daily.         . hydrALAZINE (APRESOLINE) 25 MG tablet   Oral   Take 1 tablet (25 mg total) by mouth 3 (three) times daily.   90 tablet   6     Dose change, please file. Pt currently has medicat ...   . pantoprazole (PROTONIX) 40 MG tablet   Oral   Take 1 tablet (40 mg total) by mouth daily.   30 tablet   6   . potassium chloride (K-DUR,KLOR-CON) 10 MEQ tablet  Take 30 meq daily   90 tablet   3     Please file dose change; pt has medication current ...   . sildenafil (REVATIO) 20 MG tablet      TAKE 2 TABLETS (40 MG TOTAL) BY MOUTH 3 (THREE) TIMES DAILY.   180 tablet   3   . simvastatin (ZOCOR) 10 MG tablet   Oral   Take 1 tablet (10 mg total) by mouth at bedtime.   90 tablet   3   . spironolactone (ALDACTONE) 25 MG tablet   Oral   Take 1 tablet (25 mg total) by mouth daily.   30 tablet   6   . torsemide (DEMADEX) 20 MG tablet   Oral   Take 1 tablet (20 mg total) by mouth daily.   30 tablet   6   . warfarin (COUMADIN) 2.5 MG tablet   Oral   Take 1 tablet (2.5 mg total) by mouth daily. Take one tab daily or as directed   90 tablet   2   . warfarin (COUMADIN) 5 MG tablet   Oral   Take 0.5 tablets (2.5 mg total) by mouth daily.   30 tablet   6     Hold coumadin today. On Monday 10/6 take 1.25 mg ( ...   . zolpidem (AMBIEN) 5 MG tablet   Oral   Take 1 tablet (5 mg total) by mouth at bedtime as needed for sleep.   30 tablet   2    BP   Pulse 101  Temp(Src) 98.8 F (37.1 C) (Oral)  Resp 20  Ht 5\' 3"  (1.6 m)  Wt 157 lb (71.215 kg)  BMI 27.82 kg/m2  SpO2 98% Physical Exam  Nursing note and vitals reviewed. Constitutional: She is oriented to person, place, and time. She appears well-developed and well-nourished.  HENT:  Head: Normocephalic.  Mouth/Throat: Oropharynx is clear and moist. No oropharyngeal exudate.  Eyes: Conjunctivae and EOM are normal. Pupils are equal, round, and reactive to light.   Neck: Normal range of motion. Neck supple.  Cardiovascular: Exam reveals no gallop and no friction rub.   No murmur heard. Mechanical heart sounds c/w LVAD. No regular pulse.   Pulmonary/Chest: Effort normal and breath sounds normal. No respiratory distress. She has no wheezes.  Abdominal: Soft. Bowel sounds are normal. There is no tenderness. There is no rebound and no guarding.  Musculoskeletal: Normal range of motion. She exhibits no edema and no tenderness.  Neurological: She is alert and oriented to person, place, and time.  Skin: Skin is warm and dry.  Psychiatric: She has a normal mood and affect. Her behavior is normal.    ED Course  Procedures (including critical care time) Labs Review Labs Reviewed  CBC - Abnormal; Notable for the following:    WBC 12.9 (*)    Hemoglobin 11.4 (*)    HCT 35.1 (*)    RDW 16.4 (*)    All other components within normal limits  COMPREHENSIVE METABOLIC PANEL - Abnormal; Notable for the following:    Sodium 134 (*)    CO2 17 (*)    Glucose, Bld 140 (*)    Creatinine, Ser 1.32 (*)    GFR calc non Af Amer 41 (*)    GFR calc Af Amer 48 (*)    All other components within normal limits  PROTIME-INR - Abnormal; Notable for the following:    Prothrombin Time 21.5 (*)    INR 1.93 (*)  All other components within normal limits  URINALYSIS, ROUTINE W REFLEX MICROSCOPIC - Abnormal; Notable for the following:    APPearance CLOUDY (*)    Protein, ur 30 (*)    Leukocytes, UA TRACE (*)    All other components within normal limits  PRO B NATRIURETIC PEPTIDE - Abnormal; Notable for the following:    Pro B Natriuretic peptide (BNP) 1947.0 (*)    All other components within normal limits  LACTATE DEHYDROGENASE - Abnormal; Notable for the following:    LDH 310 (*)    All other components within normal limits  URINE MICROSCOPIC-ADD ON - Abnormal; Notable for the following:    Squamous Epithelial / LPF FEW (*)    Bacteria, UA FEW (*)    Casts HYALINE  CASTS (*)    All other components within normal limits  DIGOXIN LEVEL - Abnormal; Notable for the following:    Digoxin Level 0.5 (*)    All other components within normal limits  BASIC METABOLIC PANEL - Abnormal; Notable for the following:    Creatinine, Ser 1.30 (*)    GFR calc non Af Amer 42 (*)    GFR calc Af Amer 49 (*)    All other components within normal limits  CBC - Abnormal; Notable for the following:    RBC 3.80 (*)    Hemoglobin 10.5 (*)    HCT 33.0 (*)    RDW 16.5 (*)    All other components within normal limits  PROTIME-INR - Abnormal; Notable for the following:    Prothrombin Time 22.7 (*)    INR 2.08 (*)    All other components within normal limits  URINE CULTURE   Imaging Review Dg Chest Portable 1 View  02/11/2013   CLINICAL DATA:  Shortness of breath.  EXAM: PORTABLE CHEST - 1 VIEW  COMPARISON:  01/18/2013.  FINDINGS: Cardiomegaly. Left ventricular assist device in apparent good position, unchanged. Single lead transvenous AICD again noted. Previous valvuloplasty. Moderate vascular congestion. Small bilateral pleural effusions are stable.  IMPRESSION: Slight worsening aeration, suspect early congestive failure.   Electronically Signed   By: Davonna Belling M.D.   On: 02/11/2013 21:14    Date: 02/11/2013  Rate: 101  Rhythm: sinus tachycardia  QRS Axis: left  Intervals: indeterminate  ST/T Wave abnormalities: nonspecific ST/T changes  Conduction Disutrbances:none  Narrative Interpretation: mild artifact limiting interpretation, no significant change from previous  Old EKG Reviewed: unchanged    MDM   1. Acute on chronic systolic CHF (congestive heart failure)   2. LVAD (left ventricular assist device) present   3. RVF (right ventricular failure)    8:38 PM 67 y.o. female with a history of LVAD placement within the last 2 months who presents with shortness of breath which began yesterday evening. She notes feeling a squeezing sensation in her lower chest  which she has had previously but never with ongoing shortness of breath. She notes that she has a chronic cough which is unchanged. She denies any fevers, vomiting, diarrhea. She is afebrile and vital signs are unremarkable here. Dr. Elray Mcgregor who follows the patient has been contacted. She is currently stable on exam.  Will plan on admission.     Junius Argyle, MD 02/12/13 1136

## 2013-02-11 NOTE — ED Notes (Signed)
Per [facility], pt reports feeling SOB and epigastric tightness starting last night. Pt's O2 sats 93 % ra, pt placed on 2L O2 via [place]. Pt a/o x4. April RR RN at bedside upon pt's arrival

## 2013-02-11 NOTE — ED Notes (Signed)
MD Harrison at bedside. 

## 2013-02-11 NOTE — Progress Notes (Signed)
ANTICOAGULATION CONSULT NOTE - Initial Consult  Pharmacy Consult for Coumadin Indication: LVAD  No Known Allergies  Patient Measurements: Height: 5\' 3"  (160 cm) Weight: 157 lb 4.8 oz (71.351 kg) IBW/kg (Calculated) : 52.4  Vital Signs: Temp: 98.8 F (37.1 C) (10/12 2021) Temp src: Oral (10/12 2021) BP: 82/0 mmHg (10/12 2126) Pulse Rate: 92 (10/12 2115)  Labs:  Recent Labs  02/11/13 2049  HGB 11.4*  HCT 35.1*  PLT 339  LABPROT 21.5*  INR 1.93*  CREATININE 1.32*    Estimated Creatinine Clearance: 40.2 ml/min (by C-G formula based on Cr of 1.32).   Medical History: Past Medical History  Diagnosis Date  . Paroxysmal supraventricular tachycardia   . Hypokalemia   . Chronic systolic heart failure     a. 05/6107 Echo: EF 15%, mod to sev antlat and inflat HK, inf AK, mild to mod MR, mildly/mod reduced RV fxn, Mod TR, PASP .  [name] History of DVT (deep vein thrombosis)   . Dyslipidemia   . HTN (hypertension)   . Nonischemic cardiomyopathy     a. 06/2012 Echo: EF 15%;  b. 06/2012 Cath: nl except luminal irregs in LAD.  [name] Ventricular tachycardia     a. s/p ICD  . Goiter 2001  . Implantable cardiac defibrillator- Biotronik 2009    Single chamber  . [name] (left bundle branch block)     intermittent  . Palliative care patient     Medications:  Prescriptions prior to admission  Medication Sig Dispense Refill  . acetaminophen (TYLENOL) 325 MG tablet Take 650 mg by mouth every 6 (six) hours as needed for pain.      [name] amiodarone (PACERONE) 200 MG tablet Take 0.5 tablets (100 mg total) by mouth daily.  30 tablet  6  . amLODipine (NORVASC) 5 MG tablet Take 2 tablets (10 mg total) by mouth daily.  60 tablet  6  . aspirin EC 325 MG EC tablet Take 1 tablet (325 mg total) by mouth daily.  30 tablet  0  . cephALEXin (KEFLEX) 500 MG capsule Take 500 mg by mouth 4 (four) times daily. Start date 02/04/13; Duration unknown to pt      . citalopram (CELEXA) 20 MG tablet Take 1 tablet  (20 mg total) by mouth daily.  30 tablet  6  . digoxin (LANOXIN) 0.125 MG tablet Take 0.5 tablets (0.0625 mg total) by mouth daily.  30 tablet  6  . ferrous fumarate-b12-vitamic C-folic acid (TRINSICON / FOLTRIN) capsule Take 1 capsule by mouth 3 (three) times daily after meals.  90 capsule  6  . hydrALAZINE (APRESOLINE) 25 MG tablet Take 1 tablet (25 mg total) by mouth 3 (three) times daily.  90 tablet  6  . metoprolol succinate (TOPROL-XL) 25 MG 24 hr tablet Take 25 mg by mouth 2 (two) times daily.      . pantoprazole (PROTONIX) 40 MG tablet Take 1 tablet (40 mg total) by mouth daily.  30 tablet  6  . potassium chloride (K-DUR,KLOR-CON) 10 MEQ tablet Take 30 meq daily  90 tablet  3  . sildenafil (REVATIO) 20 MG tablet Take 40 mg by mouth 3 (three) times daily.      . simvastatin (ZOCOR) 10 MG tablet Take 1 tablet (10 mg total) by mouth at bedtime.  90 tablet  3  . spironolactone (ALDACTONE) 25 MG tablet Take 1 tablet (25 mg total) by mouth daily.  30 tablet  6  . torsemide (DEMADEX) 20 MG tablet Take 1  tablet (20 mg total) by mouth daily.  30 tablet  6  . warfarin (COUMADIN) 2.5 MG tablet Take 1.25 mg by mouth every morning.      . zolpidem (AMBIEN) 5 MG tablet Take 1 tablet (5 mg total) by mouth at bedtime as needed for sleep.  30 tablet  2   Scheduled:  . [START ON 02/12/2013] amiodarone  100 mg Oral Daily  . [START ON 02/12/2013] amLODipine  10 mg Oral Daily  . [START ON 02/12/2013] aspirin  81 mg Oral Pre-Cath  . [START ON 02/12/2013] aspirin EC  325 mg Oral Daily  . [START ON 02/12/2013] citalopram  20 mg Oral Daily  . [START ON 02/12/2013] digoxin  0.0625 mg Oral Daily  . hydrALAZINE  25 mg Oral TID  . [START ON 02/12/2013] pantoprazole  40 mg Oral Daily  . [START ON 02/12/2013] potassium chloride  20 mEq Oral Daily  . sildenafil  40 mg Oral TID  . simvastatin  10 mg Oral QHS  . sodium chloride  3 mL Intravenous Q12H  . sodium chloride  3 mL Intravenous Q12H  . sodium chloride  3  mL Intravenous Q12H  . [START ON 02/12/2013] spironolactone  25 mg Oral Daily  . [START ON 02/12/2013] torsemide  20 mg Oral Daily  . warfarin  1 mg Oral NOW  . [START ON 02/12/2013] Warfarin - Pharmacist Dosing Inpatient   Does not apply q1800    Assessment: 67yo female w/ complicated LVAD procedure in August that required temporary RVAD support now presents w/ progressive weakness w/ increased dyspnea on exertion, unable to walk to bathroom, admitted for further evaluation and possible reinitiation of milrinone, to continue Coumadin; admitted w/ slightly subtherapeutic INR.  Goal of Therapy:  INR 2-3   Plan:  Will give additional 1mg  of Coumadin tonight in addition to dose pt took at home and monitor INR for dose adjustments.  Vernard Gambles, PharmD, BCPS  02/11/2013,10:07 PM

## 2013-02-11 NOTE — ED Notes (Signed)
Dr. Bensimhon at bedside  

## 2013-02-11 NOTE — ED Notes (Signed)
XR at bedside

## 2013-02-11 NOTE — ED Notes (Signed)
Pt c/o ongoing numbness feeling at surgical site, pt reports a sudden onset of SOB that increases with exertion and epigastric tightness starting last night. Pt also reports an ongoing dry cough. Pt alert and oriented x4. Pt describes her epigastric pain as a squeezing feeling.

## 2013-02-12 ENCOUNTER — Encounter (HOSPITAL_COMMUNITY)
Admission: EM | Disposition: A | Discharge status: Home / Self Care | Payer: Self-pay | Source: Home / Self Care | Attending: Internal Medicine

## 2013-02-12 DIAGNOSIS — I509 Heart failure, unspecified: Secondary | ICD-10-CM

## 2013-02-12 HISTORY — PX: RIGHT HEART CATHETERIZATION: SHX5447

## 2013-02-12 LAB — POCT I-STAT 3, ART BLOOD GAS (G3+)
Acid-base deficit: 2 mmol/L (ref 0.0–2.0)
Acid-base deficit: 3 mmol/L — ABNORMAL HIGH (ref 0.0–2.0)
Acid-base deficit: 3 mmol/L — ABNORMAL HIGH (ref 0.0–2.0)
Bicarbonate: 22 mEq/L (ref 20.0–24.0)
O2 Saturation: 66 %
TCO2: 22 mmol/L (ref 0–100)
pCO2 arterial: 38 mmHg (ref 35.0–45.0)
pH, Arterial: 7.371 (ref 7.350–7.450)
pH, Arterial: 7.406 (ref 7.350–7.450)
pO2, Arterial: 37 mmHg — CL (ref 80.0–100.0)

## 2013-02-12 LAB — BASIC METABOLIC PANEL
Calcium: 9.5 mg/dL (ref 8.4–10.5)
GFR calc Af Amer: 49 mL/min — ABNORMAL LOW (ref >90)
GFR calc non Af Amer: 42 mL/min — ABNORMAL LOW (ref >90)
Glucose, Bld: 96 mg/dL (ref 70–99)
Potassium: 3.8 mEq/L (ref 3.5–5.1)
Sodium: 135 mEq/L (ref 135–145)

## 2013-02-12 LAB — CBC
Hemoglobin: 10.5 g/dL — ABNORMAL LOW (ref 12.0–15.0)
MCV: 86.8 fL (ref 78.0–100.0)
Platelets: 292 10*3/uL (ref 150–400)
RBC: 3.8 MIL/uL — ABNORMAL LOW (ref 3.87–5.11)
RDW: 16.5 % — ABNORMAL HIGH (ref 11.5–15.5)
WBC: 9.9 10*3/uL (ref 4.0–10.5)

## 2013-02-12 LAB — POCT I-STAT 3, VENOUS BLOOD GAS (G3P V)
Bicarbonate: 24.1 mEq/L — ABNORMAL HIGH (ref 20.0–24.0)
O2 Saturation: 76 %
TCO2: 25 mmol/L (ref 0–100)
pCO2, Ven: 37.9 mmHg — ABNORMAL LOW (ref 45.0–50.0)
pH, Ven: 7.411 — ABNORMAL HIGH (ref 7.250–7.300)
pO2, Ven: 41 mmHg (ref 30.0–45.0)

## 2013-02-12 LAB — PROTIME-INR
INR: 2.08 — ABNORMAL HIGH (ref 0.00–1.49)
Prothrombin Time: 22.7 seconds — ABNORMAL HIGH (ref 11.6–15.2)

## 2013-02-12 SURGERY — RIGHT HEART CATH
Anesthesia: LOCAL

## 2013-02-12 MED ORDER — LIDOCAINE-EPINEPHRINE 1 %-1:100000 IJ SOLN
INTRAMUSCULAR | Status: AC
  Filled 2013-02-12: qty 1

## 2013-02-12 MED ORDER — HEPARIN (PORCINE) IN NACL 2-0.9 UNIT/ML-% IJ SOLN
INTRAMUSCULAR | Status: AC
  Filled 2013-02-12: qty 500

## 2013-02-12 MED ORDER — SODIUM CHLORIDE 0.9 % IJ SOLN: 3.0000 mL | INTRAMUSCULAR | Status: DC | PRN

## 2013-02-12 MED ORDER — ONDANSETRON HCL 4 MG/2ML IJ SOLN: 4.0000 mg | Freq: Four times a day (QID) | INTRAMUSCULAR | Status: DC | PRN

## 2013-02-12 MED ORDER — WARFARIN SODIUM 2.5 MG PO TABS
2.5000 mg | ORAL_TABLET | Freq: Once | ORAL | Status: AC
  Administered 2013-02-12: 2.5 mg via ORAL
  Filled 2013-02-12: qty 1

## 2013-02-12 MED ORDER — SODIUM CHLORIDE 0.9 % IJ SOLN: 3.0000 mL | Freq: Two times a day (BID) | INTRAMUSCULAR | Status: DC

## 2013-02-12 MED ORDER — MIDAZOLAM HCL 2 MG/2ML IJ SOLN
INTRAMUSCULAR | Status: AC
  Filled 2013-02-12: qty 2

## 2013-02-12 MED ORDER — SODIUM CHLORIDE 0.9 % IV SOLN: 250.0000 mL | INTRAVENOUS | Status: DC | PRN

## 2013-02-12 MED ORDER — ACETAMINOPHEN 325 MG PO TABS
650.0000 mg | ORAL_TABLET | ORAL | Status: DC | PRN
  Administered 2013-02-13: 650 mg via ORAL
  Filled 2013-02-12: qty 2

## 2013-02-12 MED ORDER — LIDOCAINE HCL (PF) 1 % IJ SOLN
INTRAMUSCULAR | Status: AC
  Filled 2013-02-12: qty 30

## 2013-02-12 NOTE — Progress Notes (Signed)
ANTICOAGULATION CONSULT NOTE - Follow-Up Consult  Pharmacy Consult for Coumadin Indication: LVAD  No Known Allergies  Patient Measurements: Height: 5\' 3"  (160 cm) Weight: 154 lb 5.2 oz (70 kg) IBW/kg (Calculated) : 52.4  Vital Signs: Temp: 98.2 F (36.8 C) (10/13 1203) Temp src: Oral (10/13 1203) Pulse Rate: 82 (10/13 1200)  Labs:  Recent Labs  02/11/13 2049 02/12/13 0330  HGB 11.4* 10.5*  HCT 35.1* 33.0*  PLT 339 292  LABPROT 21.5* 22.7*  INR 1.93* 2.08*  CREATININE 1.32* 1.30*    Estimated Creatinine Clearance: 40.5 ml/min (by C-G formula based on Cr of 1.3).   Medical History: Past Medical History  Diagnosis Date  . Paroxysmal supraventricular tachycardia   . Hypokalemia   . Chronic systolic heart failure     a. 05/6107 Echo: EF 15%, mod to sev antlat and inflat HK, inf AK, mild to mod MR, mildly/mod reduced RV fxn, Mod TR, PASP .  [name] History of DVT (deep vein thrombosis)   . Dyslipidemia   . HTN (hypertension)   . Nonischemic cardiomyopathy     a. 06/2012 Echo: EF 15%;  b. 06/2012 Cath: nl except luminal irregs in LAD.  [name] Ventricular tachycardia     a. s/p ICD  . Goiter 2001  . Implantable cardiac defibrillator- Biotronik 2009    Single chamber  . [name] (left bundle branch block)     intermittent  . Palliative care patient     Medications:  Prescriptions prior to admission  Medication Sig Dispense Refill  . acetaminophen (TYLENOL) 325 MG tablet Take 650 mg by mouth every 6 (six) hours as needed for pain.      [name] amiodarone (PACERONE) 200 MG tablet Take 0.5 tablets (100 mg total) by mouth daily.  30 tablet  6  . amLODipine (NORVASC) 5 MG tablet Take 2 tablets (10 mg total) by mouth daily.  60 tablet  6  . aspirin EC 325 MG EC tablet Take 1 tablet (325 mg total) by mouth daily.  30 tablet  0  . cephALEXin (KEFLEX) 500 MG capsule Take 500 mg by mouth 4 (four) times daily. Start date 02/04/13; Duration unknown to pt      . citalopram (CELEXA) 20 MG  tablet Take 1 tablet (20 mg total) by mouth daily.  30 tablet  6  . digoxin (LANOXIN) 0.125 MG tablet Take 0.5 tablets (0.0625 mg total) by mouth daily.  30 tablet  6  . ferrous fumarate-b12-vitamic C-folic acid (TRINSICON / FOLTRIN) capsule Take 1 capsule by mouth 3 (three) times daily after meals.  90 capsule  6  . hydrALAZINE (APRESOLINE) 25 MG tablet Take 1 tablet (25 mg total) by mouth 3 (three) times daily.  90 tablet  6  . metoprolol succinate (TOPROL-XL) 25 MG 24 hr tablet Take 25 mg by mouth 2 (two) times daily.      . pantoprazole (PROTONIX) 40 MG tablet Take 1 tablet (40 mg total) by mouth daily.  30 tablet  6  . potassium chloride (K-DUR,KLOR-CON) 10 MEQ tablet Take 30 meq daily  90 tablet  3  . sildenafil (REVATIO) 20 MG tablet Take 40 mg by mouth 3 (three) times daily.      . simvastatin (ZOCOR) 10 MG tablet Take 1 tablet (10 mg total) by mouth at bedtime.  90 tablet  3  . spironolactone (ALDACTONE) 25 MG tablet Take 1 tablet (25 mg total) by mouth daily.  30 tablet  6  . torsemide (DEMADEX) 20 MG  tablet Take 1 tablet (20 mg total) by mouth daily.  30 tablet  6  . warfarin (COUMADIN) 2.5 MG tablet Take 2.5 mg by mouth daily with supper.      . zolpidem (AMBIEN) 5 MG tablet Take 1 tablet (5 mg total) by mouth at bedtime as needed for sleep.  30 tablet  2   Scheduled:  . amiodarone  100 mg Oral Daily  . amLODipine  10 mg Oral Daily  . aspirin EC  325 mg Oral Daily  . citalopram  20 mg Oral Daily  . digoxin  0.0625 mg Oral Daily  . hydrALAZINE  25 mg Oral TID  . pantoprazole  40 mg Oral Daily  . potassium chloride  20 mEq Oral Daily  . sildenafil  40 mg Oral TID  . simvastatin  10 mg Oral QHS  . sodium chloride  3 mL Intravenous Q12H  . sodium chloride  3 mL Intravenous Q12H  . spironolactone  25 mg Oral Daily  . torsemide  20 mg Oral Daily  . warfarin  2.5 mg Oral ONCE-1800  . Warfarin - Pharmacist Dosing Inpatient   Does not apply q1800    Assessment: 67yo female w/  complicated LVAD procedure in August that required temporary RVAD support now presents w/ progressive weakness w/ increased dyspnea on exertion, unable to walk to bathroom, admitted for further evaluation and possible reinitiation of milrinone, to continue Coumadin; admitted w/ slightly subtherapeutic INR.  Today's INR therapeutic at 2.08, no bleeding or complications noted.  Coumadin dose PTA: 2.5 mg daily  Goal of Therapy:  INR 2-3   Plan:  Continue Coumadin 2.5 mg po x 1 tonight. Continue daily PT/INR.  Tad Moore, BCPS  Clinical Pharmacist Pager [phone]  02/12/2013 2:12 PM

## 2013-02-12 NOTE — Progress Notes (Signed)
Utilization Review Completed.Kaitlin [name] T10/13/2014  

## 2013-02-12 NOTE — CV Procedure (Signed)
Cardiac Cath Procedure Note:  Indication:  HF  Procedures performed:  1) Right heart catheterization  Description of procedure:   The risks and indication of the procedure were explained. Consent was signed and placed on the chart. An appropriate timeout was taken prior to the procedure. The right neck was prepped and draped in the routine sterile fashion and anesthetized with 1% local lidocaine.   A 7 FR venous sheath was placed in the right internal jugular vein using ultrasound guidance and a modified Seldinger technique. A standard Swan-Ganz catheter was used for the procedure.   Complications: None apparent.  Findings:  RA = 17 RV = 35/2/12 PA = 35/15 (22) PCW = 11 Fick cardiac output/index = 5.4/3.1 Thermo CO/CI =  3.7/2.1 PVR = 2.0 WU (Fick) FA sat = assumed 98%  PA sat = 66%, 70%, 70% RA sat = 76%  Assessment:  Some discrepancy between Fick and Thermo. But overall hemodynamics look good. There is evidence of significant TR but RV output relatively well compensated.  Plan/Discussion:   Continue current therapy. Resume demadex. Can likely go home today or tomorrow.  Kaitlin [name] 8:49 AM    Arvilla Meres 8:41 AM

## 2013-02-12 NOTE — H&P (View-Only) (Signed)
HeartMate 2 Rounding Note  Subjective:    Kaitlin [name] is a 67 y/o woman with h/o HTN, [name], VT s/p Biotronik ICD (2009) and severe systolic 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 EF 15-25% with moderate RV dysfunction   8/18 Underwent HM II LVAD implant (under destination criteria) along with TV repair. Post-op couse c/b by severe RV failure and shock requiring take back to the OR for bleeding and temproray RVAD support.   Was admitted to 10/4-10/08/2012 multiple PI events in setting of restarting b-blocker. Echo showed severe RV dysfunction with recurrent severe TR. PI events resolved with changing controller and stopping b-blocker. Also treated with abx (Keflex) for possible UTI and driveline trauma. Seen in clinic 10/8 and feeling OK. Cr up slightly from 1.4->1.6. Hydralazine titrated due to elevated MAPs. Hgb stable.   Presented to ED yesterday for increased DOE and weakness. Described having a lower chest and epigastrium squeezing sensation. Admitted for suspected recurrent RV failure. Cr stable 1.3. -2L. Weight 154 lbs.   LVAD INTERROGATION:  HeartMate II LVAD:  Flow 4.3 liters/min, speed 9000, power 4.2 , PI 6.1 occasional PI events  Objective:    Vital Signs:   Temp:  [98.2 F (36.8 C)-98.8 F (37.1 C)] 98.2 F (36.8 C) (10/13 0000) Pulse Rate:  [87-101] 88 (10/13 0700) Resp:  [12-31] 18 (10/13 0500) BP: (82)/(0) 82/0 mmHg (10/12 2126) SpO2:  [94 %-99 %] 97 % (10/13 0500) Weight:  [154 lb 5.2 oz (70 kg)-157 lb 4.8 oz (71.351 kg)] 154 lb 5.2 oz (70 kg) (10/13 0500) Last BM Date: 02/11/13 Mean arterial Pressure 70-80s  Intake/Output:   Intake/Output Summary (Last 24 hours) at 02/12/13 0729 Last data filed at 02/12/13 0600  Gross per 24 hour  Intake     60 ml  Output   2250 ml  Net  -2190 ml     Physical Exam: General:  fatigued appearing. No resp difficulty HEENT: normal Neck: supple. JVP to jaw . Carotids 2+ bilat; no bruits. No lymphadenopathy or thryomegaly appreciated. Cor: Mechanical  heart sounds with LVAD hum present. Lungs: clear Abdomen: soft, nontender, nondistended. No hepatosplenomegaly. No bruits or masses. Good bowel sounds. Driveline: C/D/I; securement device intact. No erythema or drainage. Dressing is being changed QOD.  Extremities: no cyanosis, clubbing, rash, edema Neuro: alert & orientedx3, cranial nerves grossly intact. moves all 4 extremities w/o difficulty. Affect pleasant  Telemetry: 90  Labs: Basic Metabolic Panel:  Recent Labs Lab 02/07/13 1605 02/11/13 2049 02/12/13 0330  NA 140 134* 135  K 4.3 3.9 3.8  CL 103 100 100  CO2 20 17* 20  GLUCOSE 114* 140* 96  BUN 19 17 16   CREATININE 1.62* 1.32* 1.30*  CALCIUM 9.8 9.9 9.5    Liver Function Tests:  Recent Labs Lab 02/11/13 2049  AST 20  ALT 18  ALKPHOS 113  BILITOT 1.0  PROT 7.6  ALBUMIN 4.1   No results found for this basename: LIPASE, AMYLASE,  in the last 168 hours No results found for this basename: AMMONIA,  in the last 168 hours  CBC:  Recent Labs Lab 02/07/13 1605 02/11/13 2049 02/12/13 0330  WBC 13.2* 12.9* 9.9  HGB 11.0* 11.4* 10.5*  HCT 35.1* 35.1* 33.0*  MCV 88.9 86.2 86.8  PLT 342 339 292    INR:  Recent Labs Lab 02/07/13 1605 02/11/13 2049 02/12/13 0330  INR 2.22* 1.93* 2.08*    Other results:  EKG:   Imaging: Dg Chest Portable 1 View  02/11/2013   CLINICAL  DATA:  Shortness of breath.  EXAM: PORTABLE CHEST - 1 VIEW  COMPARISON:  01/18/2013.  FINDINGS: Cardiomegaly. Left ventricular assist device in apparent good position, unchanged. Single lead transvenous AICD again noted. Previous valvuloplasty. Moderate vascular congestion. Small bilateral pleural effusions are stable.  IMPRESSION: Slight worsening aeration, suspect early congestive failure.   Electronically Signed   By: Davonna Belling M.D.   On: 02/11/2013 21:14      Medications:     Scheduled Medications: . amiodarone  100 mg Oral Daily  . amLODipine  10 mg Oral Daily  . aspirin  EC  325 mg Oral Daily  . citalopram  20 mg Oral Daily  . digoxin  0.0625 mg Oral Daily  . hydrALAZINE  25 mg Oral TID  . pantoprazole  40 mg Oral Daily  . potassium chloride  20 mEq Oral Daily  . sildenafil  40 mg Oral TID  . simvastatin  10 mg Oral QHS  . sodium chloride  3 mL Intravenous Q12H  . sodium chloride  3 mL Intravenous Q12H  . sodium chloride  3 mL Intravenous Q12H  . spironolactone  25 mg Oral Daily  . torsemide  20 mg Oral Daily  . Warfarin - Pharmacist Dosing Inpatient   Does not apply q1800     Infusions:     PRN Medications:  sodium chloride, sodium chloride, sodium chloride, acetaminophen, sodium chloride, sodium chloride, sodium chloride, zolpidem   Assessment:   1. Acute on chronic systolic HF with biventricular HF EF 15%  --S/p HM II VAD with TV repair 12/18/12  --s/p RVAD. 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 VT s/p Biotronik ICD on amio  4. Chronic renal failure, stage III  5. Severe TR s/p TV repair   Plan/Discussion:    Patient was out of amiodarone and torsemide for multiple days and was admitted for increased SOB. Received 80 mg IV lasix last night and had 2L out. SOB much improved. Going for RHC today to assess RV and CO/CI.   I reviewed the LVAD parameters from today, and compared the results to the patient's prior recorded data.  No programming changes were made.  The LVAD is functioning within specified parameters.  The patient performs LVAD self-test daily.  LVAD interrogation was negative for any significant power changes, alarms or PI events/speed drops.  LVAD equipment check completed and is in good working order.  Back-up equipment present.   LVAD education done on emergency procedures and precautions and reviewed exit site care.  Length of Stay: 1  Aundria Rud NP-C 02/12/2013, 7:29 AM  VAD Team Pager [phone] (7am - 7am)  Patient seen and examined with Ulla Potash, NP. We discussed all aspects of the  encounter. I agree with the assessment and plan as stated above.   Much improved with IV diuresis. Plan RHC this am to evaluate for RV failure given profound exertional fatigue and recent echo findings. VAD interrogation looks ok. Labs ok. Still in NSR.   Daniel Bensimhon,MD 7:53 AM

## 2013-02-12 NOTE — Progress Notes (Signed)
HeartMate 2 Rounding Note  Subjective:    [name] is a 67 y/o woman with h/o HTN, [name], VT s/p Biotronik ICD (2009) and severe systolic 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 EF 15-25% with moderate RV dysfunction   8/18 Underwent HM II LVAD implant (under destination criteria) along with TV repair. Post-op couse c/b by severe RV failure and shock requiring take back to the OR for bleeding and temproray RVAD support.   Was admitted to 10/4-10/08/2012 multiple PI events in setting of restarting b-blocker. Echo showed severe RV dysfunction with recurrent severe TR. PI events resolved with changing controller and stopping b-blocker. Also treated with abx (Keflex) for possible UTI and driveline trauma. Seen in clinic 10/8 and feeling OK. Cr up slightly from 1.4->1.6. Hydralazine titrated due to elevated MAPs. Hgb stable.   Presented to ED yesterday for increased DOE and weakness. Described having a lower chest and epigastrium squeezing sensation. Admitted for suspected recurrent RV failure. Cr stable 1.3. -2L. Weight 154 lbs.   LVAD INTERROGATION:  HeartMate II LVAD:  Flow 4.3 liters/min, speed 9000, power 4.2 , PI 6.1 occasional PI events  Objective:    Vital Signs:   Temp:  [98.2 F (36.8 C)-98.8 F (37.1 C)] 98.2 F (36.8 C) (10/13 0000) Pulse Rate:  [87-101] 88 (10/13 0700) Resp:  [12-31] 18 (10/13 0500) BP: (82)/(0) 82/0 mmHg (10/12 2126) SpO2:  [94 %-99 %] 97 % (10/13 0500) Weight:  [154 lb 5.2 oz (70 kg)-157 lb 4.8 oz (71.351 kg)] 154 lb 5.2 oz (70 kg) (10/13 0500) Last BM Date: 02/11/13 Mean arterial Pressure 70-80s  Intake/Output:   Intake/Output Summary (Last 24 hours) at 02/12/13 0729 Last data filed at 02/12/13 0600  Gross per 24 hour  Intake     60 ml  Output   2250 ml  Net  -2190 ml     Physical Exam: General:  fatigued appearing. No resp difficulty HEENT: normal Neck: supple. JVP to jaw . Carotids 2+ bilat; no bruits. No lymphadenopathy or thryomegaly appreciated. Cor: Mechanical  heart sounds with LVAD hum present. Lungs: clear Abdomen: soft, nontender, nondistended. No hepatosplenomegaly. No bruits or masses. Good bowel sounds. Driveline: C/D/I; securement device intact. No erythema or drainage. Dressing is being changed QOD.  Extremities: no cyanosis, clubbing, rash, edema Neuro: alert & orientedx3, cranial nerves grossly intact. moves all 4 extremities w/o difficulty. Affect pleasant  Telemetry: 90  Labs: Basic Metabolic Panel:  Recent Labs Lab 02/07/13 1605 02/11/13 2049 02/12/13 0330  NA 140 134* 135  K 4.3 3.9 3.8  CL 103 100 100  CO2 20 17* 20  GLUCOSE 114* 140* 96  BUN 19 17 16  CREATININE 1.62* 1.32* 1.30*  CALCIUM 9.8 9.9 9.5    Liver Function Tests:  Recent Labs Lab 02/11/13 2049  AST 20  ALT 18  ALKPHOS 113  BILITOT 1.0  PROT 7.6  ALBUMIN 4.1   No results found for this basename: LIPASE, AMYLASE,  in the last 168 hours No results found for this basename: AMMONIA,  in the last 168 hours  CBC:  Recent Labs Lab 02/07/13 1605 02/11/13 2049 02/12/13 0330  WBC 13.2* 12.9* 9.9  HGB 11.0* 11.4* 10.5*  HCT 35.1* 35.1* 33.0*  MCV 88.9 86.2 86.8  PLT 342 339 292    INR:  Recent Labs Lab 02/07/13 1605 02/11/13 2049 02/12/13 0330  INR 2.22* 1.93* 2.08*    Other results:  EKG:   Imaging: Dg Chest Portable 1 View  02/11/2013   CLINICAL   DATA:  Shortness of breath.  EXAM: PORTABLE CHEST - 1 VIEW  COMPARISON:  01/18/2013.  FINDINGS: Cardiomegaly. Left ventricular assist device in apparent good position, unchanged. Single lead transvenous AICD again noted. Previous valvuloplasty. Moderate vascular congestion. Small bilateral pleural effusions are stable.  IMPRESSION: Slight worsening aeration, suspect early congestive failure.   Electronically Signed   By: John  Curnes M.D.   On: 02/11/2013 21:14      Medications:     Scheduled Medications: . amiodarone  100 mg Oral Daily  . amLODipine  10 mg Oral Daily  . aspirin  EC  325 mg Oral Daily  . citalopram  20 mg Oral Daily  . digoxin  0.0625 mg Oral Daily  . hydrALAZINE  25 mg Oral TID  . pantoprazole  40 mg Oral Daily  . potassium chloride  20 mEq Oral Daily  . sildenafil  40 mg Oral TID  . simvastatin  10 mg Oral QHS  . sodium chloride  3 mL Intravenous Q12H  . sodium chloride  3 mL Intravenous Q12H  . sodium chloride  3 mL Intravenous Q12H  . spironolactone  25 mg Oral Daily  . torsemide  20 mg Oral Daily  . Warfarin - Pharmacist Dosing Inpatient   Does not apply q1800     Infusions:     PRN Medications:  sodium chloride, sodium chloride, sodium chloride, acetaminophen, sodium chloride, sodium chloride, sodium chloride, zolpidem   Assessment:   1. Acute on chronic systolic HF with biventricular HF EF 15%  --S/p HM II VAD with TV repair 12/18/12  --s/p RVAD. 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 VT s/p Biotronik ICD on amio  4. Chronic renal failure, stage III  5. Severe TR s/p TV repair   Plan/Discussion:    Patient was out of amiodarone and torsemide for multiple days and was admitted for increased SOB. Received 80 mg IV lasix last night and had 2L out. SOB much improved. Going for RHC today to assess RV and CO/CI.   I reviewed the LVAD parameters from today, and compared the results to the patient's prior recorded data.  No programming changes were made.  The LVAD is functioning within specified parameters.  The patient performs LVAD self-test daily.  LVAD interrogation was negative for any significant power changes, alarms or PI events/speed drops.  LVAD equipment check completed and is in good working order.  Back-up equipment present.   LVAD education done on emergency procedures and precautions and reviewed exit site care.  Length of Stay: 1  Cosgrove, Ali B NP-C 02/12/2013, 7:29 AM  VAD Team Pager [phone] (7am - 7am)  Patient seen and examined with Ali Cosgrove, NP. We discussed all aspects of the  encounter. I agree with the assessment and plan as stated above.   Much improved with IV diuresis. Plan RHC this am to evaluate for RV failure given profound exertional fatigue and recent echo findings. VAD interrogation looks ok. Labs ok. Still in NSR.   Dakwon Wenberg,MD 7:53 AM   

## 2013-02-12 NOTE — Interval H&P Note (Signed)
History and Physical Interval Note:  02/12/2013 8:08 AM  Kaitlin [name]  has presented today for surgery, with the diagnosis of chf  The various methods of treatment have been discussed with the patient and family. After consideration of risks, benefits and other options for treatment, the patient has consented to  Procedure(s): RIGHT HEART CATH (N/A) as a surgical intervention .  The patient's history has been reviewed, patient examined, no change in status, stable for surgery.  I have reviewed the patient's chart and labs.  Questions were answered to the patient's satisfaction.     Daniel Bensimhon

## 2013-02-13 ENCOUNTER — Other Ambulatory Visit (HOSPITAL_COMMUNITY): Payer: Self-pay | Admitting: *Deleted

## 2013-02-13 DIAGNOSIS — Z7901 Long term (current) use of anticoagulants: Secondary | ICD-10-CM

## 2013-02-13 DIAGNOSIS — Z95811 Presence of heart assist device: Secondary | ICD-10-CM

## 2013-02-13 LAB — BASIC METABOLIC PANEL
BUN: 13 mg/dL (ref 6–23)
CO2: 23 mEq/L (ref 19–32)
Chloride: 100 mEq/L (ref 96–112)
Creatinine, Ser: 1.32 mg/dL — ABNORMAL HIGH (ref 0.50–1.10)
GFR calc Af Amer: 48 mL/min — ABNORMAL LOW (ref >90)
Potassium: 3.5 mEq/L (ref 3.5–5.1)
Sodium: 138 mEq/L (ref 135–145)

## 2013-02-13 LAB — URINE CULTURE

## 2013-02-13 LAB — CBC
HCT: 32.7 % — ABNORMAL LOW (ref 36.0–46.0)
Hemoglobin: 10.6 g/dL — ABNORMAL LOW (ref 12.0–15.0)
MCH: 27.9 pg (ref 26.0–34.0)
MCHC: 32.4 g/dL (ref 30.0–36.0)
RBC: 3.8 MIL/uL — ABNORMAL LOW (ref 3.87–5.11)

## 2013-02-13 LAB — PROTIME-INR
INR: 2.58 — ABNORMAL HIGH (ref 0.00–1.49)
Prothrombin Time: 26.8 seconds — ABNORMAL HIGH (ref 11.6–15.2)

## 2013-02-13 MED ORDER — AMLODIPINE BESYLATE 10 MG PO TABS
10.0000 mg | ORAL_TABLET | Freq: Every day | ORAL | Status: DC
  Administered 2013-02-13: 10 mg via ORAL
  Filled 2013-02-13: qty 1

## 2013-02-13 MED ORDER — WARFARIN SODIUM 2.5 MG PO TABS
2.5000 mg | ORAL_TABLET | Freq: Once | ORAL | Status: DC
  Filled 2013-02-13: qty 1

## 2013-02-13 MED ORDER — TORSEMIDE 20 MG PO TABS
20.0000 mg | ORAL_TABLET | ORAL | Status: DC
  Administered 2013-02-13: 20 mg via ORAL
  Filled 2013-02-13: qty 1

## 2013-02-13 MED ORDER — AMLODIPINE BESYLATE 5 MG PO TABS
5.0000 mg | ORAL_TABLET | Freq: Every day | ORAL | Status: DC
  Filled 2013-02-13: qty 1

## 2013-02-13 MED ORDER — WARFARIN SODIUM 1 MG PO TABS
1.0000 mg | ORAL_TABLET | Freq: Once | ORAL | Status: DC
  Filled 2013-02-13: qty 1

## 2013-02-13 MED ORDER — POTASSIUM CHLORIDE CRYS ER 10 MEQ PO TBCR: EXTENDED_RELEASE_TABLET | ORAL | Status: DC

## 2013-02-13 MED ORDER — TORSEMIDE 20 MG PO TABS: ORAL_TABLET | ORAL | Status: DC

## 2013-02-13 NOTE — Progress Notes (Addendum)
HeartMate 2 Rounding Note  Subjective:    Ms. Eggleton is a 67 y/o woman with h/o HTN, [name], VT s/p Biotronik ICD (2009) and severe systolic 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 EF 15-25% with moderate RV dysfunction   8/18 Underwent HM II LVAD implant (under destination criteria) along with TV repair. Post-op couse c/b by severe RV failure and shock requiring take back to the OR for bleeding and temproray RVAD support.   Was admitted to 10/4-10/08/2012 multiple PI events in setting of restarting b-blocker. Echo showed severe RV dysfunction with recurrent severe TR. PI events resolved with changing controller and stopping b-blocker. Also treated with abx (Keflex) for possible UTI and driveline trauma. Seen in clinic 10/8 and feeling OK. Cr up slightly from 1.4->1.6. Hydralazine titrated due to elevated MAPs. Hgb stable.   Presented to ED 02/11/13 with increased DOE and weakness. Described having a lower chest and epigastrium squeezing sensation. Admitted for suspected recurrent RV failure. Denies any SOB, orthopnea or CP. RHC yesterday with good CO/CI 3.7/2.1 and RV ok.   LVAD INTERROGATION:  HeartMate II LVAD:  Flow 4.3 liters/min, speed 9000, power 4.2 , PI 6.1 occasional PI events  Objective:    Vital Signs:   Temp:  [98.1 F (36.7 C)-98.2 F (36.8 C)] 98.1 F (36.7 C) (10/13 2300) Pulse Rate:  [48-107] 107 (10/14 0500) Resp:  [18-36] 20 (10/14 0600) SpO2:  [90 %-100 %] 98 % (10/14 0600) Last BM Date: 02/11/13 Mean arterial Pressure 60-70s  Intake/Output:   Intake/Output Summary (Last 24 hours) at 02/13/13 0709 Last data filed at 02/12/13 2252  Gross per 24 hour  Intake    103 ml  Output   1520 ml  Net  -1417 ml     Physical Exam: General:  Wll appearing. No resp difficulty; lying in bed HEENT: normal Neck: supple. JVP 7 . Carotids 2+ bilat; no bruits. No lymphadenopathy or thryomegaly appreciated. Cor: Mechanical heart sounds with LVAD hum present. Lungs: clear Abdomen: soft, nontender,  nondistended. No hepatosplenomegaly. No bruits or masses. Good bowel sounds. Driveline: C/D/I; securement device intact. No erythema or drainage. Dressing is being changed QOD.  Extremities: no cyanosis, clubbing, rash, edema Neuro: alert & orientedx3, cranial nerves grossly intact. moves all 4 extremities w/o difficulty. Affect pleasant  Telemetry: SR 80-90s  Labs: Basic Metabolic Panel:  Recent Labs Lab 02/07/13 1605 02/11/13 2049 02/12/13 0330 02/13/13 0355  NA 140 134* 135 138  K 4.3 3.9 3.8 3.5  CL 103 100 100 100  CO2 20 17* 20 23  GLUCOSE 114* 140* 96 89  BUN 19 17 16 13   CREATININE 1.62* 1.32* 1.30* 1.32*  CALCIUM 9.8 9.9 9.5 9.5    Liver Function Tests:  Recent Labs Lab 02/11/13 2049  AST 20  ALT 18  ALKPHOS 113  BILITOT 1.0  PROT 7.6  ALBUMIN 4.1   No results found for this basename: LIPASE, AMYLASE,  in the last 168 hours No results found for this basename: AMMONIA,  in the last 168 hours  CBC:  Recent Labs Lab 02/07/13 1605 02/11/13 2049 02/12/13 0330 02/13/13 0355  WBC 13.2* 12.9* 9.9 10.0  HGB 11.0* 11.4* 10.5* 10.6*  HCT 35.1* 35.1* 33.0* 32.7*  MCV 88.9 86.2 86.8 86.1  PLT 342 339 292 341    INR:  Recent Labs Lab 02/07/13 1605 02/11/13 2049 02/12/13 0330 02/13/13 0355  INR 2.22* 1.93* 2.08* 2.58*    Other results:  EKG:   Imaging: Dg Chest Portable 1 View  02/11/2013  CLINICAL DATA:  Shortness of breath.  EXAM: PORTABLE CHEST - 1 VIEW  COMPARISON:  01/18/2013.  FINDINGS: Cardiomegaly. Left ventricular assist device in apparent good position, unchanged. Single lead transvenous AICD again noted. Previous valvuloplasty. Moderate vascular congestion. Small bilateral pleural effusions are stable.  IMPRESSION: Slight worsening aeration, suspect early congestive failure.   Electronically Signed   By: Davonna Belling M.D.   On: 02/11/2013 21:14     Medications:     Scheduled Medications: . amiodarone  100 mg Oral Daily  .  amLODipine  10 mg Oral Daily  . aspirin EC  325 mg Oral Daily  . citalopram  20 mg Oral Daily  . digoxin  0.0625 mg Oral Daily  . hydrALAZINE  25 mg Oral TID  . pantoprazole  40 mg Oral Daily  . potassium chloride  20 mEq Oral Daily  . sildenafil  40 mg Oral TID  . simvastatin  10 mg Oral QHS  . sodium chloride  3 mL Intravenous Q12H  . sodium chloride  3 mL Intravenous Q12H  . spironolactone  25 mg Oral Daily  . torsemide  20 mg Oral Daily  . warfarin  1 mg Oral ONCE-1800  . Warfarin - Pharmacist Dosing Inpatient   Does not apply q1800    Infusions:    PRN Medications: sodium chloride, sodium chloride, acetaminophen, acetaminophen, ondansetron (ZOFRAN) IV, sodium chloride, sodium chloride, zolpidem   Assessment:   1. Acute on chronic systolic HF with biventricular HF EF 15%  --S/p HM II VAD with TV repair 12/18/12  --s/p RVAD. 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 VT s/p Biotronik ICD on amio  4. Chronic renal failure, stage III  5. Severe TR s/p TV repair   Plan/Discussion:    Went for cath yesterday for suspected RV failure, however hemodynamics looked good. There was significant TR, but RV output relatively well compensated. Last night had to hold anti-hypertensives d/t MAPs in the 60s. INR is trending up on home dose. Concerned that patient and family are not taking medications correctly at home and may need to adjust coumadin dose and home medications. Have had multiple education sessions with family and patient on medications and pharmacy has developed a Excel chart to help. Will ask nursing staff to check MAPs every 2 hrs after am doses of meds. Original discharge after surgery she was not on hydralazine and norvasc was 5 mg daily. Will cut norvasc back to 5 mg daily.  Volume status ok will give another dose of 20 mg torsemide today and then switch to 20 mg QOD. Can discharge later this am once medications are sorted out and pill box refilled.   I  reviewed the LVAD parameters from today, and compared the results to the patient's prior recorded data.  No programming changes were made.  The LVAD is functioning within specified parameters.  The patient performs LVAD self-test daily.  LVAD interrogation was negative for any significant power changes, alarms or PI events/speed drops.  LVAD equipment check completed and is in good working order.  Back-up equipment present.   LVAD education done on emergency procedures and precautions and reviewed exit site care.  Length of Stay: 2  Aundria Rud NP-C 02/13/2013, 7:09 AM  VAD Team Pager [phone] (7am - 7am)   Patient seen and examined with Ulla Potash, NP. We discussed all aspects of the encounter. I agree with the assessment and plan as stated above.   Looks good  today. RHC result reviewed. Hemodynamics stable. Reinforced need to be compliant with medications. BP cuff calibrated today and MAPs actually still around 100 so will continue previous outpatient regimen. Extensive teaching performed by VAD team and Pharmacy (thanks!). VAD interrogation is stable. Can go home today with close f/u. Will need referral for transplant consideration.   Jilian West,MD 9:07 AM

## 2013-02-13 NOTE — Progress Notes (Signed)
ANTICOAGULATION CONSULT NOTE - Follow-Up Consult  Pharmacy Consult for Coumadin Indication: LVAD  No Known Allergies  Patient Measurements: Height: 5\' 3"  (160 cm) Weight: 154 lb 5.2 oz (70 kg) IBW/kg (Calculated) : 52.4  Vital Signs: Temp: 98.1 F (36.7 C) (10/13 2300) Temp src: Oral (10/13 2300) Pulse Rate: 107 (10/14 0500)  Labs:  Recent Labs  02/11/13 2049 02/12/13 0330 02/13/13 0355  HGB 11.4* 10.5* 10.6*  HCT 35.1* 33.0* 32.7*  PLT 339 292 341  LABPROT 21.5* 22.7* 26.8*  INR 1.93* 2.08* 2.58*  CREATININE 1.32* 1.30* 1.32*    Estimated Creatinine Clearance: 39.8 ml/min (by C-G formula based on Cr of 1.32).   Medical History: Past Medical History  Diagnosis Date  . Paroxysmal supraventricular tachycardia   . Hypokalemia   . Chronic systolic heart failure     a. 10/5782 Echo: EF 15%, mod to sev antlat and inflat HK, inf AK, mild to mod MR, mildly/mod reduced RV fxn, Mod TR, PASP .  [name] History of DVT (deep vein thrombosis)   . Dyslipidemia   . HTN (hypertension)   . Nonischemic cardiomyopathy     a. 06/2012 Echo: EF 15%;  b. 06/2012 Cath: nl except luminal irregs in LAD.  [name] Ventricular tachycardia     a. s/p ICD  . Goiter 2001  . Implantable cardiac defibrillator- Biotronik 2009    Single chamber  . [name] (left bundle branch block)     intermittent  . Palliative care patient     Medications:  Prescriptions prior to admission  Medication Sig Dispense Refill  . acetaminophen (TYLENOL) 325 MG tablet Take 650 mg by mouth every 6 (six) hours as needed for pain.      [name] amiodarone (PACERONE) 200 MG tablet Take 0.5 tablets (100 mg total) by mouth daily.  30 tablet  6  . amLODipine (NORVASC) 5 MG tablet Take 2 tablets (10 mg total) by mouth daily.  60 tablet  6  . aspirin EC 325 MG EC tablet Take 1 tablet (325 mg total) by mouth daily.  30 tablet  0  . cephALEXin (KEFLEX) 500 MG capsule Take 500 mg by mouth 4 (four) times daily. Start date 02/04/13; Duration  unknown to pt      . citalopram (CELEXA) 20 MG tablet Take 1 tablet (20 mg total) by mouth daily.  30 tablet  6  . digoxin (LANOXIN) 0.125 MG tablet Take 0.5 tablets (0.0625 mg total) by mouth daily.  30 tablet  6  . ferrous fumarate-b12-vitamic C-folic acid (TRINSICON / FOLTRIN) capsule Take 1 capsule by mouth 3 (three) times daily after meals.  90 capsule  6  . hydrALAZINE (APRESOLINE) 25 MG tablet Take 1 tablet (25 mg total) by mouth 3 (three) times daily.  90 tablet  6  . metoprolol succinate (TOPROL-XL) 25 MG 24 hr tablet Take 25 mg by mouth 2 (two) times daily.      . pantoprazole (PROTONIX) 40 MG tablet Take 1 tablet (40 mg total) by mouth daily.  30 tablet  6  . potassium chloride (K-DUR,KLOR-CON) 10 MEQ tablet Take 30 meq daily  90 tablet  3  . sildenafil (REVATIO) 20 MG tablet Take 40 mg by mouth 3 (three) times daily.      . simvastatin (ZOCOR) 10 MG tablet Take 1 tablet (10 mg total) by mouth at bedtime.  90 tablet  3  . spironolactone (ALDACTONE) 25 MG tablet Take 1 tablet (25 mg total) by mouth daily.  30 tablet  6  . torsemide (DEMADEX) 20 MG tablet Take 1 tablet (20 mg total) by mouth daily.  30 tablet  6  . warfarin (COUMADIN) 2.5 MG tablet Take 2.5 mg by mouth daily with supper.      . zolpidem (AMBIEN) 5 MG tablet Take 1 tablet (5 mg total) by mouth at bedtime as needed for sleep.  30 tablet  2   Scheduled:  . amiodarone  100 mg Oral Daily  . amLODipine  10 mg Oral Daily  . aspirin EC  325 mg Oral Daily  . citalopram  20 mg Oral Daily  . digoxin  0.0625 mg Oral Daily  . hydrALAZINE  25 mg Oral TID  . pantoprazole  40 mg Oral Daily  . potassium chloride  20 mEq Oral Daily  . sildenafil  40 mg Oral TID  . simvastatin  10 mg Oral QHS  . sodium chloride  3 mL Intravenous Q12H  . sodium chloride  3 mL Intravenous Q12H  . spironolactone  25 mg Oral Daily  . torsemide  20 mg Oral Daily  . Warfarin - Pharmacist Dosing Inpatient   Does not apply q1800    Assessment: 67yo  female w/ complicated LVAD procedure in August that required temporary RVAD support now presents w/ progressive weakness w/ increased dyspnea on exertion, unable to walk to bathroom, admitted for further evaluation and possible reinitiation of milrinone, to continue Coumadin; admitted w/ slightly subtherapeutic INR.  Today's INR therapeutic at 2.58, no bleeding or complications noted.  INR rising a little, perhaps due to extra 1 mg of Coumadin given Sunday.  Coumadin dose PTA: 2.5 mg daily  Goal of Therapy:  INR 2-3   Plan:  Continue Coumadin 2.5 mg po x 1 tonight. Continue daily PT/INR.  Tad Moore, BCPS  Clinical Pharmacist Pager [phone]  02/13/2013 6:54 AM

## 2013-02-13 NOTE — Discharge Summary (Signed)
Advanced Heart Failure Team  Discharge Summary   Patient ID: Kaitlin [name] MRN: [id number], DOB/AGE: Jan 08, 1946 67 y.o. Admit date: 02/11/2013 D/C date:     02/13/2013   Primary Discharge Diagnoses:  1) 1. Acute on chronic systolic HF with biventricular HF EF 15%      --S/p HM II VAD with TV repair 12/18/12      --s/p RVAD. - explanted 8/25   Secondary Discharge Diagnoses:  1) RV failure, chronic  2) VT s/p Biotronik ICD     -- on amiodarone 100 mg daily 3) Chronic renal failure, stage III 4) Severe TR s/p TV repair  Hospital Course:  Kaitlin [name] is a 67 y/o woman with h/o HTN, [name], VT s/p Biotronik ICD (2009) and severe systolic 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 EF 15-25% (01/2013) with moderate RV dysfunction.  She underwent LVAD HM II implant along with TV repair 12/18/2012 and her post op course was c/b RV failure requiring temporary RVAD support.  She has been followed closely in the HF clinic and was last seen 02/07/13 and was doing fairly well. She seemed confused about her medications and we reiterated taking her torsemide 20 mg QOD and increased her hydralazine to 25 mg TID for elevated MAP.  She presented to the ED 10/12 with increased DOE and weakness and reported she had not been taking her torsemide or amiodarone for the past 3 days. She was admitted for IV diuresis and plan RHC to assess RV d/t suspicion she might have recurrent RV failure. Pertinent labs on admission were LDH 310, INR 1.93, K+3.9, Cr 1.32, WBC 12.9, Hgb 11.4, and pro-BNP 1947.  RHC 02/12/13 RA = 17  RV = 35/2/12  PA = 35/15 (22)  PCW = 11  Fick cardiac output/index = 5.4/3.1  Thermo CO/CI = 3.7/2.1  PVR = 2.0 WU (Fick)  FA sat = assumed 98%  PA sat = 66%, 70%, 70%  RA sat = 76%  RHC with elevated R-sided pressures but relatively preserved output. She received one dose of IV lasix and was transitioned to PO demadex. After her RHC her MAPs were in the 60s and night time hydralazine was held, however this am  the cuff was recalibrated and her MAPs are still hypertensive 100. Will continue current norvasc 10 mg daily, hydralazine 25 mg TID and Spiro 25 mg daily. Pharmacy and VAD team spent an extensive amount of time educating patient and family on how to take her medications and even filled her pill box. We have asked her to please bring all medications with her to next visit. On discharge weight was 151 lbs.   HeartMate II LVAD: Flow 4.3 liters/min, speed 9000, power 4.2 , PI 6.1 occasional PI events   Discharge Weight Range: 150-154 lbs Discharge Vitals: Blood pressure 82/0, pulse 107, temperature 98.8 F (37.1 C), temperature source Oral, resp. rate 25, height 5\' 3"  (1.6 m), weight 151 lb 0.2 oz (68.5 kg), SpO2 96.00%.  Labs: Lab Results  Component Value Date   WBC 10.0 02/13/2013   HGB 10.6* 02/13/2013   HCT 32.7* 02/13/2013   MCV 86.1 02/13/2013   PLT 341 02/13/2013    Recent Labs Lab 02/11/13 2049  02/13/13 0355  NA 134*  < > 138  K 3.9  < > 3.5  CL 100  < > 100  CO2 17*  < > 23  BUN 17  < > 13  CREATININE 1.32*  < > 1.32*  CALCIUM 9.9  < > 9.5  PROT 7.6  --   --   BILITOT 1.0  --   --   ALKPHOS 113  --   --   ALT 18  --   --   AST 20  --   --   GLUCOSE 140*  < > 89  < > = values in this interval not displayed. Lab Results  Component Value Date   CHOL 153 04/14/2010   HDL 44.60 04/14/2010   LDLCALC 92 04/14/2010   TRIG 159* 12/25/2012   BNP (last 3 results)  Recent Labs  02/03/13 1138 02/07/13 1600 02/11/13 2052  PROBNP 1382.0* 1845.0* 1947.0*    Diagnostic Studies/Procedures   Dg Chest Portable 1 View  02/11/2013   CLINICAL DATA:  Shortness of breath.  EXAM: PORTABLE CHEST - 1 VIEW  COMPARISON:  01/18/2013.  FINDINGS: Cardiomegaly. Left ventricular assist device in apparent good position, unchanged. Single lead transvenous AICD again noted. Previous valvuloplasty. Moderate vascular congestion. Small bilateral pleural effusions are stable.  IMPRESSION: Slight  worsening aeration, suspect early congestive failure.   Electronically Signed   By: Davonna Belling M.D.   On: 02/11/2013 21:14    Discharge Medications     Medication List    STOP taking these medications       cephALEXin 500 MG capsule  Commonly known as:  KEFLEX      TAKE these medications       acetaminophen 325 MG tablet  Commonly known as:  TYLENOL  Take 650 mg by mouth every 6 (six) hours as needed for pain.     amiodarone 200 MG tablet  Commonly known as:  PACERONE  Take 0.5 tablets (100 mg total) by mouth daily.     amLODipine 5 MG tablet  Commonly known as:  NORVASC  Take 2 tablets (10 mg total) by mouth daily.     aspirin 325 MG EC tablet  Take 1 tablet (325 mg total) by mouth daily.     citalopram 20 MG tablet  Commonly known as:  CELEXA  Take 1 tablet (20 mg total) by mouth daily.     digoxin 0.125 MG tablet  Commonly known as:  LANOXIN  Take 0.5 tablets (0.0625 mg total) by mouth daily.     ferrous fumarate-b12-vitamic C-folic acid capsule  Commonly known as:  TRINSICON / FOLTRIN  Take 1 capsule by mouth 3 (three) times daily after meals.     hydrALAZINE 25 MG tablet  Commonly known as:  APRESOLINE  Take 1 tablet (25 mg total) by mouth 3 (three) times daily.     metoprolol succinate 25 MG 24 hr tablet  Commonly known as:  TOPROL-XL  Take 25 mg by mouth 2 (two) times daily.     pantoprazole 40 MG tablet  Commonly known as:  PROTONIX  Take 1 tablet (40 mg total) by mouth daily.     potassium chloride 10 MEQ tablet  Commonly known as:  K-DUR,KLOR-CON  Take 30 meq (3 tablets) every other day. Take only on days you take your demadex.  Start taking on:  02/15/2013     sildenafil 20 MG tablet  Commonly known as:  REVATIO  Take 40 mg by mouth 3 (three) times daily.     simvastatin 10 MG tablet  Commonly known as:  ZOCOR  Take 1 tablet (10 mg total) by mouth at bedtime.     spironolactone 25 MG tablet  Commonly known as:  ALDACTONE  Take 1  tablet (25 mg total) by  mouth daily.     torsemide 20 MG tablet  Commonly known as:  DEMADEX  Every other day or as needed when instructed by your provider.     warfarin 2.5 MG tablet  Commonly known as:  COUMADIN  Take 2.5 mg by mouth daily with supper.     zolpidem 5 MG tablet  Commonly known as:  AMBIEN  Take 1 tablet (5 mg total) by mouth at bedtime as needed for sleep.        Disposition   The patient will be discharged in stable condition to home. Discharge Orders   Future Appointments Provider Department Dept Phone   02/19/2013 11:30 AM [facility] [phone]   05/08/2013 8:25 AM [facility] [phone]   Future Orders Complete By Expires   ACE Inhibitor / ARB already ordered  As directed    Contraindication beta blocker-discharge  As directed    Scheduling Instructions:     RV failure   Diet - low sodium heart healthy  As directed    Discharge instructions  As directed    Comments:     Please bring all medications to your next visit.   Increase activity slowly  As directed    STOP any activity that causes chest pain, shortness of breath, dizziness, sweating, or exessive weakness  As directed          Duration of Discharge Encounter: Greater than 35 minutes   Signed, Aundria Rud  02/13/2013, 11:22 AM  Patient seen and examined with Ulla Potash, NP. We discussed all aspects of the encounter. I agree with the assessment and plan as stated above. I have edited note to reflect my changes. She is ready for d/c. We will see her in HF Clinic soon. She will need referral to [facility] to start formal transplant screening process.  Daniel Bensimhon,MD 11:52 PM

## 2013-02-13 NOTE — Progress Notes (Signed)
All discharge instructions given to patient and daughter, Antionette. Verbalized understanding.  Discharged to home with Antionette, daughter. No changes.

## 2013-02-14 ENCOUNTER — Encounter (HOSPITAL_COMMUNITY): Payer: Commercial Managed Care - PPO

## 2013-02-14 ENCOUNTER — Other Ambulatory Visit: Payer: Self-pay | Admitting: Cardiology

## 2013-02-19 ENCOUNTER — Telehealth (HOSPITAL_COMMUNITY): Payer: Self-pay | Admitting: Cardiology

## 2013-02-19 ENCOUNTER — Ambulatory Visit (HOSPITAL_COMMUNITY)
Admission: RE | Admit: 2013-02-19 | Discharge: 2013-02-19 | Disposition: A | Discharge status: Home / Self Care | Payer: Commercial Managed Care - PPO | Source: Ambulatory Visit | Attending: Internal Medicine | Admitting: Internal Medicine

## 2013-02-19 ENCOUNTER — Other Ambulatory Visit (HOSPITAL_COMMUNITY): Payer: Commercial Managed Care - PPO

## 2013-02-19 ENCOUNTER — Ambulatory Visit (HOSPITAL_COMMUNITY)
Admit: 2013-02-19 | Discharge: 2013-02-19 | Disposition: A | Discharge status: Home / Self Care | Payer: Commercial Managed Care - PPO | Attending: Internal Medicine | Admitting: Internal Medicine

## 2013-02-19 ENCOUNTER — Ambulatory Visit (HOSPITAL_COMMUNITY): Payer: Self-pay | Admitting: *Deleted

## 2013-02-19 VITALS — BP 72/0 | HR 95 | Ht 69.0 in | Wt 154.0 lb

## 2013-02-19 DIAGNOSIS — J90 Pleural effusion, not elsewhere classified: Secondary | ICD-10-CM | POA: Insufficient documentation

## 2013-02-19 DIAGNOSIS — Z95811 Presence of heart assist device: Secondary | ICD-10-CM

## 2013-02-19 DIAGNOSIS — Z95818 Presence of other cardiac implants and grafts: Secondary | ICD-10-CM

## 2013-02-19 DIAGNOSIS — R5381 Other malaise: Secondary | ICD-10-CM | POA: Insufficient documentation

## 2013-02-19 DIAGNOSIS — I509 Heart failure, unspecified: Secondary | ICD-10-CM

## 2013-02-19 DIAGNOSIS — Z7901 Long term (current) use of anticoagulants: Secondary | ICD-10-CM

## 2013-02-19 DIAGNOSIS — R188 Other ascites: Secondary | ICD-10-CM | POA: Insufficient documentation

## 2013-02-19 DIAGNOSIS — I700 Atherosclerosis of aorta: Secondary | ICD-10-CM | POA: Insufficient documentation

## 2013-02-19 DIAGNOSIS — Z950 Presence of cardiac pacemaker: Secondary | ICD-10-CM | POA: Insufficient documentation

## 2013-02-19 DIAGNOSIS — J984 Other disorders of lung: Secondary | ICD-10-CM | POA: Insufficient documentation

## 2013-02-19 DIAGNOSIS — I50810 Right heart failure, unspecified: Secondary | ICD-10-CM

## 2013-02-19 DIAGNOSIS — I501 Left ventricular failure: Secondary | ICD-10-CM | POA: Insufficient documentation

## 2013-02-19 DIAGNOSIS — I2510 Atherosclerotic heart disease of native coronary artery without angina pectoris: Secondary | ICD-10-CM | POA: Insufficient documentation

## 2013-02-19 DIAGNOSIS — Z09 Encounter for follow-up examination after completed treatment for conditions other than malignant neoplasm: Secondary | ICD-10-CM | POA: Insufficient documentation

## 2013-02-19 DIAGNOSIS — I5022 Chronic systolic (congestive) heart failure: Secondary | ICD-10-CM

## 2013-02-19 DIAGNOSIS — I517 Cardiomegaly: Secondary | ICD-10-CM | POA: Insufficient documentation

## 2013-02-19 DIAGNOSIS — K7689 Other specified diseases of liver: Secondary | ICD-10-CM | POA: Insufficient documentation

## 2013-02-19 LAB — BASIC METABOLIC PANEL
BUN: 23 mg/dL (ref 6–23)
CO2: 17 mEq/L — ABNORMAL LOW (ref 19–32)
Chloride: 100 mEq/L (ref 96–112)
GFR calc non Af Amer: 37 mL/min — ABNORMAL LOW (ref >90)
Glucose, Bld: 124 mg/dL — ABNORMAL HIGH (ref 70–99)
Potassium: 4 mEq/L (ref 3.5–5.1)
Sodium: 134 mEq/L — ABNORMAL LOW (ref 135–145)

## 2013-02-19 LAB — URINE MICROSCOPIC-ADD ON

## 2013-02-19 LAB — PROTIME-INR
INR: 2.18 — ABNORMAL HIGH (ref 0.00–1.49)
Prothrombin Time: 23.6 seconds — ABNORMAL HIGH (ref 11.6–15.2)

## 2013-02-19 LAB — URINALYSIS, ROUTINE W REFLEX MICROSCOPIC
Glucose, UA: NEGATIVE mg/dL
Hgb urine dipstick: NEGATIVE
Ketones, ur: NEGATIVE mg/dL
Specific Gravity, Urine: 1.012 (ref 1.005–1.030)
pH: 6.5 (ref 5.0–8.0)

## 2013-02-19 LAB — CBC
Hemoglobin: 11.1 g/dL — ABNORMAL LOW (ref 12.0–15.0)
RBC: 3.89 MIL/uL (ref 3.87–5.11)
WBC: 10.6 10*3/uL — ABNORMAL HIGH (ref 4.0–10.5)

## 2013-02-19 LAB — DIFFERENTIAL
Basophils Relative: 0 % (ref 0–1)
Eosinophils Relative: 1 % (ref 0–5)
Lymphs Abs: 0.8 10*3/uL (ref 0.7–4.0)
Monocytes Relative: 6 % (ref 3–12)
Neutro Abs: 9.1 10*3/uL — ABNORMAL HIGH (ref 1.7–7.7)

## 2013-02-19 LAB — LACTATE DEHYDROGENASE: LDH: 334 U/L — ABNORMAL HIGH (ref 94–250)

## 2013-02-19 MED ORDER — SILDENAFIL CITRATE 20 MG PO TABS: 40.0000 mg | ORAL_TABLET | Freq: Three times a day (TID) | ORAL | Status: DC

## 2013-02-19 NOTE — Telephone Encounter (Signed)
PRIOR AUTHORZATION not required for CT chest, abdomen and pelvis per pts insurance

## 2013-02-19 NOTE — Progress Notes (Signed)
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 Post-op couse c/b RHF requiring take back to the OR for bleeding and temproray RVAD support.  Admitted 10/12-10/14/14 for fatigue. Concern was for low output due to RHF. Echo with severe RV dysfunction and severe TR. However, RHC with relativel well preserved output.  RA = 17  RV = 35/2/12  PA = 35/15 (22)  PCW = 11  Fick cardiac output/index = 5.4/3.1  Thermo CO/CI = 3.7/2.1  PVR = 2.0 WU (Fick)  FA sat = assumed 98%  PA sat = 66%, 70%, 70%  RA sat = 76%  Follow up:  Presents today for post hospital f/u. Remains extremely fatigued with exertional dyspnea. Struggles with ADLs. Mild edema. No orthopnea or PND. Denies fevers. Feels cold but no frank chills. No bleeding episodes. No driveline discharge or problems with site. No focal neuro complaints. They have been working with pharmacist here to get meds into pill box.  Denies LVAD alarms.  Denies ICD shocks.    Reports taking Coumadin as prescribed and adherence to anticoagulation based dietary restrictions.  Denies bright red blood per rectum or melena, no dark urine or hematuria.    Symptom Yes No Details  Angina       x Activity:  Claudication       x How far:  Syncope       x When:  Stroke       x   Orthopnea       x How many pillows: one  PND       x How often:  CPAP       x How many hrs:  Pedal edema             x   Abd fullness            x   N&V        x  poor appetite; gets full with small meals; vomited x 1  Diaphoresis            x When:  ? Hot flashes at night  Bleeding        x   Urine    light yellow  SOB      x  Activity:  Conversational SOB  Palpitations         x When:  ICD shock         x   Hospitlizaitons       x  When/where/why:  10/12 - 10/14 DOE; fatigue  ED  visit         x When/where/why:  Other MD         x When/who/why:  Activity    Home PT/OT - one time weekly; fatigue  Fluid        < 1500  Diet     Low sodium   Dressing change per VAD coordinator using sterile technique. Dressing removed, site  cleansed with Chlora prep applicators x 2, dried, and guaze dressing with aquacel strip. The site has complete tissue ingrowth, no erythema, tenderness, drainage, or foul odor;  anchor device in place. Pt denies fever or chills. Does c/o "feeling hot" during the night; checked her temperature during one episode and did not have fever. Caregiver  changing dressing every other day. Will advance dressing change to every three days.    Pt/caregiver deny any alarms or VAD equipment issues.   Past Medical History  Diagnosis Date  . Paroxysmal supraventricular tachycardia   . Hypokalemia   . Chronic systolic heart failure     a. 12/4130 Echo: EF 15%, mod to sev antlat and inflat HK, inf AK, mild to mod MR, mildly/mod reduced RV fxn, Mod TR, PASP .  [name] History of DVT (deep vein thrombosis)   . Dyslipidemia   . HTN (hypertension)   . Nonischemic cardiomyopathy     a. 06/2012 Echo: EF 15%;  b. 06/2012 Cath: nl except luminal irregs in LAD.  [name] Ventricular tachycardia     a. s/p ICD  . Goiter 2001  . Implantable cardiac defibrillator- Biotronik 2009    Single chamber  . [name] (left bundle branch block)     intermittent  . Palliative care patient     Current Outpatient Prescriptions  Medication Sig Dispense Refill  . acetaminophen (TYLENOL) 325 MG tablet Take 650 mg by mouth every 6 (six) hours as needed for pain.      [name] amiodarone (PACERONE) 200 MG tablet Take 0.5 tablets (100 mg total) by mouth daily.  30 tablet  6  . amLODipine (NORVASC) 5 MG tablet Take 2 tablets (10 mg total) by mouth daily.  60 tablet  6  . aspirin EC 325 MG EC tablet Take 1 tablet (325 mg total) by mouth daily.  30 tablet  0  . citalopram (CELEXA) 20 MG tablet Take 1 tablet  (20 mg total) by mouth daily.  30 tablet  6  . digoxin (LANOXIN) 0.125 MG tablet Take 0.5 tablets (0.0625 mg total) by mouth daily.  30 tablet  6  . ferrous fumarate-b12-vitamic C-folic acid (TRINSICON / FOLTRIN) capsule Take 1 capsule by mouth 3 (three) times daily after meals.  90 capsule  6  . hydrALAZINE (APRESOLINE) 25 MG tablet Take 1 tablet (25 mg total) by mouth 3 (three) times daily.  90 tablet  6  . pantoprazole (PROTONIX) 40 MG tablet Take 1 tablet (40 mg total) by mouth daily.  30 tablet  6  . potassium chloride (K-DUR,KLOR-CON) 10 MEQ tablet Take 30 meq (3 tablets) every other day. Take only on days you take your demadex.  90 tablet  3  . sildenafil (REVATIO) 20 MG tablet Take 40 mg by mouth 3 (three) times daily.      . simvastatin (ZOCOR) 10 MG tablet Take 1 tablet (10 mg total) by mouth at bedtime.  90 tablet  3  . spironolactone (ALDACTONE) 25 MG tablet Take 1 tablet (25 mg total) by mouth daily.  30 tablet  6  . torsemide (DEMADEX) 20 MG tablet Every other day or as needed when instructed by your provider.  45 tablet  6  . warfarin (COUMADIN) 2.5 MG tablet Take 2.5 mg by mouth daily with supper.      . zolpidem (AMBIEN) 5 MG tablet Take 1 tablet (5 mg total) by mouth at bedtime as needed for sleep.  30 tablet  2   No current facility-administered medications for this encounter.    Review of patient's allergies indicates no known allergies.  REVIEW OF SYSTEMS: All systems negative except as listed in HPI, PMH and Problem list.  LVAD INTERROGATION:  Speed:  8600 Flow:  4.9 Power:  4.9 PI: 6.3 Alarms:  none Events: few PI events Fixed speed:  8600 Low  speed limit: 8000   I reviewed the LVAD parameters from today, and compared the results to the patient's prior recorded data.  No programming changes were made.  The LVAD is functioning within specified parameters.  The patient performs LVAD self-test daily.  LVAD interrogation was negative for any significant power  changes, alarms or PI events/speed drops.  LVAD equipment check completed and is in good working order.  Back-up equipment present.   LVAD education done on emergency procedures and precautions and reviewed exit site care.   Physical Exam: Filed Vitals:   02/19/13 1140  Pulse: 95  Height: 5\' 9"  (1.753 m)  Weight: 154 lb (69.854 kg)  SpO2: 97%  Last weight:  159  GENERAL: Fatigued appearing,no acute distress. HEENT: normal  NECK: Supple, JVP 9  .  2+ bilaterally, no bruits.  No lymphadenopathy or thyromegaly appreciated.   CARDIAC:  Mechanical heart sounds with LVAD hum present.  LUNGS:  Clear to auscultation bilaterally.  ABDOMEN:  Soft, round, nontender, positive bowel sounds x4.     LVAD exit site: well-healed and incorporated.  Dressing dry and intact.  No erythema or drainage.  Stabilization device present and accurately applied.  Driveline dressing is being changed daily per sterile technique. EXTREMITIES:  Warm and dry, no cyanosis, clubbing, rash. Tr-1+ edema  NEUROLOGIC:  Alert and oriented x 4.  Gait steady.  No aphasia.  No dysarthria.  Affect pleasant.      ASSESSMENT AND PLAN:  1. Chronic systolic HF Status post HM-II LVAD implantation - Patient comes to clinic today for hospital discharge f/u. See below for details 2. Right heart failure 3. PAF 4. Anticoagulation management - INR goal 2.0-3.0. INR pending.  5. HTN - MAP 72 today  6. Insomnia - improved with Ambien 5 mg hs  Return to clinic  Kaitlin [name], VAD Coordinator   11:51 AM  Patient seen and examined with Kaitlin [name] . We discussed all aspects of the encounter. I agree with the assessment and plan as stated above.   Kaitlin [name] continues to struggle post VAD implant. My main concern has been recurrent RHF but she has not had many PI events on VAD and RHC last week is relatively reassuring though R-sided pressures do remain high in setting of severe TR. We drew labs today and performed CT imaging of her C/A/P.  There is evidence of some fluid overload but no convincing evidence of driveline infection/abscess or other active process. We have increased her diuretics and will f/u with her next week. We will forward her information to the [facility] transplant team as I think she will need OHTx sooner rather than later.   Kaitlin Klugh,MD 1:10 PM

## 2013-02-20 ENCOUNTER — Telehealth (HOSPITAL_COMMUNITY): Payer: Self-pay | Admitting: *Deleted

## 2013-02-20 NOTE — Patient Instructions (Signed)
No change in coumadin dose

## 2013-02-20 NOTE — Telephone Encounter (Signed)
Called and spoke with patient for update on how she is feeling. States she is feeling "some better". State she isn't quite as SOB, and had "a little more" urine output with zaroxolyn dose yesterday. States she may still have "little" swelling in feet and ankles, but "doesn't feel bad enough to come to hospital".  Dr. Gala Romney updated - instructed patient to take torsemide 40 mg tomorrow and next day; VAD coordinator will call and check on her status Thursday. Instructed pt to call if symptoms worsen or do not improve prior to Thursday - pt verbalized understanding of above; VAD coordinator will give above instructions to daughter as well.

## 2013-02-21 ENCOUNTER — Other Ambulatory Visit (HOSPITAL_COMMUNITY): Payer: Self-pay | Admitting: *Deleted

## 2013-02-21 ENCOUNTER — Encounter (HOSPITAL_COMMUNITY): Payer: Commercial Managed Care - HMO

## 2013-02-21 MED ORDER — PANTOPRAZOLE SODIUM 40 MG PO TBEC: 40.0000 mg | DELAYED_RELEASE_TABLET | Freq: Every day | ORAL | Status: DC

## 2013-02-21 MED ORDER — SPIRONOLACTONE 25 MG PO TABS: 25.0000 mg | ORAL_TABLET | Freq: Every day | ORAL | Status: DC

## 2013-02-21 MED ORDER — CITALOPRAM HYDROBROMIDE 20 MG PO TABS: 20.0000 mg | ORAL_TABLET | Freq: Every day | ORAL | Status: DC

## 2013-02-22 ENCOUNTER — Other Ambulatory Visit (HOSPITAL_COMMUNITY): Payer: Self-pay | Admitting: *Deleted

## 2013-02-22 DIAGNOSIS — Z95811 Presence of heart assist device: Secondary | ICD-10-CM

## 2013-02-22 DIAGNOSIS — I50810 Right heart failure, unspecified: Secondary | ICD-10-CM

## 2013-02-22 DIAGNOSIS — Z7901 Long term (current) use of anticoagulants: Secondary | ICD-10-CM

## 2013-02-28 ENCOUNTER — Ambulatory Visit (HOSPITAL_COMMUNITY)
Admission: RE | Admit: 2013-02-28 | Discharge: 2013-02-28 | Disposition: A | Discharge status: Home / Self Care | Payer: Commercial Managed Care - PPO | Source: Ambulatory Visit | Attending: Internal Medicine | Admitting: Internal Medicine

## 2013-02-28 ENCOUNTER — Ambulatory Visit (HOSPITAL_COMMUNITY): Payer: Self-pay | Admitting: *Deleted

## 2013-02-28 ENCOUNTER — Encounter (HOSPITAL_COMMUNITY): Payer: Self-pay | Admitting: Anesthesiology

## 2013-02-28 VITALS — BP 80/0 | HR 88 | Ht 63.0 in | Wt 148.8 lb

## 2013-02-28 DIAGNOSIS — Z950 Presence of cardiac pacemaker: Secondary | ICD-10-CM | POA: Insufficient documentation

## 2013-02-28 DIAGNOSIS — Z95818 Presence of other cardiac implants and grafts: Secondary | ICD-10-CM

## 2013-02-28 DIAGNOSIS — I509 Heart failure, unspecified: Secondary | ICD-10-CM

## 2013-02-28 DIAGNOSIS — E876 Hypokalemia: Secondary | ICD-10-CM | POA: Insufficient documentation

## 2013-02-28 DIAGNOSIS — I50810 Right heart failure, unspecified: Secondary | ICD-10-CM

## 2013-02-28 DIAGNOSIS — G4700 Insomnia, unspecified: Secondary | ICD-10-CM | POA: Insufficient documentation

## 2013-02-28 DIAGNOSIS — Z7901 Long term (current) use of anticoagulants: Secondary | ICD-10-CM

## 2013-02-28 DIAGNOSIS — Z95811 Presence of heart assist device: Secondary | ICD-10-CM

## 2013-02-28 DIAGNOSIS — I5022 Chronic systolic (congestive) heart failure: Secondary | ICD-10-CM | POA: Insufficient documentation

## 2013-02-28 DIAGNOSIS — I10 Essential (primary) hypertension: Secondary | ICD-10-CM | POA: Insufficient documentation

## 2013-02-28 LAB — COMPREHENSIVE METABOLIC PANEL
AST: 27 U/L (ref 0–37)
Albumin: 4.7 g/dL (ref 3.5–5.2)
Alkaline Phosphatase: 107 U/L (ref 39–117)
BUN: 40 mg/dL — ABNORMAL HIGH (ref 6–23)
Calcium: 10.7 mg/dL — ABNORMAL HIGH (ref 8.4–10.5)
Potassium: 2.7 mEq/L — CL (ref 3.5–5.1)
Sodium: 136 mEq/L (ref 135–145)
Total Bilirubin: 1.3 mg/dL — ABNORMAL HIGH (ref 0.3–1.2)
Total Protein: 8.5 g/dL — ABNORMAL HIGH (ref 6.0–8.3)

## 2013-02-28 LAB — CBC
HCT: 34.9 % — ABNORMAL LOW (ref 36.0–46.0)
MCHC: 33 g/dL (ref 30.0–36.0)
Platelets: 319 10*3/uL (ref 150–400)
RDW: 16.1 % — ABNORMAL HIGH (ref 11.5–15.5)

## 2013-02-28 LAB — PROTIME-INR: INR: 1.44 (ref 0.00–1.49)

## 2013-02-28 LAB — LACTATE DEHYDROGENASE: LDH: 306 U/L — ABNORMAL HIGH (ref 94–250)

## 2013-02-28 LAB — PRO B NATRIURETIC PEPTIDE: Pro B Natriuretic peptide (BNP): 2290 pg/mL — ABNORMAL HIGH (ref 0–125)

## 2013-02-28 MED ORDER — CEPHALEXIN 500 MG PO CAPS: 500.0000 mg | ORAL_CAPSULE | Freq: Three times a day (TID) | ORAL | Status: DC

## 2013-02-28 MED ORDER — ENOXAPARIN SODIUM 60 MG/0.6ML ~~LOC~~ SOLN: 60.0000 mg | Freq: Two times a day (BID) | SUBCUTANEOUS | Status: DC

## 2013-02-28 NOTE — Progress Notes (Signed)
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 Post-op couse c/b RHF requiring take back to the OR for bleeding and temproray RVAD support.  Admitted 10/12-10/14/14 for fatigue. Concern was for low output due to RHF. Echo with severe RV dysfunction and severe TR. However, RHC with relativel well preserved output.  RA = 17  RV = 35/2/12  PA = 35/15 (22)  PCW = 11  Fick cardiac output/index = 5.4/3.1  Thermo CO/CI = 3.7/2.1  PVR = 2.0 WU (Fick)  FA sat = assumed 98%  PA sat = 66%, 70%, 70%  RA sat = 76%  Follow up: Last visit was very fatigued and had conversational SOB. CT done and no evidence of fluid, however mild concern of driveline infection with gas presentation. We increased diuretics for a couple of days and today she feels much better. Weight is down 6 lbs. Feeling stronger and was able to go to [employer] with daughter and walk around. Denies SOB, orthopnea or CP. Still having mild DOE. Denies fevers. No bleeding episodes. No driveline discharge or problems with site.  Denies LVAD alarms.  Denies ICD shocks.    Reports taking Coumadin as prescribed and adherence to anticoagulation based dietary restrictions.  Denies bright red blood per rectum or melena, no dark urine or hematuria.    Symptom Yes No Details  Angina       x Activity:  Claudication       x How far:  Syncope       x When:   Occasional dizziness while sitting  Stroke       x   Orthopnea       x How many pillows: one  PND       x How often:  CPAP       x How many hrs:  Pedal edema         x       Clears overnight  Abd fullness            x   N&V        x  fair appetite; gets full with small meals; vomited x 1  Diaphoresis            x When:   Hot flashes at night  Bleeding        x   Urine    light yellow  SOB       x  Activity:  improved  Palpitations         x When:  ICD shock         x   Hospitlizaitons               x When/where/why:    ED visit         x When/where/why:  Other MD         x When/who/why:  Activity    Home PT/OT - one time weekly; fatigue is better; walking twice at [facility]  Fluid        < 1500   Diet     Low sodium     Past Medical History  Diagnosis Date  . Paroxysmal supraventricular tachycardia   . Hypokalemia   . Chronic systolic heart failure  a. 06/2012 Echo: EF 15%, mod to sev antlat and inflat HK, inf AK, mild to mod MR, mildly/mod reduced RV fxn, Mod TR, PASP .  [name] History of DVT (deep vein thrombosis)   . Dyslipidemia   . HTN (hypertension)   . Nonischemic cardiomyopathy     a. 06/2012 Echo: EF 15%;  b. 06/2012 Cath: nl except luminal irregs in LAD.  [name] Ventricular tachycardia     a. s/p ICD  . Goiter 2001  . Implantable cardiac defibrillator- Biotronik 2009    Single chamber  . [name] (left bundle branch block)     intermittent  . Palliative care patient     Current Outpatient Prescriptions  Medication Sig Dispense Refill  . acetaminophen (TYLENOL) 325 MG tablet Take 650 mg by mouth every 6 (six) hours as needed for pain.      [name] amiodarone (PACERONE) 200 MG tablet Take 0.5 tablets (100 mg total) by mouth daily.  30 tablet  6  . amLODipine (NORVASC) 5 MG tablet Take 2 tablets (10 mg total) by mouth daily.  60 tablet  6  . aspirin EC 325 MG EC tablet Take 1 tablet (325 mg total) by mouth daily.  30 tablet  0  . citalopram (CELEXA) 20 MG tablet Take 1 tablet (20 mg total) by mouth daily.  90 tablet  3  . digoxin (LANOXIN) 0.125 MG tablet Take 0.5 tablets (0.0625 mg total) by mouth daily.  30 tablet  6  . ferrous fumarate-b12-vitamic C-folic acid (TRINSICON / FOLTRIN) capsule Take 1 capsule by mouth 3 (three) times daily after meals.  90 capsule  6  . hydrALAZINE (APRESOLINE) 25 MG tablet Take 1 tablet (25 mg total) by mouth 3 (three) times daily.  90  tablet  6  . metolazone (ZAROXOLYN) 2.5 MG tablet Take 2.5 mg by mouth. As directed      . pantoprazole (PROTONIX) 40 MG tablet Take 1 tablet (40 mg total) by mouth daily.  90 tablet  3  . potassium chloride (K-DUR,KLOR-CON) 10 MEQ tablet 10 mEq 3 (three) times daily.      . sildenafil (REVATIO) 20 MG tablet Take 2 tablets (40 mg total) by mouth 3 (three) times daily.  720 tablet  2  . simvastatin (ZOCOR) 10 MG tablet Take 1 tablet (10 mg total) by mouth at bedtime.  90 tablet  3  . spironolactone (ALDACTONE) 25 MG tablet Take 1 tablet (25 mg total) by mouth daily.  90 tablet  3  . torsemide (DEMADEX) 20 MG tablet 20 mg daily.      [name] warfarin (COUMADIN) 2.5 MG tablet Take 2.5 mg by mouth daily with supper.      . zolpidem (AMBIEN) 5 MG tablet Take 1 tablet (5 mg total) by mouth at bedtime as needed for sleep.  30 tablet  2   No current facility-administered medications for this encounter.    Review of patient's allergies indicates no known allergies.  REVIEW OF SYSTEMS: All systems negative except as listed in HPI, PMH and Problem list.  LVAD INTERROGATION:  Speed:  8600 Flow:  4.9 Power:  4.9 PI: 6.3 Alarms:  none Events: few PI events Fixed speed:  8600 Low speed limit: 8000  I reviewed the LVAD parameters from today, and compared the results to the patient's prior recorded data.  No programming changes were made.  The LVAD is functioning within specified parameters.  LVAD interrogation was negative for any significant power changes, alarms or PI events/speed drops.  Physical Exam: Filed Vitals:   02/28/13 1438  BP: 80/0  Pulse: 88  Height: 5\' 3"  (1.6 m)  Weight: 148 lb 12.8 oz (67.495 kg)  SpO2: 98%  Last weight:  154  GENERAL: Well appearing,no acute distress; daughter present HEENT: normal  NECK: Supple, JVP 8  .  2+ bilaterally, no bruits.  No lymphadenopathy or thyromegaly appreciated.   CARDIAC:  Mechanical heart sounds with LVAD hum present.  LUNGS:  Clear to  auscultation bilaterally.  ABDOMEN:  Soft, round, nontender, positive bowel sounds x4.     LVAD exit site: well-healed and incorporated.  Dressing dry and intact.  No erythema or drainage.  Stabilization device present and accurately applied. Mild tenderness to palpation RLQ below driveline exit site. Driveline dressing is being changed Q 3 days per sterile technique. EXTREMITIES:  Warm and dry, no cyanosis, clubbing, rash. Tr edema  NEUROLOGIC:  Alert and oriented x 4.  Gait steady.  No aphasia.  No dysarthria.  Affect pleasant.      ASSESSMENT AND PLAN:  1. Chronic systolic HF Status post HM-II LVAD implantation - Patient comes to clinic today for one week f/u. See below for details 2. Right heart failure 3. PAF 4. Anticoagulation management - INR goal 2.0-3.0. INR 1.44 today  5. HTN - MAP 80 6. Insomnia - not sleeping through the night with Ambien   She is doing much better this week and currently has NYHA III symptoms, but is improving. She is working with PT and reports getting stronger. Labs drawn and Cr slightly elevated and JVD appears flat. Weight down 6 lbs since last visit. Will hold torsemide for 2 days and took the medications out of her pill box. K+ 2.7 will give an extra 40 meq K+ today on top of her scheduled dose and repeat next week. She reports mild tenderness in RLQ below driveline exit site, however it is not where the driveline is tunneled and the exit site is clean with no drainage or odor. WBC trending up and with concern from last week CT will give Keflex 500 mg TID and re-evaluate next visit. If WBC remains elevated will get repeat CT-scan.   INR today 1.44 will start lovenox 60 mg BID until Saturday when [facility] will repeat INR. Pharmacy had lengthy discussion with patient about coumadin and foods to avoid. She reports eating greens 3x this week for the first time since surgery. Stressed the importance that it is ok to eat greens, however she needs to eat them consistently.  Will increase Coumadin for 3 days and then increase weekly dose, discussed with Pharmacy. They also helped fill her pill box.  Continues to not be able to sleep at night with Ambien, discussed taking tylenol PM to help. Continue to work with PT and to eat protein for strength. Will discuss at [facility] meeting next week about sending down to [facility] in the next few weeks to go ahead and work up for heart tx. She remains weak and do not think she would be able to tolerate surgery at this time, but would like to have process started so when she is able if she meets criteria she can be listed. Will follow up in 1 week.    Kaitlin [name] B NP-C 2:44 PM

## 2013-02-28 NOTE — Patient Instructions (Signed)
1.  Keflex 500 mg three times daily for 7 days 2.  Hold torsemide Thurs and Friday (already taken out of pillbox) 3.  Take coumadin 5 mg today, Thurs, and Friday (already in pill box).  Then 2.5 mg daily except 5 mg (two tablets) on Mon, Wed, and Fridays. 4.  Lovenox injections twice daily until instructed to stop 5.  Extra potassium 40 meq today (already in pill box) 6.  Will ask [facility] nurse to check INR on Saturday 7.  Return to VAD clinic in one week

## 2013-03-01 ENCOUNTER — Other Ambulatory Visit: Payer: Self-pay | Admitting: *Deleted

## 2013-03-01 DIAGNOSIS — Z95811 Presence of heart assist device: Secondary | ICD-10-CM

## 2013-03-01 DIAGNOSIS — I509 Heart failure, unspecified: Secondary | ICD-10-CM

## 2013-03-01 DIAGNOSIS — Z7901 Long term (current) use of anticoagulants: Secondary | ICD-10-CM

## 2013-03-01 NOTE — Progress Notes (Signed)
LVAD interrogation reveals:  Speed:  8600 Flow:  4.9 Power:  4.9 PI: 6.3 Alarms:  none Events: few PI events Fixed speed:  8600 Low speed limit: 8000   Dressing change per VAD coordinator using sterile technique. Dressing removed, site  cleansed with Chlora prep applicators x 2, dried, and guaze dressing with aquacel strip. The site has near complete tissue ingrowth, no erythema, tenderness, drainage, or foul odor;  anchor device in place. Pt denies fever or chills. Caregiver changing dressing every three days, will continue same.    Pt/caregiver deny any alarms or VAD equipment issues. VAD coordinator reviewed daily log from home for daily temperature, weight, and VAD parameters. Pt is performing daily controller and system monitor self tests along with completing weekly and monthly maintenance for LVAD equipment.   LVAD equipment check completed and is in good working order.  Back-up equipment present.   LVAD education done on emergency procedures and precautions and reviewed exit site care.

## 2013-03-02 NOTE — Addendum Note (Signed)
Encounter addended by: Aundria Rud, NP on: 03/02/2013  2:47 PM<BR>     Documentation filed: Charges VN

## 2013-03-03 ENCOUNTER — Ambulatory Visit (HOSPITAL_COMMUNITY): Payer: Self-pay | Admitting: *Deleted

## 2013-03-03 DIAGNOSIS — Z95811 Presence of heart assist device: Secondary | ICD-10-CM

## 2013-03-03 DIAGNOSIS — Z7901 Long term (current) use of anticoagulants: Secondary | ICD-10-CM

## 2013-03-03 LAB — PROTIME-INR: INR: 2.2 — AB (ref <=1.1)

## 2013-03-05 MED ORDER — WARFARIN SODIUM 2.5 MG PO TABS: ORAL_TABLET | ORAL | Status: DC

## 2013-03-06 ENCOUNTER — Other Ambulatory Visit: Payer: Self-pay | Admitting: *Deleted

## 2013-03-06 DIAGNOSIS — T829XXA Unspecified complication of cardiac and vascular prosthetic device, implant and graft, initial encounter: Secondary | ICD-10-CM

## 2013-03-07 ENCOUNTER — Other Ambulatory Visit (HOSPITAL_COMMUNITY): Payer: Self-pay | Admitting: *Deleted

## 2013-03-07 ENCOUNTER — Other Ambulatory Visit (HOSPITAL_COMMUNITY): Payer: Self-pay | Admitting: Adult Health

## 2013-03-07 ENCOUNTER — Ambulatory Visit (HOSPITAL_COMMUNITY): Payer: Self-pay | Admitting: *Deleted

## 2013-03-07 ENCOUNTER — Ambulatory Visit (HOSPITAL_BASED_OUTPATIENT_CLINIC_OR_DEPARTMENT_OTHER)
Admission: RE | Admit: 2013-03-07 | Discharge: 2013-03-07 | Disposition: A | Discharge status: Home / Self Care | Payer: Medicare Other | Source: Ambulatory Visit | Attending: Cardiology | Admitting: Cardiology

## 2013-03-07 ENCOUNTER — Telehealth (HOSPITAL_COMMUNITY): Payer: Self-pay | Admitting: Cardiology

## 2013-03-07 VITALS — Ht 63.0 in | Wt 150.0 lb

## 2013-03-07 DIAGNOSIS — T829XXA Unspecified complication of cardiac and vascular prosthetic device, implant and graft, initial encounter: Secondary | ICD-10-CM

## 2013-03-07 DIAGNOSIS — I5022 Chronic systolic (congestive) heart failure: Secondary | ICD-10-CM

## 2013-03-07 DIAGNOSIS — Z95818 Presence of other cardiac implants and grafts: Secondary | ICD-10-CM | POA: Insufficient documentation

## 2013-03-07 DIAGNOSIS — Z95811 Presence of heart assist device: Secondary | ICD-10-CM

## 2013-03-07 DIAGNOSIS — R5381 Other malaise: Secondary | ICD-10-CM | POA: Insufficient documentation

## 2013-03-07 DIAGNOSIS — Z7901 Long term (current) use of anticoagulants: Secondary | ICD-10-CM

## 2013-03-07 DIAGNOSIS — I472 Ventricular tachycardia, unspecified: Secondary | ICD-10-CM | POA: Insufficient documentation

## 2013-03-07 DIAGNOSIS — T827XXA Infection and inflammatory reaction due to other cardiac and vascular devices, implants and grafts, initial encounter: Secondary | ICD-10-CM

## 2013-03-07 DIAGNOSIS — I509 Heart failure, unspecified: Secondary | ICD-10-CM

## 2013-03-07 DIAGNOSIS — R531 Weakness: Secondary | ICD-10-CM

## 2013-03-07 DIAGNOSIS — D72829 Elevated white blood cell count, unspecified: Secondary | ICD-10-CM

## 2013-03-07 DIAGNOSIS — I4729 Other ventricular tachycardia: Secondary | ICD-10-CM | POA: Insufficient documentation

## 2013-03-07 DIAGNOSIS — T827XXS Infection and inflammatory reaction due to other cardiac and vascular devices, implants and grafts, sequela: Secondary | ICD-10-CM

## 2013-03-07 DIAGNOSIS — I50810 Right heart failure, unspecified: Secondary | ICD-10-CM

## 2013-03-07 LAB — CBC WITH DIFFERENTIAL/PLATELET
Basophils Absolute: 0 10*3/uL (ref 0.0–0.1)
Eosinophils Relative: 1 % (ref 0–5)
Hemoglobin: 11.3 g/dL — ABNORMAL LOW (ref 12.0–15.0)
Lymphocytes Relative: 10 % — ABNORMAL LOW (ref 12–46)
Lymphs Abs: 1.3 10*3/uL (ref 0.7–4.0)
MCH: 28.7 pg (ref 26.0–34.0)
MCHC: 32.8 g/dL (ref 30.0–36.0)
MCV: 87.3 fL (ref 78.0–100.0)
Neutro Abs: 11.6 10*3/uL — ABNORMAL HIGH (ref 1.7–7.7)
Neutrophils Relative %: 83 % — ABNORMAL HIGH (ref 43–77)
Platelets: 310 10*3/uL (ref 150–400)
RBC: 3.94 MIL/uL (ref 3.87–5.11)
RDW: 16.8 % — ABNORMAL HIGH (ref 11.5–15.5)
WBC: 14 10*3/uL — ABNORMAL HIGH (ref 4.0–10.5)

## 2013-03-07 LAB — BASIC METABOLIC PANEL
CO2: 18 mEq/L — ABNORMAL LOW (ref 19–32)
Calcium: 10.5 mg/dL (ref 8.4–10.5)
GFR calc Af Amer: 33 mL/min — ABNORMAL LOW (ref >90)
Glucose, Bld: 155 mg/dL — ABNORMAL HIGH (ref 70–99)
Potassium: 4.4 mEq/L (ref 3.5–5.1)
Sodium: 134 mEq/L — ABNORMAL LOW (ref 135–145)

## 2013-03-07 LAB — PROTIME-INR
INR: 1.85 — ABNORMAL HIGH (ref 0.00–1.49)
Prothrombin Time: 20.8 seconds — ABNORMAL HIGH (ref 11.6–15.2)

## 2013-03-07 LAB — LACTATE DEHYDROGENASE: LDH: 302 U/L — ABNORMAL HIGH (ref 94–250)

## 2013-03-07 NOTE — Progress Notes (Signed)
  Dressing change per VAD coordinator using sterile technique. Dressing removed, site cleansed with Chlora prep applicators x 2, dried, and guaze dressing with aquacel strip replaced. The site has near complete tissue ingrowth, no erythema, tenderness, drainage, or foul odor;  anchor device in place.  Pt denies fever or chills. Caregiver changing dressing every three days; will advance to every 4 days.    Pt/caregiver deny any alarms or VAD equipment issues. VAD coordinator reviewed daily log from home for daily temperature, weight, and VAD parameters. Pt is performing daily controller and system monitor self tests along with completing weekly and monthly maintenance for LVAD equipment.   LVAD equipment check completed and is in good working order.  Back-up equipment present.   LVAD education done on emergency procedures and precautions and reviewed exit site care.   Blood cultures x 2 at two sites drawn; CT chest and abdomen ordered for tomorrow followed by PICC placement in IR. Pt will start home IV antibiotics per [facility] - both pt and daughter verbalized understanding of same.  Biotronik rep here to interrogate ICD after patient reported possible ICD shock Sunday evening - interrogation revealed one appropriate ICD shock on 03/05/13 after 4 failed ATP attempts for fast VT which escalated to VF.  VF at 200 bpm terminated with 30 joules.

## 2013-03-07 NOTE — Telephone Encounter (Signed)
Per [facility] [phone] CT abd/chest cpt code 65784 Does not require prior auth per phone call ref # [id number]

## 2013-03-07 NOTE — Patient Instructions (Signed)
1.  No change in meds 2.  Return for CT chest and abdomen tomorrow at 9:30 am followed by PICC placement in Interventional Radiology tomorrow.  3.  Will contact [facility] with home IV antibiotic orders per Dr. Gala Romney. 4.  Pt to re-boot Biotronik home monitoring device and will call Biotronik contact # if any questions. Biotronik rep Harvie Heck) gave pt/daughter contact info for same. 5.  Will return to VAD clinic one week at 1pm.

## 2013-03-08 ENCOUNTER — Emergency Department (HOSPITAL_COMMUNITY): Payer: Medicare Other

## 2013-03-08 ENCOUNTER — Ambulatory Visit (HOSPITAL_COMMUNITY)
Admission: RE | Admit: 2013-03-08 | Discharge: 2013-03-08 | Disposition: A | Discharge status: Home / Self Care | Payer: Medicare Other | Source: Ambulatory Visit | Attending: Internal Medicine | Admitting: Internal Medicine

## 2013-03-08 ENCOUNTER — Inpatient Hospital Stay (HOSPITAL_COMMUNITY)
Admission: EM | Admit: 2013-03-08 | Discharge: 2013-03-23 | DRG: 287 | Disposition: A | Discharge status: Other Acute Inpatient Hospital | Payer: Medicare Other | Attending: Internal Medicine | Admitting: Internal Medicine

## 2013-03-08 ENCOUNTER — Encounter (HOSPITAL_COMMUNITY): Payer: Self-pay | Admitting: Emergency Medicine

## 2013-03-08 ENCOUNTER — Other Ambulatory Visit: Payer: Self-pay

## 2013-03-08 ENCOUNTER — Ambulatory Visit (HOSPITAL_COMMUNITY): Payer: Medicare Other

## 2013-03-08 ENCOUNTER — Ambulatory Visit (HOSPITAL_COMMUNITY)
Admission: RE | Admit: 2013-03-08 | Discharge: 2013-03-08 | Disposition: A | Discharge status: Home / Self Care | Payer: Medicare Other | Source: Ambulatory Visit | Attending: Adult Health | Admitting: Adult Health

## 2013-03-08 ENCOUNTER — Ambulatory Visit (HOSPITAL_COMMUNITY): Admission: RE | Admit: 2013-03-08 | Payer: Medicare Other | Source: Ambulatory Visit

## 2013-03-08 ENCOUNTER — Other Ambulatory Visit (HOSPITAL_COMMUNITY): Payer: Self-pay | Admitting: Adult Health

## 2013-03-08 ENCOUNTER — Other Ambulatory Visit (HOSPITAL_COMMUNITY): Payer: Commercial Managed Care - PPO

## 2013-03-08 DIAGNOSIS — I441 Atrioventricular block, second degree: Secondary | ICD-10-CM | POA: Diagnosis present

## 2013-03-08 DIAGNOSIS — T8140XA Infection following a procedure, unspecified, initial encounter: Secondary | ICD-10-CM

## 2013-03-08 DIAGNOSIS — I471 Supraventricular tachycardia, unspecified: Secondary | ICD-10-CM | POA: Diagnosis not present

## 2013-03-08 DIAGNOSIS — D72829 Elevated white blood cell count, unspecified: Secondary | ICD-10-CM

## 2013-03-08 DIAGNOSIS — Z7901 Long term (current) use of anticoagulants: Secondary | ICD-10-CM

## 2013-03-08 DIAGNOSIS — R5381 Other malaise: Secondary | ICD-10-CM

## 2013-03-08 DIAGNOSIS — Z7982 Long term (current) use of aspirin: Secondary | ICD-10-CM

## 2013-03-08 DIAGNOSIS — I517 Cardiomegaly: Secondary | ICD-10-CM | POA: Insufficient documentation

## 2013-03-08 DIAGNOSIS — N183 Chronic kidney disease, stage 3 unspecified: Secondary | ICD-10-CM | POA: Diagnosis present

## 2013-03-08 DIAGNOSIS — I708 Atherosclerosis of other arteries: Secondary | ICD-10-CM | POA: Insufficient documentation

## 2013-03-08 DIAGNOSIS — I5023 Acute on chronic systolic (congestive) heart failure: Secondary | ICD-10-CM

## 2013-03-08 DIAGNOSIS — T829XXA Unspecified complication of cardiac and vascular prosthetic device, implant and graft, initial encounter: Secondary | ICD-10-CM

## 2013-03-08 DIAGNOSIS — I428 Other cardiomyopathies: Secondary | ICD-10-CM

## 2013-03-08 DIAGNOSIS — Z95810 Presence of automatic (implantable) cardiac defibrillator: Secondary | ICD-10-CM

## 2013-03-08 DIAGNOSIS — R0602 Shortness of breath: Secondary | ICD-10-CM

## 2013-03-08 DIAGNOSIS — I959 Hypotension, unspecified: Secondary | ICD-10-CM

## 2013-03-08 DIAGNOSIS — E049 Nontoxic goiter, unspecified: Secondary | ICD-10-CM | POA: Insufficient documentation

## 2013-03-08 DIAGNOSIS — B999 Unspecified infectious disease: Secondary | ICD-10-CM | POA: Insufficient documentation

## 2013-03-08 DIAGNOSIS — Z86718 Personal history of other venous thrombosis and embolism: Secondary | ICD-10-CM

## 2013-03-08 DIAGNOSIS — I129 Hypertensive chronic kidney disease with stage 1 through stage 4 chronic kidney disease, or unspecified chronic kidney disease: Secondary | ICD-10-CM | POA: Diagnosis present

## 2013-03-08 DIAGNOSIS — E785 Hyperlipidemia, unspecified: Secondary | ICD-10-CM

## 2013-03-08 DIAGNOSIS — E876 Hypokalemia: Secondary | ICD-10-CM | POA: Diagnosis not present

## 2013-03-08 DIAGNOSIS — R570 Cardiogenic shock: Secondary | ICD-10-CM

## 2013-03-08 DIAGNOSIS — T827XXS Infection and inflammatory reaction due to other cardiac and vascular devices, implants and grafts, sequela: Secondary | ICD-10-CM

## 2013-03-08 DIAGNOSIS — I5022 Chronic systolic (congestive) heart failure: Principal | ICD-10-CM

## 2013-03-08 DIAGNOSIS — T827XXA Infection and inflammatory reaction due to other cardiac and vascular devices, implants and grafts, initial encounter: Secondary | ICD-10-CM | POA: Insufficient documentation

## 2013-03-08 DIAGNOSIS — Z113 Encounter for screening for infections with a predominantly sexual mode of transmission: Secondary | ICD-10-CM

## 2013-03-08 DIAGNOSIS — Y831 Surgical operation with implant of artificial internal device as the cause of abnormal reaction of the patient, or of later complication, without mention of misadventure at the time of the procedure: Secondary | ICD-10-CM | POA: Insufficient documentation

## 2013-03-08 DIAGNOSIS — I50810 Right heart failure, unspecified: Secondary | ICD-10-CM

## 2013-03-08 DIAGNOSIS — I4891 Unspecified atrial fibrillation: Secondary | ICD-10-CM | POA: Diagnosis present

## 2013-03-08 DIAGNOSIS — N179 Acute kidney failure, unspecified: Secondary | ICD-10-CM

## 2013-03-08 DIAGNOSIS — J90 Pleural effusion, not elsewhere classified: Secondary | ICD-10-CM | POA: Insufficient documentation

## 2013-03-08 DIAGNOSIS — I447 Left bundle-branch block, unspecified: Secondary | ICD-10-CM

## 2013-03-08 DIAGNOSIS — N189 Chronic kidney disease, unspecified: Secondary | ICD-10-CM

## 2013-03-08 DIAGNOSIS — F411 Generalized anxiety disorder: Secondary | ICD-10-CM

## 2013-03-08 DIAGNOSIS — Z515 Encounter for palliative care: Secondary | ICD-10-CM

## 2013-03-08 DIAGNOSIS — Z01812 Encounter for preprocedural laboratory examination: Secondary | ICD-10-CM

## 2013-03-08 DIAGNOSIS — I472 Ventricular tachycardia, unspecified: Secondary | ICD-10-CM

## 2013-03-08 DIAGNOSIS — R110 Nausea: Secondary | ICD-10-CM

## 2013-03-08 DIAGNOSIS — Z95811 Presence of heart assist device: Secondary | ICD-10-CM

## 2013-03-08 DIAGNOSIS — R791 Abnormal coagulation profile: Secondary | ICD-10-CM | POA: Diagnosis present

## 2013-03-08 DIAGNOSIS — R531 Weakness: Secondary | ICD-10-CM

## 2013-03-08 DIAGNOSIS — I442 Atrioventricular block, complete: Secondary | ICD-10-CM | POA: Diagnosis not present

## 2013-03-08 LAB — LACTATE DEHYDROGENASE: LDH: 299 U/L — ABNORMAL HIGH (ref 94–250)

## 2013-03-08 LAB — COMPREHENSIVE METABOLIC PANEL
ALT: 22 U/L (ref 0–35)
AST: 22 U/L (ref 0–37)
BUN: 28 mg/dL — ABNORMAL HIGH (ref 6–23)
CO2: 20 mEq/L (ref 19–32)
Calcium: 10.3 mg/dL (ref 8.4–10.5)
Creatinine, Ser: 2.31 mg/dL — ABNORMAL HIGH (ref 0.50–1.10)
GFR calc non Af Amer: 21 mL/min — ABNORMAL LOW (ref >90)
Total Protein: 7.8 g/dL (ref 6.0–8.3)

## 2013-03-08 LAB — CBC WITH DIFFERENTIAL/PLATELET
Basophils Absolute: 0 10*3/uL (ref 0.0–0.1)
Basophils Relative: 0 % (ref 0–1)
Eosinophils Absolute: 0.1 10*3/uL (ref 0.0–0.7)
Eosinophils Relative: 0 % (ref 0–5)
HCT: 32.7 % — ABNORMAL LOW (ref 36.0–46.0)
Lymphocytes Relative: 11 % — ABNORMAL LOW (ref 12–46)
MCHC: 32.1 g/dL (ref 30.0–36.0)
MCV: 87.4 fL (ref 78.0–100.0)
Monocytes Absolute: 0.8 10*3/uL (ref 0.1–1.0)
RDW: 17.1 % — ABNORMAL HIGH (ref 11.5–15.5)

## 2013-03-08 LAB — PROTIME-INR
INR: 2.24 — ABNORMAL HIGH (ref 0.00–1.49)
Prothrombin Time: 24.1 seconds — ABNORMAL HIGH (ref 11.6–15.2)

## 2013-03-08 MED ORDER — HYDRALAZINE HCL 25 MG PO TABS
25.0000 mg | ORAL_TABLET | Freq: Three times a day (TID) | ORAL | Status: DC
  Administered 2013-03-08 – 2013-03-12 (×11): 25 mg via ORAL
  Filled 2013-03-08 (×13): qty 1

## 2013-03-08 MED ORDER — ACETAMINOPHEN 325 MG PO TABS
650.0000 mg | ORAL_TABLET | Freq: Four times a day (QID) | ORAL | Status: DC | PRN
  Administered 2013-03-09 – 2013-03-10 (×2): 650 mg via ORAL
  Filled 2013-03-08 (×2): qty 2

## 2013-03-08 MED ORDER — VANCOMYCIN HCL IN DEXTROSE 1-5 GM/200ML-% IV SOLN
1000.0000 mg | INTRAVENOUS | Status: DC
  Administered 2013-03-08: 1000 mg via INTRAVENOUS
  Filled 2013-03-08 (×2): qty 200

## 2013-03-08 MED ORDER — PANTOPRAZOLE SODIUM 40 MG PO TBEC
40.0000 mg | DELAYED_RELEASE_TABLET | Freq: Every day | ORAL | Status: DC
  Administered 2013-03-09 – 2013-03-12 (×4): 40 mg via ORAL
  Filled 2013-03-08 (×4): qty 1

## 2013-03-08 MED ORDER — FE FUMARATE-B12-VIT C-FA-IFC PO CAPS
1.0000 | ORAL_CAPSULE | Freq: Three times a day (TID) | ORAL | Status: DC
  Administered 2013-03-09 – 2013-03-11 (×9): 1 via ORAL
  Filled 2013-03-08 (×13): qty 1

## 2013-03-08 MED ORDER — SIMVASTATIN 10 MG PO TABS
10.0000 mg | ORAL_TABLET | Freq: Every day | ORAL | Status: DC
  Administered 2013-03-08 – 2013-03-11 (×4): 10 mg via ORAL
  Filled 2013-03-08 (×6): qty 1

## 2013-03-08 MED ORDER — ONDANSETRON HCL 4 MG/2ML IJ SOLN
4.0000 mg | Freq: Once | INTRAMUSCULAR | Status: AC
  Administered 2013-03-08: 4 mg via INTRAVENOUS

## 2013-03-08 MED ORDER — CITALOPRAM HYDROBROMIDE 20 MG PO TABS
20.0000 mg | ORAL_TABLET | Freq: Every day | ORAL | Status: DC
  Administered 2013-03-09 – 2013-03-12 (×4): 20 mg via ORAL
  Filled 2013-03-08 (×4): qty 1

## 2013-03-08 MED ORDER — CEFTAZIDIME 1 G IJ SOLR
1.0000 g | Freq: Two times a day (BID) | INTRAMUSCULAR | Status: DC
  Administered 2013-03-08 – 2013-03-10 (×4): 1 g via INTRAVENOUS
  Filled 2013-03-08 (×5): qty 1

## 2013-03-08 MED ORDER — SILDENAFIL CITRATE 20 MG PO TABS
40.0000 mg | ORAL_TABLET | Freq: Three times a day (TID) | ORAL | Status: DC
  Administered 2013-03-08 – 2013-03-12 (×11): 40 mg via ORAL
  Filled 2013-03-08 (×13): qty 2

## 2013-03-08 MED ORDER — ASPIRIN EC 325 MG PO TBEC
325.0000 mg | DELAYED_RELEASE_TABLET | Freq: Every day | ORAL | Status: DC
  Administered 2013-03-09 – 2013-03-12 (×4): 325 mg via ORAL
  Filled 2013-03-08 (×4): qty 1

## 2013-03-08 MED ORDER — AMLODIPINE BESYLATE 10 MG PO TABS
10.0000 mg | ORAL_TABLET | Freq: Every day | ORAL | Status: DC
  Administered 2013-03-09 – 2013-03-12 (×4): 10 mg via ORAL
  Filled 2013-03-08 (×4): qty 1

## 2013-03-08 MED ORDER — AMIODARONE HCL 100 MG PO TABS
100.0000 mg | ORAL_TABLET | Freq: Every day | ORAL | Status: DC
  Administered 2013-03-09 – 2013-03-12 (×4): 100 mg via ORAL
  Filled 2013-03-08 (×4): qty 1

## 2013-03-08 MED ORDER — SPIRONOLACTONE 25 MG PO TABS
25.0000 mg | ORAL_TABLET | Freq: Every day | ORAL | Status: DC
  Administered 2013-03-09 – 2013-03-12 (×4): 25 mg via ORAL
  Filled 2013-03-08 (×4): qty 1

## 2013-03-08 MED ORDER — ONDANSETRON HCL 4 MG/2ML IJ SOLN: 4.0000 mg | INTRAMUSCULAR | Status: DC | PRN

## 2013-03-08 MED ORDER — DIGOXIN 0.0625 MG HALF TABLET
0.0625 mg | ORAL_TABLET | Freq: Every day | ORAL | Status: DC
  Administered 2013-03-09 – 2013-03-12 (×4): 0.0625 mg via ORAL
  Filled 2013-03-08 (×4): qty 1

## 2013-03-08 MED ORDER — ZOLPIDEM TARTRATE 5 MG PO TABS
5.0000 mg | ORAL_TABLET | Freq: Every evening | ORAL | Status: DC | PRN
  Administered 2013-03-09 – 2013-03-11 (×2): 5 mg via ORAL
  Filled 2013-03-08 (×2): qty 1

## 2013-03-08 MED ORDER — FUROSEMIDE 10 MG/ML IJ SOLN
40.0000 mg | Freq: Once | INTRAMUSCULAR | Status: AC
  Administered 2013-03-09: 40 mg via INTRAVENOUS
  Filled 2013-03-08 (×2): qty 4

## 2013-03-08 MED ORDER — POTASSIUM CHLORIDE CRYS ER 10 MEQ PO TBCR
10.0000 meq | EXTENDED_RELEASE_TABLET | Freq: Three times a day (TID) | ORAL | Status: DC
  Administered 2013-03-09 – 2013-03-12 (×11): 10 meq via ORAL
  Filled 2013-03-08 (×14): qty 1

## 2013-03-08 MED ORDER — ONDANSETRON HCL 4 MG/2ML IJ SOLN
INTRAMUSCULAR | Status: AC
  Filled 2013-03-08: qty 2

## 2013-03-08 MED ORDER — HYDRALAZINE HCL 20 MG/ML IJ SOLN: 10.0000 mg | Freq: Four times a day (QID) | INTRAMUSCULAR | Status: DC | PRN

## 2013-03-08 MED ORDER — RIFAMPIN 300 MG PO CAPS
600.0000 mg | ORAL_CAPSULE | Freq: Every day | ORAL | Status: DC
  Administered 2013-03-08: 600 mg via ORAL
  Filled 2013-03-08 (×2): qty 2

## 2013-03-08 NOTE — ED Notes (Signed)
Dr Gala Romney at bedside with PT

## 2013-03-08 NOTE — Progress Notes (Signed)
ANTICOAGULATION CONSULT NOTE - Initial Consult  Pharmacy Consult for Coumadin Indication: LVAD  No Known Allergies  Patient Measurements:   Total Body Wt:  68kg  Vital Signs: Temp: 99.3 F (37.4 C) (11/06 2148) Temp src: Oral (11/06 2148)  Labs:  Recent Labs  03/07/13 1307 03/08/13 2155  HGB 11.3* 10.5*  HCT 34.4* 32.7*  PLT 310 303  LABPROT 20.8* 24.1*  INR 1.85* 2.24*  CREATININE 1.81*  --     The CrCl is unknown because both a height and weight (above a minimum accepted value) are required for this calculation.   Medical History: Past Medical History  Diagnosis Date  . Paroxysmal supraventricular tachycardia   . Hypokalemia   . Chronic systolic heart failure     a. 09/6211 Echo: EF 15%, mod to sev antlat and inflat HK, inf AK, mild to mod MR, mildly/mod reduced RV fxn, Mod TR, PASP .  [name] History of DVT (deep vein thrombosis)   . Dyslipidemia   . HTN (hypertension)   . Nonischemic cardiomyopathy     a. 06/2012 Echo: EF 15%;  b. 06/2012 Cath: nl except luminal irregs in LAD.  [name] Ventricular tachycardia     a. s/p ICD  . Goiter 2001  . Implantable cardiac defibrillator- Biotronik 2009    Single chamber  . [name] (left bundle branch block)     intermittent  . Palliative care patient      Assessment: 67yof s/p LVAD implant 12/2012. She presents with possible empyema/LVAD infection, WBC 11, increased temp for her 98 ( usual 96), SOB and weakness. Her INR 2.24 this afternoon in clinic.  She has already taken her Coumadin dose for today.  She is being started on rifampin which may drop her INR.    Home dose Coumadin 5mg  MWF, 2.5mg  TTSS  Goal of Therapy:  INR 2-3 Monitor platelets by anticoagulation protocol: Yes   Plan:  Pt has already taken her Coumadin tonight prior to ED arrival Daily INR  Leota Sauers Pharm.D. CPP, BCPS Clinical Pharmacist [phone] 03/08/2013 10:31 PM

## 2013-03-08 NOTE — ED Provider Notes (Signed)
CSN: [id number]     Arrival date & time 03/08/13  2136 History   First MD Initiated Contact with Patient 03/08/13 2141     Chief Complaint  Patient presents with  . Shortness of Breath  . Weakness   (Consider location/radiation/quality/duration/timing/severity/associated sxs/prior Treatment) The history is provided by the patient, the EMS personnel and a relative. No language interpreter was used.  Patient is a 67 year old American female with past medical history of cardiomyopathy with an LVAD in place. Emergency department today with shortness of breath and nausea. The nausea has been present for approximately 2 months it is unchanged in character. She has had epigastric and chest discomfort over the past 2 months as well. She states it has not changed recently. See the CT of her chest and abdomen yesterday which demonstrated concerns for an empyema. She had a PICC line placed he was started on home IV antibiotics today. Today with the first dose for home IV antibiotic she expressed discomfort and made a gurgling sound. Her home health aid was concerned and sent her to the emergency department for evaluation. She has no fevers, chills, cough, diarrhea, or constipation. She denies pain.  Past Medical History  Diagnosis Date  . Paroxysmal supraventricular tachycardia   . Hypokalemia   . Chronic systolic heart failure     a. 0/1027 Echo: EF 15%, mod to sev antlat and inflat HK, inf AK, mild to mod MR, mildly/mod reduced RV fxn, Mod TR, PASP .  [name] History of DVT (deep vein thrombosis)   . Dyslipidemia   . HTN (hypertension)   . Nonischemic cardiomyopathy     a. 06/2012 Echo: EF 15%;  b. 06/2012 Cath: nl except luminal irregs in LAD.  [name] Ventricular tachycardia     a. s/p ICD  . Goiter 2001  . Implantable cardiac defibrillator- Biotronik 2009    Single chamber  . [name] (left bundle branch block)     intermittent  . Palliative care patient    Past Surgical History  Procedure Laterality  Date  . None    . Cardiac defibrillator placement  01/2009  . Insertion of implantable left ventricular assist device N/A 12/18/2012    Procedure: INSERTION OF IMPLANTABLE LEFT VENTRICULAR ASSIST DEVICE;  Surgeon: Kerin Perna, MD;  Location: [facility] OR;  Service: Open Heart Surgery;  Laterality: N/A;  . Intraoperative transesophageal echocardiogram N/A 12/18/2012    Procedure: INTRAOPERATIVE TRANSESOPHAGEAL ECHOCARDIOGRAM;  Surgeon: Kerin Perna, MD;  Location: [facility] OR;  Service: Open Heart Surgery;  Laterality: N/A;  . Tricuspid valve replacement N/A 12/18/2012    Procedure: TRICUSPID VALVE REPAIR;  Surgeon: Kerin Perna, MD;  Location: [facility] OR;  Service: Open Heart Surgery;  Laterality: N/A;  . Insertion of implantable left ventricular assist device N/A 12/18/2012    Procedure: INSERTION OF IMPLANTABLE RIGHT VENTRICULAR ASSIST DEVICE;  Surgeon: Kerin Perna, MD;  Location: [facility] OR;  Service: Open Heart Surgery;  Laterality: N/A;  . Removal of centrimag ventricular assist device N/A 12/25/2012    Procedure: REMOVAL OF CENTRIMAG VENTRICULAR ASSIST DEVICE;  Surgeon: Kerin Perna, MD;  Location: [facility] OR;  Service: Open Heart Surgery;  Laterality: N/A;  PUMP STANDBY  . Intraoperative transesophageal echocardiogram N/A 12/25/2012    Procedure: INTRAOPERATIVE TRANSESOPHAGEAL ECHOCARDIOGRAM;  Surgeon: Kerin Perna, MD;  Location: [facility] OR;  Service: Open Heart Surgery;  Laterality: N/A;  . Esophagogastroduodenoscopy N/A 12/26/2012    Procedure: ESOPHAGOGASTRODUODENOSCOPY (EGD);  Surgeon: Beverley Fiedler, MD;  Location: [facility] ENDOSCOPY;  Service: Gastroenterology;  Laterality: N/A;  Bedside  . Sternal closure N/A 12/27/2012    Procedure: STERNAL CLOSURE;  Surgeon: Kerin Perna, MD;  Location: [facility] OR;  Service: Thoracic;  Laterality: N/A;  . Mediastinal exploration N/A 12/27/2012    Procedure: MEDIASTINAL EXPLORATION;  Surgeon: Kerin Perna, MD;  Location: [facility] OR;  Service: Thoracic;  Laterality: N/A;   Family  History  Problem Relation Age of Onset  . Emphysema Mother   . Heart disease Mother   . Heart attack Father   . Emphysema Brother    History  Substance Use Topics  . Smoking status: Never Smoker   . Smokeless tobacco: Not on file  . Alcohol Use: No   OB History   Grav Para Term Preterm Abortions TAB SAB Ect Mult Living                 Review of Systems  Constitutional: Negative for fever and chills.  Respiratory: Positive for shortness of breath. Negative for cough, chest tightness and wheezing.   Cardiovascular: Negative for chest pain.  Gastrointestinal: Positive for nausea. Negative for vomiting, abdominal pain, diarrhea and constipation.  Genitourinary: Negative for dysuria, urgency and frequency.  Musculoskeletal: Negative for back pain and neck pain.  Neurological: Positive for weakness. Negative for dizziness, numbness and headaches.  All other systems reviewed and are negative.    Allergies  Review of patient's allergies indicates no known allergies.  Home Medications  No current outpatient prescriptions on file. Temp(Src) 99.3 F (37.4 C) (Oral) Physical Exam  Nursing note and vitals reviewed. Constitutional: She is oriented to person, place, and time. She appears well-developed and well-nourished. No distress.  HENT:  Head: Normocephalic and atraumatic.  Eyes: Pupils are equal, round, and reactive to light.  Neck: Normal range of motion.  Cardiovascular: Normal heart sounds and intact distal pulses.   Rhythmic LVAD hum heard throughout the precordium.    Pulmonary/Chest: Effort normal. No respiratory distress. She has no wheezes. She has no rhonchi. She has no rales. She exhibits no tenderness.  Abdominal: Soft. Bowel sounds are normal. She exhibits no distension. There is no tenderness. There is no rebound and no guarding.  Driveline to right abdomen, no warmth, no drainage.    Neurological: She is alert and oriented to person, place, and time. She has  normal strength. No cranial nerve deficit or sensory deficit. She exhibits normal muscle tone. Coordination and gait normal.  Skin: Skin is warm and dry.    ED Course  Procedures (including critical care time) Labs Review Labs Reviewed  CBC WITH DIFFERENTIAL - Abnormal; Notable for the following:    WBC 12.4 (*)    RBC 3.74 (*)    Hemoglobin 10.5 (*)    HCT 32.7 (*)    RDW 17.1 (*)    Neutrophils Relative % 82 (*)    Neutro Abs 10.1 (*)    Lymphocytes Relative 11 (*)    All other components within normal limits  COMPREHENSIVE METABOLIC PANEL - Abnormal; Notable for the following:    Glucose, Bld 157 (*)    BUN 28 (*)    Creatinine, Ser 2.31 (*)    GFR calc non Af Amer 21 (*)    GFR calc Af Amer 24 (*)    All other components within normal limits  PROTIME-INR - Abnormal; Notable for the following:    Prothrombin Time 24.1 (*)    INR 2.24 (*)    All other components within normal limits  LACTATE DEHYDROGENASE - Abnormal; Notable for the following:    LDH 299 (*)    All other components within normal limits  PRO B NATRIURETIC PEPTIDE - Abnormal; Notable for the following:    Pro B Natriuretic peptide (BNP) 2459.0 (*)    All other components within normal limits  PROTIME-INR  URINALYSIS, ROUTINE W REFLEX MICROSCOPIC  SEDIMENTATION RATE   Imaging Review Ct Abdomen Wo Contrast  03/08/2013   CLINICAL DATA:  LVAD pocket infection. Recent course of antibiotics without improvement.  EXAM: CT CHEST AND ABDOMEN WITHOUT CONTRAST  TECHNIQUE: Multidetector CT imaging of the chest and abdomen was performed following the standard protocol without intravenous contrast.  COMPARISON:  02/19/2013 and CT abdomen 12/14/2012.  FINDINGS: CT CHEST FINDINGS  Thyroid is rather uniformly enlarged. No pathologically enlarged mediastinal or axillary lymph nodes. Hilar regions are difficult to definitively evaluate without IV contrast. Left subclavian AICD lead terminates in the right ventricle and courses  through a tricuspid annuloplasty. Atherosclerotic calcification of the arterial vasculature. Heart is enlarged. Left ventricular assist device is in place and is unchanged in appearance from 02/19/2013. Lucency adjacent to the drive line in the subcutaneous tissues of the upper right abdomen is unchanged. No developing fluid collection or phlegmonous debris. No pericardial effusion.  Small right pleural effusion, decreased in size and simple in appearance. Small loculated left pleural effusion, unchanged. Scattered volume loss in the lungs bilaterally with slight improvement in right lung aeration in the interval. Airway is unremarkable.  CT ABDOMEN FINDINGS  Liver, gallbladder, adrenal glands and right kidney are unremarkable. There may be a sub cm low-attenuation lesion in the lower pole left kidney (image 61), too small to characterize. Possible punctate stone in the interpolar left kidney (image 57). The spleen and pancreas are unremarkable. A single linear metallic clip like structure is seen in the region of the gastroesophageal junction. Stomach and visualized bowel are otherwise unremarkable.  Minimal scattered atherosclerotic calcification of the arterial vasculature without abdominal aortic aneurysm. Retroperitoneal lymph nodes measure up to 10 mm in the left periaortic station, as before. No worrisome lytic or sclerotic lesions. Degenerative changes there is seen in the spine.  IMPRESSION: 1. LVAD is unchanged in appearance from 02/19/2013 and without associated abscess or phlegmon. Lucency adjacent to the drive line is unchanged as well. 2. Small loculated left pleural effusion, unchanged. Empyema can have this appearance. 3. Small right pleural effusion, decreased in size and simple in appearance. 4. Scattered volume loss in the lungs bilaterally with overall improvement in aeration in the right lung in the interval. 5. No acute findings in the abdomen. 6. Possible punctate left renal stone.    Electronically Signed   By: Leanna Battles M.D.   On: 03/08/2013 13:07   Dg Chest 2 View  03/08/2013   CLINICAL DATA:  67-year- female with shortness of breath and weakness. LVAD. Initial encounter.  EXAM: CHEST  2 VIEW  COMPARISON:  Chest CT from the same day and earlier.  FINDINGS: Small bilateral pleural effusions, better demonstrated on the earlier study today. LVAD remains in place. Left chest cardiac AICD. Stable cardiac size and mediastinal contours. Perihilar atelectasis. No pneumothorax or edema. Right PICC line in place. Visualized tracheal air column is within normal limits. No acute osseous abnormality identified.  IMPRESSION: Small bilateral pleural effusions and atelectasis. LVAD. See also chest CT from today reported separately.   Electronically Signed   By: Augusto Gamble M.D.   On: 03/08/2013 22:42   Ct Chest  Wo Contrast  03/08/2013   CLINICAL DATA:  LVAD pocket infection. Recent course of antibiotics without improvement.  EXAM: CT CHEST AND ABDOMEN WITHOUT CONTRAST  TECHNIQUE: Multidetector CT imaging of the chest and abdomen was performed following the standard protocol without intravenous contrast.  COMPARISON:  02/19/2013 and CT abdomen 12/14/2012.  FINDINGS: CT CHEST FINDINGS  Thyroid is rather uniformly enlarged. No pathologically enlarged mediastinal or axillary lymph nodes. Hilar regions are difficult to definitively evaluate without IV contrast. Left subclavian AICD lead terminates in the right ventricle and courses through a tricuspid annuloplasty. Atherosclerotic calcification of the arterial vasculature. Heart is enlarged. Left ventricular assist device is in place and is unchanged in appearance from 02/19/2013. Lucency adjacent to the drive line in the subcutaneous tissues of the upper right abdomen is unchanged. No developing fluid collection or phlegmonous debris. No pericardial effusion.  Small right pleural effusion, decreased in size and simple in appearance. Small loculated left  pleural effusion, unchanged. Scattered volume loss in the lungs bilaterally with slight improvement in right lung aeration in the interval. Airway is unremarkable.  CT ABDOMEN FINDINGS  Liver, gallbladder, adrenal glands and right kidney are unremarkable. There may be a sub cm low-attenuation lesion in the lower pole left kidney (image 61), too small to characterize. Possible punctate stone in the interpolar left kidney (image 57). The spleen and pancreas are unremarkable. A single linear metallic clip like structure is seen in the region of the gastroesophageal junction. Stomach and visualized bowel are otherwise unremarkable.  Minimal scattered atherosclerotic calcification of the arterial vasculature without abdominal aortic aneurysm. Retroperitoneal lymph nodes measure up to 10 mm in the left periaortic station, as before. No worrisome lytic or sclerotic lesions. Degenerative changes there is seen in the spine.  IMPRESSION: 1. LVAD is unchanged in appearance from 02/19/2013 and without associated abscess or phlegmon. Lucency adjacent to the drive line is unchanged as well. 2. Small loculated left pleural effusion, unchanged. Empyema can have this appearance. 3. Small right pleural effusion, decreased in size and simple in appearance. 4. Scattered volume loss in the lungs bilaterally with overall improvement in aeration in the right lung in the interval. 5. No acute findings in the abdomen. 6. Possible punctate left renal stone.   Electronically Signed   By: Leanna Battles M.D.   On: 03/08/2013 13:07   Ir Fluoro Guide Cv Line Right  03/08/2013   CLINICAL DATA:  S/p VAD implant, need longterm antibiotics possible infection.  EXAM: Single lumen power injectable PICC LINE PLACEMENT WITH ULTRASOUND AND FLUOROSCOPIC GUIDANCE  FLUOROSCOPY TIME:  36 seconds  PROCEDURE: The patient was advised of the possible risks and complications and agreed to undergo the procedure. The patient was then brought to the angiographic  suite for the procedure.  The right arm was prepped with chlorhexidine, draped in the usual sterile fashion using maximum barrier technique (cap and mask, sterile gown, sterile gloves, large sterile sheet, hand hygiene and cutaneous antisepsis) and infiltrated locally with 1% Lidocaine.  Ultrasound demonstrated patency of the right brachial vein, and this was documented with an image. Under real-time ultrasound guidance, this vein was accessed with a 21 gauge micropuncture needle and image documentation was performed. A 0.018 wire was introduced in to the vein. Over this, a 5 [place] single lumen power-injectable PICC was advanced to the lower SVC/right atrial junction. Fluoroscopy during the procedure and fluoro spot radiograph confirms appropriate catheter position. The catheter was flushed and covered with a sterile dressing.  Complications: none  IMPRESSION: Successful right arm Power injectable PICC line placement with ultrasound and fluoroscopic guidance. The catheter is ready for use.  Read By:  Pattricia Boss PA-C   Electronically Signed   By: Irish Lack M.D.   On: 03/08/2013 14:58   Ir US Guide Vasc Access Right  03/08/2013   CLINICAL DATA:  S/p VAD implant, need longterm antibiotics possible infection.  EXAM: Single lumen power injectable PICC LINE PLACEMENT WITH ULTRASOUND AND FLUOROSCOPIC GUIDANCE  FLUOROSCOPY TIME:  36 seconds  PROCEDURE: The patient was advised of the possible risks and complications and agreed to undergo the procedure. The patient was then brought to the angiographic suite for the procedure.  The right arm was prepped with chlorhexidine, draped in the usual sterile fashion using maximum barrier technique (cap and mask, sterile gown, sterile gloves, large sterile sheet, hand hygiene and cutaneous antisepsis) and infiltrated locally with 1% Lidocaine.  Ultrasound demonstrated patency of the right brachial vein, and this was documented with an image. Under real-time ultrasound  guidance, this vein was accessed with a 21 gauge micropuncture needle and image documentation was performed. A 0.018 wire was introduced in to the vein. Over this, a 5 [place] single lumen power-injectable PICC was advanced to the lower SVC/right atrial junction. Fluoroscopy during the procedure and fluoro spot radiograph confirms appropriate catheter position. The catheter was flushed and covered with a sterile dressing.  Complications: none  IMPRESSION: Successful right arm Power injectable PICC line placement with ultrasound and fluoroscopic guidance. The catheter is ready for use.  Read By:  Pattricia Boss PA-C   Electronically Signed   By: Irish Lack M.D.   On: 03/08/2013 14:58    EKG Interpretation     Ventricular Rate:    PR Interval:    QRS Duration:   QT Interval:    QTC Calculation:   R Axis:     Text Interpretation:              MDM  Patient is 67 year old Latin American female with past medical history cardiomyopathy with an elevated place who comes emergency department today with shortness of breath and nausea. Physical exam as above. Initial evaluation the emergency department she was evaluated by the LVAD team and physician. They felt the patient required admission to the hospital for further evaluation. There is a CBC, CMP, PT INR, LVH, BMP, and a chest x-ray be obtained. Patient was in stable condition upon evaluation emergency department. There were no obvious issues with the LVAD. With no fevers or chills doubt infection of the LVAD. BMP was elevated at 2400. LDH is elevated at 299. INR was 2.24. CMP had a creatinine of 2.31 otherwise unremarkable. CBC had WBC of 12.4 otherwise was unremarkable. Patient was admitted in stable condition. Labs and imaging reviewed by myself and considered and medical decision-making. Imaging was interpreted by radiology. Care discussed with my attending Dr. Jeraldine Loots.   1. LVAD (left ventricular assist device) present   2. Shortness of  breath   3. Nausea        Bethann Berkshire, MD 03/08/13 [phone]

## 2013-03-08 NOTE — ED Provider Notes (Signed)
I have seen the patient with the resident physician, Dr. Craige Cotta.  The documentation is an accurate reflection of the patient's ED presentation, with the following additions:  I have seen EKG, agree with the interpretation.    Patient has LVAD, evaluation was conducted concurrent with our cardiology colleagues.  Given the patient's ongoing complaints, she was admitted to have your service for further evaluation and management.  Gerhard Munch, MD 03/08/13 [phone]

## 2013-03-08 NOTE — ED Notes (Signed)
Manual systolic BP taken with Doppler of 102

## 2013-03-08 NOTE — Progress Notes (Signed)
ANTIBIOTIC CONSULT NOTE - INITIAL  Pharmacy Consult for Vancomycin / Elita Quick / Rifampin  Indication: Possible empyema / LVAD infection  No Known Allergies  Patient Measurements:   Total Body Weight: 68kg  Vital Signs: Temp: 99.3 F (37.4 C) (11/06 2148) Temp src: Oral (11/06 2148) Intake/Output from previous day:   Intake/Output from this shift:    Labs:  Recent Labs  03/07/13 1307  WBC 14.0*  HGB 11.3*  PLT 310  CREATININE 1.81*   The CrCl is unknown because both a height and weight (above a minimum accepted value) are required for this calculation. No results found for this basename: Rolm Gala, VANCORANDOM, GENTTROUGH, GENTPEAK, GENTRANDOM, TOBRATROUGH, TOBRAPEAK, TOBRARND, AMIKACINPEAK, AMIKACINTROU, AMIKACIN,  in the last 72 hours   Microbiology: Recent Results (from the past 720 hour(s))  URINE CULTURE     Status: None   Collection Time    02/11/13  9:24 PM      Result Value Range Status   Specimen Description URINE, RANDOM   Final   Special Requests ADDED 2143   Final   Culture  Setup Time     Final   Value: 02/11/2013 23:33     Performed at [facility]   Colony Count     Final   Value: 35,000 COLONIES/ML     Performed at [facility]   Culture     Final   Value: Multiple bacterial morphotypes present, none predominant. Suggest appropriate recollection if clinically indicated.     Performed at [facility]   Report Status 02/13/2013 FINAL   Final  CULTURE, BLOOD (ROUTINE X 2)     Status: None   Collection Time    03/07/13  3:00 PM      Result Value Range Status   Specimen Description BLOOD LEFT ARM   Final   Special Requests BOTTLES DRAWN AEROBIC AND ANAEROBIC 10CC   Final   Culture  Setup Time     Final   Value: 03/07/2013 20:28     Performed at [facility]   Culture     Final   Value:        BLOOD CULTURE RECEIVED NO GROWTH TO DATE CULTURE WILL BE HELD FOR 5 DAYS BEFORE ISSUING A FINAL NEGATIVE REPORT      Performed at [facility]   Report Status PENDING   Incomplete  CULTURE, BLOOD (ROUTINE X 2)     Status: None   Collection Time    03/07/13  3:08 PM      Result Value Range Status   Specimen Description BLOOD LEFT HAND   Final   Special Requests BOTTLES DRAWN AEROBIC AND ANAEROBIC 10CC   Final   Culture  Setup Time     Final   Value: 03/07/2013 20:29     Performed at [facility]   Culture     Final   Value:        BLOOD CULTURE RECEIVED NO GROWTH TO DATE CULTURE WILL BE HELD FOR 5 DAYS BEFORE ISSUING A FINAL NEGATIVE REPORT     Performed at [facility]   Report Status PENDING   Incomplete    Medical History: Past Medical History  Diagnosis Date  . Paroxysmal supraventricular tachycardia   . Hypokalemia   . Chronic systolic heart failure     a. 08/5407 Echo: EF 15%, mod to sev antlat and inflat HK, inf AK, mild to mod MR, mildly/mod reduced RV fxn, Mod TR, PASP  .  [name] History of DVT (deep vein thrombosis)   . Dyslipidemia   . HTN (hypertension)   . Nonischemic cardiomyopathy     a. 06/2012 Echo: EF 15%;  b. 06/2012 Cath: nl except luminal irregs in LAD.  [name] Ventricular tachycardia     a. s/p ICD  . Goiter 2001  . Implantable cardiac defibrillator- Biotronik 2009    Single chamber  . [name] (left bundle branch block)     intermittent  . Palliative care patient     Assessment: 67yof s/p LVAD implant 12/2012.  She presents with possible empyema/LVAD infection, WBC 11, increased temp for her 98 ( usual 96), SOB and weakness.  PICC line placed earlier today for home broad spectrum ABX.  She received 1 dose of Fortaz but no vancomycin or rifampin-  and continued to feel worse and came into ED.  Will continue Fortaz and Vancomycin and start rifampin.  ABX doses based on Cr 1.81 as of 11/5 with CrCl 45ml/min.   Goal of Therapy:  Vancomycin trough level 15-20 mcg/ml  Plan:  Vancomycin 1Gm IV q24 Fortaz 1gm IV q12 Rifampin 600mg  po q24hr  Leota Sauers Pharm.D. CPP, BCPS Clinical Pharmacist [phone] 03/08/2013 10:24 PM

## 2013-03-08 NOTE — Procedures (Signed)
Successful placement of single lumen power injectable PICC line to right brachial vein. Length 39cm Tip at lower SVC/RA No complications Ready for use.  Pattricia Boss PA-C Interventional Radiology  03/08/13  2:33 PM

## 2013-03-08 NOTE — ED Notes (Addendum)
Per family, pt has been having weakness and nausea today with increased this afternoon.  Pt had PICC line placed today for at home vancomycin treatments for infection of unknown location.  Pt has LVAD with the following readings: RPM       8600 Flow       4.5 PI           6.2 Power   4.6 Watts

## 2013-03-08 NOTE — H&P (Addendum)
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 Post-op couse c/b RHF requiring take back to the OR for bleeding and temproray RVAD support.   Admitted 10/12-10/14/14 for fatigue. Concern was for low output due to RHF. Echo with severe RV dysfunction and severe TR. However, RHC with relatively well preserved output.  RA = 17  RV = 35/2/12  PA = 35/15 (22)  PCW = 11  Fick cardiac output/index = 5.4/3.1  Thermo CO/CI = 3.7/2.1  PVR = 2.0 WU (Fick)  FA sat = assumed 98%  PA sat = 66%, 70%, 70%  RA sat = 76%   Has been struggling with persistent leukocytosis and weakness. Seen yesterday in Clinic. Complained of recent ICD shock and ICD interrogation showed one episode of VT treated with shock. Labs showed WBC 14k despite recent treatment with Keflex.   CT C/A/P showed persistent air around around driveline as well as small loculated R pleural effusion ? possible empyema. PICC placed today and started on abx vanc/ceftaz and rifampin.   Today felt nauseated all day (prior to abx). Also notes her temperature was higher than normal 98 degrees as opposed to 96. Tonight was getting ceftaz and became acutely SOB so called EMS. On arrival, now comforable. Denies orthopnea, PND. Weight stable.   VAD interrogation  RPM 8600 Flow 5.0L Power 4.8 PI 6.5  No PI events   Review of Systems: [y] = yes, [ ]  = no   General: Weight gain [ ] ; Weight loss [ ] ; Anorexia [ y]; Fatigue [name] ]; Fever [name] ]; Chills [ ] ; Weakness [name] ]  Cardiac: Chest pain/pressure [ ] ; Resting SOB [ y]; Exertional SOB [ ] ; Orthopnea [ ] ; Pedal Edema [ y]; Palpitations [ ] ; Syncope [ ] ; Presyncope [ ] ; Paroxysmal nocturnal dyspnea[ ]   Pulmonary: Cough [ ] ; Wheezing[ ] ; Hemoptysis[  ]; Sputum [ ] ; Snoring [ ]   GI: Vomiting[ ] ; Dysphagia[ ] ; Melena[ ] ; Hematochezia [ ] ; Heartburn[ ] ; Abdominal pain y[ ] ; Constipation [ ] ; Diarrhea [ ] ; BRBPR [ ]   GU: Hematuria[ ] ; Dysuria [ ] ; Nocturia[ ]   Vascular: Pain in legs with walking [ ] ; Pain in feet with lying flat [ ] ; Non-healing sores [ ] ; Stroke [ ] ; TIA [ ] ; Slurred speech [ ] ;  Neuro: Headaches[ ] ; Vertigo[ ] ; Seizures[ ] ; Paresthesias[ ] ;Blurred vision [ ] ; Diplopia [ ] ; Vision changes [ ]   Ortho/Skin: Arthritis [name] ]; Joint pain [ ] ; Muscle pain [ ] ; Joint swelling [ ] ; Back Pain [ ] ; Rash [ ]   Psych: Depression[ ] ; Anxiety[ ]   Heme: Bleeding problems [ ] ; Clotting disorders [ ] ; Anemia [ ]   Endocrine: Diabetes [ ] ; Thyroid dysfunction[ ]   Home Medications Prior to Admission medications   Medication Sig Start Date End Date Taking? Authorizing Provider  acetaminophen (TYLENOL) 325 MG tablet Take 650 mg by mouth every 6 (six) hours as needed for pain.    Historical Provider, MD  amiodarone (PACERONE) 200 MG tablet Take 0.5 tablets (100 mg total) by mouth daily. 02/01/13   Dolores Patty, MD  amLODipine (NORVASC) 5 MG tablet Take 2 tablets (10 mg total) by mouth daily. 01/24/13   Dolores Patty, MD  aspirin EC 325 MG EC tablet Take 1 tablet (325 mg total) by mouth daily. 01/18/13   Aundria Rud, NP  cephALEXin (KEFLEX) 500 MG capsule Take 1 capsule (500 mg total) by mouth 3 (three) times daily. 02/28/13   Aundria Rud, NP  citalopram (CELEXA) 20 MG tablet Take 1 tablet (20 mg total) by mouth daily. 02/21/13   Dolores Patty, MD  digoxin (LANOXIN) 0.125 MG tablet Take 0.5 tablets (0.0625 mg total) by mouth daily. 01/18/13   Aundria Rud, NP  enoxaparin (LOVENOX) 60 MG/0.6ML injection Inject 0.6 mLs (60 mg total) into the skin every 12 (twelve) hours. 02/28/13   Dolores Patty, MD  ferrous fumarate-b12-vitamic C-folic acid (TRINSICON / FOLTRIN) capsule Take 1 capsule by mouth 3 (three) times daily after meals.  01/18/13   Aundria Rud, NP  hydrALAZINE (APRESOLINE) 25 MG tablet Take 1 tablet (25 mg total) by mouth 3 (three) times daily. 02/07/13   Laurey Morale, MD  metolazone (ZAROXOLYN) 2.5 MG tablet Take 2.5 mg by mouth. As directed    Historical Provider, MD  pantoprazole (PROTONIX) 40 MG tablet Take 1 tablet (40 mg total) by mouth daily. 02/21/13   Dolores Patty, MD  potassium chloride (K-DUR,KLOR-CON) 10 MEQ tablet 10 mEq 3 (three) times daily. 02/15/13   Aundria Rud, NP  sildenafil (REVATIO) 20 MG tablet Take 2 tablets (40 mg total) by mouth 3 (three) times daily. 02/19/13   Dolores Patty, MD  simvastatin (ZOCOR) 10 MG tablet Take 1 tablet (10 mg total) by mouth at bedtime. 01/18/13   Aundria Rud, NP  spironolactone (ALDACTONE) 25 MG tablet Take 1 tablet (25 mg total) by mouth daily. 02/21/13   Dolores Patty, MD  torsemide (DEMADEX) 20 MG tablet 20 mg daily. 02/13/13   Aundria Rud, NP  warfarin (COUMADIN) 2.5 MG tablet Take 2.5 mg daily except 5 mg on MWFor as directed 03/05/13   Aundria Rud, NP  zolpidem (AMBIEN) 5 MG tablet Take 1 tablet (5 mg total) by mouth at bedtime as needed for sleep. 02/01/13   Dolores Patty, MD    Past Medical History: Past Medical History  Diagnosis Date  . Paroxysmal supraventricular tachycardia   . Hypokalemia   . Chronic systolic heart failure     a. 05/6107 Echo: EF 15%, mod to sev antlat and inflat HK, inf AK, mild to mod MR, mildly/mod reduced RV fxn, Mod TR, PASP .  [name] History of DVT (deep vein thrombosis)   . Dyslipidemia   . HTN (hypertension)   . Nonischemic cardiomyopathy     a. 06/2012 Echo: EF 15%;  b. 06/2012 Cath: nl except luminal irregs in LAD.  [name] Ventricular tachycardia     a. s/p ICD  . Goiter 2001  . Implantable cardiac defibrillator- Biotronik 2009    Single chamber  . [name] (left bundle branch block)     intermittent  . Palliative care patient     Past Surgical History: Past Surgical History   Procedure Laterality Date  . None    . Cardiac defibrillator placement  01/2009  . Insertion of implantable left ventricular assist device N/A 12/18/2012    Procedure: INSERTION OF IMPLANTABLE LEFT VENTRICULAR ASSIST DEVICE;  Surgeon: Kerin Perna, MD;  Location[facility];  Service: Open Heart Surgery;  Laterality: N/A;  . Intraoperative transesophageal echocardiogram N/A 12/18/2012    Procedure: INTRAOPERATIVE TRANSESOPHAGEAL ECHOCARDIOGRAM;  Surgeon: Kerin Perna, MD;  Location:  [facility] OR;  Service: Open Heart Surgery;  Laterality: N/A;  . Tricuspid valve replacement N/A 12/18/2012    Procedure: TRICUSPID VALVE REPAIR;  Surgeon: Kerin Perna, MD;  Location: [facility] OR;  Service: Open Heart Surgery;  Laterality: N/A;  . Insertion of implantable left ventricular assist device N/A 12/18/2012    Procedure: INSERTION OF IMPLANTABLE RIGHT VENTRICULAR ASSIST DEVICE;  Surgeon: Kerin Perna, MD;  [facility];  Service: Open Heart Surgery;  Laterality: N/A;  . Removal of centrimag ventricular assist device N/A 12/25/2012    Procedure: REMOVAL OF CENTRIMAG VENTRICULAR ASSIST DEVICE;  Surgeon: Kerin Perna, MD;  Location: [facility] OR;  Service: Open Heart Surgery;  Laterality: N/A;  PUMP STANDBY  . Intraoperative transesophageal echocardiogram N/A 12/25/2012    Procedure: INTRAOPERATIVE TRANSESOPHAGEAL ECHOCARDIOGRAM;  Surgeon: Kerin Perna, MD;  Location: [facility] OR;  Service: Open Heart Surgery;  Laterality: N/A;  . Esophagogastroduodenoscopy N/A 12/26/2012    Procedure: ESOPHAGOGASTRODUODENOSCOPY (EGD);  Surgeon: Beverley Fiedler, MD;  Location: [facility] ENDOSCOPY;  Service: Gastroenterology;  Laterality: N/A;  Bedside  . Sternal closure N/A 12/27/2012    Procedure: STERNAL CLOSURE;  Surgeon: Kerin Perna, MD;  Location: [facility] OR;  Service: Thoracic;  Laterality: N/A;  . Mediastinal exploration N/A 12/27/2012    Procedure: MEDIASTINAL EXPLORATION;  Surgeon: Kerin Perna, MD;  Location: [facility] OR;  Service: Thoracic;   Laterality: N/A;    Family History: Family History  Problem Relation Age of Onset  . Emphysema Mother   . Heart disease Mother   . Heart attack Father   . Emphysema Brother     Social History: History   Social History  . Marital Status: Single    Spouse Name: N/A    Number of Children: N/A  . Years of Education: N/A   Occupational History  . 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 Smoking status: Never Smoker   . Smokeless tobacco: None  . Alcohol Use: No  . Drug Use: No  . Sexual Activity: None   Other Topics Concern  . 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 Lives in [place], not married. 2 daughters/           Allergies:  No Known Allergies  Objective:    Vital Signs:   Temp:  [99.3 F (37.4 C)] 99.3 F (37.4 C) (11/06 2148)   There were no vitals filed for this visit.  Physical Exam: GENERAL: Fatigued appearing,no acute distress; daughter present  HEENT: normal  NECK: Supple, JVP 10 . 2+ bilaterally, no bruits. No lymphadenopathy or thyromegaly appreciated.  CARDIAC: Mechanical heart sounds with LVAD hum present.  LUNGS: Clear to auscultation bilaterally.  ABDOMEN: Soft, round, nontender, positive bowel sounds x4.  LVAD exit site: well-healed and incorporated. Dressing dry and intact. No erythema or drainage. Stabilization device present and accurately applied. No tenderness to palpation at all along driveline tract. EXTREMITIES: Warm to touch, no cyanosis, clubbing, rash. 1+ edema  NEUROLOGIC: Alert and oriented x 4. Gait steady. No aphasia. No dysarthria. Affect pleasant.     Labs: Basic Metabolic Panel:  Recent Labs Lab 03/07/13 1307  NA 134*  K 4.4  CL 98  CO2 18*  GLUCOSE 155*  BUN 22  CREATININE 1.81*  CALCIUM 10.5    Liver Function Tests: No results found for this basename: AST, ALT, ALKPHOS, BILITOT, PROT, ALBUMIN,  in the last 168 hours No  results  found for this basename: LIPASE, AMYLASE,  in the last 168 hours No results found for this basename: AMMONIA,  in the last 168 hours  CBC:  Recent Labs Lab 03/07/13 1307  WBC 14.0*  NEUTROABS 11.6*  HGB 11.3*  HCT 34.4*  MCV 87.3  PLT 310    Cardiac Enzymes: No results found for this basename: CKTOTAL, CKMB, CKMBINDEX, TROPONINI,  in the last 168 hours  BNP: BNP (last 3 results)  Recent Labs  02/19/13 1248 02/28/13 1423 03/07/13 1307  PROBNP 2309.0* 2290.0* 1875.0*    CBG: No results found for this basename: GLUCAP,  in the last 168 hours  Coagulation Studies:  Recent Labs  03/07/13 1307  LABPROT 20.8*  INR 1.85*    Other results: EKG: SR with Mobitz Type II HB.  Imaging: Ct Abdomen Wo Contrast  03/08/2013   CLINICAL DATA:  LVAD pocket infection. Recent course of antibiotics without improvement.  EXAM: CT CHEST AND ABDOMEN WITHOUT CONTRAST  TECHNIQUE: Multidetector CT imaging of the chest and abdomen was performed following the standard protocol without intravenous contrast.  COMPARISON:  02/19/2013 and CT abdomen 12/14/2012.  FINDINGS: CT CHEST FINDINGS  Thyroid is rather uniformly enlarged. No pathologically enlarged mediastinal or axillary lymph nodes. Hilar regions are difficult to definitively evaluate without IV contrast. Left subclavian AICD lead terminates in the right ventricle and courses through a tricuspid annuloplasty. Atherosclerotic calcification of the arterial vasculature. Heart is enlarged. Left ventricular assist device is in place and is unchanged in appearance from 02/19/2013. Lucency adjacent to the drive line in the subcutaneous tissues of the upper right abdomen is unchanged. No developing fluid collection or phlegmonous debris. No pericardial effusion.  Small right pleural effusion, decreased in size and simple in appearance. Small loculated left pleural effusion, unchanged. Scattered volume loss in the lungs bilaterally with slight improvement  in right lung aeration in the interval. Airway is unremarkable.  CT ABDOMEN FINDINGS  Liver, gallbladder, adrenal glands and right kidney are unremarkable. There may be a sub cm low-attenuation lesion in the lower pole left kidney (image 61), too small to characterize. Possible punctate stone in the interpolar left kidney (image 57). The spleen and pancreas are unremarkable. A single linear metallic clip like structure is seen in the region of the gastroesophageal junction. Stomach and visualized bowel are otherwise unremarkable.  Minimal scattered atherosclerotic calcification of the arterial vasculature without abdominal aortic aneurysm. Retroperitoneal lymph nodes measure up to 10 mm in the left periaortic station, as before. No worrisome lytic or sclerotic lesions. Degenerative changes there is seen in the spine.  IMPRESSION: 1. LVAD is unchanged in appearance from 02/19/2013 and without associated abscess or phlegmon. Lucency adjacent to the drive line is unchanged as well. 2. Small loculated left pleural effusion, unchanged. Empyema can have this appearance. 3. Small right pleural effusion, decreased in size and simple in appearance. 4. Scattered volume loss in the lungs bilaterally with overall improvement in aeration in the right lung in the interval. 5. No acute findings in the abdomen. 6. Possible punctate left renal stone.   Electronically Signed   By: Leanna Battles M.D.   On: 03/08/2013 13:07   Ct Chest Wo Contrast  03/08/2013   CLINICAL DATA:  LVAD pocket infection. Recent course of antibiotics without improvement.  EXAM: CT CHEST AND ABDOMEN WITHOUT CONTRAST  TECHNIQUE: Multidetector CT imaging of the chest and abdomen was performed following the standard protocol without intravenous contrast.  COMPARISON:  02/19/2013 and CT abdomen 12/14/2012.  FINDINGS: CT CHEST FINDINGS  Thyroid is rather uniformly enlarged. No pathologically enlarged mediastinal or axillary lymph nodes. Hilar regions are  difficult to definitively evaluate without IV contrast. Left subclavian AICD lead terminates in the right ventricle and courses through a tricuspid annuloplasty. Atherosclerotic calcification of the arterial vasculature. Heart is enlarged. Left ventricular assist device is in place and is unchanged in appearance from 02/19/2013. Lucency adjacent to the drive line in the subcutaneous tissues of the upper right abdomen is unchanged. No developing fluid collection or phlegmonous debris. No pericardial effusion.  Small right pleural effusion, decreased in size and simple in appearance. Small loculated left pleural effusion, unchanged. Scattered volume loss in the lungs bilaterally with slight improvement in right lung aeration in the interval. Airway is unremarkable.  CT ABDOMEN FINDINGS  Liver, gallbladder, adrenal glands and right kidney are unremarkable. There may be a sub cm low-attenuation lesion in the lower pole left kidney (image 61), too small to characterize. Possible punctate stone in the interpolar left kidney (image 57). The spleen and pancreas are unremarkable. A single linear metallic clip like structure is seen in the region of the gastroesophageal junction. Stomach and visualized bowel are otherwise unremarkable.  Minimal scattered atherosclerotic calcification of the arterial vasculature without abdominal aortic aneurysm. Retroperitoneal lymph nodes measure up to 10 mm in the left periaortic station, as before. No worrisome lytic or sclerotic lesions. Degenerative changes there is seen in the spine.  IMPRESSION: 1. LVAD is unchanged in appearance from 02/19/2013 and without associated abscess or phlegmon. Lucency adjacent to the drive line is unchanged as well. 2. Small loculated left pleural effusion, unchanged. Empyema can have this appearance. 3. Small right pleural effusion, decreased in size and simple in appearance. 4. Scattered volume loss in the lungs bilaterally with overall improvement in  aeration in the right lung in the interval. 5. No acute findings in the abdomen. 6. Possible punctate left renal stone.   Electronically Signed   By: Leanna Battles M.D.   On: 03/08/2013 13:07   Ir Fluoro Guide Cv Line Right  03/08/2013   CLINICAL DATA:  S/p VAD implant, need longterm antibiotics possible infection.  EXAM: Single lumen power injectable PICC LINE PLACEMENT WITH ULTRASOUND AND FLUOROSCOPIC GUIDANCE  FLUOROSCOPY TIME:  36 seconds  PROCEDURE: The patient was advised of the possible risks and complications and agreed to undergo the procedure. The patient was then brought to the angiographic suite for the procedure.  The right arm was prepped with chlorhexidine, draped in the usual sterile fashion using maximum barrier technique (cap and mask, sterile gown, sterile gloves, large sterile sheet, hand hygiene and cutaneous antisepsis) and infiltrated locally with 1% Lidocaine.  Ultrasound demonstrated patency of the right brachial vein, and this was documented with an image. Under real-time ultrasound guidance, this vein was accessed with a 21 gauge micropuncture needle and image documentation was performed. A 0.018 wire was introduced in to the vein. Over this, a 5 [place] single lumen power-injectable PICC was advanced to the lower SVC/right atrial junction. Fluoroscopy during the procedure and fluoro spot radiograph confirms appropriate catheter position. The catheter was flushed and covered with a sterile dressing.  Complications: none  IMPRESSION: Successful right arm Power injectable PICC line placement with ultrasound and fluoroscopic guidance. The catheter is ready for use.  Read By:  Pattricia Boss PA-C   Electronically Signed   By: Irish Lack M.D.   On: 03/08/2013 14:58   Ir US Guide Vasc Access Right  03/08/2013  CLINICAL DATA:  S/p VAD implant, need longterm antibiotics possible infection.  EXAM: Single lumen power injectable PICC LINE PLACEMENT WITH ULTRASOUND AND FLUOROSCOPIC  GUIDANCE  FLUOROSCOPY TIME:  36 seconds  PROCEDURE: The patient was advised of the possible risks and complications and agreed to undergo the procedure. The patient was then brought to the angiographic suite for the procedure.  The right arm was prepped with chlorhexidine, draped in the usual sterile fashion using maximum barrier technique (cap and mask, sterile gown, sterile gloves, large sterile sheet, hand hygiene and cutaneous antisepsis) and infiltrated locally with 1% Lidocaine.  Ultrasound demonstrated patency of the right brachial vein, and this was documented with an image. Under real-time ultrasound guidance, this vein was accessed with a 21 gauge micropuncture needle and image documentation was performed. A 0.018 wire was introduced in to the vein. Over this, a 5 [place] single lumen power-injectable PICC was advanced to the lower SVC/right atrial junction. Fluoroscopy during the procedure and fluoro spot radiograph confirms appropriate catheter position. The catheter was flushed and covered with a sterile dressing.  Complications: none  IMPRESSION: Successful right arm Power injectable PICC line placement with ultrasound and fluoroscopic guidance. The catheter is ready for use.  Read By:  Pattricia Boss PA-C   Electronically Signed   By: Irish Lack M.D.   On: 03/08/2013 14:58         Assessment:   1. Nausea 2. Dypnea 3. Chronic biventricular HF Status post HM-II LVAD implantation.  4. PAF  5. Recent VT s/p ICD shock on amio 6. R pleural effusion - ? Empyema 7. ? driveline infection 8. Leukocytosis 9. Mobitz II heart block 10. Chronic renal failure, stage III  Plan/Discussion:    She continues to struggle with symptoms of indolent infection. I am concerned that an R empyema cold be the source. Will admit for further work-up. Bcx drawn yesterday. Will review with Dr. Laneta Simmers - consider diagnostic tap vs drainage. Use Zofran for nausea. Continue broad spectrum abx. Check ESR. With  heart block on ECG endocarditis also needs to be considered.   Length of Stay: 0 Arvilla Meres 03/08/2013, 9:53 PM  Advanced Heart Failure Team Pager [phone] (M-F; 7a - 4p)  Please contact [facility] for night-coverage after hours (4p -7a ) and weekends on [email or web address]

## 2013-03-08 NOTE — Progress Notes (Signed)
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 Post-op couse c/b RHF requiring take back to the OR for bleeding and temproray RVAD support.  Admitted 10/12-10/14/14 for fatigue. Concern was for low output due to RHF. Echo with severe RV dysfunction and severe TR. However, RHC with relatively well preserved output.  RA = 17  RV = 35/2/12  PA = 35/15 (22)  PCW = 11  Fick cardiac output/index = 5.4/3.1  Thermo CO/CI = 3.7/2.1  PVR = 2.0 WU (Fick)  FA sat = assumed 98%  PA sat = 66%, 70%, 70%  RA sat = 76%  Recent CT scan of C/A/P relatively unremarkable except for gas around driveline but no obvious infection.   Follow up:  Returns for routine f/u. Denies orthopnea or CP. Still having mild DOE. Denies fevers. No bleeding episodes. No driveline discharge or problems with site. Occasional tightness across upper abdomen and lower chest.   Denies LVAD alarms.  ICD shock Sunday evening while in bed.   Reports taking Coumadin as prescribed and adherence to anticoagulation based dietary restrictions.  Denies bright red blood per rectum or melena, no dark urine or hematuria.    Symptom Yes No Details  Angina       x Activity:  Claudication       x How far:  Syncope       x When:   Stroke       x   Orthopnea       x How many pillows: one  PND       x How often:  CPAP       x How many hrs:  Pedal edema         x       Clears overnight  Abd fullness            x   N&V        x  fair appetite; gets full with small meals  Diaphoresis            x When:     Bleeding        x   Urine    light yellow  SOB      x  Activity:  Room to room   Palpitations             x   When:   ICD shock      x         11 /2/14 - ATP paced x 2 for FVT; ATP paced x 2 for VF followed by successful shock at 30 joules   Hospitlizaitons               x When/where/why:    ED visit         x When/where/why:  Other MD         x When/who/why:  Activity    Home PT/OT - one time weekly; fatigue is better; walking twice at [facility]  Fluid        < 1500   Diet     Low sodium     Past Medical History  Diagnosis Date  . Paroxysmal supraventricular tachycardia   . Hypokalemia   . Chronic systolic heart failure  a. 06/2012 Echo: EF 15%, mod to sev antlat and inflat HK, inf AK, mild to mod MR, mildly/mod reduced RV fxn, Mod TR, PASP .  [name] History of DVT (deep vein thrombosis)   . Dyslipidemia   . HTN (hypertension)   . Nonischemic cardiomyopathy     a. 06/2012 Echo: EF 15%;  b. 06/2012 Cath: nl except luminal irregs in LAD.  [name] Ventricular tachycardia     a. s/p ICD  . Goiter 2001  . Implantable cardiac defibrillator- Biotronik 2009    Single chamber  . [name] (left bundle branch block)     intermittent  . Palliative care patient     Current Outpatient Prescriptions  Medication Sig Dispense Refill  . acetaminophen (TYLENOL) 325 MG tablet Take 650 mg by mouth every 6 (six) hours as needed for pain.      [name] amiodarone (PACERONE) 200 MG tablet Take 0.5 tablets (100 mg total) by mouth daily.  30 tablet  6  . amLODipine (NORVASC) 5 MG tablet Take 2 tablets (10 mg total) by mouth daily.  60 tablet  6  . aspirin EC 325 MG EC tablet Take 1 tablet (325 mg total) by mouth daily.  30 tablet  0  . cephALEXin (KEFLEX) 500 MG capsule Take 1 capsule (500 mg total) by mouth 3 (three) times daily.  21 capsule  0  . citalopram (CELEXA) 20 MG tablet Take 1 tablet (20 mg total) by mouth daily.  90 tablet  3  . digoxin (LANOXIN) 0.125 MG tablet Take 0.5 tablets (0.0625 mg total) by mouth daily.  30 tablet  6  . ferrous fumarate-b12-vitamic C-folic acid (TRINSICON / FOLTRIN) capsule Take 1 capsule by mouth 3 (three) times daily after meals.  90 capsule  6  . hydrALAZINE (APRESOLINE) 25 MG tablet Take 1 tablet (25 mg total)  by mouth 3 (three) times daily.  90 tablet  6  . pantoprazole (PROTONIX) 40 MG tablet Take 1 tablet (40 mg total) by mouth daily.  90 tablet  3  . potassium chloride (K-DUR,KLOR-CON) 10 MEQ tablet 10 mEq 3 (three) times daily.      . sildenafil (REVATIO) 20 MG tablet Take 2 tablets (40 mg total) by mouth 3 (three) times daily.  720 tablet  2  . simvastatin (ZOCOR) 10 MG tablet Take 1 tablet (10 mg total) by mouth at bedtime.  90 tablet  3  . spironolactone (ALDACTONE) 25 MG tablet Take 1 tablet (25 mg total) by mouth daily.  90 tablet  3  . torsemide (DEMADEX) 20 MG tablet 20 mg daily.      [name] warfarin (COUMADIN) 2.5 MG tablet Take 2.5 mg daily except 5 mg on MWFor as directed  60 tablet  3  . enoxaparin (LOVENOX) 60 MG/0.6ML injection Inject 0.6 mLs (60 mg total) into the skin every 12 (twelve) hours.  10 Syringe  2  . metolazone (ZAROXOLYN) 2.5 MG tablet Take 2.5 mg by mouth. As directed      . zolpidem (AMBIEN) 5 MG tablet Take 1 tablet (5 mg total) by mouth at bedtime as needed for sleep.  30 tablet  2   No current facility-administered medications for this encounter.    Review of patient's allergies indicates no known allergies.  REVIEW OF SYSTEMS: All systems negative except as listed in HPI, PMH and Problem list.  LVAD INTERROGATION:  Speed:  8600 Flow:  4.9 Power:  4.8 PI: 5.8 Alarms:  noneI Events: few PI events Fixed speed:  8600 Low speed limit: 8000  I reviewed the LVAD parameters from today, and compared the results to the patient's prior recorded data.  No programming changes were made.  The LVAD is functioning within specified parameters.  LVAD interrogation was negative for any significant power changes, alarms or PI events/speed drops.     Physical Exam: Filed Vitals:   03/07/13 1310  Height: 5\' 3"  (1.6 m)  Weight: 150 lb (68.04 kg)  Last weight:  148 HR:  77 MAP:  84 O2 Sat:  98  GENERAL: Well appearing,no acute distress; daughter present HEENT: normal   NECK: Supple, JVP 8  .  2+ bilaterally, no bruits.  No lymphadenopathy or thyromegaly appreciated.   CARDIAC:  Mechanical heart sounds with LVAD hum present.  LUNGS:  Clear to auscultation bilaterally.  ABDOMEN:  Soft, round, nontender, positive bowel sounds x4.     LVAD exit site: well-healed and incorporated.  Dressing dry and intact.  No erythema or drainage.  Stabilization device present and accurately applied. No tenderness to palpation at all along driveline tract. Driveline dressing is being changed Q 3 days per sterile technique. EXTREMITIES:  Warm to touch, no cyanosis, clubbing, rash. Tr edema  NEUROLOGIC:  Alert and oriented x 4.  Gait steady.  No aphasia.  No dysarthria.  Affect pleasant.      CBC    Component Value Date/Time   WBC 14.0* 03/07/2013 1307   RBC 3.94 03/07/2013 1307   HGB 11.3* 03/07/2013 1307   HCT 34.4* 03/07/2013 1307   PLT 310 03/07/2013 1307   MCV 87.3 03/07/2013 1307   MCH 28.7 03/07/2013 1307   MCHC 32.8 03/07/2013 1307   RDW 16.8* 03/07/2013 1307   LYMPHSABS 1.3 03/07/2013 1307   MONOABS 1.0 03/07/2013 1307   EOSABS 0.1 03/07/2013 1307   BASOSABS 0.0 03/07/2013 1307    BMET    Component Value Date/Time   NA 134* 03/07/2013 1307   K 4.4 03/07/2013 1307   CL 98 03/07/2013 1307   CO2 18* 03/07/2013 1307   GLUCOSE 155* 03/07/2013 1307   BUN 22 03/07/2013 1307   CREATININE 1.81* 03/07/2013 1307   CALCIUM 10.5 03/07/2013 1307   GFRNONAA 28* 03/07/2013 1307   GFRAA 33* 03/07/2013 1307   IMPRESSION:  1. LVAD is unchanged in appearance from 02/19/2013 and without  associated abscess or phlegmon. Lucency adjacent to the drive line  is unchanged as well.  2. Small loculated left pleural effusion, unchanged. Empyema can  have this appearance.  3. Small right pleural effusion, decreased in size and simple in  appearance.  4. Scattered volume loss in the lungs bilaterally with overall  improvement in aeration in the right lung in the interval.  5. No acute findings  in the abdomen.  6. Possible punctate left renal stone.  ASSESSMENT AND PLAN:  1. Chronic systolic HF Status post HM-II LVAD implantation.  2. Right heart failure 3. PAF 4. Anticoagulation management - INR goal 2.0-3.0. INR 1.85 today 5. HTN - MAP 84 6. Insomnia  7. VT s/p ICD shock  She is somewhat improved but has persistent leukocytosis despite several rounds of antibiotics. We obtained CT and driveline looks unchanged - unclear if gas around line represents infection or not. There is also concern for left empyema. Will arrange for PICC line and home IV abx including: vancomycin, rifampin and fortaz. Will need to discuss drainage of possible empyema with Dr. Donata Clay. VAD parameters otherwise lok good.   ICD interrogated personally  with Biotronik rep and confirms VT with shock. Will check electrolytes. Continue amiodarone.  Adjust coumadin as INR low and will drop with rifampin.    Kaitlin [name]  9:10 PM

## 2013-03-09 DIAGNOSIS — I369 Nonrheumatic tricuspid valve disorder, unspecified: Secondary | ICD-10-CM

## 2013-03-09 DIAGNOSIS — Z95818 Presence of other cardiac implants and grafts: Secondary | ICD-10-CM

## 2013-03-09 DIAGNOSIS — T827XXA Infection and inflammatory reaction due to other cardiac and vascular devices, implants and grafts, initial encounter: Secondary | ICD-10-CM

## 2013-03-09 DIAGNOSIS — I509 Heart failure, unspecified: Secondary | ICD-10-CM

## 2013-03-09 DIAGNOSIS — Z95810 Presence of automatic (implantable) cardiac defibrillator: Secondary | ICD-10-CM

## 2013-03-09 DIAGNOSIS — R110 Nausea: Secondary | ICD-10-CM

## 2013-03-09 DIAGNOSIS — I428 Other cardiomyopathies: Secondary | ICD-10-CM

## 2013-03-09 LAB — COMPREHENSIVE METABOLIC PANEL
ALT: 19 U/L (ref 0–35)
AST: 20 U/L (ref 0–37)
Albumin: 4 g/dL (ref 3.5–5.2)
Alkaline Phosphatase: 86 U/L (ref 39–117)
CO2: 25 mEq/L (ref 19–32)
Chloride: 98 mEq/L (ref 96–112)
Creatinine, Ser: 1.66 mg/dL — ABNORMAL HIGH (ref 0.50–1.10)
GFR calc non Af Amer: 31 mL/min — ABNORMAL LOW (ref >90)
Potassium: 3.8 mEq/L (ref 3.5–5.1)
Sodium: 135 mEq/L (ref 135–145)
Total Protein: 7 g/dL (ref 6.0–8.3)

## 2013-03-09 LAB — URINALYSIS, ROUTINE W REFLEX MICROSCOPIC
Glucose, UA: NEGATIVE mg/dL
Hgb urine dipstick: NEGATIVE
Ketones, ur: NEGATIVE mg/dL
Nitrite: NEGATIVE
pH: 6.5 (ref 5.0–8.0)

## 2013-03-09 LAB — CBC
HCT: 29.4 % — ABNORMAL LOW (ref 36.0–46.0)
MCH: 28.2 pg (ref 26.0–34.0)
MCHC: 32.3 g/dL (ref 30.0–36.0)
MCV: 87.2 fL (ref 78.0–100.0)
Platelets: 257 10*3/uL (ref 150–400)
RBC: 3.37 MIL/uL — ABNORMAL LOW (ref 3.87–5.11)
RDW: 16.9 % — ABNORMAL HIGH (ref 11.5–15.5)
WBC: 9.7 10*3/uL (ref 4.0–10.5)

## 2013-03-09 LAB — PROTIME-INR
INR: 2.32 — ABNORMAL HIGH (ref 0.00–1.49)
Prothrombin Time: 24.7 seconds — ABNORMAL HIGH (ref 11.6–15.2)

## 2013-03-09 LAB — URINE MICROSCOPIC-ADD ON

## 2013-03-09 LAB — SEDIMENTATION RATE: Sed Rate: 16 mm/hr (ref 0–22)

## 2013-03-09 MED ORDER — VANCOMYCIN HCL IN DEXTROSE 750-5 MG/150ML-% IV SOLN
750.0000 mg | INTRAVENOUS | Status: DC
  Filled 2013-03-09: qty 150

## 2013-03-09 MED ORDER — BOOST / RESOURCE BREEZE PO LIQD
1.0000 | Freq: Every day | ORAL | Status: DC
  Administered 2013-03-10 – 2013-03-11 (×2): 1 via ORAL

## 2013-03-09 MED ORDER — WARFARIN SODIUM 5 MG PO TABS
5.0000 mg | ORAL_TABLET | Freq: Once | ORAL | Status: AC
  Administered 2013-03-09: 5 mg via ORAL
  Filled 2013-03-09: qty 1

## 2013-03-09 MED ORDER — WARFARIN - PHARMACIST DOSING INPATIENT
Freq: Every day | Status: DC
  Administered 2013-03-12 – 2013-03-20 (×6)

## 2013-03-09 MED ORDER — VANCOMYCIN HCL IN DEXTROSE 1-5 GM/200ML-% IV SOLN
1000.0000 mg | INTRAVENOUS | Status: DC
  Administered 2013-03-09: 1000 mg via INTRAVENOUS
  Filled 2013-03-09 (×2): qty 200

## 2013-03-09 NOTE — Progress Notes (Addendum)
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 Post-op couse c/b RHF requiring take back to the OR for bleeding and temproray RVAD support.   Admitted 10/12-10/14/14 for fatigue. Concern was for low output due to RHF. Echo with severe RV dysfunction and severe TR. However, RHC with relatively well preserved output.  RA = 17  RV = 35/2/12  PA = 35/15 (22)  PCW = 11  Fick cardiac output/index = 5.4/3.1  Thermo CO/CI = 3.7/2.1  PVR = 2.0 WU (Fick)  FA sat = assumed 98%  PA sat = 66%, 70%, 70%  RA sat = 76%    CT C/A/P showed persistent air around around driveline as well as small loculated R pleural effusion ? possible empyema. PICC placed and started on abx vanc/ceftaz and rifampin.   Admitted 11/6 with nausea. Now feels better. Intermittent CHB overnight. ESR 15. Denies dyspnea or orthopnea. No fevers or chills  VAD interrogation  RPM 8600 Flow 5.5L Power 4.8 PI 5.7 No PI events    Objective:    Vital Signs:   Temp:  [98 F (36.7 C)-99.3 F (37.4 C)] 98.5 F (36.9 C) (11/07 0826) Pulse Rate:  [45-80] 76 (11/07 0700) Resp:  [18-25] 19 (11/07 0700) SpO2:  [90 %-97 %] 94 % (11/07 0700) Weight:  [68.2 kg (150 lb 5.7 oz)] 68.2 kg (150 lb 5.7 oz) (11/07 0500)   Filed Weights   03/09/13 0500  Weight: 68.2 kg (150 lb 5.7 oz)    Physical Exam: GENERAL: Fatigued appearing,no acute distress; daughter present  HEENT: normal  NECK: Supple, JVP 9-10 . 2+ bilaterally, no bruits. No lymphadenopathy or thyromegaly appreciated.  CARDIAC: Mechanical heart sounds with LVAD hum present.  LUNGS: Clear to auscultation bilaterally.  ABDOMEN: Soft, round, nontender, positive bowel sounds x4.  LVAD exit site: well-healed and  incorporated. Dressing dry and intact. No erythema or drainage. Stabilization device present and accurately applied. No tenderness to palpation at all along driveline tract.  EXTREMITIES:  no cyanosis, clubbing, rash. tr edema  NEUROLOGIC: Alert and oriented x 4. Gait steady. No aphasia. No dysarthria. Affect pleasant.     Labs: Basic Metabolic Panel:  Recent Labs Lab 03/07/13 1307 03/08/13 2155  NA 134* 136  K 4.4 5.0  CL 98 99  CO2 18* 20  GLUCOSE 155* 157*  BUN 22 28*  CREATININE 1.81* 2.31*  CALCIUM 10.5 10.3    Liver Function Tests:  Recent Labs Lab 03/08/13 2155  AST 22  ALT 22  ALKPHOS 104  BILITOT 0.8  PROT 7.8  ALBUMIN 4.6   No results found for this basename: LIPASE, AMYLASE,  in the last 168 hours No results found for this basename: AMMONIA,  in the last 168 hours  CBC:  Recent Labs Lab 03/07/13 1307 03/08/13 2155  WBC 14.0* 12.4*  NEUTROABS 11.6* 10.1*  HGB 11.3* 10.5*  HCT 34.4* 32.7*  MCV 87.3 87.4  PLT 310 303    Cardiac Enzymes: No results found for this basename: CKTOTAL, CKMB, CKMBINDEX, TROPONINI,  in the last 168 hours  BNP: BNP (last 3 results)  Recent Labs  02/28/13 1423 03/07/13 1307 03/08/13 2155  PROBNP 2290.0* 1875.0* 2459.0*  CBG: No results found for this basename: GLUCAP,  in the last 168 hours  Coagulation Studies:  Recent Labs  03/07/13 1307 03/08/13 2155 03/09/13 0425  LABPROT 20.8* 24.1* 24.7*  INR 1.85* 2.24* 2.32*    Other results: EKG: SR with Mobitz Type II HB.  Imaging: Ct Abdomen Wo Contrast  03/08/2013   CLINICAL DATA:  LVAD pocket infection. Recent course of antibiotics without improvement.  EXAM: CT CHEST AND ABDOMEN WITHOUT CONTRAST  TECHNIQUE: Multidetector CT imaging of the chest and abdomen was performed following the standard protocol without intravenous contrast.  COMPARISON:  02/19/2013 and CT abdomen 12/14/2012.  FINDINGS: CT CHEST FINDINGS  Thyroid is rather uniformly enlarged. No  pathologically enlarged mediastinal or axillary lymph nodes. Hilar regions are difficult to definitively evaluate without IV contrast. Left subclavian AICD lead terminates in the right ventricle and courses through a tricuspid annuloplasty. Atherosclerotic calcification of the arterial vasculature. Heart is enlarged. Left ventricular assist device is in place and is unchanged in appearance from 02/19/2013. Lucency adjacent to the drive line in the subcutaneous tissues of the upper right abdomen is unchanged. No developing fluid collection or phlegmonous debris. No pericardial effusion.  Small right pleural effusion, decreased in size and simple in appearance. Small loculated left pleural effusion, unchanged. Scattered volume loss in the lungs bilaterally with slight improvement in right lung aeration in the interval. Airway is unremarkable.  CT ABDOMEN FINDINGS  Liver, gallbladder, adrenal glands and right kidney are unremarkable. There may be a sub cm low-attenuation lesion in the lower pole left kidney (image 61), too small to characterize. Possible punctate stone in the interpolar left kidney (image 57). The spleen and pancreas are unremarkable. A single linear metallic clip like structure is seen in the region of the gastroesophageal junction. Stomach and visualized bowel are otherwise unremarkable.  Minimal scattered atherosclerotic calcification of the arterial vasculature without abdominal aortic aneurysm. Retroperitoneal lymph nodes measure up to 10 mm in the left periaortic station, as before. No worrisome lytic or sclerotic lesions. Degenerative changes there is seen in the spine.  IMPRESSION: 1. LVAD is unchanged in appearance from 02/19/2013 and without associated abscess or phlegmon. Lucency adjacent to the drive line is unchanged as well. 2. Small loculated left pleural effusion, unchanged. Empyema can have this appearance. 3. Small right pleural effusion, decreased in size and simple in appearance. 4.  Scattered volume loss in the lungs bilaterally with overall improvement in aeration in the right lung in the interval. 5. No acute findings in the abdomen. 6. Possible punctate left renal stone.   Electronically Signed   By: Leanna Battles M.D.   On: 03/08/2013 13:07   Dg Chest 2 View  03/08/2013   CLINICAL DATA:  67-year- female with shortness of breath and weakness. LVAD. Initial encounter.  EXAM: CHEST  2 VIEW  COMPARISON:  Chest CT from the same day and earlier.  FINDINGS: Small bilateral pleural effusions, better demonstrated on the earlier study today. LVAD remains in place. Left chest cardiac AICD. Stable cardiac size and mediastinal contours. Perihilar atelectasis. No pneumothorax or edema. Right PICC line in place. Visualized tracheal air column is within normal limits. No acute osseous abnormality identified.  IMPRESSION: Small bilateral pleural effusions and atelectasis. LVAD. See also chest CT from today reported separately.   Electronically Signed   By: Augusto Gamble M.D.   On: 03/08/2013 22:42   Ct Chest Wo Contrast  03/08/2013   CLINICAL DATA:  LVAD pocket infection. Recent course of antibiotics without  improvement.  EXAM: CT CHEST AND ABDOMEN WITHOUT CONTRAST  TECHNIQUE: Multidetector CT imaging of the chest and abdomen was performed following the standard protocol without intravenous contrast.  COMPARISON:  02/19/2013 and CT abdomen 12/14/2012.  FINDINGS: CT CHEST FINDINGS  Thyroid is rather uniformly enlarged. No pathologically enlarged mediastinal or axillary lymph nodes. Hilar regions are difficult to definitively evaluate without IV contrast. Left subclavian AICD lead terminates in the right ventricle and courses through a tricuspid annuloplasty. Atherosclerotic calcification of the arterial vasculature. Heart is enlarged. Left ventricular assist device is in place and is unchanged in appearance from 02/19/2013. Lucency adjacent to the drive line in the subcutaneous tissues of the upper right  abdomen is unchanged. No developing fluid collection or phlegmonous debris. No pericardial effusion.  Small right pleural effusion, decreased in size and simple in appearance. Small loculated left pleural effusion, unchanged. Scattered volume loss in the lungs bilaterally with slight improvement in right lung aeration in the interval. Airway is unremarkable.  CT ABDOMEN FINDINGS  Liver, gallbladder, adrenal glands and right kidney are unremarkable. There may be a sub cm low-attenuation lesion in the lower pole left kidney (image 61), too small to characterize. Possible punctate stone in the interpolar left kidney (image 57). The spleen and pancreas are unremarkable. A single linear metallic clip like structure is seen in the region of the gastroesophageal junction. Stomach and visualized bowel are otherwise unremarkable.  Minimal scattered atherosclerotic calcification of the arterial vasculature without abdominal aortic aneurysm. Retroperitoneal lymph nodes measure up to 10 mm in the left periaortic station, as before. No worrisome lytic or sclerotic lesions. Degenerative changes there is seen in the spine.  IMPRESSION: 1. LVAD is unchanged in appearance from 02/19/2013 and without associated abscess or phlegmon. Lucency adjacent to the drive line is unchanged as well. 2. Small loculated left pleural effusion, unchanged. Empyema can have this appearance. 3. Small right pleural effusion, decreased in size and simple in appearance. 4. Scattered volume loss in the lungs bilaterally with overall improvement in aeration in the right lung in the interval. 5. No acute findings in the abdomen. 6. Possible punctate left renal stone.   Electronically Signed   By: Leanna Battles M.D.   On: 03/08/2013 13:07   Ir Fluoro Guide Cv Line Right  03/08/2013   CLINICAL DATA:  S/p VAD implant, need longterm antibiotics possible infection.  EXAM: Single lumen power injectable PICC LINE PLACEMENT WITH ULTRASOUND AND FLUOROSCOPIC  GUIDANCE  FLUOROSCOPY TIME:  36 seconds  PROCEDURE: The patient was advised of the possible risks and complications and agreed to undergo the procedure. The patient was then brought to the angiographic suite for the procedure.  The right arm was prepped with chlorhexidine, draped in the usual sterile fashion using maximum barrier technique (cap and mask, sterile gown, sterile gloves, large sterile sheet, hand hygiene and cutaneous antisepsis) and infiltrated locally with 1% Lidocaine.  Ultrasound demonstrated patency of the right brachial vein, and this was documented with an image. Under real-time ultrasound guidance, this vein was accessed with a 21 gauge micropuncture needle and image documentation was performed. A 0.018 wire was introduced in to the vein. Over this, a 5 [place] single lumen power-injectable PICC was advanced to the lower SVC/right atrial junction. Fluoroscopy during the procedure and fluoro spot radiograph confirms appropriate catheter position. The catheter was flushed and covered with a sterile dressing.  Complications: none  IMPRESSION: Successful right arm Power injectable PICC line placement with ultrasound and fluoroscopic guidance. The catheter is  ready for use.  Read By:  Pattricia Boss PA-C   Electronically Signed   By: Irish Lack M.D.   On: 03/08/2013 14:58   Ir US Guide Vasc Access Right  03/08/2013   CLINICAL DATA:  S/p VAD implant, need longterm antibiotics possible infection.  EXAM: Single lumen power injectable PICC LINE PLACEMENT WITH ULTRASOUND AND FLUOROSCOPIC GUIDANCE  FLUOROSCOPY TIME:  36 seconds  PROCEDURE: The patient was advised of the possible risks and complications and agreed to undergo the procedure. The patient was then brought to the angiographic suite for the procedure.  The right arm was prepped with chlorhexidine, draped in the usual sterile fashion using maximum barrier technique (cap and mask, sterile gown, sterile gloves, large sterile sheet, hand  hygiene and cutaneous antisepsis) and infiltrated locally with 1% Lidocaine.  Ultrasound demonstrated patency of the right brachial vein, and this was documented with an image. Under real-time ultrasound guidance, this vein was accessed with a 21 gauge micropuncture needle and image documentation was performed. A 0.018 wire was introduced in to the vein. Over this, a 5 [place] single lumen power-injectable PICC was advanced to the lower SVC/right atrial junction. Fluoroscopy during the procedure and fluoro spot radiograph confirms appropriate catheter position. The catheter was flushed and covered with a sterile dressing.  Complications: none  IMPRESSION: Successful right arm Power injectable PICC line placement with ultrasound and fluoroscopic guidance. The catheter is ready for use.  Read By:  Pattricia Boss PA-C   Electronically Signed   By: Irish Lack M.D.   On: 03/08/2013 14:58        Assessment:   1. Nausea 2. Dypnea 3. Chronic biventricular HF Status post HM-II LVAD implantation.  4. PAF  5. Recent VT s/p ICD shock on amio 6. R pleural effusion - ? Empyema 7. ? driveline infection 8. Leukocytosis 9. Transient CHB 10. Chronic renal failure, stage III  Plan/Discussion:    She feels better this am but overall I am unsure why she continues to struggle. Differential has been infection (driveline vs pleural effusion) but neither are convincing sources at this point as well as RHF but recent cath ok sdespite appearance on echo.  I have reviewed CT with Dr. Laneta Simmers and R effusion not felt to be significant. Will discuss discontinuation of abx. Post-percardiotomy syndrome less likely with ESR 15.   Check labs today. Continue coumadin and ambulation. Watch cx.  Unsure why she has CHB. Will check echo.   Length of Stay: 1 Arvilla Meres 03/09/2013, 8:31 AM  Advanced Heart Failure Team Pager [phone] (M-F; 7a - 4p)  Please contact [facility] for night-coverage after hours  (4p -7a ) and weekends on [email or web address]  Addendum:  Will get labs today. If Cr > 2.0 can consider milrinone for renal support.   May need to consider TEE to exclude ICD infection.  Daniel Bensimhon,MD 9:32 AM

## 2013-03-09 NOTE — Progress Notes (Signed)
ANTICOAGULATION CONSULT NOTE   Pharmacy Consult for Coumadin Indication: LVAD  No Known Allergies  Patient Measurements: Weight: 150 lb 5.7 oz (68.2 kg) Total Body Wt:  68kg  Vital Signs: Temp: 98.5 F (36.9 C) (11/07 0826) Temp src: Oral (11/07 0826) Pulse Rate: 77 (11/07 0900)  Labs:  Recent Labs  03/07/13 1307 03/08/13 2155 03/09/13 0425  HGB 11.3* 10.5*  --   HCT 34.4* 32.7*  --   PLT 310 303  --   LABPROT 20.8* 24.1* 24.7*  INR 1.85* 2.24* 2.32*  CREATININE 1.81* 2.31*  --     The CrCl is unknown because both a height and weight (above a minimum accepted value) are required for this calculation.  Assessment: Kaitlin [name] s/p LVAD implant 12/2012. She presents with possible empyema/LVAD infection, WBC 12, tmax 99 usual 96), SOB and weakness. Her INR 2.3 this morning(stable). She was started on rifampin which may drop her INR.    Empyema/leukocytosis of unknown origin - temp elevated over baseline at 99, wbc fairly stable at 12. Scr up last night to 2.3 with cmet pending. Will adjust abx accordingly.  11/7-urine-pending 11/5 bld x2 -ngtd    Home dose Coumadin 5mg  MWF, 2.5mg  TTSS  Goal of Therapy:  INR 2-3 Monitor platelets by anticoagulation protocol: Yes   Plan:  Will give warfarin 5mg  tonight and watch INR closely while on rifampin and IV abx Daily INR Decrease vancomycin to 750mg  IV q24 hours - next dose tonight Continue fortaz 1g q12 hours for now Follow C&S, renal function, and fever curve  Sheppard Coil PharmD., BCPS Clinical Pharmacist Pager [phone] 03/09/2013 9:45 AM

## 2013-03-09 NOTE — Progress Notes (Signed)
  Echocardiogram 2D Echocardiogram has been performed.  Arvil Chaco 03/09/2013, 10:56 AM

## 2013-03-09 NOTE — Progress Notes (Signed)
INITIAL NUTRITION ASSESSMENT  DOCUMENTATION CODES Per approved criteria  -Not Applicable   INTERVENTION:  Resource Breeze daily (250 kcals, 9 gm protein per 8 fl oz carton) RD to follow for nutrition care plan  NUTRITION DIAGNOSIS: Predicted suboptimal intake related to nausea, poor appetite as evidenced by patient report  Goal: Pt to meet >/= 90% of their estimated nutrition needs   Monitor:  PO & supplemental intake, weight, labs, I/O's  Reason for Assessment: Malnutrition Screening Tool Report  67 y.o. female  Admitting Dx: nausea, shortness of breath  ASSESSMENT: Patient with PMH of severe HTN and CHF; s/p HM II LVAD implantation 12/18/12; admitted 10/12--10/14 for fatigue and concern for low output due to RHF; admitted for possible empyema / LVAD infection.  Patient s/p procedure 11/6: IR US GUIDE VASC ACCESS RIGHT   Patient reports a poor appetite & nausea at time of RD visit; she endorses weight loss, however, unable to quantify amount; per weight readings, patient has had a 5% weight loss since September 2014 (not significant for time frame); would benefit from nutrition supplements given likely suboptimal PO intake & concern for infection.  Height: Ht Readings from Last 1 Encounters:  03/07/13 5\' 3"  (1.6 m)    Weight: Wt Readings from Last 1 Encounters:  03/09/13 150 lb 5.7 oz (68.2 kg)    Ideal Body Weight: 115 lb  % Ideal Body Weight: 130%  Wt Readings from Last 10 Encounters:  03/09/13 150 lb 5.7 oz (68.2 kg)  03/07/13 150 lb (68.04 kg)  02/28/13 148 lb 12.8 oz (67.495 kg)  02/13/13 151 lb 0.2 oz (68.5 kg)  02/13/13 151 lb 0.2 oz (68.5 kg)  02/07/13 157 lb 9.6 oz (71.487 kg)  02/04/13 156 lb 8 oz (70.988 kg)  02/01/13 158 lb (71.668 kg)  01/31/13 156 lb (70.761 kg)  01/24/13 159 lb (72.122 kg)    Usual Body Weight: 159 lb -- September 2014  % Usual Body Weight: 94%  BMI:  Body mass index is 26.64 kg/(m^2).  Estimated Nutritional  Needs: Kcal: 1700-1900 Protein: 90-100 gm Fluid: 1.7-1.9 L  Skin: abdominal surgical incision  Diet Order: Cardiac  EDUCATION NEEDS: -No education needs identified at this time   Intake/Output Summary (Last 24 hours) at 03/09/13 1515 Last data filed at 03/09/13 1200  Gross per 24 hour  Intake    300 ml  Output   1500 ml  Net  -1200 ml    Labs:   Recent Labs Lab 03/07/13 1307 03/08/13 2155 03/09/13 1000  NA 134* 136 135  K 4.4 5.0 3.8  CL 98 99 98  CO2 18* 20 25  BUN 22 28* 23  CREATININE 1.81* 2.31* 1.66*  CALCIUM 10.5 10.3 9.5  GLUCOSE 155* 157* 88    Scheduled Meds: . amiodarone  100 mg Oral Daily  . amLODipine  10 mg Oral Daily  . aspirin EC  325 mg Oral Daily  . cefTAZidime (FORTAZ)  IV  1 g Intravenous Q12H  . citalopram  20 mg Oral Daily  . digoxin  0.0625 mg Oral Daily  . ferrous fumarate-b12-vitamic C-folic acid  1 capsule Oral TID PC  . hydrALAZINE  25 mg Oral TID  . pantoprazole  40 mg Oral Daily  . potassium chloride  10 mEq Oral TID  . rifampin  600 mg Oral QHS  . sildenafil  40 mg Oral TID  . simvastatin  10 mg Oral QHS  . spironolactone  25 mg Oral Daily  . vancomycin  1,000 mg Intravenous Q24H  . warfarin  5 mg Oral ONCE-1800  . Warfarin - Pharmacist Dosing Inpatient   Does not apply q1800    Continuous Infusions:   Past Medical History  Diagnosis Date  . Paroxysmal supraventricular tachycardia   . Hypokalemia   . Chronic systolic heart failure     a. 05/6107 Echo: EF 15%, mod to sev antlat and inflat HK, inf AK, mild to mod MR, mildly/mod reduced RV fxn, Mod TR, PASP .  [name] History of DVT (deep vein thrombosis)   . Dyslipidemia   . HTN (hypertension)   . Nonischemic cardiomyopathy     a. 06/2012 Echo: EF 15%;  b. 06/2012 Cath: nl except luminal irregs in LAD.  [name] Ventricular tachycardia     a. s/p ICD  . Goiter 2001  . Implantable cardiac defibrillator- Biotronik 2009    Single chamber  . [name] (left bundle branch block)      intermittent  . Palliative care patient     Past Surgical History  Procedure Laterality Date  . None    . Cardiac defibrillator placement  01/2009  . Insertion of implantable left ventricular assist device N/A 12/18/2012    Procedure: INSERTION OF IMPLANTABLE LEFT VENTRICULAR ASSIST DEVICE;  Surgeon: Kerin Perna, MD;  Location: [facility] OR;  Service: Open Heart Surgery;  Laterality: N/A;  . Intraoperative transesophageal echocardiogram N/A 12/18/2012    Procedure: INTRAOPERATIVE TRANSESOPHAGEAL ECHOCARDIOGRAM;  Surgeon: Kerin Perna, MD;  Location: [facility] OR;  Service: Open Heart Surgery;  Laterality: N/A;  . Tricuspid valve replacement N/A 12/18/2012    Procedure: TRICUSPID VALVE REPAIR;  Surgeon: Kerin Perna, MD;  Location: [facility] OR;  Service: Open Heart Surgery;  Laterality: N/A;  . Insertion of implantable left ventricular assist device N/A 12/18/2012    Procedure: INSERTION OF IMPLANTABLE RIGHT VENTRICULAR ASSIST DEVICE;  Surgeon: Kerin Perna, MD;  Location: [facility] OR;  Service: Open Heart Surgery;  Laterality: N/A;  . Removal of centrimag ventricular assist device N/A 12/25/2012    Procedure: REMOVAL OF CENTRIMAG VENTRICULAR ASSIST DEVICE;  Surgeon: Kerin Perna, MD;  Location: [facility] OR;  Service: Open Heart Surgery;  Laterality: N/A;  PUMP STANDBY  . Intraoperative transesophageal echocardiogram N/A 12/25/2012    Procedure: INTRAOPERATIVE TRANSESOPHAGEAL ECHOCARDIOGRAM;  Surgeon: Kerin Perna, MD;  Location[facility] OR;  Service: Open Heart Surgery;  Laterality: N/A;  . Esophagogastroduodenoscopy N/A 12/26/2012    Procedure: ESOPHAGOGASTRODUODENOSCOPY (EGD);  Surgeon: Beverley Fiedler, MD;  Location: [facility] ENDOSCOPY;  Service: Gastroenterology;  Laterality: N/A;  Bedside  . Sternal closure N/A 12/27/2012    Procedure: STERNAL CLOSURE;  Surgeon: Kerin Perna, MD;  Location: [facility] OR;  Service: Thoracic;  Laterality: N/A;  . Mediastinal exploration N/A 12/27/2012    Procedure: MEDIASTINAL EXPLORATION;   Surgeon: Kerin Perna, MD;  Location: [facility] OR;  Service: Thoracic;  Laterality: N/A;    Maureen Chatters, RD, LDN Pager #: [phone] After-Hours Pager #: [phone]

## 2013-03-09 NOTE — Progress Notes (Signed)
Subjective:  She feels ok today.  Objective: Vital signs in last 24 hours: Temp:  [98 F (36.7 C)-99.3 F (37.4 C)] 98.5 F (36.9 C) (11/07 0826) Pulse Rate:  [45-86] 71 (11/07 1700) Cardiac Rhythm:  [-] Normal sinus rhythm (11/07 1600) Resp:  [17-27] 19 (11/07 1700) SpO2:  [87 %-97 %] 92 % (11/07 1700) Weight:  [68.2 kg (150 lb 5.7 oz)] 68.2 kg (150 lb 5.7 oz) (11/07 0500)  Hemodynamic parameters for last 24 hours:    Intake/Output from previous day: 11/06 0701 - 11/07 0700 In: 250 [IV Piggyback:250] Out: 1150 [Urine:1150] Intake/Output this shift: Total I/O In: 50 [IV Piggyback:50] Out: 775 [Urine:775]  General appearance: alert and cooperative Heart: LVAD hum present Lungs: clear to auscultation bilaterally Abdomen: soft, non-tender; bowel sounds normal; no masses,  no organomegaly Extremities: extremities normal, atraumatic, no cyanosis or edema Wound: All of her incisions are well-healed. There is no tenderness over the drive line.  Lab Results:  Recent Labs  03/08/13 2155 03/09/13 1000  WBC 12.4* 9.7  HGB 10.5* 9.5*  HCT 32.7* 29.4*  PLT 303 257   BMET:  Recent Labs  03/08/13 2155 03/09/13 1000  NA 136 135  K 5.0 3.8  CL 99 98  CO2 20 25  GLUCOSE 157* 88  BUN 28* 23  CREATININE 2.31* 1.66*  CALCIUM 10.3 9.5    PT/INR:  Recent Labs  03/09/13 0425  LABPROT 24.7*  INR 2.32*   ABG    Component Value Date/Time   PHART 7.406 02/12/2013 0842   HCO3 24.1* 02/12/2013 0846   TCO2 25 02/12/2013 0846   ACIDBASEDEF 2.0 02/12/2013 0842   O2SAT 76.0 02/12/2013 0846   CBG (last 3)  No results found for this basename: GLUCAP,  in the last 72 hours  Assessment/Plan:  She says she felt ok until last Sunday and then started feeling poorly but can't be specific about what was going on. She says she felt generally weak, bloated after eating, maybe some nausea. It got worse over the next few days, she saw DB in clinic and had a PICC line inserted  for antibiotics. She has had mild elevation of her WBC ct to 14 on admission, 12.4 last night and 7 this am. She had temp of 99.3 on admission. I reviewed her CT scan and there is a small amount of loculated left pleural effusion  Around the descending aorta and in the fissure. It is unchanged from her prior scan on 10/20 and the aeration of the left lung has improved since then. I think this effusion is insignificant. If it was infected or an active inflammatory effusion it would be increasing. I would not recommend doing a thoracentesis due to the risk of bleeding or pneumothorax. There is a tiny right pleural effusion that is improved from 10/20. There is no fluid around the pump or the outflow graft in the mediastinum. There is some lucency around the drive line coursing the abdominal wall but this looks like an artifact to me. There is no fluid and it does not look like gas. If there was an infection around the drive line there would be fluid, tissue stranding and edema, redness and tenderness, which there isn't. Urine culture is pending. Her gallbladder looks ok on CT and LFT's are normal. I don't really see a need for broad spectrum antibiotics unless we are treating a definite source. I doubt that TEE is going to show anything from an ID standpoint unless she had positive  BC. She has a weak RV with moderate TR but clinically doesn't look like she has bad right heart failure on exam and had good CO on her last RHC. I wonder if any of her meds are making her feel bad, like amiodarone.   LOS: 1 day    Arnella Pralle K 03/09/2013

## 2013-03-09 NOTE — Consult Note (Signed)
[facility]    Date of Admission:  03/08/2013  Date of Consult:  03/09/2013  Reason for Consult: ? INfection in pt with LVAD Referring Physician: Dr. Gala Romney    HPI: Kaitlin [name] is an 67 y.o. 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 EF 15-25% with mild to moderate RV dysfunction   On  8/18 Underwent HM II LVAD implant (under destination criteria) along with TV repair. Post-op couse c/b RHF requiring take back to the OR for bleeding and temproray RVAD support.   S Admitted 10/12-10/14/14 for fatigue. Concern was for low output due to RHF. Echo with severe RV dysfunction and severe TR. However, RHC with relatively well preserved output. She was given keflex for UTI though only 35k mixed morphotypes grew on culture. BLood cutlures sterile   Has been struggling with persistent weakness and has had persistenly elevated WBC   Seen yesterday in [facility]. Complained of recent ICD shock and ICD interrogation showed one episode of VT treated with shock.   CT C/A/P showed persistent air around around driveline as well as small loculated R pleural effusion ? possible empyema.   PICC placed and pt started on regimen of  vanc/ceftaz and rifampin. When she received her first dose of antibiotics she felt even worse than she had all day having complained of nausea and temperature that had gone up to 98. She also c/o epigastric discomfort.  She was admitted to cardiology and a repeat CT of the chest abdomen and pelvis was performed. Urine analysis and culture and blood cultures have been taken as well. She was continued on vancomycin stat ceftazidime and rifampin.  CT of the chest 7 pelvis shows debility a viral for a prior radiographic findings include lucency around the LVAD the loculated pleural effusion. Dr. Temple Pacini with cardiothoracic surgery reviewed the casefelt that the patient did not have evidence for an  empyema.  Pt has felt better since admission to hospital. She has had Tm of 99.3   Past Medical History  Diagnosis Date  . Paroxysmal supraventricular tachycardia   . Hypokalemia   . Chronic systolic heart failure     a. 05/6107 Echo: EF 15%, mod to sev antlat and inflat HK, inf AK, mild to mod MR, mildly/mod reduced RV fxn, Mod TR, PASP .  [name] History of DVT (deep vein thrombosis)   . Dyslipidemia   . HTN (hypertension)   . Nonischemic cardiomyopathy     a. 06/2012 Echo: EF 15%;  b. 06/2012 Cath: nl except luminal irregs in LAD.  [name] Ventricular tachycardia     a. s/p ICD  . Goiter 2001  . Implantable cardiac defibrillator- Biotronik 2009    Single chamber  . [name] (left bundle branch block)     intermittent  . Palliative care patient     Past Surgical History  Procedure Laterality Date  . None    . Cardiac defibrillator placement  01/2009  . Insertion of implantable left ventricular assist device N/A 12/18/2012    Procedure: INSERTION OF IMPLANTABLE LEFT VENTRICULAR ASSIST DEVICE;  Surgeon: Kerin Perna, MD;  Location: [facility] OR;  Service: Open Heart Surgery;  Laterality: N/A;  . Intraoperative transesophageal echocardiogram N/A 12/18/2012    Procedure: INTRAOPERATIVE TRANSESOPHAGEAL ECHOCARDIOGRAM;  Surgeon: Kerin Perna, MD;  Location: [facility] OR;  Service: Open Heart Surgery;  Laterality: N/A;  . Tricuspid valve replacement N/A 12/18/2012    Procedure: TRICUSPID VALVE  REPAIR;  Surgeon: Kerin Perna, MD;  Location: [facility] OR;  Service: Open Heart Surgery;  Laterality: N/A;  . Insertion of implantable left ventricular assist device N/A 12/18/2012    Procedure: INSERTION OF IMPLANTABLE RIGHT VENTRICULAR ASSIST DEVICE;  Surgeon: Kerin Perna, MD;  Location: [facility] OR;  Service: Open Heart Surgery;  Laterality: N/A;  . Removal of centrimag ventricular assist device N/A 12/25/2012    Procedure: REMOVAL OF CENTRIMAG VENTRICULAR ASSIST DEVICE;  Surgeon: Kerin Perna, MD;  Location: [facility] OR;   Service: Open Heart Surgery;  Laterality: N/A;  PUMP STANDBY  . Intraoperative transesophageal echocardiogram N/A 12/25/2012    Procedure: INTRAOPERATIVE TRANSESOPHAGEAL ECHOCARDIOGRAM;  Surgeon: Kerin Perna, MD;  Location: [facility] OR;  Service: Open Heart Surgery;  Laterality: N/A;  . Esophagogastroduodenoscopy N/A 12/26/2012    Procedure: ESOPHAGOGASTRODUODENOSCOPY (EGD);  Surgeon: Beverley Fiedler, MD;  Location: [facility] ENDOSCOPY;  Service: Gastroenterology;  Laterality: N/A;  Bedside  . Sternal closure N/A 12/27/2012    Procedure: STERNAL CLOSURE;  Surgeon: Kerin Perna, MD;  Location: [facility] OR;  Service: Thoracic;  Laterality: N/A;  . Mediastinal exploration N/A 12/27/2012    Procedure: MEDIASTINAL EXPLORATION;  Surgeon: Kerin Perna, MD;  Location: [facility] OR;  Service: Thoracic;  Laterality: N/A;  ergies:   No Known Allergies   Medications: I have reviewed patients current medications as documented in Epic Anti-infectives   Start     Dose/Rate Route Frequency Ordered Stop   03/09/13 2200  vancomycin (VANCOCIN) IVPB 750 mg/150 ml premix  Status:  Discontinued     750 mg 150 mL/hr over 60 Minutes Intravenous Every 24 hours 03/09/13 0950 03/09/13 1143   03/09/13 2200  vancomycin (VANCOCIN) IVPB 1000 mg/200 mL premix     1,000 mg 200 mL/hr over 60 Minutes Intravenous Every 24 hours 03/09/13 1143     03/08/13 2300  vancomycin (VANCOCIN) IVPB 1000 mg/200 mL premix  Status:  Discontinued     1,000 mg 200 mL/hr over 60 Minutes Intravenous Every 24 hours 03/08/13 2216 03/09/13 0950   03/08/13 2300  cefTAZidime (FORTAZ) 1 g in dextrose 5 % 50 mL IVPB     1 g 100 mL/hr over 30 Minutes Intravenous Every 12 hours 03/08/13 2216     03/08/13 2300  rifampin (RIFADIN) capsule 600 mg     600 mg Oral Daily at bedtime 03/08/13 2217        Social History:  reports that she has never smoked. She does not have any smokeless tobacco history on file. She reports that she does not drink alcohol or use illicit  drugs.  Family History  Problem Relation Age of Onset  . Emphysema Mother   . Heart disease Mother   . Heart attack Father   . Emphysema Brother     As in HPI and primary teams notes otherwise 12 point review of systems is negative  Pulse 72, temperature 98.5 F (36.9 C), temperature source Oral, resp. rate 27, weight 150 lb 5.7 oz (68.2 kg), SpO2 90.00%. General: Alert and awake, oriented x3,  Anxious  HEENT: anicteric sclera, pupils reactive to light and accommodation, EOMI, oropharynx clear and without exudate CVS LVAD hum heard Chest: clear to auscultation bilaterally, no wheezing, rales or rhonchi Skin: her PM pocket is warm or fluctuant or tender. Her sternotomy site is clean her abdomen has some bruising but no obvious fluctuance or purulence. Abdomen: soft nontender, nondistended, normal bowel sounds, Extremities: no ulcerations or  lesions are erythema Neuro:  nonfocal, strength and sensation intact   Results for orders placed during the hospital encounter of 03/08/13 (from the past 48 hour(s))  CBC WITH DIFFERENTIAL     Status: Abnormal   Collection Time    03/08/13  9:55 PM      Result Value Range   WBC 12.4 (*) 4.0 - 10.5 K/uL   RBC 3.74 (*) 3.87 - 5.11 MIL/uL   Hemoglobin 10.5 (*) 12.0 - 15.0 g/dL   HCT 16.1 (*) 09.6 - 04.5 %   MCV 87.4  78.0 - 100.0 fL   MCH 28.1  26.0 - 34.0 pg   MCHC 32.1  30.0 - 36.0 g/dL   RDW 40.9 (*) 81.1 - 91.4 %   Platelets 303  150 - 400 K/uL   Neutrophils Relative % 82 (*) 43 - 77 %   Neutro Abs 10.1 (*) 1.7 - 7.7 K/uL   Lymphocytes Relative 11 (*) 12 - 46 %   Lymphs Abs 1.4  0.7 - 4.0 K/uL   Monocytes Relative 7  3 - 12 %   Monocytes Absolute 0.8  0.1 - 1.0 K/uL   Eosinophils Relative 0  0 - 5 %   Eosinophils Absolute 0.1  0.0 - 0.7 K/uL   Basophils Relative 0  0 - 1 %   Basophils Absolute 0.0  0.0 - 0.1 K/uL  COMPREHENSIVE METABOLIC PANEL     Status: Abnormal   Collection Time    03/08/13  9:55 PM      Result Value Range    Sodium 136  135 - 145 mEq/L   Potassium 5.0  3.5 - 5.1 mEq/L   Chloride 99  96 - 112 mEq/L   CO2 20  19 - 32 mEq/L   Glucose, Bld 157 (*) 70 - 99 mg/dL   BUN 28 (*) 6 - 23 mg/dL   Creatinine, Ser 7.82 (*) 0.50 - 1.10 mg/dL   Calcium 95.6  8.4 - 21.3 mg/dL   Total Protein 7.8  6.0 - 8.3 g/dL   Albumin 4.6  3.5 - 5.2 g/dL   AST 22  0 - 37 U/L   ALT 22  0 - 35 U/L   Alkaline Phosphatase 104  39 - 117 U/L   Total Bilirubin 0.8  0.3 - 1.2 mg/dL   GFR calc non Af Amer 21 (*) >90 mL/min   GFR calc Af Amer 24 (*) >90 mL/min   Comment: (NOTE)     The eGFR has been calculated using the CKD EPI equation.     This calculation has not been validated in all clinical situations.     eGFR's persistently <90 mL/min signify possible Chronic Kidney     Disease.  PROTIME-INR     Status: Abnormal   Collection Time    03/08/13  9:55 PM      Result Value Range   Prothrombin Time 24.1 (*) 11.6 - 15.2 seconds   INR 2.24 (*) 0.00 - 1.49  LACTATE DEHYDROGENASE     Status: Abnormal   Collection Time    03/08/13  9:55 PM      Result Value Range   LDH 299 (*) 94 - 250 U/L  PRO B NATRIURETIC PEPTIDE     Status: Abnormal   Collection Time    03/08/13  9:55 PM      Result Value Range   Pro B Natriuretic peptide (BNP) 2459.0 (*) 0 - 125 pg/mL  URINALYSIS, ROUTINE W REFLEX MICROSCOPIC     Status:  Abnormal   Collection Time    03/09/13  1:24 AM      Result Value Range   Color, Urine YELLOW  YELLOW   APPearance CLOUDY (*) CLEAR   Specific Gravity, Urine 1.011  1.005 - 1.030   pH 6.5  5.0 - 8.0   Glucose, UA NEGATIVE  NEGATIVE mg/dL   Hgb urine dipstick NEGATIVE  NEGATIVE   Bilirubin Urine NEGATIVE  NEGATIVE   Ketones, ur NEGATIVE  NEGATIVE mg/dL   Protein, ur 30 (*) NEGATIVE mg/dL   Urobilinogen, UA 0.2  0.0 - 1.0 mg/dL   Nitrite NEGATIVE  NEGATIVE   Leukocytes, UA SMALL (*) NEGATIVE  MRSA PCR SCREENING     Status: None   Collection Time    03/09/13  1:24 AM      Result Value Range   MRSA by PCR  NEGATIVE  NEGATIVE   Comment:            The GeneXpert MRSA Assay (FDA     approved for NASAL specimens     only), is one component of a     comprehensive MRSA colonization     surveillance program. It is not     intended to diagnose MRSA     infection nor to guide or     monitor treatment for     MRSA infections.  URINE MICROSCOPIC-ADD ON     Status: Abnormal   Collection Time    03/09/13  1:24 AM      Result Value Range   Squamous Epithelial / LPF FEW (*) RARE   WBC, UA 3-6  <3 WBC/hpf   RBC / HPF 3-6  <3 RBC/hpf   Bacteria, UA FEW (*) RARE   Casts HYALINE CASTS (*) NEGATIVE  PROTIME-INR     Status: Abnormal   Collection Time    03/09/13  4:25 AM      Result Value Range   Prothrombin Time 24.7 (*) 11.6 - 15.2 seconds   INR 2.32 (*) 0.00 - 1.49  SEDIMENTATION RATE     Status: None   Collection Time    03/09/13  4:25 AM      Result Value Range   Sed Rate 16  0 - 22 mm/hr  COMPREHENSIVE METABOLIC PANEL     Status: Abnormal   Collection Time    03/09/13 10:00 AM      Result Value Range   Sodium 135  135 - 145 mEq/L   Potassium 3.8  3.5 - 5.1 mEq/L   Chloride 98  96 - 112 mEq/L   CO2 25  19 - 32 mEq/L   Glucose, Bld 88  70 - 99 mg/dL   BUN 23  6 - 23 mg/dL   Creatinine, Ser 1.61 (*) 0.50 - 1.10 mg/dL   Calcium 9.5  8.4 - 09.6 mg/dL   Total Protein 7.0  6.0 - 8.3 g/dL   Albumin 4.0  3.5 - 5.2 g/dL   AST 20  0 - 37 U/L   ALT 19  0 - 35 U/L   Alkaline Phosphatase 86  39 - 117 U/L   Total Bilirubin 1.5 (*) 0.3 - 1.2 mg/dL   GFR calc non Af Amer 31 (*) >90 mL/min   GFR calc Af Amer 36 (*) >90 mL/min   Comment: (NOTE)     The eGFR has been calculated using the CKD EPI equation.     This calculation has not been validated in all clinical situations.  eGFR's persistently <90 mL/min signify possible Chronic Kidney     Disease.  CBC     Status: Abnormal   Collection Time    03/09/13 10:00 AM      Result Value Range   WBC 9.7  4.0 - 10.5 K/uL   RBC 3.37 (*) 3.87 -  5.11 MIL/uL   Hemoglobin 9.5 (*) 12.0 - 15.0 g/dL   HCT 40.9 (*) 81.1 - 91.4 %   MCV 87.2  78.0 - 100.0 fL   MCH 28.2  26.0 - 34.0 pg   MCHC 32.3  30.0 - 36.0 g/dL   RDW 78.2 (*) 95.6 - 21.3 %   Platelets 257  150 - 400 K/uL      Component Value Date/Time   SDES BLOOD LEFT HAND 03/07/2013 1508   SPECREQUEST BOTTLES DRAWN AEROBIC AND ANAEROBIC 10CC 03/07/2013 1508   CULT  Value:        BLOOD CULTURE RECEIVED NO GROWTH TO DATE CULTURE WILL BE HELD FOR 5 DAYS BEFORE ISSUING A FINAL NEGATIVE REPORT Performed at [facility] 03/07/2013 1508   REPTSTATUS PENDING 03/07/2013 1508   Ct Abdomen Wo Contrast  03/08/2013   CLINICAL DATA:  LVAD pocket infection. Recent course of antibiotics without improvement.  EXAM: CT CHEST AND ABDOMEN WITHOUT CONTRAST  TECHNIQUE: Multidetector CT imaging of the chest and abdomen was performed following the standard protocol without intravenous contrast.  COMPARISON:  02/19/2013 and CT abdomen 12/14/2012.  FINDINGS: CT CHEST FINDINGS  Thyroid is rather uniformly enlarged. No pathologically enlarged mediastinal or axillary lymph nodes. Hilar regions are difficult to definitively evaluate without IV contrast. Left subclavian AICD lead terminates in the right ventricle and courses through a tricuspid annuloplasty. Atherosclerotic calcification of the arterial vasculature. Heart is enlarged. Left ventricular assist device is in place and is unchanged in appearance from 02/19/2013. Lucency adjacent to the drive line in the subcutaneous tissues of the upper right abdomen is unchanged. No developing fluid collection or phlegmonous debris. No pericardial effusion.  Small right pleural effusion, decreased in size and simple in appearance. Small loculated left pleural effusion, unchanged. Scattered volume loss in the lungs bilaterally with slight improvement in right lung aeration in the interval. Airway is unremarkable.  CT ABDOMEN FINDINGS  Liver, gallbladder, adrenal glands and  right kidney are unremarkable. There may be a sub cm low-attenuation lesion in the lower pole left kidney (image 61), too small to characterize. Possible punctate stone in the interpolar left kidney (image 57). The spleen and pancreas are unremarkable. A single linear metallic clip like structure is seen in the region of the gastroesophageal junction. Stomach and visualized bowel are otherwise unremarkable.  Minimal scattered atherosclerotic calcification of the arterial vasculature without abdominal aortic aneurysm. Retroperitoneal lymph nodes measure up to 10 mm in the left periaortic station, as before. No worrisome lytic or sclerotic lesions. Degenerative changes there is seen in the spine.  IMPRESSION: 1. LVAD is unchanged in appearance from 02/19/2013 and without associated abscess or phlegmon. Lucency adjacent to the drive line is unchanged as well. 2. Small loculated left pleural effusion, unchanged. Empyema can have this appearance. 3. Small right pleural effusion, decreased in size and simple in appearance. 4. Scattered volume loss in the lungs bilaterally with overall improvement in aeration in the right lung in the interval. 5. No acute findings in the abdomen. 6. Possible punctate left renal stone.   Electronically Signed   By: Leanna Battles M.D.   On: 03/08/2013 13:07   Dg  Chest 2 View  03/08/2013   CLINICAL DATA:  67-year- female with shortness of breath and weakness. LVAD. Initial encounter.  EXAM: CHEST  2 VIEW  COMPARISON:  Chest CT from the same day and earlier.  FINDINGS: Small bilateral pleural effusions, better demonstrated on the earlier study today. LVAD remains in place. Left chest cardiac AICD. Stable cardiac size and mediastinal contours. Perihilar atelectasis. No pneumothorax or edema. Right PICC line in place. Visualized tracheal air column is within normal limits. No acute osseous abnormality identified.  IMPRESSION: Small bilateral pleural effusions and atelectasis. LVAD. See also  chest CT from today reported separately.   Electronically Signed   By: Augusto Gamble M.D.   On: 03/08/2013 22:42   Ct Chest Wo Contrast  03/08/2013   CLINICAL DATA:  LVAD pocket infection. Recent course of antibiotics without improvement.  EXAM: CT CHEST AND ABDOMEN WITHOUT CONTRAST  TECHNIQUE: Multidetector CT imaging of the chest and abdomen was performed following the standard protocol without intravenous contrast.  COMPARISON:  02/19/2013 and CT abdomen 12/14/2012.  FINDINGS: CT CHEST FINDINGS  Thyroid is rather uniformly enlarged. No pathologically enlarged mediastinal or axillary lymph nodes. Hilar regions are difficult to definitively evaluate without IV contrast. Left subclavian AICD lead terminates in the right ventricle and courses through a tricuspid annuloplasty. Atherosclerotic calcification of the arterial vasculature. Heart is enlarged. Left ventricular assist device is in place and is unchanged in appearance from 02/19/2013. Lucency adjacent to the drive line in the subcutaneous tissues of the upper right abdomen is unchanged. No developing fluid collection or phlegmonous debris. No pericardial effusion.  Small right pleural effusion, decreased in size and simple in appearance. Small loculated left pleural effusion, unchanged. Scattered volume loss in the lungs bilaterally with slight improvement in right lung aeration in the interval. Airway is unremarkable.  CT ABDOMEN FINDINGS  Liver, gallbladder, adrenal glands and right kidney are unremarkable. There may be a sub cm low-attenuation lesion in the lower pole left kidney (image 61), too small to characterize. Possible punctate stone in the interpolar left kidney (image 57). The spleen and pancreas are unremarkable. A single linear metallic clip like structure is seen in the region of the gastroesophageal junction. Stomach and visualized bowel are otherwise unremarkable.  Minimal scattered atherosclerotic calcification of the arterial vasculature  without abdominal aortic aneurysm. Retroperitoneal lymph nodes measure up to 10 mm in the left periaortic station, as before. No worrisome lytic or sclerotic lesions. Degenerative changes there is seen in the spine.  IMPRESSION: 1. LVAD is unchanged in appearance from 02/19/2013 and without associated abscess or phlegmon. Lucency adjacent to the drive line is unchanged as well. 2. Small loculated left pleural effusion, unchanged. Empyema can have this appearance. 3. Small right pleural effusion, decreased in size and simple in appearance. 4. Scattered volume loss in the lungs bilaterally with overall improvement in aeration in the right lung in the interval. 5. No acute findings in the abdomen. 6. Possible punctate left renal stone.   Electronically Signed   By: Leanna Battles M.D.   On: 03/08/2013 13:07   Ir Fluoro Guide Cv Line Right  03/08/2013   CLINICAL DATA:  S/p VAD implant, need longterm antibiotics possible infection.  EXAM: Single lumen power injectable PICC LINE PLACEMENT WITH ULTRASOUND AND FLUOROSCOPIC GUIDANCE  FLUOROSCOPY TIME:  36 seconds  PROCEDURE: The patient was advised of the possible risks and complications and agreed to undergo the procedure. The patient was then brought to the angiographic suite for the procedure.  The right arm was prepped with chlorhexidine, draped in the usual sterile fashion using maximum barrier technique (cap and mask, sterile gown, sterile gloves, large sterile sheet, hand hygiene and cutaneous antisepsis) and infiltrated locally with 1% Lidocaine.  Ultrasound demonstrated patency of the right brachial vein, and this was documented with an image. Under real-time ultrasound guidance, this vein was accessed with a 21 gauge micropuncture needle and image documentation was performed. A 0.018 wire was introduced in to the vein. Over this, a 5 [place] single lumen power-injectable PICC was advanced to the lower SVC/right atrial junction. Fluoroscopy during the procedure  and fluoro spot radiograph confirms appropriate catheter position. The catheter was flushed and covered with a sterile dressing.  Complications: none  IMPRESSION: Successful right arm Power injectable PICC line placement with ultrasound and fluoroscopic guidance. The catheter is ready for use.  Read By:  Pattricia Boss PA-C   Electronically Signed   By: Irish Lack M.D.   On: 03/08/2013 14:58   Ir US Guide Vasc Access Right  03/08/2013   CLINICAL DATA:  S/p VAD implant, need longterm antibiotics possible infection.  EXAM: Single lumen power injectable PICC LINE PLACEMENT WITH ULTRASOUND AND FLUOROSCOPIC GUIDANCE  FLUOROSCOPY TIME:  36 seconds  PROCEDURE: The patient was advised of the possible risks and complications and agreed to undergo the procedure. The patient was then brought to the angiographic suite for the procedure.  The right arm was prepped with chlorhexidine, draped in the usual sterile fashion using maximum barrier technique (cap and mask, sterile gown, sterile gloves, large sterile sheet, hand hygiene and cutaneous antisepsis) and infiltrated locally with 1% Lidocaine.  Ultrasound demonstrated patency of the right brachial vein, and this was documented with an image. Under real-time ultrasound guidance, this vein was accessed with a 21 gauge micropuncture needle and image documentation was performed. A 0.018 wire was introduced in to the vein. Over this, a 5 [place] single lumen power-injectable PICC was advanced to the lower SVC/right atrial junction. Fluoroscopy during the procedure and fluoro spot radiograph confirms appropriate catheter position. The catheter was flushed and covered with a sterile dressing.  Complications: none  IMPRESSION: Successful right arm Power injectable PICC line placement with ultrasound and fluoroscopic guidance. The catheter is ready for use.  Read By:  Pattricia Boss PA-C   Electronically Signed   By: Irish Lack M.D.   On: 03/08/2013 14:58     Recent  Results (from the past 720 hour(s))  URINE CULTURE     Status: None   Collection Time    02/11/13  9:24 PM      Result Value Range Status   Specimen Description URINE, RANDOM   Final   Special Requests ADDED 2143   Final   Culture  Setup Time     Final   Value: 02/11/2013 23:33     Performed at [facility]   Colony Count     Final   Value: 35,000 COLONIES/ML     Performed at [facility]   Culture     Final   Value: Multiple bacterial morphotypes present, none predominant. Suggest appropriate recollection if clinically indicated.     Performed at [facility]   Report Status 02/13/2013 FINAL   Final  CULTURE, BLOOD (ROUTINE X 2)     Status: None   Collection Time    03/07/13  3:00 PM      Result Value Range Status   Specimen Description BLOOD LEFT ARM  Final   Special Requests BOTTLES DRAWN AEROBIC AND ANAEROBIC 10CC   Final   Culture  Setup Time     Final   Value: 03/07/2013 20:28     Performed at [facility]   Culture     Final   Value:        BLOOD CULTURE RECEIVED NO GROWTH TO DATE CULTURE WILL BE HELD FOR 5 DAYS BEFORE ISSUING A FINAL NEGATIVE REPORT     Performed at [facility]   Report Status PENDING   Incomplete  CULTURE, BLOOD (ROUTINE X 2)     Status: None   Collection Time    03/07/13  3:08 PM      Result Value Range Status   Specimen Description BLOOD LEFT HAND   Final   Special Requests BOTTLES DRAWN AEROBIC AND ANAEROBIC 10CC   Final   Culture  Setup Time     Final   Value: 03/07/2013 20:29     Performed at [facility]   Culture     Final   Value:        BLOOD CULTURE RECEIVED NO GROWTH TO DATE CULTURE WILL BE HELD FOR 5 DAYS BEFORE ISSUING A FINAL NEGATIVE REPORT     Performed at [facility]   Report Status PENDING   Incomplete  MRSA PCR SCREENING     Status: None   Collection Time    03/09/13  1:24 AM      Result Value Range Status   MRSA by PCR NEGATIVE  NEGATIVE Final   Comment:             The GeneXpert MRSA Assay (FDA     approved for NASAL specimens     only), is one component of a     comprehensive MRSA colonization     surveillance program. It is not     intended to diagnose MRSA     infection nor to guide or     monitor treatment for     MRSA infections.     Impression/Recommendation  67 yo with LVAD and complicated course with recent concern for infection. The only suspicious finding on initial CT scan was a lucency around LVAD, and a pleural effusion that looked to be loculated by CT findings. She was started on vancomycin and ceftazidime and rifampin although she likely only received one dose of her ceftaz and felt even worse and has been readmitted. CT shows stability of all of the a fore mentioned findings.  #1 Concern for infection in LVAD pt: I feel the patient  Has already been extensively worked up. I discussed the case with Dr. Gala Romney and I feel that pursuing a TEE to ensure there is no evidence of endovascular infection of pacer wires or heart valves, device would be reasonable. IF this TEE is without any worrisome findings I would DC all of her abx adn observe her off of them and reassure her that we have looked for all reasonable possible suspects for bacterial infection  #2 Screening: she already was tested for HIV and viral hepatis in [place] and found negative  I spent greater than 60 minutes with the patient including greater than 50% of time in face to face counsel of the patient and in coordination of their care.  Dr. Orvan Falconer  Is covering this weekend.  Thank you so much for this interesting consult  [facility] [phone] (pager) [phone] (office) 03/09/2013,  3:19 PM  Paulette Blanch Dam 03/09/2013, 3:19 PM

## 2013-03-10 ENCOUNTER — Inpatient Hospital Stay (HOSPITAL_COMMUNITY): Payer: Medicare Other

## 2013-03-10 DIAGNOSIS — D72829 Elevated white blood cell count, unspecified: Secondary | ICD-10-CM

## 2013-03-10 DIAGNOSIS — R109 Unspecified abdominal pain: Secondary | ICD-10-CM

## 2013-03-10 LAB — URINE CULTURE

## 2013-03-10 LAB — PROTIME-INR: Prothrombin Time: 25.1 seconds — ABNORMAL HIGH (ref 11.6–15.2)

## 2013-03-10 MED ORDER — WARFARIN SODIUM 2.5 MG PO TABS
2.5000 mg | ORAL_TABLET | Freq: Once | ORAL | Status: AC
  Administered 2013-03-10: 2.5 mg via ORAL
  Filled 2013-03-10: qty 1

## 2013-03-10 NOTE — Progress Notes (Signed)
ANTICOAGULATION CONSULT NOTE   Pharmacy Consult for Coumadin Indication: LVAD  No Known Allergies  Patient Measurements: Height: 5\' 3"  (160 cm) Weight: 153 lb 3.5 oz (69.5 kg) IBW/kg (Calculated) : 52.4  Vital Signs: Temp: 98.3 F (36.8 C) (11/08 1121) Temp src: Oral (11/08 1121) Pulse Rate: 75 (11/08 1200)  Labs:  Recent Labs  03/07/13 1307 03/08/13 2155 03/09/13 0425 03/09/13 1000 03/10/13 0438  HGB 11.3* 10.5*  --  9.5*  --   HCT 34.4* 32.7*  --  29.4*  --   PLT 310 303  --  257  --   LABPROT 20.8* 24.1* 24.7*  --  25.1*  INR 1.85* 2.24* 2.32*  --  2.37*  CREATININE 1.81* 2.31*  --  1.66*  --     Estimated Creatinine Clearance: 31.6 ml/min (by C-G formula based on Cr of 1.66).  Assessment: 67yof s/p LVAD implant 12/2012. She presents with possible empyema/LVAD infection.  Initially started on broad spectrum abx - now off per ID recommendations.   Pt on coumadin for anticoagulation for LVAD. INR 2.37 (therapeutic) - appears stable on home dose. No bleeding noted although H/H down. Spoke with Dr. Gala Romney re: RHC on Monday and whether coumadin needs to be held. He said he is ok to do procedure as long as INR stays under 3  Home dose Coumadin 5mg  MWF, 2.5mg  TTSS  Goal of Therapy:  INR 2-3 Monitor platelets by anticoagulation protocol: Yes   Plan:  Will give warfarin 2.5mg  tonight  Daily INR  Christoper Fabian, PharmD, BCPS Clinical pharmacist, pager [phone] 03/10/2013 12:51 PM

## 2013-03-10 NOTE — Progress Notes (Addendum)
Advanced Heart Failure Team History and Physical Note    HPI:    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 Post-op couse c/b RHF requiring take back to the OR for bleeding and temproray RVAD support.   Admitted 10/12-10/14/14 for fatigue. Concern was for low output due to RHF. Echo with severe RV dysfunction and severe TR. However, RHC with relatively well preserved output.  RA = 17  RV = 35/2/12  PA = 35/15 (22)  PCW = 11  Fick cardiac output/index = 5.4/3.1  Thermo CO/CI = 3.7/2.1  PVR = 2.0 WU (Fick)  FA sat = assumed 98%  PA sat = 66%, 70%, 70%  RA sat = 76%    CT C/A/P showed persistent air around around driveline as well as small loculated R pleural effusion ? possible empyema. PICC placed and started on abx vanc/ceftaz and rifampin.   Admitted 11/6 with nausea.   Now feels better but still weak. Intermittent CHB. ESR 15. Denies dyspnea or orthopnea. No fevers or chills, Seen by ID who recommended stopping abx for now.   Echo reviewed personally. Unable to see valves well. RV markedly dilated and severely hypokinetic. Septal flattening c/w RV pressure volume overload.   VAD interrogation  RPM 8600 Flow 5.5L Power 5.0 PI 5.8  3 PI events    Objective:    Vital Signs:   Temp:  [98.4 F (36.9 C)-98.5 F (36.9 C)] 98.5 F (36.9 C) (11/08 0400) Pulse Rate:  [64-87] 78 (11/08 0600) Resp:  [15-28] 19 (11/08 0600) SpO2:  [87 %-94 %] 94 % (11/08 0600) Weight:  [69.5 kg (153 lb 3.5 oz)] 69.5 kg (153 lb 3.5 oz) (11/08 0500)   Filed Weights   03/09/13 0500 03/10/13 0500  Weight: 68.2 kg (150 lb 5.7 oz) 69.5 kg (153 lb 3.5 oz)    Physical Exam: GENERAL: Fatigued appearing,no acute distress; HEENT: normal  NECK: Supple, JVP 9-10 . 2+ bilaterally, no bruits. No lymphadenopathy  or thyromegaly appreciated.  CARDIAC: Mechanical heart sounds with LVAD hum present.  LUNGS: Clear to auscultation bilaterally.  ABDOMEN: Soft, round, nontender, positive bowel sounds x4.  LVAD exit site: well-healed and incorporated. Dressing dry and intact. No erythema or drainage. Stabilization device present and accurately applied. No tenderness to palpation at all along driveline tract.  EXTREMITIES:  no cyanosis, clubbing, rash. tr edema  NEUROLOGIC: Alert and oriented x 4. Gait steady. No aphasia. No dysarthria. Affect pleasant.     Labs: Basic Metabolic Panel:  Recent Labs Lab 03/07/13 1307 03/08/13 2155 03/09/13 1000  NA 134* 136 135  K 4.4 5.0 3.8  CL 98 99 98  CO2 18* 20 25  GLUCOSE 155* 157* 88  BUN 22 28* 23  CREATININE 1.81* 2.31* 1.66*  CALCIUM 10.5 10.3 9.5    Liver Function Tests:  Recent Labs Lab 03/08/13 2155 03/09/13 1000  AST 22 20  ALT 22 19  ALKPHOS 104 86  BILITOT 0.8 1.5*  PROT 7.8 7.0  ALBUMIN 4.6 4.0   No results found for this basename: LIPASE, AMYLASE,  in the last 168 hours No results found for this basename: AMMONIA,  in the last 168 hours  CBC:  Recent Labs Lab 03/07/13 1307 03/08/13 2155 03/09/13 1000  WBC 14.0* 12.4* 9.7  NEUTROABS 11.6* 10.1*  --   HGB 11.3* 10.5* 9.5*  HCT 34.4* 32.7* 29.4*  MCV 87.3 87.4 87.2  PLT 310 303 257    Cardiac Enzymes: No results found for this basename: CKTOTAL, CKMB, CKMBINDEX, TROPONINI,  in the last 168 hours  BNP: BNP (last 3 results)  Recent Labs  02/28/13 1423 03/07/13 1307 03/08/13 2155  PROBNP 2290.0* 1875.0* 2459.0*    CBG: No results found for this basename: GLUCAP,  in the last 168 hours  Coagulation Studies:  Recent Labs  03/07/13 1307 03/08/13 2155 03/09/13 0425 03/10/13 0438  LABPROT 20.8* 24.1* 24.7* 25.1*  INR 1.85* 2.24* 2.32* 2.37*    Other results: EKG: SR with Mobitz Type II HB.  Imaging: Ct Abdomen Wo Contrast  03/08/2013   CLINICAL DATA:   LVAD pocket infection. Recent course of antibiotics without improvement.  EXAM: CT CHEST AND ABDOMEN WITHOUT CONTRAST  TECHNIQUE: Multidetector CT imaging of the chest and abdomen was performed following the standard protocol without intravenous contrast.  COMPARISON:  02/19/2013 and CT abdomen 12/14/2012.  FINDINGS: CT CHEST FINDINGS  Thyroid is rather uniformly enlarged. No pathologically enlarged mediastinal or axillary lymph nodes. Hilar regions are difficult to definitively evaluate without IV contrast. Left subclavian AICD lead terminates in the right ventricle and courses through a tricuspid annuloplasty. Atherosclerotic calcification of the arterial vasculature. Heart is enlarged. Left ventricular assist device is in place and is unchanged in appearance from 02/19/2013. Lucency adjacent to the drive line in the subcutaneous tissues of the upper right abdomen is unchanged. No developing fluid collection or phlegmonous debris. No pericardial effusion.  Small right pleural effusion, decreased in size and simple in appearance. Small loculated left pleural effusion, unchanged. Scattered volume loss in the lungs bilaterally with slight improvement in right lung aeration in the interval. Airway is unremarkable.  CT ABDOMEN FINDINGS  Liver, gallbladder, adrenal glands and right kidney are unremarkable. There may be a sub cm low-attenuation lesion in the lower pole left kidney (image 61), too small to characterize. Possible punctate stone in the interpolar left kidney (image 57). The spleen and pancreas are unremarkable. A single linear metallic clip like structure is seen in the region of the gastroesophageal junction. Stomach and visualized bowel are otherwise unremarkable.  Minimal scattered atherosclerotic calcification of the arterial vasculature without abdominal aortic aneurysm. Retroperitoneal lymph nodes measure up to 10 mm in the left periaortic station, as before. No worrisome lytic or sclerotic lesions.  Degenerative changes there is seen in the spine.  IMPRESSION: 1. LVAD is unchanged in appearance from 02/19/2013 and without associated abscess or phlegmon. Lucency adjacent to the drive line is unchanged as well. 2. Small loculated left pleural effusion, unchanged. Empyema can have this appearance. 3. Small right pleural effusion, decreased in size and simple in appearance. 4. Scattered volume loss in the lungs bilaterally with overall improvement in aeration in the right lung in the interval. 5. No acute findings in the abdomen. 6. Possible punctate left renal stone.   Electronically Signed   By: Leanna Battles M.D.   On: 03/08/2013 13:07   Dg Chest 2 View  03/08/2013   CLINICAL DATA:  67-year- female with shortness of breath and weakness. LVAD. Initial encounter.  EXAM: CHEST  2 VIEW  COMPARISON:  Chest CT from the same day and earlier.  FINDINGS: Small bilateral pleural effusions, better demonstrated on the earlier study today. LVAD remains in place. Left chest cardiac AICD. Stable cardiac size and mediastinal contours. Perihilar atelectasis. No  pneumothorax or edema. Right PICC line in place. Visualized tracheal air column is within normal limits. No acute osseous abnormality identified.  IMPRESSION: Small bilateral pleural effusions and atelectasis. LVAD. See also chest CT from today reported separately.   Electronically Signed   By: Augusto Gamble M.D.   On: 03/08/2013 22:42   Ct Chest Wo Contrast  03/08/2013   CLINICAL DATA:  LVAD pocket infection. Recent course of antibiotics without improvement.  EXAM: CT CHEST AND ABDOMEN WITHOUT CONTRAST  TECHNIQUE: Multidetector CT imaging of the chest and abdomen was performed following the standard protocol without intravenous contrast.  COMPARISON:  02/19/2013 and CT abdomen 12/14/2012.  FINDINGS: CT CHEST FINDINGS  Thyroid is rather uniformly enlarged. No pathologically enlarged mediastinal or axillary lymph nodes. Hilar regions are difficult to definitively  evaluate without IV contrast. Left subclavian AICD lead terminates in the right ventricle and courses through a tricuspid annuloplasty. Atherosclerotic calcification of the arterial vasculature. Heart is enlarged. Left ventricular assist device is in place and is unchanged in appearance from 02/19/2013. Lucency adjacent to the drive line in the subcutaneous tissues of the upper right abdomen is unchanged. No developing fluid collection or phlegmonous debris. No pericardial effusion.  Small right pleural effusion, decreased in size and simple in appearance. Small loculated left pleural effusion, unchanged. Scattered volume loss in the lungs bilaterally with slight improvement in right lung aeration in the interval. Airway is unremarkable.  CT ABDOMEN FINDINGS  Liver, gallbladder, adrenal glands and right kidney are unremarkable. There may be a sub cm low-attenuation lesion in the lower pole left kidney (image 61), too small to characterize. Possible punctate stone in the interpolar left kidney (image 57). The spleen and pancreas are unremarkable. A single linear metallic clip like structure is seen in the region of the gastroesophageal junction. Stomach and visualized bowel are otherwise unremarkable.  Minimal scattered atherosclerotic calcification of the arterial vasculature without abdominal aortic aneurysm. Retroperitoneal lymph nodes measure up to 10 mm in the left periaortic station, as before. No worrisome lytic or sclerotic lesions. Degenerative changes there is seen in the spine.  IMPRESSION: 1. LVAD is unchanged in appearance from 02/19/2013 and without associated abscess or phlegmon. Lucency adjacent to the drive line is unchanged as well. 2. Small loculated left pleural effusion, unchanged. Empyema can have this appearance. 3. Small right pleural effusion, decreased in size and simple in appearance. 4. Scattered volume loss in the lungs bilaterally with overall improvement in aeration in the right lung in  the interval. 5. No acute findings in the abdomen. 6. Possible punctate left renal stone.   Electronically Signed   By: Leanna Battles M.D.   On: 03/08/2013 13:07   Ir Fluoro Guide Cv Line Right  03/08/2013   CLINICAL DATA:  S/p VAD implant, need longterm antibiotics possible infection.  EXAM: Single lumen power injectable PICC LINE PLACEMENT WITH ULTRASOUND AND FLUOROSCOPIC GUIDANCE  FLUOROSCOPY TIME:  36 seconds  PROCEDURE: The patient was advised of the possible risks and complications and agreed to undergo the procedure. The patient was then brought to the angiographic suite for the procedure.  The right arm was prepped with chlorhexidine, draped in the usual sterile fashion using maximum barrier technique (cap and mask, sterile gown, sterile gloves, large sterile sheet, hand hygiene and cutaneous antisepsis) and infiltrated locally with 1% Lidocaine.  Ultrasound demonstrated patency of the right brachial vein, and this was documented with an image. Under real-time ultrasound guidance, this vein was accessed with a 21 gauge micropuncture  needle and image documentation was performed. A 0.018 wire was introduced in to the vein. Over this, a 5 [place] single lumen power-injectable PICC was advanced to the lower SVC/right atrial junction. Fluoroscopy during the procedure and fluoro spot radiograph confirms appropriate catheter position. The catheter was flushed and covered with a sterile dressing.  Complications: none  IMPRESSION: Successful right arm Power injectable PICC line placement with ultrasound and fluoroscopic guidance. The catheter is ready for use.  Read By:  Pattricia Boss PA-C   Electronically Signed   By: Irish Lack M.D.   On: 03/08/2013 14:58   Ir US Guide Vasc Access Right  03/08/2013   CLINICAL DATA:  S/p VAD implant, need longterm antibiotics possible infection.  EXAM: Single lumen power injectable PICC LINE PLACEMENT WITH ULTRASOUND AND FLUOROSCOPIC GUIDANCE  FLUOROSCOPY TIME:  36  seconds  PROCEDURE: The patient was advised of the possible risks and complications and agreed to undergo the procedure. The patient was then brought to the angiographic suite for the procedure.  The right arm was prepped with chlorhexidine, draped in the usual sterile fashion using maximum barrier technique (cap and mask, sterile gown, sterile gloves, large sterile sheet, hand hygiene and cutaneous antisepsis) and infiltrated locally with 1% Lidocaine.  Ultrasound demonstrated patency of the right brachial vein, and this was documented with an image. Under real-time ultrasound guidance, this vein was accessed with a 21 gauge micropuncture needle and image documentation was performed. A 0.018 wire was introduced in to the vein. Over this, a 5 [place] single lumen power-injectable PICC was advanced to the lower SVC/right atrial junction. Fluoroscopy during the procedure and fluoro spot radiograph confirms appropriate catheter position. The catheter was flushed and covered with a sterile dressing.  Complications: none  IMPRESSION: Successful right arm Power injectable PICC line placement with ultrasound and fluoroscopic guidance. The catheter is ready for use.  Read By:  Pattricia Boss PA-C   Electronically Signed   By: Irish Lack M.D.   On: 03/08/2013 14:58        Assessment:   1. Nausea 2. Dypnea 3. Chronic biventricular HF Status post HM-II LVAD implantation.  4. PAF  5. Recent VT s/p ICD shock on amio 6. R pleural effusion - ? Empyema 7. ? driveline infection 8. Leukocytosis 9. Transient CHB 10. Chronic renal failure, stage III  Plan/Discussion:    Feeling ok but still weak. Cultures remain negative ID recommends stopping abx and considering TEE.  After reviewing echo, I am even more convinced that her symptoms are due to progressive RHF (despite lack of PI events on VAD) and I suspect she will need milrinone support at home however will need RHC numbers to support this.  Will continue  current regimen and plan TEE (to evaluate for endocarditis or ICD infection) and RHC on Monday.   RUQ u/s today.    Daniel Bensimhon,MD 7:15 AM

## 2013-03-10 NOTE — Progress Notes (Signed)
Patient ID: Kaitlin [name], female   DOB: April 21, 1946, 67 y.o.   MRN: [id number]         [facility]    Date of Admission:  03/08/2013   Total days of antibiotics 3           . amiodarone  100 mg Oral Daily  . amLODipine  10 mg Oral Daily  . aspirin EC  325 mg Oral Daily  . citalopram  20 mg Oral Daily  . digoxin  0.0625 mg Oral Daily  . feeding supplement (RESOURCE BREEZE)  1 Container Oral Q1500  . ferrous fumarate-b12-vitamic C-folic acid  1 capsule Oral TID PC  . hydrALAZINE  25 mg Oral TID  . pantoprazole  40 mg Oral Daily  . potassium chloride  10 mEq Oral TID  . sildenafil  40 mg Oral TID  . simvastatin  10 mg Oral QHS  . spironolactone  25 mg Oral Daily  . warfarin  2.5 mg Oral ONCE-1800  . Warfarin - Pharmacist Dosing Inpatient   Does not apply q1800    Objective: Temp:  [97.9 F (36.6 C)-98.5 F (36.9 C)] 98.3 F (36.8 C) (11/08 1121) Pulse Rate:  [64-94] 74 (11/08 1400) Resp:  [15-29] 22 (11/08 1400) SpO2:  [87 %-94 %] 92 % (11/08 1400) Weight:  [69.5 kg (153 lb 3.5 oz)] 69.5 kg (153 lb 3.5 oz) (11/08 0500)  General: Sitting up talking with a friend  Lab Results Lab Results  Component Value Date   WBC 9.7 03/09/2013   HGB 9.5* 03/09/2013   HCT 29.4* 03/09/2013   MCV 87.2 03/09/2013   PLT 257 03/09/2013    Lab Results  Component Value Date   CREATININE 1.66* 03/09/2013   BUN 23 03/09/2013   NA 135 03/09/2013   K 3.8 03/09/2013   CL 98 03/09/2013   CO2 25 03/09/2013    Lab Results  Component Value Date   ALT 19 03/09/2013   AST 20 03/09/2013   ALKPHOS 86 03/09/2013   BILITOT 1.5* 03/09/2013      Microbiology: Recent Results (from the past 240 hour(s))  CULTURE, BLOOD (ROUTINE X 2)     Status: None   Collection Time    03/07/13  3:00 PM      Result Value Range Status   Specimen Description BLOOD LEFT ARM   Final   Special Requests BOTTLES DRAWN AEROBIC AND ANAEROBIC 10CC   Final   Culture  Setup Time     Final   Value:  03/07/2013 20:28     Performed at [facility]   Culture     Final   Value:        BLOOD CULTURE RECEIVED NO GROWTH TO DATE CULTURE WILL BE HELD FOR 5 DAYS BEFORE ISSUING A FINAL NEGATIVE REPORT     Performed at [facility]   Report Status PENDING   Incomplete  CULTURE, BLOOD (ROUTINE X 2)     Status: None   Collection Time    03/07/13  3:08 PM      Result Value Range Status   Specimen Description BLOOD LEFT HAND   Final   Special Requests BOTTLES DRAWN AEROBIC AND ANAEROBIC 10CC   Final   Culture  Setup Time     Final   Value: 03/07/2013 20:29     Performed at [facility]   Culture     Final   Value:  BLOOD CULTURE RECEIVED NO GROWTH TO DATE CULTURE WILL BE HELD FOR 5 DAYS BEFORE ISSUING A FINAL NEGATIVE REPORT     Performed at [facility]   Report Status PENDING   Incomplete  MRSA PCR SCREENING     Status: None   Collection Time    03/09/13  1:24 AM      Result Value Range Status   MRSA by PCR NEGATIVE  NEGATIVE Final   Comment:            The GeneXpert MRSA Assay (FDA     approved for NASAL specimens     only), is one component of a     comprehensive MRSA colonization     surveillance program. It is not     intended to diagnose MRSA     infection nor to guide or     monitor treatment for     MRSA infections.  URINE CULTURE     Status: None   Collection Time    03/09/13  1:24 AM      Result Value Range Status   Specimen Description URINE, CLEAN CATCH   Final   Special Requests CX ADDED AT 0144 ON [id number]   Final   Culture  Setup Time     Final   Value: 03/09/2013 01:54     Performed at [facility]   Colony Count     Final   Value: 3,000 COLONIES/ML     Performed at [facility]   Culture     Final   Value: INSIGNIFICANT GROWTH     Performed at [facility]   Report Status 03/10/2013 FINAL   Final    Studies/Results: Dg Chest 2 View  03/08/2013   CLINICAL DATA:  67-year- female with shortness of  breath and weakness. LVAD. Initial encounter.  EXAM: CHEST  2 VIEW  COMPARISON:  Chest CT from the same day and earlier.  FINDINGS: Small bilateral pleural effusions, better demonstrated on the earlier study today. LVAD remains in place. Left chest cardiac AICD. Stable cardiac size and mediastinal contours. Perihilar atelectasis. No pneumothorax or edema. Right PICC line in place. Visualized tracheal air column is within normal limits. No acute osseous abnormality identified.  IMPRESSION: Small bilateral pleural effusions and atelectasis. LVAD. See also chest CT from today reported separately.   Electronically Signed   By: Augusto Gamble M.D.   On: 03/08/2013 22:42   US Abdomen Complete  03/10/2013   CLINICAL DATA:  Abdominal pain, leukocytosis  EXAM: ULTRASOUND ABDOMEN COMPLETE  COMPARISON:  None.  FINDINGS: Gallbladder  No gallstones, gallbladder wall thickening, or pericholecystic fluid. Negative sonographic Murphy's sign.  Common bile duct  Diameter: 5 mm.  Liver  Coarse hepatic echotexture. Left hepatic lobe is poorly visualized. No focal hepatic lesion is seen.  IVC  Poorly visualized.  Pancreas  Not visualized due to overlying bowel gas.  Spleen  Measures 6.1 cm.  Right Kidney  Length:  9.5 cm.  Poorly visualized. No hydronephrosis.  Left Kidney  Length: 10.8 cm.  No mass or hydronephrosis.  Abdominal aorta  No aneurysm visualized.  Additional comments: Bilateral pleural effusions.  IMPRESSION: Bilateral pleural effusions.  Otherwise negative abdominal ultrasound.   Electronically Signed   By: Charline Bills M.D.   On: 03/10/2013 10:05    Assessment: She remains afebrile with negative blood cultures. I agree with transesophageal echocardiogram on Monday. If there is no evidence of endocarditis and cultures remain negative I would  consider stopping empiric antibiotics at that time.  Plan: 1. Continue antibiotics pending results of the final cultures and TEE 2. We will followup on Monday  Cliffton Asters, MD [facility] [phone] pager   [phone] cell 03/10/2013, 2:21 PM

## 2013-03-11 LAB — CBC WITH DIFFERENTIAL/PLATELET
HCT: 31.8 % — ABNORMAL LOW (ref 36.0–46.0)
Hemoglobin: 10.3 g/dL — ABNORMAL LOW (ref 12.0–15.0)
Lymphocytes Relative: 10 % — ABNORMAL LOW (ref 12–46)
MCHC: 32.4 g/dL (ref 30.0–36.0)
Monocytes Absolute: 0.8 10*3/uL (ref 0.1–1.0)
Monocytes Relative: 8 % (ref 3–12)
Neutro Abs: 7.7 10*3/uL (ref 1.7–7.7)
WBC: 9.5 10*3/uL (ref 4.0–10.5)

## 2013-03-11 LAB — PROTIME-INR: Prothrombin Time: 24.6 seconds — ABNORMAL HIGH (ref 11.6–15.2)

## 2013-03-11 LAB — BASIC METABOLIC PANEL
BUN: 22 mg/dL (ref 6–23)
CO2: 22 mEq/L (ref 19–32)
Chloride: 99 mEq/L (ref 96–112)
Creatinine, Ser: 1.4 mg/dL — ABNORMAL HIGH (ref 0.50–1.10)
GFR calc Af Amer: 45 mL/min — ABNORMAL LOW (ref >90)
Potassium: 4.7 mEq/L (ref 3.5–5.1)

## 2013-03-11 MED ORDER — ALPRAZOLAM 0.25 MG PO TABS
0.5000 mg | ORAL_TABLET | Freq: Four times a day (QID) | ORAL | Status: DC | PRN
  Administered 2013-03-11 – 2013-03-23 (×18): 0.5 mg via ORAL
  Filled 2013-03-11 (×7): qty 2
  Filled 2013-03-11 (×2): qty 1
  Filled 2013-03-11 (×2): qty 2
  Filled 2013-03-11 (×3): qty 1
  Filled 2013-03-11 (×2): qty 2
  Filled 2013-03-11 (×2): qty 1
  Filled 2013-03-11 (×2): qty 2
  Filled 2013-03-11: qty 1

## 2013-03-11 MED ORDER — WARFARIN SODIUM 2.5 MG PO TABS
2.5000 mg | ORAL_TABLET | Freq: Once | ORAL | Status: AC
  Administered 2013-03-11: 2.5 mg via ORAL
  Filled 2013-03-11: qty 1

## 2013-03-11 NOTE — Progress Notes (Signed)
Advanced Heart Failure Team History and Physical Note    HPI:    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 Post-op couse c/b RHF requiring take back to the OR for bleeding and temproray RVAD support.   Admitted 10/12-10/14/14 for fatigue. Concern was for low output due to RHF. Echo with severe RV dysfunction and severe TR. However, RHC with relatively well preserved output.  RA = 17  RV = 35/2/12  PA = 35/15 (22)  PCW = 11  Fick cardiac output/index = 5.4/3.1  Thermo CO/CI = 3.7/2.1  PVR = 2.0 WU (Fick)  FA sat = assumed 98%  PA sat = 66%, 70%, 70%  RA sat = 76%  CT C/A/P showed persistent air around around driveline as well as small loculated R pleural effusion ? possible empyema. PICC placed and started on abx vanc/ceftaz and rifampin.   Admitted 11/6 with nausea.   Feeling better but still weak. Having trouble sleeping.  Intermittent CHB. ESR 15. Denies dyspnea or orthopnea. No fevers or chills. RUQ u/s 11/8 was normal.   Echo reviewed personally. Unable to see valves well. RV markedly dilated and severely hypokinetic. Septal flattening c/w RV pressure volume overload.   VAD interrogation  RPM 8600 Flow 5.4L Power 5.0 PI 5.9  3-5 PI events    Objective:    Vital Signs:   Temp:  [98 F (36.7 C)-98.5 F (36.9 C)] 98.4 F (36.9 C) (11/09 0807) Pulse Rate:  [71-94] 83 (11/09 1000) Resp:  [18-37] 37 (11/09 1000) SpO2:  [89 %-94 %] 93 % (11/09 1000) Weight:  [68.8 kg (151 lb 10.8 oz)] 68.8 kg (151 lb 10.8 oz) (11/09 0600)   Filed Weights   03/09/13 0500 03/10/13 0500 03/11/13 0600  Weight: 68.2 kg (150 lb 5.7 oz) 69.5 kg (153 lb 3.5 oz) 68.8 kg (151 lb 10.8 oz)    Physical Exam: GENERAL: Fatigued appearing,no acute distress; HEENT: normal  NECK: Supple, JVP 9-10 . 2+  bilaterally, no bruits. No lymphadenopathy or thyromegaly appreciated.  CARDIAC: Mechanical heart sounds with LVAD hum present.  LUNGS: Clear to auscultation bilaterally.  ABDOMEN: Soft, round, nontender, positive bowel sounds x4.  LVAD exit site: well-healed and incorporated. Dressing dry and intact. No erythema or drainage. Stabilization device present and accurately applied. No tenderness to palpation at all along driveline tract.  EXTREMITIES:  no cyanosis, clubbing, rash. tr edema  NEUROLOGIC: Alert and oriented x 4. Gait steady. No aphasia. No dysarthria. Affect pleasant.     Labs: Basic Metabolic Panel:  Recent Labs Lab 03/07/13 1307 03/08/13 2155 03/09/13 1000 03/11/13 0403  NA 134* 136 135 133*  K 4.4 5.0 3.8 4.7  CL 98 99 98 99  CO2 18* 20 25 22   GLUCOSE 155* 157* 88 100*  BUN 22 28* 23 22  CREATININE 1.81* 2.31* 1.66* 1.40*  CALCIUM 10.5 10.3 9.5 9.9    Liver Function Tests:  Recent Labs Lab 03/08/13 2155 03/09/13 1000  AST 22 20  ALT 22 19  ALKPHOS 104 86  BILITOT 0.8 1.5*  PROT 7.8 7.0  ALBUMIN 4.6 4.0   No results found for this basename: LIPASE, AMYLASE,  in the last 168 hours No results found for this basename: AMMONIA,  in the last 168 hours  CBC:  Recent Labs Lab 03/07/13 1307 03/08/13 2155 03/09/13 1000 03/11/13 0403  WBC 14.0* 12.4* 9.7 9.5  NEUTROABS 11.6* 10.1*  --  7.7  HGB 11.3* 10.5* 9.5* 10.3*  HCT 34.4* 32.7* 29.4* 31.8*  MCV 87.3 87.4 87.2 86.6  PLT 310 303 257 273    Cardiac Enzymes: No results found for this basename: CKTOTAL, CKMB, CKMBINDEX, TROPONINI,  in the last 168 hours  BNP: BNP (last 3 results)  Recent Labs  02/28/13 1423 03/07/13 1307 03/08/13 2155  PROBNP 2290.0* 1875.0* 2459.0*    CBG: No results found for this basename: GLUCAP,  in the last 168 hours  Coagulation Studies:  Recent Labs  03/08/13 2155 03/09/13 0425 03/10/13 0438 03/11/13 0403  LABPROT 24.1* 24.7* 25.1* 24.6*  INR 2.24*  2.32* 2.37* 2.31*    Other results: EKG: SR with Mobitz Type II HB.  Imaging: US Abdomen Complete  03/10/2013   CLINICAL DATA:  Abdominal pain, leukocytosis  EXAM: ULTRASOUND ABDOMEN COMPLETE  COMPARISON:  None.  FINDINGS: Gallbladder  No gallstones, gallbladder wall thickening, or pericholecystic fluid. Negative sonographic Murphy's sign.  Common bile duct  Diameter: 5 mm.  Liver  Coarse hepatic echotexture. Left hepatic lobe is poorly visualized. No focal hepatic lesion is seen.  IVC  Poorly visualized.  Pancreas  Not visualized due to overlying bowel gas.  Spleen  Measures 6.1 cm.  Right Kidney  Length:  9.5 cm.  Poorly visualized. No hydronephrosis.  Left Kidney  Length: 10.8 cm.  No mass or hydronephrosis.  Abdominal aorta  No aneurysm visualized.  Additional comments: Bilateral pleural effusions.  IMPRESSION: Bilateral pleural effusions.  Otherwise negative abdominal ultrasound.   Electronically Signed   By: Charline Bills M.D.   On: 03/10/2013 10:05        Assessment:   1. Nausea 2. Dypnea 3. Chronic biventricular HF Status post HM-II LVAD implantation.  4. PAF  5. Recent VT s/p ICD shock on amio 6. R pleural effusion - ? Empyema 7. ? driveline infection 8. Leukocytosis 9. Transient CHB 10. Chronic renal failure, stage III  Plan/Discussion:    Feeling ok but still weak. Cultures remain negative ID recommends stopping abx and considering TEE.  Renal function improved. WBC falling slightly but still with L shift.   After reviewing echo, I am even more convinced that her symptoms are due to progressive RHF (despite lack of PI events on VAD) and I suspect she will need milrinone support at home however will need RHC numbers to support this.  Will continue current regimen and plan TEE (to evaluate for endocarditis or ICD infection) and RHC tomorrow. Order written.   Needs rest. No vital checks 11p-6a unless clinically necessary. Ambien to help sleep.   Salah Nakamura,MD 10:28 AM

## 2013-03-11 NOTE — Progress Notes (Signed)
Pt c/o anxiety, became tearful and is feeling SOB. Her breath sounds are clear bilaterally, O2 saturations are 94% on RA, and vitals are otherwise stable. MD Bensimhon made aware. Order for PRN Xanax received.   Will monitor  Kaitlin [name]

## 2013-03-11 NOTE — Progress Notes (Signed)
ANTICOAGULATION CONSULT NOTE   Pharmacy Consult for Coumadin Indication: LVAD  No Known Allergies  Patient Measurements: Height: 5\' 3"  (160 cm) Weight: 151 lb 10.8 oz (68.8 kg) IBW/kg (Calculated) : 52.4  Vital Signs: Temp: 98.5 F (36.9 C) (11/09 1145) Temp src: Oral (11/09 1145) Pulse Rate: 80 (11/09 1200)  Labs:  Recent Labs  03/08/13 2155 03/09/13 0425 03/09/13 1000 03/10/13 0438 03/11/13 0403  HGB 10.5*  --  9.5*  --  10.3*  HCT 32.7*  --  29.4*  --  31.8*  PLT 303  --  257  --  273  LABPROT 24.1* 24.7*  --  25.1* 24.6*  INR 2.24* 2.32*  --  2.37* 2.31*  CREATININE 2.31*  --  1.66*  --  1.40*    Estimated Creatinine Clearance: 37.3 ml/min (by C-G formula based on Cr of 1.4).  Assessment: 67yof s/p LVAD implant 12/2012. She presents with possible empyema/LVAD infection.  Initially started on broad spectrum abx - now off per ID recommendations.   Pt on coumadin for anticoagulation for LVAD. INR 2.31 (therapeutic) - appears stable on home dose. Hgb stable. No bleeding noted. 11/8 Spoke with Dr. Gala Romney re: RHC on Monday and whether coumadin needs to be held. He said he is ok to do procedure as long as INR stays under 3  Home dose Coumadin 5mg  MWF, 2.5mg  TTSS  Goal of Therapy:  INR 2-3 Monitor platelets by anticoagulation protocol: Yes   Plan:  Will give warfarin 2.5mg  tonight - if INR stable again tomorrow, consider restart home dose Daily INR  Christoper Fabian, PharmD, BCPS Clinical pharmacist, pager [phone] 03/11/2013 12:53 PM

## 2013-03-12 ENCOUNTER — Encounter (HOSPITAL_COMMUNITY)
Admission: EM | Disposition: A | Discharge status: Other Acute Inpatient Hospital | Payer: Self-pay | Source: Home / Self Care | Attending: Internal Medicine

## 2013-03-12 DIAGNOSIS — T82897A Other specified complication of cardiac prosthetic devices, implants and grafts, initial encounter: Secondary | ICD-10-CM

## 2013-03-12 DIAGNOSIS — J90 Pleural effusion, not elsewhere classified: Secondary | ICD-10-CM

## 2013-03-12 DIAGNOSIS — I369 Nonrheumatic tricuspid valve disorder, unspecified: Secondary | ICD-10-CM

## 2013-03-12 DIAGNOSIS — I509 Heart failure, unspecified: Secondary | ICD-10-CM

## 2013-03-12 DIAGNOSIS — I4891 Unspecified atrial fibrillation: Secondary | ICD-10-CM

## 2013-03-12 HISTORY — PX: RIGHT HEART CATHETERIZATION: SHX5447

## 2013-03-12 LAB — POCT I-STAT 3, VENOUS BLOOD GAS (G3P V)
Acid-base deficit: 6 mmol/L — ABNORMAL HIGH (ref 0.0–2.0)
Bicarbonate: 18.6 mEq/L — ABNORMAL LOW (ref 20.0–24.0)
Bicarbonate: 21.2 mEq/L (ref 20.0–24.0)
O2 Saturation: 63 %
TCO2: 20 mmol/L (ref 0–100)
pCO2, Ven: 33.8 mmHg — ABNORMAL LOW (ref 45.0–50.0)
pCO2, Ven: 38.6 mmHg — ABNORMAL LOW (ref 45.0–50.0)
pH, Ven: 7.348 — ABNORMAL HIGH (ref 7.250–7.300)
pH, Ven: 7.349 — ABNORMAL HIGH (ref 7.250–7.300)
pO2, Ven: 34 mmHg (ref 30.0–45.0)

## 2013-03-12 LAB — CBC WITH DIFFERENTIAL/PLATELET
Basophils Absolute: 0 10*3/uL (ref 0.0–0.1)
Basophils Relative: 0 % (ref 0–1)
Eosinophils Absolute: 0.1 10*3/uL (ref 0.0–0.7)
Hemoglobin: 10.1 g/dL — ABNORMAL LOW (ref 12.0–15.0)
MCH: 27.9 pg (ref 26.0–34.0)
MCHC: 32 g/dL (ref 30.0–36.0)
Monocytes Relative: 8 % (ref 3–12)
Neutro Abs: 7.1 10*3/uL (ref 1.7–7.7)
Neutrophils Relative %: 78 % — ABNORMAL HIGH (ref 43–77)
Platelets: 268 10*3/uL (ref 150–400)
RDW: 17.1 % — ABNORMAL HIGH (ref 11.5–15.5)

## 2013-03-12 LAB — BASIC METABOLIC PANEL
BUN: 20 mg/dL (ref 6–23)
Calcium: 10.1 mg/dL (ref 8.4–10.5)
Chloride: 100 mEq/L (ref 96–112)
Creatinine, Ser: 1.24 mg/dL — ABNORMAL HIGH (ref 0.50–1.10)
GFR calc Af Amer: 52 mL/min — ABNORMAL LOW (ref >90)
GFR calc non Af Amer: 45 mL/min — ABNORMAL LOW (ref >90)
Glucose, Bld: 90 mg/dL (ref 70–99)
Potassium: 4.6 mEq/L (ref 3.5–5.1)

## 2013-03-12 LAB — PROTIME-INR: Prothrombin Time: 19.5 seconds — ABNORMAL HIGH (ref 11.6–15.2)

## 2013-03-12 SURGERY — RIGHT HEART CATH
Anesthesia: LOCAL

## 2013-03-12 MED ORDER — SODIUM CHLORIDE 0.9 % IV SOLN: 250.0000 mL | INTRAVENOUS | Status: DC | PRN

## 2013-03-12 MED ORDER — ASPIRIN 81 MG PO CHEW: 81.0000 mg | CHEWABLE_TABLET | ORAL | Status: DC

## 2013-03-12 MED ORDER — MIDAZOLAM HCL 2 MG/2ML IJ SOLN
INTRAMUSCULAR | Status: AC
  Administered 2013-03-12: 5 mg via INTRAVENOUS
  Filled 2013-03-12: qty 6

## 2013-03-12 MED ORDER — FENTANYL CITRATE 0.05 MG/ML IJ SOLN
75.0000 ug | Freq: Once | INTRAMUSCULAR | Status: AC
  Administered 2013-03-12: 75 ug via INTRAVENOUS

## 2013-03-12 MED ORDER — SODIUM CHLORIDE 0.9 % IJ SOLN: 3.0000 mL | INTRAMUSCULAR | Status: DC | PRN

## 2013-03-12 MED ORDER — SODIUM CHLORIDE 0.9 % IV SOLN: INTRAVENOUS | Status: DC

## 2013-03-12 MED ORDER — LIDOCAINE VISCOUS 2 % MT SOLN
OROMUCOSAL | Status: AC
  Filled 2013-03-12: qty 15

## 2013-03-12 MED ORDER — WARFARIN SODIUM 5 MG PO TABS
5.0000 mg | ORAL_TABLET | Freq: Once | ORAL | Status: AC
  Administered 2013-03-12: 5 mg via ORAL
  Filled 2013-03-12: qty 1

## 2013-03-12 MED ORDER — SODIUM CHLORIDE 0.9 % IJ SOLN
3.0000 mL | Freq: Two times a day (BID) | INTRAMUSCULAR | Status: DC
  Administered 2013-03-13 – 2013-03-23 (×3): 3 mL via INTRAVENOUS

## 2013-03-12 MED ORDER — ACETAMINOPHEN 325 MG PO TABS
650.0000 mg | ORAL_TABLET | ORAL | Status: DC | PRN
  Administered 2013-03-16 – 2013-03-23 (×6): 650 mg via ORAL
  Filled 2013-03-12 (×6): qty 2

## 2013-03-12 MED ORDER — HEPARIN (PORCINE) IN NACL 2-0.9 UNIT/ML-% IJ SOLN
INTRAMUSCULAR | Status: AC
  Filled 2013-03-12: qty 1000

## 2013-03-12 MED ORDER — SODIUM CHLORIDE 0.9 % IJ SOLN
10.0000 mL | INTRAMUSCULAR | Status: DC | PRN
  Administered 2013-03-23: 10 mL

## 2013-03-12 MED ORDER — SODIUM CHLORIDE 0.9 % IJ SOLN
10.0000 mL | Freq: Two times a day (BID) | INTRAMUSCULAR | Status: DC
  Administered 2013-03-12 – 2013-03-13 (×2): 10 mL
  Administered 2013-03-13: 20 mL
  Administered 2013-03-14 – 2013-03-17 (×5): 10 mL

## 2013-03-12 MED ORDER — MILRINONE IN DEXTROSE 20 MG/100ML IV SOLN
0.2500 ug/kg/min | INTRAVENOUS | Status: DC
  Administered 2013-03-12 – 2013-03-13 (×2): 0.25 ug/kg/min via INTRAVENOUS
  Filled 2013-03-12 (×2): qty 100

## 2013-03-12 MED ORDER — ONDANSETRON HCL 4 MG/2ML IJ SOLN: 4.0000 mg | Freq: Four times a day (QID) | INTRAMUSCULAR | Status: DC | PRN

## 2013-03-12 MED ORDER — FENTANYL CITRATE 0.05 MG/ML IJ SOLN
INTRAMUSCULAR | Status: AC
  Administered 2013-03-12: 75 ug via INTRAVENOUS
  Filled 2013-03-12: qty 2

## 2013-03-12 MED ORDER — SODIUM CHLORIDE 0.9 % IV SOLN
INTRAVENOUS | Status: DC
  Administered 2013-03-12: 15 mL/h via INTRAVENOUS
  Administered 2013-03-14 – 2013-03-16 (×2): via INTRAVENOUS
  Administered 2013-03-20: 15 mL/h via INTRAVENOUS
  Administered 2013-03-21: 10:00:00 via INTRAVENOUS

## 2013-03-12 MED ORDER — SODIUM CHLORIDE 0.9 % IJ SOLN: 3.0000 mL | Freq: Two times a day (BID) | INTRAMUSCULAR | Status: DC

## 2013-03-12 MED ORDER — LIDOCAINE HCL (PF) 1 % IJ SOLN
INTRAMUSCULAR | Status: AC
  Filled 2013-03-12: qty 30

## 2013-03-12 MED ORDER — SODIUM CHLORIDE 0.9 % IJ SOLN
3.0000 mL | Freq: Two times a day (BID) | INTRAMUSCULAR | Status: DC
  Administered 2013-03-12: 3 mL via INTRAVENOUS

## 2013-03-12 MED ORDER — MIDAZOLAM HCL 2 MG/2ML IJ SOLN
5.0000 mg | Freq: Once | INTRAMUSCULAR | Status: AC
  Administered 2013-03-12: 5 mg via INTRAVENOUS

## 2013-03-12 NOTE — Progress Notes (Signed)
[facility]    Subjective: No new complaints   Antibiotics:  Anti-infectives   Start     Dose/Rate Route Frequency Ordered Stop   03/09/13 2200  vancomycin (VANCOCIN) IVPB 750 mg/150 ml premix  Status:  Discontinued     750 mg 150 mL/hr over 60 Minutes Intravenous Every 24 hours 03/09/13 0950 03/09/13 1143   03/09/13 2200  vancomycin (VANCOCIN) IVPB 1000 mg/200 mL premix  Status:  Discontinued     1,000 mg 200 mL/hr over 60 Minutes Intravenous Every 24 hours 03/09/13 1143 03/10/13 1331   03/08/13 2300  vancomycin (VANCOCIN) IVPB 1000 mg/200 mL premix  Status:  Discontinued     1,000 mg 200 mL/hr over 60 Minutes Intravenous Every 24 hours 03/08/13 2216 03/09/13 0950   03/08/13 2300  cefTAZidime (FORTAZ) 1 g in dextrose 5 % 50 mL IVPB  Status:  Discontinued     1 g 100 mL/hr over 30 Minutes Intravenous Every 12 hours 03/08/13 2216 03/10/13 1331   03/08/13 2300  rifampin (RIFADIN) capsule 600 mg  Status:  Discontinued     600 mg Oral Daily at bedtime 03/08/13 2217 03/09/13 1618      Medications: Scheduled Meds: . amiodarone  100 mg Oral Daily  . amLODipine  10 mg Oral Daily  . aspirin EC  325 mg Oral Daily  . citalopram  20 mg Oral Daily  . digoxin  0.0625 mg Oral Daily  . feeding supplement (RESOURCE BREEZE)  1 Container Oral Q1500  . ferrous fumarate-b12-vitamic C-folic acid  1 capsule Oral TID PC  . hydrALAZINE  25 mg Oral TID  . pantoprazole  40 mg Oral Daily  . potassium chloride  10 mEq Oral TID  . sildenafil  40 mg Oral TID  . simvastatin  10 mg Oral QHS  . sodium chloride  3 mL Intravenous Q12H  . sodium chloride  3 mL Intravenous Q12H  . spironolactone  25 mg Oral Daily  . warfarin  5 mg Oral ONCE-1800  . Warfarin - Pharmacist Dosing Inpatient   Does not apply q1800   Continuous Infusions: . sodium chloride    . sodium chloride     PRN Meds:.sodium chloride, sodium chloride, acetaminophen, ALPRAZolam, hydrALAZINE, ondansetron,  sodium chloride, sodium chloride, zolpidem   Objective: Weight change: 3.5 oz (0.1 kg)  Intake/Output Summary (Last 24 hours) at 03/12/13 1430 Last data filed at 03/12/13 1000  Gross per 24 hour  Intake     60 ml  Output    750 ml  Net   -690 ml   Pulse 80, temperature 98.3 F (36.8 C), temperature source Oral, resp. rate 23, height 5\' 3"  (1.6 m), weight 151 lb 14.4 oz (68.9 kg), SpO2 95.00%. Temp:  [97.6 F (36.4 C)-98.4 F (36.9 C)] 98.3 F (36.8 C) (11/10 1212) Pulse Rate:  [60-94] 80 (11/10 1300) Resp:  [16-48] 23 (11/10 1300) SpO2:  [87 %-98 %] 95 % (11/10 1300) Weight:  [151 lb 14.4 oz (68.9 kg)] 151 lb 14.4 oz (68.9 kg) (11/10 0600)  Physical Exam: HEENT: anicteric sclera, pupils reactive to light and accommodation, EOMI, oropharynx clear and without exudate  [employer] LVAD hum heard  Chest: clear to auscultation bilaterally, no wheezing, rales or rhonchi  Skin: her PM pocket is warm or fluctuant or tender. Her sternotomy site is clean her abdomen has some bruising but no obvious fluctuance or purulence.  Abdomen: soft nontender, nondistended, normal bowel sounds,  Extremities: no ulcerations or  lesions are erythema  Neuro: nonfocal, strength and sensation intact  Lab Results:  Recent Labs  03/11/13 0403 03/12/13 0500  WBC 9.5 9.2  HGB 10.3* 10.1*  HCT 31.8* 31.6*  PLT 273 268    BMET  Recent Labs  03/11/13 0403 03/12/13 0500  NA 133* 132*  K 4.7 4.6  CL 99 100  CO2 22 23  GLUCOSE 100* 90  BUN 22 20  CREATININE 1.40* 1.24*  CALCIUM 9.9 10.1    Micro Results: Recent Results (from the past 240 hour(s))  CULTURE, BLOOD (ROUTINE X 2)     Status: None   Collection Time    03/07/13  3:00 PM      Result Value Range Status   Specimen Description BLOOD LEFT ARM   Final   Special Requests BOTTLES DRAWN AEROBIC AND ANAEROBIC 10CC   Final   Culture  Setup Time     Final   Value: 03/07/2013 20:28     Performed at [facility]   Culture     Final    Value:        BLOOD CULTURE RECEIVED NO GROWTH TO DATE CULTURE WILL BE HELD FOR 5 DAYS BEFORE ISSUING A FINAL NEGATIVE REPORT     Performed at [facility]   Report Status PENDING   Incomplete  CULTURE, BLOOD (ROUTINE X 2)     Status: None   Collection Time    03/07/13  3:08 PM      Result Value Range Status   Specimen Description BLOOD LEFT HAND   Final   Special Requests BOTTLES DRAWN AEROBIC AND ANAEROBIC 10CC   Final   Culture  Setup Time     Final   Value: 03/07/2013 20:29     Performed at [facility]   Culture     Final   Value:        BLOOD CULTURE RECEIVED NO GROWTH TO DATE CULTURE WILL BE HELD FOR 5 DAYS BEFORE ISSUING A FINAL NEGATIVE REPORT     Performed at [facility]   Report Status PENDING   Incomplete  MRSA PCR SCREENING     Status: None   Collection Time    03/09/13  1:24 AM      Result Value Range Status   MRSA by PCR NEGATIVE  NEGATIVE Final   Comment:            The GeneXpert MRSA Assay (FDA     approved for NASAL specimens     only), is one component of a     comprehensive MRSA colonization     surveillance program. It is not     intended to diagnose MRSA     infection nor to guide or     monitor treatment for     MRSA infections.  URINE CULTURE     Status: None   Collection Time    03/09/13  1:24 AM      Result Value Range Status   Specimen Description URINE, CLEAN CATCH   Final   Special Requests CX ADDED AT 0144 ON 161096   Final   Culture  Setup Time     Final   Value: 03/09/2013 01:54     Performed at [facility]   Colony Count     Final   Value: 3,000 COLONIES/ML     Performed at [facility]   Culture     Final   Value: INSIGNIFICANT GROWTH  Performed at [facility]   Report Status 03/10/2013 FINAL   Final    Studies/Results: No results found.    Assessment/Plan: Kaitlin [name] is a 67 y.o. female with LVAD and complicated course with recent concern for infection. The only  suspicious finding on initial CT scan was a lucency around LVAD, and a pleural effusion that looked to be loculated by CT findings. She was started on vancomycin and ceftazidime and rifampin although she likely only received one dose of her ceftaz and felt even worse and has been readmitted. CT shows stability of all of the a fore mentioned findings.    #1 Concern for infection in LVAD pt: I feel the patient Has already been extensively worked up and now  TEE also without vegetations on valves or leads  I agree withDC all of her abx adn observe her off of them and reassure her that we have looked for all reasonable possible suspects for bacterial infection   I will sign off for now.  Please call with further questions.   LOS: 4 days   Acey Lav 03/12/2013, 2:30 PM

## 2013-03-12 NOTE — CV Procedure (Signed)
Cardiac Cath Procedure Note:  Indication:  RHF  Procedures performed:  1) Right heart catheterization  Description of procedure:   The risks and indication of the procedure were explained. Consent was signed and placed on the chart. An appropriate timeout was taken prior to the procedure. The right neck was prepped and draped in the routine sterile fashion and anesthetized with 1% local lidocaine.   A 7 FR venous sheath was placed in the right internal jugular vein using a modified Seldinger technique. A standard Swan-Ganz catheter was used for the procedure.   Complications: None apparent.  Findings:  RA = 20 RV = 47/12/19 PA = 49/17 (32) PCW = 19 Fick cardiac output/index = 4.6/2.7 (using calculated O2 consumption of 228 ml/min) Fick CO/CI = 3.5/2.1 (uisng measured O2 consumption with metabolic cart 181 ml/min) Thermo CO/CI = 2.6/1.5 PVR =3.4 WU PA sat = 59%, 63% Ao sat 95%  Assessment/Plan:  She has worsening right heart failure s/p LVAD placement. Will need home milrinone and expedited evaluation for transplant.    Daniel Bensimhon 2:05 PM

## 2013-03-12 NOTE — Progress Notes (Signed)
[facility]  Patient Status: Active pt with [facility] up to this admission  [facility] is providing the following services: HH RN and home infusion pharmacy services for home IV ABX and management/care of LVAD at home from previous admission. [facility] hospital team will follow and support transition home when deemed appropriate by MD team.  If patient discharges after hours, please call [phone].   Sedalia Muta 03/12/2013, 10:24 AM

## 2013-03-12 NOTE — Progress Notes (Signed)
  Echocardiogram Echocardiogram Transesophageal has been performed.  Kaitlin [name] 03/12/2013, 12:59 PM

## 2013-03-12 NOTE — H&P (View-Only) (Signed)
Advanced Heart Failure Team History and Physical Note    HPI:    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 Post-op couse c/b RHF requiring take back to the OR for bleeding and temproray RVAD support.   Admitted 10/12-10/14/14 for fatigue. Concern was for low output due to RHF. Echo with severe RV dysfunction and severe TR. However, RHC with relatively well preserved output.  RA = 17  RV = 35/2/12  PA = 35/15 (22)  PCW = 11  Fick cardiac output/index = 5.4/3.1  Thermo CO/CI = 3.7/2.1  PVR = 2.0 WU (Fick)  FA sat = assumed 98%  PA sat = 66%, 70%, 70%  RA sat = 76%  CT C/A/P showed persistent air around around driveline as well as small loculated R pleural effusion ? possible empyema. PICC placed and started on abx vanc/ceftaz and rifampin.   Admitted 11/6 with nausea. Echo 03/09/13 Unable to see valves well. RV markedly dilated and severely hypokinetic. Septal flattening c/w RV pressure volume overload.   Stable overnight. Weight stable and 24 hr I/O -1.3 liters. Denies SOB or orthopnea. Complains of a mild pain across chest and describes it as tightness.   VAD interrogation  RPM 8600 Flow 6.0L Power 5.3 PI 5.1  3-5 Few PI events    Objective:    Vital Signs:   Temp:  [97.6 F (36.4 C)-98.5 F (36.9 C)] 98.2 F (36.8 C) (11/10 0810) Pulse Rate:  [60-88] 77 (11/10 0800) Resp:  [17-48] 31 (11/10 0800) SpO2:  [90 %-98 %] 93 % (11/10 0800) Weight:  [151 lb 14.4 oz (68.9 kg)] 151 lb 14.4 oz (68.9 kg) (11/10 0600) Last BM Date: 03/11/13 Filed Weights   03/10/13 0500 03/11/13 0600 03/12/13 0600  Weight: 153 lb 3.5 oz (69.5 kg) 151 lb 10.8 oz (68.8 kg) 151 lb 14.4 oz (68.9 kg)    Physical Exam: GENERAL: Fatigued appearing,no acute distress; HEENT: normal  NECK: Supple, JVP 8-9 . 2+  bilaterally, no bruits. No lymphadenopathy or thyromegaly appreciated.  CARDIAC: Mechanical heart sounds with LVAD hum present.  LUNGS: Clear to auscultation bilaterally.  ABDOMEN: Soft, round, nontender, positive bowel sounds x4.  LVAD exit site: well-healed and incorporated. Dressing dry and intact. No erythema or drainage. Stabilization device present and accurately applied. No tenderness to palpation at all along driveline tract.  EXTREMITIES:  no cyanosis, clubbing, rash. tr edema  NEUROLOGIC: Alert and oriented x 4. Gait steady. No aphasia. No dysarthria. Affect pleasant.     Labs: Basic Metabolic Panel:  Recent Labs Lab 03/07/13 1307 03/08/13 2155 03/09/13 1000 03/11/13 0403 03/12/13 0500  NA 134* 136 135 133* 132*  K 4.4 5.0 3.8 4.7 4.6  CL 98 99 98 99 100  CO2 18* 20 25 22 23   GLUCOSE 155* 157* 88 100* 90  BUN 22 28* 23 22 20   CREATININE 1.81* 2.31* 1.66* 1.40* 1.24*  CALCIUM 10.5 10.3 9.5 9.9 10.1    Liver Function Tests:  Recent Labs Lab 03/08/13 2155 03/09/13 1000  AST 22 20  ALT 22 19  ALKPHOS 104 86  BILITOT 0.8 1.5*  PROT 7.8 7.0  ALBUMIN 4.6 4.0   No results found for this basename: LIPASE, AMYLASE,  in the last 168 hours No results found for this basename:  AMMONIA,  in the last 168 hours  CBC:  Recent Labs Lab 03/07/13 1307 03/08/13 2155 03/09/13 1000 03/11/13 0403 03/12/13 0500  WBC 14.0* 12.4* 9.7 9.5 9.2  NEUTROABS 11.6* 10.1*  --  7.7 7.1  HGB 11.3* 10.5* 9.5* 10.3* 10.1*  HCT 34.4* 32.7* 29.4* 31.8* 31.6*  MCV 87.3 87.4 87.2 86.6 87.3  PLT 310 303 257 273 268    Cardiac Enzymes: No results found for this basename: CKTOTAL, CKMB, CKMBINDEX, TROPONINI,  in the last 168 hours  BNP: BNP (last 3 results)  Recent Labs  02/28/13 1423 03/07/13 1307 03/08/13 2155  PROBNP 2290.0* 1875.0* 2459.0*    CBG: No results found for this basename: GLUCAP,  in the last 168 hours  Coagulation Studies:  Recent Labs  03/10/13 0438  03/11/13 0403 03/12/13 0500  LABPROT 25.1* 24.6* 19.5*  INR 2.37* 2.31* 1.70*    Other results: EKG: SR with Mobitz Type II HB.  Imaging: No results found.      Assessment:   1. Nausea 2. Dypnea 3. Chronic biventricular HF Status post HM-II LVAD implantation.  4. PAF  5. Recent VT s/p ICD shock on amio 6. R pleural effusion - ? Empyema 7. ? driveline infection 8. Leukocytosis 9. Transient CHB 10. Chronic renal failure, stage III  Plan/Discussion:    Stable overnight. Feels much better this am and got some rest last night. Feeling ok but still weak. Weight stable and 24 hr I/O -1.3 liters. Cr improving and is 1.24 this am. Cultures remain negative will proceed with TEE today to assess valves.  MAP slightly elevated at 100 this am before medications were given, will reassess later whether we need to titrate anything.   Will go for RHC today to assess hemodynamics and RHF, may need milrinone. Will assess whether we need to start diuretics back after.   INR 1.7 this am will have pharmacy dose .  Continue to work CR.   Ulla Potash B,NP-C 10:38 AM   Patient seen and examined with Ulla Potash, NP. We discussed all aspects of the encounter. I agree with the assessment and plan as stated above.   She looks good today. Will plan TEE and RHC to further assess. Volume status ok.   Daniel Bensimhon,MD 12:54 PM

## 2013-03-12 NOTE — Interval H&P Note (Signed)
History and Physical Interval Note:  03/12/2013 2:05 PM  Kaitlin [name]  has presented today for surgery, with the diagnosis of hf  The various methods of treatment have been discussed with the patient and family. After consideration of risks, benefits and other options for treatment, the patient has consented to  Procedure(s): RIGHT HEART CATH (N/A) as a surgical intervention .  The patient's history has been reviewed, patient examined, no change in status, stable for surgery.  I have reviewed the patient's chart and labs.  Questions were answered to the patient's satisfaction.     Janna Oak

## 2013-03-12 NOTE — Progress Notes (Signed)
ANTICOAGULATION CONSULT NOTE   Pharmacy Consult for Coumadin Indication: LVAD  No Known Allergies  Patient Measurements: Height: 5\' 3"  (160 cm) Weight: 151 lb 14.4 oz (68.9 kg) IBW/kg (Calculated) : 52.4  Vital Signs: Temp: 98.2 F (36.8 C) (11/10 0810) Temp src: Oral (11/10 0810) Pulse Rate: 78 (11/10 1000)  Labs:  Recent Labs  03/10/13 0438 03/11/13 0403 03/12/13 0500  HGB  --  10.3* 10.1*  HCT  --  31.8* 31.6*  PLT  --  273 268  LABPROT 25.1* 24.6* 19.5*  INR 2.37* 2.31* 1.70*  CREATININE  --  1.40* 1.24*    Estimated Creatinine Clearance: 42.1 ml/min (by C-G formula based on Cr of 1.24).  Assessment: 67yof s/p LVAD implant 12/2012. She presents with possible empyema/LVAD infection.  Initially started on broad spectrum abx - now off per ID recommendations.   Pt on coumadin for anticoagulation for LVAD. INR down this morning after lower dosing over weekend to 1.7. Hgb stable. No bleeding noted. TEE/RHC planned for today to evaluate infection and output.  Home dose Coumadin 5mg  MWF, 2.5mg  TTSS  Goal of Therapy:  INR 2-3 Monitor platelets by anticoagulation protocol: Yes   Plan:  Will give warfarin 5mg  tonight  Daily INR  Sheppard Coil PharmD., BCPS Clinical Pharmacist Pager [phone] 03/12/2013 1:04 PM

## 2013-03-12 NOTE — Care Management Note (Unsigned)
    Page 1 of 2   03/22/2013     4:16:20 PM   CARE MANAGEMENT NOTE 03/22/2013  Patient:  Kaitlin [name], Kaitlin [name]   Account Number:  [id number]  Date Initiated:  03/12/2013  Documentation initiated by:  [facility]  Subjective/Objective Assessment:   Admitted fatigue- previous Lvad insertion. ??empyema or RHF.     Action/Plan:   Anticipated DC Date:  03/23/2013   Anticipated DC Plan:  ACUTE TO ACUTE TRANS      DC Planning Services  CM consult      [facility]  HOME HEALTH   Choice offered to / List presented to:  C-1 Patient   DME arranged  IV PUMP/EQUIPMENT      DME agency  [facility]     [facility] arranged  HH-1 RN  HH-10 DISEASE MANAGEMENT  HH-2 PT      HH agency  [facility]   Status of service:  In process, will continue to follow Medicare Important Message given?   (If response is "NO", the following Medicare IM given date fields will be blank) Date Medicare IM given:   Date Additional Medicare IM given:    Discharge Disposition:    Per UR Regulation:  Reviewed for med. necessity/level of care/duration of stay  If discussed at Long Length of Stay Meetings, dates discussed:   03/13/2013  03/20/2013  03/22/2013    Comments:  03/22/13 Kaitlin Butsch,RN,BSN [phone] PT WITH PERSISTENT HEART FAILURE; MD PLANNING TO TRANSFER PT TO [facility] FOR TRANSPLANT EVALUATION, LIKELY 11/21.  03/21/13 Kaitlin Tonkinson,RN,BSN [phone] PT NOW ON LASIX DRIP IN ADDITION TO DOBUTAMINE.  [facility] STANDING BY FOR HOME DOBUTAMINE, WHEN DC DATE IS KNOWN. WILL FOLLOW.  03-16-13 3:20pm Kaitlin [name], RNBSN [phone] Paperwork for dobutamine filled out - [facility] notfied - will send in paperwork.  Possibly dc tomorrow.  W/E case manager updated.  Will need to let [facility] know early if dcing home so can prepare drip and get it in for set up prior to dc.  03-15-13 2:20pm Kaitlin [name], RNBSN - [phone] Changed Milrinone to dobutamine.  [facility] notified.  NP working with insurance and  paperwork to make sure covered with HH. Awaiting decision.  [facility] is working with daughter on what home dobutamine entails as far as care.  CM will continue to follow.  03-13-13 11am Kaitlin [name], RNBSN [phone] Plan for dc home with Milrinone drip.  Talked with patient about HH to assist.  Has had [facility] in past and would like to continue with them.  Patient wanted to know how much [facility] would be there to assist and what this entails.  States daughter will not want to assist.  [facility] called to talk with patient about details.  03-12-13 9am Kaitlin [name], RNBSN [phone] Previously home with [facility] and family.  Probable need for Milrinone on discharge - CM will continue to follow.  For TEE and RHC tomorrow.

## 2013-03-12 NOTE — Progress Notes (Signed)
Advanced Heart Failure Team History and Physical Note    HPI:    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 Post-op couse c/b RHF requiring take back to the OR for bleeding and temproray RVAD support.   Admitted 10/12-10/14/14 for fatigue. Concern was for low output due to RHF. Echo with severe RV dysfunction and severe TR. However, RHC with relatively well preserved output.  RA = 17  RV = 35/2/12  PA = 35/15 (22)  PCW = 11  Fick cardiac output/index = 5.4/3.1  Thermo CO/CI = 3.7/2.1  PVR = 2.0 WU (Fick)  FA sat = assumed 98%  PA sat = 66%, 70%, 70%  RA sat = 76%  CT C/A/P showed persistent air around around driveline as well as small loculated R pleural effusion ? possible empyema. PICC placed and started on abx vanc/ceftaz and rifampin.   Admitted 11/6 with nausea. Echo 03/09/13 Unable to see valves well. RV markedly dilated and severely hypokinetic. Septal flattening c/w RV pressure volume overload.   Stable overnight. Weight stable and 24 hr I/O -1.3 liters. Denies SOB or orthopnea. Complains of a mild pain across chest and describes it as tightness.   VAD interrogation  RPM 8600 Flow 6.0L Power 5.3 PI 5.1  3-5 Few PI events    Objective:    Vital Signs:   Temp:  [97.6 F (36.4 C)-98.5 F (36.9 C)] 98.2 F (36.8 C) (11/10 0810) Pulse Rate:  [60-88] 77 (11/10 0800) Resp:  [17-48] 31 (11/10 0800) SpO2:  [90 %-98 %] 93 % (11/10 0800) Weight:  [151 lb 14.4 oz (68.9 kg)] 151 lb 14.4 oz (68.9 kg) (11/10 0600) Last BM Date: 03/11/13 Filed Weights   03/10/13 0500 03/11/13 0600 03/12/13 0600  Weight: 153 lb 3.5 oz (69.5 kg) 151 lb 10.8 oz (68.8 kg) 151 lb 14.4 oz (68.9 kg)    Physical Exam: GENERAL: Fatigued appearing,no acute distress; HEENT: normal  NECK: Supple, JVP 8-9 . 2+  bilaterally, no bruits. No lymphadenopathy or thyromegaly appreciated.  CARDIAC: Mechanical heart sounds with LVAD hum present.  LUNGS: Clear to auscultation bilaterally.  ABDOMEN: Soft, round, nontender, positive bowel sounds x4.  LVAD exit site: well-healed and incorporated. Dressing dry and intact. No erythema or drainage. Stabilization device present and accurately applied. No tenderness to palpation at all along driveline tract.  EXTREMITIES:  no cyanosis, clubbing, rash. tr edema  NEUROLOGIC: Alert and oriented x 4. Gait steady. No aphasia. No dysarthria. Affect pleasant.     Labs: Basic Metabolic Panel:  Recent Labs Lab 03/07/13 1307 03/08/13 2155 03/09/13 1000 03/11/13 0403 03/12/13 0500  NA 134* 136 135 133* 132*  K 4.4 5.0 3.8 4.7 4.6  CL 98 99 98 99 100  CO2 18* 20 25 22 23  GLUCOSE 155* 157* 88 100* 90  BUN 22 28* 23 22 20  CREATININE 1.81* 2.31* 1.66* 1.40* 1.24*  CALCIUM 10.5 10.3 9.5 9.9 10.1    Liver Function Tests:  Recent Labs Lab 03/08/13 2155 03/09/13 1000  AST 22 20  ALT 22 19  ALKPHOS 104 86  BILITOT 0.8 1.5*  PROT 7.8 7.0  ALBUMIN 4.6 4.0   No results found for this basename: LIPASE, AMYLASE,  in the last 168 hours No results found for this basename:   AMMONIA,  in the last 168 hours  CBC:  Recent Labs Lab 03/07/13 1307 03/08/13 2155 03/09/13 1000 03/11/13 0403 03/12/13 0500  WBC 14.0* 12.4* 9.7 9.5 9.2  NEUTROABS 11.6* 10.1*  --  7.7 7.1  HGB 11.3* 10.5* 9.5* 10.3* 10.1*  HCT 34.4* 32.7* 29.4* 31.8* 31.6*  MCV 87.3 87.4 87.2 86.6 87.3  PLT 310 303 257 273 268    Cardiac Enzymes: No results found for this basename: CKTOTAL, CKMB, CKMBINDEX, TROPONINI,  in the last 168 hours  BNP: BNP (last 3 results)  Recent Labs  02/28/13 1423 03/07/13 1307 03/08/13 2155  PROBNP 2290.0* 1875.0* 2459.0*    CBG: No results found for this basename: GLUCAP,  in the last 168 hours  Coagulation Studies:  Recent Labs  03/10/13 0438  03/11/13 0403 03/12/13 0500  LABPROT 25.1* 24.6* 19.5*  INR 2.37* 2.31* 1.70*    Other results: EKG: SR with Mobitz Type II HB.  Imaging: No results found.      Assessment:   1. Nausea 2. Dypnea 3. Chronic biventricular HF Status post HM-II LVAD implantation.  4. PAF  5. Recent VT s/p ICD shock on amio 6. R pleural effusion - ? Empyema 7. ? driveline infection 8. Leukocytosis 9. Transient CHB 10. Chronic renal failure, stage III  Plan/Discussion:    Stable overnight. Feels much better this am and got some rest last night. Feeling ok but still weak. Weight stable and 24 hr I/O -1.3 liters. Cr improving and is 1.24 this am. Cultures remain negative will proceed with TEE today to assess valves.  MAP slightly elevated at 100 this am before medications were given, will reassess later whether we need to titrate anything.   Will go for RHC today to assess hemodynamics and RHF, may need milrinone. Will assess whether we need to start diuretics back after.   INR 1.7 this am will have pharmacy dose .  Continue to work CR.   Cosgrove, Ali B,NP-C 10:38 AM   Patient seen and examined with Ali Cosgrove, NP. We discussed all aspects of the encounter. I agree with the assessment and plan as stated above.   She looks good today. Will plan TEE and RHC to further assess. Volume status ok.   Fredrich Cory,MD 12:54 PM    

## 2013-03-12 NOTE — CV Procedure (Signed)
    TRANSESOPHAGEAL ECHOCARDIOGRAM   NAME:  Kaitlin [name]   MRN: [id number] DOB:  [birth year]   ADMIT DATE: 03/08/2013  INDICATIONS: Leukocytosis. Rule out endocarditis   PROCEDURE:   Informed consent was obtained prior to the procedure. The risks, benefits and alternatives for the procedure were discussed and the patient comprehended these risks.  Risks include, but are not limited to, cough, sore throat, vomiting, nausea, somnolence, esophageal and stomach trauma or perforation, bleeding, low blood pressure, aspiration, pneumonia, infection, trauma to the teeth and death.    After a procedural time-out, the patient was given 5 mg versed and 75 mcg fentanyl for moderate sedation.  The oropharynx was anesthetized 10 cc of topical 1% viscous lidocaine.  The transesophageal probe was inserted in the esophagus and stomach without difficulty and multiple views were obtained.    COMPLICATIONS:    There were no immediate complications.  FINDINGS:  LEFT VENTRICLE: Dilated.  EF = 30% Global HK. VAD cannula present.  RIGHT VENTRICLE: Dilated. Severely hypokinetic. ICD wire with no vegetation.   LEFT ATRIUM: Dilated  LEFT ATRIAL APPENDAGE: Not visualized  RIGHT ATRIUM: Dilated. Tip of PICC in RA. No vegetation. IAS bows R to left.   AORTIC VALVE:  Trileaflet. Not opening. No vegetation. Trivial AI.   MITRAL VALVE:    Normal. No vegetation. Very mild MR.   TRICUSPID VALVE: s/p ring repair. Moderate to severe TR. No vegetation.   PULMONIC VALVE: Grossly normal.   INTERATRIAL SEPTUM: No PFO or ASD.  DESCENDING AORTA: Moderate diffuse plaque.   CONCLUSION:   No TEE evidence of endocarditis.   Maimuna Leaman,MD 12:59 PM

## 2013-03-13 ENCOUNTER — Inpatient Hospital Stay (HOSPITAL_COMMUNITY): Payer: Medicare Other

## 2013-03-13 DIAGNOSIS — R570 Cardiogenic shock: Secondary | ICD-10-CM

## 2013-03-13 DIAGNOSIS — R5381 Other malaise: Secondary | ICD-10-CM

## 2013-03-13 DIAGNOSIS — Z515 Encounter for palliative care: Secondary | ICD-10-CM

## 2013-03-13 LAB — CBC WITH DIFFERENTIAL/PLATELET
Basophils Absolute: 0 10*3/uL (ref 0.0–0.1)
Basophils Relative: 0 % (ref 0–1)
Eosinophils Absolute: 0.2 10*3/uL (ref 0.0–0.7)
Eosinophils Relative: 2 % (ref 0–5)
Hemoglobin: 10 g/dL — ABNORMAL LOW (ref 12.0–15.0)
Lymphs Abs: 1.1 10*3/uL (ref 0.7–4.0)
MCH: 28.2 pg (ref 26.0–34.0)
MCHC: 32.4 g/dL (ref 30.0–36.0)
MCV: 87 fL (ref 78.0–100.0)
Monocytes Relative: 11 % (ref 3–12)
Neutro Abs: 6.8 10*3/uL (ref 1.7–7.7)
Neutrophils Relative %: 75 % (ref 43–77)
Platelets: 308 10*3/uL (ref 150–400)
RDW: 16.8 % — ABNORMAL HIGH (ref 11.5–15.5)
WBC: 9.1 10*3/uL (ref 4.0–10.5)

## 2013-03-13 LAB — BASIC METABOLIC PANEL
CO2: 21 mEq/L (ref 19–32)
Calcium: 10 mg/dL (ref 8.4–10.5)
Creatinine, Ser: 1.25 mg/dL — ABNORMAL HIGH (ref 0.50–1.10)
GFR calc Af Amer: 51 mL/min — ABNORMAL LOW (ref >90)
GFR calc non Af Amer: 44 mL/min — ABNORMAL LOW (ref >90)
Glucose, Bld: 88 mg/dL (ref 70–99)

## 2013-03-13 LAB — CARBOXYHEMOGLOBIN
Methemoglobin: 1.2 % (ref 0.0–1.5)
Total hemoglobin: 10 g/dL — ABNORMAL LOW (ref 12.0–16.0)
Total hemoglobin: 10.2 g/dL — ABNORMAL LOW (ref 12.0–16.0)

## 2013-03-13 LAB — CULTURE, BLOOD (ROUTINE X 2): Culture: NO GROWTH

## 2013-03-13 LAB — PROTIME-INR: INR: 1.51 — ABNORMAL HIGH (ref 0.00–1.49)

## 2013-03-13 MED ORDER — SIMVASTATIN 10 MG PO TABS
10.0000 mg | ORAL_TABLET | Freq: Every day | ORAL | Status: DC
  Administered 2013-03-13 – 2013-03-22 (×11): 10 mg via ORAL
  Filled 2013-03-13 (×12): qty 1

## 2013-03-13 MED ORDER — ENOXAPARIN SODIUM 80 MG/0.8ML ~~LOC~~ SOLN
1.0000 mg/kg | Freq: Two times a day (BID) | SUBCUTANEOUS | Status: DC
  Administered 2013-03-13 – 2013-03-14 (×4): 70 mg via SUBCUTANEOUS
  Filled 2013-03-13 (×7): qty 0.8

## 2013-03-13 MED ORDER — AMLODIPINE BESYLATE 10 MG PO TABS
10.0000 mg | ORAL_TABLET | Freq: Every day | ORAL | Status: DC
  Administered 2013-03-13 – 2013-03-23 (×11): 10 mg via ORAL
  Filled 2013-03-13 (×12): qty 1

## 2013-03-13 MED ORDER — HEPARIN (PORCINE) IN NACL 100-0.45 UNIT/ML-% IJ SOLN
1000.0000 [IU]/h | INTRAMUSCULAR | Status: DC
  Filled 2013-03-13: qty 250

## 2013-03-13 MED ORDER — SPIRONOLACTONE 25 MG PO TABS
25.0000 mg | ORAL_TABLET | Freq: Every day | ORAL | Status: DC
  Administered 2013-03-13 – 2013-03-23 (×11): 25 mg via ORAL
  Filled 2013-03-13 (×11): qty 1

## 2013-03-13 MED ORDER — TORSEMIDE 20 MG PO TABS
20.0000 mg | ORAL_TABLET | Freq: Every day | ORAL | Status: DC
  Administered 2013-03-13 – 2013-03-18 (×6): 20 mg via ORAL
  Filled 2013-03-13 (×7): qty 1

## 2013-03-13 MED ORDER — AMIODARONE HCL 100 MG PO TABS
100.0000 mg | ORAL_TABLET | Freq: Every day | ORAL | Status: DC
  Administered 2013-03-13 – 2013-03-23 (×11): 100 mg via ORAL
  Filled 2013-03-13 (×11): qty 1

## 2013-03-13 MED ORDER — PANTOPRAZOLE SODIUM 40 MG PO TBEC
40.0000 mg | DELAYED_RELEASE_TABLET | Freq: Every day | ORAL | Status: DC
  Administered 2013-03-13 – 2013-03-23 (×11): 40 mg via ORAL
  Filled 2013-03-13 (×12): qty 1

## 2013-03-13 MED ORDER — DOBUTAMINE IN D5W 4-5 MG/ML-% IV SOLN
6.0000 ug/kg/min | INTRAVENOUS | Status: DC
  Administered 2013-03-13: 2.5 ug/kg/min via INTRAVENOUS
  Administered 2013-03-15 – 2013-03-20 (×3): 5 ug/kg/min via INTRAVENOUS
  Administered 2013-03-21 (×2): 6 ug/kg/min via INTRAVENOUS
  Filled 2013-03-13 (×6): qty 250

## 2013-03-13 MED ORDER — BOOST / RESOURCE BREEZE PO LIQD
1.0000 | Freq: Two times a day (BID) | ORAL | Status: DC
  Administered 2013-03-13 – 2013-03-23 (×13): 1 via ORAL

## 2013-03-13 MED ORDER — HYDRALAZINE HCL 25 MG PO TABS
25.0000 mg | ORAL_TABLET | Freq: Three times a day (TID) | ORAL | Status: DC
  Administered 2013-03-13 – 2013-03-23 (×30): 25 mg via ORAL
  Filled 2013-03-13 (×33): qty 1

## 2013-03-13 MED ORDER — WARFARIN SODIUM 7.5 MG PO TABS
7.5000 mg | ORAL_TABLET | Freq: Once | ORAL | Status: AC
  Administered 2013-03-13: 7.5 mg via ORAL
  Filled 2013-03-13: qty 1

## 2013-03-13 MED ORDER — CITALOPRAM HYDROBROMIDE 20 MG PO TABS
20.0000 mg | ORAL_TABLET | Freq: Every day | ORAL | Status: DC
  Administered 2013-03-13 – 2013-03-23 (×11): 20 mg via ORAL
  Filled 2013-03-13 (×11): qty 1

## 2013-03-13 MED ORDER — CHLORHEXIDINE GLUCONATE 4 % EX LIQD
CUTANEOUS | Status: AC
  Filled 2013-03-13: qty 30

## 2013-03-13 MED ORDER — FE FUMARATE-B12-VIT C-FA-IFC PO CAPS
1.0000 | ORAL_CAPSULE | Freq: Three times a day (TID) | ORAL | Status: DC
  Administered 2013-03-13 – 2013-03-23 (×29): 1 via ORAL
  Filled 2013-03-13 (×35): qty 1

## 2013-03-13 MED ORDER — ZOLPIDEM TARTRATE 5 MG PO TABS
5.0000 mg | ORAL_TABLET | Freq: Every evening | ORAL | Status: DC | PRN
Start: 2013-03-13 — End: 2013-03-23

## 2013-03-13 MED ORDER — DIGOXIN 0.0625 MG HALF TABLET
0.0625 mg | ORAL_TABLET | Freq: Every day | ORAL | Status: DC
  Administered 2013-03-13 – 2013-03-23 (×11): 0.0625 mg via ORAL
  Filled 2013-03-13 (×11): qty 1

## 2013-03-13 MED ORDER — SILDENAFIL CITRATE 20 MG PO TABS
40.0000 mg | ORAL_TABLET | Freq: Three times a day (TID) | ORAL | Status: DC
  Administered 2013-03-13 – 2013-03-23 (×28): 40 mg via ORAL
  Filled 2013-03-13 (×34): qty 2

## 2013-03-13 MED ORDER — ASPIRIN EC 325 MG PO TBEC
325.0000 mg | DELAYED_RELEASE_TABLET | Freq: Every day | ORAL | Status: DC
  Administered 2013-03-13 – 2013-03-23 (×11): 325 mg via ORAL
  Filled 2013-03-13 (×11): qty 1

## 2013-03-13 MED ORDER — POTASSIUM CHLORIDE CRYS ER 10 MEQ PO TBCR
10.0000 meq | EXTENDED_RELEASE_TABLET | Freq: Every day | ORAL | Status: DC
  Administered 2013-03-13 – 2013-03-14 (×2): 10 meq via ORAL
  Filled 2013-03-13 (×3): qty 1

## 2013-03-13 MED ORDER — SILDENAFIL CITRATE 20 MG PO TABS
20.0000 mg | ORAL_TABLET | Freq: Three times a day (TID) | ORAL | Status: DC
  Administered 2013-03-13: 20 mg via ORAL
  Filled 2013-03-13 (×3): qty 1

## 2013-03-13 NOTE — Progress Notes (Addendum)
ANTICOAGULATION CONSULT NOTE   Pharmacy Consult for Lovenox>>Coumadin Indication: LVAD  No Known Allergies  Patient Measurements: Height: 5\' 3"  (160 cm) Weight: 154 lb 8.7 oz (70.1 kg) IBW/kg (Calculated) : 52.4  Vital Signs: Temp: 97.8 F (36.6 C) (11/11 0733) Temp src: Oral (11/11 0733) Pulse Rate: 92 (11/11 0600)  Labs:  Recent Labs  03/11/13 0403 03/12/13 0500 03/13/13 0345  HGB 10.3* 10.1* 10.0*  HCT 31.8* 31.6* 30.9*  PLT 273 268 308  LABPROT 24.6* 19.5* 17.8*  INR 2.31* 1.70* 1.51*  CREATININE 1.40* 1.24* 1.25*    Estimated Creatinine Clearance: 42.1 ml/min (by C-G formula based on Cr of 1.25).  Assessment: 67yof s/p LVAD implant 12/2012. She presents with possible empyema/LVAD infection.  Initially started on broad spectrum abx - now off per ID recommendations.   Pt on coumadin for anticoagulation for LVAD. INR continues to decrease this morning, possibly rifampin induced. Hgb stable. No bleeding noted. TEE/RHC insignificant. Orders received to bridge with sq lovenox until INR responds, will be more aggressive with warfarin tonight.  Home dose Coumadin 5mg  MWF, 2.5mg  TTSS  Goal of Therapy:  Heparin level goal 0.3-0.7 INR 2-3 Monitor platelets by anticoagulation protocol: Yes   Plan:  Start lovenox 70mg  sq q12 hours (likely has 60mg  inj left over at home if needed at d/c) Will give warfarin 7.5 mg tonight  Daily INR  Sheppard Coil PharmD., BCPS Clinical Pharmacist Pager [phone] 03/13/2013 7:43 AM

## 2013-03-13 NOTE — Progress Notes (Addendum)
NUTRITION FOLLOW UP  Intervention:     Resource Breeze twice daily (250 kcals, 9 gm protein per 8 fl oz carton) RD to follow for nutrition care plan  New Nutrition Dx:   Increased nutrient needs related to acute illness, infection as evidenced by estimated nutrition needs, ongoing  Goal:   Pt to meet >/= 90% of their estimated nutrition needs, progressing  Monitor:   PO & supplemental intake, weight, labs, I/O's  Assessment:   Patient with PMH of severe HTN and CHF; s/p HM II LVAD implantation 12/18/12; admitted 10/12--10/14 for fatigue and concern for low output due to RHF; admitted for possible empyema / LVAD infection.   Patient s/p procedure 11/6:  IR US GUIDE VASC ACCESS RIGHT   Patient s/p procedure 11/10: CARDIAC CATHETERIZATION  Patient s/p exchange of PICC line from single to double lumen today.  Reports her appetite and nausea are better.  PO intake 50% per flowsheet records.  Resource Breeze supplements in place daily -- will increase frequency.    Height: Ht Readings from Last 1 Encounters:  03/10/13 5\' 3"  (1.6 m)    Weight Status:   Wt Readings from Last 1 Encounters:  03/13/13 154 lb 8.7 oz (70.1 kg)    Re-estimated needs:  Kcal: 1700-1900 Protein: 90-100 gm Fluid: 1.7-1.9 L  Skin: abdominal surgical incision   Diet Order: Cardiac   Intake/Output Summary (Last 24 hours) at 03/13/13 1052 Last data filed at 03/13/13 1000  Gross per 24 hour  Intake  443.2 ml  Output    750 ml  Net -306.8 ml    Labs:   Recent Labs Lab 03/11/13 0403 03/12/13 0500 03/13/13 0345  NA 133* 132* 133*  K 4.7 4.6 4.4  CL 99 100 100  CO2 22 23 21   BUN 22 20 18   CREATININE 1.40* 1.24* 1.25*  CALCIUM 9.9 10.1 10.0  GLUCOSE 100* 90 88    Scheduled Meds: . amiodarone  100 mg Oral Daily  . amLODipine  10 mg Oral Daily  . aspirin EC  325 mg Oral Daily  . citalopram  20 mg Oral Daily  . digoxin  0.0625 mg Oral Daily  . enoxaparin (LOVENOX) injection  1 mg/kg  Subcutaneous Q12H  . feeding supplement (RESOURCE BREEZE)  1 Container Oral Q1500  . ferrous fumarate-b12-vitamic C-folic acid  1 capsule Oral TID PC  . hydrALAZINE  25 mg Oral Q8H  . pantoprazole  40 mg Oral Daily  . potassium chloride  10 mEq Oral Daily  . sildenafil  20 mg Oral TID  . simvastatin  10 mg Oral QHS  . sodium chloride  10-40 mL Intracatheter Q12H  . sodium chloride  3 mL Intravenous Q12H  . spironolactone  25 mg Oral Daily  . torsemide  20 mg Oral Daily  . warfarin  7.5 mg Oral ONCE-1800  . Warfarin - Pharmacist Dosing Inpatient   Does not apply q1800    Continuous Infusions: . sodium chloride 15 mL/hr at 03/12/13 2200  . milrinone 0.25 mcg/kg/min (03/13/13 1610)    Maureen Chatters, RD, LDN Pager #: [phone] After-Hours Pager #: [phone]

## 2013-03-13 NOTE — Consult Note (Signed)
Patient Kaitlin [name]      DOB: 1946-01-21      NWG:[id number]     Consult Note from the Palliative Medicine Team at [facility]    Consult Requested by: Ulla Potash NP     PCP: Evlyn Courier, MD Reason for Consultation: F/U   LVAD Education and counseling    Phone Number:[phone]     Brief HPI:Ms. 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 EF 15-25% with mild to moderate RV dysfunction  8/18 Underwent HM II LVAD implant (under destination criteria) along with TV repair. Post-op couse c/b RHF requiring take back to the OR for bleeding and temproray RVAD support.  Admitted 10/12-10/14/14 for fatigue. Concern was for low output due to RHF. Echo with severe RV dysfunction and severe TR Has been struggling with persistent leukocytosis and weakness.  Followed  in Clinic.  Admitted on 03-08-13 for increased SOB.  Now on milrinone for RV dysfunction.  Considering home milrinone   ROS:  weakness and SOB on increased activity          -denies depressed mood, sadness   PMH:  Past Medical History  Diagnosis Date  . Paroxysmal supraventricular tachycardia   . Hypokalemia   . Chronic systolic heart failure     a. 06/8411 Echo: EF 15%, mod to sev antlat and inflat HK, inf AK, mild to mod MR, mildly/mod reduced RV fxn, Mod TR, PASP .  [name] History of DVT (deep vein thrombosis)   . Dyslipidemia   . HTN (hypertension)   . Nonischemic cardiomyopathy     a. 06/2012 Echo: EF 15%;  b. 06/2012 Cath: nl except luminal irregs in LAD.  [name] Ventricular tachycardia     a. s/p ICD  . Goiter 2001  . Implantable cardiac defibrillator- Biotronik 2009    Single chamber  . [name] (left bundle branch block)     intermittent  . Palliative care patient      PSH: Past Surgical History  Procedure Laterality Date  . None    . Cardiac defibrillator placement  01/2009  . Insertion of implantable left ventricular assist device N/A  12/18/2012    Procedure: INSERTION OF IMPLANTABLE LEFT VENTRICULAR ASSIST DEVICE;  Surgeon: Kerin Perna, MD;  Location: [facility] OR;  Service: Open Heart Surgery;  Laterality: N/A;  . Intraoperative transesophageal echocardiogram N/A 12/18/2012    Procedure: INTRAOPERATIVE TRANSESOPHAGEAL ECHOCARDIOGRAM;  Surgeon: Kerin Perna, MD;  Location: [facility] OR;  Service: Open Heart Surgery;  Laterality: N/A;  . Tricuspid valve replacement N/A 12/18/2012    Procedure: TRICUSPID VALVE REPAIR;  Surgeon: Kerin Perna, MD;  Location: [facility] OR;  Service: Open Heart Surgery;  Laterality: N/A;  . Insertion of implantable left ventricular assist device N/A 12/18/2012    Procedure: INSERTION OF IMPLANTABLE RIGHT VENTRICULAR ASSIST DEVICE;  Surgeon: Kerin Perna, MD;  Location: [facility] OR;  Service: Open Heart Surgery;  Laterality: N/A;  . Removal of centrimag ventricular assist device N/A 12/25/2012    Procedure: REMOVAL OF CENTRIMAG VENTRICULAR ASSIST DEVICE;  Surgeon: Kerin Perna, MD;  Location: [facility] OR;  Service: Open Heart Surgery;  Laterality: N/A;  PUMP STANDBY  . Intraoperative transesophageal echocardiogram N/A 12/25/2012    Procedure: INTRAOPERATIVE TRANSESOPHAGEAL ECHOCARDIOGRAM;  Surgeon: Kerin Perna, MD;  Location: [facility] OR;  Service: Open Heart Surgery;  Laterality: N/A;  . Esophagogastroduodenoscopy N/A 12/26/2012    Procedure: ESOPHAGOGASTRODUODENOSCOPY (EGD);  Surgeon:  Beverley Fiedler, MD;  Location: [facility] ENDOSCOPY;  Service: Gastroenterology;  Laterality: N/A;  Bedside  . Sternal closure N/A 12/27/2012    Procedure: STERNAL CLOSURE;  Surgeon: Kerin Perna, MD;  Location: [facility] OR;  Service: Thoracic;  Laterality: N/A;  . Mediastinal exploration N/A 12/27/2012    Procedure: MEDIASTINAL EXPLORATION;  Surgeon: Kerin Perna, MD;  Location: [facility] OR;  Service: Thoracic;  Laterality: N/A;   I have reviewed the FH and SH and  If appropriate update it with new information. No Known Allergies Scheduled Meds: .  amiodarone  100 mg Oral Daily  . amLODipine  10 mg Oral Daily  . aspirin EC  325 mg Oral Daily  . citalopram  20 mg Oral Daily  . digoxin  0.0625 mg Oral Daily  . enoxaparin (LOVENOX) injection  1 mg/kg Subcutaneous Q12H  . feeding supplement (RESOURCE BREEZE)  1 Container Oral Q1500  . ferrous fumarate-b12-vitamic C-folic acid  1 capsule Oral TID PC  . hydrALAZINE  25 mg Oral Q8H  . pantoprazole  40 mg Oral Daily  . potassium chloride  10 mEq Oral Daily  . sildenafil  20 mg Oral TID  . simvastatin  10 mg Oral QHS  . sodium chloride  10-40 mL Intracatheter Q12H  . sodium chloride  3 mL Intravenous Q12H  . spironolactone  25 mg Oral Daily  . torsemide  20 mg Oral Daily  . warfarin  7.5 mg Oral ONCE-1800  . Warfarin - Pharmacist Dosing Inpatient   Does not apply q1800   Continuous Infusions: . sodium chloride 15 mL/hr at 03/12/13 2200  . milrinone 0.25 mcg/kg/min (03/13/13 0635)   PRN Meds:.sodium chloride, acetaminophen, ALPRAZolam, ondansetron (ZOFRAN) IV, sodium chloride, sodium chloride, zolpidem    Pulse 95  Temp(Src) 97.8 F (36.6 C) (Oral)  Resp 30  Ht 5\' 3"  (1.6 m)  Wt 70.1 kg (154 lb 8.7 oz)  BMI 27.38 kg/m2  SpO2 95%   PPS: 40 %   Intake/Output Summary (Last 24 hours) at 03/13/13 1049 Last data filed at 03/13/13 1000  Gross per 24 hour  Intake  443.2 ml  Output    750 ml  Net -306.8 ml    Physical Exam:  General:  Well appearing, NAD HEENT:  Mm, no exudate Chest:   CTA CVS: Mechanical heart sounds with LVAD hum noted Abdomen:soft NT +BS/ LVAD site dressing D and I,  Ext:  BLE with trace edeam Neuro: moves all four extremities equally, strength 4/5   Psych/MSE:  Alert and oriented X3 Appearance: Neatly groomed Behavior: calm, cooperative, makes direct eye contact Speech: normal rate, volume and tome Mood: speaks to hope for the future, "It has been hard but I'm grateful and hopeful for a heart transplant and further improvement" Affect: congruent  with hospital sitaution Thought process: liner, clear and gaol directed Thought content: denies SI Insight/Judgement: full insight   Labs: CBC    Component Value Date/Time   WBC 9.1 03/13/2013 0345   RBC 3.55* 03/13/2013 0345   HGB 10.0* 03/13/2013 0345   HCT 30.9* 03/13/2013 0345   PLT 308 03/13/2013 0345   MCV 87.0 03/13/2013 0345   MCH 28.2 03/13/2013 0345   MCHC 32.4 03/13/2013 0345   RDW 16.8* 03/13/2013 0345   LYMPHSABS 1.1 03/13/2013 0345   MONOABS 1.0 03/13/2013 0345   EOSABS 0.2 03/13/2013 0345   BASOSABS 0.0 03/13/2013 0345    BMET    Component Value Date/Time   NA 133* 03/13/2013 0345  K 4.4 03/13/2013 0345   CL 100 03/13/2013 0345   CO2 21 03/13/2013 0345   GLUCOSE 88 03/13/2013 0345   BUN 18 03/13/2013 0345   CREATININE 1.25* 03/13/2013 0345   CALCIUM 10.0 03/13/2013 0345   GFRNONAA 44* 03/13/2013 0345   GFRAA 51* 03/13/2013 0345    CMP     Component Value Date/Time   NA 133* 03/13/2013 0345   K 4.4 03/13/2013 0345   CL 100 03/13/2013 0345   CO2 21 03/13/2013 0345   GLUCOSE 88 03/13/2013 0345   BUN 18 03/13/2013 0345   CREATININE 1.25* 03/13/2013 0345   CALCIUM 10.0 03/13/2013 0345   PROT 7.0 03/09/2013 1000   ALBUMIN 4.0 03/09/2013 1000   AST 20 03/09/2013 1000   ALT 19 03/09/2013 1000   ALKPHOS 86 03/09/2013 1000   BILITOT 1.5* 03/09/2013 1000   GFRNONAA 44* 03/13/2013 0345   GFRAA 51* 03/13/2013 0345    Goals of Care/Assessment and Plan:  Patient clearly states her goals of continued improvement and hopeful  For possibility of heart transplant in the future.  Depression/Adjustment to illness/Recommendations:  1.  Continue Celexa 20 mg       Contine Xanax 0.5 mg tid  Detailed discussion regarding positive coping mechanisms.  She tells me she loves to cook and is looking forward to making home made vegetable soup when she gets home.  She utilizes pray routinely in her day.   Reported strong community church support, but she  declines  hospital chaplain suppport at this time.  Patient verbalizes her gratitude to her daughters, especially Antoinette who "does so much for me".  She is hopeful for dc home soon.  We discussed the emotional stress of critical/chonic illness and the role of OP psych support.  She doesn't feel the need "for that" at this time.  She welcomes my visits while hospitalized    Time In Time Out Total Time Spent with Patient Total Overall Time  1000 1050 50 min 50 min    Greater than 50%  of this time was spent counseling and coordinating care related to the above assessment and plan.  Lorinda Creed NP  Palliative Medicine Team Team Phone # [phone] Pager [phone]  Discussed with Hessie Diener RN, VAD Coordinator

## 2013-03-13 NOTE — Procedures (Signed)
Successful exchange of (R)UE PICC line from single to double lumen. Length 39cm, tip at lower SVC/RA No complications.  Brayton El PA-C Interventional Radiology 03/13/2013 12:26 PM

## 2013-03-13 NOTE — Progress Notes (Signed)
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 Post-op couse c/[name] RHF requiring take back to the OR for bleeding and temproray RVAD support.   CT C/A/P showed persistent air around around driveline as well as small loculated R pleural effusion ? possible empyema. PICC placed and started on abx vanc/ceftaz and rifampin.   Admitted 11/6 with nausea. Echo 03/09/13 Unable to see valves well. RV markedly dilated and severely hypokinetic. Septal flattening c/w RV pressure volume overload.   Yesterday TEE showed significant RV dysfunction. no vegetation on valves or ICD wires. Went for RHC yesterday d/t worsening RHF. Started on milrinone 0.25. Fatigued. Denies SOB, orthopnea or CP. PICC changed today,   VAD interrogation  RPM 8600 Flow 5.5L Power 5.0 PI  2  PI events    Objective:    Vital Signs:   Temp:  [97.8 F (36.6 C)-98.6 F (37 C)] 97.8 F (36.6 C) (11/11 0733) Pulse Rate:  [52-94] 92 (11/11 0600) Resp:  [15-43] 32 (11/11 0600) SpO2:  [87 %-98 %] 97 % (11/11 0600) Weight:  [154 lb 8.7 oz (70.1 kg)] 154 lb 8.7 oz (70.1 kg) (11/11 0500) Last BM Date: 03/11/13 Filed Weights   03/11/13 0600 03/12/13 0600 03/13/13 0500  Weight: 151 lb 10.8 oz (68.8 kg) 151 lb 14.4 oz (68.9 kg) 154 lb 8.7 oz (70.1 kg)    Physical Exam: GENERAL: Well appearing,no acute distress; sitting in chair HEENT: normal  NECK: Supple, JVP 10 . 2+ bilaterally, no bruits. No lymphadenopathy or thyromegaly appreciated.  CARDIAC: Mechanical heart sounds with LVAD hum present.  LUNGS: Clear to auscultation bilaterally.  ABDOMEN: Soft, round, nontender, positive bowel sounds x4.  LVAD exit site: well-healed and incorporated. Dressing dry and intact. No erythema  or drainage. Stabilization device present and accurately applied. No tenderness to palpation at all along driveline tract.  EXTREMITIES:  no cyanosis, clubbing, rash. 1+ edema; RUE 2 PICC NEUROLOGIC: Alert and oriented x 4. Gait steady. No aphasia. No dysarthria. Affect pleasant.     Labs: Basic Metabolic Panel:  Recent Labs Lab 03/08/13 2155 03/09/13 1000 03/11/13 0403 03/12/13 0500 03/13/13 0345  NA 136 135 133* 132* 133*  K 5.0 3.8 4.7 4.6 4.4  CL 99 98 99 100 100  CO2 20 25 22 23 21   GLUCOSE 157* 88 100* 90 88  BUN 28* 23 22 20 18   CREATININE 2.31* 1.66* 1.40* 1.24* 1.25*  CALCIUM 10.3 9.5 9.9 10.1 10.0    Liver Function Tests:  Recent Labs Lab 03/08/13 2155 03/09/13 1000  AST 22 20  ALT 22 19  ALKPHOS 104 86  BILITOT 0.8 1.5*  PROT 7.8 7.0  ALBUMIN 4.6 4.0   No results found for this basename: LIPASE, AMYLASE,  in the last 168 hours No results found for this basename: AMMONIA,  in the last 168 hours  CBC:  Recent Labs Lab 03/07/13 1307 03/08/13 2155 03/09/13 1000 03/11/13 0403 03/12/13 0500 03/13/13 0345  WBC 14.0* 12.4* 9.7 9.5 9.2 9.1  NEUTROABS 11.6* 10.1*  --  7.7 7.1 6.8  HGB 11.3* 10.5* 9.5* 10.3* 10.1* 10.0*  HCT 34.4* 32.7* 29.4* 31.8* 31.6* 30.9*  MCV 87.3 87.4 87.2 86.6 87.3 87.0  PLT  310 303 257 273 268 308    Cardiac Enzymes: No results found for this basename: CKTOTAL, CKMB, CKMBINDEX, TROPONINI,  in the last 168 hours  BNP: BNP (last 3 results)  Recent Labs  02/28/13 1423 03/07/13 1307 03/08/13 2155  PROBNP 2290.0* 1875.0* 2459.0*    CBG: No results found for this basename: GLUCAP,  in the last 168 hours  Coagulation Studies:  Recent Labs  03/11/13 0403 03/12/13 0500 03/13/13 0345  LABPROT 24.6* 19.5* 17.8*  INR 2.31* 1.70* 1.51*    Other results: EKG: SR with Mobitz Type II HB.  Imaging: No results found.      Assessment:   1. Nausea 2. Dypnea 3. Chronic biventricular HF Status post HM-II LVAD  implantation.  4. PAF  5. Recent VT s/p ICD shock on amio 6. R pleural effusion - ? Empyema 7. ? driveline infection 8. Leukocytosis 9. Transient CHB 10. Chronic renal failure, stage III 11. Recurrent RHF, on milrinone  Plan/Discussion:    Stable overnight. Went for RHC yesterday and started on milrinone 0.25 for RHF. Co-ox this am 54%. Will restart sildenafil at decreased dose 20 mg TID and recheck co-ox this afternoon. If it remains low will increase milrinone to 0.375. Weight up 3 lbs will start back diuretics, torsemide 20 mg daily.   INR is 1.5 will start lovenox for bridge, pharmacy dosing.  MAPs slightly elevated, but all meds discontinued after cath. Will start back norvasc 10 mg daily, spironolactone 25 mg daily, and hydralazine 25 mg TID.  Start back ASA 325 mg, celexa 20 mg daily, digoxin 0.0625 mg, amiodarone 100 mg daily, protonix 40 mg daily, simvastatin 10 mg daily, ambien, Trinsicon and KDUR 10 meq daily.   Will ask IV team to exchange PICC for 2L d/t frequent blood draws and home milrinone.   Continue to work CR.   Kaitlin Potash B,NP-C 7:39 AM  Patient seen and examined with Kaitlin Potash, NP. We discussed all aspects of the encounter. I agree with the assessment and plan as stated above. VAD interrogated personally.   Reviewed TEE and cath results. She feels OK. Renal function back to baseline however co-ox not changed much. Previously did not respond well to milrinone will switch to dobutamine. Will need home inotropes. We have stopped abx. All cultures remain negative.   Kaitlin Roedel,MD 1:36 PM

## 2013-03-13 NOTE — Progress Notes (Signed)
1 Day Post-Op Procedure(s) (LRB): RIGHT HEART CATH (N/A) Subjective: Stable might after starting low-dose milrinone for RV dysfunction Minimal PI events, maintaining sinus rhythm Chest x-ray with mild congestion  Objective: Vital signs in last 24 hours: Temp:  [97.8 F (36.6 C)-98.6 F (37 C)] 97.8 F (36.6 C) (11/11 0733) Pulse Rate:  [52-94] 92 (11/11 0600) Cardiac Rhythm:  [-] Normal sinus rhythm (11/11 0400) Resp:  [15-43] 32 (11/11 0600) SpO2:  [87 %-98 %] 97 % (11/11 0600) Weight:  [154 lb 8.7 oz (70.1 kg)] 154 lb 8.7 oz (70.1 kg) (11/11 0500)  Hemodynamic parameters for last 24 hours:   stable afebrile, mean arterial pressure slightly higher  Intake/Output from previous day: 11/10 0701 - 11/11 0700 In: 422.4 [P.O.:180; I.V.:242.4] Out: 750 [Urine:750] Intake/Output this shift:    Exam  Sitting in chair alert and comfortable Lungs clear No murmur Extremities warm  Lab Results:  Recent Labs  03/12/13 0500 03/13/13 0345  WBC 9.2 9.1  HGB 10.1* 10.0*  HCT 31.6* 30.9*  PLT 268 308   BMET:  Recent Labs  03/12/13 0500 03/13/13 0345  NA 132* 133*  K 4.6 4.4  CL 100 100  CO2 23 21  GLUCOSE 90 88  BUN 20 18  CREATININE 1.24* 1.25*  CALCIUM 10.1 10.0    PT/INR:  Recent Labs  03/13/13 0345  LABPROT 17.8*  INR 1.51*   ABG    Component Value Date/Time   PHART 7.406 02/12/2013 0842   HCO3 21.2 03/12/2013 1401   HCO3 18.6* 03/12/2013 1401   TCO2 22 03/12/2013 1401   TCO2 20 03/12/2013 1401   ACIDBASEDEF 4.0* 03/12/2013 1401   ACIDBASEDEF 6.0* 03/12/2013 1401   O2SAT 54.6 03/13/2013 0345   CBG (last 3)  No results found for this basename: GLUCAP,  in the last 72 hours  Assessment/Plan: S/P Procedure(s) (LRB): RIGHT HEART CATH (N/A) INR is subtherapeutic-we'll start Lovenox bridge to therapeutic INR Resume by mouth sildenafil for RV support Recheck central venous O2 sat later today Leave in ICU today  LOS: 5 days    Kaitlin [name]  III,Kaitlin [name] 03/13/2013

## 2013-03-13 NOTE — Progress Notes (Signed)
Chaplain offered emotional and spiritual support to pt, Kaitlin [name], but she does not want chaplain support at this time.   Maurene Capes [phone]

## 2013-03-14 ENCOUNTER — Encounter (HOSPITAL_COMMUNITY): Payer: Commercial Managed Care - PPO

## 2013-03-14 LAB — CBC WITH DIFFERENTIAL/PLATELET
Basophils Absolute: 0 10*3/uL (ref 0.0–0.1)
Basophils Relative: 0 % (ref 0–1)
Eosinophils Relative: 1 % (ref 0–5)
HCT: 29 % — ABNORMAL LOW (ref 36.0–46.0)
Lymphocytes Relative: 14 % (ref 12–46)
MCH: 27.9 pg (ref 26.0–34.0)
MCHC: 32.4 g/dL (ref 30.0–36.0)
MCV: 86.1 fL (ref 78.0–100.0)
Monocytes Absolute: 0.8 10*3/uL (ref 0.1–1.0)
Neutrophils Relative %: 76 % (ref 43–77)
RDW: 17.2 % — ABNORMAL HIGH (ref 11.5–15.5)

## 2013-03-14 LAB — BASIC METABOLIC PANEL
BUN: 23 mg/dL (ref 6–23)
CO2: 22 mEq/L (ref 19–32)
Chloride: 100 mEq/L (ref 96–112)
Creatinine, Ser: 1.46 mg/dL — ABNORMAL HIGH (ref 0.50–1.10)
Glucose, Bld: 98 mg/dL (ref 70–99)

## 2013-03-14 LAB — CARBOXYHEMOGLOBIN
Carboxyhemoglobin: 1.1 % (ref 0.5–1.5)
Methemoglobin: 0.8 % (ref 0.0–1.5)
Methemoglobin: 1.1 % (ref 0.0–1.5)
O2 Saturation: 61.6 %

## 2013-03-14 MED ORDER — WARFARIN SODIUM 5 MG PO TABS
5.0000 mg | ORAL_TABLET | Freq: Once | ORAL | Status: AC
  Administered 2013-03-14: 5 mg via ORAL
  Filled 2013-03-14: qty 1

## 2013-03-14 MED ORDER — FUROSEMIDE 10 MG/ML IJ SOLN
40.0000 mg | Freq: Once | INTRAMUSCULAR | Status: AC
  Administered 2013-03-14: 40 mg via INTRAVENOUS
  Filled 2013-03-14: qty 4

## 2013-03-14 MED ORDER — METOCLOPRAMIDE HCL 5 MG/ML IJ SOLN
5.0000 mg | Freq: Three times a day (TID) | INTRAMUSCULAR | Status: DC
  Administered 2013-03-14 – 2013-03-19 (×14): 5 mg via INTRAVENOUS
  Filled 2013-03-14 (×18): qty 1

## 2013-03-14 MED ORDER — ALPRAZOLAM 0.5 MG PO TABS
0.5000 mg | ORAL_TABLET | Freq: Three times a day (TID) | ORAL | Status: DC
  Administered 2013-03-14 – 2013-03-16 (×7): 0.5 mg via ORAL
  Filled 2013-03-14 (×6): qty 1

## 2013-03-14 NOTE — Progress Notes (Signed)
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 Post-op couse c/[name] RHF requiring take back to the OR for bleeding and temproray RVAD support.   CT C/A/P showed persistent air around around driveline as well as small loculated R pleural effusion ? possible empyema. PICC placed and started on abx vanc/ceftaz and rifampin.   Admitted 11/6 with nausea. Echo 03/09/13 Unable to see valves well. RV markedly dilated and severely hypokinetic. Septal flattening c/w RV pressure volume overload.   TEE 03/12/13 showed significant RV dysfunction. no vegetation on valves or ICD wires. RHC 03/12/13 d/t worsening RHF. Started on milrinone 0.25.   Changed to dobutamine gtt yesterday and single lumen PICC exchanged for 2L. Co-ox 61%. -457 cc. Cr starting to trend back up 1.46 this am. Complaining of SOB and abdominal discomfort around her rib cage with no appetite. Very anxious.  VAD interrogation  RPM 8600 Flow 5.0L Power 6.6 PI 4.8  3 PI events    Objective:    Vital Signs:   Temp:  [97.6 F (36.4 C)-98.1 F (36.7 C)] 97.9 F (36.6 C) (11/12 0758) Pulse Rate:  [78-99] 84 (11/12 0800) Resp:  [13-44] 18 (11/12 0800) SpO2:  [91 %-100 %] 94 % (11/12 0800) Weight:  [154 lb 5.2 oz (70 kg)] 154 lb 5.2 oz (70 kg) (11/12 0500) Last BM Date: 03/13/13 Filed Weights   03/12/13 0600 03/13/13 0500 03/14/13 0500  Weight: 151 lb 14.4 oz (68.9 kg) 154 lb 8.7 oz (70.1 kg) 154 lb 5.2 oz (70 kg)    Physical Exam: GENERAL: Well appearing,no acute distress; sitting in chair HEENT: normal  NECK: Supple, JVP 10 . 2+ bilaterally, no bruits. No lymphadenopathy or thyromegaly appreciated.  CARDIAC: Mechanical heart sounds with LVAD hum present.  LUNGS: Clear to  auscultation bilaterally.  ABDOMEN: Soft, round, tender around rib cage, positive bowel sounds x4.  LVAD exit site: well-healed and incorporated. Dressing dry and intact. No erythema or drainage. Stabilization device present and accurately applied. No tenderness to palpation at all along driveline tract.  EXTREMITIES:  no cyanosis, clubbing, rash. 1+ edema; RUE 2 PICC NEUROLOGIC: Alert and oriented x 4. Gait steady. No aphasia. No dysarthria. Affect pleasant.     Labs: Basic Metabolic Panel:  Recent Labs Lab 03/09/13 1000 03/11/13 0403 03/12/13 0500 03/13/13 0345 03/14/13 0415  NA 135 133* 132* 133* 134*  K 3.8 4.7 4.6 4.4 4.2  CL 98 99 100 100 100  CO2 25 22 23 21 22   GLUCOSE 88 100* 90 88 98  BUN 23 22 20 18 23   CREATININE 1.66* 1.40* 1.24* 1.25* 1.46*  CALCIUM 9.5 9.9 10.1 10.0 9.5    Liver Function Tests:  Recent Labs Lab 03/08/13 2155 03/09/13 1000  AST 22 20  ALT 22 19  ALKPHOS 104 86  BILITOT 0.8 1.5*  PROT 7.8 7.0  ALBUMIN 4.6 4.0   No results found for this basename: LIPASE, AMYLASE,  in the last 168 hours No results found for this basename: AMMONIA,  in the last 168 hours  CBC:  Recent Labs Lab 03/08/13 2155 03/09/13 1000 03/11/13 0403 03/12/13 0500 03/13/13 0345 03/14/13 0415  WBC 12.4* 9.7 9.5 9.2 9.1 8.2  NEUTROABS 10.1*  --  7.7 7.1 6.8 6.2  HGB 10.5* 9.5* 10.3* 10.1* 10.0* 9.4*  HCT 32.7* 29.4* 31.8* 31.6* 30.9* 29.0*  MCV 87.4 87.2 86.6 87.3 87.0 86.1  PLT 303 257 273 268 308 260    Cardiac Enzymes: No results found for this basename: CKTOTAL, CKMB, CKMBINDEX, TROPONINI,  in the last 168 hours  BNP: BNP (last 3 results)  Recent Labs  02/28/13 1423 03/07/13 1307 03/08/13 2155  PROBNP 2290.0* 1875.0* 2459.0*    CBG: No results found for this basename: GLUCAP,  in the last 168 hours  Coagulation Studies:  Recent Labs  03/12/13 0500 03/13/13 0345 03/14/13 0415  LABPROT 19.5* 17.8* 20.9*  INR 1.70* 1.51* 1.86*     Other results: EKG: SR with Mobitz Type II HB.  Imaging: Ir Fluoro Guide Cv Line Right  03/13/2013   CLINICAL DATA:  Congestive heart failure. Needs dual-lumen IV access for medications. Recently placed single lumen PICC line request to be exchanged for a dual-lumen  EXAM: IR RIGHT FLOURO GUIDE CV LINE  TECHNIQUE: The right arm was prepped with chlorhexidine, draped in the usual sterile fashion using maximum barrier technique (cap and mask, sterile gown, sterile gloves, large sterile sheet, hand hygiene and cutaneous antiseptic). Local anesthesia was attained by infiltration with 1% lidocaine.  The existing PICC line, measuring 39cm, was cut and removed over wire. A new peel-away sheath was placed over the wire, through which a 39 cm 5 [place] dual lumen power injectable PICC was advanced, and positioned with its tip at the lower SVC/right atrial junction. Fluoroscopy during the procedure and fluoro spot radiograph confirms appropriate catheter position. The catheter was flushed, secured to the skin with Prolene sutures, and covered with a sterile dressing.  FLUOROSCOPY TIME:  6 seconds  COMPLICATIONS: None.  The patient tolerated the procedure well.  IMPRESSION: Successful exchange of a right arm PICC with fluoroscopic guidance. The new dual lumen catheter is ready for use.  Read by: Brayton El PA-C   Electronically Signed   By: Irish Lack M.D.   On: 03/13/2013 14:46   Dg Chest Port 1 View  03/13/2013   CLINICAL DATA:  Heart failure and prior placement of left ventricular assist device.  EXAM: PORTABLE CHEST - 1 VIEW  COMPARISON:  05/08/2012  FINDINGS: Cardiac defibrillator and LVAD show stable positioning. A PICC line is also present on the right extending into the lower SVC. Lungs show slight worsening of atelectasis in the left mid to lower lung zone. Stable scarring/atelectasis present in the right lung. No evidence of overt pulmonary edema or significant pleural fluid.  IMPRESSION:  Worsening of atelectasis in the left lung.   Electronically Signed   By: Irish Lack M.D.   On: 03/13/2013 08:27        Assessment:   1. Nausea 2. Dypnea 3. Chronic biventricular HF Status post HM-II LVAD implantation.  4. PAF  5. Recent VT s/p ICD shock on amio 6. R pleural effusion - ? Empyema 7. ? driveline infection 8. Leukocytosis 9. Transient CHB 10. Chronic renal failure, stage III 11. Recurrent RHF, on milrinone  Plan/Discussion:    Switched to dobutamine 2.5 last night and co-ox remains 61%. She complains of more SOB this am and volume status remains elevated. Will give one dose of IV 40 mg today and continue to watch her Cr, which is trending back up. Increase dobutamine to and check co-ox at 12 pm.   Difficult situation she is complaining of abdominal discomfort along ribcage  where driveline is tunneled, however WBC normal, cultures negative and CT unremarkable. Will order gastric emptying study and start reglan 10 mg PO TID.  INR is 1.86 will continue lovenox as bridge. pharmacy dosing.  MAPs more controlled with restarting medications, will continue to follow.    Continue to work CR.   Kaitlin Potash [name],Kaitlin [name] 8:46 AM  Patient seen and examined with Kaitlin Potash, NP. We discussed all aspects of the encounter. I agree with the assessment and plan as stated above.   Continues to struggle with upper ab discomfort, fatigue and RH failure.  Will increase dobutamine. Continue sildenafil. Will check gastric emptying study.   D/w Dr. Laneta Simmers.   VAD parameters look ok.  Kaitlin Bensimhon,MD 12:52 PM

## 2013-03-14 NOTE — Progress Notes (Signed)
ANTICOAGULATION CONSULT NOTE   Pharmacy Consult for Lovenox>>Coumadin Indication: LVAD  No Known Allergies  Patient Measurements: Height: 5\' 3"  (160 cm) Weight: 154 lb 5.2 oz (70 kg) IBW/kg (Calculated) : 52.4  Vital Signs: Temp: 97.9 F (36.6 C) (11/12 0758) Temp src: Oral (11/12 0758) Pulse Rate: 84 (11/12 0800)  Labs:  Recent Labs  03/12/13 0500 03/13/13 0345 03/14/13 0415  HGB 10.1* 10.0* 9.4*  HCT 31.6* 30.9* 29.0*  PLT 268 308 260  LABPROT 19.5* 17.8* 20.9*  INR 1.70* 1.51* 1.86*  CREATININE 1.24* 1.25* 1.46*    Estimated Creatinine Clearance: 36 ml/min (by C-G formula based on Cr of 1.46).  Assessment: 67yof s/p LVAD implant 12/2012. She presents with possible empyema/LVAD infection.  Initially started on broad spectrum abx - now off per ID recommendations.   Pt on coumadin for anticoagulation for LVAD. INR up this morning to 1.8. Hgb stable overall. No bleeding noted. TEE insignificant for vegetations. Will continue lovenox bridge until INR>2.   Home dose Coumadin 5mg  MWF, 2.5mg  TTSS  Goal of Therapy:  INR 2-3 Monitor platelets by anticoagulation protocol: Yes   Plan:  Start lovenox 70mg  sq q12 hours (likely has 60mg  inj left over at home if needed at d/c) Will give warfarin 5 mg tonight  Daily INR  Sheppard Coil PharmD., BCPS Clinical Pharmacist Pager [phone] 03/14/2013 8:51 AM

## 2013-03-14 NOTE — Progress Notes (Signed)
Clinical Social Work Department BRIEF PSYCHOSOCIAL ASSESSMENT 03/14/2013  Patient:  Kaitlin [name], Kaitlin [name]     Account Number:  [id number]     Admit date:  03/08/2013  Clinical Social Worker:  Kaitlin [name]  Date/Time:  03/14/2013 01:04 PM  Referred by:  Care Management  Date Referred:  03/14/2013 Referred for  Other - See comment   Other Referral:   Disability   Interview type:  Patient Other interview type:    PSYCHOSOCIAL DATA Living Status:   Admitted from facility:   Level of care:   Primary support name:  Kaitlin [name] Primary support relationship to patient:  CHILD, ADULT Degree of support available:   Good    CURRENT CONCERNS Current Concerns  Financial Resources   Other Concerns:    SOCIAL WORK ASSESSMENT / PLAN CSW received consult that patient needs assistance in disability. CSW went into room to speak to patient. Patient stated she wanted her daughter to talk to the social worker about disability and anything other needs. CSW attempted to call daughter to speak to her, csw left message and is awaiting phone call. CSW spoke to financial counseling, who stated that the financial counselor and patient's daughter have scheduled a meeting for Monday. CSW will attempt to call daughter tomorrow.   Assessment/plan status:  No Further Intervention Required Other assessment/ plan:   Information/referral to community resources:   Artist    PATIENT'S/FAMILY'S RESPONSE TO PLAN OF CARE: Patient asked CSW to speak to patient's daughter. Patient thanked CSW for the visit.       Kaitlin [name], MSW, Kaitlin [name] [phone]

## 2013-03-15 ENCOUNTER — Inpatient Hospital Stay (HOSPITAL_COMMUNITY): Payer: Medicare Other

## 2013-03-15 LAB — BASIC METABOLIC PANEL
Calcium: 9.4 mg/dL (ref 8.4–10.5)
Creatinine, Ser: 1.35 mg/dL — ABNORMAL HIGH (ref 0.50–1.10)
GFR calc non Af Amer: 40 mL/min — ABNORMAL LOW (ref >90)
Glucose, Bld: 103 mg/dL — ABNORMAL HIGH (ref 70–99)
Sodium: 136 mEq/L (ref 135–145)

## 2013-03-15 LAB — CBC WITH DIFFERENTIAL/PLATELET
Basophils Absolute: 0 10*3/uL (ref 0.0–0.1)
Eosinophils Absolute: 0.1 10*3/uL (ref 0.0–0.7)
Hemoglobin: 9.3 g/dL — ABNORMAL LOW (ref 12.0–15.0)
Lymphocytes Relative: 10 % — ABNORMAL LOW (ref 12–46)
Lymphs Abs: 0.9 10*3/uL (ref 0.7–4.0)
MCH: 27.8 pg (ref 26.0–34.0)
Neutrophils Relative %: 79 % — ABNORMAL HIGH (ref 43–77)
Platelets: 273 10*3/uL (ref 150–400)
RBC: 3.34 MIL/uL — ABNORMAL LOW (ref 3.87–5.11)
RDW: 17.5 % — ABNORMAL HIGH (ref 11.5–15.5)
WBC: 8.9 10*3/uL (ref 4.0–10.5)

## 2013-03-15 LAB — CARBOXYHEMOGLOBIN
Methemoglobin: 1.7 % — ABNORMAL HIGH (ref 0.0–1.5)
O2 Saturation: 54.8 %
Total hemoglobin: 9.6 g/dL — ABNORMAL LOW (ref 12.0–16.0)

## 2013-03-15 LAB — PROTIME-INR: INR: 2.24 — ABNORMAL HIGH (ref 0.00–1.49)

## 2013-03-15 MED ORDER — POTASSIUM CHLORIDE CRYS ER 20 MEQ PO TBCR
40.0000 meq | EXTENDED_RELEASE_TABLET | Freq: Once | ORAL | Status: AC
  Administered 2013-03-15: 40 meq via ORAL
  Filled 2013-03-15: qty 2

## 2013-03-15 MED ORDER — POTASSIUM CHLORIDE CRYS ER 10 MEQ PO TBCR
10.0000 meq | EXTENDED_RELEASE_TABLET | Freq: Two times a day (BID) | ORAL | Status: DC
  Administered 2013-03-15 – 2013-03-20 (×11): 10 meq via ORAL
  Filled 2013-03-15 (×13): qty 1

## 2013-03-15 MED ORDER — TECHNETIUM TC 99M SULFUR COLLOID
2.0000 | Freq: Once | INTRAVENOUS | Status: AC | PRN
  Administered 2013-03-15: 2 via INTRAVENOUS

## 2013-03-15 MED ORDER — WARFARIN SODIUM 2.5 MG PO TABS
2.5000 mg | ORAL_TABLET | Freq: Once | ORAL | Status: AC
  Administered 2013-03-15: 2.5 mg via ORAL
  Filled 2013-03-15: qty 1

## 2013-03-15 NOTE — Progress Notes (Addendum)
CSW received phone call from patient's daughter. Daughter stated that she has an appointment with financial counseling on Monday and had a few questions about unemployment for her mom. CSW provided daughter with the unemployment phone number.  No other needs identified at this time. CSW signing off at this time. Please re consult if social work needs arise.    [name], MSW, Theresia Majors [phone]

## 2013-03-15 NOTE — Progress Notes (Addendum)
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 Post-op couse c/b RHF requiring take back to the OR for bleeding and temproray RVAD support.   CT C/A/P showed persistent air around around driveline as well as small loculated R pleural effusion ? possible empyema. PICC placed and started on abx vanc/ceftaz and rifampin.   Admitted 11/6 with nausea. Echo 03/09/13 Unable to see valves well. RV markedly dilated and severely hypokinetic. Septal flattening c/w RV pressure volume overload.   TEE 03/12/13 showed significant RV dysfunction. no vegetation on valves or ICD wires. RHC 03/12/13 d/t worsening RHF. Started on milrinone 0.25. Changed to dobutamine gtt.   Dobutamine increased to 5 yesterday. Co-ox not changed.  57->55%  Cr back down to 1.35. Diuresing well.  Weight stable at 154. Abdominal pain improved. Reglan started. Gastric emptying study planned for today. Breathing improved on scheduled benzos.   Co-ox not improving significantly on dobutamine VAD interrogation  RPM 8600 Flow 5.1L Power 6.6 PI 5.0  4 PI events    Objective:    Vital Signs:   Temp:  [97.3 F (36.3 C)-98.3 F (36.8 C)] 98 F (36.7 C) (11/12 2252) Pulse Rate:  [60-96] 87 (11/13 0500) Resp:  [18-53] 38 (11/13 0500) SpO2:  [93 %-100 %] 96 % (11/13 0500) Last BM Date: 03/13/13 Filed Weights   03/12/13 0600 03/13/13 0500 03/14/13 0500  Weight: 68.9 kg (151 lb 14.4 oz) 70.1 kg (154 lb 8.7 oz) 70 kg (154 lb 5.2 oz)    Physical Exam: GENERAL: Fatigued appearing,no acute distress; sitting in chair HEENT: normal  NECK: Supple, JVP 8 . 2+ bilaterally, no bruits. No lymphadenopathy or thyromegaly appreciated.  CARDIAC: Mechanical heart sounds with LVAD hum present.   LUNGS: Clear to auscultation bilaterally.  ABDOMEN: Soft, round, tender around rib cage, positive bowel sounds x4.  LVAD exit site: well-healed and incorporated. Dressing dry and intact. No erythema or drainage. Stabilization device present and accurately applied. No tenderness to palpation at all along driveline tract.  EXTREMITIES:  no cyanosis, clubbing, rash. Tr edema; RUE 2 PICC NEUROLOGIC: Alert and oriented x 4. Gait steady. No aphasia. No dysarthria. Affect pleasant.     Labs: Basic Metabolic Panel:  Recent Labs Lab 03/11/13 0403 03/12/13 0500 03/13/13 0345 03/14/13 0415 03/15/13 0355  NA 133* 132* 133* 134* 136  K 4.7 4.6 4.4 4.2 3.3*  CL 99 100 100 100 101  CO2 22 23 21 22 23   GLUCOSE 100* 90 88 98 103*  BUN 22 20 18 23 22   CREATININE 1.40* 1.24* 1.25* 1.46* 1.35*  CALCIUM 9.9 10.1 10.0 9.5 9.4    Liver Function Tests:  Recent Labs Lab 03/08/13 2155 03/09/13 1000  AST 22 20  ALT 22 19  ALKPHOS 104 86  BILITOT 0.8 1.5*  PROT 7.8 7.0  ALBUMIN 4.6 4.0   No results found for this basename: LIPASE, AMYLASE,  in the last 168 hours No results found for this basename: AMMONIA,  in the last 168 hours  CBC:  Recent Labs Lab 03/11/13 0403 03/12/13 0500 03/13/13 0345 03/14/13 0415 03/15/13 0355  WBC 9.5 9.2 9.1 8.2 8.9  NEUTROABS 7.7 7.1 6.8 6.2 7.0  HGB 10.3* 10.1* 10.0*  9.4* 9.3*  HCT 31.8* 31.6* 30.9* 29.0* 29.0*  MCV 86.6 87.3 87.0 86.1 86.8  PLT 273 268 308 260 273    Cardiac Enzymes: No results found for this basename: CKTOTAL, CKMB, CKMBINDEX, TROPONINI,  in the last 168 hours  BNP: BNP (last 3 results)  Recent Labs  02/28/13 1423 03/07/13 1307 03/08/13 2155  PROBNP 2290.0* 1875.0* 2459.0*    CBG: No results found for this basename: GLUCAP,  in the last 168 hours  Coagulation Studies:  Recent Labs  03/13/13 0345 03/14/13 0415 03/15/13 0355  LABPROT 17.8* 20.9* 24.1*  INR 1.51* 1.86* 2.24*    Other results: EKG: SR with  Mobitz Type II HB.  Imaging: Ir Fluoro Guide Cv Line Right  03/13/2013   CLINICAL DATA:  Congestive heart failure. Needs dual-lumen IV access for medications. Recently placed single lumen PICC line request to be exchanged for a dual-lumen  EXAM: IR RIGHT FLOURO GUIDE CV LINE  TECHNIQUE: The right arm was prepped with chlorhexidine, draped in the usual sterile fashion using maximum barrier technique (cap and mask, sterile gown, sterile gloves, large sterile sheet, hand hygiene and cutaneous antiseptic). Local anesthesia was attained by infiltration with 1% lidocaine.  The existing PICC line, measuring 39cm, was cut and removed over wire. A new peel-away sheath was placed over the wire, through which a 39 cm 5 [place] dual lumen power injectable PICC was advanced, and positioned with its tip at the lower SVC/right atrial junction. Fluoroscopy during the procedure and fluoro spot radiograph confirms appropriate catheter position. The catheter was flushed, secured to the skin with Prolene sutures, and covered with a sterile dressing.  FLUOROSCOPY TIME:  6 seconds  COMPLICATIONS: None.  The patient tolerated the procedure well.  IMPRESSION: Successful exchange of a right arm PICC with fluoroscopic guidance. The new dual lumen catheter is ready for use.  Read by: Brayton El PA-C   Electronically Signed   By: Irish Lack M.D.   On: 03/13/2013 14:46        Assessment:   1. Nausea 2. Dypnea 3. Chronic biventricular HF Status post HM-II LVAD implantation.  4. PAF  5. Recent VT s/p ICD shock on amio 6. R pleural effusion - ? Empyema 7. ? driveline infection 8. Leukocytosis 9. Transient CHB 10. Chronic renal failure, stage III 11. Recurrent RHF, on milrinone 12. Anxiety  Plan/Discussion:    Symptomatically improved on dobutamine though co-ox not changed much. Benzos seem to be helping as well. If co-ox not improving may consider switch back to milrinone for better RV support. Continue  sildenafil. Will check gastric emptying study. I think treating anxiety is helping.  INR therapeutic. Given occasional bradycardia and CHB will turn backup rate on pacer to 60  VAD parameters look ok.  Ambulate with cardiac rehab.  [name] 6:38 AM

## 2013-03-15 NOTE — Progress Notes (Signed)
UR Completed.  Kaitlin [name] Jane [phone] 03/15/2013  

## 2013-03-15 NOTE — Progress Notes (Signed)
ANTICOAGULATION CONSULT NOTE   Pharmacy Consult for Lovenox>>Coumadin Indication: LVAD  No Known Allergies  Patient Measurements: Height: 5\' 3"  (160 cm) Weight: 153 lb (69.4 kg) IBW/kg (Calculated) : 52.4  Vital Signs: Temp: 98 F (36.7 C) (11/12 2252) Temp src: Oral (11/12 2252) Pulse Rate: 88 (11/13 0700)  Labs:  Recent Labs  03/13/13 0345 03/14/13 0415 03/15/13 0355  HGB 10.0* 9.4* 9.3*  HCT 30.9* 29.0* 29.0*  PLT 308 260 273  LABPROT 17.8* 20.9* 24.1*  INR 1.51* 1.86* 2.24*  CREATININE 1.25* 1.46* 1.35*    Estimated Creatinine Clearance: 38.8 ml/min (by C-G formula based on Cr of 1.35).  Assessment: 67yof s/p LVAD implant 12/2012. She presents with possible empyema/LVAD infection.  Initially started on broad spectrum abx - now off per ID recommendations.   Pt on coumadin for anticoagulation for LVAD. INR up this morning to 2.2. Hgb stable overall. No bleeding noted. TEE insignificant for vegetations. Will stop lovenox bridge now that INR>2.   Home dose Coumadin 5mg  MWF, 2.5mg  TTSS  Goal of Therapy:  INR 2-3 Monitor platelets by anticoagulation protocol: Yes   Plan:  Stop lovenox Will give warfarin 2.5 mg tonight  Daily INR  Sheppard Coil PharmD., BCPS Clinical Pharmacist Pager [phone] 03/15/2013 7:35 AM

## 2013-03-16 DIAGNOSIS — F411 Generalized anxiety disorder: Secondary | ICD-10-CM

## 2013-03-16 LAB — CBC WITH DIFFERENTIAL/PLATELET
Basophils Absolute: 0 10*3/uL (ref 0.0–0.1)
Basophils Relative: 0 % (ref 0–1)
Eosinophils Relative: 2 % (ref 0–5)
HCT: 27.9 % — ABNORMAL LOW (ref 36.0–46.0)
Hemoglobin: 9.3 g/dL — ABNORMAL LOW (ref 12.0–15.0)
Lymphocytes Relative: 9 % — ABNORMAL LOW (ref 12–46)
Lymphs Abs: 0.7 10*3/uL (ref 0.7–4.0)
MCH: 28.4 pg (ref 26.0–34.0)
MCHC: 33.3 g/dL (ref 30.0–36.0)
MCV: 85.3 fL (ref 78.0–100.0)
Monocytes Absolute: 0.8 10*3/uL (ref 0.1–1.0)
Monocytes Relative: 10 % (ref 3–12)
Neutrophils Relative %: 79 % — ABNORMAL HIGH (ref 43–77)
RBC: 3.27 MIL/uL — ABNORMAL LOW (ref 3.87–5.11)
RDW: 17.6 % — ABNORMAL HIGH (ref 11.5–15.5)

## 2013-03-16 LAB — BASIC METABOLIC PANEL
BUN: 17 mg/dL (ref 6–23)
CO2: 23 mEq/L (ref 19–32)
Chloride: 102 mEq/L (ref 96–112)
Creatinine, Ser: 1.24 mg/dL — ABNORMAL HIGH (ref 0.50–1.10)
Glucose, Bld: 92 mg/dL (ref 70–99)
Potassium: 3.9 mEq/L (ref 3.5–5.1)
Sodium: 138 mEq/L (ref 135–145)

## 2013-03-16 LAB — PROTIME-INR
INR: 2.09 — ABNORMAL HIGH (ref 0.00–1.49)
Prothrombin Time: 22.8 seconds — ABNORMAL HIGH (ref 11.6–15.2)

## 2013-03-16 LAB — CARBOXYHEMOGLOBIN: Carboxyhemoglobin: 1.1 % (ref 0.5–1.5)

## 2013-03-16 MED ORDER — WARFARIN SODIUM 5 MG PO TABS
5.0000 mg | ORAL_TABLET | Freq: Once | ORAL | Status: AC
  Administered 2013-03-16: 5 mg via ORAL
  Filled 2013-03-16: qty 1

## 2013-03-16 MED ORDER — CLONAZEPAM 0.5 MG PO TABS
0.5000 mg | ORAL_TABLET | Freq: Two times a day (BID) | ORAL | Status: DC
  Administered 2013-03-16 – 2013-03-18 (×6): 0.5 mg via ORAL
  Filled 2013-03-16 (×7): qty 1

## 2013-03-16 NOTE — Progress Notes (Addendum)
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 Post-op couse c/b RHF requiring take back to the OR for bleeding and temproray RVAD support.   Admitted 11/6 with nausea. Echo 03/09/13 Unable to see valves well. RV markedly dilated and severely hypokinetic. Septal flattening c/w RV pressure volume overload.   TEE 03/12/13 showed significant RV dysfunction. no vegetation on valves or ICD wires. RHC 03/12/13 d/t worsening RHF. Started on milrinone 0.25. Changed to dobutamine gtt. Now at 69mcg/min  Feeling ok. Denies CP or dyspnea. Gastric emptying study normal. Co-ox up to 63% (Fick CO/CI 4.0/2.3)   VAD interrogation  RPM 8600 Flow 4.5L Power 5.0 PI 5.4 4 PI events    Objective:    Vital Signs:   Temp:  [97.7 F (36.5 C)-97.9 F (36.6 C)] 97.7 F (36.5 C) (11/14 0800) Pulse Rate:  [33-105] 33 (11/14 1200) Resp:  [13-46] 26 (11/14 1200) SpO2:  [90 %-99 %] 96 % (11/14 1200) Weight:  [152 lb 4.8 oz (69.083 kg)] 152 lb 4.8 oz (69.083 kg) (11/14 0630) Last BM Date: 03/13/13 Filed Weights   03/14/13 0500 03/15/13 0500 03/16/13 0630  Weight: 154 lb 5.2 oz (70 kg) 153 lb (69.4 kg) 152 lb 4.8 oz (69.083 kg)    Physical Exam: GENERAL: Fatigued appearing,no acute distress; sitting in chair HEENT: normal  NECK: Supple, JVP 8 . 2+ bilaterally, no bruits. No lymphadenopathy or thyromegaly appreciated.  CARDIAC: Mechanical heart sounds with LVAD hum present.  LUNGS: Clear to auscultation bilaterally.  ABDOMEN: Soft, round, tender around rib cage, positive bowel sounds x4.  LVAD exit site: well-healed and incorporated. Dressing dry and intact. No erythema or drainage. Stabilization device present and accurately applied. No tenderness to  palpation at all along driveline tract.  EXTREMITIES:  no cyanosis, clubbing, rash. Tr edema; RUE 2 PICC NEUROLOGIC: Alert and oriented x 4. Gait steady. No aphasia. No dysarthria. Affect pleasant.     Labs: Basic Metabolic Panel:  Recent Labs Lab 03/12/13 0500 03/13/13 0345 03/14/13 0415 03/15/13 0355 03/16/13 0500  NA 132* 133* 134* 136 138  K 4.6 4.4 4.2 3.3* 3.9  CL 100 100 100 101 102  CO2 23 21 22 23 23   GLUCOSE 90 88 98 103* 92  BUN 20 18 23 22 17   CREATININE 1.24* 1.25* 1.46* 1.35* 1.24*  CALCIUM 10.1 10.0 9.5 9.4 9.3    Liver Function Tests: No results found for this basename: AST, ALT, ALKPHOS, BILITOT, PROT, ALBUMIN,  in the last 168 hours No results found for this basename: LIPASE, AMYLASE,  in the last 168 hours No results found for this basename: AMMONIA,  in the last 168 hours  CBC:  Recent Labs Lab 03/12/13 0500 03/13/13 0345 03/14/13 0415 03/15/13 0355 03/16/13 0500  WBC 9.2 9.1 8.2 8.9 8.0  NEUTROABS 7.1 6.8 6.2 7.0 6.3  HGB 10.1* 10.0* 9.4* 9.3* 9.3*  HCT 31.6* 30.9* 29.0* 29.0* 27.9*  MCV 87.3 87.0 86.1 86.8 85.3  PLT 268 308 260 273 255    Cardiac Enzymes: No results found for this basename: CKTOTAL, CKMB, CKMBINDEX, TROPONINI,  in the last 168 hours  BNP: BNP (last 3 results)  Recent Labs  02/28/13 1423 03/07/13 1307 03/08/13  2155  PROBNP 2290.0* 1875.0* 2459.0*    CBG: No results found for this basename: GLUCAP,  in the last 168 hours  Coagulation Studies:  Recent Labs  03/14/13 0415 03/15/13 0355 03/16/13 0500  LABPROT 20.9* 24.1* 22.8*  INR 1.86* 2.24* 2.09*    Other results: EKG: SR with Mobitz Type II HB.  Imaging: Nm Gastric Emptying  03/15/2013   CLINICAL DATA:  Abdominal pain and fullness.  EXAM: NUCLEAR MEDICINE GASTRIC EMPTYING SCAN  TECHNIQUE: After oral ingestion of radiolabeled meal, sequential abdominal images were obtained for 120 minutes. Residual percentage of activity remaining within the stomach  was calculated at 60 and 120 minutes.  COMPARISON:  None.  RADIOPHARMACEUTICALS:  2technetium 99-M labeled sulfur colloid  FINDINGS: Expected location of the stomach in the left upper quadrant. Ingested meal empties the stomach gradually over the course of the study with 67 % retention at 60 min and 26 % retention at 120 min (normal retention less than 30% at a 120 min).  IMPRESSION: Gastric emptying is within normal limits.   Electronically Signed   By: Drusilla Kanner M.D.   On: 03/15/2013 17:16        Assessment:   1. Nausea 2. Dypnea 3. Chronic biventricular HF Status post HM-II LVAD implantation.  4. PAF  5. Recent VT s/p ICD shock on amio 6. R pleural effusion - ? Empyema 7. ? driveline infection 8. Leukocytosis 9. Transient CHB 10. Chronic renal failure, stage III 11. Recurrent RHF, on milrinone 12. Anxiety  Plan/Discussion:    Improved on dobutamine. Will arrange for home inotropes for RV support.  Continue sildenafil.  I also think kloonpin will help her with anxiety.   Volume status looks ok.  With occasional CHB and bradycardia back-up rate on ICD increased to 70 bpm during day and 60 bpm at night.  INR therapeutic. Given occasional bradycardia and CHB will turn backup rate on pacer to 60  VAD parameters look ok.  Ambulate with cardiac rehab. Can transfer to 2W  Possible home in next day or two. Daughter considering SNF placement but only [facility] will take VADs.  Will need to expedite transplant evaluation at [facility] as outpatient.   Reuel Boom Bensimhon,MD 12:47 PM

## 2013-03-16 NOTE — Progress Notes (Signed)
Progress Note from the Palliative Medicine Team at [facility]  Subjective:  -patient awakens easily from nap and engages in conversation with this NP  -she expresses her hopes and concerns and tells me she anticipates dc home in the next few days  - she expresses feelings  of "anxiety" at times   Objective: No Known Allergies Scheduled Meds: . amiodarone  100 mg Oral Daily  . amLODipine  10 mg Oral Daily  . aspirin EC  325 mg Oral Daily  . citalopram  20 mg Oral Daily  . clonazePAM  0.5 mg Oral BID  . digoxin  0.0625 mg Oral Daily  . feeding supplement (RESOURCE BREEZE)  1 Container Oral BID  . ferrous fumarate-b12-vitamic C-folic acid  1 capsule Oral TID PC  . hydrALAZINE  25 mg Oral Q8H  . metoCLOPramide (REGLAN) injection  5 mg Intravenous Q8H  . pantoprazole  40 mg Oral Daily  . potassium chloride  10 mEq Oral BID  . sildenafil  40 mg Oral TID  . simvastatin  10 mg Oral QHS  . sodium chloride  10-40 mL Intracatheter Q12H  . sodium chloride  3 mL Intravenous Q12H  . spironolactone  25 mg Oral Daily  . torsemide  20 mg Oral Daily  . warfarin  5 mg Oral ONCE-1800  . Warfarin - Pharmacist Dosing Inpatient   Does not apply q1800   Continuous Infusions: . sodium chloride 15 mL/hr at 03/16/13 1104  . DOBUTamine 5 mcg/kg/min (03/16/13 1500)   PRN Meds:.sodium chloride, acetaminophen, ALPRAZolam, ondansetron (ZOFRAN) IV, sodium chloride, sodium chloride, zolpidem  Pulse 75  Temp(Src) 97.7 F (36.5 C) (Oral)  Resp 29  Ht 5\' 3"  (1.6 m)  Wt 69.083 kg (152 lb 4.8 oz)  BMI 26.99 kg/m2  SpO2 96%   PPS:40 %  Pain Score denies    Intake/Output Summary (Last 24 hours) at 03/16/13 1529 Last data filed at 03/16/13 1500  Gross per 24 hour  Intake  747.2 ml  Output    900 ml  Net -152.8 ml       Physical Exam:  General: fatigued, engaged in conversation HEENT:  Mm, no exudate Chest:   CTA CVS: mechanical heart sounds noted, + LVAD hum Abdomen: soft NT +BS,  drive  line dressing CDI Ext: without edema Neuro: alert and oriented X3 Psych: denies depression/sadness. Expresses positive statements regarding the future  Labs: CBC    Component Value Date/Time   WBC 8.0 03/16/2013 0500   RBC 3.27* 03/16/2013 0500   HGB 9.3* 03/16/2013 0500   HCT 27.9* 03/16/2013 0500   PLT 255 03/16/2013 0500   MCV 85.3 03/16/2013 0500   MCH 28.4 03/16/2013 0500   MCHC 33.3 03/16/2013 0500   RDW 17.6* 03/16/2013 0500   LYMPHSABS 0.7 03/16/2013 0500   MONOABS 0.8 03/16/2013 0500   EOSABS 0.2 03/16/2013 0500   BASOSABS 0.0 03/16/2013 0500    BMET    Component Value Date/Time   NA 138 03/16/2013 0500   K 3.9 03/16/2013 0500   CL 102 03/16/2013 0500   CO2 23 03/16/2013 0500   GLUCOSE 92 03/16/2013 0500   BUN 17 03/16/2013 0500   CREATININE 1.24* 03/16/2013 0500   CALCIUM 9.3 03/16/2013 0500   GFRNONAA 45* 03/16/2013 0500   GFRAA 52* 03/16/2013 0500    CMP     Component Value Date/Time   NA 138 03/16/2013 0500   K 3.9 03/16/2013 0500   CL 102 03/16/2013 0500   CO2  23 03/16/2013 0500   GLUCOSE 92 03/16/2013 0500   BUN 17 03/16/2013 0500   CREATININE 1.24* 03/16/2013 0500   CALCIUM 9.3 03/16/2013 0500   PROT 7.0 03/09/2013 1000   ALBUMIN 4.0 03/09/2013 1000   AST 20 03/09/2013 1000   ALT 19 03/09/2013 1000   ALKPHOS 86 03/09/2013 1000   BILITOT 1.5* 03/09/2013 1000   GFRNONAA 45* 03/16/2013 0500   GFRAA 52* 03/16/2013 0500    Assessment and Plan:  1. Symptom Control:         Anxiety/Depression: continue present mediations, see MAR         -demonstrated relaxation breathing technique and patient returned demonstration          -hand massage with relaxation imagery  2. Psycho/Social:   Emotional support offered 3. Spiritual  Strong faith is positive resilience factor 4. Disposition:  Home with home health, family assistance     Time In Time Out Total Time Spent with Patient Total Overall Time  1245 1320 35 min 35 min    Greater than 50%   of this time was spent counseling and coordinating care related to the above assessment and plan.  Lorinda Creed NP  Palliative Medicine Team Team Phone # [phone] Pager [phone]   1

## 2013-03-16 NOTE — Progress Notes (Signed)
ANTICOAGULATION CONSULT NOTE   Pharmacy Consult for Coumadin Indication: LVAD  No Known Allergies  Patient Measurements: Height: 5\' 3"  (160 cm) Weight: 152 lb 4.8 oz (69.083 kg) IBW/kg (Calculated) : 52.4  Vital Signs: Pulse Rate: 82 (11/14 0800)  Labs:  Recent Labs  03/14/13 0415 03/15/13 0355 03/16/13 0500  HGB 9.4* 9.3* 9.3*  HCT 29.0* 29.0* 27.9*  PLT 260 273 255  LABPROT 20.9* 24.1* 22.8*  INR 1.86* 2.24* 2.09*  CREATININE 1.46* 1.35* 1.24*    Estimated Creatinine Clearance: 42.2 ml/min (by C-G formula based on Cr of 1.24).  Assessment: 67yof s/p LVAD implant 12/2012. She presents with possible empyema/LVAD infection.  Initially started on broad spectrum abx - now off per ID recommendations.   Pt on coumadin for anticoagulation for LVAD. INR stable this morning at 2.09. Hgb stable. No bleeding noted. TEE insignificant for vegetations. Off lovenox  Home dose Coumadin 5mg  MWF, 2.5mg  TTSS  Goal of Therapy:  INR 2-3 Monitor platelets by anticoagulation protocol: Yes   Plan:  Will give warfarin 5 mg tonight  Daily INR  Sheppard Coil PharmD., BCPS Clinical Pharmacist Pager [phone] 03/16/2013 8:21 AM

## 2013-03-16 NOTE — Progress Notes (Signed)
CARDIAC REHAB PHASE I   PRE:  Rate/Rhythm: 87 SR BBB  BP:  Supine:   Sitting: 90 dopplered  Standing:    SaO2: 99 2L  MODE:  Ambulation: 450 ft   POST:  Rate/Rhythm: 122 ST BBB  BP:  Supine:   Sitting: 98 dopplered  Standing:    SaO2: 97 2L 1350-1445 Assisted X 2 used w/c and  O2 2L to ambulate. Gait steady holding to w/c. She is DOE and took several standing rest stops. Pt was able to walk 450 feet. She states that she dislikes the recliner, so she wanted to sit on side of bed after walk. Call light in reach.  Melina Copa RN 03/16/2013 2:39 PM

## 2013-03-17 LAB — CBC WITH DIFFERENTIAL/PLATELET
Eosinophils Relative: 2 % (ref 0–5)
HCT: 28.2 % — ABNORMAL LOW (ref 36.0–46.0)
Hemoglobin: 9.1 g/dL — ABNORMAL LOW (ref 12.0–15.0)
Lymphocytes Relative: 10 % — ABNORMAL LOW (ref 12–46)
Lymphs Abs: 0.8 10*3/uL (ref 0.7–4.0)
MCH: 28.1 pg (ref 26.0–34.0)
MCV: 87 fL (ref 78.0–100.0)
Monocytes Relative: 10 % (ref 3–12)
Neutrophils Relative %: 79 % — ABNORMAL HIGH (ref 43–77)
Platelets: 260 10*3/uL (ref 150–400)
RBC: 3.24 MIL/uL — ABNORMAL LOW (ref 3.87–5.11)
WBC: 8.4 10*3/uL (ref 4.0–10.5)

## 2013-03-17 LAB — BASIC METABOLIC PANEL
CO2: 22 mEq/L (ref 19–32)
Calcium: 9.8 mg/dL (ref 8.4–10.5)
GFR calc Af Amer: 45 mL/min — ABNORMAL LOW (ref >90)
GFR calc non Af Amer: 39 mL/min — ABNORMAL LOW (ref >90)
Glucose, Bld: 103 mg/dL — ABNORMAL HIGH (ref 70–99)
Potassium: 3.9 mEq/L (ref 3.5–5.1)
Sodium: 133 mEq/L — ABNORMAL LOW (ref 135–145)

## 2013-03-17 LAB — CARBOXYHEMOGLOBIN
Methemoglobin: 1.3 % (ref 0.0–1.5)
O2 Saturation: 61.4 %
Total hemoglobin: 9.3 g/dL — ABNORMAL LOW (ref 12.0–16.0)

## 2013-03-17 LAB — PROTIME-INR: INR: 2.26 — ABNORMAL HIGH (ref 0.00–1.49)

## 2013-03-17 MED ORDER — FUROSEMIDE 10 MG/ML IJ SOLN
40.0000 mg | Freq: Once | INTRAMUSCULAR | Status: AC
  Administered 2013-03-17: 40 mg via INTRAVENOUS
  Filled 2013-03-17: qty 4

## 2013-03-17 MED ORDER — WARFARIN SODIUM 2.5 MG PO TABS
2.5000 mg | ORAL_TABLET | Freq: Once | ORAL | Status: AC
Start: 2013-03-17 — End: 2013-03-17
  Administered 2013-03-17: 2.5 mg via ORAL
  Filled 2013-03-17: qty 1

## 2013-03-17 NOTE — Progress Notes (Signed)
Pt transferred to room 2W24.  Pt transferred with LVAD on battery back.  Pt ambulated on room air and on telemetry until feeling SOB and tired.  2L O2 applied. Pt pushed in wheelchair the rest of the way to the new room.  Pt positioned comfortably in bed, feeling better and comfortably after some rest.  VSS.

## 2013-03-17 NOTE — Progress Notes (Signed)
Advanced Heart Failure Team History and Physical Note    HPI:    Ms. 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 Post-op couse c/b RHF requiring take back to the OR for bleeding and temproray RVAD support.   Admitted 11/6 with nausea. Echo 03/09/13 Unable to see valves well. RV markedly dilated and severely hypokinetic. Septal flattening c/w RV pressure volume overload.   TEE 03/12/13 showed significant RV dysfunction. no vegetation on valves or ICD wires. RHC 03/12/13 d/t worsening RHF. Started on milrinone 0.25. Changed to dobutamine gtt. Now at 49mcg/min  Still c/o dyspnea. Occasional wheezing. Weight unchanged.    VAD interrogation  RPM 8600 Flow 4.6L Power 5.3 PI 5.2 Rare PI events    Objective:    Vital Signs:   Temp:  [97.3 F (36.3 C)-97.5 F (36.4 C)] 97.5 F (36.4 C) (11/15 0743) Pulse Rate:  [33-119] 89 (11/15 0900) Resp:  [17-32] 28 (11/15 0900) SpO2:  [92 %-100 %] 94 % (11/15 0900) Weight:  [69.355 kg (152 lb 14.4 oz)] 69.355 kg (152 lb 14.4 oz) (11/15 0642) Last BM Date: 03/13/13 Filed Weights   03/15/13 0500 03/16/13 0630 03/17/13 0642  Weight: 69.4 kg (153 lb) 69.083 kg (152 lb 4.8 oz) 69.355 kg (152 lb 14.4 oz)    Physical Exam: GENERAL: Fatigued appearing,no acute distress; sitting up on side of bed HEENT: normal  NECK: Supple, JVP 10 . 2+ bilaterally, no bruits. No lymphadenopathy or thyromegaly appreciated.  CARDIAC: Mechanical heart sounds with LVAD hum present.  LUNGS: Clear to auscultation bilaterally.  ABDOMEN: Soft, round, tender around rib cage, positive bowel sounds x4.  LVAD exit site: well-healed and incorporated. Dressing dry and intact. No erythema or drainage. Stabilization device present and accurately applied. No tenderness to palpation at all along  driveline tract.  EXTREMITIES:  no cyanosis, clubbing, rash. Tr edema; RUE 2 PICC NEUROLOGIC: Alert and oriented x 4. Gait steady. No aphasia. No dysarthria. Affect pleasant.     Labs: Basic Metabolic Panel:  Recent Labs Lab 03/13/13 0345 03/14/13 0415 03/15/13 0355 03/16/13 0500 03/17/13 0518  NA 133* 134* 136 138 133*  K 4.4 4.2 3.3* 3.9 3.9  CL 100 100 101 102 100  CO2 21 22 23 23 22   GLUCOSE 88 98 103* 92 103*  BUN 18 23 22 17 19   CREATININE 1.25* 1.46* 1.35* 1.24* 1.38*  CALCIUM 10.0 9.5 9.4 9.3 9.8    Liver Function Tests: No results found for this basename: AST, ALT, ALKPHOS, BILITOT, PROT, ALBUMIN,  in the last 168 hours No results found for this basename: LIPASE, AMYLASE,  in the last 168 hours No results found for this basename: AMMONIA,  in the last 168 hours  CBC:  Recent Labs Lab 03/13/13 0345 03/14/13 0415 03/15/13 0355 03/16/13 0500 03/17/13 0518  WBC 9.1 8.2 8.9 8.0 8.4  NEUTROABS 6.8 6.2 7.0 6.3 6.6  HGB 10.0* 9.4* 9.3* 9.3* 9.1*  HCT 30.9* 29.0* 29.0* 27.9* 28.2*  MCV 87.0 86.1 86.8 85.3 87.0  PLT 308 260 273 255 260    Cardiac Enzymes: No results found for this basename: CKTOTAL, CKMB, CKMBINDEX, TROPONINI,  in the last 168 hours  BNP: BNP (last 3 results)  Recent Labs  02/28/13 1423 03/07/13 1307 03/08/13 2155  PROBNP 2290.0* 1875.0* 2459.0*  CBG: No results found for this basename: GLUCAP,  in the last 168 hours  Coagulation Studies:  Recent Labs  03/15/13 0355 03/16/13 0500 03/17/13 0518  LABPROT 24.1* 22.8* 24.2*  INR 2.24* 2.09* 2.26*    Other results: EKG: SR with Mobitz Type II HB.  Imaging: Nm Gastric Emptying  03/15/2013   CLINICAL DATA:  Abdominal pain and fullness.  EXAM: NUCLEAR MEDICINE GASTRIC EMPTYING SCAN  TECHNIQUE: After oral ingestion of radiolabeled meal, sequential abdominal images were obtained for 120 minutes. Residual percentage of activity remaining within the stomach was calculated at 60  and 120 minutes.  COMPARISON:  None.  RADIOPHARMACEUTICALS:  2technetium 99-M labeled sulfur colloid  FINDINGS: Expected location of the stomach in the left upper quadrant. Ingested meal empties the stomach gradually over the course of the study with 67 % retention at 60 min and 26 % retention at 120 min (normal retention less than 30% at a 120 min).  IMPRESSION: Gastric emptying is within normal limits.   Electronically Signed   By: Drusilla Kanner M.D.   On: 03/15/2013 17:16        Assessment:   1. Nausea 2. Dypnea 3. Chronic biventricular HF Status post HM-II LVAD implantation.  4. PAF  5. Recent VT s/p ICD shock on amio 6. R pleural effusion - ? Empyema 7. ? driveline infection 8. Leukocytosis 9. Transient CHB 10. Chronic renal failure, stage III 11. Recurrent RHF, on milrinone 12. Anxiety  Plan/Discussion:    Improved on dobutamine. Have placed order for home inotropes for RV support.  Continue sildenafil.  I also think klonopin is helping her with anxiety.   Volume status minimally elevated. Will give one dose IV lasix today.   With occasional CHB and bradycardia back-up rate on ICD increased to 70 bpm during day and 60 bpm at night.  INR therapeutic.   VAD parameters look ok.  Ambulate with cardiac rehab. Can transfer to 2W  Possible home in next day or two. Daughter considering SNF placement but only [facility] will take VADs so I don't think this is a good option.   Will need to expedite transplant evaluation at [facility] as outpatient.   Hildegarde Dunaway,MD 11:00 AM

## 2013-03-17 NOTE — Progress Notes (Signed)
ANTICOAGULATION CONSULT NOTE   Pharmacy Consult for Coumadin Indication: LVAD  No Known Allergies  Patient Measurements: Height: 5\' 3"  (160 cm) Weight: 152 lb 14.4 oz (69.355 kg) IBW/kg (Calculated) : 52.4  Vital Signs: Temp: 97.5 F (36.4 C) (11/15 1147) Temp src: Oral (11/15 1147) Pulse Rate: 88 (11/15 1000)  Labs:  Recent Labs  03/15/13 0355 03/16/13 0500 03/17/13 0518  HGB 9.3* 9.3* 9.1*  HCT 29.0* 27.9* 28.2*  PLT 273 255 260  LABPROT 24.1* 22.8* 24.2*  INR 2.24* 2.09* 2.26*  CREATININE 1.35* 1.24* 1.38*    Estimated Creatinine Clearance: 38 ml/min (by C-G formula based on Cr of 1.38).  Assessment: 67yof s/p LVAD implant 12/2012. She presents with possible empyema/LVAD infection.  Initially started on broad spectrum abx - now off per ID recommendations.   Pt on coumadin for anticoagulation for LVAD. INR stable this morning at 2.26. Hgb stable. No bleeding noted. TEE insignificant for vegetations. Off lovenox  Home dose Coumadin 5mg  MWF, 2.5mg  TTSS  Goal of Therapy:  INR 2-3 Monitor platelets by anticoagulation protocol: Yes   Plan:  Will give warfarin 2.5 mg tonight  Daily INR  Okey Regal, PharmD Clinical Pharmacist Pager [phone] 03/17/2013 1:27 PM

## 2013-03-18 LAB — PROTIME-INR
INR: 2.11 — ABNORMAL HIGH (ref 0.00–1.49)
Prothrombin Time: 23 seconds — ABNORMAL HIGH (ref 11.6–15.2)

## 2013-03-18 LAB — CBC WITH DIFFERENTIAL/PLATELET
Basophils Absolute: 0 10*3/uL (ref 0.0–0.1)
Basophils Relative: 0 % (ref 0–1)
MCHC: 32.5 g/dL (ref 30.0–36.0)
MCV: 86.8 fL (ref 78.0–100.0)
Monocytes Absolute: 0.7 10*3/uL (ref 0.1–1.0)
Neutro Abs: 6.6 10*3/uL (ref 1.7–7.7)
Neutrophils Relative %: 79 % — ABNORMAL HIGH (ref 43–77)
Platelets: 271 10*3/uL (ref 150–400)
RBC: 3.19 MIL/uL — ABNORMAL LOW (ref 3.87–5.11)
RDW: 17.6 % — ABNORMAL HIGH (ref 11.5–15.5)
WBC: 8.4 10*3/uL (ref 4.0–10.5)

## 2013-03-18 LAB — CARBOXYHEMOGLOBIN
Carboxyhemoglobin: 1.3 % (ref 0.5–1.5)
Methemoglobin: 1 % (ref 0.0–1.5)
Total hemoglobin: 8.9 g/dL — ABNORMAL LOW (ref 12.0–16.0)

## 2013-03-18 LAB — BASIC METABOLIC PANEL
BUN: 20 mg/dL (ref 6–23)
CO2: 23 mEq/L (ref 19–32)
Calcium: 9.7 mg/dL (ref 8.4–10.5)
Creatinine, Ser: 1.28 mg/dL — ABNORMAL HIGH (ref 0.50–1.10)
GFR calc Af Amer: 50 mL/min — ABNORMAL LOW (ref >90)
GFR calc non Af Amer: 43 mL/min — ABNORMAL LOW (ref >90)
Potassium: 3.6 mEq/L (ref 3.5–5.1)
Sodium: 134 mEq/L — ABNORMAL LOW (ref 135–145)

## 2013-03-18 MED ORDER — POTASSIUM CHLORIDE CRYS ER 20 MEQ PO TBCR
40.0000 meq | EXTENDED_RELEASE_TABLET | Freq: Once | ORAL | Status: AC
  Administered 2013-03-18: 40 meq via ORAL
  Filled 2013-03-18: qty 2

## 2013-03-18 MED ORDER — WARFARIN SODIUM 2.5 MG PO TABS
2.5000 mg | ORAL_TABLET | Freq: Once | ORAL | Status: DC
  Filled 2013-03-18: qty 1

## 2013-03-18 MED ORDER — FUROSEMIDE 10 MG/ML IJ SOLN
80.0000 mg | Freq: Two times a day (BID) | INTRAMUSCULAR | Status: DC
  Administered 2013-03-19: 80 mg via INTRAVENOUS
  Filled 2013-03-18 (×2): qty 8

## 2013-03-18 MED ORDER — FUROSEMIDE 10 MG/ML IJ SOLN
80.0000 mg | Freq: Once | INTRAMUSCULAR | Status: AC
  Administered 2013-03-18: 80 mg via INTRAVENOUS
  Filled 2013-03-18: qty 8

## 2013-03-18 MED ORDER — SODIUM CHLORIDE 0.9 % IJ SOLN
10.0000 mL | INTRAMUSCULAR | Status: DC | PRN
  Administered 2013-03-18 – 2013-03-22 (×8): 10 mL

## 2013-03-18 NOTE — Progress Notes (Signed)
Pt amb 450 ft pushing walker with 2L O2. Pt rested once for SOB and DOE. PT returned to bed with call bell within reach. MAP 76. Will continue to monitor pt closely.

## 2013-03-18 NOTE — Progress Notes (Signed)
I have requested P.T. And O.T. Evaluations from Dr. Gala Romney to assist in evaluating pt's rehab venue needs. We will follow up on Monday. [phone]

## 2013-03-18 NOTE — Progress Notes (Signed)
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 Post-op couse c/b RHF requiring take back to the OR for bleeding and temproray RVAD support.   Admitted 11/6 with nausea. Echo 03/09/13 Unable to see valves well. RV markedly dilated and severely hypokinetic. Septal flattening c/w RV pressure volume overload.   TEE 03/12/13 showed significant RV dysfunction. no vegetation on valves or ICD wires. RHC 03/12/13 d/t worsening RHF. Started on milrinone 0.25. Changed to dobutamine gtt. Now at 79mcg/min  Still c/o dyspnea. Got one dose of IV lasix yesterday. I/O - 750cc. Weight up  5 pounds (?) Co-ox 62% MAP 80s. Renal function stable.   Says she is exhausted just walking 50 feet.  VAD interrogation  RPM 8600 Flow 4.6L Power 5.3 PI 5.2 Rare PI events    Objective:    Vital Signs:   Temp:  [97.5 F (36.4 C)-97.9 F (36.6 C)] 97.6 F (36.4 C) (11/16 0625) Resp:  [12-26] 26 (11/15 1500) SpO2:  [96 %] 96 % (11/15 1400) Weight:  [70.58 kg (155 lb 9.6 oz)] 70.58 kg (155 lb 9.6 oz) (11/16 0625) Last BM Date: 03/17/13 Filed Weights   03/16/13 0630 03/17/13 0642 03/18/13 0625  Weight: 69.083 kg (152 lb 4.8 oz) 69.355 kg (152 lb 14.4 oz) 70.58 kg (155 lb 9.6 oz)    Physical Exam: GENERAL: Fatigued appearing,no acute distress; sitting up on side of bed HEENT: normal  NECK: Supple, JVP 10 . 2+ bilaterally, no bruits. No lymphadenopathy or thyromegaly appreciated.  CARDIAC: Mechanical heart sounds with LVAD hum present.  LUNGS: Minimal crackles at the bases. ABDOMEN: Soft, round, tender around rib cage, positive bowel sounds x4.  LVAD exit site: well-healed and incorporated. Dressing dry and intact. No erythema or drainage. Stabilization device  present and accurately applied. No tenderness to palpation at all along driveline tract.  EXTREMITIES:  no cyanosis, clubbing, rash. No edema; RUE 2 PICC NEUROLOGIC: Alert and oriented x 4. Gait steady. No aphasia. No dysarthria. Affect pleasant.     Labs: Basic Metabolic Panel:  Recent Labs Lab 03/14/13 0415 03/15/13 0355 03/16/13 0500 03/17/13 0518 03/18/13 0600  NA 134* 136 138 133* 134*  K 4.2 3.3* 3.9 3.9 3.6  CL 100 101 102 100 101  CO2 22 23 23 22 23   GLUCOSE 98 103* 92 103* 98  BUN 23 22 17 19 20   CREATININE 1.46* 1.35* 1.24* 1.38* 1.28*  CALCIUM 9.5 9.4 9.3 9.8 9.7    Liver Function Tests: No results found for this basename: AST, ALT, ALKPHOS, BILITOT, PROT, ALBUMIN,  in the last 168 hours No results found for this basename: LIPASE, AMYLASE,  in the last 168 hours No results found for this basename: AMMONIA,  in the last 168 hours  CBC:  Recent Labs Lab 03/14/13 0415 03/15/13 0355 03/16/13 0500 03/17/13 0518 03/18/13 0600  WBC 8.2 8.9 8.0 8.4 8.4  NEUTROABS 6.2 7.0 6.3 6.6 6.6  HGB 9.4* 9.3* 9.3* 9.1* 9.0*  HCT 29.0* 29.0* 27.9* 28.2* 27.7*  MCV 86.1 86.8 85.3 87.0 86.8  PLT 260 273 255 260 271    Cardiac Enzymes: No results found for this basename: CKTOTAL, CKMB, CKMBINDEX, TROPONINI,  in the last 168 hours  BNP: BNP (last 3 results)  Recent Labs  02/28/13 1423 03/07/13 1307 03/08/13 2155  PROBNP 2290.0* 1875.0* 2459.0*    CBG: No results found for this basename: GLUCAP,  in the last 168 hours  Coagulation Studies:  Recent Labs  03/16/13 0500 03/17/13 0518 03/18/13 0600  LABPROT 22.8* 24.2* 23.0*  INR 2.09* 2.26* 2.11*    Imaging: No results found.      Assessment:   1. Dypnea 2. Chronic biventricular HF Status post HM-II LVAD implantation.  3. PAF  4. Recent VT s/p ICD shock on amio 5. R pleural effusion  6. Transient CHB 7. Chronic renal failure, stage III 8. Recurrent RHF, on dobutamine 9. Anxiety 10. Physical  deconditioning  Plan/Discussion:    All objective indices (co-ox, VAD parameters and exam) are improved on dobutamine but she continues with Class IV dyspnea and fatigue. Unclear etiology. I worry depression is playing a large role but she denies that  As weight is up we will give more IV lasix today. Check CXR. Provide incentive spirometer. Cardiac rehab consult. I think she may be good candidate for CIR as mobility very limited currently..   With occasional CHB and bradycardia back-up rate on ICD increased to 70 bpm during day and 60 bpm at night.  INR therapeutic.   VAD parameters look ok.   Daniel Bensimhon,MD 11:09 AM

## 2013-03-18 NOTE — Progress Notes (Signed)
ANTICOAGULATION CONSULT NOTE - Follow Up Consult  Pharmacy Consult for coumadin Indication: LVAD  No Known Allergies  Patient Measurements: Height: 5\' 3"  (160 cm) Weight: 155 lb 9.6 oz (70.58 kg) IBW/kg (Calculated) : 52.4  Vital Signs: Temp: 97.6 F (36.4 C) (11/16 0625) Temp src: Oral (11/16 0625)  Labs:  Recent Labs  03/16/13 0500 03/17/13 0518 03/18/13 0600  HGB 9.3* 9.1* 9.0*  HCT 27.9* 28.2* 27.7*  PLT 255 260 271  LABPROT 22.8* 24.2* 23.0*  INR 2.09* 2.26* 2.11*  CREATININE 1.24* 1.38* 1.28*    Estimated Creatinine Clearance: 41.3 ml/min (by C-G formula based on Cr of 1.28).  Assessment: Patient is a 67 y.o F currently on coumadin for VAD.  INR remains therapeutic at 2.11.  No bleeding documented.  Goal of Therapy:  INR 2-3 Monitor platelets by anticoagulation protocol: Yes   Plan:  1) coumadin 2.5mg  PO x1 today  Laurina Fischl P 03/18/2013,10:18 AM

## 2013-03-19 ENCOUNTER — Telehealth: Payer: Self-pay | Admitting: Licensed Clinical Social Worker

## 2013-03-19 ENCOUNTER — Inpatient Hospital Stay (HOSPITAL_COMMUNITY): Payer: Medicare Other

## 2013-03-19 LAB — BASIC METABOLIC PANEL
BUN: 24 mg/dL — ABNORMAL HIGH (ref 6–23)
CO2: 23 mEq/L (ref 19–32)
Calcium: 9.8 mg/dL (ref 8.4–10.5)
Chloride: 99 mEq/L (ref 96–112)
GFR calc non Af Amer: 38 mL/min — ABNORMAL LOW (ref >90)
Glucose, Bld: 107 mg/dL — ABNORMAL HIGH (ref 70–99)
Sodium: 134 mEq/L — ABNORMAL LOW (ref 135–145)

## 2013-03-19 LAB — CBC WITH DIFFERENTIAL/PLATELET
Basophils Absolute: 0 10*3/uL (ref 0.0–0.1)
Eosinophils Absolute: 0.1 10*3/uL (ref 0.0–0.7)
Eosinophils Relative: 1 % (ref 0–5)
Hemoglobin: 8.8 g/dL — ABNORMAL LOW (ref 12.0–15.0)
Lymphs Abs: 0.8 10*3/uL (ref 0.7–4.0)
MCH: 28.5 pg (ref 26.0–34.0)
MCHC: 33.5 g/dL (ref 30.0–36.0)
MCV: 85.1 fL (ref 78.0–100.0)
Monocytes Relative: 9 % (ref 3–12)
Neutro Abs: 7.4 10*3/uL (ref 1.7–7.7)
Platelets: 276 10*3/uL (ref 150–400)
RBC: 3.09 MIL/uL — ABNORMAL LOW (ref 3.87–5.11)

## 2013-03-19 LAB — CARBOXYHEMOGLOBIN
Methemoglobin: 1 % (ref 0.0–1.5)
O2 Saturation: 72.2 %

## 2013-03-19 LAB — PROTIME-INR: Prothrombin Time: 23.9 seconds — ABNORMAL HIGH (ref 11.6–15.2)

## 2013-03-19 MED ORDER — WARFARIN SODIUM 2.5 MG PO TABS
2.5000 mg | ORAL_TABLET | Freq: Once | ORAL | Status: AC
  Administered 2013-03-19: 2.5 mg via ORAL
  Filled 2013-03-19: qty 1

## 2013-03-19 MED ORDER — TORSEMIDE 20 MG PO TABS
20.0000 mg | ORAL_TABLET | Freq: Every day | ORAL | Status: DC
  Filled 2013-03-19: qty 1

## 2013-03-19 NOTE — Progress Notes (Signed)
Discussed with Tonye Becket, NP that pt not in need of inpt rehab at this time. [phone]

## 2013-03-19 NOTE — Progress Notes (Signed)
Advanced Heart Failure Team History and Physical Note    HPI:    Ms. Venti is a 67 y/o woman with h/o severe HTN, [name], VT s/p Biotronik ICD (2009) and 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 Post-op couse c/b RHF requiring take back to the OR for bleeding and temproray RVAD support.   Admitted 11/6 with nausea. Echo 03/09/13 Unable to see valves well. RV markedly dilated and severely hypokinetic. Septal flattening c/w RV pressure volume overload.   TEE 03/12/13 showed significant RV dysfunction. no vegetation on valves or ICD wires. RHC 03/12/13 d/t worsening RHF. Started on milrinone 0.25. Changed to dobutamine gtt. Now at 28mcg/min  Yesterday with occasional CHB and bradycardia -->back up rate adjusted to 70 bpm during the day and 60 bpm in pm.   Feels much better today. Weight down 2 pounds. Co-ox 72% MAP 80s. Had IV lasix. Creatinine up a little 1.2 >1.4  . Ambulated in hall 3 times yesterday. Mild dyspnea with exertion. Denies orthopnea. CXR ok.      VAD interrogation  RPM 8600 Flow 4.4L Power 4.5 PI 5.5 Rare PI events    Objective:    Vital Signs:   Temp:  [97.3 F (36.3 C)-97.9 F (36.6 C)] 97.9 F (36.6 C) (11/17 0518) Pulse Rate:  [80-91] 91 (11/17 0518) Resp:  [18-20] 18 (11/17 0518) SpO2:  [96 %-97 %] 96 % (11/17 0600) Weight:  [153 lb 11.2 oz (69.718 kg)] 153 lb 11.2 oz (69.718 kg) (11/17 0600) Last BM Date: 03/18/13 Filed Weights   03/17/13 0981 03/18/13 0625 03/19/13 0600  Weight: 152 lb 14.4 oz (69.355 kg) 155 lb 9.6 oz (70.58 kg) 153 lb 11.2 oz (69.718 kg)    Physical Exam: GENERAL: no acute distress; lying in bed HEENT: normal  NECK: Supple, JVP ~8-9 . 2+ bilaterally, no bruits. No lymphadenopathy or thyromegaly appreciated.  CARDIAC: Mechanical heart sounds with LVAD hum present.  LUNGS: Minimal crackles at the bases. ABDOMEN: Soft, round,  tender around rib cage, positive bowel sounds x4.  LVAD exit site: well-healed and incorporated. Dressing dry and intact. No erythema or drainage. Stabilization device present and accurately applied. No tenderness to palpation at all along driveline tract.  EXTREMITIES:  no cyanosis, clubbing, rash. Tr edema; RUE 2 PICC NEUROLOGIC: Alert and oriented x 4. Gait steady. No aphasia. No dysarthria. Affect pleasant.     Labs: Basic Metabolic Panel:  Recent Labs Lab 03/15/13 0355 03/16/13 0500 03/17/13 0518 03/18/13 0600 03/19/13 0527  NA 136 138 133* 134* 134*  K 3.3* 3.9 3.9 3.6 3.7  CL 101 102 100 101 99  CO2 23 23 22 23 23   GLUCOSE 103* 92 103* 98 107*  BUN 22 17 19 20  24*  CREATININE 1.35* 1.24* 1.38* 1.28* 1.42*  CALCIUM 9.4 9.3 9.8 9.7 9.8    Liver Function Tests: No results found for this basename: AST, ALT, ALKPHOS, BILITOT, PROT, ALBUMIN,  in the last 168 hours No results found for this basename: LIPASE, AMYLASE,  in the last 168 hours No results found for this basename: AMMONIA,  in the last 168 hours  CBC:  Recent Labs Lab 03/15/13 0355 03/16/13 0500 03/17/13 0518 03/18/13 0600 03/19/13 0527  WBC 8.9 8.0 8.4 8.4 9.1  NEUTROABS 7.0 6.3 6.6 6.6 7.4  HGB 9.3* 9.3* 9.1* 9.0* 8.8*  HCT 29.0* 27.9* 28.2* 27.7* 26.3*  MCV 86.8  85.3 87.0 86.8 85.1  PLT 273 255 260 271 276    Cardiac Enzymes: No results found for this basename: CKTOTAL, CKMB, CKMBINDEX, TROPONINI,  in the last 168 hours  BNP: BNP (last 3 results)  Recent Labs  02/28/13 1423 03/07/13 1307 03/08/13 2155  PROBNP 2290.0* 1875.0* 2459.0*    CBG: No results found for this basename: GLUCAP,  in the last 168 hours  Coagulation Studies:  Recent Labs  03/17/13 0518 03/18/13 0600 03/19/13 0527  LABPROT 24.2* 23.0* 23.9*  INR 2.26* 2.11* 2.22*    Imaging: Dg Chest 2 View  03/19/2013   CLINICAL DATA:  Short of breath. Status post left ventricular assist device.  EXAM: CHEST  2 VIEW   COMPARISON:  03/13/2013  FINDINGS: Lung base opacity and mid and lower lung zones coarse reticular and discoid opacities reflect combination of atelectasis and small effusions. No pulmonary edema. No pneumothorax. Opacity at the left lung base appears mildly improved although this apparent change is most likely technical. No new lung opacities.  Left ventricular assist device is stable. Stable left anterior chest wall ICD and right sided PICC.  Cardiac silhouette is enlarged.  No mediastinal widening.  IMPRESSION: 1. Left lung opacity appears improved, but this is most likely due to differences in technique and patient positioning. 2. Persistent areas of lung atelectasis. Probable small effusions. No pulmonary edema or pneumothorax. 3. Support apparatus is stable in well positioned.   Electronically Signed   By: Amie Portland M.D.   On: 03/19/2013 07:36    Current Facility-Administered Medications  Medication Dose Route Frequency Provider Last Rate Last Dose  . 0.9 %  sodium chloride infusion  250 mL Intravenous PRN Dolores Patty, MD      . 0.9 %  sodium chloride infusion   Intravenous Continuous Dolores Patty, MD 15 mL/hr at 03/16/13 1104    . acetaminophen (TYLENOL) tablet 650 mg  650 mg Oral Q4H PRN Dolores Patty, MD   650 mg at 03/18/13 1347  . ALPRAZolam Prudy Feeler) tablet 0.5 mg  0.5 mg Oral QID PRN Dolores Patty, MD   0.5 mg at 03/18/13 1106  . amiodarone (PACERONE) tablet 100 mg  100 mg Oral Daily Aundria Rud, NP   100 mg at 03/18/13 1052  . amLODipine (NORVASC) tablet 10 mg  10 mg Oral Daily Aundria Rud, NP   10 mg at 03/18/13 1053  . aspirin EC tablet 325 mg  325 mg Oral Daily Aundria Rud, NP   325 mg at 03/18/13 1053  . citalopram (CELEXA) tablet 20 mg  20 mg Oral Daily Aundria Rud, NP   20 mg at 03/18/13 1053  . clonazePAM (KLONOPIN) tablet 0.5 mg  0.5 mg Oral BID Dolores Patty, MD   0.5 mg at 03/18/13 2129  . digoxin (LANOXIN) tablet 0.0625 mg  0.0625 mg  Oral Daily Aundria Rud, NP   0.0625 mg at 03/18/13 1055  . DOBUTamine (DOBUTREX) infusion 4000 mcg/mL  5 mcg/kg/min Intravenous Titrated Dolores Patty, MD 5.3 mL/hr at 03/17/13 2320 5 mcg/kg/min at 03/17/13 2320  . feeding supplement (RESOURCE BREEZE) (RESOURCE BREEZE) liquid 1 Container  1 Container Oral BID Ailene Ards, RD   1 Container at 03/18/13 2148  . ferrous fumarate-b12-vitamic C-folic acid (TRINSICON / FOLTRIN) capsule 1 capsule  1 capsule Oral TID PC Aundria Rud, NP   1 capsule at 03/18/13 1334  . furosemide (LASIX) injection 80 mg  80  mg Intravenous BID Dolores Patty, MD   80 mg at 03/19/13 0750  . hydrALAZINE (APRESOLINE) tablet 25 mg  25 mg Oral Q8H Aundria Rud, NP   25 mg at 03/19/13 0605  . metoCLOPramide (REGLAN) injection 5 mg  5 mg Intravenous Q8H Dolores Patty, MD   5 mg at 03/19/13 0604  . ondansetron (ZOFRAN) injection 4 mg  4 mg Intravenous Q6H PRN Dolores Patty, MD      . pantoprazole (PROTONIX) EC tablet 40 mg  40 mg Oral Daily Aundria Rud, NP   40 mg at 03/18/13 1057  . potassium chloride (K-DUR,KLOR-CON) CR tablet 10 mEq  10 mEq Oral BID Aundria Rud, NP   10 mEq at 03/18/13 2129  . sildenafil (REVATIO) tablet 40 mg  40 mg Oral TID Dolores Patty, MD   40 mg at 03/18/13 2129  . simvastatin (ZOCOR) tablet 10 mg  10 mg Oral QHS Aundria Rud, NP   10 mg at 03/18/13 2129  . sodium chloride 0.9 % injection 10-40 mL  10-40 mL Intracatheter Q12H Dolores Patty, MD   10 mL at 03/17/13 1105  . sodium chloride 0.9 % injection 10-40 mL  10-40 mL Intracatheter PRN Dolores Patty, MD      . sodium chloride 0.9 % injection 10-40 mL  10-40 mL Intracatheter PRN Dolores Patty, MD   10 mL at 03/19/13 0520  . sodium chloride 0.9 % injection 3 mL  3 mL Intravenous Q12H Dolores Patty, MD   3 mL at 03/18/13 2131  . sodium chloride 0.9 % injection 3 mL  3 mL Intravenous PRN Dolores Patty, MD      . spironolactone  (ALDACTONE) tablet 25 mg  25 mg Oral Daily Aundria Rud, NP   25 mg at 03/18/13 1053  . torsemide (DEMADEX) tablet 20 mg  20 mg Oral Daily Aundria Rud, NP   20 mg at 03/18/13 1053  . warfarin (COUMADIN) tablet 2.5 mg  2.5 mg Oral ONCE-1800 Severiano Gilbert, [facility]      . Warfarin - Pharmacist Dosing Inpatient   Does not apply q1800 Severiano Gilbert, [facility]      . zolpidem (AMBIEN) tablet 5 mg  5 mg Oral QHS PRN Aundria Rud, NP         Assessment:   1. Dypnea 2. Chronic biventricular HF Status post HM-II LVAD implantation.  3. PAF  4. Recent VT s/p ICD shock on amio 5. R pleural effusion  6. Transient CHB 7. Chronic renal failure, stage III 8. Recurrent RHF, on dobutamine 9. Anxiety 10. Physical deconditioning  Plan/Discussion:   CO-OX 72% . Continue dobutamine at 5 mcg. Continue 40 mg sildenafil tid. Volume status improving receiving IV lasix and torsemide. Continue spironolactone for now if creatinine continues to trend up will need to stop. Creatinine bump noted. Stop IV lasix and hold torsemide.  CXR- lung atelectasis. No edema. Continue incentive spirometer.    INR therapeutic. Pharmacy dosing coumadin.   PT consult pending. ? CIR  VAD parameters look ok.   [name],[name],NP-C 8:53 AM   Patient seen and examined with Tonye Becket, NP. We discussed all aspects of the encounter. I agree with the assessment and plan as stated above.   She looks good. Feels better today. Renal function slightly worse. Functional capacity too good for CIR. Will plan d/c home tomorrow on IV dobutamine.   VAD interrogation looks good.  Tashawn Greff,MD 2:10 PM

## 2013-03-19 NOTE — Evaluation (Signed)
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 EF 15-25%.Underwent HM II LVAD implant (under destination criteria) along with TV repair on 8/18. Adm with incr weakness, dyspnea, ?pneumonia vs empyema and ? drive line infection   Clinical Impression   Kaitlin [name] currently functions at a modified independent to supervision level for selfcare tasks and functional transfers.  If in her familiar environment feel she would be modified independent.  O2 sats during my session were 90% or better on room air.  Pt has been educated on energy conservation techniques for selfcare tasks and simple home management tasks.  She has all needed OT DME.  No further acute or post acute OT needs.      OT Assessment  Patient does not need any further OT services    Follow Up Recommendations  No OT follow up       Equipment Recommendations  None recommended by OT          Precautions / Restrictions Precautions Precautions: Other (comment) Precaution Comments: LVAD Restrictions Weight Bearing Restrictions: No   Pertinent Vitals/Pain O2 sats 98% on room air at rest, decreasing to 90% with activity    ADL  Eating/Feeding: Simulated;Independent Where Assessed - Eating/Feeding: Chair Grooming: Performed;Modified independent Where Assessed - Grooming: Unsupported standing Upper Body Bathing: Simulated;Set up Where Assessed - Upper Body Bathing: Unsupported sitting Lower Body Bathing: Simulated;Set up Where Assessed - Lower Body Bathing: Unsupported sit to stand Upper Body Dressing: Simulated;Set up Where Assessed - Upper Body Dressing: Unsupported sitting Lower Body Dressing:  Simulated;Supervision/safety Where Assessed - Lower Body Dressing: Unsupported sit to stand Toilet Transfer: Performed;Modified independent Toilet Transfer Equipment: Regular height toilet Toileting - Clothing Manipulation and Hygiene: Simulated;Modified independent Where Assessed - Toileting Clothing Manipulation and Hygiene: Sit to stand from 3-in-1 or toilet Tub/Shower Transfer Method: Not assessed Transfers/Ambulation Related to ADLs: Pt is currently at a supervision level for mobility in her room without use of the assistive device. ADL Comments: Pt is overall modified independent to supervision level for selfcare tasks and functional transfers.  She does exhibit some slight dyspnea with acitivty however O2 sats remained at 90-91% while standing at the sink and performing functional transfers.  Sats at rest check at 98% prior to practicing transfers.  Discussed energy conservation strategies for bathing and kitchen tasks.  Pt reports that she can sit on her toilet as needed to perform her bath and for dressing.  She also reports that she can sit in the kitchen as well if needed during meal prep.  Pt reports using her rolllator to transport clothes from the dryer for flolding and placement in the appropriate areas.    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 EF 15-25%.Underwent HM II LVAD implant (under destination criteria) along with TV repair on 8/18. Adm with incr weakness, dyspnea, ?pneumonia vs empyema and ? drive line infection  Prior Functioning     Home Living Family/patient expects to be discharged to:: Private residence Living Arrangements: Alone Available Help at  Discharge: Family;Available PRN/intermittently Type of Home: House Home Access: Stairs to enter [employer] of Steps: 5 Entrance Stairs-Rails: Right;Left;Can reach both Home Layout: One level Home Equipment: Walker - 4 wheels Prior Function Level of Independence: Independent Comments: reports she usually walks without her rollator; if she leaves the house, someone is with her and carries her emergency bag for her Communication Communication: No difficulties Dominant Hand: Right         Vision/Perception Vision - History Baseline Vision: No visual deficits Patient Visual Report: No change from baseline Vision - Assessment Eye Alignment: Within Functional Limits Vision Assessment: Vision not tested Perception Perception: Within Functional Limits Praxis Praxis: Intact   Cognition  Cognition Arousal/Alertness: Awake/alert Behavior During Therapy: WFL for tasks assessed/performed Overall Cognitive Status: Within Functional Limits for tasks assessed    Extremity/Trunk Assessment Upper Extremity Assessment Upper Extremity Assessment: Overall WFL for tasks assessed (did not formally assessed secondary to ? sternal precautions) Lower Extremity Assessment Lower Extremity Assessment: Defer to PT evaluation Cervical / Trunk Assessment Cervical / Trunk Assessment: Normal     Mobility Bed Mobility Bed Mobility: Supine to Sit;Sit to Supine;Sitting - Scoot to Edge of Bed Supine to Sit: 6: Modified independent (Device/Increase time);HOB flat Sitting - Scoot to Edge of Bed: 7: Independent Sit to Supine: 7: Independent;HOB flat Transfers Transfers: Sit to Stand Sit to Stand: 7: Independent;From toilet;Without upper extremity assist Stand to Sit: Without upper extremity assist;7: Independent        Balance Balance Balance Assessed: Yes Dynamic Sitting Balance Dynamic Sitting - Balance Support: No upper extremity supported;Feet supported Dynamic Sitting - Level of  Assistance: 7: Independent Static Standing Balance Static Standing - Balance Support: No upper extremity supported Static Standing - Level of Assistance: 7: Independent   End of Session OT - End of Session Activity Tolerance: Patient tolerated treatment well Patient left: in chair;with call bell/phone within reach Nurse Communication: Mobility status     Rocio Roam OTR/L 03/19/2013, 1:46 PM

## 2013-03-19 NOTE — Progress Notes (Signed)
Patient ambulated approximately 150 ft with RN. Steady on her feet.  HR remained stable.  She did become SOB at the end of the walk when returning to her room.  Checked O2 sats and she was 95% on 2L.  Lungs clear to auscultation.  Will continue to monitor.  Arva Chafe

## 2013-03-19 NOTE — Progress Notes (Signed)
CARDIAC REHAB PHASE I   PRE:  Rate/Rhythm: 91 SR  BP:  Supine:   Sitting: 86 dopplered  Standing:    SaO2: 92 RA  MODE:  Ambulation: 600 ft   POST:  Rate/Rhythm: 96 SR  BP:  Supine:   Sitting: 86 dopplered  Standing:    SaO2: 91 RA 1450-1535 On arrival pt on room air sat 92% Assisted X 1 and used w/c for pt to push to ambulate. Pt able to walk 600 feet with standing rest stops for every 150 feet. Checked room air sats each round 90-95%. Pt back to side of bed after walk room air sat 91%, O2 left off. Pt is DOE but denies it. Call light in reach. Reported RA sats to nurse.  Melina Copa RN 03/19/2013 3:35 PM

## 2013-03-19 NOTE — Telephone Encounter (Signed)
CSW received request from Hessie Diener, RN VAD to follow up with daughter regarding insurance issues. CSW spoke with daughter Sherry Ruffing via phone. Daughter reports that she has a meeting this afternoon with Medicaid specialist to review eligibility and application process. Daughter reports Pt. began in 8/14 receiving Medicare benefits but unsure of Social Security income. Daughter also reported plans to complete Living Will and financial POA later this week with Pt. and notary. CSW offered support and encouraged daughter to return call if any further questions or concerns arise. CSW will be available through HF clinic as needed. Lasandra Beech, LCSW

## 2013-03-19 NOTE — Evaluation (Signed)
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 EF 15-25%.Underwent HM II LVAD implant (under destination criteria) along with TV repair on 8/18. Adm with incr weakness, dyspnea, ?pneumonia vs empyema and ? drive line infection   Clinical Impression  Pt admitted with ?pneumonia/empyema vs driveline infection. Pt adamant about returning home alone with her daughters checking in on her. She demonstrated excellent safety awareness and ability to self-monitor her exercise tolerance throughout the session. She is currently at a modified independent level with use of a RW, however her goal is to get back to walking without a device (as she did PTA). Pt currently with very minimal functional limitations due to the deficits listed below (see PT Problem List). Pt will benefit from skilled PT to increase her independence and safety with mobility to allow discharge home.     PT Assessment  Patient needs continued acute PT services    Follow Up Recommendations  No PT follow up    Does the patient have the potential to tolerate intense rehabilitation      Barriers to Discharge Decreased caregiver support pt reports she has been living alone and her daughters check-in on her; from a functional standpoint, feel pt can mobilize safely to be alone, however medically she may need assist/supervision (going home on IV meds and ?home O2)    Equipment Recommendations  None recommended by PT    Recommendations for Other Services     Frequency Min 3X/week    Precautions / Restrictions Precautions Precautions: Other (comment) Precaution Comments: LVAD Restrictions Weight Bearing Restrictions: No    Pertinent Vitals/Pain HR 88-106  SaO2 97% on 2L at rest and with walking; on RA with walking decr to 90% with ~2 minutes seated rest to incr to 93%; and then decr to 90% at rest and resumed 1L02 with SaO2 97%       Mobility  Bed Mobility Bed Mobility: Supine to Sit;Sit to Supine;Sitting - Scoot to Edge of Bed Supine to Sit: 6: Modified independent (Device/Increase time);HOB flat Sitting - Scoot to Edge of Bed: 7: Independent Sit to Supine: 7: Independent;HOB flat Transfers Transfers: Sit to Stand;Stand to Sit Sit to Stand: 7: Independent;From bed;From toilet Stand to Sit: 7: Independent;To bed;To chair/3-in-1;To toilet Ambulation/Gait Ambulation/Gait Assistance: 6: Modified independent (Device/Increase time) Ambulation Distance (Feet): 450 Feet Assistive device: Rolling walker;None Ambulation/Gait Assistance Details: requires assist for IV, portable O2, and emergency bag/equipment; pt able to pace herself appropriately (self-selected standing rest breaks x 4 during walk); pt did not feel comfortable without RW due to DOE and feeling slightly "weak" Gait Pattern: Within Functional Limits Gait velocity: decr due to DOE Stairs: No    Exercises     PT Diagnosis: Difficulty walking  PT Problem List: Decreased activity tolerance;Decreased balance;Cardiopulmonary status limiting activity PT Treatment Interventions: DME instruction;Gait training;Stair training;Functional mobility training;Therapeutic activities;Balance training;Patient/family education     PT Goals(Current goals can be found in the care plan section) Acute Rehab PT Goals Patient Stated Goal: go home, "I'm not going anywhere else" PT Goal Formulation: With patient Time For Goal Achievement: 03/26/13 Potential to Achieve Goals: Good  Visit Information  Last PT Received On: 03/19/13 Assistance Needed: +1 History of Present Illness: Ms.  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 EF 15-25%.Underwent HM II LVAD implant (under destination criteria) along with TV repair on 8/18. Adm with incr weakness, dyspnea, ?pneumonia vs empyema and ? drive line infection       Prior Functioning  Home Living Family/patient expects to be discharged to:: Private residence Living Arrangements: Alone Available Help at Discharge: Family;Available PRN/intermittently Type of Home: House Home Access: Stairs to enter [employer] of Steps: 5 Entrance Stairs-Rails: Right;Left;Can reach both Home Layout: One level Home Equipment: Walker - 4 wheels Prior Function Level of Independence: Independent Comments: reports she usually walks without her rollator; if she leaves the house, someone is with her and carries her emergency bag for her Communication Communication: No difficulties Dominant Hand: Right    Cognition  Cognition Arousal/Alertness: Awake/alert Behavior During Therapy: WFL for tasks assessed/performed Overall Cognitive Status: Within Functional Limits for tasks assessed    Extremity/Trunk Assessment Upper Extremity Assessment Upper Extremity Assessment: Defer to OT evaluation Lower Extremity Assessment Lower Extremity Assessment: Overall WFL for tasks assessed Cervical / Trunk Assessment Cervical / Trunk Assessment: Normal   Balance Balance Balance Assessed: Yes Dynamic Sitting Balance Dynamic Sitting - Balance Support: No upper extremity supported;Feet supported Dynamic Sitting - Level of Assistance: 7: Independent Static Standing Balance Static Standing - Balance Support: No upper extremity supported Static Standing - Level of Assistance: 7: Independent  End of Session PT - End of Session Equipment Utilized During Treatment: Oxygen Activity Tolerance: Patient limited by fatigue Patient left: in chair;with call bell/phone within reach Nurse Communication: Mobility status;Other (comment) (left on 1L O2 with SaO2 97%)  GP      Kaitlin [name] 03/19/2013, 10:50 AM Pager [phone]

## 2013-03-19 NOTE — Progress Notes (Signed)
ANTICOAGULATION CONSULT NOTE - Follow Up Consult  Pharmacy Consult for coumadin Indication: LVAD  No Known Allergies  Patient Measurements: Height: 5\' 3"  (160 cm) Weight: 153 lb 11.2 oz (69.718 kg) IBW/kg (Calculated) : 52.4  Vital Signs: Temp: 97.9 F (36.6 C) (11/17 0518) Temp src: Oral (11/17 0518) Pulse Rate: 91 (11/17 0518)  Labs:  Recent Labs  03/17/13 0518 03/18/13 0600 03/19/13 0527  HGB 9.1* 9.0* 8.8*  HCT 28.2* 27.7* 26.3*  PLT 260 271 276  LABPROT 24.2* 23.0* 23.9*  INR 2.26* 2.11* 2.22*  CREATININE 1.38* 1.28* 1.42*    Estimated Creatinine Clearance: 37 ml/min (by C-G formula based on Cr of 1.42).  Assessment: Patient is a 67 y.o F currently on coumadin for VAD.  INR remains therapeutic at 2.2.  No bleeding documented.  Goal of Therapy:  INR 2-3 Monitor platelets by anticoagulation protocol: Yes   Plan:  1) coumadin 2.5mg  PO x1 today  Sheppard Coil PharmD., BCPS Clinical Pharmacist Pager [phone] 03/19/2013 8:55 AM

## 2013-03-19 NOTE — Progress Notes (Signed)
Pt given 0.5mg  PO Xanax at this time per pt request; will cont. To monitor.

## 2013-03-19 NOTE — Progress Notes (Signed)
Thank you for consult on Kaitlin [name]. She is modified independent with a walker and no follow up therapies recommended. Will defer CIR consult for now.

## 2013-03-20 LAB — CARBOXYHEMOGLOBIN
Carboxyhemoglobin: 1.2 % (ref 0.5–1.5)
Methemoglobin: 1 % (ref 0.0–1.5)
O2 Saturation: 49.3 %
Total hemoglobin: 8.8 g/dL — ABNORMAL LOW (ref 12.0–16.0)

## 2013-03-20 LAB — CBC WITH DIFFERENTIAL/PLATELET
Basophils Absolute: 0 10*3/uL (ref 0.0–0.1)
Basophils Relative: 0 % (ref 0–1)
Eosinophils Absolute: 0.1 10*3/uL (ref 0.0–0.7)
HCT: 26.7 % — ABNORMAL LOW (ref 36.0–46.0)
Lymphocytes Relative: 9 % — ABNORMAL LOW (ref 12–46)
MCHC: 33.3 g/dL (ref 30.0–36.0)
Monocytes Absolute: 0.8 10*3/uL (ref 0.1–1.0)
Neutro Abs: 7.3 10*3/uL (ref 1.7–7.7)
Neutrophils Relative %: 80 % — ABNORMAL HIGH (ref 43–77)
Platelets: 278 10*3/uL (ref 150–400)
RDW: 17.5 % — ABNORMAL HIGH (ref 11.5–15.5)
WBC: 9 10*3/uL (ref 4.0–10.5)

## 2013-03-20 LAB — PROTIME-INR
INR: 2.17 — ABNORMAL HIGH (ref 0.00–1.49)
Prothrombin Time: 23.5 seconds — ABNORMAL HIGH (ref 11.6–15.2)

## 2013-03-20 LAB — BASIC METABOLIC PANEL
BUN: 28 mg/dL — ABNORMAL HIGH (ref 6–23)
Chloride: 98 mEq/L (ref 96–112)
Creatinine, Ser: 1.47 mg/dL — ABNORMAL HIGH (ref 0.50–1.10)
GFR calc Af Amer: 42 mL/min — ABNORMAL LOW (ref >90)
GFR calc non Af Amer: 36 mL/min — ABNORMAL LOW (ref >90)
Sodium: 134 mEq/L — ABNORMAL LOW (ref 135–145)

## 2013-03-20 MED ORDER — WARFARIN SODIUM 2.5 MG PO TABS
2.5000 mg | ORAL_TABLET | Freq: Once | ORAL | Status: AC
  Administered 2013-03-20: 2.5 mg via ORAL
  Filled 2013-03-20: qty 1

## 2013-03-20 MED ORDER — FUROSEMIDE 10 MG/ML IJ SOLN
80.0000 mg | Freq: Two times a day (BID) | INTRAMUSCULAR | Status: DC
  Administered 2013-03-20: 80 mg via INTRAVENOUS
  Filled 2013-03-20 (×3): qty 8

## 2013-03-20 MED ORDER — FUROSEMIDE 10 MG/ML IJ SOLN
80.0000 mg | Freq: Once | INTRAMUSCULAR | Status: AC
  Administered 2013-03-20: 80 mg via INTRAVENOUS
  Filled 2013-03-20: qty 8

## 2013-03-20 MED ORDER — POTASSIUM CHLORIDE CRYS ER 10 MEQ PO TBCR
40.0000 meq | EXTENDED_RELEASE_TABLET | Freq: Once | ORAL | Status: AC
  Administered 2013-03-20: 40 meq via ORAL
  Filled 2013-03-20: qty 4

## 2013-03-20 NOTE — Progress Notes (Addendum)
ANTICOAGULATION CONSULT NOTE   Pharmacy Consult for coumadin Indication: LVAD  No Known Allergies  Patient Measurements: Height: 5\' 3"  (160 cm) Weight: 157 lb 13.6 oz (71.6 kg) IBW/kg (Calculated) : 52.4  Vital Signs: Temp: 98.1 F (36.7 C) (11/18 0613) BP: 84/0 mmHg (11/17 2202)  Labs:  Recent Labs  03/18/13 0600 03/19/13 0527 03/20/13 0600  HGB 9.0* 8.8* 8.9*  HCT 27.7* 26.3* 26.7*  PLT 271 276 278  LABPROT 23.0* 23.9* 23.5*  INR 2.11* 2.22* 2.17*  CREATININE 1.28* 1.42* 1.47*    Estimated Creatinine Clearance: 36.2 ml/min (by C-G formula based on Cr of 1.47).  Assessment: Patient is a 67 y.o F currently on coumadin for VAD.  INR remains therapeutic at 2.17. No bleeding documented.  Her home dose was 2.5mg  daily except 5mg  on M/W/F.  Goal of Therapy:  INR 2-3 Monitor platelets by anticoagulation protocol: Yes   Plan:  1) coumadin 2.5mg  PO x1 today  Sheppard Coil PharmD., BCPS Clinical Pharmacist Pager [phone] 03/20/2013 8:06 AM

## 2013-03-20 NOTE — Progress Notes (Signed)
Pt not wanting to complete third walk at this time; pt assisted to bed; will cont. To monitor.

## 2013-03-20 NOTE — Progress Notes (Signed)
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 EF 15-25%.Underwent HM II LVAD implant (under destination criteria) along with TV repair on 8/18. Adm with incr weakness, dyspnea, ?pneumonia vs empyema and ? drive line infection   PT Comments   Pt continues to have limited activity tolerance and dyspnea with activity.  Follow Up Recommendations  No PT follow up     Does the patient have the potential to tolerate intense rehabilitation     Barriers to Discharge        Equipment Recommendations  None recommended by PT    Recommendations for Other Services    Frequency Min 3X/week   Progress towards PT Goals Progress towards PT goals: Progressing toward goals  Plan Current plan remains appropriate    Precautions / Restrictions Precautions Precautions: Other (comment) Precaution Comments: LVAD   Pertinent Vitals/Pain VSS    Mobility  Transfers Sit to Stand: 6: Modified independent (Device/Increase time);Without upper extremity assist;From chair/3-in-1 Stand to Sit: 6: Modified independent (Device/Increase time);Without upper extremity assist;To chair/3-in-1 Ambulation/Gait Ambulation/Gait Assistance: 6: Modified independent (Device/Increase time) Ambulation Distance (Feet): 450 Feet Assistive device: Rolling walker Ambulation/Gait Assistance Details: Assist for IV and emergency equipment bag.  Pt required 3 standing rest breaks of 2-4 minutes. Gait Pattern: Within Functional Limits Gait velocity: decr due to DOE General Gait Details: Dyspnea 2-3/4 with amb    Exercises     PT Diagnosis:    PT Problem List:   PT Treatment Interventions:     PT Goals (current goals can now  be found in the care plan section)    Visit Information  Last PT Received On: 03/20/13 Assistance Needed: +1 History of Present Illness: Kaitlin [name] is a 67 y/o woman with h/o severe HTN, [name], VT s/p Biotronik ICD (2009) and 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 EF 15-25%.Underwent HM II LVAD implant (under destination criteria) along with TV repair on 8/18. Adm with incr weakness, dyspnea, ?pneumonia vs empyema and ? drive line infection    Subjective Data      Cognition  Cognition Arousal/Alertness: Awake/alert Behavior During Therapy: WFL for tasks assessed/performed Overall Cognitive Status: Within Functional Limits for tasks assessed    Balance  Static Standing Balance Static Standing - Balance Support: No upper extremity supported Static Standing - Level of Assistance: 7: Independent  End of Session PT - End of Session Activity Tolerance: Patient limited by fatigue Patient left: in chair;with call bell/phone within reach;with family/visitor present Nurse Communication: Mobility status   GP     Kaitlin [name] 03/20/2013, 4:14 PM  [employer] PT [phone]

## 2013-03-20 NOTE — Progress Notes (Signed)
CARDIAC REHAB PHASE I   PRE:  Rate/Rhythm: 84SR  BP:  Supine:   Sitting: 82 dopplered  Standing:    SaO2: 94%RA  MODE:  Ambulation: 300 ft   POST:  Rate/Rhythm: 100  BP:  Supine:   Sitting: 90 dopplered  Standing:    SaO2: 93%RA 1032-1115 Pt seems weak this morning. She is disappointed that she is not going home. Stopped several times to rest due to feeling SOB. Checked sats each stop and at 93-94%RA. Pt shakey during walk. Told pt we could cut walk short as it seemed to be very hard for her. Exhausted by end of walk. To recliner with call bell. Pt connected back to power source.   Luetta Nutting, RN BSN  03/20/2013 11:09 AM

## 2013-03-20 NOTE — Progress Notes (Signed)
Pt given 0.5mg PO Xanax at this time per pt request; will cont. To monitor. 

## 2013-03-20 NOTE — Progress Notes (Signed)
NUTRITION FOLLOW UP  Intervention:    Continue Resource Breeze twice daily (250 kcals, 9 gm protein per 8 fl oz carton) RD to follow for nutrition care plan  Nutrition Dx:   Increased nutrient needs related to acute illness, infection as evidenced by estimated nutrition needs, ongoing  Goal:   Pt to meet >/= 90% of their estimated nutrition needs, progressing  Monitor:   PO & supplemental intake, weight, labs, I/O's  Assessment:   Patient with PMH of severe HTN and CHF; s/p HM II LVAD implantation 12/18/12; admitted 10/12--10/14 for fatigue and concern for low output due to RHF; admitted for possible empyema / LVAD infection.   Patient s/p procedure 11/6:  IR US GUIDE VASC ACCESS RIGHT   Patient s/p procedure 11/10:  CARDIAC CATHETERIZATION  Patient transferred to 2W-Cardiac 11/15.  PO intake variable at 50-75% per flowsheet records.  Receiving Resource Breeze supplement twice daily.  Hopefully home tomorrow.  Height: Ht Readings from Last 1 Encounters:  03/19/13 5\' 3"  (1.6 m)    Weight Status:   Wt Readings from Last 1 Encounters:  03/20/13 159 lb 6.3 oz (72.3 kg)    Re-estimated needs:  Kcal: 1700-1900 Protein: 90-100 gm Fluid: 1.7-1.9 L  Skin: abdominal surgical incision   Diet Order: Cardiac   Intake/Output Summary (Last 24 hours) at 03/20/13 1337 Last data filed at 03/20/13 1217  Gross per 24 hour  Intake 1193.9 ml  Output    851 ml  Net  342.9 ml    Labs:   Recent Labs Lab 03/18/13 0600 03/19/13 0527 03/20/13 0600  NA 134* 134* 134*  K 3.6 3.7 3.4*  CL 101 99 98  CO2 23 23 22   BUN 20 24* 28*  CREATININE 1.28* 1.42* 1.47*  CALCIUM 9.7 9.8 10.0  GLUCOSE 98 107* 105*    Scheduled Meds: . amiodarone  100 mg Oral Daily  . amLODipine  10 mg Oral Daily  . aspirin EC  325 mg Oral Daily  . citalopram  20 mg Oral Daily  . digoxin  0.0625 mg Oral Daily  . feeding supplement (RESOURCE BREEZE)  1 Container Oral BID  . ferrous  fumarate-b12-vitamic C-folic acid  1 capsule Oral TID PC  . furosemide  80 mg Intravenous BID  . hydrALAZINE  25 mg Oral Q8H  . pantoprazole  40 mg Oral Daily  . potassium chloride  10 mEq Oral BID  . sildenafil  40 mg Oral TID  . simvastatin  10 mg Oral QHS  . sodium chloride  10-40 mL Intracatheter Q12H  . sodium chloride  3 mL Intravenous Q12H  . spironolactone  25 mg Oral Daily  . warfarin  2.5 mg Oral ONCE-1800  . Warfarin - Pharmacist Dosing Inpatient   Does not apply q1800    Continuous Infusions: . sodium chloride 15 mL/hr (03/20/13 0250)  . DOBUTamine 5 mcg/kg/min (03/20/13 0251)    Maureen Chatters, RD, LDN Pager #: [phone] After-Hours Pager #: [phone]

## 2013-03-20 NOTE — Progress Notes (Signed)
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 Post-op couse c/b RHF requiring take back to the OR for bleeding and temproray RVAD support.   Admitted 11/6 with nausea. Echo 03/09/13 Unable to see valves well. RV markedly dilated and severely hypokinetic. Septal flattening c/w RV pressure volume overload.   TEE 03/12/13 showed significant RV dysfunction. no vegetation on valves or ICD wires. RHC 03/12/13 d/t worsening RHF. Started on milrinone 0.25. Changed to dobutamine gtt. Now at 32mcg/min  03/18/13 with occasional CHB and bradycardia -->back up rate adjusted to 70 bpm during the day and 60 bpm in pm.    Weight up 6 pounds. Denies SOB/Orthopnea.   Co-ox 72%>49%>51% which was the repeat.   MAP 80-90s Creatinine 1.2 >1.4> 1.47.     VAD interrogation  RPM 8600 Flow 4.3L Power 4.5 PI 6.0 Rare PI events    Objective:    Vital Signs:   Temp:  [97.3 F (36.3 C)-98.1 F (36.7 C)] 98.1 F (36.7 C) (11/18 0613) Resp:  [18] 18 (11/17 1414) BP: (84)/(0) 84/0 mmHg (11/17 2202) SpO2:  [90 %-92 %] 92 % (11/17 1414) Weight:  [157 lb 13.6 oz (71.6 kg)] 157 lb 13.6 oz (71.6 kg) (11/18 0613) Last BM Date: 03/19/13 Filed Weights   03/18/13 0625 03/19/13 0600 03/20/13 0613  Weight: 155 lb 9.6 oz (70.58 kg) 153 lb 11.2 oz (69.718 kg) 157 lb 13.6 oz (71.6 kg)    Physical Exam: GENERAL: no acute distress; lying in bed HEENT: normal  NECK: Supple, JVP to jaw . 2+ bilaterally, no bruits. No lymphadenopathy or thyromegaly appreciated.  CARDIAC: Mechanical heart sounds with LVAD hum present.  LUNGS: Minimal crackles at the bases. ABDOMEN: Soft, round, tender around rib cage, positive bowel sounds x4.  LVAD exit site: well-healed and  incorporated. Dressing dry and intact. No erythema or drainage. Stabilization device present and accurately applied. No tenderness to palpation at all along driveline tract.  EXTREMITIES:  no cyanosis, clubbing, rash. R and LLE 1-2+ edema; RUE 2 PICC NEUROLOGIC: Alert and oriented x 4. Gait steady. No aphasia. No dysarthria. Affect pleasant.     Labs: Basic Metabolic Panel:  Recent Labs Lab 03/16/13 0500 03/17/13 0518 03/18/13 0600 03/19/13 0527 03/20/13 0600  NA 138 133* 134* 134* 134*  K 3.9 3.9 3.6 3.7 3.4*  CL 102 100 101 99 98  CO2 23 22 23 23 22   GLUCOSE 92 103* 98 107* 105*  BUN 17 19 20  24* 28*  CREATININE 1.24* 1.38* 1.28* 1.42* 1.47*  CALCIUM 9.3 9.8 9.7 9.8 10.0    Liver Function Tests: No results found for this basename: AST, ALT, ALKPHOS, BILITOT, PROT, ALBUMIN,  in the last 168 hours No results found for this basename: LIPASE, AMYLASE,  in the last 168 hours No results found for this basename: AMMONIA,  in the last 168 hours  CBC:  Recent Labs Lab 03/16/13 0500 03/17/13 0518 03/18/13 0600 03/19/13 0527 03/20/13 0600  WBC 8.0 8.4 8.4 9.1 9.0  NEUTROABS 6.3 6.6 6.6 7.4 7.3  HGB 9.3* 9.1* 9.0* 8.8* 8.9*  HCT 27.9* 28.2* 27.7* 26.3* 26.7*  MCV 85.3 87.0 86.8 85.1 84.8  PLT 255 260 271 276 278  Cardiac Enzymes: No results found for this basename: CKTOTAL, CKMB, CKMBINDEX, TROPONINI,  in the last 168 hours  BNP: BNP (last 3 results)  Recent Labs  02/28/13 1423 03/07/13 1307 03/08/13 2155  PROBNP 2290.0* 1875.0* 2459.0*    CBG: No results found for this basename: GLUCAP,  in the last 168 hours  Coagulation Studies:  Recent Labs  03/18/13 0600 03/19/13 0527 03/20/13 0600  LABPROT 23.0* 23.9* 23.5*  INR 2.11* 2.22* 2.17*    Imaging: Dg Chest 2 View  03/19/2013   CLINICAL DATA:  Short of breath. Status post left ventricular assist device.  EXAM: CHEST  2 VIEW  COMPARISON:  03/13/2013  FINDINGS: Lung base opacity and mid and lower  lung zones coarse reticular and discoid opacities reflect combination of atelectasis and small effusions. No pulmonary edema. No pneumothorax. Opacity at the left lung base appears mildly improved although this apparent change is most likely technical. No new lung opacities.  Left ventricular assist device is stable. Stable left anterior chest wall ICD and right sided PICC.  Cardiac silhouette is enlarged.  No mediastinal widening.  IMPRESSION: 1. Left lung opacity appears improved, but this is most likely due to differences in technique and patient positioning. 2. Persistent areas of lung atelectasis. Probable small effusions. No pulmonary edema or pneumothorax. 3. Support apparatus is stable in well positioned.   Electronically Signed   By: Amie Portland M.D.   On: 03/19/2013 07:36    Current Facility-Administered Medications  Medication Dose Route Frequency Provider Last Rate Last Dose  . 0.9 %  sodium chloride infusion  250 mL Intravenous PRN Dolores Patty, MD      . 0.9 %  sodium chloride infusion   Intravenous Continuous Dolores Patty, MD 15 mL/hr at 03/20/13 0250 15 mL/hr at 03/20/13 0250  . acetaminophen (TYLENOL) tablet 650 mg  650 mg Oral Q4H PRN Dolores Patty, MD   650 mg at 03/19/13 2201  . ALPRAZolam Prudy Feeler) tablet 0.5 mg  0.5 mg Oral QID PRN Dolores Patty, MD   0.5 mg at 03/19/13 2201  . amiodarone (PACERONE) tablet 100 mg  100 mg Oral Daily Aundria Rud, NP   100 mg at 03/19/13 1051  . amLODipine (NORVASC) tablet 10 mg  10 mg Oral Daily Aundria Rud, NP   10 mg at 03/19/13 1051  . aspirin EC tablet 325 mg  325 mg Oral Daily Aundria Rud, NP   325 mg at 03/19/13 1051  . citalopram (CELEXA) tablet 20 mg  20 mg Oral Daily Aundria Rud, NP   20 mg at 03/19/13 1051  . clonazePAM (KLONOPIN) tablet 0.5 mg  0.5 mg Oral BID Dolores Patty, MD   0.5 mg at 03/18/13 2129  . digoxin (LANOXIN) tablet 0.0625 mg  0.0625 mg Oral Daily Aundria Rud, NP   0.0625 mg at  03/19/13 1051  . DOBUTamine (DOBUTREX) infusion 4000 mcg/mL  5 mcg/kg/min Intravenous Titrated Dolores Patty, MD 5.3 mL/hr at 03/20/13 0251 5 mcg/kg/min at 03/20/13 0251  . feeding supplement (RESOURCE BREEZE) (RESOURCE BREEZE) liquid 1 Container  1 Container Oral BID Ailene Ards, RD   1 Container at 03/19/13 2200  . ferrous fumarate-b12-vitamic C-folic acid (TRINSICON / FOLTRIN) capsule 1 capsule  1 capsule Oral TID PC Aundria Rud, NP   1 capsule at 03/19/13 1704  . hydrALAZINE (APRESOLINE) tablet 25 mg  25 mg Oral Q8H Aundria Rud, NP   25 mg  at 03/20/13 0605  . ondansetron (ZOFRAN) injection 4 mg  4 mg Intravenous Q6H PRN Dolores Patty, MD      . pantoprazole (PROTONIX) EC tablet 40 mg  40 mg Oral Daily Aundria Rud, NP   40 mg at 03/19/13 1051  . potassium chloride (K-DUR,KLOR-CON) CR tablet 10 mEq  10 mEq Oral BID Aundria Rud, NP   10 mEq at 03/19/13 2143  . sildenafil (REVATIO) tablet 40 mg  40 mg Oral TID Dolores Patty, MD   40 mg at 03/19/13 2144  . simvastatin (ZOCOR) tablet 10 mg  10 mg Oral QHS Aundria Rud, NP   10 mg at 03/19/13 1703  . sodium chloride 0.9 % injection 10-40 mL  10-40 mL Intracatheter Q12H Dolores Patty, MD   10 mL at 03/17/13 1105  . sodium chloride 0.9 % injection 10-40 mL  10-40 mL Intracatheter PRN Dolores Patty, MD      . sodium chloride 0.9 % injection 10-40 mL  10-40 mL Intracatheter PRN Dolores Patty, MD   10 mL at 03/19/13 0520  . sodium chloride 0.9 % injection 3 mL  3 mL Intravenous Q12H Dolores Patty, MD   3 mL at 03/18/13 2131  . sodium chloride 0.9 % injection 3 mL  3 mL Intravenous PRN Dolores Patty, MD      . spironolactone (ALDACTONE) tablet 25 mg  25 mg Oral Daily Aundria Rud, NP   25 mg at 03/19/13 1051  . torsemide (DEMADEX) tablet 20 mg  20 mg Oral Daily Dolores Patty, MD      . Warfarin - Pharmacist Dosing Inpatient   Does not apply q1800 Severiano Gilbert, [facility]      . zolpidem  (AMBIEN) tablet 5 mg  5 mg Oral QHS PRN Aundria Rud, NP         Assessment:   1. Dypnea 2. Chronic biventricular HF Status post HM-II LVAD implantation.  3. PAF  4. Recent VT s/p ICD shock on amio 5. R pleural effusion  6. Transient CHB 7. Chronic renal failure, stage III 8. Recurrent RHF, on dobutamine 9. Anxiety 10. Physical deconditioning 11. Hypokalemia  Plan/Discussion:   CO-OX this am 49%  With repeat 51%. Continue dobutamine at 5 mcg. Continue 40 mg sildenafil tid. Weight up 6 pounds. Start lasix 80 mg IV bid. Supplement potassium. Add ted hose.   INR 2.17. Pharmacy dosing coumadin.   [facility] to follow for home dobutamine.   VAD parameters look ok.   Hopefully home tomorrow if volume status improves.   [name],[name],NP-C 8:03 AM   Patient seen and examined with Tonye Becket, NP. We discussed all aspects of the encounter. I agree with the assessment and plan as stated above.   She is feeling a bit better but volume status up and co-ox is down. Will give IV lasix today. Continue dobutamine. Overall she is fairly tenuous but need to focus on getting her stringer for transplant evaluation at [facility]. She is eager to go home. Deemed not candidate for CIR.   VAD interrogation looks ok. Possibly home in am.  [name] 12:03 PM

## 2013-03-21 ENCOUNTER — Telehealth: Payer: Self-pay | Admitting: Licensed Clinical Social Worker

## 2013-03-21 LAB — CBC WITH DIFFERENTIAL/PLATELET
Basophils Absolute: 0 10*3/uL (ref 0.0–0.1)
Basophils Relative: 0 % (ref 0–1)
HCT: 27.2 % — ABNORMAL LOW (ref 36.0–46.0)
MCH: 27.9 pg (ref 26.0–34.0)
MCHC: 32.7 g/dL (ref 30.0–36.0)
Monocytes Absolute: 0.6 10*3/uL (ref 0.1–1.0)
Neutro Abs: 7.1 10*3/uL (ref 1.7–7.7)
Platelets: 308 10*3/uL (ref 150–400)
RBC: 3.19 MIL/uL — ABNORMAL LOW (ref 3.87–5.11)
RDW: 17.7 % — ABNORMAL HIGH (ref 11.5–15.5)

## 2013-03-21 LAB — BASIC METABOLIC PANEL
BUN: 26 mg/dL — ABNORMAL HIGH (ref 6–23)
CO2: 22 mEq/L (ref 19–32)
CO2: 22 mEq/L (ref 19–32)
Chloride: 97 mEq/L (ref 96–112)
Creatinine, Ser: 1.44 mg/dL — ABNORMAL HIGH (ref 0.50–1.10)
GFR calc Af Amer: 43 mL/min — ABNORMAL LOW (ref >90)
Glucose, Bld: 95 mg/dL (ref 70–99)
Glucose, Bld: 142 mg/dL — ABNORMAL HIGH (ref 70–99)
Potassium: 3.4 mEq/L — ABNORMAL LOW (ref 3.5–5.1)
Potassium: 3.6 mEq/L (ref 3.5–5.1)
Sodium: 134 mEq/L — ABNORMAL LOW (ref 135–145)

## 2013-03-21 LAB — PROTIME-INR: Prothrombin Time: 20.8 seconds — ABNORMAL HIGH (ref 11.6–15.2)

## 2013-03-21 LAB — CARBOXYHEMOGLOBIN
Carboxyhemoglobin: 1.4 % (ref 0.5–1.5)
Carboxyhemoglobin: 1.5 % (ref 0.5–1.5)
Methemoglobin: 2 % — ABNORMAL HIGH (ref 0.0–1.5)
Total hemoglobin: 11.3 g/dL — ABNORMAL LOW (ref 12.0–16.0)

## 2013-03-21 MED ORDER — POTASSIUM CHLORIDE CRYS ER 10 MEQ PO TBCR
40.0000 meq | EXTENDED_RELEASE_TABLET | Freq: Two times a day (BID) | ORAL | Status: DC
  Administered 2013-03-21 (×2): 40 meq via ORAL
  Filled 2013-03-21 (×4): qty 4

## 2013-03-21 MED ORDER — FUROSEMIDE 10 MG/ML IJ SOLN
5.0000 mg/h | INTRAVENOUS | Status: DC
  Administered 2013-03-21 – 2013-03-22 (×2): 10 mg/h via INTRAVENOUS
  Administered 2013-03-23: 5 mg/h via INTRAVENOUS
  Filled 2013-03-21 (×5): qty 25

## 2013-03-21 MED ORDER — METOLAZONE 2.5 MG PO TABS
2.5000 mg | ORAL_TABLET | Freq: Once | ORAL | Status: AC
  Administered 2013-03-21: 2.5 mg via ORAL
  Filled 2013-03-21: qty 1

## 2013-03-21 MED ORDER — WARFARIN SODIUM 6 MG PO TABS
6.0000 mg | ORAL_TABLET | Freq: Once | ORAL | Status: AC
  Administered 2013-03-21: 6 mg via ORAL
  Filled 2013-03-21: qty 1

## 2013-03-21 MED ORDER — FUROSEMIDE 10 MG/ML IJ SOLN
80.0000 mg | Freq: Once | INTRAMUSCULAR | Status: AC
  Administered 2013-03-21: 80 mg via INTRAVENOUS
  Filled 2013-03-21: qty 8

## 2013-03-21 NOTE — Progress Notes (Signed)
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 Post-op couse c/b RHF requiring take back to the OR for bleeding and temproray RVAD support.   Admitted 11/6 with nausea. Echo 03/09/13 Unable to see valves well. RV markedly dilated and severely hypokinetic. Septal flattening c/w RV pressure volume overload.   TEE 03/12/13 showed significant RV dysfunction. no vegetation on valves or ICD wires. RHC 03/12/13 d/t worsening RHF. Started on milrinone 0.25. Changed to dobutamine gtt. Now at 61mcg/min  RHC:  RA = 20  RV = 47/12/19  PA = 49/17 (32)  PCW = 19  Fick cardiac output/index = 4.6/2.7 (using calculated O2 consumption of 228 ml/min)  Fick CO/CI = 3.5/2.1 (uisng measured O2 consumption with metabolic cart 181 ml/min)  Thermo CO/CI = 2.6/1.5  PVR =3.4 WU  PA sat = 59%, 63%  Ao sat 95%  03/18/13 with occasional CHB and bradycardia -->back up rate adjusted to 70 bpm during the day and 60 bpm in pm.   Weight up another pound. Denies SOB/Orthopnea.   Co-ox 72%>49%>51%>43%  MAP 70-80s Creatinine 1.2 >1.4> 1.47>1.3.    VAD interrogation  RPM 8600 Flow 4.4L Power 4.5 PI 5.2 had 3  PI events  Objective:    Vital Signs:   Temp:  [97.2 F (36.2 C)-98.3 F (36.8 C)] 98.1 F (36.7 C) (11/19 8295) Pulse Rate:  [82] 82 (11/19 0621) Resp:  [16-18] 18 (11/19 0621) BP: (82)/(0) 82/0 mmHg (11/19 0620) SpO2:  [93 %] 93 % (11/19 0621) Weight:  [159 lb 6.3 oz (72.3 kg)-160 lb 7.9 oz (72.8 kg)] 160 lb 7.9 oz (72.8 kg) (11/19 0621) Last BM Date: 03/19/13 Filed Weights   03/20/13 0613 03/20/13 0846 03/21/13 0621  Weight: 157 lb 13.6 oz (71.6 kg) 159 lb 6.3 oz (72.3 kg) 160 lb 7.9 oz (72.8 kg)    Physical Exam: GENERAL: no acute distress;  lying in bed HEENT: normal  NECK: Supple, JVP to jaw . 2+ bilaterally, no bruits. No lymphadenopathy or thyromegaly appreciated.  CARDIAC: Mechanical heart sounds with LVAD hum present.  LUNGS: Minimal crackles at the bases. ABDOMEN: Soft, round, tender around rib cage, positive bowel sounds x4.  LVAD exit site: well-healed and incorporated. Dressing dry and intact. No erythema or drainage. Stabilization device present and accurately applied. No tenderness to palpation at all along driveline tract.  EXTREMITIES:  no cyanosis, clubbing, rash. R and LLE 1-2+ edema; RUE 2 PICC NEUROLOGIC: Alert and oriented x 4. Gait steady. No aphasia. No dysarthria. Affect pleasant.     Labs: Basic Metabolic Panel:  Recent Labs Lab 03/16/13 0500 03/17/13 0518 03/18/13 0600 03/19/13 0527 03/20/13 0600  NA 138 133* 134* 134* 134*  K 3.9 3.9 3.6 3.7 3.4*  CL 102 100 101 99 98  CO2 23 22 23 23 22   GLUCOSE 92 103* 98 107* 105*  BUN 17 19 20  24* 28*  CREATININE 1.24* 1.38* 1.28* 1.42* 1.47*  CALCIUM 9.3 9.8 9.7 9.8 10.0    Liver Function Tests: No results found for this basename: AST, ALT, ALKPHOS, BILITOT, PROT, ALBUMIN,  in the last 168 hours No results found for this basename: LIPASE, AMYLASE,  in the last 168 hours No results found  for this basename: AMMONIA,  in the last 168 hours  CBC:  Recent Labs Lab 03/16/13 0500 03/17/13 0518 03/18/13 0600 03/19/13 0527 03/20/13 0600  WBC 8.0 8.4 8.4 9.1 9.0  NEUTROABS 6.3 6.6 6.6 7.4 7.3  HGB 9.3* 9.1* 9.0* 8.8* 8.9*  HCT 27.9* 28.2* 27.7* 26.3* 26.7*  MCV 85.3 87.0 86.8 85.1 84.8  PLT 255 260 271 276 278    Cardiac Enzymes: No results found for this basename: CKTOTAL, CKMB, CKMBINDEX, TROPONINI,  in the last 168 hours  BNP: BNP (last 3 results)  Recent Labs  02/28/13 1423 03/07/13 1307 03/08/13 2155  PROBNP 2290.0* 1875.0* 2459.0*    CBG: No results found for this basename: GLUCAP,  in the last 168 hours  Coagulation  Studies:  Recent Labs  03/19/13 0527 03/20/13 0600  LABPROT 23.9* 23.5*  INR 2.22* 2.17*    Imaging: No results found.  Current Facility-Administered Medications  Medication Dose Route Frequency Provider Last Rate Last Dose  . 0.9 %  sodium chloride infusion  250 mL Intravenous PRN Dolores Patty, MD      . 0.9 %  sodium chloride infusion   Intravenous Continuous Dolores Patty, MD 15 mL/hr at 03/20/13 0250 15 mL/hr at 03/20/13 0250  . acetaminophen (TYLENOL) tablet 650 mg  650 mg Oral Q4H PRN Dolores Patty, MD   650 mg at 03/19/13 2201  . ALPRAZolam Prudy Feeler) tablet 0.5 mg  0.5 mg Oral QID PRN Dolores Patty, MD   0.5 mg at 03/20/13 2228  . amiodarone (PACERONE) tablet 100 mg  100 mg Oral Daily Aundria Rud, NP   100 mg at 03/20/13 1036  . amLODipine (NORVASC) tablet 10 mg  10 mg Oral Daily Aundria Rud, NP   10 mg at 03/20/13 1035  . aspirin EC tablet 325 mg  325 mg Oral Daily Aundria Rud, NP   325 mg at 03/20/13 1035  . citalopram (CELEXA) tablet 20 mg  20 mg Oral Daily Aundria Rud, NP   20 mg at 03/20/13 1035  . digoxin (LANOXIN) tablet 0.0625 mg  0.0625 mg Oral Daily Aundria Rud, NP   0.0625 mg at 03/20/13 1034  . DOBUTamine (DOBUTREX) infusion 4000 mcg/mL  5 mcg/kg/min Intravenous Titrated Dolores Patty, MD 5.3 mL/hr at 03/20/13 0251 5 mcg/kg/min at 03/20/13 0251  . feeding supplement (RESOURCE BREEZE) (RESOURCE BREEZE) liquid 1 Container  1 Container Oral BID Ailene Ards, RD   1 Container at 03/20/13 2228  . ferrous fumarate-b12-vitamic C-folic acid (TRINSICON / FOLTRIN) capsule 1 capsule  1 capsule Oral TID PC Aundria Rud, NP   1 capsule at 03/20/13 1732  . furosemide (LASIX) injection 80 mg  80 mg Intravenous BID [name] D [name], NP   80 mg at 03/20/13 1730  . hydrALAZINE (APRESOLINE) tablet 25 mg  25 mg Oral Q8H Aundria Rud, NP   25 mg at 03/21/13 0620  . ondansetron (ZOFRAN) injection 4 mg  4 mg Intravenous Q6H PRN Dolores Patty, MD      . pantoprazole (PROTONIX) EC tablet 40 mg  40 mg Oral Daily Aundria Rud, NP   40 mg at 03/20/13 1034  . potassium chloride (K-DUR,KLOR-CON) CR tablet 10 mEq  10 mEq Oral BID Aundria Rud, NP   10 mEq at 03/20/13 2217  . sildenafil (REVATIO) tablet 40 mg  40 mg Oral TID Dolores Patty, MD   40 mg at 03/20/13 2218  .  simvastatin (ZOCOR) tablet 10 mg  10 mg Oral QHS Aundria Rud, NP   10 mg at 03/20/13 2217  . sodium chloride 0.9 % injection 10-40 mL  10-40 mL Intracatheter Q12H Dolores Patty, MD   10 mL at 03/17/13 1105  . sodium chloride 0.9 % injection 10-40 mL  10-40 mL Intracatheter PRN Dolores Patty, MD      . sodium chloride 0.9 % injection 10-40 mL  10-40 mL Intracatheter PRN Dolores Patty, MD   10 mL at 03/21/13 0641  . sodium chloride 0.9 % injection 3 mL  3 mL Intravenous Q12H Dolores Patty, MD   3 mL at 03/18/13 2131  . sodium chloride 0.9 % injection 3 mL  3 mL Intravenous PRN Dolores Patty, MD      . spironolactone (ALDACTONE) tablet 25 mg  25 mg Oral Daily Aundria Rud, NP   25 mg at 03/20/13 1035  . Warfarin - Pharmacist Dosing Inpatient   Does not apply q1800 Severiano Gilbert, [facility]      . zolpidem (AMBIEN) tablet 5 mg  5 mg Oral QHS PRN Aundria Rud, NP         Assessment:   1. Dypnea 2. Chronic biventricular HF Status post HM-II LVAD implantation.  3. PAF  4. Recent VT s/p ICD shock on amio 5. R pleural effusion  6. Transient CHB 7. Chronic renal failure, stage III 8. Recurrent RHF, on dobutamine 9. Anxiety 10. Physical deconditioning 11. Hypokalemia  Plan/Discussion:   CO-OX 43% .  Continue dobutamine at 5 mcg. Continue 40 mg sildenafil tid. She is on digoxin. (dig level 0.5 03/14/13)  Weight up another pound despite addition of IV lasix yesterday. Continue lasix 80 mg IV bid and add 2.5 mg metolazone.    INR 1.8. Pharmacy dosing coumadin. Will need ramp ECHO tomorrow after she has been diuresed.   [facility] to  follow for home dobutamine.   VAD parameters look ok.   Hold discharge.    [name],[name],NP-C 6:55 AM  Patient seen with NP, agree with the above note.  Patient is short of breath the morning.  Co-ox has been consistently low for the last couple of days.  RHC indices and echo suggest RV failure.  She is now on dobutamine gtt/Revatio/digoxin.  She is volume overloaded.  I hope that cardiac output will improve with some diuresis.  Creatinine improved today but she did not diurese much yesterday.  No fever, no evidence for infection. MAPs ok.  - Increase dobutamine to 6 mcg/kg/min - Today will start Lasix gtt 10 mg/hr and add metolazone 2.5 mg x1.   - Ramp echo tomorrow after some diuresis to see if we can increase her flow rate.  - Coox and BMET this evening.   [name] 03/21/2013 8:25 AM

## 2013-03-21 NOTE — Progress Notes (Signed)
Reviewed afternoon lab work. Renal function stable.  Earlier this am Dobutamine increased to 6 mcg per hour. CO-OX increased to 55%.  Discussed with Dr Shirlee Latch. Continue current plan.    [name],[name] 3:09 PM

## 2013-03-21 NOTE — Progress Notes (Addendum)
ANTICOAGULATION CONSULT NOTE   Pharmacy Consult for coumadin Indication: LVAD  No Known Allergies  Patient Measurements: Height: 5\' 3"  (160 cm) Weight: 160 lb 7.9 oz (72.8 kg) IBW/kg (Calculated) : 52.4  Vital Signs: Temp: 98.1 F (36.7 C) (11/19 0621) Temp src: Oral (11/19 0621) BP: 82/0 mmHg (11/19 0620) Pulse Rate: 84 (11/19 1102)  Labs:  Recent Labs  03/19/13 0527 03/20/13 0600 03/21/13 0500  HGB 8.8* 8.9* 8.9*  HCT 26.3* 26.7* 27.2*  PLT 276 278 308  LABPROT 23.9* 23.5* 20.8*  INR 2.22* 2.17* 1.85*  CREATININE 1.42* 1.47* 1.36*    Estimated Creatinine Clearance: 39.5 ml/min (by C-G formula based on Cr of 1.36).  Assessment: Patient is a 67 y.o F currently on coumadin for VAD.  INR down this morning to 1.8? No bleeding documented. No dose charted on 11/16 - decrease could be from missed dose.  Her home dose was 2.5mg  daily except 5mg  on M/W/F.  Goal of Therapy:  INR 2-3 Monitor platelets by anticoagulation protocol: Yes   Plan:  1) coumadin 6mg  PO x1 today  Sheppard Coil PharmD., BCPS Clinical Pharmacist Pager [phone] 03/21/2013 1:21 PM

## 2013-03-21 NOTE — Telephone Encounter (Signed)
Outpatient HF CSW contacted daughter Sherry Ruffing to follow up on medicaid status. Daughter reports she completed medicaid application on Monday and will return required documents to finalize application. Daughter reported Pt. will have a deductible if approved for medicaid. Medicaid application pending and CSW will continue to be available as needed for support and follow up with medicaid. Lasandra Beech, LCSW [phone]

## 2013-03-21 NOTE — Progress Notes (Signed)
Chaplain made a visit to see the patient. Patient said she "remembered the chaplain (me) visiting during a previous admit. The patient's ex-husband was also present. Patient said she "has to stay in the hospital until her breathing gets better." Chaplain provided emotional and pastoral support. Patient requested prayer. Chaplain provided spiritual support by praying with the patient. Patient said she "will tell her daughters that the chaplain (me) came by to see her. Chaplain will follow up with the patient as needed or requested.   03/21/13 1500  Clinical Encounter Type  Visited With Patient and family together  Visit Type Initial;Spiritual support  Spiritual Encounters  Spiritual Needs Prayer

## 2013-03-21 NOTE — Progress Notes (Signed)
CARDIAC REHAB PHASE I   PRE:  Rate/Rhythm: 85     BP: sitting 82    SaO2: 91-92 RA  MODE:  Ambulation: 300 ft   POST:  Rate/Rhythm: 95    BP: sitting 80     SaO2: 91-94 RA  Pt feeling very SOB in bed. Didn't want to walk but I was able to encourage her to get OOB and try. Pt rested multiple times on first lap around circle. DOE, SaO2 91-94 RA. Second lap she pushed through and only rested x1. To recliner after walk. More SOB and weak today but thankful she walked and stated she felt better afterward. Will f/u. Pt remained on batteries in recliner per her choosing. [id number]   Kaitlin [name] CES, ACSM 03/21/2013 3:19 PM

## 2013-03-22 DIAGNOSIS — I369 Nonrheumatic tricuspid valve disorder, unspecified: Secondary | ICD-10-CM

## 2013-03-22 LAB — CARBOXYHEMOGLOBIN
Carboxyhemoglobin: 1.3 % (ref 0.5–1.5)
Methemoglobin: 1 % (ref 0.0–1.5)
O2 Saturation: 44.8 %

## 2013-03-22 LAB — BASIC METABOLIC PANEL
BUN: 28 mg/dL — ABNORMAL HIGH (ref 6–23)
CO2: 25 mEq/L (ref 19–32)
Chloride: 94 mEq/L — ABNORMAL LOW (ref 96–112)
Creatinine, Ser: 1.41 mg/dL — ABNORMAL HIGH (ref 0.50–1.10)
GFR calc Af Amer: 44 mL/min — ABNORMAL LOW (ref >90)
GFR calc non Af Amer: 38 mL/min — ABNORMAL LOW (ref >90)
Potassium: 2.6 mEq/L — CL (ref 3.5–5.1)
Sodium: 132 mEq/L — ABNORMAL LOW (ref 135–145)

## 2013-03-22 LAB — CBC WITH DIFFERENTIAL/PLATELET
Basophils Relative: 0 % (ref 0–1)
HCT: 26.9 % — ABNORMAL LOW (ref 36.0–46.0)
Hemoglobin: 8.9 g/dL — ABNORMAL LOW (ref 12.0–15.0)
Lymphocytes Relative: 11 % — ABNORMAL LOW (ref 12–46)
Lymphs Abs: 0.9 10*3/uL (ref 0.7–4.0)
MCH: 28 pg (ref 26.0–34.0)
MCHC: 33.1 g/dL (ref 30.0–36.0)
Monocytes Relative: 10 % (ref 3–12)
Neutro Abs: 6.5 10*3/uL (ref 1.7–7.7)
Neutrophils Relative %: 78 % — ABNORMAL HIGH (ref 43–77)
RBC: 3.18 MIL/uL — ABNORMAL LOW (ref 3.87–5.11)

## 2013-03-22 LAB — URINALYSIS, ROUTINE W REFLEX MICROSCOPIC
Bilirubin Urine: NEGATIVE
Glucose, UA: NEGATIVE mg/dL
Hgb urine dipstick: NEGATIVE
Specific Gravity, Urine: 1.007 (ref 1.005–1.030)
pH: 5.5 (ref 5.0–8.0)

## 2013-03-22 LAB — PROTIME-INR: Prothrombin Time: 18.8 seconds — ABNORMAL HIGH (ref 11.6–15.2)

## 2013-03-22 MED ORDER — POTASSIUM CHLORIDE CRYS ER 20 MEQ PO TBCR
40.0000 meq | EXTENDED_RELEASE_TABLET | Freq: Three times a day (TID) | ORAL | Status: DC
  Administered 2013-03-22 – 2013-03-23 (×4): 40 meq via ORAL
  Filled 2013-03-22 (×4): qty 2

## 2013-03-22 MED ORDER — ENOXAPARIN SODIUM 80 MG/0.8ML ~~LOC~~ SOLN
70.0000 mg | Freq: Two times a day (BID) | SUBCUTANEOUS | Status: DC
  Administered 2013-03-22 – 2013-03-23 (×3): 70 mg via SUBCUTANEOUS
  Filled 2013-03-22 (×6): qty 0.8

## 2013-03-22 MED ORDER — POTASSIUM CHLORIDE CRYS ER 20 MEQ PO TBCR
80.0000 meq | EXTENDED_RELEASE_TABLET | ORAL | Status: AC
  Administered 2013-03-22: 80 meq via ORAL
  Filled 2013-03-22: qty 4

## 2013-03-22 MED ORDER — WARFARIN SODIUM 7.5 MG PO TABS
7.5000 mg | ORAL_TABLET | Freq: Once | ORAL | Status: AC
  Administered 2013-03-22: 7.5 mg via ORAL
  Filled 2013-03-22: qty 1

## 2013-03-22 NOTE — Discharge Summary (Signed)
Advanced Heart Failure Team  Discharge Summary   Patient ID: Kaitlin [name] MRN: [id number], DOB/AGE: 1945-12-05 67 y.o. Admit date: 03/08/2013 D/C date:     03/23/2013   Primary Discharge Diagnoses:  1. RV failure 2. Chronic systolic heart failure due to NICM EF 15%     --s/p HMII LVAD implant with TV repair 12/18/12     --post-op course complicated by RV failure requiring RVAD support 3. PAF 4. Transient complete heart block 5. Recurrent VT on amiodarone 6. Chronic renal failure     --baseline Cr 1.4-1.6   Hospital Course: Kaitlin [name] is a 67 y/o woman with h/o severe HTN, [name], VT s/p Biotronik ICD (2009) and 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 EF 15-20%. She underwent HM II LVAD implantation with TV repair on 12/18/12 due to progressive HF symptoms and worsening renal failure despite inotropic support with dobutamine (unable to tolerate milrinone due to hypotension.) Her immediate post-op course was complicated by RV failure requiring temporary RVAD support.   Her post VAD course has been complicated by severe fatigue and exercise intolerance as well as a low-grade leukocytosis.She has had multiple admissions. Concern has been over indolent driveline infection versus RV failure. CT scan of the chest/abdomen/pelvis was essentially unremarkable except for a small R effusion and persistent air within her driveline tract. In the setting of a white count that hovered around 11K, she was treated empirically with several rounds of antibiotics without much change in her functional status. All cultures have remained negative.   In October, Echo showed worsening RV function and recurrent severe TR (despite repair) and she underwent RHC to further evaluate her RHF. While this showed elevated R-sided pressures, her outputs were fine and medical therapy was continue with attempts at gentle diuresis. RHC cath numbers as below.   RHC 02/11/13 RA = 17  RV = 35/2/12  PA = 35/15 (22)  PCW = 11  Fick cardiac  output/index = 5.4/3.1  Thermo CO/CI = 3.7/2.1  PVR = 2.0 WU (Fick)  FA sat = assumed 98%  PA sat = 66%, 70%, 70%  RA sat = 76%   She was readmitted on 11/6 with nausea and fatigue. Labs essentially normal. Intermittent CHB on ECG.  Echo 03/09/13 with good LVAD position. RV markedly dilated and severely hypokinetic.Severe TR. Septal flattening c/w RV pressure volume overload.  TEE 03/12/13 showed significant RV dysfunction. no vegetation on valves or ICD wires.  RHC 03/12/13 showed progressive RV failure.  RA = 20  RV = 47/12/19  PA = 49/17 (32)  PCW = 19  Fick cardiac output/index = 4.6/2.7 (using calculated O2 consumption of 228 ml/min)  Fick CO/CI = 3.5/2.1 (uisng measured O2 consumption with metabolic cart 181 ml/min)  Thermo CO/CI = 2.6/1.5  PVR =3.4 WU  PA sat = 59%, 63%  Ao sat 95%  Started on milrinone without much benefit. Changed to dobutamine. Continued with fatigue and volume overload. Mixed venous sats remained low (72%>49%>51%>43% >55%> 44%) despite increasing doses of dobutamine up to 14mcg/kg/min and continuing sildenafil.Despite RHF very few PI events on VAD.  Ramp study performed on 11/20 in attempt to optimize outputs. (see attached). Speed turned up 8600->9400 watching closely for worsening RHF.   Due to persistent RHF decision made to transfer to [facility] to expedite transplant evaluation. (Blood type A pos)  On morning of transfer, (after RAMP ECHO) patient feeling much better and MV sat at 65%.     Discharge Weight Range: 154 Discharge Vitals: Blood pressure 82/0,  pulse 79, temperature 97.6 F (36.4 C), temperature source Oral, resp. rate 18, height 5\' 3"  (1.6 m), weight 154 lb 5.2 oz (70 kg), SpO2 98.00%.  GENERAL: no acute distress; lying in bed  HEENT: normal  NECK: Supple, JVP 7-8 . 2+ bilaterally, no bruits. No lymphadenopathy or thyromegaly appreciated.  CARDIAC: Mechanical heart sounds with LVAD hum present.  LUNGS: Minimal crackles at the bases.   ABDOMEN: Soft, round, tender around rib cage, positive bowel sounds x4.  LVAD exit site: well-healed and incorporated. Dressing dry and intact. No erythema or drainage. Stabilization device present and accurately applied. No tenderness to palpation at all along driveline tract.  EXTREMITIES: no cyanosis, clubbing, rash. R and LLE 1+ edema; RUE 2 PICC  NEUROLOGIC: Alert and oriented x 4. Gait steady. No aphasia. No dysarthria. Affect pleasant.   Labs: Lab Results  Component Value Date   WBC 8.1 03/23/2013   HGB 8.8* 03/23/2013   HCT 27.2* 03/23/2013   MCV 85.5 03/23/2013   PLT 316 03/23/2013     Recent Labs Lab 03/23/13 0545  NA 137  K 3.1*  CL 99  CO2 26  BUN 26*  CREATININE 1.51*  CALCIUM 10.1  GLUCOSE 116*   Lab Results  Component Value Date   CHOL 153 04/14/2010   HDL 44.60 04/14/2010   LDLCALC 92 04/14/2010   TRIG 159* 12/25/2012   BNP (last 3 results)  Recent Labs  02/28/13 1423 03/07/13 1307 03/08/13 2155  PROBNP 2290.0* 1875.0* 2459.0*    Diagnostic Studies/Procedures   No results found.  Discharge Medications     Medication List    STOP taking these medications       TAZICEF 1 G Solr injection  Generic drug:  cefTAZidime     torsemide 20 MG tablet  Commonly known as:  DEMADEX      TAKE these medications       acetaminophen 325 MG tablet  Commonly known as:  TYLENOL  Take 650 mg by mouth every 6 (six) hours as needed for pain.     amiodarone 100 MG tablet  Commonly known as:  PACERONE  Take 1 tablet (100 mg total) by mouth daily.     amLODipine 10 MG tablet  Commonly known as:  NORVASC  Take 1 tablet (10 mg total) by mouth daily.     aspirin 325 MG EC tablet  Take 1 tablet (325 mg total) by mouth daily.     citalopram 20 MG tablet  Commonly known as:  CELEXA  Take 1 tablet (20 mg total) by mouth daily.     dextrose 5 % SOLN 225 mL with furosemide 10 MG/ML SOLN 250 mg  Inject 5 mg/hr into the vein continuous.     Digoxin  62.5 MCG Tabs  Take 0.0625 mg by mouth daily.     DOBUTamine 4-5 MG/ML-% infusion  Commonly known as:  DOBUTREX  Inject 420.6 mcg/min into the vein continuous.     enoxaparin 80 MG/0.8ML injection  Commonly known as:  LOVENOX  Inject 0.7 mLs (70 mg total) into the skin every 12 (twelve) hours.     feeding supplement (RESOURCE BREEZE) Liqd  Take 1 Container by mouth 2 (two) times daily.     ferrous fumarate-b12-vitamic C-folic acid capsule  Commonly known as:  TRINSICON / FOLTRIN  Take 1 capsule by mouth 3 (three) times daily after meals.     hydrALAZINE 25 MG tablet  Commonly known as:  APRESOLINE  Take 1 tablet (25  mg total) by mouth every 8 (eight) hours.     ondansetron 4 MG/2ML Soln injection  Commonly known as:  ZOFRAN  Inject 2 mLs (4 mg total) into the vein every 6 (six) hours as needed for nausea.     pantoprazole 40 MG tablet  Commonly known as:  PROTONIX  Take 1 tablet (40 mg total) by mouth daily.     potassium chloride SA 20 MEQ tablet  Commonly known as:  K-DUR,KLOR-CON  Take 2 tablets (40 mEq total) by mouth 3 (three) times daily.     sildenafil 20 MG tablet  Commonly known as:  REVATIO  Take 2 tablets (40 mg total) by mouth 3 (three) times daily.     simvastatin 10 MG tablet  Commonly known as:  ZOCOR  Take 1 tablet (10 mg total) by mouth at bedtime.     simvastatin 10 MG tablet  Commonly known as:  ZOCOR  Take 1 tablet (10 mg total) by mouth at bedtime.     sodium chloride 0.9 % injection  10-40 mLs by Intracatheter route every 12 (twelve) hours.     sodium chloride 0.9 % injection  10-40 mLs by Intracatheter route as needed (flush).     sodium chloride 0.9 % injection  10-40 mLs by Intracatheter route as needed (flush).     sodium chloride 0.9 % injection  Inject 3 mLs into the vein every 12 (twelve) hours.     sodium chloride 0.9 % injection  Inject 3 mLs into the vein as needed.     spironolactone 25 MG tablet  Commonly known as:   ALDACTONE  Take 1 tablet (25 mg total) by mouth daily.     warfarin 2.5 MG tablet  Commonly known as:  COUMADIN  Take 1-2 tablets (2.5-5 mg total) by mouth daily. Take 7.5 mg 03/23/13 then resume 2.5mg  every day except 5mg  on Monday, Wednesday, Friday     zolpidem 5 MG tablet  Commonly known as:  AMBIEN  Take 1 tablet (5 mg total) by mouth at bedtime as needed for sleep.        Disposition   The patient will be discharged in stable condition to home. Discharge Orders   Future Appointments Provider Department Dept Phone   03/27/2013 2:00 PM [facility] [phone]   05/08/2013 8:25 AM [facility] [employer] [phone]   Future Orders Complete By Expires   Contraindication to ACEI at discharge  As directed    Comments:     ckd   Diet - low sodium heart healthy  As directed    Heart Failure patients record your daily weight using the same scale at the same time of day  As directed    Increase activity slowly  As directed      Follow-up Information   Follow up with Arvilla Meres, MD On 03/27/2013. (at 2:00 Garage Code 2000)    Specialty:  Cardiology   Contact information:   [address] [place] [address] [phone]         Duration of Discharge Encounter: Greater than 35 minutes   Signed, [name] 12:51 PM

## 2013-03-22 NOTE — Progress Notes (Signed)
CRITICAL VALUE ALERT  Critical value received:  Potassium    Date of notification:  03/22/13  Time of notification:  0823  Critical value read back: yes  Nurse who received alert:  Clovis Fredrickson   MD notified (1st page):  Bensimhon   Time of first page:  0825  MD notified (2nd page): [name]  Time of second page: [phone]  Responding MD:  Filbert Schilder  Time MD responded:  [phone]

## 2013-03-22 NOTE — Progress Notes (Signed)
ANTICOAGULATION CONSULT NOTE - Follow Up Consult  Pharmacy Consult for Coumadin and Lovenox Indication: post LVAD implantation  No Known Allergies  Patient Measurements: Height: 5\' 3"  (160 cm) Weight: 157 lb (71.215 kg) IBW/kg (Calculated) : 52.4  Vital Signs: Temp: 98.1 F (36.7 C) (11/20 0605) Temp src: Oral (11/20 0605) Pulse Rate: 86 (11/20 1123)  Labs:  Recent Labs  03/20/13 0600 03/21/13 0500 03/21/13 1415 03/22/13 0620  HGB 8.9* 8.9*  --  8.9*  HCT 26.7* 27.2*  --  26.9*  PLT 278 308  --  320  LABPROT 23.5* 20.8*  --  18.8*  INR 2.17* 1.85*  --  1.62*  CREATININE 1.47* 1.36* 1.44* 1.41*    Estimated Creatinine Clearance: 37.6 ml/min (by C-G formula based on Cr of 1.41).  Assessment: 67yof admitted 11/6 with nausea continues on coumadin for LVAD (12/18/12). INR is trending down and is subtherapeutic at 1.6. No dose charted on 11/16 so decrease could be from missed dose. Will add lovenox today. Wt 71.2kg. Renal function is fairly stable with sCr 1.4 and CrCl 58ml/min.  Home regimen: 2.5mg  daily except 5mg  MWF  Goal of Therapy:  INR 2-3 Monitor platelets by anticoagulation protocol: Yes   Plan:  1) Increase coumadin to 7.5mg  x 1 2) Add lovenox 70mg  sq q12 3) INR in AM  Fredrik Rigger 03/22/2013,1:43 PM

## 2013-03-22 NOTE — Progress Notes (Signed)
Pt amb 400 ft pushing walker. Stopped twice with complaints of SOB/DOE. Pt assisted to edge of bed to sit up for dinner.  Call bell within reach. Will continue to monitor pt closely.

## 2013-03-22 NOTE — Progress Notes (Signed)
  Case d/w with Dr. Allena Katz at [facility] Will plan transfer to [facility] tomorrow for transplant evaluation due to persistent RV failure s/p HM II VAD placement 8/14.  D/w bed manager Renae Fickle) @ [phone] Face sheet faxed to [phone]  [name] 2:25 PM

## 2013-03-22 NOTE — Progress Notes (Signed)
  Echocardiogram 2D Echocardiogram and RAMP study has been performed.  Kaitlin [name] [name] 03/22/2013, 2:11 PM

## 2013-03-22 NOTE — Progress Notes (Signed)
Weekly LVAD dressing changed using sterile technique. Biopatch applied around driveline. Skin surrounding area intact. No drainage from driveline. Date applied to dressing.  Anchor secured to skin. Will continue to monitor pt closely.

## 2013-03-22 NOTE — Progress Notes (Signed)
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 Post-op couse c/b RHF requiring take back to the OR for bleeding and temproray RVAD support.   Admitted 11/6 with nausea. Echo 03/09/13 Unable to see valves well. RV markedly dilated and severely hypokinetic. Septal flattening c/w RV pressure volume overload.   TEE 03/12/13 showed significant RV dysfunction. no vegetation on valves or ICD wires. RHC 03/12/13 d/t worsening RHF. Started on milrinone 0.25. Changed to dobutamine gtt. Now at 82mcg/min  RHC:  RA = 20  RV = 47/12/19  PA = 49/17 (32)  PCW = 19  Fick cardiac output/index = 4.6/2.7 (using calculated O2 consumption of 228 ml/min)  Fick CO/CI = 3.5/2.1 (uisng measured O2 consumption with metabolic cart 181 ml/min)  Thermo CO/CI = 2.6/1.5  PVR =3.4 WU  PA sat = 59%, 63%  Ao sat 95%  03/18/13 with occasional CHB and bradycardia -->back up rate adjusted to 70 bpm during the day and 60 bpm in pm.   Yesterday she was started on a lasix drip at 10 mg per hour and dobutamine increased to 6 mcg per hour.  Weight down 3 pounds. Feeling a little better.   Co-ox 72%>49%>51%>43% >55%> 44 MAP 80s Creatinine 1.2 >1.4> 1.47>1.3> pending INR 1.6    VAD interrogation  RPM 8590 Flow 4.9L Power 4.7 PI 5.3 had 2  PI events  Objective:    Vital Signs:   Temp:  [97.5 F (36.4 C)-98.1 F (36.7 C)] 98.1 F (36.7 C) (11/20 0605) Pulse Rate:  [84-89] 87 (11/20 0605) Resp:  [20] 20 (11/20 0605) SpO2:  [92 %-95 %] 93 % (11/20 0605) FiO2 (%):  [2 %] 2 % (11/19 1345) Weight:  [157 lb (71.215 kg)] 157 lb (71.215 kg) (11/20 0605) Last BM Date: 03/21/13 Filed Weights   03/20/13 0846 03/21/13 0621 03/22/13 0605  Weight: 159 lb 6.3 oz (72.3 kg) 160  lb 7.9 oz (72.8 kg) 157 lb (71.215 kg)    Physical Exam: GENERAL: no acute distress; lying in bed HEENT: normal  NECK: Supple, JVP ~10  . 2+ bilaterally, no bruits. No lymphadenopathy or thyromegaly appreciated.  CARDIAC: Mechanical heart sounds with LVAD hum present.  LUNGS: Minimal crackles at the bases. ABDOMEN: Soft, round, tender around rib cage, positive bowel sounds x4.  LVAD exit site: well-healed and incorporated. Dressing dry and intact. No erythema or drainage. Stabilization device present and accurately applied. No tenderness to palpation at all along driveline tract.  EXTREMITIES:  no cyanosis, clubbing, rash. R and LLE 1-2+ edema; RUE 2 PICC NEUROLOGIC: Alert and oriented x 4. Gait steady. No aphasia. No dysarthria. Affect pleasant.     Labs: Basic Metabolic Panel:  Recent Labs Lab 03/18/13 0600 03/19/13 0527 03/20/13 0600 03/21/13 0500 03/21/13 1415  NA 134* 134* 134* 134* 134*  K 3.6 3.7 3.4* 3.4* 3.6  CL 101 99 98 99 97  CO2 23 23 22 22 22   GLUCOSE 98 107* 105* 95 142*  BUN 20 24* 28* 25* 26*  CREATININE 1.28* 1.42* 1.47* 1.36* 1.44*  CALCIUM 9.7 9.8 10.0 9.9 9.9    Liver Function Tests: No results found for this basename: AST, ALT, ALKPHOS, BILITOT, PROT, ALBUMIN,  in  the last 168 hours No results found for this basename: LIPASE, AMYLASE,  in the last 168 hours No results found for this basename: AMMONIA,  in the last 168 hours  CBC:  Recent Labs Lab 03/18/13 0600 03/19/13 0527 03/20/13 0600 03/21/13 0500 03/22/13 0620  WBC 8.4 9.1 9.0 8.9 8.4  NEUTROABS 6.6 7.4 7.3 7.1 6.5  HGB 9.0* 8.8* 8.9* 8.9* 8.9*  HCT 27.7* 26.3* 26.7* 27.2* 26.9*  MCV 86.8 85.1 84.8 85.3 84.6  PLT 271 276 278 308 320    Cardiac Enzymes: No results found for this basename: CKTOTAL, CKMB, CKMBINDEX, TROPONINI,  in the last 168 hours  BNP: BNP (last 3 results)  Recent Labs  02/28/13 1423 03/07/13 1307 03/08/13 2155  PROBNP 2290.0* 1875.0* 2459.0*     CBG: No results found for this basename: GLUCAP,  in the last 168 hours  Coagulation Studies:  Recent Labs  03/20/13 0600 03/21/13 0500 03/22/13 0620  LABPROT 23.5* 20.8* 18.8*  INR 2.17* 1.85* 1.62*    Imaging: No results found.  Current Facility-Administered Medications  Medication Dose Route Frequency Provider Last Rate Last Dose  . 0.9 %  sodium chloride infusion  250 mL Intravenous PRN Kaitlin Patty, MD      . 0.9 %  sodium chloride infusion   Intravenous Continuous Kaitlin Patty, MD 20 mL/hr at 03/21/13 [phone]    . acetaminophen (TYLENOL) tablet 650 mg  650 mg Oral Q4H PRN Kaitlin Patty, MD   650 mg at 03/21/13 1427  . ALPRAZolam Prudy Feeler) tablet 0.5 mg  0.5 mg Oral QID PRN Kaitlin Patty, MD   0.5 mg at 03/21/13 2301  . amiodarone (PACERONE) tablet 100 mg  100 mg Oral Daily Kaitlin Rud, NP   100 mg at 03/21/13 1103  . amLODipine (NORVASC) tablet 10 mg  10 mg Oral Daily Kaitlin Rud, NP   10 mg at 03/21/13 1103  . aspirin EC tablet 325 mg  325 mg Oral Daily Kaitlin Rud, NP   325 mg at 03/21/13 1103  . citalopram (CELEXA) tablet 20 mg  20 mg Oral Daily Kaitlin Rud, NP   20 mg at 03/21/13 1103  . digoxin (LANOXIN) tablet 0.0625 mg  0.0625 mg Oral Daily Kaitlin Rud, NP   0.0625 mg at 03/21/13 1102  . DOBUTamine (DOBUTREX) infusion 4000 mcg/mL  6 mcg/kg/min Intravenous Titrated Kaitlin D [name], NP 6.3 mL/hr at 03/21/13 2309 6 mcg/kg/min at 03/21/13 2309  . feeding supplement (RESOURCE BREEZE) (RESOURCE BREEZE) liquid 1 Container  1 Container Oral BID Kaitlin [name], RD   1 Container at 03/21/13 2300  . ferrous fumarate-b12-vitamic C-folic acid (TRINSICON / FOLTRIN) capsule 1 capsule  1 capsule Oral TID PC Kaitlin Rud, NP   1 capsule at 03/21/13 1744  . furosemide (LASIX) 250 mg in dextrose 5 % 250 mL infusion  10 mg/hr Intravenous Continuous Kaitlin D [name], NP 10 mL/hr at 03/21/13 0936 10 mg/hr at 03/21/13 0936  . hydrALAZINE (APRESOLINE) tablet  25 mg  25 mg Oral Q8H Kaitlin Rud, NP   25 mg at 03/22/13 0618  . ondansetron (ZOFRAN) injection 4 mg  4 mg Intravenous Q6H PRN Kaitlin Patty, MD      . pantoprazole (PROTONIX) EC tablet 40 mg  40 mg Oral Daily Kaitlin Rud, NP   40 mg at 03/21/13 1102  . potassium chloride (K-DUR,KLOR-CON) CR tablet 40 mEq  40 mEq Oral BID Kaitlin D [name],  NP   40 mEq at 03/21/13 2301  . sildenafil (REVATIO) tablet 40 mg  40 mg Oral TID Kaitlin Patty, MD   40 mg at 03/21/13 2301  . simvastatin (ZOCOR) tablet 10 mg  10 mg Oral QHS Kaitlin Rud, NP   10 mg at 03/21/13 2301  . sodium chloride 0.9 % injection 10-40 mL  10-40 mL Intracatheter Q12H Kaitlin Patty, MD   10 mL at 03/17/13 1105  . sodium chloride 0.9 % injection 10-40 mL  10-40 mL Intracatheter PRN Kaitlin Patty, MD      . sodium chloride 0.9 % injection 10-40 mL  10-40 mL Intracatheter PRN Kaitlin Patty, MD   10 mL at 03/22/13 0619  . sodium chloride 0.9 % injection 3 mL  3 mL Intravenous Q12H Kaitlin Patty, MD   3 mL at 03/18/13 2131  . sodium chloride 0.9 % injection 3 mL  3 mL Intravenous PRN Kaitlin Patty, MD      . spironolactone (ALDACTONE) tablet 25 mg  25 mg Oral Daily Kaitlin Rud, NP   25 mg at 03/21/13 1103  . Warfarin - Pharmacist Dosing Inpatient   Does not apply q1800 Kaitlin [name], [facility]      . zolpidem (AMBIEN) tablet 5 mg  5 mg Oral QHS PRN Kaitlin Rud, NP         Assessment:   1. Dypnea 2. Chronic biventricular HF Status post HM-II LVAD implantation.  3. PAF  4. Recent VT s/p ICD shock on amio 5. R pleural effusion  6. Transient CHB 7. Chronic renal failure, stage III 8. Recurrent RHF, on dobutamine 9. Anxiety 10. Physical deconditioning 11. Hypokalemia  Plan/Discussion:   CO-OX 44% .  Continue dobutamine at 6 mcg. Continue 40 mg sildenafil tid. She is on digoxin. (dig level 0.5 03/14/13)  Weight down 3 pounds. On Lasix drip 10 mg per hour.   Repeat ECHO today  INR 1.6 Will need  to add lovenoz. Pharmacy dosing coumadin.  [facility] to follow for home dobutamine.   VAD parameters look ok.   Hold discharge. Kaitlin [name]    [name],[name],NP-C 7:25 AM  Patient seen and examined with Kaitlin Becket, NP. We discussed all aspects of the encounter. I agree with the assessment and plan as stated above.   Diuresing well on IV lasix. Minimal events on VAD but co-ox continues to fall despite increasing inotropes which concerns me for worsening RHF. Will check RAMP echo today. Will discuss with [facility] about inpatient transfer to facilitate expedited transplant evaluation. Start lovenox for low INR.   Kaitlin [name] 7:38 AM

## 2013-03-22 NOTE — Progress Notes (Addendum)
Speed    Flow     PI   Power   LVIDD      AI   Aortic openings     MR      TR  Septum        RV  8600 4.7 5.7 4.8 5.4 none 0/5 Mild-mod severe Bowed toward right Mod - severe HK  8800 4.7 5.6 4.8 5.4 none 0/5 mod severe Bowed toward right Mod - severe HK; markedly dilated  9000 4.8 5.7 5.2 5.4 none 0/5 mod severe Bowed toward right Mod-severe HK  9400 5.0 5.0 5.7 5.4 none 0/5 mod severe midline Mod-severe HK  9800 5.5 4.2 6.4 5.3 none 0/5 mild severe midline Mod - severe HK  10,000 5.6 4.0 6.8 5.0 none 0/5 mild severe Pulling to left   9400 5.0 4.9 5.8                      Ramp ECHO performed at bedside with Dr. Gala Romney.  At completion of ramp study, patients primary and back up controller programmed:   Fixed speed:  9400  RPM  Low speed limit:  8800  RPM    I was present and directed entire procedure. Agree with above. Given RV dysfunction will watch closely for PI events with increased pump speed.  Daniel Bensimhon,MD 2:26 PM

## 2013-03-23 DIAGNOSIS — I5022 Chronic systolic (congestive) heart failure: Principal | ICD-10-CM

## 2013-03-23 DIAGNOSIS — I5023 Acute on chronic systolic (congestive) heart failure: Secondary | ICD-10-CM

## 2013-03-23 LAB — PROTIME-INR
INR: 1.72 — ABNORMAL HIGH (ref 0.00–1.49)
Prothrombin Time: 19.7 seconds — ABNORMAL HIGH (ref 11.6–15.2)

## 2013-03-23 LAB — BASIC METABOLIC PANEL
BUN: 26 mg/dL — ABNORMAL HIGH (ref 6–23)
CO2: 26 mEq/L (ref 19–32)
Chloride: 99 mEq/L (ref 96–112)
GFR calc Af Amer: 41 mL/min — ABNORMAL LOW (ref >90)
Potassium: 3.1 mEq/L — ABNORMAL LOW (ref 3.5–5.1)
Sodium: 137 mEq/L (ref 135–145)

## 2013-03-23 LAB — CBC WITH DIFFERENTIAL/PLATELET
Basophils Absolute: 0 10*3/uL (ref 0.0–0.1)
Basophils Relative: 0 % (ref 0–1)
Eosinophils Relative: 1 % (ref 0–5)
HCT: 27.2 % — ABNORMAL LOW (ref 36.0–46.0)
Hemoglobin: 8.8 g/dL — ABNORMAL LOW (ref 12.0–15.0)
Lymphocytes Relative: 16 % (ref 12–46)
Lymphs Abs: 1.3 10*3/uL (ref 0.7–4.0)
MCH: 27.7 pg (ref 26.0–34.0)
MCHC: 32.4 g/dL (ref 30.0–36.0)
MCV: 85.5 fL (ref 78.0–100.0)
Monocytes Absolute: 0.8 10*3/uL (ref 0.1–1.0)
Monocytes Relative: 9 % (ref 3–12)
Neutro Abs: 5.9 10*3/uL (ref 1.7–7.7)
RBC: 3.18 MIL/uL — ABNORMAL LOW (ref 3.87–5.11)
RDW: 18 % — ABNORMAL HIGH (ref 11.5–15.5)
WBC: 8.1 10*3/uL (ref 4.0–10.5)

## 2013-03-23 LAB — CARBOXYHEMOGLOBIN
Carboxyhemoglobin: 1.5 % (ref 0.5–1.5)
Methemoglobin: 1.1 % (ref 0.0–1.5)

## 2013-03-23 MED ORDER — DIGOXIN 62.5 MCG PO TABS: 0.0625 mg | ORAL_TABLET | Freq: Every day | ORAL | Status: DC

## 2013-03-23 MED ORDER — WARFARIN SODIUM 2.5 MG PO TABS: 2.5000 mg | ORAL_TABLET | Freq: Every day | ORAL | Status: DC

## 2013-03-23 MED ORDER — POTASSIUM CHLORIDE CRYS ER 20 MEQ PO TBCR
40.0000 meq | EXTENDED_RELEASE_TABLET | Freq: Once | ORAL | Status: AC
  Administered 2013-03-23: 40 meq via ORAL

## 2013-03-23 MED ORDER — SODIUM CHLORIDE 0.9 % IJ SOLN: 10.0000 mL | INTRAMUSCULAR | Status: DC | PRN

## 2013-03-23 MED ORDER — DOBUTAMINE IN D5W 4-5 MG/ML-% IV SOLN: 6.0000 ug/kg/min | INTRAVENOUS | Status: DC

## 2013-03-23 MED ORDER — ONDANSETRON HCL 4 MG/2ML IJ SOLN: 4.0000 mg | Freq: Four times a day (QID) | INTRAMUSCULAR | Status: DC | PRN

## 2013-03-23 MED ORDER — BOOST / RESOURCE BREEZE PO LIQD: 1.0000 | Freq: Two times a day (BID) | ORAL | Status: DC

## 2013-03-23 MED ORDER — SODIUM CHLORIDE 0.9 % IJ SOLN: 3.0000 mL | INTRAMUSCULAR | Status: DC | PRN

## 2013-03-23 MED ORDER — POTASSIUM CHLORIDE CRYS ER 20 MEQ PO TBCR: 40.0000 meq | EXTENDED_RELEASE_TABLET | Freq: Three times a day (TID) | ORAL | Status: DC

## 2013-03-23 MED ORDER — FE FUMARATE-B12-VIT C-FA-IFC PO CAPS: 1.0000 | ORAL_CAPSULE | Freq: Three times a day (TID) | ORAL | Status: DC

## 2013-03-23 MED ORDER — SIMVASTATIN 10 MG PO TABS: 10.0000 mg | ORAL_TABLET | Freq: Every day | ORAL | Status: DC

## 2013-03-23 MED ORDER — AMIODARONE HCL 100 MG PO TABS: 100.0000 mg | ORAL_TABLET | Freq: Every day | ORAL | Status: DC

## 2013-03-23 MED ORDER — SODIUM CHLORIDE 0.9 % IJ SOLN: 10.0000 mL | Freq: Two times a day (BID) | INTRAMUSCULAR | Status: DC

## 2013-03-23 MED ORDER — ENOXAPARIN SODIUM 80 MG/0.8ML ~~LOC~~ SOLN: 70.0000 mg | Freq: Two times a day (BID) | SUBCUTANEOUS | Status: DC

## 2013-03-23 MED ORDER — AMLODIPINE BESYLATE 10 MG PO TABS: 10.0000 mg | ORAL_TABLET | Freq: Every day | ORAL | Status: DC

## 2013-03-23 MED ORDER — FUROSEMIDE 10 MG/ML IJ SOLN: 5.0000 mg/h | INTRAVENOUS | Status: DC

## 2013-03-23 MED ORDER — SODIUM CHLORIDE 0.9 % IJ SOLN: 3.0000 mL | Freq: Two times a day (BID) | INTRAMUSCULAR | Status: DC

## 2013-03-23 MED ORDER — HYDRALAZINE HCL 25 MG PO TABS: 25.0000 mg | ORAL_TABLET | Freq: Three times a day (TID) | ORAL | Status: DC

## 2013-03-23 NOTE — Progress Notes (Signed)
Pt discharged to [facility] in route to [facility]ot called to [employer] at [facility]

## 2013-03-23 NOTE — Progress Notes (Signed)
Advanced Heart Failure Team History and Physical Note    HPI:    Ms. 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 Post-op couse c/b RHF requiring take back to the OR for bleeding and temproray RVAD support.   Admitted 11/6 with nausea. Echo 03/09/13 Unable to see valves well. RV markedly dilated and severely hypokinetic. Septal flattening c/w RV pressure volume overload.   TEE 03/12/13 showed significant RV dysfunction. no vegetation on valves or ICD wires. RHC 03/12/13 d/t worsening RHF. Started on milrinone 0.25. Changed to dobutamine gtt. Now at 64mcg/min  RHC:  RA = 20  RV = 47/12/19  PA = 49/17 (32)  PCW = 19  Fick cardiac output/index = 4.6/2.7 (using calculated O2 consumption of 228 ml/min)  Fick CO/CI = 3.5/2.1 (uisng measured O2 consumption with metabolic cart 181 ml/min)  Thermo CO/CI = 2.6/1.5  PVR =3.4 WU  PA sat = 59%, 63%  Ao sat 95%  03/18/13 with occasional CHB and bradycardia -->back up rate adjusted to 70 bpm during the day and 60 bpm in pm.   Yesterday she was started on a lasix drip at 10 mg per hour and dobutamine increased to 6 mcg per hour. Also fixed VAD speed increased 9400 after ramp study performed.  Weight down 3 pounds. Feeling a little better. Mild dyspnea.   Co-ox 72%>49%>51%>43% >55%> 44 MAP 80s Creatinine 1.2 >1.4> 1.47>1.3>1.4> 1.5 INR 1.7   VAD interrogation  RPM 9400 Flow 4.9L Power 5.3 PI 5.7  had 2  PI events  Objective:    Vital Signs:   Temp:  [97.6 F (36.4 C)-98.1 F (36.7 C)] 97.6 F (36.4 C) (11/21 0636) Pulse Rate:  [74-86] 79 (11/20 1939) Resp:  [18] 18 (11/20 1444) SpO2:  [98 %] 98 % (11/20 1444) Weight:  [154 lb 5.2 oz (70 kg)] 154 lb 5.2 oz (70 kg) (11/21 0500) Last BM Date: 03/22/13 Filed Weights   03/21/13 0621 03/22/13 0605  03/23/13 0500  Weight: 160 lb 7.9 oz (72.8 kg) 157 lb (71.215 kg) 154 lb 5.2 oz (70 kg)    Physical Exam: GENERAL: no acute distress; lying in bed HEENT: normal  NECK: Supple, JVP 7-8  . 2+ bilaterally, no bruits. No lymphadenopathy or thyromegaly appreciated.  CARDIAC: Mechanical heart sounds with LVAD hum present.  LUNGS: Minimal crackles at the bases. ABDOMEN: Soft, round, tender around rib cage, positive bowel sounds x4.  LVAD exit site: well-healed and incorporated. Dressing dry and intact. No erythema or drainage. Stabilization device present and accurately applied. No tenderness to palpation at all along driveline tract.  EXTREMITIES:  no cyanosis, clubbing, rash. R and LLE trace edema; RUE 2 PICC NEUROLOGIC: Alert and oriented x 4. Gait steady. No aphasia. No dysarthria. Affect pleasant.     Labs: Basic Metabolic Panel:  Recent Labs Lab 03/20/13 0600 03/21/13 0500 03/21/13 1415 03/22/13 0620 03/23/13 0545  NA 134* 134* 134* 132* 137  K 3.4* 3.4* 3.6 2.6* 3.1*  CL 98 99 97 94* 99  CO2 22 22 22 25 26   GLUCOSE 105* 95 142* 120* 116*  BUN 28* 25* 26* 28* 26*  CREATININE 1.47* 1.36* 1.44* 1.41* 1.51*  CALCIUM 10.0 9.9 9.9 9.9 10.1    Liver Function Tests: No results found for this basename: AST, ALT,  ALKPHOS, BILITOT, PROT, ALBUMIN,  in the last 168 hours No results found for this basename: LIPASE, AMYLASE,  in the last 168 hours No results found for this basename: AMMONIA,  in the last 168 hours  CBC:  Recent Labs Lab 03/19/13 0527 03/20/13 0600 03/21/13 0500 03/22/13 0620 03/23/13 0545  WBC 9.1 9.0 8.9 8.4 8.1  NEUTROABS 7.4 7.3 7.1 6.5 5.9  HGB 8.8* 8.9* 8.9* 8.9* 8.8*  HCT 26.3* 26.7* 27.2* 26.9* 27.2*  MCV 85.1 84.8 85.3 84.6 85.5  PLT 276 278 308 320 316    Cardiac Enzymes: No results found for this basename: CKTOTAL, CKMB, CKMBINDEX, TROPONINI,  in the last 168 hours  BNP: BNP (last 3 results)  Recent Labs  02/28/13 1423 03/07/13 1307  03/08/13 2155  PROBNP 2290.0* 1875.0* 2459.0*    CBG: No results found for this basename: GLUCAP,  in the last 168 hours  Coagulation Studies:  Recent Labs  03/21/13 0500 03/22/13 0620 03/23/13 0545  LABPROT 20.8* 18.8* 19.7*  INR 1.85* 1.62* 1.72*    Imaging: No results found.  Current Facility-Administered Medications  Medication Dose Route Frequency Provider Last Rate Last Dose  . 0.9 %  sodium chloride infusion  250 mL Intravenous PRN Dolores Patty, MD      . 0.9 %  sodium chloride infusion   Intravenous Continuous Dolores Patty, MD 20 mL/hr at 03/21/13 [phone]    . acetaminophen (TYLENOL) tablet 650 mg  650 mg Oral Q4H PRN Dolores Patty, MD   650 mg at 03/23/13 1191  . ALPRAZolam Prudy Feeler) tablet 0.5 mg  0.5 mg Oral QID PRN Dolores Patty, MD   0.5 mg at 03/22/13 2155  . amiodarone (PACERONE) tablet 100 mg  100 mg Oral Daily Aundria Rud, NP   100 mg at 03/22/13 1123  . amLODipine (NORVASC) tablet 10 mg  10 mg Oral Daily Aundria Rud, NP   10 mg at 03/22/13 1124  . aspirin EC tablet 325 mg  325 mg Oral Daily Aundria Rud, NP   325 mg at 03/22/13 1124  . citalopram (CELEXA) tablet 20 mg  20 mg Oral Daily Aundria Rud, NP   20 mg at 03/22/13 1124  . digoxin (LANOXIN) tablet 0.0625 mg  0.0625 mg Oral Daily Aundria Rud, NP   0.0625 mg at 03/22/13 1123  . DOBUTamine (DOBUTREX) infusion 4000 mcg/mL  6 mcg/kg/min Intravenous Titrated [name] D [name], NP 6.3 mL/hr at 03/21/13 2309 6 mcg/kg/min at 03/21/13 2309  . enoxaparin (LOVENOX) injection 70 mg  70 mg Subcutaneous Q12H Hessie Diener [place], RPH   70 mg at 03/22/13 2155  . feeding supplement (RESOURCE BREEZE) (RESOURCE BREEZE) liquid 1 Container  1 Container Oral BID Ailene Ards, RD   1 Container at 03/22/13 2155  . ferrous fumarate-b12-vitamic C-folic acid (TRINSICON / FOLTRIN) capsule 1 capsule  1 capsule Oral TID PC Aundria Rud, NP   1 capsule at 03/22/13 1835  . furosemide (LASIX) 250 mg in  dextrose 5 % 250 mL infusion  10 mg/hr Intravenous Continuous [name] D [name], NP 10 mL/hr at 03/22/13 1132 10 mg/hr at 03/22/13 1132  . hydrALAZINE (APRESOLINE) tablet 25 mg  25 mg Oral Q8H Aundria Rud, NP   25 mg at 03/23/13 4782  . ondansetron (ZOFRAN) injection 4 mg  4 mg Intravenous Q6H PRN Dolores Patty, MD      . pantoprazole (PROTONIX) EC tablet 40 mg  40 mg Oral  Daily Aundria Rud, NP   40 mg at 03/22/13 1126  . potassium chloride SA (K-DUR,KLOR-CON) CR tablet 40 mEq  40 mEq Oral TID Sherald Hess, NP   40 mEq at 03/22/13 2156  . sildenafil (REVATIO) tablet 40 mg  40 mg Oral TID Dolores Patty, MD   40 mg at 03/22/13 2156  . simvastatin (ZOCOR) tablet 10 mg  10 mg Oral QHS Aundria Rud, NP   10 mg at 03/22/13 2156  . sodium chloride 0.9 % injection 10-40 mL  10-40 mL Intracatheter Q12H Dolores Patty, MD   10 mL at 03/17/13 1105  . sodium chloride 0.9 % injection 10-40 mL  10-40 mL Intracatheter PRN Dolores Patty, MD   10 mL at 03/23/13 0554  . sodium chloride 0.9 % injection 10-40 mL  10-40 mL Intracatheter PRN Dolores Patty, MD   10 mL at 03/22/13 1610  . sodium chloride 0.9 % injection 3 mL  3 mL Intravenous Q12H Dolores Patty, MD   3 mL at 03/18/13 2131  . sodium chloride 0.9 % injection 3 mL  3 mL Intravenous PRN Dolores Patty, MD      . spironolactone (ALDACTONE) tablet 25 mg  25 mg Oral Daily Aundria Rud, NP   25 mg at 03/22/13 1124  . Warfarin - Pharmacist Dosing Inpatient   Does not apply q1800 Severiano Gilbert, [facility]      . zolpidem (AMBIEN) tablet 5 mg  5 mg Oral QHS PRN Aundria Rud, NP         Assessment:   1. Dypnea 2. Chronic biventricular HF Status post HM-II LVAD implantation.  3. PAF  4. Recent VT s/p ICD shock on amio 5. R pleural effusion  6. Transient CHB 7. Chronic renal failure, stage III 8. Recurrent RHF, on dobutamine 9. Anxiety 10. Physical deconditioning 11. Hypokalemia  Plan/Discussion:   CO-OX 65% .  Continue  dobutamine at 6 mcg. Continue 40 mg sildenafil tid. She is on digoxin. (dig level 0.5 03/14/13)  Weight down 3 pounds. Cut back lasix drip 5 mg per hour. Replace K.   INR 1.7. Continue lovenox. Pharmacy dosing coumadin.   VAD parameters look ok. VAD speed increased to 9400 yesterday.   Plan for transfer to [facility] today once bed available.    [name],[name],NP-C 8:17 AM  Patient seen and examined with Tonye Becket, NP. We discussed all aspects of the encounter. I agree with the assessment and plan as stated above.   Feeling better this am. Agree with transfer to [facility] for Transplant eval.  [name] 12:52 PM

## 2013-03-24 NOTE — Consult Note (Signed)
I have reviewed and discussed the care of this patient in detail with the nurse practitioner including pertinent patient records, physical exam findings and data. I agree with details of this encounter.  

## 2013-03-27 ENCOUNTER — Encounter (HOSPITAL_COMMUNITY): Payer: Commercial Managed Care - PPO

## 2013-04-02 DIAGNOSIS — F411 Generalized anxiety disorder: Secondary | ICD-10-CM

## 2013-04-06 ENCOUNTER — Encounter (HOSPITAL_COMMUNITY): Payer: Self-pay | Admitting: *Deleted

## 2013-04-06 ENCOUNTER — Encounter (HOSPITAL_COMMUNITY): Payer: Self-pay | Admitting: Anesthesiology

## 2013-04-09 ENCOUNTER — Encounter (HOSPITAL_COMMUNITY): Payer: Self-pay | Admitting: Anesthesiology

## 2013-04-10 ENCOUNTER — Encounter (HOSPITAL_COMMUNITY): Payer: Self-pay | Admitting: *Deleted

## 2013-04-18 ENCOUNTER — Encounter (HOSPITAL_COMMUNITY): Payer: Self-pay

## 2013-04-18 ENCOUNTER — Ambulatory Visit (HOSPITAL_COMMUNITY)
Admission: RE | Admit: 2013-04-18 | Discharge: 2013-04-18 | Disposition: A | Discharge status: Home / Self Care | Payer: Medicare Other | Source: Ambulatory Visit | Attending: Internal Medicine | Admitting: Internal Medicine

## 2013-04-18 VITALS — BP 92/1 | HR 108 | Ht 63.0 in | Wt 150.8 lb

## 2013-04-18 DIAGNOSIS — I472 Ventricular tachycardia: Secondary | ICD-10-CM

## 2013-04-18 DIAGNOSIS — I50810 Right heart failure, unspecified: Secondary | ICD-10-CM

## 2013-04-18 DIAGNOSIS — I509 Heart failure, unspecified: Secondary | ICD-10-CM

## 2013-04-18 DIAGNOSIS — I4891 Unspecified atrial fibrillation: Secondary | ICD-10-CM

## 2013-04-18 DIAGNOSIS — F329 Major depressive disorder, single episode, unspecified: Secondary | ICD-10-CM

## 2013-04-18 DIAGNOSIS — I5022 Chronic systolic (congestive) heart failure: Secondary | ICD-10-CM | POA: Insufficient documentation

## 2013-04-18 DIAGNOSIS — I471 Supraventricular tachycardia: Secondary | ICD-10-CM

## 2013-04-18 DIAGNOSIS — I480 Paroxysmal atrial fibrillation: Secondary | ICD-10-CM

## 2013-04-18 DIAGNOSIS — Z95811 Presence of heart assist device: Secondary | ICD-10-CM

## 2013-04-18 DIAGNOSIS — Z95818 Presence of other cardiac implants and grafts: Secondary | ICD-10-CM

## 2013-04-18 MED ORDER — TORSEMIDE 20 MG PO TABS: ORAL_TABLET | ORAL | Status: DC

## 2013-04-18 MED ORDER — WARFARIN SODIUM 2.5 MG PO TABS: 2.5000 mg | ORAL_TABLET | Freq: Every day | ORAL | Status: DC

## 2013-04-18 MED ORDER — CITALOPRAM HYDROBROMIDE 40 MG PO TABS: 40.0000 mg | ORAL_TABLET | Freq: Every day | ORAL | Status: DC

## 2013-04-18 MED ORDER — POTASSIUM CHLORIDE ER 10 MEQ PO TBCR: 40.0000 meq | EXTENDED_RELEASE_TABLET | Freq: Every day | ORAL | Status: DC

## 2013-04-18 NOTE — Patient Instructions (Addendum)
Start taking torsemide 20 mg (1 tablet) every Friday. On the day you take torsemide take an extra 10 meq of potassium (5 tablets)  Increase celexa to 40 mg daily (1 tablet)  Call any issues.  F/U 2 weeks  Do the following things EVERYDAY: 1) Weigh yourself in the morning before breakfast. Write it down and keep it in a log. 2) Take your medicines as prescribed 3) Eat low salt foods-Limit salt (sodium) to 2000 mg per day.  4) Stay as active as you can everyday 5) Limit all fluids for the day to less than 2 liters 6)

## 2013-04-18 NOTE — Progress Notes (Signed)
HPI:  Ms. 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 Post-op couse c/b RHF requiring take back to the OR for bleeding and temproray RVAD support.  Admitted 10/12-10/14/14 for fatigue. Concern was for low output due to RHF. Echo with severe RV dysfunction and severe TR. However, RHC with relatively well preserved output.  RA = 17  RV = 35/2/12  PA = 35/15 (22)  PCW = 11  Fick cardiac output/index = 5.4/3.1  Thermo CO/CI = 3.7/2.1  PVR = 2.0 WU (Fick)  FA sat = assumed 98%  PA sat = 66%, 70%, 70%  RA sat = 76%  Recent CT scan of C/A/P relatively unremarkable except for gas around driveline but no obvious infection.   Follow up:  Since last visit was admitted to [facility] for RV failure and started on milrinone, which was then changed to dobutamine. She continued to do poorly and was transferred to [facility] for evaluation for transplant. Discharged Monday and reports that they sent her a letter that she is not transplant candidate currently because of her lungs and kidneys. Her amlodipine, digoxin and hydralazine were discontinued and revatio was decreased to 20 mg TID. When discharged Hgb 7.6. Overall feeling ok. Reports breathing better. Still remains dyspenic with minimal activity from walking to desk from parking garage. Denies CP or palpitations. Weight 147 lbs. Is on dobutamine 5 mcg.   Denies LVAD alarms or ICD shocks.  Reports taking Coumadin as prescribed and adherence to anticoagulation based dietary restrictions.  Denies bright red blood per rectum or melena, no dark urine or hematuria.    Past Medical History  Diagnosis Date  . Paroxysmal supraventricular tachycardia   . Hypokalemia   . Chronic systolic heart failure     a. 07/863 Echo: EF 15%, mod to sev antlat and inflat HK, inf AK, mild to  mod MR, mildly/mod reduced RV fxn, Mod TR, PASP .  [name] History of DVT (deep vein thrombosis)   . Dyslipidemia   . HTN (hypertension)   . Nonischemic cardiomyopathy     a. 06/2012 Echo: EF 15%;  b. 06/2012 Cath: nl except luminal irregs in LAD.  [name] Ventricular tachycardia     a. s/p ICD  . Goiter 2001  . Implantable cardiac defibrillator- Biotronik 2009    Single chamber  . [name] (left bundle branch block)     intermittent  . Palliative care patient     Current Outpatient Prescriptions  Medication Sig Dispense Refill  . acetaminophen (TYLENOL) 325 MG tablet Take 650 mg by mouth every 6 (six) hours as needed for pain.      [name] ALPRAZolam (XANAX) 0.5 MG tablet Take by mouth.      [name] amiodarone (PACERONE) 100 MG tablet Take 1 tablet (100 mg total) by mouth daily.      [name] aspirin EC 325 MG EC tablet Take 1 tablet (325 mg total) by mouth daily.  30 tablet  0  . citalopram (CELEXA) 20 MG tablet Take 1 tablet (20 mg total) by mouth daily.  90 tablet  3  . cyanocobalamin (TH VITAMIN B12) 100 MCG tablet Take by mouth.      . DOBUTamine (DOBUTREX) 4-5 MG/ML-% infusion Inject 420.6 mcg/min into the vein continuous.  250 mL  6  . ferrous fumarate-b12-vitamic  C-folic acid (TRINSICON / FOLTRIN) capsule Take by mouth.      . pantoprazole (PROTONIX) 40 MG tablet Take 1 tablet (40 mg total) by mouth daily.  90 tablet  3  . sildenafil (REVATIO) 20 MG tablet Take 20 mg by mouth 3 (three) times daily.      . simvastatin (ZOCOR) 10 MG tablet Take 1 tablet (10 mg total) by mouth at bedtime.      [name] spironolactone (ALDACTONE) 25 MG tablet Take 1 tablet (25 mg total) by mouth daily.  90 tablet  3  . warfarin (COUMADIN) 2.5 MG tablet Take 1-2 tablets (2.5-5 mg total) by mouth daily. Take 7.5 mg 03/23/13 then resume 2.5mg  every day except 5mg  on Monday, Wednesday, Friday  45 tablet  6  . potassium chloride (K-DUR) 10 MEQ tablet Take 4 tablets (40 mEq total) by mouth daily.  130 tablet  3   No current  facility-administered medications for this encounter.    Review of patient's allergies indicates no known allergies.  REVIEW OF SYSTEMS: All systems negative except as listed in HPI, PMH and Problem list.  LVAD INTERROGATION:  Speed:  9400 Flow:  4.2 Power:  5.2 PI: 4.8 Alarms:  none Events: rare PI events Fixed speed:  9400 Low speed limit: 8800  I reviewed the LVAD parameters from today, and compared the results to the patient's prior recorded data.  No programming changes were made.  The LVAD is functioning within specified parameters.  LVAD interrogation was negative for any significant power changes, alarms or PI events/speed drops.      Filed Vitals:   04/18/13 1405  BP: 92/1  Pulse: 108  Height: 5\' 3"  (1.6 m)  Weight: 150 lb 12.8 oz (68.402 kg)  SpO2: 92%   Physical Exam: GENERAL: Chronically ill appearing, no acute distress, daughter present. HEENT: normal  NECK: Supple, JVP 7-8 with prominent c waves 2+ bilaterally, no bruits.  No lymphadenopathy or thyromegaly appreciated.   CARDIAC:  Mechanical heart sounds with LVAD hum present.  LUNGS:  Clear to auscultation bilaterally.  ABDOMEN:  Soft, round, nontender, positive bowel sounds x4.     LVAD exit site: well-healed and incorporated.  Dressing dry and intact.  No erythema or drainage.  Stabilization device present and accurately applied.  Driveline dressing is being changed weekly per sterile technique. EXTREMITIES:  Warm and dry, no cyanosis, clubbing, rash; trace edema; RUE 2L PICC NEUROLOGIC:  Alert and oriented x 4.  Gait steady.  No aphasia.  No dysarthria.  Affect pleasant.    ASSESSMENT AND PLAN:   1. Chronic systolic HF: NICM, s/p CRT-D and LVAD (12/18/2012) - She just returned from [facility] after being transferred down there from [facility] for evaluation for heart transplant and RVF. She reports she was told she is not a transplant cadidate currently d/t her lungs and kidneys. Reviewed her labs, PFTs and discharge  instructions. Cr stable 1.5 - Continues to have NYHA III symptoms and is very fatigued. She is on home dobutamine at 5 mcg through a PICC at home. Volume status mildly elevated, however she is not on any diuretics. Will restart torsemide 20 mg q Friday with 10 meq of potassium. - MAP slightly elevated will not titrate any medications. - Reinforced the need and importance of daily weights, a low sodium diet, and fluid restriction (less than 2 L a day). Instructed to call the HF clinic if weight increases more than 3 lbs overnight or 5 lbs in a week.  2)  Right Heart Failure -Remains on dobutamine and revatio. As above reviewed labs from [facility]. Continue current regimen. 3) PAF/VT - Currently on 100 mg of Amiodarone. Will continue at current dose. TSH and LFTs from [facility] nl. 4) LVAD - All parameters stable. As above reviewed labs and will continue to have HHRN draw weekly labs on dobutamine. LDH and INR stable.  5) Depression - Patient states she is tired of everything and started crying. Will increase Celexa to 40 mg daily. Will continue to follow and may need to talk with a counselor if interested. Will talk to our Palliative Care NP who developed a relationship with the patient and will ask her if possible at next visit to touch base.   F/U 2 weeks Ulla Potash B NP-C 10:14 PM

## 2013-04-19 ENCOUNTER — Encounter (HOSPITAL_COMMUNITY): Payer: Commercial Managed Care - PPO

## 2013-04-19 ENCOUNTER — Telehealth: Payer: Self-pay | Admitting: *Deleted

## 2013-04-19 DIAGNOSIS — F329 Major depressive disorder, single episode, unspecified: Secondary | ICD-10-CM | POA: Insufficient documentation

## 2013-04-20 ENCOUNTER — Telehealth: Payer: Self-pay | Admitting: *Deleted

## 2013-04-20 ENCOUNTER — Ambulatory Visit (HOSPITAL_COMMUNITY): Payer: Self-pay | Admitting: *Deleted

## 2013-04-20 DIAGNOSIS — Z7901 Long term (current) use of anticoagulants: Secondary | ICD-10-CM

## 2013-04-20 DIAGNOSIS — Z95811 Presence of heart assist device: Secondary | ICD-10-CM

## 2013-04-20 LAB — PROTIME-INR: INR: 1.8 — AB (ref <=1.1)

## 2013-04-20 MED ORDER — WARFARIN SODIUM 5 MG PO TABS: ORAL_TABLET | ORAL | Status: DC

## 2013-04-20 NOTE — Telephone Encounter (Signed)
VAD coordinator contacted pt this morning to inform her Dobutamine refill and home health nurse will be at her home this morning.  Pt denies any CHF symptoms or VAD alarms; is feeling "fine".  VAD coordinator confirmed with [facility] nurse Judeth Cornfield) that dobutamine drip has been re-started and patient is doing well.  Plan for home visit Monday for labs and bag change; lab results will be sent to Dr. Prescott Gum office for review and management.

## 2013-04-20 NOTE — Telephone Encounter (Signed)
Daughter called to report patient "is out" of home dobutamine. She says her mother called her to report her home IV machine is beeping and "has no medicine left".  I asked daughter to contact [facility] - she is waiting for call back from them now.  PharmD from [facility] on phone with myself and daughter - has no bags available now, will place order with [place] pharmacy to prepare at  8 am tomorrow.   Pt was discharged from [facility] Monday 04/16/13 with one additional bag; pt does not have, fear it must have been discarded in error. Dr. Gala Romney updated - instructed pt to remain at home in bed until refill delivered. She is to page VAD pager if any CHF symptoms. Pt and daughter verbalized understanding of same.

## 2013-04-20 NOTE — Telephone Encounter (Signed)
Pt's home health nurse Judeth Cornfield) called to clarify home meds. While reviewing list, nurse found patient has been taking wrong dose of coumadin. She is supposed to be on coumadin 2.5 mg daily except 5 mg Wed/Fri.  She mistook 2.5 mg tablets for 5 mg tablets and has taken the following this week:  Mon - 1.25 mg  Tues - 1.25 mg  Wed - 2.5 mg  Thurs - 1.25 mg  HH nurse checked INR and it was 1.8; reviewed with Ulla Potash, NP. Asked HH nurse to give pt 5 mg today, 2.5 mg tomorrow, and 5 mg Sunday. Will re-check labs Monday 04/23/13. Nurse asked that new Rx be sent to pt pharmacy for Coumadin 5 mg tabs - she will instruct patient on same.

## 2013-04-23 ENCOUNTER — Ambulatory Visit: Payer: Self-pay | Admitting: *Deleted

## 2013-05-01 ENCOUNTER — Other Ambulatory Visit (HOSPITAL_COMMUNITY): Payer: Self-pay | Admitting: *Deleted

## 2013-05-01 DIAGNOSIS — Z7901 Long term (current) use of anticoagulants: Secondary | ICD-10-CM

## 2013-05-01 DIAGNOSIS — Z95811 Presence of heart assist device: Secondary | ICD-10-CM

## 2013-05-02 ENCOUNTER — Ambulatory Visit (HOSPITAL_COMMUNITY): Payer: Self-pay | Admitting: *Deleted

## 2013-05-02 ENCOUNTER — Encounter (HOSPITAL_COMMUNITY): Payer: Self-pay

## 2013-05-02 ENCOUNTER — Ambulatory Visit (HOSPITAL_COMMUNITY)
Admission: RE | Admit: 2013-05-02 | Discharge: 2013-05-02 | Disposition: A | Discharge status: Home / Self Care | Payer: Medicare Other | Source: Ambulatory Visit | Attending: Internal Medicine | Admitting: Internal Medicine

## 2013-05-02 VITALS — BP 100/0 | HR 89 | Wt 161.8 lb

## 2013-05-02 DIAGNOSIS — I5022 Chronic systolic (congestive) heart failure: Secondary | ICD-10-CM | POA: Diagnosis present

## 2013-05-02 DIAGNOSIS — Z95818 Presence of other cardiac implants and grafts: Secondary | ICD-10-CM

## 2013-05-02 DIAGNOSIS — I10 Essential (primary) hypertension: Secondary | ICD-10-CM

## 2013-05-02 DIAGNOSIS — Z95811 Presence of heart assist device: Secondary | ICD-10-CM

## 2013-05-02 DIAGNOSIS — I509 Heart failure, unspecified: Secondary | ICD-10-CM

## 2013-05-02 DIAGNOSIS — D649 Anemia, unspecified: Secondary | ICD-10-CM | POA: Insufficient documentation

## 2013-05-02 DIAGNOSIS — F32A Depression, unspecified: Secondary | ICD-10-CM

## 2013-05-02 DIAGNOSIS — I50810 Right heart failure, unspecified: Secondary | ICD-10-CM

## 2013-05-02 DIAGNOSIS — Z7901 Long term (current) use of anticoagulants: Secondary | ICD-10-CM

## 2013-05-02 DIAGNOSIS — F329 Major depressive disorder, single episode, unspecified: Secondary | ICD-10-CM

## 2013-05-02 LAB — BASIC METABOLIC PANEL
BUN: 17 mg/dL (ref 6–23)
CO2: 18 mEq/L — ABNORMAL LOW (ref 19–32)
Calcium: 9.5 mg/dL (ref 8.4–10.5)
Creatinine, Ser: 1.56 mg/dL — ABNORMAL HIGH (ref 0.50–1.10)
GFR calc Af Amer: 39 mL/min — ABNORMAL LOW (ref >90)
Glucose, Bld: 161 mg/dL — ABNORMAL HIGH (ref 70–99)
Sodium: 139 mEq/L (ref 137–147)

## 2013-05-02 LAB — CBC
HCT: 27 % — ABNORMAL LOW (ref 36.0–46.0)
Hemoglobin: 8.8 g/dL — ABNORMAL LOW (ref 12.0–15.0)
MCH: 28.6 pg (ref 26.0–34.0)
MCV: 87.7 fL (ref 78.0–100.0)
RBC: 3.08 MIL/uL — ABNORMAL LOW (ref 3.87–5.11)
RDW: 19.2 % — ABNORMAL HIGH (ref 11.5–15.5)

## 2013-05-02 MED ORDER — FE FUMARATE-B12-VIT C-FA-IFC PO CAPS: 1.0000 | ORAL_CAPSULE | Freq: Three times a day (TID) | ORAL | Status: DC

## 2013-05-02 MED ORDER — HYDROCHLOROTHIAZIDE 25 MG PO TABS: 25.0000 mg | ORAL_TABLET | Freq: Every day | ORAL | Status: DC

## 2013-05-02 NOTE — Progress Notes (Addendum)
Patient ID: Kaitlin [name], female   DOB: [birth year], 67 y.o.   MRN: [id number] HPI:  [name] is a 67 y/o woman with h/o severe HTN, [name], VT s/p Biotronik ICD (2009) and 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 Post-op couse c/b RHF requiring take back to the OR for bleeding and temproray RVAD support.  Admitted 10/12-10/14/14 for fatigue. Concern was for low output due to RHF. Echo with severe RV dysfunction and severe TR. However, RHC with relatively well preserved output.  RA = 17  RV = 35/2/12  PA = 35/15 (22)  PCW = 11  Fick cardiac output/index = 5.4/3.1  Thermo CO/CI = 3.7/2.1  PVR = 2.0 WU (Fick)  FA sat = assumed 98%  PA sat = 66%, 70%, 70%  RA sat = 76%  Recent CT scan of C/A/P relatively unremarkable except for gas around driveline but no obvious infection.   Follow up:  Last visit started torsemide 20 mg q Friday with extra 10 meq of potassium and increased her Celexa to 40 mg daily. Reports she is feeling more SOB and has been since Christmas day. She took an extra torsemide but reports that did not help. Denies SOB with sitting, but has DOE with minimal movement about 50 ft. Denies CP, orthopnea or edema. Weight at home 147-150 lbs. Has been out of iron.   Denies LVAD alarms or ICD shocks.  Reports taking Coumadin as prescribed and adherence to anticoagulation based dietary restrictions.  Denies bright red blood per rectum or melena, no dark urine or hematuria.    Past Medical History  Diagnosis Date  . Paroxysmal supraventricular tachycardia   . Hypokalemia   . Chronic systolic heart failure     a. 08/979 Echo: EF 15%, mod to sev antlat and inflat HK, inf AK, mild to mod MR, mildly/mod reduced RV fxn, Mod TR, PASP .  [name] History of DVT (deep vein thrombosis)   . Dyslipidemia   . HTN (hypertension)   . Nonischemic cardiomyopathy     a. 06/2012  Echo: EF 15%;  b. 06/2012 Cath: nl except luminal irregs in LAD.  [name] Ventricular tachycardia     a. s/p ICD  . Goiter 2001  . Implantable cardiac defibrillator- Biotronik 2009    Single chamber  . [name] (left bundle branch block)     intermittent  . Palliative care patient     Current Outpatient Prescriptions  Medication Sig Dispense Refill  . acetaminophen (TYLENOL) 325 MG tablet Take 650 mg by mouth every 6 (six) hours as needed for pain.      [name] ALPRAZolam (XANAX) 0.5 MG tablet Take by mouth.      [name] amiodarone (PACERONE) 100 MG tablet Take 1 tablet (100 mg total) by mouth daily.      [name] aspirin EC 325 MG EC tablet Take 1 tablet (325 mg total) by mouth daily.  30 tablet  0  . citalopram (CELEXA) 40 MG tablet Take 1 tablet (40 mg total) by mouth daily.  90 tablet  3  . cyanocobalamin (TH VITAMIN B12) 100 MCG tablet Take by mouth.      . DOBUTamine (DOBUTREX) 4-5 MG/ML-% infusion Inject 420.6 mcg/min into the vein continuous.  250 mL  6  . ferrous fumarate-b12-vitamic C-folic acid (TRINSICON / FOLTRIN) capsule Take by mouth.      . pantoprazole (PROTONIX) 40  MG tablet Take 1 tablet (40 mg total) by mouth daily.  90 tablet  3  . potassium chloride (K-DUR) 10 MEQ tablet Take 4 tablets (40 mEq total) by mouth daily.  130 tablet  3  . sildenafil (REVATIO) 20 MG tablet Take 20 mg by mouth 3 (three) times daily.      . simvastatin (ZOCOR) 10 MG tablet Take 1 tablet (10 mg total) by mouth at bedtime.      [name] spironolactone (ALDACTONE) 25 MG tablet Take 1 tablet (25 mg total) by mouth daily.  90 tablet  3  . torsemide (DEMADEX) 20 MG tablet Take 1 tablet (20 mg) every Friday or as instructed by provider.  8 tablet  1  . warfarin (COUMADIN) 5 MG tablet As directed  30 tablet  6   No current facility-administered medications for this encounter.    Review of patient's allergies indicates no known allergies.  REVIEW OF SYSTEMS: All systems negative except as listed in HPI, PMH and Problem  list.  LVAD INTERROGATION:  Speed:  9400 Flow:  4.5 Power:  5.4 PI: 5.4 Alarms:  none Events: about 1-3 PI events a day Fixed speed:  9400 Low speed limit: 8800  I reviewed the LVAD parameters from today, and compared the results to the patient's prior recorded data.  No programming changes were made.  The LVAD is functioning within specified parameters.  LVAD interrogation was negative for any significant power changes, alarms or PI events/speed drops.     Filed Vitals:   05/02/13 1038  Pulse: 89  Weight: 161 lb 12 oz (73.369 kg)  SpO2: 98%   Physical Exam: GENERAL: Chronically ill appearing, no acute distress, daughter present. HEENT: normal  NECK: Supple, JVP 8-9 with prominent c waves 2+ bilaterally, no bruits.  No lymphadenopathy or thyromegaly appreciated.   CARDIAC:  Mechanical heart sounds with LVAD hum present.  LUNGS:  Clear to auscultation bilaterally.  ABDOMEN:  Soft, round, nontender, mildly distended; positive bowel sounds x4.     LVAD exit site: well-healed and incorporated.  Dressing dry and intact.  No erythema or drainage.  Stabilization device present and accurately applied.  Driveline dressing is being changed weekly per sterile technique. EXTREMITIES:  Warm and dry, no cyanosis, clubbing, rash; trace edema; RUE 2L PICC NEUROLOGIC:  Alert and oriented x 4.  Gait steady.  No aphasia.  No dysarthria.  Affect pleasant.    ASSESSMENT AND PLAN:   1. Chronic systolic HF: NICM, s/p CRT-D and LVAD (12/18/2012) - NYHA IIIb symptoms and volume status mildly elevated. Will start HCTZ 25 mg daily for mild diuresis and for elevated MAP. She may need more of a diuretic, but with chronic RV failure do not want to diurese too quickly. Will have her continue 20 mg torsemide every Friday along with today and tomorrow.  - She is not on BB with RV failure and she is not on ACE-I or Spiro with CRI. - Continue home dobutamine at 5 mcg through a PICC at home.  - She returns to  [facility] on January 21st to reassess patient whether she will be a transplant candidate. They were concerned about kidneys, lungs and finances from what she told us. - Reinforced the need and importance of daily weights, a low sodium diet, and fluid restriction (less than 2 L a day). Instructed to call the HF clinic if weight increases more than 3 lbs overnight or 5 lbs in a week.  2) Right Heart Failure -Remains on  dobutamine and revatio and will continue at current doses. 3) PAF/VT - Currently on 100 mg of Amiodarone. Will continue at current dose. TSH and LFTs from [facility] nl. 4) LVAD - All parameters stable. Rare PI events. Will check CBC, BMET, LDH and INR today. 5) Depression - Last visit increased celexa to 40 mg and she reports she is feeling a little better and is not as upset. Will continue at current dose. 6) Anemia - Last Hgb 7.8 from [facility]. Will check CBC today. Concerned that increased fatigue maybe from low Hgb, if it is will discuss whether we will transfuse. Would like to hold off if at all possible just in case she becomes eligible for transplant to prevent antibodies. Will restart iron 150 mg TID.  F/U 2 weeks Ulla Potash B NP-C 10:52 AM

## 2013-05-02 NOTE — Progress Notes (Signed)
Symptom  Yes  No  Details   Angina        x Activity:   Claudication        x How far:   Syncope        x When:   Stroke        x   Orthopnea        x How many pillows:   1  PND        x How often:  CPAP      N/A How many hrs:   Pedal edema         x    Abd fullness        x   N&V        x   Diaphoresis        x When:  Bleeding       x   Urine color          yellow  SOB        x  Activity:  With any activity  Palpitations        x When:  ICD shock        x   Hospitlizaitons        x When/where/why:  ED visit        x When/where/why:  Other MD        x When/who/why:  Activity    Limited with SOB  Fluid      < 2000 daily  Diet     Low sodium   Vital signs: HR:  89 MAP BP:  O2 Sat: 98 Wt:  161 lbs Last wt: 150 lbs Ht: 5'3"  LVAD interrogation reveals:  Speed: 9400 Flow:  4.5 Power:  5.4 PI: 5.4 Alarms:  none Events:  1 - 2 PI events Fixed speed: 9400 Low speed limit:  8800  LVAD exit site:  Well healed and incorporated. The velour is fully implanted at exit site. Dressing dry and intact. No erythema or drainage. Stabilization device present and accurately applied. Driveline dressing is being changed weekly per sterile technique using  Sorbaview dressing with biopatch on exit site. Pt denies fever or chills. Dressing supplies provided for patient.   VAD dressing removed and site care performed using sterile technique. Drive line exit site cleaned with Chlora prep applicators x 2, allowed to dry, and Sorbaview dressing re-applied. Exit site with complete tissue ingrowth; no redness, tenderness, drainage, or foul odor noted.  Pt/caregiver deny any alarms or VAD equipment issues. VAD coordinator reviewed daily log from home for daily temperature, weight, and VAD parameters. Pt is performing daily controller and system monitor self tests along with completing weekly and monthly maintenance for LVAD equipment.   LVAD equipment check completed and is in good working order.  Back-up equipment present. LVAD education done on emergency procedures and precautions and reviewed exit site care.

## 2013-05-02 NOTE — Patient Instructions (Addendum)
1.  Start Hydrochlorothiazide 25 mg daily 2.  Take Torsemide 20 mg today, tomorrow, and Friday. Then return to Torsemide to 20 mg on Friday only.  3.  Take extra 10 meq potassium on days you take Torsemide.  4.  Return to clinic in 2 weeks

## 2013-05-07 ENCOUNTER — Encounter: Payer: Self-pay | Admitting: Internal Medicine

## 2013-05-08 ENCOUNTER — Other Ambulatory Visit (HOSPITAL_COMMUNITY): Payer: Self-pay

## 2013-05-08 ENCOUNTER — Encounter: Payer: Medicare Other | Admitting: *Deleted

## 2013-05-08 MED ORDER — POTASSIUM CHLORIDE ER 10 MEQ PO TBCR: EXTENDED_RELEASE_TABLET | ORAL | Status: DC

## 2013-05-08 MED ORDER — POTASSIUM CHLORIDE ER 20 MEQ PO TBCR: EXTENDED_RELEASE_TABLET | ORAL | Status: DC

## 2013-05-08 NOTE — Telephone Encounter (Signed)
Attempted to call patient regarding lab work from 05/07/13 and need to increase potassium intake at home, not able to leave voice message due to full mailbox.  Will reattempt to call. [address] [facility]

## 2013-05-09 ENCOUNTER — Other Ambulatory Visit (HOSPITAL_COMMUNITY): Payer: Self-pay | Admitting: *Deleted

## 2013-05-09 ENCOUNTER — Telehealth: Payer: Self-pay | Admitting: *Deleted

## 2013-05-09 DIAGNOSIS — I50810 Right heart failure, unspecified: Secondary | ICD-10-CM

## 2013-05-09 DIAGNOSIS — Z95811 Presence of heart assist device: Secondary | ICD-10-CM

## 2013-05-09 DIAGNOSIS — Z7901 Long term (current) use of anticoagulants: Secondary | ICD-10-CM

## 2013-05-09 NOTE — Telephone Encounter (Signed)
Called patient per Kaitlin Potash, NP based on labs drawn 05/07/13 and instructed her to hold HCTZ today and tomorrow then resume 25 mg daily. She is also to add K-Dur 20 meq in the pm (for total of 60 meq daily). I also notified HH nurse of med changes. Pt verbalized understanding of above and wrote down the instructions. Will re-check BMP next clinic visit 05/16/13.

## 2013-05-16 ENCOUNTER — Encounter (HOSPITAL_COMMUNITY): Payer: Self-pay

## 2013-05-16 ENCOUNTER — Ambulatory Visit (HOSPITAL_COMMUNITY): Payer: Self-pay | Admitting: *Deleted

## 2013-05-16 ENCOUNTER — Ambulatory Visit (HOSPITAL_COMMUNITY)
Admission: RE | Admit: 2013-05-16 | Discharge: 2013-05-16 | Disposition: A | Discharge status: Home / Self Care | Payer: Medicare Other | Source: Ambulatory Visit | Attending: Internal Medicine | Admitting: Internal Medicine

## 2013-05-16 ENCOUNTER — Encounter: Payer: Self-pay | Admitting: *Deleted

## 2013-05-16 VITALS — BP 94/0 | HR 89 | Ht 63.0 in | Wt 148.0 lb

## 2013-05-16 DIAGNOSIS — N189 Chronic kidney disease, unspecified: Secondary | ICD-10-CM | POA: Diagnosis not present

## 2013-05-16 DIAGNOSIS — E785 Hyperlipidemia, unspecified: Secondary | ICD-10-CM | POA: Diagnosis not present

## 2013-05-16 DIAGNOSIS — Z95810 Presence of automatic (implantable) cardiac defibrillator: Secondary | ICD-10-CM | POA: Diagnosis not present

## 2013-05-16 DIAGNOSIS — I129 Hypertensive chronic kidney disease with stage 1 through stage 4 chronic kidney disease, or unspecified chronic kidney disease: Secondary | ICD-10-CM | POA: Diagnosis not present

## 2013-05-16 DIAGNOSIS — Z515 Encounter for palliative care: Secondary | ICD-10-CM | POA: Diagnosis not present

## 2013-05-16 DIAGNOSIS — Z7901 Long term (current) use of anticoagulants: Secondary | ICD-10-CM | POA: Diagnosis not present

## 2013-05-16 DIAGNOSIS — I5022 Chronic systolic (congestive) heart failure: Secondary | ICD-10-CM

## 2013-05-16 DIAGNOSIS — Z86718 Personal history of other venous thrombosis and embolism: Secondary | ICD-10-CM | POA: Diagnosis not present

## 2013-05-16 DIAGNOSIS — I447 Left bundle-branch block, unspecified: Secondary | ICD-10-CM | POA: Diagnosis not present

## 2013-05-16 DIAGNOSIS — Z79899 Other long term (current) drug therapy: Secondary | ICD-10-CM | POA: Insufficient documentation

## 2013-05-16 DIAGNOSIS — I428 Other cardiomyopathies: Secondary | ICD-10-CM | POA: Insufficient documentation

## 2013-05-16 DIAGNOSIS — I10 Essential (primary) hypertension: Secondary | ICD-10-CM

## 2013-05-16 DIAGNOSIS — Z95811 Presence of heart assist device: Secondary | ICD-10-CM

## 2013-05-16 DIAGNOSIS — I509 Heart failure, unspecified: Secondary | ICD-10-CM

## 2013-05-16 DIAGNOSIS — I50810 Right heart failure, unspecified: Secondary | ICD-10-CM

## 2013-05-16 LAB — PROTIME-INR
INR: 2.12 — AB (ref 0.00–1.49)
Prothrombin Time: 23.1 seconds — ABNORMAL HIGH (ref 11.6–15.2)

## 2013-05-16 LAB — CBC
HEMATOCRIT: 32.1 % — AB (ref 36.0–46.0)
HEMOGLOBIN: 10.1 g/dL — AB (ref 12.0–15.0)
MCH: 28.1 pg (ref 26.0–34.0)
MCHC: 31.5 g/dL (ref 30.0–36.0)
MCV: 89.4 fL (ref 78.0–100.0)
PLATELETS: 342 10*3/uL (ref 150–400)
RBC: 3.59 MIL/uL — AB (ref 3.87–5.11)
RDW: 18.8 % — ABNORMAL HIGH (ref 11.5–15.5)
WBC: 9.3 10*3/uL (ref 4.0–10.5)

## 2013-05-16 LAB — COMPREHENSIVE METABOLIC PANEL
ALBUMIN: 4.1 g/dL (ref 3.5–5.2)
ALT: 20 U/L (ref 0–35)
AST: 29 U/L (ref 0–37)
Alkaline Phosphatase: 123 U/L — ABNORMAL HIGH (ref 39–117)
BILIRUBIN TOTAL: 1.7 mg/dL — AB (ref 0.3–1.2)
BUN: 15 mg/dL (ref 6–23)
CO2: 19 mEq/L (ref 19–32)
CREATININE: 1.54 mg/dL — AB (ref 0.50–1.10)
Calcium: 10.4 mg/dL (ref 8.4–10.5)
Chloride: 101 mEq/L (ref 96–112)
GFR calc Af Amer: 40 mL/min — ABNORMAL LOW (ref >90)
GFR calc non Af Amer: 34 mL/min — ABNORMAL LOW (ref >90)
Glucose, Bld: 118 mg/dL — ABNORMAL HIGH (ref 70–99)
Potassium: 4.6 mEq/L (ref 3.7–5.3)
Sodium: 138 mEq/L (ref 137–147)
TOTAL PROTEIN: 7.5 g/dL (ref 6.0–8.3)

## 2013-05-16 LAB — PRO B NATRIURETIC PEPTIDE: PRO B NATRI PEPTIDE: 1338 pg/mL — AB (ref 0–125)

## 2013-05-16 LAB — LACTATE DEHYDROGENASE: LDH: 368 U/L — ABNORMAL HIGH (ref 94–250)

## 2013-05-16 MED ORDER — POTASSIUM CHLORIDE ER 10 MEQ PO TBCR: EXTENDED_RELEASE_TABLET | ORAL | Status: DC

## 2013-05-16 NOTE — Progress Notes (Signed)
Symptom  Yes  No  Details   Angina        x Activity:   Claudication        x How far:   Syncope        x When:   Stroke        x   Orthopnea        x How many pillows:   1  PND        x How often:  CPAP      N/A How many hrs:   Pedal edema               x   Abd fullness        x   N&V        x   Diaphoresis        x When:  Bleeding       x   Urine color          yellow  SOB        x  Activity:  > 50 yds level  Palpitations        x When:  ICD shock        x   Hospitlizaitons        x When/where/why:  ED visit        x When/where/why:  Other MD        x When/who/why:  Activity     Vacuumed today; light household chores, cooking, walking at mall   Fluid      < 2000 daily  Diet     Low sodium   Vital signs: HR:  89 MAP BP: 94 O2 Sat: 96% Wt:   148 lbs Last wt: 148 lbs Ht: 5'3"  LVAD interrogation reveals:  Speed: 9400 Flow:  3.9 Power:  5.1 PI:  4.5 Alarms:  none Events:  Rare PI events Fixed speed: 9400 Low speed limit:  8800  LVAD exit site:  Well healed and incorporated. The velour is fully implanted at exit site. Dressing dry and intact. No erythema or drainage. Stabilization device present and accurately applied. Driveline dressing is being changed weekly per sterile technique using  Sorbaview dressing with biopatch on exit site. Pt denies fever or chills. Dressing supplies provided for patient.   LVAD equipment check completed and is in good working order. Back-up equipment present. LVAD education done on emergency procedures and precautions and reviewed exit site care.

## 2013-05-17 NOTE — Patient Instructions (Signed)
1.  Keep 05/23/13 appt with [facility] 2.  Start cardiac rehab (referral sent) 3.  No med changes 4.  Return to VAD clinic one month

## 2013-05-25 ENCOUNTER — Other Ambulatory Visit (HOSPITAL_COMMUNITY): Payer: Self-pay | Admitting: *Deleted

## 2013-05-25 DIAGNOSIS — Z01818 Encounter for other preprocedural examination: Secondary | ICD-10-CM

## 2013-05-28 ENCOUNTER — Ambulatory Visit (HOSPITAL_COMMUNITY): Payer: Self-pay | Admitting: *Deleted

## 2013-05-28 LAB — PROTIME-INR: INR: 2 — AB (ref <=1.1)

## 2013-05-29 ENCOUNTER — Telehealth (HOSPITAL_COMMUNITY): Payer: Self-pay | Admitting: *Deleted

## 2013-05-29 ENCOUNTER — Ambulatory Visit (HOSPITAL_COMMUNITY)
Admission: RE | Admit: 2013-05-29 | Discharge: 2013-05-29 | Disposition: A | Discharge status: Home / Self Care | Payer: Medicare Other | Source: Ambulatory Visit | Attending: Internal Medicine | Admitting: Internal Medicine

## 2013-05-29 DIAGNOSIS — R05 Cough: Secondary | ICD-10-CM | POA: Insufficient documentation

## 2013-05-29 DIAGNOSIS — R062 Wheezing: Secondary | ICD-10-CM | POA: Insufficient documentation

## 2013-05-29 DIAGNOSIS — R0989 Other specified symptoms and signs involving the circulatory and respiratory systems: Secondary | ICD-10-CM | POA: Insufficient documentation

## 2013-05-29 DIAGNOSIS — Z01818 Encounter for other preprocedural examination: Secondary | ICD-10-CM | POA: Diagnosis present

## 2013-05-29 DIAGNOSIS — R059 Cough, unspecified: Secondary | ICD-10-CM | POA: Diagnosis not present

## 2013-05-29 DIAGNOSIS — R0609 Other forms of dyspnea: Secondary | ICD-10-CM | POA: Diagnosis not present

## 2013-05-29 LAB — PULMONARY FUNCTION TEST
DL/VA % pred: 87 %
DL/VA: 3.84 ml/min/mmHg/L
DLCO cor % pred: 40 %
DLCO cor: 8.13 ml/min/mmHg
DLCO unc % pred: 40 %
DLCO unc: 8.13 ml/min/mmHg
FEF 25-75 Post: 1.87 L/sec
FEF 25-75 Pre: 1.43 L/sec
FEF2575-%CHANGE-POST: 30 %
FEF2575-%Pred-Post: 112 %
FEF2575-%Pred-Pre: 86 %
FEV1-%CHANGE-POST: 10 %
FEV1-%Pred-Post: 62 %
FEV1-%Pred-Pre: 56 %
FEV1-PRE: 0.96 L
FEV1-Post: 1.06 L
FEV1FVC-%Change-Post: 1 %
FEV1FVC-%PRED-PRE: 114 %
FEV6-%Change-Post: 9 %
FEV6-%Pred-Post: 56 %
FEV6-%Pred-Pre: 51 %
FEV6-POST: 1.18 L
FEV6-PRE: 1.07 L
FEV6FVC-%Pred-Post: 104 %
FEV6FVC-%Pred-Pre: 104 %
FVC-%Change-Post: 9 %
FVC-%PRED-PRE: 49 %
FVC-%Pred-Post: 53 %
FVC-Post: 1.18 L
FVC-Pre: 1.07 L
PRE FEV6/FVC RATIO: 100 %
Post FEV1/FVC ratio: 90 %
Post FEV6/FVC ratio: 100 %
Pre FEV1/FVC ratio: 89 %
RV % pred: 0 %
RV: 0.02 L
TLC % pred: 56 %
TLC: 2.6 L

## 2013-05-29 MED ORDER — ALBUTEROL SULFATE (2.5 MG/3ML) 0.083% IN NEBU
2.5000 mg | INHALATION_SOLUTION | Freq: Once | RESPIRATORY_TRACT | Status: AC
  Administered 2013-05-29: 2.5 mg via RESPIRATORY_TRACT

## 2013-05-29 NOTE — Telephone Encounter (Signed)
Called pt per Ulla Potash, NP based on labs reviewed from [facility] yesterday, pt is to hold HCTZ x two days (has already taken today's dose) Wednesday and Thursday of this week. She is then to resume 25 mg daily. Pt verbalized understanding of same. Also confirmed with pt that she is taking torsemide 20 mg on Friday only. Pt is to have PFT's done later today.

## 2013-06-03 NOTE — Addendum Note (Signed)
Encounter addended by: Dolores Patty, MD on: 06/03/2013  6:52 PM<BR>     Documentation filed: Charges VN, Follow-up Section, LOS Section, Problem List, Visit Diagnoses, Notes Section

## 2013-06-03 NOTE — Progress Notes (Addendum)
ADVANCED HF CLINIC NOTE  Subjective: Kaitlin [name] is a 68 y/o woman with h/o severe HTN, [name], VT s/ Biotronik ICD,CKD, severe biventricular HF due to NICM. EF 15-25%  8/18 Underwent HM II LVAD implant (under destination criteria) along with TV repair. Post-op couse c/b RHF requiring take back to the OR for bleeding and temproray RVAD support. Subsequently started on dobutamine for RV support.  Follow-up: Returns for regular f/u. Remains on milrinone. Starting to feel much better. Able to do all ADLs without problem. Beginning to walk more and even doing some mall walking. + dyspnea if she goes too fast. No orthopnea or PND. Edema controlled.  Weight stable. Denies bleeding, melena, fevers, chills or problems with driveline.   LVAD interrogation reveals:  Speed: 9400  Flow: 3.9  Power: 5.1  PI: 4.5  Alarms: none  Events: Rare PI events  Fixed speed: 9400  Low speed limit: 8800   LVAD exit site:  Well healed and incorporated. The velour is fully implanted at exit site. Dressing dry and intact. No erythema or drainage. Stabilization device present and accurately applied. Driveline dressing is being changed weekly per sterile technique using Sorbaview dressing with biopatch on exit site. Pt denies fever or chills. Dressing supplies provided for patient.  LVAD equipment check completed and is in good working order. Back-up equipment present. LVAD education done on emergency procedures and precautions and reviewed exit site care.     ROS: All systems negative except as listed in HPI, PMH and Problem List.  Past Medical History  Diagnosis Date  . Paroxysmal supraventricular tachycardia   . Hypokalemia   . Chronic systolic heart failure     a. 0/0349 Echo: EF 15%, mod to sev antlat and inflat HK, inf AK, mild to mod MR, mildly/mod reduced RV fxn, Mod TR, PASP .  [name] History of DVT (deep vein thrombosis)   . Dyslipidemia   . HTN (hypertension)   . Nonischemic cardiomyopathy     a.  06/2012 Echo: EF 15%;  b. 06/2012 Cath: nl except luminal irregs in LAD.  [name] Ventricular tachycardia     a. s/p ICD  . Goiter 2001  . Implantable cardiac defibrillator- Biotronik 2009    Single chamber  . [name] (left bundle branch block)     intermittent  . Palliative care patient     Current Outpatient Prescriptions  Medication Sig Dispense Refill  . acetaminophen (TYLENOL) 325 MG tablet Take 650 mg by mouth every 6 (six) hours as needed for pain.      [name] amiodarone (PACERONE) 100 MG tablet Take 1 tablet (100 mg total) by mouth daily.      [name] aspirin EC 325 MG EC tablet Take 1 tablet (325 mg total) by mouth daily.  30 tablet  0  . citalopram (CELEXA) 40 MG tablet Take 1 tablet (40 mg total) by mouth daily.  90 tablet  3  . cyanocobalamin (TH VITAMIN B12) 100 MCG tablet Take by mouth.      . DOBUTamine (DOBUTREX) 4-5 MG/ML-% infusion Inject 420.6 mcg/min into the vein continuous.  250 mL  6  . ferrous fumarate-b12-vitamic C-folic acid (TRINSICON / FOLTRIN) capsule Take 1 capsule by mouth 3 (three) times daily after meals.  90 capsule  6  . hydrochlorothiazide (HYDRODIURIL) 25 MG tablet Take 1 tablet (25 mg total) by mouth daily.  30 tablet  3  . pantoprazole (PROTONIX) 40 MG tablet Take 1 tablet (40 mg total) by mouth daily.  90 tablet  3  . sildenafil (REVATIO) 20 MG tablet Take 20 mg by mouth 3 (three) times daily.      . simvastatin (ZOCOR) 10 MG tablet Take 1 tablet (10 mg total) by mouth at bedtime.      [name]. spironolactone (ALDACTONE) 25 MG tablet Take 1 tablet (25 mg total) by mouth daily.  90 tablet  3  . torsemide (DEMADEX) 20 MG tablet Take 1 tablet (20 mg) every Friday or as instructed by provider.  8 tablet  1  . potassium chloride (K-DUR) 10 MEQ tablet Take 40 meq daily  160 tablet  6  . warfarin (COUMADIN) 5 MG tablet As directed  30 tablet  6   No current facility-administered medications for this encounter.     PHYSICAL EXAM: Filed Vitals:   05/16/13 1413  BP: 94/0  Pulse:  89  Height: 5\' 3"  (1.6 m)  Weight: 148 lb (67.132 kg)  SpO2: 96%    General:  Well appearing. No resp difficulty HEENT: normal Neck: supple. JVP 8-10. Carotids 2+ bilaterally; no bruits. No lymphadenopathy or thryomegaly appreciated. Cor: LVAD hum Lungs: clear with decreased BS throughout Abdomen: soft, nontender, nondistended. No hepatosplenomegaly. No bruits or masses. Good bowel sounds. Driveline site looks good. Anchor in place Extremities: no cyanosis, clubbing, rash, tr edema. PICC line site ok  Neuro: alert & orientedx3, cranial nerves grossly intact. Moves all 4 extremities w/o difficulty. Affect pleasant.  ASSESSMENT & PLAN:  1. Chronic biventricular HF s/p HM II LVAD 2. RV failure - requiring dobutamine 3. CKD 4. H/o VT s/p Biotronik ICD  She is MUCH improved with dobutamine. Activity level improving. Volume status look good. Renal function stable with Cr ~1.5. Driveline site looks good. Reinforced need for daily weights and reviewed use of sliding scale diuretics. MAPs stable. Will refer to Cardiac rehab. [facility] and we are hopeful she can be listed for transplant in the near future. VAD parameters stable.   Torsten Weniger,MD 6:50 PM

## 2013-06-06 ENCOUNTER — Ambulatory Visit (HOSPITAL_COMMUNITY): Payer: Self-pay | Admitting: *Deleted

## 2013-06-06 ENCOUNTER — Other Ambulatory Visit (HOSPITAL_COMMUNITY): Payer: Self-pay | Admitting: *Deleted

## 2013-06-06 DIAGNOSIS — I509 Heart failure, unspecified: Secondary | ICD-10-CM

## 2013-06-06 DIAGNOSIS — Z95811 Presence of heart assist device: Secondary | ICD-10-CM

## 2013-06-06 DIAGNOSIS — Z7901 Long term (current) use of anticoagulants: Secondary | ICD-10-CM

## 2013-06-06 LAB — PROTIME-INR: INR: 1.7 — AB (ref <=1.1)

## 2013-06-06 MED ORDER — SIMVASTATIN 10 MG PO TABS: 10.0000 mg | ORAL_TABLET | Freq: Every day | ORAL | Status: DC

## 2013-06-06 MED ORDER — TORSEMIDE 20 MG PO TABS: ORAL_TABLET | ORAL | Status: DC

## 2013-06-12 ENCOUNTER — Other Ambulatory Visit (HOSPITAL_COMMUNITY): Payer: Self-pay

## 2013-06-12 MED ORDER — SILDENAFIL CITRATE 20 MG PO TABS: 20.0000 mg | ORAL_TABLET | Freq: Three times a day (TID) | ORAL | Status: DC

## 2013-06-13 ENCOUNTER — Ambulatory Visit (HOSPITAL_COMMUNITY): Payer: Self-pay | Admitting: *Deleted

## 2013-06-13 ENCOUNTER — Ambulatory Visit (HOSPITAL_COMMUNITY)
Admission: RE | Admit: 2013-06-13 | Discharge: 2013-06-13 | Disposition: A | Discharge status: Home / Self Care | Payer: Medicare Other | Source: Ambulatory Visit | Attending: Cardiology | Admitting: Cardiology

## 2013-06-13 ENCOUNTER — Other Ambulatory Visit (HOSPITAL_COMMUNITY): Payer: Self-pay

## 2013-06-13 VITALS — BP 112/0 | HR 98 | Resp 20 | Wt 150.8 lb

## 2013-06-13 DIAGNOSIS — I472 Ventricular tachycardia, unspecified: Secondary | ICD-10-CM | POA: Insufficient documentation

## 2013-06-13 DIAGNOSIS — Z95811 Presence of heart assist device: Secondary | ICD-10-CM

## 2013-06-13 DIAGNOSIS — I447 Left bundle-branch block, unspecified: Secondary | ICD-10-CM | POA: Diagnosis not present

## 2013-06-13 DIAGNOSIS — J449 Chronic obstructive pulmonary disease, unspecified: Secondary | ICD-10-CM | POA: Diagnosis not present

## 2013-06-13 DIAGNOSIS — Z7901 Long term (current) use of anticoagulants: Secondary | ICD-10-CM

## 2013-06-13 DIAGNOSIS — I10 Essential (primary) hypertension: Secondary | ICD-10-CM

## 2013-06-13 DIAGNOSIS — I4729 Other ventricular tachycardia: Secondary | ICD-10-CM | POA: Insufficient documentation

## 2013-06-13 DIAGNOSIS — J4489 Other specified chronic obstructive pulmonary disease: Secondary | ICD-10-CM | POA: Insufficient documentation

## 2013-06-13 DIAGNOSIS — I5022 Chronic systolic (congestive) heart failure: Secondary | ICD-10-CM | POA: Insufficient documentation

## 2013-06-13 DIAGNOSIS — I129 Hypertensive chronic kidney disease with stage 1 through stage 4 chronic kidney disease, or unspecified chronic kidney disease: Secondary | ICD-10-CM | POA: Insufficient documentation

## 2013-06-13 DIAGNOSIS — I50810 Right heart failure, unspecified: Secondary | ICD-10-CM

## 2013-06-13 DIAGNOSIS — N189 Chronic kidney disease, unspecified: Secondary | ICD-10-CM | POA: Insufficient documentation

## 2013-06-13 DIAGNOSIS — D649 Anemia, unspecified: Secondary | ICD-10-CM

## 2013-06-13 DIAGNOSIS — I509 Heart failure, unspecified: Secondary | ICD-10-CM | POA: Insufficient documentation

## 2013-06-13 LAB — CBC
HEMATOCRIT: 32.7 % — AB (ref 36.0–46.0)
Hemoglobin: 10.5 g/dL — ABNORMAL LOW (ref 12.0–15.0)
MCH: 28.9 pg (ref 26.0–34.0)
MCHC: 32.1 g/dL (ref 30.0–36.0)
MCV: 90.1 fL (ref 78.0–100.0)
Platelets: 285 10*3/uL (ref 150–400)
RBC: 3.63 MIL/uL — AB (ref 3.87–5.11)
RDW: 18.2 % — ABNORMAL HIGH (ref 11.5–15.5)
WBC: 7.8 10*3/uL (ref 4.0–10.5)

## 2013-06-13 LAB — COMPREHENSIVE METABOLIC PANEL
ALK PHOS: 110 U/L (ref 39–117)
ALT: 17 U/L (ref 0–35)
AST: 24 U/L (ref 0–37)
Albumin: 4.1 g/dL (ref 3.5–5.2)
BILIRUBIN TOTAL: 2.1 mg/dL — AB (ref 0.3–1.2)
BUN: 18 mg/dL (ref 6–23)
CHLORIDE: 103 meq/L (ref 96–112)
CO2: 20 meq/L (ref 19–32)
Calcium: 10.2 mg/dL (ref 8.4–10.5)
Creatinine, Ser: 1.35 mg/dL — ABNORMAL HIGH (ref 0.50–1.10)
GFR calc Af Amer: 47 mL/min — ABNORMAL LOW (ref >90)
GFR calc non Af Amer: 40 mL/min — ABNORMAL LOW (ref >90)
GLUCOSE: 98 mg/dL (ref 70–99)
Potassium: 4 mEq/L (ref 3.7–5.3)
Sodium: 138 mEq/L (ref 137–147)
Total Protein: 7.4 g/dL (ref 6.0–8.3)

## 2013-06-13 LAB — PROTIME-INR
INR: 1.62 — ABNORMAL HIGH (ref 0.00–1.49)
Prothrombin Time: 18.8 seconds — ABNORMAL HIGH (ref 11.6–15.2)

## 2013-06-13 LAB — PRO B NATRIURETIC PEPTIDE: Pro B Natriuretic peptide (BNP): 1614 pg/mL — ABNORMAL HIGH (ref 0–125)

## 2013-06-13 LAB — LACTATE DEHYDROGENASE: LDH: 307 U/L — AB (ref 94–250)

## 2013-06-13 MED ORDER — CYANOCOBALAMIN 100 MCG PO TABS: 100.0000 ug | ORAL_TABLET | Freq: Every day | ORAL | Status: DC

## 2013-06-13 MED ORDER — AMIODARONE HCL 100 MG PO TABS: 100.0000 mg | ORAL_TABLET | Freq: Every day | ORAL | Status: DC

## 2013-06-13 MED ORDER — HYDROCHLOROTHIAZIDE 25 MG PO TABS: 25.0000 mg | ORAL_TABLET | Freq: Every day | ORAL | Status: DC

## 2013-06-13 MED ORDER — PANTOPRAZOLE SODIUM 40 MG PO TBEC: 40.0000 mg | DELAYED_RELEASE_TABLET | Freq: Every day | ORAL | Status: AC

## 2013-06-13 MED ORDER — FERROUS SULFATE 324 (65 FE) MG PO TBEC: 1.0000 | DELAYED_RELEASE_TABLET | Freq: Three times a day (TID) | ORAL | Status: DC

## 2013-06-13 MED ORDER — WARFARIN SODIUM 5 MG PO TABS: ORAL_TABLET | ORAL | Status: DC

## 2013-06-13 MED ORDER — ASPIRIN 325 MG PO TBEC: 325.0000 mg | DELAYED_RELEASE_TABLET | Freq: Every day | ORAL | Status: DC

## 2013-06-13 MED ORDER — TORSEMIDE 20 MG PO TABS: ORAL_TABLET | ORAL | Status: DC

## 2013-06-13 MED ORDER — SILDENAFIL CITRATE 20 MG PO TABS: 20.0000 mg | ORAL_TABLET | Freq: Three times a day (TID) | ORAL | Status: DC

## 2013-06-13 MED ORDER — SPIRONOLACTONE 25 MG PO TABS: 25.0000 mg | ORAL_TABLET | Freq: Every day | ORAL | Status: DC

## 2013-06-13 MED ORDER — ACETAMINOPHEN 325 MG PO TABS: 650.0000 mg | ORAL_TABLET | Freq: Four times a day (QID) | ORAL | Status: DC | PRN

## 2013-06-13 MED ORDER — CITALOPRAM HYDROBROMIDE 40 MG PO TABS: 40.0000 mg | ORAL_TABLET | Freq: Every day | ORAL | Status: DC

## 2013-06-13 MED ORDER — POTASSIUM CHLORIDE ER 10 MEQ PO TBCR: EXTENDED_RELEASE_TABLET | ORAL | Status: DC

## 2013-06-13 MED ORDER — SIMVASTATIN 10 MG PO TABS: 10.0000 mg | ORAL_TABLET | Freq: Every day | ORAL | Status: AC

## 2013-06-13 NOTE — Patient Instructions (Signed)
1.  Take torsemide 20 mg today; then 20 mg on every Monday and Friday 2.  Start Lovenox  60 mg mg injections twice daily; take first dose this evening. 3.  Take coumadin 5 mg today and 5 mg tomorrow evening. 4.  INR check Friday by Home Health nurse  5.  Return to clinic 3 weeks.

## 2013-06-13 NOTE — Progress Notes (Signed)
Symptom  Yes  No  Details   Angina        x Activity:   Claudication        x How far:   Syncope        x When:   Stroke        x   Orthopnea        x How many pillows:   1  PND        x How often:  CPAP      N/A How many hrs:   Pedal edema         x   clears overnight  Abd fullness         x        x 2 weeks  N&V        x   Diaphoresis        x When:  Bleeding       x   Urine color          yellow  SOB        x  Activity:  With increased abdomin x 2 weeks  Palpitations        x When:  ICD shock        x   Hospitlizaitons        x When/where/why:  ED visit        x When/where/why:  Other MD        x When/who/why:  Dr. Aundria Rud at [facility]  Activity    Limited with SOB  Fluid      < 2000 daily  Diet     Low sodium   Vital signs: HR:  98 MAP BP:   112; re-checked 100 O2 Sat: 99 Wt:  150.12  lbs Last wt: 148  lbs Ht: 5'3"  LVAD interrogation reveals:  Speed: 9400 Flow:  3.8 Power:  5..0 PI: 5.0 Alarms:  none Events:  2 - 4 PI events Fixed speed: 9400 Low speed limit:  8400  LVAD exit site:  Well healed and incorporated. The velour is fully implanted at exit site. Dressing dry and intact. No erythema or drainage. Stabilization device present and accurately applied. Driveline dressing is being changed weekly per sterile technique using  Sorbaview dressing with biopatch on exit site. Pt denies fever or chills. Dressing supplies provided for patient.   VAD dressing removed and site care performed using sterile technique. Drive line exit site cleaned with Chlora prep applicators x 2, allowed to dry, and Sorbaview dressing re-applied. Exit site with complete tissue ingrowth; no redness, tenderness, drainage, or foul odor noted.  Pt/caregiver deny any alarms or VAD equipment issues. VAD coordinator reviewed daily log from home for daily temperature, weight, and VAD parameters. Pt is performing daily controller and system monitor self tests along with completing weekly and monthly  maintenance for LVAD equipment.   LVAD equipment check completed and is in good working order. Back-up equipment present. LVAD education done on emergency procedures and precautions and reviewed exit site care.   Pt completed 6 mos Intermacs QOL, KCCQ, and neurocognitive assessment. Pt attempted 6 min walk and completed 200 feet in 2 minutes; she had to stop due to SOB.

## 2013-06-14 LAB — PREALBUMIN: Prealbumin: 21.1 mg/dL (ref 17.0–34.0)

## 2013-06-15 ENCOUNTER — Ambulatory Visit (HOSPITAL_COMMUNITY): Payer: Self-pay | Admitting: *Deleted

## 2013-06-15 ENCOUNTER — Other Ambulatory Visit (HOSPITAL_COMMUNITY): Payer: Self-pay | Admitting: Anesthesiology

## 2013-06-15 LAB — PROTIME-INR: INR: 1.6 — AB (ref <=1.1)

## 2013-06-16 DIAGNOSIS — I5023 Acute on chronic systolic (congestive) heart failure: Secondary | ICD-10-CM

## 2013-06-16 DIAGNOSIS — I10 Essential (primary) hypertension: Secondary | ICD-10-CM

## 2013-06-16 DIAGNOSIS — Z452 Encounter for adjustment and management of vascular access device: Secondary | ICD-10-CM

## 2013-06-16 DIAGNOSIS — I509 Heart failure, unspecified: Secondary | ICD-10-CM

## 2013-06-18 ENCOUNTER — Ambulatory Visit (HOSPITAL_COMMUNITY): Payer: Self-pay | Admitting: *Deleted

## 2013-06-18 LAB — PROTIME-INR: INR: 3.1 — AB (ref <=1.1)

## 2013-06-18 NOTE — Progress Notes (Signed)
ADVANCED HF CLINIC NOTE  Subjective: Kaitlin [name] is a 68 y/o woman with h/o severe HTN, [name], VT s/ Biotronik ICD,CKD (Baseline Cr 1.5-1.6) , severe biventricular HF due to NICM. EF 15-25%  8/18 Underwent HM II LVAD implant (under destination criteria) along with TV repair. Post-op couse c/b RHF requiring take back to the OR for bleeding and temproray RVAD support. Subsequently started on dobutamine for RV support.  Follow-up: Returns for regular f/u. Remains on dobutamine 5 mcg. C/O abdominal "bloating" over last 1 - [redacted] weeks along with increased SOB. Activity level has diminished over last few weeks due to SOB. No orthopnea or PND. Edema controlled.  Weight stable. Denies bleeding, melena, fevers, chills or problems with driveline.   Recent PFTs with severe airflow limitation FEV1 < 1.0L  LVAD interrogation reveals:  Speed: 9400  Flow: 3.8 Power: 5.0 PI: 5.0  Alarms: none  Events: 2 - 4 PI events Fixed speed: 9400  Low speed limit: 8800   LVAD exit site:  Well healed and incorporated. The velour is fully implanted at exit site. Dressing dry and intact. No erythema or drainage. Stabilization device present and accurately applied. Driveline dressing is being changed weekly per sterile technique using Sorbaview dressing with biopatch on exit site. Pt denies fever or chills. Dressing supplies provided for patient.   LVAD equipment check completed and is in good working order. Back-up equipment present. LVAD education done on emergency procedures and precautions and reviewed exit site care.     ROS: All systems negative except as listed in HPI, PMH and Problem List.  Past Medical History  Diagnosis Date  . Paroxysmal supraventricular tachycardia   . Hypokalemia   . Chronic systolic heart failure     a. 07/4915 Echo: EF 15%, mod to sev antlat and inflat HK, inf AK, mild to mod MR, mildly/mod reduced RV fxn, Mod TR, PASP .  [name] History of DVT (deep vein thrombosis)   .  Dyslipidemia   . HTN (hypertension)   . Nonischemic cardiomyopathy     a. 06/2012 Echo: EF 15%;  b. 06/2012 Cath: nl except luminal irregs in LAD.  [name] Ventricular tachycardia     a. s/p ICD  . Goiter 2001  . Implantable cardiac defibrillator- Biotronik 2009    Single chamber  . [name] (left bundle branch block)     intermittent  . Palliative care patient     Current Outpatient Prescriptions  Medication Sig Dispense Refill  . acetaminophen (TYLENOL) 325 MG tablet Take 2 tablets (650 mg total) by mouth every 6 (six) hours as needed.  20 tablet  2  . amiodarone (PACERONE) 100 MG tablet Take 1 tablet (100 mg total) by mouth daily.  30 tablet  2  . aspirin 325 MG EC tablet Take 1 tablet (325 mg total) by mouth daily.  30 tablet  2  . citalopram (CELEXA) 40 MG tablet Take 1 tablet (40 mg total) by mouth daily.  90 tablet  3  . cyanocobalamin (TH VITAMIN B12) 100 MCG tablet Take 1 tablet (100 mcg total) by mouth daily.  30 tablet  2  . DOBUTamine (DOBUTREX) 4-5 MG/ML-% infusion Inject 420.6 mcg/min into the vein continuous.  250 mL  6  . hydrochlorothiazide (HYDRODIURIL) 25 MG tablet Take 1 tablet (25 mg total) by mouth daily.  30 tablet  3  . pantoprazole (PROTONIX) 40 MG tablet Take 1 tablet (40 mg total) by mouth daily.  90 tablet  3  . potassium chloride (  K-DUR) 10 MEQ tablet Take 40 meq daily  120 tablet  2  . sildenafil (REVATIO) 20 MG tablet Take 1 tablet (20 mg total) by mouth 3 (three) times daily.  90 tablet  2  . simvastatin (ZOCOR) 10 MG tablet Take 1 tablet (10 mg total) by mouth at bedtime.  30 tablet  2  . spironolactone (ALDACTONE) 25 MG tablet Take 1 tablet (25 mg total) by mouth daily.  30 tablet  2  . warfarin (COUMADIN) 5 MG tablet As directed  30 tablet  2  . ferrous sulfate 324 (65 FE) MG TBEC Take 1 tablet (325 mg total) by mouth 3 (three) times daily.  90 tablet  6  . torsemide (DEMADEX) 20 MG tablet Take 1 tab every Mon and Fri  10 tablet  6   No current  facility-administered medications for this encounter.     PHYSICAL EXAM: Filed Vitals:   06/13/13 1424  BP: 112/0  Pulse: 98  Resp: 20  Weight: 150 lb 12 oz (68.38 kg)  SpO2: 99%    General:  Well appearing. No resp difficulty HEENT: normal Neck: supple. JVP 8-10. Carotids 2+ bilaterally; no bruits. No lymphadenopathy or thryomegaly appreciated. Cor: LVAD hum Lungs: clear with decreased BS throughout Abdomen: soft, nontender, nondistended. No hepatosplenomegaly. No bruits or masses. Good bowel sounds. Driveline site looks good. Anchor in place Extremities: no cyanosis, clubbing, rash, tr edema. PICC line site ok  Neuro: alert & orientedx3, cranial nerves grossly intact. Moves all 4 extremities w/o difficulty. Affect pleasant.  ASSESSMENT & PLAN:  1. Chronic biventricular HF s/p HM II LVAD.  NYHA Class III despite LVAD and dobutamine support 2. RV failure - requiring dobutamine 3. CKD - stable 4. H/o VT s/p Biotronik ICD 5. COPD - severe  Doing OK. NYHA III. Volume status mildly elevated. Renal function stable with Cr ~1.35. Driveline site looks good. Reinforced need for daily weights and reviewed use of sliding scale diuretics. MAP today 112, re-checked 100.  PFT's recently repeated and show severe airflow limitation - will need Pulmonary consult.  She had f[facility]u with [facility] and saw Dr. Aundria Rudogers for transplant consideration. VAD parameters stable. Will have her take demadex 20mg  twice per week.    Attempted 6 min walk today where she completed 200 feet in 2 minutes, but had to stop due to SOB.  Given severe COPD on PFTs, I suspect she will not be transplant candidate.  Daniel BensimhonMD 11:13 PM

## 2013-06-26 ENCOUNTER — Other Ambulatory Visit (HOSPITAL_COMMUNITY): Payer: Self-pay | Admitting: *Deleted

## 2013-06-26 DIAGNOSIS — Z95811 Presence of heart assist device: Secondary | ICD-10-CM

## 2013-06-26 DIAGNOSIS — Z7901 Long term (current) use of anticoagulants: Secondary | ICD-10-CM

## 2013-06-27 ENCOUNTER — Ambulatory Visit (HOSPITAL_COMMUNITY)
Admission: RE | Admit: 2013-06-27 | Discharge: 2013-06-27 | Disposition: A | Discharge status: Home / Self Care | Payer: Medicare Other | Source: Ambulatory Visit | Attending: Internal Medicine | Admitting: Internal Medicine

## 2013-06-27 ENCOUNTER — Ambulatory Visit (HOSPITAL_COMMUNITY): Payer: Self-pay | Admitting: *Deleted

## 2013-06-27 VITALS — BP 118/0 | HR 89 | Wt 147.8 lb

## 2013-06-27 DIAGNOSIS — I5022 Chronic systolic (congestive) heart failure: Secondary | ICD-10-CM

## 2013-06-27 DIAGNOSIS — Z95811 Presence of heart assist device: Secondary | ICD-10-CM | POA: Diagnosis present

## 2013-06-27 DIAGNOSIS — Z7901 Long term (current) use of anticoagulants: Secondary | ICD-10-CM

## 2013-06-27 DIAGNOSIS — I509 Heart failure, unspecified: Secondary | ICD-10-CM

## 2013-06-27 DIAGNOSIS — I10 Essential (primary) hypertension: Secondary | ICD-10-CM | POA: Diagnosis not present

## 2013-06-27 DIAGNOSIS — I50810 Right heart failure, unspecified: Secondary | ICD-10-CM

## 2013-06-27 LAB — LACTATE DEHYDROGENASE: LDH: 280 U/L — ABNORMAL HIGH (ref 94–250)

## 2013-06-27 LAB — BASIC METABOLIC PANEL
BUN: 20 mg/dL (ref 6–23)
CHLORIDE: 102 meq/L (ref 96–112)
CO2: 23 mEq/L (ref 19–32)
Calcium: 10 mg/dL (ref 8.4–10.5)
Creatinine, Ser: 1.55 mg/dL — ABNORMAL HIGH (ref 0.50–1.10)
GFR calc Af Amer: 39 mL/min — ABNORMAL LOW (ref >90)
GFR, EST NON AFRICAN AMERICAN: 34 mL/min — AB (ref >90)
GLUCOSE: 134 mg/dL — AB (ref 70–99)
POTASSIUM: 4.2 meq/L (ref 3.7–5.3)
SODIUM: 141 meq/L (ref 137–147)

## 2013-06-27 LAB — CBC
HEMATOCRIT: 32.1 % — AB (ref 36.0–46.0)
Hemoglobin: 10.4 g/dL — ABNORMAL LOW (ref 12.0–15.0)
MCH: 29.1 pg (ref 26.0–34.0)
MCHC: 32.4 g/dL (ref 30.0–36.0)
MCV: 89.9 fL (ref 78.0–100.0)
Platelets: 278 10*3/uL (ref 150–400)
RBC: 3.57 MIL/uL — ABNORMAL LOW (ref 3.87–5.11)
RDW: 18.2 % — ABNORMAL HIGH (ref 11.5–15.5)
WBC: 7.7 10*3/uL (ref 4.0–10.5)

## 2013-06-27 LAB — PROTIME-INR
INR: 2.55 — AB (ref 0.00–1.49)
PROTHROMBIN TIME: 26.6 s — AB (ref 11.6–15.2)

## 2013-06-27 MED ORDER — AMLODIPINE BESYLATE 5 MG PO TABS: 5.0000 mg | ORAL_TABLET | Freq: Every day | ORAL | Status: DC

## 2013-06-27 NOTE — Progress Notes (Signed)
Patient ID: Delaine LameJacqueline C Pottenger, female   DOB: October 12, 1945, 68 y.o.   MRN: [id number]   ADVANCED HF CLINIC NOTE  Subjective: Ms. Thomasena EdisCollins is a 68 y/o woman with h/o severe HTN, [name], VT s/ Biotronik ICD,CKD (Baseline Cr 1.5-1.6) , severe biventricular HF due to NICM. EF 15-25%  8/18 Underwent HM II LVAD implant (under destination criteria) along with TV repair. Post-op couse c/b RHF requiring take back to the OR for bleeding and temproray RVAD support. Subsequently started on dobutamine for RV support.  Recent PFTs with severe airflow limitation FEV1 < 1.0L  06/13/13 6MW completed 200 feet in 2 minutes, but had to stop due to SOB  She returns for follow up.  Remains on dobutamine 5 mcg. Mild dyspnea when walking but she thinks it is getting better. Able to perform ADLs without difficulty. Now able to go to the grocery store. Denies PND/Orthopnea/chest pain/dizziness. Weight at home 143-147 pounds. She has not required additional torsemide.  Compliant with meds.  Denies bleeding, melena, fevers, chills or problems with driveline.   Labs 06/27/13 LDH: 280 Creatinine 1.55 K 4.2 Hemoglobin 10.4 INR 2.55   LVAD interrogation reveals:  Speed: 9400  Flow: 4.4 Power: 5.3 PI: 5.0  Alarms: none  Events: 1-3 PI events per day Fixed speed: 9400  Low speed limit: 8800   LVAD exit site:  Well healed and incorporated. The velour is fully implanted at exit site. Dressing dry and intact. No erythema or drainage. Stabilization device present and accurately applied. Driveline dressing is being changed weekly per sterile technique using Sorbaview dressing with biopatch on exit site. Pt denies fever or chills. Dressing supplies provided for patient.   LVAD equipment check completed and is in good working order. Back-up equipment present. LVAD education done on emergency procedures and precautions and reviewed exit site care.     ROS: All systems negative except as listed in HPI, PMH and Problem List.  Past  Medical History  Diagnosis Date  . Paroxysmal supraventricular tachycardia   . Hypokalemia   . Chronic systolic heart failure     a. 7/82952/2014 Echo: EF 15%, mod to sev antlat and inflat HK, inf AK, mild to mod MR, mildly/mod reduced RV fxn, Mod TR, PASP 46mmHg.  [name]. History of DVT (deep vein thrombosis)   . Dyslipidemia   . HTN (hypertension)   . Nonischemic cardiomyopathy     a. 06/2012 Echo: EF 15%;  b. 06/2012 Cath: nl except luminal irregs in LAD.  [name]. Ventricular tachycardia     a. s/p ICD  . Goiter 2001  . Implantable cardiac defibrillator- Biotronik 2009    Single chamber  . [name] (left bundle branch block)     intermittent  . Palliative care patient     Current Outpatient Prescriptions  Medication Sig Dispense Refill  . acetaminophen (TYLENOL) 325 MG tablet Take 2 tablets (650 mg total) by mouth every 6 (six) hours as needed.  20 tablet  2  . amiodarone (PACERONE) 100 MG tablet Take 1 tablet (100 mg total) by mouth daily.  30 tablet  2  . aspirin 325 MG EC tablet Take 1 tablet (325 mg total) by mouth daily.  30 tablet  2  . citalopram (CELEXA) 40 MG tablet Take 1 tablet (40 mg total) by mouth daily.  90 tablet  3  . cyanocobalamin (TH VITAMIN B12) 100 MCG tablet Take 1 tablet (100 mcg total) by mouth daily.  30 tablet  2  . DOBUTamine (DOBUTREX) 4-5 MG/ML-%  infusion Inject 420.6 mcg/min into the vein continuous.  250 mL  6  . ferrous sulfate 324 (65 FE) MG TBEC Take 1 tablet (325 mg total) by mouth 3 (three) times daily.  90 tablet  6  . hydrochlorothiazide (HYDRODIURIL) 25 MG tablet Take 1 tablet (25 mg total) by mouth daily.  30 tablet  3  . pantoprazole (PROTONIX) 40 MG tablet Take 1 tablet (40 mg total) by mouth daily.  90 tablet  3  . potassium chloride (K-DUR) 10 MEQ tablet Take 40 meq daily  120 tablet  2  . sildenafil (REVATIO) 20 MG tablet Take 1 tablet (20 mg total) by mouth 3 (three) times daily.  90 tablet  2  . simvastatin (ZOCOR) 10 MG tablet Take 1 tablet (10 mg total)  by mouth at bedtime.  30 tablet  2  . spironolactone (ALDACTONE) 25 MG tablet Take 1 tablet (25 mg total) by mouth daily.  30 tablet  2  . torsemide (DEMADEX) 20 MG tablet Take 1 tab every Mon and Fri  10 tablet  6  . warfarin (COUMADIN) 5 MG tablet As directed  30 tablet  2   No current facility-administered medications for this encounter.     PHYSICAL EXAM: Filed Vitals:   06/27/13 1419  BP: 118/0  Pulse: 89  Weight: 147 lb 12.8 oz (67.042 kg)  SpO2: 95%  MAP 118   General:  Well appearing. No resp difficulty Daughter present  HEENT: normal Neck: supple. JVP ~10. Carotids 2+ bilaterally; no bruits. No lymphadenopathy or thryomegaly appreciated. Cor: LVAD hum Lungs: clear with decreased BS throughout Abdomen: soft, nontender, nondistended. No hepatosplenomegaly. No bruits or masses. Good bowel sounds. Driveline site looks good. Anchor in place Extremities: no cyanosis, clubbing, rash, tr edema. PICC line site ok  Neuro: alert & orientedx3, cranial nerves grossly intact. Moves all 4 extremities w/o difficulty. Affect pleasant.  ASSESSMENT & PLAN:  1. Chronic biventricular HF s/p HM II LVAD.  NYHA Class III despite LVAD and dobutamine support 2. RV failure - requiring dobutamine 3. CKD - stable 4. H/O VT s/p Biotronik ICD 5. COPD - severe   NYHA II-II Volume status mildly elevated. Continue current diuretic regimen with Torsemide 20 mg twice a week,  Spironolactone 25 mg daily, and HCTZ  25 mg daily. Renal function stable.  Continue dobutamine at 5 mcg     MAP elevated 118 . Add 5 mg amlodipine.   VAD parameters stable.    Follow up in 1 month   [name],[name] NP-C   Patient seen and examined with Tonye Becket, NP. We discussed all aspects of the encounter. I agree with the assessment and plan as stated above.   Overall she continues to improve. Functional class II-III on IV dobutamine for RV support. Neck veins up a bit but likely needs the volume given her RV. Will continue  current dose of diuretics. MAPs are up so add amlodipine 5 daily. Encouraged her to walk on treadmill 10 mins 2x per day. Will refer to Pulmonary to optimize her regimen. Not eligible for pulmonary rehab while receiving HHRN. Main impediment to transplant now is likely her PFTs. We will do our best to optimize her and repeat in a month or two. Await Pulmonary input.  VAD parameters look good.  Reuel Boom Terrez Ander,MD 8:41 PM

## 2013-06-27 NOTE — Patient Instructions (Signed)
1.  Start Norvasc 5 mg daily for blood pressure 2.  No change in coumadin dose 3.  Will ask pulmonology to see you. 4.  Return to VAD clinic one month 5.  Re-check INR 07/09/13 per HH 6.  Start on treadmill 10 minutes twice daily and increase as possible

## 2013-07-04 ENCOUNTER — Telehealth: Payer: Self-pay | Admitting: *Deleted

## 2013-07-04 NOTE — Telephone Encounter (Signed)
Called patient per Dr. Gala Romney - referral to Dr. Kendrick Fries (pulmonology) made for 07/09/13 at 3:30 pm.  Informed pt that Dr. Ulyses Jarred office will be calling her to confirm appt, pt verbalized understanding of same.

## 2013-07-06 ENCOUNTER — Encounter: Payer: Self-pay | Admitting: Internal Medicine

## 2013-07-09 ENCOUNTER — Encounter: Payer: Self-pay | Admitting: Pulmonary Disease

## 2013-07-09 ENCOUNTER — Ambulatory Visit (HOSPITAL_COMMUNITY): Payer: Self-pay | Admitting: *Deleted

## 2013-07-09 ENCOUNTER — Ambulatory Visit (INDEPENDENT_AMBULATORY_CARE_PROVIDER_SITE_OTHER): Payer: Medicare Other | Admitting: Pulmonary Disease

## 2013-07-09 VITALS — HR 83 | Temp 98.7°F | Resp 16 | Ht 63.0 in | Wt 155.0 lb

## 2013-07-09 DIAGNOSIS — J984 Other disorders of lung: Secondary | ICD-10-CM

## 2013-07-09 DIAGNOSIS — J9610 Chronic respiratory failure, unspecified whether with hypoxia or hypercapnia: Secondary | ICD-10-CM | POA: Insufficient documentation

## 2013-07-09 LAB — PROTIME-INR: INR: 2.7 — AB (ref <=1.1)

## 2013-07-09 MED ORDER — LEVALBUTEROL TARTRATE 45 MCG/ACT IN AERO: 2.0000 | INHALATION_SPRAY | RESPIRATORY_TRACT | Status: DC | PRN

## 2013-07-09 NOTE — Progress Notes (Signed)
Subjective:    Patient ID: Kaitlin [name], female    DOB: [birth year], 68 y.o.   MRN: [id number]  HPI  Kaitlin [name] has been referred to us for evaluation of chronic respiratory faliure in the setting of chronic systolic heart failure.  She has recently had an LVAD placed.  She has been told that her pulmonary function tests are abnormal so she needs a diagnosis and anything we can do to help.  She has never been told that she has a lung problem until she had an abnormal pulmonary function test as part of a heart transplant work up at [facility] recently.  She used to smoke 20-30 years ago, and said that she smoked only on the weekends.  She thinks that she did this for approximately 2 years only when in a social situation.    She developed non-ischemic cardiomyopathy and five years ago she had an ICD placed.  She was treated with multiple medicines but she doesn't recall the names of them prior.  She has been on amiodarone since 12/2013 as part of having the LVAD.  She has a cough all day and worse in the evenings.  She occasionally brings up sputum but she never looks at it.  It is typically dry.  She denies sinus symptoms.  She doesn't have heartburn or acid reflux symptoms.  In August she had [facility].  She has had a great deal of right heart failure since this study.  She noted that immediately after surgery she had a lot of pain which limited her ability to take a deep breath.  Past Medical History  Diagnosis Date  . Paroxysmal supraventricular tachycardia   . Hypokalemia   . Chronic systolic heart failure     a. 0/10272/2014 Echo: EF 15%, mod to sev antlat and inflat HK, inf AK, mild to mod MR, mildly/mod reduced RV fxn, Mod TR, PASP 46mmHg.  [name]. History of DVT (deep vein thrombosis)   . Dyslipidemia   . HTN (hypertension)   . Nonischemic cardiomyopathy     a. 06/2012 Echo: EF 15%;  b. 06/2012 Cath: nl except luminal irregs in LAD.  [name]. Ventricular tachycardia    a. s/p ICD  . Goiter 2001  . Implantable cardiac defibrillator- Biotronik 2009    Single chamber  . [name] (left bundle branch block)     intermittent  . Palliative care patient      Family History  Problem Relation Age of Onset  . Emphysema Mother   . Heart disease Mother   . Heart attack Father   . Emphysema Brother      History   Social History  . Marital Status: Single    Spouse Name: N/A    Number of Children: N/A  . Years of Education: N/A   Occupational History  . rservations Coordinator at [employer]    Social History Main Topics  . Smoking status: Never Smoker   . Smokeless tobacco: Never Used     Comment: Exposed to secondhand smoke from both parents as a child  . Alcohol Use: No  . Drug Use: No  . Sexual Activity: Not on file   Other Topics Concern  . 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 [place], not married. 2 daughters/            No Known Allergies   Outpatient Prescriptions Prior to Visit  Medication Sig Dispense Refill  . acetaminophen (TYLENOL) 325 MG tablet Take 2 tablets (650 mg total) by mouth every 6 (six) hours as needed.  20 tablet  2  . amiodarone (PACERONE) 100 MG tablet Take 1 tablet (100 mg total) by mouth daily.  30 tablet  2  . amLODipine (NORVASC) 5 MG tablet Take 1 tablet (5 mg total) by mouth daily.  30 tablet  6  . aspirin 325 MG EC tablet Take 1 tablet (325 mg total) by mouth daily.  30 tablet  2  . citalopram (CELEXA) 40 MG tablet Take 1 tablet (40 mg total) by mouth daily.  90 tablet  3  . cyanocobalamin (TH VITAMIN B12) 100 MCG tablet Take 1 tablet (100 mcg total) by mouth daily.  30 tablet  2  . DOBUTamine (DOBUTREX) 4-5 MG/ML-% infusion Inject 420.6 mcg/min into the vein continuous.  250 mL  6  . ferrous sulfate 324 (65 FE) MG TBEC Take 1 tablet (325 mg total) by mouth 3 (three) times daily.  90 tablet  6  . hydrochlorothiazide (HYDRODIURIL) 25 MG tablet Take 1  tablet (25 mg total) by mouth daily.  30 tablet  3  . pantoprazole (PROTONIX) 40 MG tablet Take 1 tablet (40 mg total) by mouth daily.  90 tablet  3  . potassium chloride (K-DUR) 10 MEQ tablet Take 40 meq daily  120 tablet  2  . sildenafil (REVATIO) 20 MG tablet Take 1 tablet (20 mg total) by mouth 3 (three) times daily.  90 tablet  2  . simvastatin (ZOCOR) 10 MG tablet Take 1 tablet (10 mg total) by mouth at bedtime.  30 tablet  2  . spironolactone (ALDACTONE) 25 MG tablet Take 1 tablet (25 mg total) by mouth daily.  30 tablet  2  . torsemide (DEMADEX) 20 MG tablet Take 1 tab every Mon and Fri  10 tablet  6  . warfarin (COUMADIN) 5 MG tablet As directed  30 tablet  2   No facility-administered medications prior to visit.      Review of Systems  Constitutional: Negative for fever and unexpected weight change.  HENT: Positive for congestion. Negative for dental problem, ear pain, nosebleeds, postnasal drip, rhinorrhea, sinus pressure, sneezing, sore throat and trouble swallowing.   Eyes: Negative for redness and itching.  Respiratory: Positive for cough and shortness of breath. Negative for chest tightness and wheezing.   Cardiovascular: Negative for palpitations and leg swelling.  Gastrointestinal: Negative for nausea and vomiting.  Genitourinary: Negative for dysuria.  Musculoskeletal: Negative for joint swelling.  Skin: Negative for rash.  Neurological: Negative for headaches.  Hematological: Does not bruise/bleed easily.  Psychiatric/Behavioral: Negative for dysphoric mood. The patient is not nervous/anxious.        Objective:   Physical Exam Filed Vitals:   07/09/13 1543  Pulse: 83  Temp: 98.7 F (37.1 C)  TempSrc: Oral  Resp: 16  Height: 5\' 3"  (1.6 m)  Weight: 155 lb (70.308 kg)  SpO2: 96%   RA  Gen: well appearing, no acute distress HEENT: NCAT, PERRL, EOMi, OP clear, neck supple without masses PULM: Crackles in bases bilaterally, diminished left base CV:  Mechanical heart sounds from LVAD, notable JVD AB: BS+, soft, nontender, no hsm Ext: warm, trace edema, no clubbing, no cyanosis Derm: no rash or skin breakdown Neuro: A&Ox4, CN II-XII intact, strength 5/5 in all 4 extremities  10/2012 Simple spirometry as part of CPET> No airflow obstruction, FVC 2.09L (89% pred), FEV1 1.77 (  92% pred) 12/11/2013 PFT> normal ratio, FEV1 1.77 L, FVC 2.10 L (81% predicted), total lung capacity 3.78 L (82% predicted), DLCO 16.73 (82% predicted) November 2014 CT chest> the patient is status post LVAD, there is pleural fluid bilaterally with a small right pleural effusion and a small to moderate effusion on the left which is loculated, there is no evidence of parenchymal lung disease 05/29/2013 full pulmonary function tests> normal ratio FEV1 0.96 L (56% predicted, 10% change with bronchodilator which was less than 200 cc absolute), total lung capacity 2.60 L (see 56% predicted), DLCO 8.13 (40%  predicted)     Assessment & Plan:   Chronic respiratory failure Mrs. Speers has chronic respiratory failure with clear restrictive lung disease that has developed since her LVAD placement in August of 2015. Prior to surgery, she had a normal FEV1 and FVC. There was a suggestion of perhaps some small airways disease on the 12/11/2012 pulmonary function test. Small airways disease can be seen rarely with connective tissue diseases, active smoking, or idiopathic. However, I do not think that the evidence is strong enough to suggest that she clearly has small airways disease as this was only seen on one of four pulmonary function tests available for my review. It should also be noted that acute pulmonary edema can be associated with airflow obstruction and as she was hospitalized for decompensated heart failure at the time of the test, it is possible that this could have explained these findings.    As far as the pulmonary restriction goes, differential diagnosis includes chest wall  problems, pleural problems, neuromuscular problems, and pulmonary parenchymal problems. When you look at the CT scan from November 2014 there is no clear evidence of pulmonary parenchymal problems. However there is loculated fluid in the left lung which is likely related to postsurgical changes. I have explained to her and her daughter today in clinic that I think this is the most likely cause of the restriction. To give her the benefit of the doubt we will repeat a CT scan to see if there has been some evidence of a new pulmonary parenchymal problem which is developed but I doubt we're going to see that.  So at this point I do not see clear evidence of a pulmonary parenchymal or airway problem.   Plan: -CT chest this week without contrast to evaluate for pulmonary parenchymal disease and evaluate the status of the left-sided loculated pleural effusion -Given the suggestion of small airways disease on one of for pulmonary function testing I will give her a when necessary prescription for Xopenex to try prior to exercise. However, it should be noted that I do not feel strongly that she has an airways disease problem.     Updated Medication List Outpatient Encounter Prescriptions as of 07/09/2013  Medication Sig  . acetaminophen (TYLENOL) 325 MG tablet Take 2 tablets (650 mg total) by mouth every 6 (six) hours as needed.  [name] amiodarone (PACERONE) 100 MG tablet Take 1 tablet (100 mg total) by mouth daily.  [name] amLODipine (NORVASC) 5 MG tablet Take 1 tablet (5 mg total) by mouth daily.  [name] aspirin 325 MG EC tablet Take 1 tablet (325 mg total) by mouth daily.  . citalopram (CELEXA) 40 MG tablet Take 1 tablet (40 mg total) by mouth daily.  . cyanocobalamin (TH VITAMIN B12) 100 MCG tablet Take 1 tablet (100 mcg total) by mouth daily.  . DOBUTamine (DOBUTREX) 4-5 MG/ML-% infusion Inject 420.6 mcg/min into the vein continuous.  [name]  ferrous sulfate 324 (65 FE) MG TBEC Take 1 tablet (325 mg total) by mouth 3  (three) times daily.  . hydrochlorothiazide (HYDRODIURIL) 25 MG tablet Take 1 tablet (25 mg total) by mouth daily.  . pantoprazole (PROTONIX) 40 MG tablet Take 1 tablet (40 mg total) by mouth daily.  . potassium chloride (K-DUR) 10 MEQ tablet Take 40 meq daily  . sildenafil (REVATIO) 20 MG tablet Take 1 tablet (20 mg total) by mouth 3 (three) times daily.  . simvastatin (ZOCOR) 10 MG tablet Take 1 tablet (10 mg total) by mouth at bedtime.  [name] spironolactone (ALDACTONE) 25 MG tablet Take 1 tablet (25 mg total) by mouth daily.  [name] torsemide (DEMADEX) 20 MG tablet Take 1 tab every Mon and Fri  . warfarin (COUMADIN) 5 MG tablet As directed  . levalbuterol (XOPENEX HFA) 45 MCG/ACT inhaler Inhale 2 puffs into the lungs every 4 (four) hours as needed for wheezing.

## 2013-07-09 NOTE — Assessment & Plan Note (Addendum)
Kaitlin [name] has chronic respiratory failure with clear restrictive lung disease that has developed since her LVAD placement in August of 2015. Prior to surgery, she had a normal FEV1 and FVC. There was a suggestion of perhaps some small airways disease on the 12/11/2012 pulmonary function test. Small airways disease can be seen rarely with connective tissue diseases, active smoking, or idiopathic. However, I do not think that the evidence is strong enough to suggest that she clearly has small airways disease as this was only seen on one of four pulmonary function tests available for my review. It should also be noted that acute pulmonary edema can be associated with airflow obstruction and as she was hospitalized for decompensated heart failure at the time of the test, it is possible that this could have explained these findings.    As far as the pulmonary restriction goes, differential diagnosis includes chest wall problems, pleural problems, neuromuscular problems, and pulmonary parenchymal problems. When you look at the CT scan from November 2014 there is no clear evidence of pulmonary parenchymal problems. However there is loculated fluid in the left lung which is likely related to postsurgical changes. I have explained to her and her daughter today in clinic that I think this is the most likely cause of the restriction. To give her the benefit of the doubt we will repeat a CT scan to see if there has been some evidence of a new pulmonary parenchymal problem which is developed but I doubt we're going to see that.  So at this point I do not see clear evidence of a pulmonary parenchymal or airway problem.   Plan: -CT chest this week without contrast to evaluate for pulmonary parenchymal disease and evaluate the status of the left-sided loculated pleural effusion -Given the suggestion of small airways disease on one of for pulmonary function testing I will give her a when necessary prescription for Xopenex  to try prior to exercise. However, it should be noted that I do not feel strongly that she has an airways disease problem.

## 2013-07-09 NOTE — Patient Instructions (Signed)
Take the xopenex 2 puffs every four hours as needed for shortness of breath and ten minutes before exercise We will schedule a CT scan of your lungs and will call your daughter with the results Follow up with Dr. Teressa Lower as needed

## 2013-07-11 ENCOUNTER — Ambulatory Visit (INDEPENDENT_AMBULATORY_CARE_PROVIDER_SITE_OTHER)
Admission: RE | Admit: 2013-07-11 | Discharge: 2013-07-11 | Disposition: A | Discharge status: Home / Self Care | Payer: Medicare Other | Source: Ambulatory Visit | Attending: Pulmonary Disease | Admitting: Pulmonary Disease

## 2013-07-11 DIAGNOSIS — J984 Other disorders of lung: Secondary | ICD-10-CM

## 2013-07-13 ENCOUNTER — Telehealth: Payer: Self-pay | Admitting: Pulmonary Disease

## 2013-07-13 NOTE — Progress Notes (Signed)
Quick Note:  Spoke with pt regarding results. She verbalized understanding and requests we also inform her daughter, Sherry Ruffing. I had to lmomtcb for Kaitlin [name]. ______

## 2013-07-13 NOTE — Telephone Encounter (Signed)
Result Notes    Notes Recorded by Lupita Leash, MD on 07/13/2013 at 10:13 AM A,  Please let her know that her CT chest showed no major changes from the study in November. The fluid from the surgery and heart failure in her chest is still there, but hasn't really changed much.   Thanks B   -------  Spoke with pt.  Informed her of results per BQ.  She verbalized understanding and requested I call her daughter, Sherry Ruffing, to let her know results as well.  I called [place] and had to lmomtcb -- will route msg to [place] to ensure pt's daughter is informed of results,  Dr. Kendrick Fries, you asked pt to come back in 4 wks (around 08/06/13).  You do not have anything around this time.  Your first available is at the end of April -- this will be approx 7 wks.  Pls advise if this is ok.  Thank you.

## 2013-07-16 ENCOUNTER — Telehealth (HOSPITAL_COMMUNITY): Payer: Self-pay | Admitting: *Deleted

## 2013-07-16 DIAGNOSIS — I5022 Chronic systolic (congestive) heart failure: Secondary | ICD-10-CM

## 2013-07-16 NOTE — Telephone Encounter (Signed)
Received call from [facility], pt's PICC line has been pulled out about 9 cm, discussed w/Dr Bensimhon, he would like line to be replaced, pt is aware will arrange for tomorrow and call her back

## 2013-07-16 NOTE — Telephone Encounter (Signed)
Fine by me 

## 2013-07-16 NOTE — Telephone Encounter (Signed)
ROV has been made for 08/28/13 at 2pm.

## 2013-07-17 ENCOUNTER — Encounter: Payer: Self-pay | Admitting: Internal Medicine

## 2013-07-17 NOTE — Telephone Encounter (Signed)
Pt sch for 3/18 at 11 am

## 2013-07-18 ENCOUNTER — Other Ambulatory Visit (HOSPITAL_COMMUNITY): Payer: Self-pay | Admitting: Internal Medicine

## 2013-07-18 ENCOUNTER — Ambulatory Visit (HOSPITAL_COMMUNITY)
Admission: RE | Admit: 2013-07-18 | Discharge: 2013-07-18 | Disposition: A | Discharge status: Home Health | Payer: Medicare Other | Source: Ambulatory Visit | Attending: Internal Medicine | Admitting: Internal Medicine

## 2013-07-18 ENCOUNTER — Telehealth (HOSPITAL_COMMUNITY): Payer: Self-pay | Admitting: *Deleted

## 2013-07-18 DIAGNOSIS — Z452 Encounter for adjustment and management of vascular access device: Secondary | ICD-10-CM | POA: Diagnosis present

## 2013-07-18 DIAGNOSIS — I509 Heart failure, unspecified: Secondary | ICD-10-CM | POA: Diagnosis not present

## 2013-07-18 DIAGNOSIS — I5022 Chronic systolic (congestive) heart failure: Secondary | ICD-10-CM

## 2013-07-18 DIAGNOSIS — I871 Compression of vein: Secondary | ICD-10-CM | POA: Insufficient documentation

## 2013-07-18 MED ORDER — IOHEXOL 300 MG/ML  SOLN
50.0000 mL | Freq: Once | INTRAMUSCULAR | Status: AC | PRN
  Administered 2013-07-18: 4 mL via INTRAVENOUS

## 2013-07-18 NOTE — Progress Notes (Signed)
Dr bensimhon's office called re ms Finnigan picc line exchange. We do not carry the iv tubing for op iv pump.   Office advised to re-use tubing and med  Home health to meet pt at home to replace tubing and med  jkc

## 2013-07-18 NOTE — Procedures (Signed)
Attempted but unsuccessful exchange of basilic vein PICC. Successful placement of new dual lumen PICC line to right brachial vein. Length 39cm Tip at lower SVC/RA No complications Ready for use. See PACS for full procedure note.  Brayton El PA-C Interventional Radiology 07/18/2013 1:05 PM

## 2013-07-18 NOTE — Telephone Encounter (Signed)
Called pt's [facility] nurse; she was unable to check INR today - pt came to interventional radiology for new PICC line placement. Nurse will check INR tomorrow.

## 2013-07-19 ENCOUNTER — Ambulatory Visit (HOSPITAL_COMMUNITY): Payer: Self-pay | Admitting: *Deleted

## 2013-07-19 ENCOUNTER — Other Ambulatory Visit (HOSPITAL_COMMUNITY): Payer: Self-pay | Admitting: Adult Health

## 2013-07-19 LAB — PROTIME-INR: INR: 2.7 — AB (ref <=1.1)

## 2013-07-20 ENCOUNTER — Encounter: Payer: Self-pay | Admitting: Internal Medicine

## 2013-07-20 ENCOUNTER — Encounter: Payer: Self-pay | Admitting: Pulmonary Disease

## 2013-07-24 ENCOUNTER — Ambulatory Visit (HOSPITAL_COMMUNITY): Payer: Self-pay | Admitting: *Deleted

## 2013-07-24 ENCOUNTER — Encounter: Payer: Self-pay | Admitting: *Deleted

## 2013-07-24 LAB — PROTIME-INR: INR: 3.1 — AB (ref <=1.1)

## 2013-07-26 ENCOUNTER — Ambulatory Visit (HOSPITAL_COMMUNITY)
Admission: RE | Admit: 2013-07-26 | Discharge: 2013-07-26 | Disposition: A | Discharge status: Home / Self Care | Payer: Medicare Other | Source: Ambulatory Visit | Attending: Internal Medicine | Admitting: Internal Medicine

## 2013-07-26 ENCOUNTER — Other Ambulatory Visit (HOSPITAL_COMMUNITY): Payer: Self-pay | Admitting: *Deleted

## 2013-07-26 VITALS — BP 108/1 | HR 95 | Ht 63.0 in | Wt 145.5 lb

## 2013-07-26 DIAGNOSIS — Z79899 Other long term (current) drug therapy: Secondary | ICD-10-CM

## 2013-07-26 DIAGNOSIS — E079 Disorder of thyroid, unspecified: Secondary | ICD-10-CM

## 2013-07-26 DIAGNOSIS — R0989 Other specified symptoms and signs involving the circulatory and respiratory systems: Secondary | ICD-10-CM

## 2013-07-26 DIAGNOSIS — F411 Generalized anxiety disorder: Secondary | ICD-10-CM

## 2013-07-26 DIAGNOSIS — Z95811 Presence of heart assist device: Secondary | ICD-10-CM | POA: Insufficient documentation

## 2013-07-26 DIAGNOSIS — I5022 Chronic systolic (congestive) heart failure: Secondary | ICD-10-CM

## 2013-07-26 DIAGNOSIS — R0609 Other forms of dyspnea: Secondary | ICD-10-CM

## 2013-07-26 DIAGNOSIS — I5023 Acute on chronic systolic (congestive) heart failure: Secondary | ICD-10-CM | POA: Insufficient documentation

## 2013-07-26 DIAGNOSIS — R0600 Dyspnea, unspecified: Secondary | ICD-10-CM

## 2013-07-26 DIAGNOSIS — I10 Essential (primary) hypertension: Secondary | ICD-10-CM | POA: Insufficient documentation

## 2013-07-26 DIAGNOSIS — I50810 Right heart failure, unspecified: Secondary | ICD-10-CM

## 2013-07-26 DIAGNOSIS — I509 Heart failure, unspecified: Secondary | ICD-10-CM

## 2013-07-26 DIAGNOSIS — J9610 Chronic respiratory failure, unspecified whether with hypoxia or hypercapnia: Secondary | ICD-10-CM

## 2013-07-26 DIAGNOSIS — Z7901 Long term (current) use of anticoagulants: Secondary | ICD-10-CM

## 2013-07-26 LAB — COMPREHENSIVE METABOLIC PANEL
ALBUMIN: 4.1 g/dL (ref 3.5–5.2)
ALT: 20 U/L (ref 0–35)
AST: 23 U/L (ref 0–37)
Alkaline Phosphatase: 123 U/L — ABNORMAL HIGH (ref 39–117)
BUN: 33 mg/dL — AB (ref 6–23)
CO2: 18 mEq/L — ABNORMAL LOW (ref 19–32)
Calcium: 10.1 mg/dL (ref 8.4–10.5)
Chloride: 104 mEq/L (ref 96–112)
Creatinine, Ser: 1.44 mg/dL — ABNORMAL HIGH (ref 0.50–1.10)
GFR calc Af Amer: 43 mL/min — ABNORMAL LOW (ref >90)
GFR calc non Af Amer: 37 mL/min — ABNORMAL LOW (ref >90)
Glucose, Bld: 124 mg/dL — ABNORMAL HIGH (ref 70–99)
POTASSIUM: 4 meq/L (ref 3.7–5.3)
Sodium: 140 mEq/L (ref 137–147)
TOTAL PROTEIN: 7.4 g/dL (ref 6.0–8.3)
Total Bilirubin: 1.5 mg/dL — ABNORMAL HIGH (ref 0.3–1.2)

## 2013-07-26 LAB — PROTIME-INR
INR: 2.2 — AB (ref 0.00–1.49)
PROTHROMBIN TIME: 23.7 s — AB (ref 11.6–15.2)

## 2013-07-26 LAB — CBC
HCT: 31.2 % — ABNORMAL LOW (ref 36.0–46.0)
Hemoglobin: 10.3 g/dL — ABNORMAL LOW (ref 12.0–15.0)
MCH: 29.2 pg (ref 26.0–34.0)
MCHC: 33 g/dL (ref 30.0–36.0)
MCV: 88.4 fL (ref 78.0–100.0)
PLATELETS: 263 10*3/uL (ref 150–400)
RBC: 3.53 MIL/uL — ABNORMAL LOW (ref 3.87–5.11)
RDW: 17.5 % — AB (ref 11.5–15.5)
WBC: 9 10*3/uL (ref 4.0–10.5)

## 2013-07-26 LAB — LACTATE DEHYDROGENASE: LDH: 252 U/L — AB (ref 94–250)

## 2013-07-26 LAB — PRO B NATRIURETIC PEPTIDE: Pro B Natriuretic peptide (BNP): 2022 pg/mL — ABNORMAL HIGH (ref 0–125)

## 2013-07-26 MED ORDER — FUROSEMIDE 10 MG/ML IJ SOLN
40.0000 mg | Freq: Once | INTRAMUSCULAR | Status: AC
Start: 2013-07-26 — End: 2013-07-26
  Administered 2013-07-26: 40 mg via INTRAVENOUS
  Filled 2013-07-26: qty 4

## 2013-07-26 MED ORDER — POTASSIUM CHLORIDE CRYS ER 20 MEQ PO TBCR
40.0000 meq | EXTENDED_RELEASE_TABLET | Freq: Once | ORAL | Status: AC
  Administered 2013-07-26: 20 meq via ORAL
  Filled 2013-07-26: qty 2

## 2013-07-26 NOTE — Patient Instructions (Addendum)
1.  Increase Torsemide to 20 mg every Monday, Wednesday, and Friday 2.  Continue Coumadin 2.5 mg daily except take 5 mg on Sunay/Thursday/Saturday

## 2013-07-26 NOTE — Progress Notes (Addendum)
Urinary output post-40mg  IV lasix: 700cc

## 2013-07-26 NOTE — Progress Notes (Addendum)
Patient ID: Kaitlin [name], female   DOB: 10-07-1945, 68 y.o.   MRN: [id number]   Subjective: 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   8/18 Underwent HM II LVAD implant (under destination criteria) along with TV repair. Post-op couse c/b RHF requiring take back to the OR for bleeding and temproray RVAD support. Subsequently started on dobutamine for RV support.  Recent PFTs with severe airflow limitation FEV1 < 1.0L  06/13/13 completed 200 feet in 2 minutes, but had to stop due to SOB  Acute visit: Called d/t increased SOB. Patient drove herself and had to be wheeled back to clinic in a wheelchair. Reports starting Monday she noticed increased SOB. Last week was able to go from one side of the house to the other with no issues, however now has to make stops. Denies orthopnea, CP, edema or PND. Denies palpitations. Repeat CT from pulmonology showed small loculated left pleural effusion, unchanged from last CT and small, simple appearing right pleural effusion, slightly enlarged. Was started on xopenex, however stopped d/t "bumps in mouth." Remains on dobutamine 5 mcg. Having to stop and rest to perform ADLs. Weight at home 139-141 (last week 137 lbs)  Labs 06/27/13 LDH: 280 Creatinine 1.55 K 4.2 Hemoglobin 10.4 INR 2.55          07/24/13: Hgb 10.1, BUN 29, Creatinine 1.25, Mag 2.0, K+ 3.8  Denies LVAD alarms.  Denies driveline trauma, erythema or drainage.  Denies ICD shocks. Reports taking Coumadin as prescribed.  Denies bright red blood per rectum or melena, no dark urine or hematuria.    ROS: All systems negative except as listed in HPI, PMH and Problem List.  Past Medical History  Diagnosis Date  . Paroxysmal supraventricular tachycardia   . Hypokalemia   . Chronic systolic heart failure     a. 08/979 Echo: EF 15%, mod to sev antlat and inflat HK, inf AK, mild to mod MR, mildly/mod  reduced RV fxn, Mod TR, PASP .  [name] History of DVT (deep vein thrombosis)   . Dyslipidemia   . HTN (hypertension)   . Nonischemic cardiomyopathy     a. 06/2012 Echo: EF 15%;  b. 06/2012 Cath: nl except luminal irregs in LAD.  [name] Ventricular tachycardia     a. s/p ICD  . Goiter 2001  . Implantable cardiac defibrillator- Biotronik 2009    Single chamber  . [name] (left bundle branch block)     intermittent  . Palliative care patient     Current Outpatient Prescriptions  Medication Sig Dispense Refill  . acetaminophen (TYLENOL) 325 MG tablet Take 2 tablets (650 mg total) by mouth every 6 (six) hours as needed.  20 tablet  2  . amiodarone (PACERONE) 100 MG tablet Take 1 tablet (100 mg total) by mouth daily.  30 tablet  2  . amLODipine (NORVASC) 5 MG tablet TAKE 1 TABLET BY MOUTH DAILY.  30 tablet  PRN  . aspirin 325 MG EC tablet Take 1 tablet (325 mg total) by mouth daily.  30 tablet  2  . citalopram (CELEXA) 40 MG tablet Take 1 tablet (40 mg total) by mouth daily.  90 tablet  3  . cyanocobalamin (TH VITAMIN B12) 100 MCG tablet Take 1 tablet (100 mcg total) by mouth daily.  30 tablet  2  . DOBUTamine (DOBUTREX) 4-5 MG/ML-% infusion Inject 420.6 mcg/min into the vein  continuous.  250 mL  6  . ferrous sulfate 324 (65 FE) MG TBEC Take 1 tablet (325 mg total) by mouth 3 (three) times daily.  90 tablet  6  . hydrochlorothiazide (HYDRODIURIL) 25 MG tablet Take 1 tablet (25 mg total) by mouth daily.  30 tablet  3  . pantoprazole (PROTONIX) 40 MG tablet Take 1 tablet (40 mg total) by mouth daily.  90 tablet  3  . potassium chloride (K-DUR) 10 MEQ tablet Take 40 meq daily  120 tablet  2  . sildenafil (REVATIO) 20 MG tablet Take 1 tablet (20 mg total) by mouth 3 (three) times daily.  90 tablet  2  . simvastatin (ZOCOR) 10 MG tablet Take 1 tablet (10 mg total) by mouth at bedtime.  30 tablet  2  . spironolactone (ALDACTONE) 25 MG tablet Take 1 tablet (25 mg total) by mouth daily.  30 tablet  2  .  torsemide (DEMADEX) 20 MG tablet Take 1 tab every Mon and Fri  10 tablet  6  . warfarin (COUMADIN) 5 MG tablet As directed  30 tablet  2  . levalbuterol (XOPENEX HFA) 45 MCG/ACT inhaler Inhale 2 puffs into the lungs every 4 (four) hours as needed for wheezing.  1 Inhaler  12   No current facility-administered medications for this encounter.    Filed Vitals:   07/26/13 1222  BP: 108/1  Pulse: 95  Weight: 145 lb 8 oz (65.998 kg)  SpO2: 95%    LVAD interrogation reveals:  Speed: 9400  Flow: 4.1 Power: 5.1 PI: 5.0  Alarms: none  Events: 12-13 every few days Fixed speed: 9400  Low speed limit: 8800    I reviewed the LVAD parameters from today, and compared the results to the patient's prior recorded data.  No programming changes were made.  The LVAD is functioning within specified parameters.  The patient performs LVAD self-test daily.  LVAD interrogation was negative for any significant power changes, alarms or PI events/speed drops.  LVAD equipment check completed and is in good working order.  Back-up equipment present.   LVAD education done on emergency procedures and precautions and reviewed exit site care.   PHYSICAL EXAM: General:  Well appearing. SOB after walking to desk  HEENT: normal Neck: supple. JVP ~9. Carotids 2+ bilaterally; no bruits. No lymphadenopathy or thryomegaly appreciated. Cor: LVAD hum Lungs: clear with decreased BS throughout Abdomen: soft, nontender, + distended. No hepatosplenomegaly. No bruits or masses. Good bowel sounds. Driveline site looks good. Anchor in place Extremities: no cyanosis, clubbing, rash, tr edema. PICC line site ok  Neuro: alert & orientedx3, cranial nerves grossly intact. Moves all 4 extremities w/o difficulty. Affect pleasant.  EKG: SR 93 BPM 1 degree AV block  ASSESSMENT & PLAN:  1. Chronic biventricular HF: s/p HM II LVAD. - NYHA IIIb/IV symptoms and volume status appears mildly elevated. Her weight at home is usually 137 she  reports and today is 141 lbs. She does not appear to have a lot of fluid on board, but is very SOB. Will give 40 mg IV lasix now with 20 meq of potassium. Increase home torsemide to 20 mg q M, W and Friday. - Check BMET. - Continue dobutamine. Prior to this visit she was doing very well and reports getting out and walking. Was considering trying to wean very slowly to see if she could tolerate, but concerned she may always need support with her RV being so decompensated. - Reinforced the need and importance  of daily weights, a low sodium diet, and fluid restriction (less than 2 L a day). Instructed to call the HF clinic if weight increases more than 3 lbs overnight or 5 lbs in a week.  2. RV failure -  - as above continue dobutamine thru 2L PICC 3. CKD stage III - baseline Cr 1.4-1.6. Will check BMET today.  4. H/O VT s/p Biotronik ICD 5. COPD  - Was just seen by pulmonology. She has restrictive lung disease after LVAD placement. Had repeat CT of chest which showed small loculated L pleural effusion unchanged from previous CT and small R pleural effusion slightly enlarged from last CT. With continued SOB will see if increasing diuretics will help. Do not believe the effusions are large enough to tap.  6. LVAD - stable. All parameters within normal limits. Check LDH, BMET, pro-BNP and INR today 7. HTN - MAP continues to be elevated even with norvasc 5 mg daily. May need to increase to 10 mg next visit.  8. Anxiety - Worried that some of her SOB may be related to anxiety. Will give her prescription for xanax 0.25 mg PRN   F/U 1 week Ulla [place], Seanpaul Preece B NP-C 9:48 AM

## 2013-07-26 NOTE — Progress Notes (Signed)
Symptom  Yes  No  Details   Angina        x Activity:   Claudication        x How far:   Syncope        x When:   Stroke        x   Orthopnea        x How many pillows:   1  PND        x How often:  CPAP      N/A How many hrs:   Pedal edema               x  clears overnight  Abd fullness         x        started Monday - progressed this week  N&V        x   Diaphoresis        x When:  Bleeding       x   Urine color          yellow  SOB        x  Activity:  Since Monday; room to room  Palpitations        x When:  ICD shock        x   Hospitlizaitons        x When/where/why:  ED visit        x When/where/why:  Other MD        x       When/who/why:   Dr. Kendrick Fries  Activity    Limited with SOB  Fluid      < 2000 daily  Diet     Low sodium   Vital signs: HR:  95 MAP BP:  108; 90; 94 O2 Sat: 95 Wt:  145.8 lbs Last wt: 147.8 lbs Ht: 5'3"  LVAD interrogation reveals:  Speed: 9400 Flow:  4.5 Power:  5.3 PI: 4.7 Alarms:  none Events:  Rare except 12 PI today; 9 PI on 16th, 12 PI on 10th Fixed speed: 9400 Low speed limit:  8800  LVAD exit site:  Well healed and incorporated. The velour is fully implanted at exit site. Dressing dry and intact. No erythema or drainage. Stabilization device present and accurately applied. Driveline dressing is being changed weekly per sterile technique using  Sorbaview dressing with biopatch on exit site. Pt denies fever or chills. Dressing supplies provided for patient.  Pt/caregiver deny any alarms or VAD equipment issues. Pt is performing daily controller and system monitor self tests along with completing weekly and monthly maintenance for LVAD equipment.   LVAD equipment check completed and is in good working order. Back-up equipment present. LVAD education done on emergency procedures and precautions and reviewed exit site care.

## 2013-07-27 LAB — T4, FREE: Free T4: 2.14 ng/dL — ABNORMAL HIGH (ref 0.80–1.80)

## 2013-07-27 LAB — TSH: TSH: 0.951 u[IU]/mL (ref 0.350–4.500)

## 2013-07-30 ENCOUNTER — Telehealth: Payer: Self-pay | Admitting: *Deleted

## 2013-07-30 ENCOUNTER — Ambulatory Visit (HOSPITAL_COMMUNITY): Payer: Self-pay | Admitting: *Deleted

## 2013-07-30 ENCOUNTER — Telehealth: Payer: Self-pay

## 2013-07-30 NOTE — Telephone Encounter (Signed)
Message copied by Velvet Bathe on Mon Jul 30, 2013  5:01 PM ------      Message from: Lupita Leash      Created: Fri Jul 13, 2013 10:13 AM       A,            Please let her know that her CT chest showed no major changes from the study in November. The fluid from the surgery and heart failure in her chest is still there, but hasn't really changed much.              Thanks      B ------

## 2013-07-30 NOTE — Telephone Encounter (Signed)
PA to [employer] for sildenifil

## 2013-07-30 NOTE — Telephone Encounter (Signed)
Pt aware of results, nothing further needed. 

## 2013-08-02 ENCOUNTER — Ambulatory Visit (HOSPITAL_BASED_OUTPATIENT_CLINIC_OR_DEPARTMENT_OTHER)
Admission: RE | Admit: 2013-08-02 | Discharge: 2013-08-02 | Disposition: A | Discharge status: Home / Self Care | Payer: Medicare HMO | Source: Ambulatory Visit | Attending: Cardiology | Admitting: Cardiology

## 2013-08-02 ENCOUNTER — Other Ambulatory Visit (HOSPITAL_COMMUNITY): Payer: Self-pay | Admitting: *Deleted

## 2013-08-02 ENCOUNTER — Telehealth (HOSPITAL_COMMUNITY): Payer: Self-pay | Admitting: *Deleted

## 2013-08-02 ENCOUNTER — Ambulatory Visit (HOSPITAL_COMMUNITY)
Admission: RE | Admit: 2013-08-02 | Discharge: 2013-08-02 | Disposition: A | Discharge status: Home / Self Care | Payer: Medicare HMO | Source: Ambulatory Visit | Attending: Cardiothoracic Surgery | Admitting: Cardiothoracic Surgery

## 2013-08-02 VITALS — BP 88/0 | HR 94 | Ht 63.0 in | Wt 140.0 lb

## 2013-08-02 DIAGNOSIS — R141 Gas pain: Secondary | ICD-10-CM | POA: Insufficient documentation

## 2013-08-02 DIAGNOSIS — R0602 Shortness of breath: Secondary | ICD-10-CM | POA: Insufficient documentation

## 2013-08-02 DIAGNOSIS — Z95811 Presence of heart assist device: Secondary | ICD-10-CM

## 2013-08-02 DIAGNOSIS — R140 Abdominal distension (gaseous): Secondary | ICD-10-CM

## 2013-08-02 DIAGNOSIS — R142 Eructation: Principal | ICD-10-CM | POA: Insufficient documentation

## 2013-08-02 DIAGNOSIS — R1084 Generalized abdominal pain: Secondary | ICD-10-CM | POA: Diagnosis not present

## 2013-08-02 DIAGNOSIS — J90 Pleural effusion, not elsewhere classified: Secondary | ICD-10-CM | POA: Diagnosis not present

## 2013-08-02 DIAGNOSIS — R143 Flatulence: Principal | ICD-10-CM

## 2013-08-02 MED ORDER — SILDENAFIL CITRATE 20 MG PO TABS: 20.0000 mg | ORAL_TABLET | Freq: Three times a day (TID) | ORAL | Status: AC

## 2013-08-02 NOTE — Patient Instructions (Addendum)
1.  Sorbitol (buy over the counter) 3 oz one time only 2.  Mylecon (Gas-X) (over the counter) 80 mg twice daily  3.  Colace (stool softner - over the counter) 100 mg once daily 2.  Decrease Ferrous sulfate (iron pill) to one time daily 3.  Increase Demadex (torsemide) to 20 mg every other day 4.  Increase protein intake 5.  Labs next Wednesday per [facility] nurse 6.  Return to VAD clinic in 2 weeks

## 2013-08-02 NOTE — Telephone Encounter (Signed)
[facility] sent second deniel for SILDENAFIL.

## 2013-08-02 NOTE — Telephone Encounter (Signed)
Pt called to report ongoing abdominal pressure. States distention has improved since increasing diuretic, but now feels a "lemon size ball" under right rib cage; size fluctuates. Pt c/o not being able to take full breath, feels it "catches on something" and continues to have SOB.  Pt requesting GI consult, feels "something is wrong with my stomach". Dr. Donata Clay updated. Instructed pt to come today for chest and abd x-ray prior to clinic visit with Dr. Donata Clay. Pt verbalized understanding of same.

## 2013-08-02 NOTE — Progress Notes (Signed)
Symptom  Yes  No  Details   Angina        x Activity:   Claudication        x How far:   Syncope        x When:   Stroke        x   Orthopnea        x How many pillows:   1  PND        x How often:  CPAP      N/A How many hrs:   Pedal edema               x   Abd fullness               x    N&V        x   Diaphoresis        x When:  Bleeding       x   Urine color          yellow  SOB        x Activity:    Palpitations        x When:  ICD shock        x   Hospitlizaitons        x When/where/why:  ED visit        x When/where/why:  Other MD              x When/who/why:     Activity    Household chores; no formal exercise  Fluid      < 2000 daily  Diet     Low sodium   Vital signs: HR:  94 MAP BP:  88 O2 Sat: 94 Wt:  140 lbs Last wt: 145.8 lbs Ht: 5'3"  LVAD interrogation reveals:  Speed: 9400 Flow:  4.5 Power:  5.3 PI: 4.7 Alarms:  none Events:  Rare except 12 PI today; 9 PI on 16th, 12 PI on 10th Fixed speed: 9400 Low speed limit:  8800  LVAD exit site:  Well healed and incorporated. The velour is fully implanted at exit site. Dressing dry and intact. No erythema or drainage. Stabilization device present and accurately applied. Driveline dressing is being changed weekly per sterile technique using  Sorbaview dressing with biopatch on exit site. Pt denies fever or chills.   Pt/caregiver deny any alarms or VAD equipment issues. Pt is performing daily controller and system monitor self tests along with completing weekly and monthly maintenance for LVAD equipment.

## 2013-08-02 NOTE — Progress Notes (Signed)
Patient ID: Kaitlin [name], female   DOB: 1946/04/05, 68 y.o.   MRN: [id number]  HPI: The patient presents for an LVAD clinic visit between scheduled visits because of upper abdominal swelling and discomfort. she has a fair amount of gas bloat type symptoms but no clear reflux. She is taking Protonix 40 mg daily as well as iron sulfate 3 times a day  The patient is status post placement HeartMate 2 with tricuspid annuloplasty repair August 2014. 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 On her last LVAD clinic visit she had evidence of fluid overload and her diuretic dose was increased with improvement in symptoms and a 6 pound weight loss. Chest CT scan performed at last LVAD clinic visit demonstrated a small-moderate right pleural effusion and a small loculated left pleural effusion. LVAD pump in good position. The patient has persistent dyspnea on  exertion probably related to her RV dysfunction but she is able to walk up and down the driveway daily. She has been evaluated for cardiac transplantation but has relative contraindications because of pulmonary and renal dysfunction.  Review of her chest x-ray today shows blunting of the right costophrenic angle, no evidence of pulmonary edema. Abdominal films show fairly large stool burden in the transverse colon. she states she has bowel movements daily but they are firm and dry--she is taking iron 3 tablets daily.   Follow up:   Review of her LVAD system monitor demonstrates no PI events, normal flow rates.  Denies LVAD alarms.  Denies driveline trauma, erythema or drainage.  Denies ICD shocks.   Reports taking Coumadin as prescribed and adherence to anticoagulation based dietary restrictions.  Denies bright red blood per rectum or melena, no dark urine or hematuria.     Past Medical History  Diagnosis Date  . Paroxysmal supraventricular tachycardia   . Hypokalemia   . Chronic systolic heart failure     a. 12/4663  Echo: EF 15%, mod to sev antlat and inflat HK, inf AK, mild to mod MR, mildly/mod reduced RV fxn, Mod TR, PASP .  [name] History of DVT (deep vein thrombosis)   . Dyslipidemia   . HTN (hypertension)   . Nonischemic cardiomyopathy     a. 06/2012 Echo: EF 15%;  b. 06/2012 Cath: nl except luminal irregs in LAD.  [name] Ventricular tachycardia     a. s/p ICD  . Goiter 2001  . Implantable cardiac defibrillator- Biotronik 2009    Single chamber  . [name] (left bundle branch block)     intermittent  . Palliative care patient     Current Outpatient Prescriptions  Medication Sig Dispense Refill  . acetaminophen (TYLENOL) 325 MG tablet Take 2 tablets (650 mg total) by mouth every 6 (six) hours as needed.  20 tablet  2  . Alum & Mag Hydroxide-Simeth (MI ACID-II PO) Take 80 mg by mouth 2 (two) times daily.      [name] amiodarone (PACERONE) 100 MG tablet Take 1 tablet (100 mg total) by mouth daily.  30 tablet  2  . amLODipine (NORVASC) 5 MG tablet TAKE 1 TABLET BY MOUTH DAILY.  30 tablet  PRN  . aspirin 325 MG EC tablet Take 1 tablet (325 mg total) by mouth daily.  30 tablet  2  . citalopram (CELEXA) 40 MG tablet Take 1 tablet (40 mg total) by mouth daily.  90 tablet  3  . cyanocobalamin (TH VITAMIN B12) 100 MCG tablet Take 1 tablet (100 mcg total) by mouth  daily.  30 tablet  2  . DOBUTamine (DOBUTREX) 4-5 MG/ML-% infusion Inject 420.6 mcg/min into the vein continuous.  250 mL  6  . docusate sodium (COLACE) 100 MG capsule Take 100 mg by mouth daily.      . ferrous sulfate 324 (65 FE) MG TBEC Take 1 tablet by mouth daily.      . hydrochlorothiazide (HYDRODIURIL) 25 MG tablet Take 1 tablet (25 mg total) by mouth daily.  30 tablet  3  . pantoprazole (PROTONIX) 40 MG tablet Take 1 tablet (40 mg total) by mouth daily.  90 tablet  3  . potassium chloride (K-DUR) 10 MEQ tablet Take 40 meq daily  120 tablet  2  . sildenafil (REVATIO) 20 MG tablet Take 1 tablet (20 mg total) by mouth 3 (three) times daily.  90 tablet  6   . simvastatin (ZOCOR) 10 MG tablet Take 1 tablet (10 mg total) by mouth at bedtime.  30 tablet  2  . spironolactone (ALDACTONE) 25 MG tablet Take 1 tablet (25 mg total) by mouth daily.  30 tablet  2  . torsemide (DEMADEX) 20 MG tablet every other day.       . warfarin (COUMADIN) 5 MG tablet As directed  30 tablet  2  . levalbuterol (XOPENEX HFA) 45 MCG/ACT inhaler Inhale 2 puffs into the lungs every 4 (four) hours as needed for wheezing.  1 Inhaler  12   No current facility-administered medications for this encounter.    Review of patient's allergies indicates no known allergies.  REVIEW OF SYSTEMS: All systems negative except as listed in HPI, PMH and Problem list.   LVAD INTERROGATION:   HeartMate II LVAD:  Flow 4.5liters/min, speed 9400 RPM, power 5.3 PI 4.7 , no alarms    I reviewed the LVAD parameters from today, and compared the results to the patient's prior recorded data.  No programming changes were made.  The LVAD is functioning within specified parameters.  The patient performs LVAD self-test daily.  LVAD interrogation was negative for any significant power changes, alarms or PI events/speed drops.  LVAD equipment check completed and is in good working order.  Back-up equipment present.   LVAD education done on emergency procedures and precautions and reviewed exit site care.    Filed Vitals:   08/02/13 1251  BP: 88/0  Pulse: 94  Height: 5\' 3"  (1.6 m)  Weight: 140 lb (63.504 kg)  SpO2: 96%    Physical Exam: GENERAL: Well appearing, female who presents to clinic today in no acute distress. HEENT: normal  NECK: Supple, JVP  increased , no bruits.  No lymphadenopathy or thyromegaly appreciated.   CARDIAC:  Mechanical heart sounds with LVAD hum present.  LUNGS:  Clear to auscultation bilaterally. Sternal incision well-healed. ABDOMEN:  Soft, round, nontender, positive bowel sounds x4. Mild tympany in upper abdomen , no focal tenderness   LVAD exit site: well-healed and  incorporated.  Dressing dry and intact.  No erythema or drainage.  Stabilization device present and accurately applied.  Driveline dressing is being changed daily per sterile technique. EXTREMITIES:  Warm and dry, no cyanosis, clubbing, rash, mild pedal edema  without tenderness  NEUROLOGIC:  Alert and oriented x 4.  Gait steady.  No aphasia.  No dysarthria.  Affect pleasant.     EKG: paced rhythm   ASSESSMENT AND PLAN:   The patient's symptoms appear to be mainly GI. She has no palpable mass or focal tenderness. Her KUB shows significant stool burden  in the transverse colon with some dilatation. We will reduce her iron from 3 times a day to daily, add a stool softener, give her a dose of sorbitol to help clear the colon, and give twice a day Mylicon to reduce upper GI gas bloat symptoms. I do not think that Reglan would be helpful.  The patient also has a slight increase in right pleural effusion from previous recent CT scan and increased JV P. Her oral diuretic will be changed from Monday Wednesday Friday to every other day. She may need right thoracentesis in the future and would recommend chest x-ray when she returns to LVAD clinic.  Status post LVAD implantation 8 months ago, now with stable class 2-3 symptoms on home inotropes for RV dysfunction. [name]

## 2013-08-06 ENCOUNTER — Encounter: Payer: Self-pay | Admitting: Internal Medicine

## 2013-08-08 ENCOUNTER — Ambulatory Visit (HOSPITAL_COMMUNITY): Payer: Self-pay | Admitting: *Deleted

## 2013-08-08 ENCOUNTER — Encounter: Payer: Self-pay | Admitting: Anesthesiology

## 2013-08-08 ENCOUNTER — Encounter (HOSPITAL_COMMUNITY): Payer: Medicare Other

## 2013-08-08 LAB — PROTIME-INR: INR: 2 — AB (ref <=1.1)

## 2013-08-13 ENCOUNTER — Telehealth: Payer: Self-pay | Admitting: *Deleted

## 2013-08-13 NOTE — Telephone Encounter (Signed)
Per [facility] automation the amiodarone is covered under patients plan

## 2013-08-15 ENCOUNTER — Other Ambulatory Visit (HOSPITAL_COMMUNITY): Payer: Self-pay | Admitting: *Deleted

## 2013-08-15 ENCOUNTER — Ambulatory Visit (HOSPITAL_COMMUNITY)
Admission: RE | Admit: 2013-08-15 | Discharge: 2013-08-15 | Disposition: A | Discharge status: Home / Self Care | Payer: Medicare HMO | Source: Ambulatory Visit | Attending: Internal Medicine | Admitting: Internal Medicine

## 2013-08-15 ENCOUNTER — Ambulatory Visit (HOSPITAL_COMMUNITY): Payer: Self-pay | Admitting: *Deleted

## 2013-08-15 VITALS — BP 74/0 | HR 86 | Ht 63.0 in | Wt 140.2 lb

## 2013-08-15 DIAGNOSIS — Z95811 Presence of heart assist device: Secondary | ICD-10-CM

## 2013-08-15 DIAGNOSIS — F3289 Other specified depressive episodes: Secondary | ICD-10-CM | POA: Diagnosis not present

## 2013-08-15 DIAGNOSIS — F32A Depression, unspecified: Secondary | ICD-10-CM

## 2013-08-15 DIAGNOSIS — J449 Chronic obstructive pulmonary disease, unspecified: Secondary | ICD-10-CM | POA: Insufficient documentation

## 2013-08-15 DIAGNOSIS — K5900 Constipation, unspecified: Secondary | ICD-10-CM

## 2013-08-15 DIAGNOSIS — N183 Chronic kidney disease, stage 3 unspecified: Secondary | ICD-10-CM | POA: Diagnosis not present

## 2013-08-15 DIAGNOSIS — I50810 Right heart failure, unspecified: Secondary | ICD-10-CM

## 2013-08-15 DIAGNOSIS — Z48812 Encounter for surgical aftercare following surgery on the circulatory system: Secondary | ICD-10-CM | POA: Insufficient documentation

## 2013-08-15 DIAGNOSIS — F329 Major depressive disorder, single episode, unspecified: Secondary | ICD-10-CM

## 2013-08-15 DIAGNOSIS — I471 Supraventricular tachycardia: Secondary | ICD-10-CM

## 2013-08-15 DIAGNOSIS — I5022 Chronic systolic (congestive) heart failure: Secondary | ICD-10-CM

## 2013-08-15 DIAGNOSIS — Z7901 Long term (current) use of anticoagulants: Secondary | ICD-10-CM

## 2013-08-15 DIAGNOSIS — I509 Heart failure, unspecified: Secondary | ICD-10-CM

## 2013-08-15 DIAGNOSIS — I10 Essential (primary) hypertension: Secondary | ICD-10-CM

## 2013-08-15 DIAGNOSIS — I498 Other specified cardiac arrhythmias: Secondary | ICD-10-CM

## 2013-08-15 DIAGNOSIS — J9610 Chronic respiratory failure, unspecified whether with hypoxia or hypercapnia: Secondary | ICD-10-CM

## 2013-08-15 DIAGNOSIS — I129 Hypertensive chronic kidney disease with stage 1 through stage 4 chronic kidney disease, or unspecified chronic kidney disease: Secondary | ICD-10-CM | POA: Diagnosis not present

## 2013-08-15 DIAGNOSIS — J4489 Other specified chronic obstructive pulmonary disease: Secondary | ICD-10-CM | POA: Insufficient documentation

## 2013-08-15 LAB — CBC
HEMATOCRIT: 31.9 % — AB (ref 36.0–46.0)
Hemoglobin: 10.5 g/dL — ABNORMAL LOW (ref 12.0–15.0)
MCH: 28.8 pg (ref 26.0–34.0)
MCHC: 32.9 g/dL (ref 30.0–36.0)
MCV: 87.6 fL (ref 78.0–100.0)
Platelets: 284 10*3/uL (ref 150–400)
RBC: 3.64 MIL/uL — AB (ref 3.87–5.11)
RDW: 16.7 % — AB (ref 11.5–15.5)
WBC: 9.3 10*3/uL (ref 4.0–10.5)

## 2013-08-15 LAB — BASIC METABOLIC PANEL
BUN: 21 mg/dL (ref 6–23)
CO2: 21 meq/L (ref 19–32)
Calcium: 10.2 mg/dL (ref 8.4–10.5)
Chloride: 98 mEq/L (ref 96–112)
Creatinine, Ser: 1.47 mg/dL — ABNORMAL HIGH (ref 0.50–1.10)
GFR calc non Af Amer: 36 mL/min — ABNORMAL LOW (ref >90)
GFR, EST AFRICAN AMERICAN: 42 mL/min — AB (ref >90)
Glucose, Bld: 119 mg/dL — ABNORMAL HIGH (ref 70–99)
POTASSIUM: 3.9 meq/L (ref 3.7–5.3)
Sodium: 136 mEq/L — ABNORMAL LOW (ref 137–147)

## 2013-08-15 LAB — PROTIME-INR
INR: 1.83 — AB (ref 0.00–1.49)
Prothrombin Time: 20.6 seconds — ABNORMAL HIGH (ref 11.6–15.2)

## 2013-08-15 LAB — PRO B NATRIURETIC PEPTIDE: Pro B Natriuretic peptide (BNP): 1479 pg/mL — ABNORMAL HIGH (ref 0–125)

## 2013-08-15 LAB — LACTATE DEHYDROGENASE: LDH: 265 U/L — AB (ref 94–250)

## 2013-08-15 MED ORDER — BISACODYL 5 MG PO TBEC: 5.0000 mg | DELAYED_RELEASE_TABLET | Freq: Every day | ORAL | Status: DC | PRN

## 2013-08-15 NOTE — Progress Notes (Signed)
Symptom  Yes  No  Details   Angina        x Activity:   Claudication        x How far:   Syncope        x When:   Stroke        x   Orthopnea        x How many pillows:   1  PND        x How often:  CPAP      N/A How many hrs:   Pedal edema               x   Abd fullness               x    N&V        x   Diaphoresis        x When:  Bleeding       x   Urine color          yellow  SOB        x Activity:    Palpitations        x When:  ICD shock        x   Hospitlizaitons        x When/where/why:  ED visit        x When/where/why:  Other MD              x When/who/why:     Activity    Household chores; no formal exercise  Fluid      < 2000 daily  Diet     Low sodium   Vital signs: HR:  86 MAP BP:  74 O2 Sat:  98 Wt:  140.2  lbs Last wt: 140  lbs Ht: 5'3"  LVAD interrogation reveals:  Speed: 9400 Flow:  4.3 Power:  5.3 PI: 4.9 Alarms:  none Events:  Rare except 12 PI today; 9 PI on 16th, 12 PI on 10th Fixed speed: 9400 Low speed limit:  8800  LVAD exit site:  Well healed and incorporated. The velour is fully implanted at exit site. Dressing dry and intact. No erythema or drainage. Stabilization device present and accurately applied. Driveline dressing is being changed weekly per sterile technique using  Sorbaview dressing with biopatch on exit site. Pt denies fever or chills.   Pt/caregiver deny any alarms or VAD equipment issues. Pt is performing daily controller and system monitor self tests along with completing weekly and monthly maintenance for LVAD equipment.

## 2013-08-15 NOTE — Progress Notes (Addendum)
Patient ID: Kaitlin [name], female   DOB: [birth year], 68 y.o.   MRN: [id number]  Subjective: Kaitlin [name] is a 68 y/o woman with h/o severe HTN, [name], VT s/ Biotronik ICD,CKD (Baseline Cr 1.5-1.6) , severe biventricular HF due to NICM.   8/18 Underwent HM II LVAD implant (under destination criteria) along with TV repair (tricuspid annuloplasty). Post-op couse c/b RHF requiring take back to the OR for bleeding and temproray RVAD support. Subsequently started on dobutamine for RV support.  Recent PFTs with severe airflow limitation FEV1 < 1.0L  06/13/13 completed 200 feet in 2 minutes, but had to stop due to SOB  Follow up: Last visit CXR showed no pulmonary edema but abdominal films showed fairly large stool burden in transverse colon. Dropped iron to daily, started colace, mylicon BID and increased torsemide to 20 mg QOD. She also received one time dose of sorbitol. Reports feeling somewhat better. Denies SOB, orthopnea, PND or CP. Was able to walk from the garage to desk today with no issues. Remains on dobutamine 5 mcg. Weight at home 134-137 lbs   Denies LVAD alarms.  Denies driveline trauma, erythema or drainage.  Denies ICD shocks. Reports taking Coumadin as prescribed.  Denies bright red blood per rectum or melena, no dark urine or hematuria.    ROS: All systems negative except as listed in HPI, PMH and Problem List.  Past Medical History  Diagnosis Date  . Paroxysmal supraventricular tachycardia   . Hypokalemia   . Chronic systolic heart failure     a. 08/979 Echo: EF 15%, mod to sev antlat and inflat HK, inf AK, mild to mod MR, mildly/mod reduced RV fxn, Mod TR, PASP .  [name] History of DVT (deep vein thrombosis)   . Dyslipidemia   . HTN (hypertension)   . Nonischemic cardiomyopathy     a. 06/2012 Echo: EF 15%;  b. 06/2012 Cath: nl except luminal irregs in LAD.  [name] Ventricular tachycardia     a. s/p ICD  . Goiter 2001  . Implantable cardiac defibrillator- Biotronik 2009    Single chamber  . [name] (left bundle branch block)     intermittent  . Palliative care patient     Current Outpatient Prescriptions  Medication Sig Dispense Refill  . acetaminophen (TYLENOL) 325 MG tablet Take 2 tablets (650 mg total) by mouth every 6 (six) hours as needed.  20 tablet  2  . amiodarone (PACERONE) 100 MG tablet Take 1 tablet (100 mg total) by mouth daily.  30 tablet  2  . amLODipine (NORVASC) 5 MG tablet TAKE 1 TABLET BY MOUTH DAILY.  30 tablet  PRN  . aspirin 325 MG EC tablet Take 1 tablet (325 mg total) by mouth daily.  30 tablet  2  . citalopram (CELEXA) 40 MG tablet Take 1 tablet (40 mg total) by mouth daily.  90 tablet  3  . cyanocobalamin (TH VITAMIN B12) 100 MCG tablet Take 1 tablet (100 mcg total) by mouth daily.  30 tablet  2  . DOBUTamine (DOBUTREX) 4-5 MG/ML-% infusion Inject 420.6 mcg/min into the vein continuous.  250 mL  6  . docusate sodium (COLACE) 100 MG capsule Take 100 mg by mouth daily.      . ferrous sulfate 324 (65 FE) MG TBEC Take 1 tablet by mouth daily.      . hydrochlorothiazide (HYDRODIURIL) 25 MG tablet Take 1 tablet (25 mg total) by mouth daily.  30 tablet  3  . levalbuterol (XOPENEX HFA)  45 MCG/ACT inhaler Inhale 2 puffs into the lungs every 4 (four) hours as needed for wheezing.  1 Inhaler  12  . pantoprazole (PROTONIX) 40 MG tablet Take 1 tablet (40 mg total) by mouth daily.  90 tablet  3  . potassium chloride (K-DUR) 10 MEQ tablet Take 40 meq daily  120 tablet  2  . sildenafil (REVATIO) 20 MG tablet Take 1 tablet (20 mg total) by mouth 3 (three) times daily.  90 tablet  6  . simvastatin (ZOCOR) 10 MG tablet Take 1 tablet (10 mg total) by mouth at bedtime.  30 tablet  2  . spironolactone (ALDACTONE) 25 MG tablet Take 1 tablet (25 mg total) by mouth daily.  30 tablet  2  . torsemide (DEMADEX) 20 MG tablet every other day.       . warfarin (COUMADIN) 5 MG tablet As directed  30 tablet  2   No current facility-administered medications for this  encounter.    Filed Vitals:   08/15/13 1430  BP: 74/0  Pulse: 86  Height: 5\' 3"  (1.6 m)  Weight: 140 lb 3.2 oz (63.594 kg)  SpO2: 98%    LVAD interrogation reveals:  Speed: 9400  Flow: 4.3 Power: 5.0 PI: 5.0  Alarms: none  Events: 2-5 PI events a day Fixed speed: 9400  Low speed limit: 8800    I reviewed the LVAD parameters from today, and compared the results to the patient's prior recorded data.  No programming changes were made.  The LVAD is functioning within specified parameters.  The patient performs LVAD self-test daily.  LVAD interrogation was negative for any significant power changes, alarms or PI events/speed drops.  LVAD equipment check completed and is in good working order.  Back-up equipment present.   LVAD education done on emergency procedures and precautions and reviewed exit site care.   PHYSICAL EXAM: General:  Well appearing, NAD; daughter present HEENT: normal Neck: supple. JVP 6-7. Carotids 2+ bilaterally; no bruits. No lymphadenopathy or thryomegaly appreciated. Cor: LVAD hum Lungs: clear with decreased BS throughout Abdomen: soft, nontender, + distended. No hepatosplenomegaly. No bruits or masses. Good bowel sounds. Driveline site looks good. Anchor in place Extremities: no cyanosis, clubbing, rash, tr edema. PICC line site ok  Neuro: alert & orientedx3, cranial nerves grossly intact. Moves all 4 extremities w/o difficulty. Affect pleasant.  ASSESSMENT & PLAN:  1. Chronic biventricular HF: s/p HM II LVAD. - NYHA II-III symptoms, much improved from last visit. Volume status improved and pro-BNP down with increase in diuretics. Will continue torsemide 20 mg QOD and HCTZ 25 mg daily. - No bb with RV failure and dobutamine - Patient is likely not going to be a transplant candidate with her restrictive lung disease. She is tired of carrying dobutamine bag around. With improved symptoms will try to wean slowly and will turn down to 4 mcg/min. Stressed the  importance of calling if she notices andy increased HF symptoms.  - Will continue spiro 25 mg daily. No ACE-ARB currently with CKD. - Check BMET next week.   - Reinforced the need and importance of daily weights, a low sodium diet, and fluid restriction (less than 2 L a day). Instructed to call the HF clinic if weight increases more than 3 lbs overnight or 5 lbs in a week.  2. RV failure -  - as above will try to wean dobutamine down to 4 mcg/min. Not currently on digoxin, could consider trying to add but don't want to make  to many changes. If we are able to continue to wean dobutamine off could consider increasing sildenafil to 40 mg TID. 3. CKD stage III - baseline Cr 1.4-1.6. Will check BMET today.  4. H/O VT s/p Biotronik ICD - continue amiodarone 100 mg daily.  5. COPD  - She has restrictive lung disease after LVAD placement. Last CT showed small R pleural effusion slightly enlarged from previous. SOB much improved if it worsens could consider tapping effusion (not sure would help) Follow up with Dr. Kendrick Fries in 2 weeks.  6. LVAD - stable. All parameters within normal limits. Check LDH, BMET, pro-BNP and INR today 7. HTN - MAP improved with diuresis. Continue norvasc, HCTZ and spiro at current doses.  8. Depression - On full dose celexa 40 mg daily and still complaining of depression. Asked her to follow up with PCP. Will discuss with Kaitlin [name] -Palliative/Psych to see if she has any suggestions. She is not open to talking with anyone such as SW or psychologist. 9. Constipation - Start ducolax 5 mg daily. Continue colace   F/U 3 weeks Kaitlin Rud Kaitlin [name]-C 2:32 PM  Patient seen and examined with Kaitlin Potash, Kaitlin [name]. We discussed all aspects of the encounter. I agree with the assessment and plan as stated above. She continues to improve slowly. Not transplant candidate due to lung disease. Will try to wean dobutamine slowly but suspect she will continue to struggle with RHF. Continue to  follow closely. Volume status looks ok.  Alferd Obryant R Aleathia Purdy,MD 10:30 AM

## 2013-08-15 NOTE — Patient Instructions (Addendum)
1. Start Dulcolax (for constipation) 5 mg daily (may hold if develop diarrhea) 2. Take coumadin 5 mg today then change weekly dose to 5 mg daily except 2.5 mg MWF 3.  Re-check labs next Thursday per HH 4.  Return to VAD clinic 3 weeks    LVAD Annual Celebration - Sep 26, 2013  6:00 - 8:00 pm - [facility] classroom 29, 30, 30 (Near the cafeteria and cardiac rehab on ground floor)

## 2013-08-21 ENCOUNTER — Encounter: Payer: Self-pay | Admitting: Internal Medicine

## 2013-08-23 ENCOUNTER — Ambulatory Visit (HOSPITAL_COMMUNITY): Payer: Self-pay | Admitting: *Deleted

## 2013-08-23 LAB — PROTIME-INR: INR: 2.5 — AB (ref <=1.1)

## 2013-08-24 ENCOUNTER — Encounter: Payer: Self-pay | Admitting: Internal Medicine

## 2013-08-24 ENCOUNTER — Telehealth (HOSPITAL_COMMUNITY): Payer: Self-pay | Admitting: *Deleted

## 2013-08-24 NOTE — Telephone Encounter (Signed)
[facility] called concerning WBC yesterday of 14.1. PICC line site without redness, but pt reports night sweats for 2 nights. Asked for repeat BMP Monday 08/27/13 per Ulla Potash, NP.  Spoke with patent and she feels "good"; denies any redness, tenderness, drainage from LVAD driveline exit site. Asked pt to record temperature daily and call VAD pager if any fever, chills, or change in driveline exit site. Labs will be repeated Monday per Home Health. Pt verbalized understanding of same.

## 2013-08-28 ENCOUNTER — Ambulatory Visit (INDEPENDENT_AMBULATORY_CARE_PROVIDER_SITE_OTHER)
Admission: RE | Admit: 2013-08-28 | Discharge: 2013-08-28 | Disposition: A | Discharge status: Home / Self Care | Payer: Commercial Managed Care - HMO | Source: Ambulatory Visit | Attending: Pulmonary Disease | Admitting: Pulmonary Disease

## 2013-08-28 ENCOUNTER — Ambulatory Visit: Payer: Commercial Managed Care - HMO | Admitting: Pulmonary Disease

## 2013-08-28 ENCOUNTER — Encounter: Payer: Self-pay | Admitting: Pulmonary Disease

## 2013-08-28 VITALS — HR 98 | Resp 15 | Ht 63.0 in | Wt 133.0 lb

## 2013-08-28 DIAGNOSIS — J811 Chronic pulmonary edema: Secondary | ICD-10-CM

## 2013-08-28 DIAGNOSIS — J9610 Chronic respiratory failure, unspecified whether with hypoxia or hypercapnia: Secondary | ICD-10-CM

## 2013-08-28 NOTE — Patient Instructions (Signed)
Your lung function test (Total lung capacity) in 2014 was 3.78L, in January it was 2.6L.  We would like it to be closer to 3.78L  We will call you with results of your lung function test and Chest X-ray  We will see you back as needed

## 2013-08-28 NOTE — Progress Notes (Signed)
Subjective:    Patient ID: Kaitlin [name], female    DOB: Oct 07, 1945, 68 y.o.   MRN: [id number]  Synopsis:: Advanced systolic heart failure requiring LVAD placement in 2014. As part of the workup for the LVAD and potentially heart transplant she was found to have restrictive lung disease that developed after LVAD placement. Found to have bilateral pleural effusions. Left side was loculated, right side was free flowing and larger.  HPI  08/28/2013 ROV > Kaitlin [name] has done well since the last visit. She had an increased dose of diuretic added on her last visit with cardiothoracic surgery and her edema has improved since then. Notably, her shortness of breath has improved markedly since the last visit. She feels that her energy level is higher. She has not had significant chest pain. Her ankle swelling has improved somewhat but is still present. She does not have a cough or fever or chills.  Past Medical History  Diagnosis Date  . Paroxysmal supraventricular tachycardia   . Hypokalemia   . Chronic systolic heart failure     a. 4/09812/2014 Echo: EF 15%, mod to sev antlat and inflat HK, inf AK, mild to mod MR, mildly/mod reduced RV fxn, Mod TR, PASP 46mmHg.  [name]. History of DVT (deep vein thrombosis)   . Dyslipidemia   . HTN (hypertension)   . Nonischemic cardiomyopathy     a. 06/2012 Echo: EF 15%;  b. 06/2012 Cath: nl except luminal irregs in LAD.  [name]. Ventricular tachycardia     a. s/p ICD  . Goiter 2001  . Implantable cardiac defibrillator- Biotronik 2009    Single chamber  . [name] (left bundle branch block)     intermittent  . Palliative care patient      Review of Systems     Objective:   Physical Exam Filed Vitals:   08/28/13 1408  Pulse: 98  Resp: 15  Height: 5\' 3"  (1.6 m)  Weight: 133 lb (60.328 kg)  SpO2: 96%   RA  Gen: Chronically Ill-appearing, no acute distress HEENT: NCAT, PERRL, EOMi, OP clear, neck supple without masses PULM: CTA B, slightly diminished left  base CV: Mechanical rumbling from LVAD, JVD AB: BS+, soft, nontender, no hsm Ext: warm, mild pitting ankle edema, no clubbing, no cyanosis        Assessment & Plan:   Chronic respiratory failure She had restrictive lung disease noted after the LVAD placement. There was a small loculated pleural effusion in the left lung and on the March 2015 CT chest there is noted to be a growing right pleural effusion. This is actually improved significantly on the most recent chest x-ray. Presumably the right pleural effusion was due to a transudate in the setting of her congestive heart failure.  Plan: -Repeat chest x-ray to ensure that the right pleural effusion has gone away -Continue current diuretic strategy -Repeat pulmonary function testing to see if there has been any improvement in the total lung capacity now at the pleural effusion has gone away. -follow up PRN    Updated Medication List Outpatient Encounter Prescriptions as of 08/28/2013  Medication Sig  . acetaminophen (TYLENOL) 325 MG tablet Take 2 tablets (650 mg total) by mouth every 6 (six) hours as needed.  [name]. amiodarone (PACERONE) 100 MG tablet Take 1 tablet (100 mg total) by mouth daily.  [name]. amLODipine (NORVASC) 5 MG tablet TAKE 1 TABLET BY MOUTH DAILY.  [name]. aspirin 325 MG EC tablet Take 1 tablet (325 mg total) by mouth daily.  .[name]  bisacodyl (DULCOLAX) 5 MG EC tablet Take 1 tablet (5 mg total) by mouth daily as needed for moderate constipation.  . citalopram (CELEXA) 40 MG tablet Take 1 tablet (40 mg total) by mouth daily.  . cyanocobalamin (TH VITAMIN B12) 100 MCG tablet Take 1 tablet (100 mcg total) by mouth daily.  . DOBUTamine (DOBUTREX) 4-5 MG/ML-% infusion Inject 420.6 mcg/min into the vein continuous.  [name] docusate sodium (COLACE) 100 MG capsule Take 100 mg by mouth daily.  . ferrous sulfate 324 (65 FE) MG TBEC Take 1 tablet by mouth daily.  . hydrochlorothiazide (HYDRODIURIL) 25 MG tablet Take 1 tablet (25 mg total) by mouth  daily.  . pantoprazole (PROTONIX) 40 MG tablet Take 1 tablet (40 mg total) by mouth daily.  . potassium chloride (K-DUR) 10 MEQ tablet Take 40 meq daily  . sildenafil (REVATIO) 20 MG tablet Take 1 tablet (20 mg total) by mouth 3 (three) times daily.  . simethicone (MYLICON) 80 MG chewable tablet Chew 80 mg by mouth 2 (two) times daily.  . simvastatin (ZOCOR) 10 MG tablet Take 1 tablet (10 mg total) by mouth at bedtime.  [name] spironolactone (ALDACTONE) 25 MG tablet Take 1 tablet (25 mg total) by mouth daily.  [name] torsemide (DEMADEX) 20 MG tablet 20 mg every other day.   . warfarin (COUMADIN) 5 MG tablet As directed

## 2013-08-29 ENCOUNTER — Telehealth: Payer: Self-pay

## 2013-08-29 NOTE — Telephone Encounter (Signed)
Pt aware of results.  Nothing further needed.  

## 2013-08-29 NOTE — Telephone Encounter (Signed)
Message copied by Iliya Spivack L on Wed Aug 29, 2013 10:20 AM ------      Message from: Max Fickle B      Created: Wed Aug 29, 2013  9:29 AM       A,      Please let her know that this showed that the right side effusion is still gone. This is good news.      B ------

## 2013-08-29 NOTE — Assessment & Plan Note (Signed)
She had restrictive lung disease noted after the LVAD placement. There was a small loculated pleural effusion in the left lung and on the March 2015 CT chest there is noted to be a growing right pleural effusion. This is actually improved significantly on the most recent chest x-ray. Presumably the right pleural effusion was due to a transudate in the setting of her congestive heart failure.  Plan: -Repeat chest x-ray to ensure that the right pleural effusion has gone away -Continue current diuretic strategy -Repeat pulmonary function testing to see if there has been any improvement in the total lung capacity now at the pleural effusion has gone away. -follow up PRN

## 2013-09-05 ENCOUNTER — Ambulatory Visit (HOSPITAL_COMMUNITY): Payer: Self-pay | Admitting: *Deleted

## 2013-09-05 ENCOUNTER — Ambulatory Visit (HOSPITAL_COMMUNITY)
Admission: RE | Admit: 2013-09-05 | Discharge: 2013-09-05 | Disposition: A | Discharge status: Home / Self Care | Payer: Medicare HMO | Source: Ambulatory Visit | Attending: Internal Medicine | Admitting: Internal Medicine

## 2013-09-05 ENCOUNTER — Other Ambulatory Visit (HOSPITAL_COMMUNITY): Payer: Self-pay | Admitting: *Deleted

## 2013-09-05 ENCOUNTER — Telehealth (HOSPITAL_COMMUNITY): Payer: Self-pay | Admitting: *Deleted

## 2013-09-05 VITALS — BP 98/0 | HR 86 | Ht 63.0 in | Wt 141.8 lb

## 2013-09-05 DIAGNOSIS — N189 Chronic kidney disease, unspecified: Secondary | ICD-10-CM

## 2013-09-05 DIAGNOSIS — I5022 Chronic systolic (congestive) heart failure: Secondary | ICD-10-CM

## 2013-09-05 DIAGNOSIS — F32A Depression, unspecified: Secondary | ICD-10-CM

## 2013-09-05 DIAGNOSIS — Z7901 Long term (current) use of anticoagulants: Secondary | ICD-10-CM

## 2013-09-05 DIAGNOSIS — I509 Heart failure, unspecified: Secondary | ICD-10-CM

## 2013-09-05 DIAGNOSIS — Z95811 Presence of heart assist device: Secondary | ICD-10-CM

## 2013-09-05 DIAGNOSIS — I50810 Right heart failure, unspecified: Secondary | ICD-10-CM

## 2013-09-05 DIAGNOSIS — N179 Acute kidney failure, unspecified: Secondary | ICD-10-CM

## 2013-09-05 DIAGNOSIS — E876 Hypokalemia: Secondary | ICD-10-CM

## 2013-09-05 DIAGNOSIS — I10 Essential (primary) hypertension: Secondary | ICD-10-CM

## 2013-09-05 DIAGNOSIS — I5023 Acute on chronic systolic (congestive) heart failure: Secondary | ICD-10-CM

## 2013-09-05 DIAGNOSIS — F3289 Other specified depressive episodes: Secondary | ICD-10-CM

## 2013-09-05 DIAGNOSIS — F329 Major depressive disorder, single episode, unspecified: Secondary | ICD-10-CM

## 2013-09-05 LAB — PROTIME-INR
INR: 2.8 — ABNORMAL HIGH (ref 0.00–1.49)
PROTHROMBIN TIME: 28.5 s — AB (ref 11.6–15.2)

## 2013-09-05 LAB — CBC
HCT: 32.7 % — ABNORMAL LOW (ref 36.0–46.0)
Hemoglobin: 10.8 g/dL — ABNORMAL LOW (ref 12.0–15.0)
MCH: 29 pg (ref 26.0–34.0)
MCHC: 33 g/dL (ref 30.0–36.0)
MCV: 87.9 fL (ref 78.0–100.0)
Platelets: 265 10*3/uL (ref 150–400)
RBC: 3.72 MIL/uL — ABNORMAL LOW (ref 3.87–5.11)
RDW: 16.4 % — ABNORMAL HIGH (ref 11.5–15.5)
WBC: 7.8 10*3/uL (ref 4.0–10.5)

## 2013-09-05 LAB — BASIC METABOLIC PANEL
BUN: 37 mg/dL — ABNORMAL HIGH (ref 6–23)
CALCIUM: 10.6 mg/dL — AB (ref 8.4–10.5)
CO2: 26 mEq/L (ref 19–32)
Chloride: 100 mEq/L (ref 96–112)
Creatinine, Ser: 1.65 mg/dL — ABNORMAL HIGH (ref 0.50–1.10)
GFR calc Af Amer: 37 mL/min — ABNORMAL LOW (ref >90)
GFR, EST NON AFRICAN AMERICAN: 32 mL/min — AB (ref >90)
Glucose, Bld: 105 mg/dL — ABNORMAL HIGH (ref 70–99)
Potassium: 3 mEq/L — ABNORMAL LOW (ref 3.7–5.3)
SODIUM: 139 meq/L (ref 137–147)

## 2013-09-05 LAB — LACTATE DEHYDROGENASE: LDH: 256 U/L — AB (ref 94–250)

## 2013-09-05 LAB — PRO B NATRIURETIC PEPTIDE: Pro B Natriuretic peptide (BNP): 1291 pg/mL — ABNORMAL HIGH (ref 0–125)

## 2013-09-05 MED ORDER — AMLODIPINE BESYLATE 10 MG PO TABS: 10.0000 mg | ORAL_TABLET | Freq: Every day | ORAL | Status: AC

## 2013-09-05 NOTE — Telephone Encounter (Signed)
Called pt per Ulla Potash, NP - instructed pt to take extra K-Dur 40 meq today and extra 20 meq tomorrow. She is to continue 40 meq daily dosing. Also, informed pt we will not wean home Dobutamine at this time. HH to repeat labs next week.  Pt verbalized understanding of same.

## 2013-09-05 NOTE — Progress Notes (Signed)
Symptom  Yes  No  Details   Angina        x Activity:   Claudication        x How far:   Syncope        x When:   Stroke        x   Orthopnea        x How many pillows:   1  PND        x How often:  CPAP      N/A How many hrs:   Pedal edema               x   Abd fullness               x    N&V        x   Diaphoresis        x When:  Bleeding       x   Urine color          yellow  SOB        x Activity:    Palpitations        x When:  ICD shock        x   Hospitlizaitons        x When/where/why:  ED visit        x When/where/why:  Other MD              x When/who/why:     Activity    Household chores; no formal exercise  Fluid      < 2000 daily  Diet     Low sodium   Vital signs: HR:  86 MAP BP:  98; 96 O2 Sat:  97 Wt:  141.8 lbs Last wt: 140.2  lbs Ht: 5'3"  LVAD interrogation reveals:  Speed: 9400 Flow:  3.8 Power:  5.2 PI: 4.7 Alarms:  none Events:  2 - 3 PI events/day Fixed speed: 9400 Low speed limit:  8800  LVAD exit site:  Well healed and incorporated. The velour is fully implanted at exit site. Dressing dry and intact. No erythema or drainage. Pt does not wear stabilization device due to skin irritation. Stressed importance of keeping drive line secure and free from trauma to prevent damage/infections. Pt verbalized understanding of same. Driveline dressing is being changed weekly per sterile technique using  Sorbaview dressing with biopatch on exit site. Pt denies fever or chills. Pt asking if Sorbaview dressing can be substituted with Tegaderm dressing (like the one being used on her PICC line site). Pt states it does not cause itching like the Sorbaview dressing. Asked her to bring a Tegaderm dressing to next clinic visit and we will evaluate.  Pt/caregiver deny any alarms or VAD equipment issues. Pt is performing daily controller and system monitor self tests along with completing weekly and monthly maintenance for LVAD equipment.

## 2013-09-05 NOTE — Progress Notes (Signed)
Patient ID: Kaitlin [name], female   DOB: [birth year], 68 y.o.   MRN: [id number]  PCP: Dr. Loleta Chance Pulmonologist: Dr. Kendrick Fries  Subjective: Kaitlin [name] is a 68 y/o woman with h/o severe HTN, [name], VT s/ Biotronik ICD,CKD (Baseline Cr 1.5-1.6) , severe biventricular HF due to NICM.   8/18 Underwent HM II LVAD implant (under destination criteria) along with TV repair (tricuspid annuloplasty). Post-op couse c/b RHF requiring take back to the OR for bleeding and temproray RVAD support. Subsequently started on dobutamine for RV support.  Recent PFTs with severe airflow limitation FEV1 < 1.0L  06/13/13 completed 200 feet in 2 minutes, but had to stop due to SOB  Follow up: Last visit decreased dobutamine to 4 mcg which she tolerated. Saw Dr. Kendrick Fries on 4/28 and had repeat CXR showing pleural effusion gone. Has had weekly labs and a few weeks ago CBC jumped to 14, repeat was 9. Denies SOB, orthopnea, CP or palpitations. Was able to walk from new main entrance to the hospital to 6th floor to visit family member the other day with no issues. Weight stable 129-133 lbs. Has not needed any extra torsemide.  Denies LVAD alarms.  Denies driveline trauma, erythema or drainage.  Denies ICD shocks. Reports taking Coumadin as prescribed.  Denies bright red blood per rectum or melena, no dark urine or hematuria.    ROS: All systems negative except as listed in HPI, PMH and Problem List.  Past Medical History  Diagnosis Date  . Paroxysmal supraventricular tachycardia   . Hypokalemia   . Chronic systolic heart failure     a. 01/6758 Echo: EF 15%, mod to sev antlat and inflat HK, inf AK, mild to mod MR, mildly/mod reduced RV fxn, Mod TR, PASP .  [name] History of DVT (deep vein thrombosis)   . Dyslipidemia   . HTN (hypertension)   . Nonischemic cardiomyopathy     a. 06/2012 Echo: EF 15%;  b. 06/2012 Cath: nl except luminal irregs in LAD.  [name] Ventricular tachycardia     a. s/p ICD  . Goiter 2001  .  Implantable cardiac defibrillator- Biotronik 2009    Single chamber  . [name] (left bundle branch block)     intermittent  . Palliative care patient     Current Outpatient Prescriptions  Medication Sig Dispense Refill  . acetaminophen (TYLENOL) 325 MG tablet Take 2 tablets (650 mg total) by mouth every 6 (six) hours as needed.  20 tablet  2  . amiodarone (PACERONE) 100 MG tablet Take 1 tablet (100 mg total) by mouth daily.  30 tablet  2  . amLODipine (NORVASC) 5 MG tablet TAKE 1 TABLET BY MOUTH DAILY.  30 tablet  PRN  . aspirin 325 MG EC tablet Take 1 tablet (325 mg total) by mouth daily.  30 tablet  2  . bisacodyl (DULCOLAX) 5 MG EC tablet Take 1 tablet (5 mg total) by mouth daily as needed for moderate constipation.  30 tablet  0  . citalopram (CELEXA) 40 MG tablet Take 1 tablet (40 mg total) by mouth daily.  90 tablet  3  . DOBUTamine (DOBUTREX) 4-5 MG/ML-% infusion Inject 420.6 mcg/min into the vein continuous.  250 mL  6  . docusate sodium (COLACE) 100 MG capsule Take 100 mg by mouth daily.      . ferrous sulfate 324 (65 FE) MG TBEC Take 1 tablet by mouth daily.      . hydrochlorothiazide (HYDRODIURIL) 25 MG tablet Take 1 tablet (  25 mg total) by mouth daily.  30 tablet  3  . pantoprazole (PROTONIX) 40 MG tablet Take 1 tablet (40 mg total) by mouth daily.  90 tablet  3  . potassium chloride (K-DUR) 10 MEQ tablet Take 40 meq daily  120 tablet  2  . sildenafil (REVATIO) 20 MG tablet Take 1 tablet (20 mg total) by mouth 3 (three) times daily.  90 tablet  6  . simethicone (MYLICON) 80 MG chewable tablet Chew 80 mg by mouth 2 (two) times daily.      . simvastatin (ZOCOR) 10 MG tablet Take 1 tablet (10 mg total) by mouth at bedtime.  30 tablet  2  . spironolactone (ALDACTONE) 25 MG tablet Take 1 tablet (25 mg total) by mouth daily.  30 tablet  2  . torsemide (DEMADEX) 20 MG tablet 20 mg every other day.       . warfarin (COUMADIN) 5 MG tablet As directed  30 tablet  2   No current  facility-administered medications for this encounter.    Filed Vitals:   09/05/13 1323  BP: 98/0  Pulse: 86  Height: 5\' 3"  (1.6 m)  Weight: 141 lb 12.8 oz (64.32 kg)  SpO2: 97%    LVAD interrogation reveals:  Speed: 9400  Flow: 4.2 Power: 5.2 PI: 5.1 Alarms: none  Events: 2-5 PI events a day Fixed speed: 9400  Low speed limit: 8800    I reviewed the LVAD parameters from today, and compared the results to the patient's prior recorded data.  No programming changes were made.  The LVAD is functioning within specified parameters.  The patient performs LVAD self-test daily.  LVAD interrogation was negative for any significant power changes, alarms or PI events/speed drops.  LVAD equipment check completed and is in good working order.  Back-up equipment present.   LVAD education done on emergency procedures and precautions and reviewed exit site care.   PHYSICAL EXAM: General:  Well appearing, NAD; daughter present HEENT: normal Neck: supple. JVP 7 with prominent CV waves; Carotids 2+ bilaterally; no bruits. No lymphadenopathy or thryomegaly appreciated. Cor: LVAD hum Lungs: CTA Abdomen: soft, nontender, no distention. No hepatosplenomegaly. No bruits or masses. Good bowel sounds. Driveline site looks good. Anchor in place Extremities: no cyanosis, clubbing, rash, tr edema. PICC line site ok  Neuro: alert & orientedx3, cranial nerves grossly intact. Moves all 4 extremities w/o difficulty. Affect pleasant.  ASSESSMENT & PLAN:  1. Chronic biventricular HF: s/p HM II LVAD. - NYHA II symptoms. She reports feeling great and able to walk over 500 ft with no SOB. Volume status stable will continue torsemide 20 mg QOD and HCTZ 25 mg daily.  - No bb with RV failure and dobutamine - Will try to wean dobutamine to 3 mcg/min. Stressed the importance of calling if she notices andy increased HF symptoms.  - Will continue spiro 25 mg daily. No ACE-ARB currently with CKD. - Increase norvasc to 10  mg daily with elevated MAP.  - BMET and pro-BNP today. BMEt weekly with [facility] while on dobutamine.  - Reinforced the need and importance of daily weights, a low sodium diet, and fluid restriction (less than 2 L a day). Instructed to call the HF clinic if weight increases more than 3 lbs overnight or 5 lbs in a week.  2. RV failure -  - as above will try to wean dobutamine down to 3 mcg/min. If we are able to continue to wean dobutamine off could consider increasing  sildenafil to 40 mg TID if needed. Will repeat RAMP ECHO next visit to assess RV.  3. CKD stage III - baseline Cr 1.4-1.6. Will check BMET today.  4. H/O VT s/p Biotronik ICD - continue amiodarone 100 mg daily. Last TSH nl, but free T4 slightly elevated. Will recheck free T4 with weekly labs. 5. COPD  - She has restrictive lung disease after LVAD placement. Last CT showed small R pleural effusion slightly enlarged from previous, however last CXR last week showed pleural effusion gone. With improvement in breathing pulmonology repeating PFTs next week. Hopeful that they will improve if so [facility] for reconsideration of tx. 6. LVAD - stable. All parameters within normal limits. Check LDH, BMET, pro-BNP and INR today 7. HTN - MAP slightly elevated. As above increase norvasc.  8. Depression - Much improved and reports feeling happier. Will continue celexa 40 mg daily.    F/U 1 month with RAMP ECHO Aundria RudAli B Mirna Sutcliffe NP-C 1:46 PM  Addendum: After reviewing labs, Creatinine slightly elevated to 1.65 and BUN 37. [facility] hospital labs. Creatinine with HH labs running 1.2-1.3. Will not wean dobutamine currently, will hold off until after PFTs. If after PFTs still feeling great will try to continue to wean slowly. Discussed with Dr Gala RomneyBensimhon and will not change any medications currently and continue to follow Cr closely. K+ 3.0, take an additional 40 meq potassium today and 20 meq tomorrow.

## 2013-09-05 NOTE — Patient Instructions (Addendum)
1.  Decrease Dobutamine to 3 mcg/kg/min (Patti from [facility] will decrease dose tomorrow). Home health to check labs next Thursday. 2.  No change in coumadin dose - HH will check home INR two weeks. 3.  Increase Norvasc to 10 mg daily (may take what you have - take two 5 mg tabs until gone). Will send new Rx for 10 mg tabs. 4.  Return to VAD clinic 1 month

## 2013-09-12 ENCOUNTER — Ambulatory Visit (HOSPITAL_COMMUNITY)
Admission: RE | Admit: 2013-09-12 | Discharge: 2013-09-12 | Disposition: A | Discharge status: Home / Self Care | Payer: Medicare HMO | Source: Ambulatory Visit | Attending: Pulmonary Disease | Admitting: Pulmonary Disease

## 2013-09-12 DIAGNOSIS — J811 Chronic pulmonary edema: Secondary | ICD-10-CM

## 2013-09-12 MED ORDER — ALBUTEROL SULFATE (2.5 MG/3ML) 0.083% IN NEBU
2.5000 mg | INHALATION_SOLUTION | Freq: Once | RESPIRATORY_TRACT | Status: AC
  Administered 2013-09-12: 2.5 mg via RESPIRATORY_TRACT

## 2013-09-13 LAB — PULMONARY FUNCTION TEST
DL/VA % PRED: 78 %
DL/VA: 3.47 ml/min/mmHg/L
DLCO COR: 5.63 ml/min/mmHg
DLCO cor % pred: 27 %
DLCO unc % pred: 25 %
DLCO unc: 5.12 ml/min/mmHg
FEF 25-75 PRE: 1.72 L/s
FEF 25-75 Post: 2.51 L/sec
FEF2575-%Change-Post: 45 %
FEF2575-%PRED-POST: 152 %
FEF2575-%Pred-Pre: 104 %
FEV1-%Change-Post: 8 %
FEV1-%PRED-POST: 63 %
FEV1-%Pred-Pre: 58 %
FEV1-POST: 1.06 L
FEV1-PRE: 0.98 L
FEV1FVC-%CHANGE-POST: 0 %
FEV1FVC-%Pred-Pre: 118 %
FEV6-%Change-Post: 9 %
FEV6-%Pred-Post: 56 %
FEV6-%Pred-Pre: 51 %
FEV6-Post: 1.17 L
FEV6-Pre: 1.06 L
FEV6FVC-%Pred-Post: 104 %
FEV6FVC-%Pred-Pre: 104 %
FVC-%Change-Post: 9 %
FVC-%Pred-Post: 53 %
FVC-%Pred-Pre: 48 %
FVC-Post: 1.17 L
FVC-Pre: 1.06 L
PRE FEV1/FVC RATIO: 92 %
PRE FEV6/FVC RATIO: 100 %
Post FEV1/FVC ratio: 91 %
Post FEV6/FVC ratio: 100 %
RV % pred: 72 %
RV: 1.4 L
TLC % pred: 55 %
TLC: 2.56 L

## 2013-09-15 ENCOUNTER — Emergency Department (HOSPITAL_COMMUNITY)
Admission: EM | Admit: 2013-09-15 | Discharge: 2013-09-16 | Disposition: A | Discharge status: Home / Self Care | Payer: Medicare HMO | Attending: Emergency Medicine | Admitting: Emergency Medicine

## 2013-09-15 DIAGNOSIS — I10 Essential (primary) hypertension: Secondary | ICD-10-CM | POA: Insufficient documentation

## 2013-09-15 DIAGNOSIS — Z7982 Long term (current) use of aspirin: Secondary | ICD-10-CM | POA: Insufficient documentation

## 2013-09-15 DIAGNOSIS — Z7901 Long term (current) use of anticoagulants: Secondary | ICD-10-CM | POA: Insufficient documentation

## 2013-09-15 DIAGNOSIS — Z95811 Presence of heart assist device: Secondary | ICD-10-CM

## 2013-09-15 DIAGNOSIS — Z95818 Presence of other cardiac implants and grafts: Secondary | ICD-10-CM | POA: Insufficient documentation

## 2013-09-15 DIAGNOSIS — I471 Supraventricular tachycardia, unspecified: Secondary | ICD-10-CM | POA: Insufficient documentation

## 2013-09-15 DIAGNOSIS — Z86718 Personal history of other venous thrombosis and embolism: Secondary | ICD-10-CM | POA: Insufficient documentation

## 2013-09-15 DIAGNOSIS — R0602 Shortness of breath: Secondary | ICD-10-CM | POA: Insufficient documentation

## 2013-09-15 DIAGNOSIS — Z79899 Other long term (current) drug therapy: Secondary | ICD-10-CM | POA: Insufficient documentation

## 2013-09-15 DIAGNOSIS — Z95810 Presence of automatic (implantable) cardiac defibrillator: Secondary | ICD-10-CM | POA: Insufficient documentation

## 2013-09-15 DIAGNOSIS — R002 Palpitations: Secondary | ICD-10-CM

## 2013-09-15 DIAGNOSIS — E785 Hyperlipidemia, unspecified: Secondary | ICD-10-CM | POA: Insufficient documentation

## 2013-09-15 DIAGNOSIS — I5022 Chronic systolic (congestive) heart failure: Secondary | ICD-10-CM

## 2013-09-15 DIAGNOSIS — R110 Nausea: Secondary | ICD-10-CM | POA: Insufficient documentation

## 2013-09-15 LAB — COMPREHENSIVE METABOLIC PANEL
ALK PHOS: 174 U/L — AB (ref 39–117)
ALT: 22 U/L (ref 0–35)
AST: 25 U/L (ref 0–37)
Albumin: 4.3 g/dL (ref 3.5–5.2)
BILIRUBIN TOTAL: 2.2 mg/dL — AB (ref 0.3–1.2)
BUN: 26 mg/dL — ABNORMAL HIGH (ref 6–23)
CHLORIDE: 98 meq/L (ref 96–112)
CO2: 23 mEq/L (ref 19–32)
Calcium: 10.5 mg/dL (ref 8.4–10.5)
Creatinine, Ser: 1.76 mg/dL — ABNORMAL HIGH (ref 0.50–1.10)
GFR calc Af Amer: 34 mL/min — ABNORMAL LOW (ref >90)
GFR, EST NON AFRICAN AMERICAN: 29 mL/min — AB (ref >90)
Glucose, Bld: 139 mg/dL — ABNORMAL HIGH (ref 70–99)
Potassium: 3.8 mEq/L (ref 3.7–5.3)
SODIUM: 139 meq/L (ref 137–147)
Total Protein: 7.6 g/dL (ref 6.0–8.3)

## 2013-09-15 LAB — CBC WITH DIFFERENTIAL/PLATELET
BASOS ABS: 0 10*3/uL (ref 0.0–0.1)
Basophils Relative: 0 % (ref 0–1)
Eosinophils Absolute: 0.1 10*3/uL (ref 0.0–0.7)
Eosinophils Relative: 1 % (ref 0–5)
HEMATOCRIT: 30.9 % — AB (ref 36.0–46.0)
Hemoglobin: 10.3 g/dL — ABNORMAL LOW (ref 12.0–15.0)
LYMPHS PCT: 11 % — AB (ref 12–46)
Lymphs Abs: 0.8 10*3/uL (ref 0.7–4.0)
MCH: 29.3 pg (ref 26.0–34.0)
MCHC: 33.3 g/dL (ref 30.0–36.0)
MCV: 87.8 fL (ref 78.0–100.0)
MONO ABS: 0.4 10*3/uL (ref 0.1–1.0)
Monocytes Relative: 5 % (ref 3–12)
Neutro Abs: 6 10*3/uL (ref 1.7–7.7)
Neutrophils Relative %: 83 % — ABNORMAL HIGH (ref 43–77)
PLATELETS: 261 10*3/uL (ref 150–400)
RBC: 3.52 MIL/uL — ABNORMAL LOW (ref 3.87–5.11)
RDW: 16.4 % — AB (ref 11.5–15.5)
WBC: 7.2 10*3/uL (ref 4.0–10.5)

## 2013-09-15 LAB — PROTIME-INR
INR: 1.83 — ABNORMAL HIGH (ref 0.00–1.49)
Prothrombin Time: 20.6 seconds — ABNORMAL HIGH (ref 11.6–15.2)

## 2013-09-15 LAB — TSH: TSH: 0.291 u[IU]/mL — ABNORMAL LOW (ref 0.350–4.500)

## 2013-09-15 LAB — PRO B NATRIURETIC PEPTIDE: PRO B NATRI PEPTIDE: 1396 pg/mL — AB (ref 0–125)

## 2013-09-15 LAB — LACTATE DEHYDROGENASE: LDH: 266 U/L — ABNORMAL HIGH (ref 94–250)

## 2013-09-15 MED ORDER — SODIUM CHLORIDE 0.9 % IJ SOLN
10.0000 mL | INTRAMUSCULAR | Status: DC | PRN
  Administered 2013-09-15: 30 mL

## 2013-09-15 MED ORDER — SODIUM CHLORIDE 0.9 % IV BOLUS (SEPSIS)
1000.0000 mL | Freq: Once | INTRAVENOUS | Status: AC
  Administered 2013-09-15: 500 mL via INTRAVENOUS

## 2013-09-15 NOTE — ED Notes (Signed)
lvad pt palpitations for 2 days.  No pain .  Alert no distress

## 2013-09-15 NOTE — ED Notes (Signed)
Dr bensimmohn at the bedside.  Writing orders for this pt for fluids then home

## 2013-09-15 NOTE — ED Provider Notes (Signed)
CSN: [id number]     Arrival date & time 09/15/13  2115 History   First MD Initiated Contact with Patient 09/15/13 2134     Chief Complaint  Patient presents with  . Palpitations     (Consider location/radiation/quality/duration/timing/severity/associated sxs/prior Treatment) Patient is a 68 y.o. female presenting with palpitations. The history is provided by the patient and medical records. No language interpreter was used.  Palpitations Palpitations quality:  Fast Onset quality:  Gradual Duration:  36 hours Timing:  Constant Progression:  Worsening Chronicity:  New Context: not anxiety, not exercise, not nicotine and not stimulant use   Relieved by:  Nothing Worsened by:  Nothing tried Ineffective treatments:  Deep relaxation and bed rest Associated symptoms: nausea and shortness of breath   Risk factors: heart disease (patient has LVAD)     Past Medical History  Diagnosis Date  . Paroxysmal supraventricular tachycardia   . Hypokalemia   . Chronic systolic heart failure     a. 4/78292/2014 Echo: EF 15%, mod to sev antlat and inflat HK, inf AK, mild to mod MR, mildly/mod reduced RV fxn, Mod TR, PASP 46mmHg.  [name]. History of DVT (deep vein thrombosis)   . Dyslipidemia   . HTN (hypertension)   . Nonischemic cardiomyopathy     a. 06/2012 Echo: EF 15%;  b. 06/2012 Cath: nl except luminal irregs in LAD.  [name]. Ventricular tachycardia     a. s/p ICD  . Goiter 2001  . Implantable cardiac defibrillator- Biotronik 2009    Single chamber  . [name] (left bundle branch block)     intermittent  . Palliative care patient    Past Surgical History  Procedure Laterality Date  . None    . Cardiac defibrillator placement  01/2009  . Insertion of implantable left ventricular assist device N/A 12/18/2012    Procedure: INSERTION OF IMPLANTABLE LEFT VENTRICULAR ASSIST DEVICE;  Surgeon: Kerin PernaPeter [name], MD;  [facility];  Service: Open Heart Surgery;  Laterality: N/A;  . Intraoperative transesophageal  echocardiogram N/A 12/18/2012    Procedure: INTRAOPERATIVE TRANSESOPHAGEAL ECHOCARDIOGRAM;  Surgeon: Kerin PernaPeter [name], MD;  [facility];  Service: Open Heart Surgery;  Laterality: N/A;  . Tricuspid valve replacement N/A 12/18/2012    Procedure: TRICUSPID VALVE REPAIR;  Surgeon: Kerin PernaPeter [name], MD;  [facility];  Service: Open Heart Surgery;  Laterality: N/A;  . Insertion of implantable left ventricular assist device N/A 12/18/2012    Procedure: INSERTION OF IMPLANTABLE RIGHT VENTRICULAR ASSIST DEVICE;  Surgeon: Kerin PernaPeter [name], MD;  [facility];  Service: Open Heart Surgery;  Laterality: N/A;  . Removal of centrimag ventricular assist device N/A 12/25/2012    Procedure: REMOVAL OF CENTRIMAG VENTRICULAR ASSIST DEVICE;  Surgeon: Kerin PernaPeter [name], MD;  [facility];  Service: Open Heart Surgery;  Laterality: N/A;  PUMP STANDBY  . Intraoperative transesophageal echocardiogram N/A 12/25/2012    Procedure: INTRAOPERATIVE TRANSESOPHAGEAL ECHOCARDIOGRAM;  Surgeon: Kerin PernaPeter [name], MD;  [facility];  Service: Open Heart Surgery;  Laterality: N/A;  . Esophagogastroduodenoscopy N/A 12/26/2012    Procedure: ESOPHAGOGASTRODUODENOSCOPY (EGD);  Surgeon: Beverley FiedlerJay M Pyrtle, MD;  [facility];  Service: Gastroenterology;  Laterality: N/A;  Bedside  . Sternal closure N/A 12/27/2012    Procedure: STERNAL CLOSURE;  Surgeon: Kerin PernaPeter [name], MD;  [facility];  Service: Thoracic;  Laterality: N/A;  . Mediastinal exploration N/A 12/27/2012    Procedure: MEDIASTINAL EXPLORATION;  Surgeon: Kerin PernaPeter [name], MD;  [facility];  Service:  Thoracic;  Laterality: N/A;   Family History  Problem Relation Age of Onset  . Emphysema Mother   . Heart disease Mother   . Heart attack Father   . Emphysema Brother    History  Substance Use Topics  . Smoking status: Never Smoker   . Smokeless tobacco: Never Used     Comment: Exposed to secondhand smoke from both parents as a child  . Alcohol Use: No   OB  History   Grav Para Term Preterm Abortions TAB SAB Ect Mult Living                 Review of Systems  Respiratory: Positive for shortness of breath.   Cardiovascular: Positive for palpitations.  Gastrointestinal: Positive for nausea.  All other systems reviewed and are negative.     Allergies  Xopenex  Home Medications   Prior to Admission medications   Medication Sig Start Date End Date Taking? Authorizing Provider  acetaminophen (TYLENOL) 325 MG tablet Take 2 tablets (650 mg total) by mouth every 6 (six) hours as needed. 06/13/13   Laurey Morale, MD  amiodarone (PACERONE) 100 MG tablet Take 1 tablet (100 mg total) by mouth daily. 06/13/13   Laurey Morale, MD  amLODipine (NORVASC) 10 MG tablet Take 1 tablet (10 mg total) by mouth daily. 09/05/13   Aundria Rud, NP  aspirin 325 MG EC tablet Take 1 tablet (325 mg total) by mouth daily. 06/13/13   Laurey Morale, MD  bisacodyl (DULCOLAX) 5 MG EC tablet Take 1 tablet (5 mg total) by mouth daily as needed for moderate constipation. 08/15/13   Aundria Rud, NP  citalopram (CELEXA) 40 MG tablet Take 1 tablet (40 mg total) by mouth daily. 06/13/13   Laurey Morale, MD  DOBUTamine (DOBUTREX) 4-5 MG/ML-% infusion Inject 420.6 mcg/min into the vein continuous. 03/23/13   [name] D Filbert Schilder, NP  docusate sodium (COLACE) 100 MG capsule Take 100 mg by mouth daily.    Historical Provider, MD  ferrous sulfate 324 (65 FE) MG TBEC Take 1 tablet by mouth daily. 06/13/13   Dolores Patty, MD  hydrochlorothiazide (HYDRODIURIL) 25 MG tablet Take 1 tablet (25 mg total) by mouth daily. 06/13/13   Laurey Morale, MD  pantoprazole (PROTONIX) 40 MG tablet Take 1 tablet (40 mg total) by mouth daily. 06/13/13   Laurey Morale, MD  potassium chloride (K-DUR) 10 MEQ tablet Take 40 meq daily 06/13/13   Laurey Morale, MD  sildenafil (REVATIO) 20 MG tablet Take 1 tablet (20 mg total) by mouth 3 (three) times daily. 08/02/13   Dolores Patty, MD  simethicone  (MYLICON) 80 MG chewable tablet Chew 80 mg by mouth 2 (two) times daily.    Historical Provider, MD  simvastatin (ZOCOR) 10 MG tablet Take 1 tablet (10 mg total) by mouth at bedtime. 06/13/13   Laurey Morale, MD  spironolactone (ALDACTONE) 25 MG tablet Take 1 tablet (25 mg total) by mouth daily. 06/13/13   Laurey Morale, MD  torsemide (DEMADEX) 20 MG tablet 20 mg every other day.  06/15/13   Dolores Patty, MD  warfarin (COUMADIN) 5 MG tablet As directed 06/13/13   Laurey Morale, MD   BP 86/0  Pulse 84  Temp(Src) 98.5 F (36.9 C) (Oral)  Resp 18  SpO2 98% Physical Exam  Nursing note and vitals reviewed. Constitutional: She is oriented to person, place, and time. She appears well-developed and well-nourished. No  distress.  HENT:  Head: Normocephalic and atraumatic.  Right Ear: External ear normal.  Eyes: Conjunctivae are normal. Pupils are equal, round, and reactive to light.  Neck: Normal range of motion. Neck supple.  Cardiovascular:  With auscultation there is obvious mechanical sounds c/w LVAD  Pulmonary/Chest: Effort normal and breath sounds normal.  Abdominal: Soft. Bowel sounds are normal. She exhibits no distension.  Musculoskeletal: Normal range of motion.  Neurological: She is alert and oriented to person, place, and time.  Skin: Skin is warm and dry.  Psychiatric: She has a normal mood and affect.    ED Course  Procedures (including critical care time) Labs Review Labs Reviewed  CBC WITH DIFFERENTIAL - Abnormal; Notable for the following:    RBC 3.52 (*)    Hemoglobin 10.3 (*)    HCT 30.9 (*)    RDW 16.4 (*)    Neutrophils Relative % 83 (*)    Lymphocytes Relative 11 (*)    All other components within normal limits  COMPREHENSIVE METABOLIC PANEL - Abnormal; Notable for the following:    Glucose, Bld 139 (*)    BUN 26 (*)    Creatinine, Ser 1.76 (*)    Alkaline Phosphatase 174 (*)    Total Bilirubin 2.2 (*)    GFR calc non Af Amer 29 (*)    GFR calc Af  Amer 34 (*)    All other components within normal limits  LACTATE DEHYDROGENASE - Abnormal; Notable for the following:    LDH 266 (*)    All other components within normal limits  PROTIME-INR - Abnormal; Notable for the following:    Prothrombin Time 20.6 (*)    INR 1.83 (*)    All other components within normal limits  TSH - Abnormal; Notable for the following:    TSH 0.291 (*)    All other components within normal limits  PRO B NATRIURETIC PEPTIDE - Abnormal; Notable for the following:    Pro B Natriuretic peptide (BNP) 1396.0 (*)    All other components within normal limits  T3  T4    Imaging Review No results found.   EKG Interpretation None      Date: 09/15/2013  Rate: 91  Rhythm: normal sinus rhythm  QRS Axis: normal  Intervals: PR prolonged  ST/T Wave abnormalities: nonspecific ST changes  Conduction Disutrbances:right bundle branch block  Narrative Interpretation:   Old EKG Reviewed: unchanged   MDM   Final diagnoses:  Palpitations  Chronic systolic heart failure  LVAD (left ventricular assist device) present    Patient is a 68 y.o. female with PMH as above and relevant for NICM who presents to the emergency department with palpitations and shortness of breath. Of note, patient has a LVAD in place and is followed by cardiology here. Vital signs were unremarkable including no fever, no tachycardia, no hypoxia.  Physical exam as above. Cardiology made aware of patient's presentation and it was felt that EKG, labs, and chest x-ray were warranted.  After their evaluation they felt patient was volume down and recommended IVF and checking eletrolytes. EKG shows no acute changes from prior. Review of other workup shows lytes acceptable and no other acute abnormality present.  Patient given IVF and after her symptoms resolved.  She was able to ambulate without difficulty.  Will discharge with instructions to follow up with cardiology next week and strict return precautions  including return of symptoms, CP, fever, or with any other concerning symptoms.  Johnney Ou, MD 09/16/13 [phone]

## 2013-09-15 NOTE — Consult Note (Signed)
Referring Physician: ER Primary Physician: Primary Cardiologist: DB Reason for Consultation: Palpitations   HPI: Kaitlin [name] is a 68 y/o woman with h/o severe HTN, [name], VT s/ Biotronik ICD,CKD (Baseline Cr 1.5-1.6) , severe biventricular HF due to NICM.   8/18 Underwent HM II LVAD implant (under destination criteria) along with TV repair (tricuspid annuloplasty). Post-op couse c/b RHF requiring take back to the OR for bleeding and temproray RVAD support. Subsequently started on dobutamine for RV support.   Recently been doing very well. Dobutamine weaned down to 3 mcg/kg/min. On demadex 20 mg every other day. Over past two days frequent palpitations particularly when standing. No CP, syncope, SOB or edema. No ICD shock. Weight below baseline at 130.   LVAD interrogation reveals:  Speed: 9400  Flow: 3.7 Power: 5.0  PI: 4.3 (down from 5.1) Alarms: none  Events: ~20 PI events over past 2 days.  Fixed speed: 9400  Low speed limit: 8800    Review of Systems:     Cardiac Review of Systems: {Y] = yes [ ]  = no  Chest Pain [    ]  Resting SOB [   ] Exertional SOB  [  ]  Orthopnea [  ]   Pedal Edema [   ]    Palpitations [name]  ] Syncope  [  ]   Presyncope [   ]  General Review of Systems: [Y] = yes [  ]=no Constitional: recent weight change [  ]; anorexia [  ]; fatigue [  ]; nausea [  ]; night sweats [  ]; fever [  ]; or chills [  ];                                                                      Eyes : blurred vision [  ]; diplopia [   ]; vision changes [  ];  Amaurosis fugax[  ]; Resp: cough [  ];  wheezing[  ];  hemoptysis[  ];  PND [  ];  GI:  gallstones[  ], vomiting[  ];  dysphagia[  ]; melena[  ];  hematochezia [  ]; heartburn[  ];   GU: kidney stones [  ]; hematuria[  ];   dysuria [  ];  nocturia[  ]; incontinence [  ];             Skin: rash, swelling[  ];, hair loss[  ];  peripheral edema[  ];  or itching[  ]; Musculosketetal: myalgias[  ];  joint swelling[  ];  joint  erythema[  ];  joint pain[y  ];  back pain[  ];  Heme/Lymph: bruising[  ];  bleeding[  ];  anemia[  ];  Neuro: TIA[  ];  headaches[  ];  stroke[  ];  vertigo[  ];  seizures[  ];   paresthesias[  ];  difficulty walking[  ];  Psych:depression[  ]; anxiety[  ];  Endocrine: diabetes[  ];  thyroid dysfunction[  ];  Other:  Past Medical History  Diagnosis Date  . Paroxysmal supraventricular tachycardia   . Hypokalemia   . Chronic systolic heart failure     a. 05/4429 Echo: EF 15%, mod to sev antlat and inflat HK, inf  AK, mild to mod MR, mildly/mod reduced RV fxn, Mod TR, PASP .  [name] History of DVT (deep vein thrombosis)   . Dyslipidemia   . HTN (hypertension)   . Nonischemic cardiomyopathy     a. 06/2012 Echo: EF 15%;  b. 06/2012 Cath: nl except luminal irregs in LAD.  [name] Ventricular tachycardia     a. s/p ICD  . Goiter 2001  . Implantable cardiac defibrillator- Biotronik 2009    Single chamber  . [name] (left bundle branch block)     intermittent  . Palliative care patient      (Not in a hospital admission)     Infusions:   Allergies  Allergen Reactions  . Xopenex [Levalbuterol Hcl]     Rash in mouth    History   Social History  . Marital Status: Single    Spouse Name: N/A    Number of Children: N/A  . Years of Education: N/A   Occupational History  . 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 Smoking status: Never Smoker   . Smokeless tobacco: Never Used     Comment: Exposed to secondhand smoke from both parents as a child  . Alcohol Use: No  . Drug Use: No  . Sexual Activity: Not on file   Other Topics Concern  . 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 Lives in [place], not married. 2 daughters/           Family History  Problem Relation Age of Onset  . Emphysema Mother   . Heart disease Mother   . Heart attack Father   . Emphysema Brother     PHYSICAL EXAM: Filed  Vitals:   09/15/13 2202  BP: 86/0  Pulse: 84  Temp:   Resp: 18    No intake or output data in the 24 hours ending 09/15/13 2238  General: Well appearing, NAD; daughter present  HEENT: normal  Neck: supple. JVP 10 with prominent CV waves; Carotids 2+ bilaterally; no bruits. No lymphadenopathy or thryomegaly appreciated.  Cor: LVAD hum  Lungs: CTA  Abdomen: soft, nontender, no distention. No hepatosplenomegaly. No bruits or masses. Good bowel sounds. Driveline site looks good. Anchor in place  Extremities: no cyanosis, clubbing, rash, no edema. PICC line site ok  Neuro: alert & orientedx3, cranial nerves grossly intact. Moves all 4 extremities w/o difficulty. Affect pleasant.   ECG: Sinus rhythm   Results for orders placed during the hospital encounter of 09/15/13 (from the past 24 hour(s))  CBC WITH DIFFERENTIAL     Status: Abnormal   Collection Time    09/15/13 10:04 PM      Result Value Ref Range   WBC 7.2  4.0 - 10.5 K/uL   RBC 3.52 (*) 3.87 - 5.11 MIL/uL   Hemoglobin 10.3 (*) 12.0 - 15.0 g/dL   HCT 16.1 (*) 09.6 - 04.5 %   MCV 87.8  78.0 - 100.0 fL   MCH 29.3  26.0 - 34.0 pg   MCHC 33.3  30.0 - 36.0 g/dL   RDW 40.9 (*) 81.1 - 91.4 %   Platelets 261  150 - 400 K/uL   Neutrophils Relative % 83 (*) 43 - 77 %   Neutro Abs 6.0  1.7 - 7.7 K/uL   Lymphocytes Relative 11 (*) 12 - 46 %   Lymphs Abs 0.8  0.7 - 4.0 K/uL   Monocytes Relative  5  3 - 12 %   Monocytes Absolute 0.4  0.1 - 1.0 K/uL   Eosinophils Relative 1  0 - 5 %   Eosinophils Absolute 0.1  0.0 - 0.7 K/uL   Basophils Relative 0  0 - 1 %   Basophils Absolute 0.0  0.0 - 0.1 K/uL  PROTIME-INR     Status: Abnormal   Collection Time    09/15/13 10:04 PM      Result Value Ref Range   Prothrombin Time 20.6 (*) 11.6 - 15.2 seconds   INR 1.83 (*) 0.00 - 1.49   No results found.   ASSESSMENT: 1. Palpitations 2. Chronic Systolic HF s/p HMII VAD placement 3. H/o VT with Biotronik ICD in pl;ace 4. CKD, stage  III  PLAN/DISCUSSION:  VAD interrogated personally she has multiple PI events (~20) over past two days and weight 5 pounds below baseline. Palpitations worse with standing. Suspect she is experiencing suction events. Will give 1L NS. Check electrolytes. Biotronik rep to interrogate device in ER. Likely can go home after hydration. Hold demadex unless weight 135 or greater. Will see in clinic this week for f/u.  Bevelyn BucklesDaniel R Bensimhon,MD 10:41 PM

## 2013-09-15 NOTE — ED Notes (Signed)
Iv team has been contacted the iv setup for this pt is not familiar to any of the rns here..  They will come

## 2013-09-15 NOTE — ED Notes (Signed)
Iv team here  nss hung and running.  Pt alert the pts daughter is at bedside .  She has the spare battery for the lvad

## 2013-09-15 NOTE — ED Notes (Signed)
Pt c/o heart palpitation since yesterday. Pt states palpations increase with activity and decrease without activity, pt reports shortness of breath with activity and decrease shortness of breat without activity. Pt has LVAD. Rapid response called

## 2013-09-16 ENCOUNTER — Other Ambulatory Visit: Payer: Self-pay

## 2013-09-16 ENCOUNTER — Encounter: Payer: Self-pay | Admitting: Anesthesiology

## 2013-09-16 LAB — T3: T3, Total: 71.2 ng/dl — ABNORMAL LOW (ref 80.0–204.0)

## 2013-09-16 LAB — T4: T4, Total: 11.3 ug/dL (ref 5.0–12.5)

## 2013-09-16 NOTE — ED Provider Notes (Signed)
I saw and evaluated the patient, reviewed the resident's note and I agree with the findings and plan.   EKG Interpretation None        Candyce Churn III, MD 09/16/13 [phone]

## 2013-09-17 ENCOUNTER — Ambulatory Visit (HOSPITAL_COMMUNITY): Payer: Self-pay | Admitting: Anesthesiology

## 2013-09-17 NOTE — Patient Instructions (Signed)
Take extra 2.5 mg today and then continue current dose. REcheck next week.

## 2013-09-18 ENCOUNTER — Encounter (HOSPITAL_COMMUNITY): Payer: Self-pay

## 2013-09-18 ENCOUNTER — Ambulatory Visit (HOSPITAL_COMMUNITY)
Admission: RE | Admit: 2013-09-18 | Discharge: 2013-09-18 | Disposition: A | Discharge status: Home / Self Care | Payer: Medicare HMO | Source: Ambulatory Visit | Attending: Internal Medicine | Admitting: Internal Medicine

## 2013-09-18 VITALS — BP 80/0 | HR 99 | Resp 18 | Wt 141.4 lb

## 2013-09-18 DIAGNOSIS — I129 Hypertensive chronic kidney disease with stage 1 through stage 4 chronic kidney disease, or unspecified chronic kidney disease: Secondary | ICD-10-CM | POA: Insufficient documentation

## 2013-09-18 DIAGNOSIS — N183 Chronic kidney disease, stage 3 unspecified: Secondary | ICD-10-CM | POA: Insufficient documentation

## 2013-09-18 DIAGNOSIS — I447 Left bundle-branch block, unspecified: Secondary | ICD-10-CM | POA: Insufficient documentation

## 2013-09-18 DIAGNOSIS — I50810 Right heart failure, unspecified: Secondary | ICD-10-CM

## 2013-09-18 DIAGNOSIS — I471 Supraventricular tachycardia, unspecified: Secondary | ICD-10-CM | POA: Insufficient documentation

## 2013-09-18 DIAGNOSIS — J4489 Other specified chronic obstructive pulmonary disease: Secondary | ICD-10-CM | POA: Insufficient documentation

## 2013-09-18 DIAGNOSIS — I509 Heart failure, unspecified: Secondary | ICD-10-CM | POA: Insufficient documentation

## 2013-09-18 DIAGNOSIS — E785 Hyperlipidemia, unspecified: Secondary | ICD-10-CM | POA: Diagnosis not present

## 2013-09-18 DIAGNOSIS — I5022 Chronic systolic (congestive) heart failure: Secondary | ICD-10-CM | POA: Insufficient documentation

## 2013-09-18 DIAGNOSIS — Z95810 Presence of automatic (implantable) cardiac defibrillator: Secondary | ICD-10-CM | POA: Insufficient documentation

## 2013-09-18 DIAGNOSIS — J449 Chronic obstructive pulmonary disease, unspecified: Secondary | ICD-10-CM | POA: Insufficient documentation

## 2013-09-18 DIAGNOSIS — Z95811 Presence of heart assist device: Secondary | ICD-10-CM | POA: Diagnosis not present

## 2013-09-18 DIAGNOSIS — I428 Other cardiomyopathies: Secondary | ICD-10-CM | POA: Insufficient documentation

## 2013-09-18 DIAGNOSIS — Z86718 Personal history of other venous thrombosis and embolism: Secondary | ICD-10-CM | POA: Diagnosis not present

## 2013-09-18 DIAGNOSIS — F329 Major depressive disorder, single episode, unspecified: Secondary | ICD-10-CM | POA: Insufficient documentation

## 2013-09-18 DIAGNOSIS — F3289 Other specified depressive episodes: Secondary | ICD-10-CM | POA: Diagnosis not present

## 2013-09-18 MED ORDER — AMIODARONE HCL 200 MG PO TABS: 200.0000 mg | ORAL_TABLET | Freq: Every day | ORAL | Status: DC

## 2013-09-18 NOTE — Progress Notes (Signed)
Symptom  Yes  No  Details   Angina        x Activity:   Claudication        x How far:   Syncope        x When:   Stroke        x   Orthopnea        x How many pillows:   1  PND        x How often:  CPAP      N/A How many hrs:   Pedal edema               x   Abd fullness               x    N&V        x   Diaphoresis        x When:  Bleeding       x   Urine color          yellow  SOB        x Activity:    Palpitations         x       When:  This morning  ICD shock        x   Hospitlizaitons        x When/where/why:  ED visit         x       When/where/why:  09/15/13 palpitations  Other MD              x When/who/why:     Activity    Household chores; no formal exercise  Fluid     @ 1 liter/day  Diet     Low sodium   Vital signs: HR:  99 MAP BP:  80 O2 Sat:  96 Wt:  141.6 lbs Last wt: 141.8 lbs Ht: 5'3"  LVAD interrogation reveals:  Speed: 9400 Flow:  4.4 Power:  5.4 PI: 5.3 Alarms:  none Events:  10 PI events today; 5 - 10 PI events daily Fixed speed: 9400 Low speed limit:  8800  LVAD exit site:  Well healed and incorporated. The velour is fully implanted at exit site. Dressing dry and intact. No erythema or drainage. Pt does not wear stabilization device due to skin irritation. Stressed importance of keeping drive line secure and free from trauma to prevent damage/infections. Pt verbalized understanding of same. Driveline dressing is being changed weekly per sterile technique using  Sorbaview dressing with biopatch on exit site. Pt denies fever or chills. Pt brought IV3000 (tegaderm) dressing to be substituted for SorbaView dressing due to itching but will wait until next clinic visit to try.  Pt/caregiver deny any alarms or VAD equipment issues. Pt is performing daily controller and system monitor self tests along with completing weekly and monthly maintenance for LVAD equipment.

## 2013-09-18 NOTE — Progress Notes (Signed)
Patient ID: Kaitlin [name], female   DOB: August 02, 1945, 68 y.o.   MRN: [id number]  PCP: Dr. Loleta Chance Pulmonologist: Dr. Kendrick Fries  Subjective: [name] is a 68 y/o woman with h/o severe HTN, [name], VT s/ Biotronik ICD,CKD (Baseline Cr 1.5-1.6) , severe biventricular HF due to NICM.   8/18 Underwent HM II LVAD implant (under destination criteria) along with TV repair (tricuspid annuloplasty). Post-op couse c/b RHF requiring take back to the OR for bleeding and temproray RVAD support. Subsequently started on dobutamine for RV support.  Recent PFTs with severe airflow limitation FEV1 < 1.0L  06/13/13 completed 200 feet in 2 minutes, but had to stop due to SOB  Follow up: Last visit increased norvasc to 10 mg daily which she tolerated. Presented to the ED on 09/15/17 with increased palpitations, fatigue and SOB. LVAD interrogated and multiple PI events. She was given 1L NS bolus and Biotronix device interrogated showing no events. Instructed to hold demadex unless weight greater than 135 lbs. Repeat PFTs showed continued pulmonary restriction. Felt pretty good until this am when she started noticing her heart jumping around again. Denies SOB, PND, orthopnea or CP. Weight at home 131 lbs.    Denies LVAD alarms.  Denies driveline trauma, erythema or drainage.  Denies ICD shocks. Reports taking Coumadin as prescribed.  Denies bright red blood per rectum or melena, no dark urine or hematuria.    ROS: All systems negative except as listed in HPI, PMH and Problem List.  Past Medical History  Diagnosis Date  . Paroxysmal supraventricular tachycardia   . Hypokalemia   . Chronic systolic heart failure     a. 08/4313 Echo: EF 15%, mod to sev antlat and inflat HK, inf AK, mild to mod MR, mildly/mod reduced RV fxn, Mod TR, PASP .  [name] History of DVT (deep vein thrombosis)   . Dyslipidemia   . HTN (hypertension)   . Nonischemic cardiomyopathy     a. 06/2012 Echo: EF 15%;  b. 06/2012 Cath: nl except  luminal irregs in LAD.  [name] Ventricular tachycardia     a. s/p ICD  . Goiter 2001  . Implantable cardiac defibrillator- Biotronik 2009    Single chamber  . [name] (left bundle branch block)     intermittent  . Palliative care patient     Current Outpatient Prescriptions  Medication Sig Dispense Refill  . acetaminophen (TYLENOL) 500 MG tablet Take 1,000 mg by mouth every 6 (six) hours as needed for mild pain.      [name] amiodarone (PACERONE) 100 MG tablet Take 1 tablet (100 mg total) by mouth daily.  30 tablet  2  . amLODipine (NORVASC) 10 MG tablet Take 1 tablet (10 mg total) by mouth daily.  30 tablet  6  . bisacodyl (DULCOLAX) 5 MG EC tablet Take 5 mg by mouth daily.      . citalopram (CELEXA) 40 MG tablet Take 1 tablet (40 mg total) by mouth daily.  90 tablet  3  . DOBUTamine (DOBUTREX) 4-5 MG/ML-% infusion Inject 420.6 mcg/min into the vein continuous.  250 mL  6  . docusate sodium (COLACE) 100 MG capsule Take 100 mg by mouth daily as needed for mild constipation.       . ferrous sulfate 324 (65 FE) MG TBEC Take 1 tablet by mouth daily.      . hydrochlorothiazide (HYDRODIURIL) 25 MG tablet Take 1 tablet (25 mg total) by mouth daily.  30 tablet  3  . Multiple Vitamins-Minerals (  MULTIVITAMIN WITH MINERALS) tablet Take 1 tablet by mouth daily.      . pantoprazole (PROTONIX) 40 MG tablet Take 1 tablet (40 mg total) by mouth daily.  90 tablet  3  . potassium chloride (K-DUR,KLOR-CON) 10 MEQ tablet Take 20 mEq by mouth.       . sildenafil (REVATIO) 20 MG tablet Take 1 tablet (20 mg total) by mouth 3 (three) times daily.  90 tablet  6  . simethicone (MYLICON) 80 MG chewable tablet Chew 80 mg by mouth 2 (two) times daily.      . simvastatin (ZOCOR) 10 MG tablet Take 1 tablet (10 mg total) by mouth at bedtime.  30 tablet  2  . spironolactone (ALDACTONE) 25 MG tablet Take 1 tablet (25 mg total) by mouth daily.  30 tablet  2  . torsemide (DEMADEX) 20 MG tablet Take 20 mg by mouth every other day.        . warfarin (COUMADIN) 5 MG tablet Take 2.5-5 mg by mouth. Take as directed for INR goal 2.0 - 3.0       No current facility-administered medications for this encounter.    Filed Vitals:   09/18/13 1256  BP: 80/0  Pulse: 99  Resp: 18  Weight: 141 lb 6 oz (64.127 kg)  SpO2: 96%    LVAD interrogation reveals:  Speed: 9400  Flow: 4.4 Power: 5.4 PI: 5.3 Alarms: none  Events: 10 PI events today; 5-10 events daily Fixed speed: 9400  Low speed limit: 8800    I reviewed the LVAD parameters from today, and compared the results to the patient's prior recorded data.  No programming changes were made.  The LVAD is functioning within specified parameters.  The patient performs LVAD self-test daily.  LVAD interrogation was negative for any significant power changes, alarms or PI events/speed drops.  LVAD equipment check completed and is in good working order.  Back-up equipment present.   LVAD education done on emergency procedures and precautions and reviewed exit site care.   PHYSICAL EXAM: General:  Well appearing, NAD HEENT: normal Neck: supple. JVP 9 with prominent CV waves; Carotids 2+ bilaterally; no bruits. No lymphadenopathy or thryomegaly appreciated. Cor: LVAD hum Lungs: CTA Abdomen: soft, nontender, no distention. No hepatosplenomegaly. No bruits or masses. Good bowel sounds. Driveline site looks good. Anchor in place Extremities: no cyanosis, clubbing, rash, tr edema. PICC line site ok  Neuro: alert & orientedx3, cranial nerves grossly intact. Moves all 4 extremities w/o difficulty. Affect pleasant.  ASSESSMENT & PLAN:  1. Chronic biventricular HF: s/p HM II LVAD. - NYHA II-III symptoms. Volume status stable may even be a little dry with increased PI events. Will continue to hold demadex unless weight at home 134 lbs or greater.  - No bb with RV failure and dobutamine - Will try to wean dobutamine to 3 mcg/min. Stressed the importance of calling if she notices andy increased  HF symptoms.  - Will continue spiro 25 mg daily. No ACE-I/ ARB currently with CKD. - Continue norvasc 10 mg daily, BP improved with increase.  - [facility] will continue to follow  - Reinforced the need and importance of daily weights, a low sodium diet, and fluid restriction (less than 2 L a day). Instructed to call the HF clinic if weight increases more than 3 lbs overnight or 5 lbs in a week.  2. RV failure -  - as above will try to wean dobutamine down to  3 mcg/min. If we are able to continue to wean dobutamine off could consider increasing sildenafil to 40 mg TID if needed. Will repeat RAMP ECHO once we are able to wean dobutmaine off or if we have trouble with weaning. 3. CKD stage III - baseline Cr 1.4-1.6. Continue weekly BMET 4. H/O VT s/p Biotronik ICD - Interrogated in the ED with no events. She is reporting some palpitations today and has increase in PI events. Will increase Amiodarone to 200 mg daily and continue to follow.  5. COPD  - She has restrictive lung disease after LVAD placement. Last CT showed small R pleural effusion slightly enlarged from previous, however last CXR last week showed pleural effusion gone. With improvement in breathing pulmonology repeated PFTs, however are unchanged. Dr. Gala Romney discussed results with her and asked her to increase exercising to see if this could help in anyway. Continue to work with IS.  6. LVAD - stable. All parameters within normal limits. As above increased PI events will continue to follow. 7. HTN - stable will increase in norvasc last visit.  8. Depression - Much improved and reports feeling happier. Will continue celexa 40 mg daily.    F/U 3 weeks Aundria Rud NP-C 1:08 PM  Patient seen and examined with Ulla Potash, NP. We discussed all aspects of the encounter. I agree with the assessment and plan as stated above. Overall doing well. Will continue to wean dobutamine slowly. We reviewed PFTs  and discussed the fact that these will likely prohibit transplant. Will refer to pulmonary rehab to see if exercise will help her lung function. Volume status looks ok.   VAD interrogated personally.   Total time spent 40 minutes. Over half that time spent discussing above.   Bevelyn Buckles Nekeya Briski,MD 3:19 PM

## 2013-09-18 NOTE — Patient Instructions (Signed)
1.  Increase amiodarone to 200 mg daily. May finish your 100 mg tabs (take two daily). Sent new Rx for 200 mg tabs - when you finish current pills, may start taking one 200 mg tab daily. 2.  Will decease Dobutamine to 3 mcg/kg/min. 3.  Continue to hold Demadex until home weight > 134 pounds.  4.  Home Health to draw labs next Tuesday. 5.  Return to VAD clinic 10/03/13 at 1 pm.

## 2013-09-19 NOTE — Progress Notes (Signed)
Quick Note:  Pt is aware of results and recs. Nothing further needed. ______

## 2013-09-25 ENCOUNTER — Ambulatory Visit (HOSPITAL_COMMUNITY): Payer: Self-pay | Admitting: *Deleted

## 2013-09-25 ENCOUNTER — Encounter: Payer: Self-pay | Admitting: Internal Medicine

## 2013-09-25 LAB — PROTIME-INR: INR: 3.1 — AB (ref <=1.1)

## 2013-09-27 ENCOUNTER — Encounter: Payer: Self-pay | Admitting: Internal Medicine

## 2013-09-28 ENCOUNTER — Other Ambulatory Visit (HOSPITAL_COMMUNITY): Payer: Self-pay | Admitting: *Deleted

## 2013-09-28 DIAGNOSIS — Z7901 Long term (current) use of anticoagulants: Secondary | ICD-10-CM

## 2013-09-28 DIAGNOSIS — Z95811 Presence of heart assist device: Secondary | ICD-10-CM

## 2013-10-03 ENCOUNTER — Telehealth: Payer: Self-pay | Admitting: *Deleted

## 2013-10-03 ENCOUNTER — Ambulatory Visit (HOSPITAL_COMMUNITY): Payer: Self-pay | Admitting: *Deleted

## 2013-10-03 ENCOUNTER — Ambulatory Visit: Payer: Self-pay | Admitting: *Deleted

## 2013-10-03 ENCOUNTER — Ambulatory Visit (HOSPITAL_COMMUNITY)
Admission: RE | Admit: 2013-10-03 | Discharge: 2013-10-03 | Disposition: A | Discharge status: Home / Self Care | Payer: Medicare HMO | Source: Ambulatory Visit | Attending: Internal Medicine | Admitting: Internal Medicine

## 2013-10-03 VITALS — BP 104/1 | HR 91 | Ht 63.0 in | Wt 136.6 lb

## 2013-10-03 DIAGNOSIS — I5022 Chronic systolic (congestive) heart failure: Secondary | ICD-10-CM | POA: Insufficient documentation

## 2013-10-03 DIAGNOSIS — I472 Ventricular tachycardia, unspecified: Secondary | ICD-10-CM

## 2013-10-03 DIAGNOSIS — I4729 Other ventricular tachycardia: Secondary | ICD-10-CM

## 2013-10-03 DIAGNOSIS — F3289 Other specified depressive episodes: Secondary | ICD-10-CM

## 2013-10-03 DIAGNOSIS — I509 Heart failure, unspecified: Secondary | ICD-10-CM | POA: Insufficient documentation

## 2013-10-03 DIAGNOSIS — Z7901 Long term (current) use of anticoagulants: Secondary | ICD-10-CM | POA: Insufficient documentation

## 2013-10-03 DIAGNOSIS — Z95811 Presence of heart assist device: Secondary | ICD-10-CM

## 2013-10-03 DIAGNOSIS — F32A Depression, unspecified: Secondary | ICD-10-CM

## 2013-10-03 DIAGNOSIS — I50810 Right heart failure, unspecified: Secondary | ICD-10-CM

## 2013-10-03 DIAGNOSIS — J449 Chronic obstructive pulmonary disease, unspecified: Secondary | ICD-10-CM | POA: Insufficient documentation

## 2013-10-03 DIAGNOSIS — F329 Major depressive disorder, single episode, unspecified: Secondary | ICD-10-CM | POA: Insufficient documentation

## 2013-10-03 DIAGNOSIS — E785 Hyperlipidemia, unspecified: Secondary | ICD-10-CM | POA: Insufficient documentation

## 2013-10-03 DIAGNOSIS — J4489 Other specified chronic obstructive pulmonary disease: Secondary | ICD-10-CM | POA: Insufficient documentation

## 2013-10-03 DIAGNOSIS — I4891 Unspecified atrial fibrillation: Secondary | ICD-10-CM

## 2013-10-03 DIAGNOSIS — I129 Hypertensive chronic kidney disease with stage 1 through stage 4 chronic kidney disease, or unspecified chronic kidney disease: Secondary | ICD-10-CM | POA: Insufficient documentation

## 2013-10-03 DIAGNOSIS — N183 Chronic kidney disease, stage 3 unspecified: Secondary | ICD-10-CM | POA: Insufficient documentation

## 2013-10-03 DIAGNOSIS — Z79899 Other long term (current) drug therapy: Secondary | ICD-10-CM | POA: Insufficient documentation

## 2013-10-03 LAB — BASIC METABOLIC PANEL
BUN: 24 mg/dL — ABNORMAL HIGH (ref 6–23)
CO2: 20 meq/L (ref 19–32)
Calcium: 10.2 mg/dL (ref 8.4–10.5)
Chloride: 97 mEq/L (ref 96–112)
Creatinine, Ser: 1.27 mg/dL — ABNORMAL HIGH (ref 0.50–1.10)
GFR calc Af Amer: 50 mL/min — ABNORMAL LOW (ref >90)
GFR calc non Af Amer: 43 mL/min — ABNORMAL LOW (ref >90)
GLUCOSE: 204 mg/dL — AB (ref 70–99)
Potassium: 3.6 mEq/L — ABNORMAL LOW (ref 3.7–5.3)
Sodium: 134 mEq/L — ABNORMAL LOW (ref 137–147)

## 2013-10-03 LAB — CBC
HCT: 31.9 % — ABNORMAL LOW (ref 36.0–46.0)
HEMOGLOBIN: 10.6 g/dL — AB (ref 12.0–15.0)
MCH: 29.2 pg (ref 26.0–34.0)
MCHC: 33.2 g/dL (ref 30.0–36.0)
MCV: 87.9 fL (ref 78.0–100.0)
Platelets: 318 10*3/uL (ref 150–400)
RBC: 3.63 MIL/uL — ABNORMAL LOW (ref 3.87–5.11)
RDW: 15.7 % — ABNORMAL HIGH (ref 11.5–15.5)
WBC: 8.3 10*3/uL (ref 4.0–10.5)

## 2013-10-03 LAB — PRO B NATRIURETIC PEPTIDE: Pro B Natriuretic peptide (BNP): 1462 pg/mL — ABNORMAL HIGH (ref 0–125)

## 2013-10-03 LAB — PROTIME-INR
INR: 2.38 — AB (ref 0.00–1.49)
Prothrombin Time: 25.2 seconds — ABNORMAL HIGH (ref 11.6–15.2)

## 2013-10-03 LAB — LACTATE DEHYDROGENASE: LDH: 258 U/L — ABNORMAL HIGH (ref 94–250)

## 2013-10-03 NOTE — Patient Instructions (Addendum)
1.  Take extra  K-Dur 20 meq today. 2.  Return to VAD clinic in 2 weeks. 3.  Will wean Dobutamine to 2 mcgs and re-check labs next week per home health. 4.  No change in coumadin dose - will re-check INR next week with routine HH labs

## 2013-10-03 NOTE — Telephone Encounter (Signed)
Pt received shock approx 1 hour ago. Pt states she was laying in bed when it occurred.  Pt currently asymptomatic after HV therapy. Pt states she is taking her amiodarone and potassium as instructed. Dr. Ladona Ridgel reviewed episode, Dr. Ladona Ridgel aware pt will be seen in HF clinic on 10/03/13.  Pt and Dr. Ladona Ridgel agree to watchfully wait at this point.

## 2013-10-03 NOTE — Progress Notes (Signed)
Symptom  Yes  No  Details   Angina        x Activity:   Claudication        x How far:   Syncope        x When:   Stroke        x   Orthopnea        x How many pillows:   1  PND        x How often:  CPAP      N/A How many hrs:   Pedal edema               x   Abd fullness               x    N&V        x   Diaphoresis        x When:  Bleeding       x   Urine color          yellow  SOB        x Activity:    Palpitations              x When:    ICD shock         x        10/02/13 at 3:49 pm; 3 attempted ATP with one 30J shock appropriate VT rate 194  Hospitlizaitons        x When/where/why:  ED visit               x  When/where/why:    Other MD              x When/who/why:     Activity    Household chores; walking daily  Fluid     @ 1 liter/day  Diet     Low sodium   Vital signs: HR:  91 MAP BP:  94 O2 Sat:  97 Wt:  136.6 lbs Last wt: 141.6 lbs Ht: 5'3"  LVAD interrogation reveals:  Speed: 9400 Flow:  4.4 Power:  5.3 PI: 5.0 Alarms:  none Events:  0 - 10 PI events Fixed speed: 9400 Low speed limit:  8800  LVAD exit site:  Well healed and incorporated. The velour is fully implanted at exit site. Dressing dry and intact. No erythema or drainage. Pt does not wear stabilization device due to skin irritation. Stressed importance of keeping drive line secure and free from trauma to prevent damage/infections. Pt verbalized understanding of same. Driveline dressing is being changed weekly per sterile technique using. Pt using IV3000 tegaderm dressing from PICC line kit with less skin irritation/itching. Pt denies fever or chills.   Pt/caregiver deny any alarms or VAD equipment issues. Pt is completing weekly and monthly maintenance for LVAD equipment.  Pt is performing daily controller and system monitor self tests along with completing weekly and monthly maintenance for LVAD equipment. LVAD equipment check completed and is in good working order. Back-up equipment present. LVAD  education done on emergency procedures and precautions and reviewed exit site care.   Biotronic rep here to interrogate ICD. VT zone re-programmed to 222 bpm (from 194) and VF zone re-programmed to 240 bpm (from 222) per Dr. Clayborne Dana instructions.

## 2013-10-03 NOTE — Progress Notes (Signed)
Patient ID: MEILING TOOKS, female   DOB: [birth year], 68 y.o.   MRN: [id number]  PCP: Dr. Loleta Chance Pulmonologist: Dr. Kendrick Fries  Subjective: Ms. Coffey is a 68 y/o woman with h/o severe HTN, [name], VT s/ Biotronik ICD,CKD (Baseline Cr 1.5-1.6) , severe biventricular HF due to NICM.   8/18 Underwent HM II LVAD implant (under destination criteria) along with TV repair (tricuspid annuloplasty). Post-op couse c/b RHF requiring take back to the OR for bleeding and temproray RVAD support. Subsequently started on dobutamine for RV support.  Recent PFTs with severe airflow limitation FEV1 < 1.0L  06/13/13 completed 200 feet in 2 minutes, but had to stop due to SOB  Follow up for Heart Failure: Last visit decreased Dobutamine to 3 mcg and increased amiodarone to 200 mg daily d/t palpitations. Had ICD shock yesterday for VT. 3 attempted ATPs accelerated the ventricular rate to 197 and reached Vt zone and shocked her. Feels good. Last week felt some SOB and took an extra torsemide and sorbitol to clear her out and felt much better. Denies SOB, PND, orthopnea or CP. Walked all the way from garage to clinic with no issues. Walking in her neighborhood now and improving on distance. Weight at home 129-131 lbs. + palpitations. Taking torsemide PRN.   Denies LVAD alarms.  Denies driveline trauma, erythema or drainage.  + ICD shock. Reports taking Coumadin as prescribed.  Denies bright red blood per rectum or melena, no dark urine or hematuria.    ROS: All systems negative except as listed in HPI, PMH and Problem List.  Past Medical History  Diagnosis Date  . Paroxysmal supraventricular tachycardia   . Hypokalemia   . Chronic systolic heart failure     a. 12/5460 Echo: EF 15%, mod to sev antlat and inflat HK, inf AK, mild to mod MR, mildly/mod reduced RV fxn, Mod TR, PASP .  [name] History of DVT (deep vein thrombosis)   . Dyslipidemia   . HTN (hypertension)   . Nonischemic cardiomyopathy     a. 06/2012  Echo: EF 15%;  b. 06/2012 Cath: nl except luminal irregs in LAD.  [name] Ventricular tachycardia     a. s/p ICD  . Goiter 2001  . Implantable cardiac defibrillator- Biotronik 2009    Single chamber  . [name] (left bundle branch block)     intermittent  . Palliative care patient     Current Outpatient Prescriptions  Medication Sig Dispense Refill  . acetaminophen (TYLENOL) 500 MG tablet Take 1,000 mg by mouth every 6 (six) hours as needed for mild pain.      [name] amiodarone (PACERONE) 200 MG tablet Take 1 tablet (200 mg total) by mouth daily.  30 tablet  3  . amLODipine (NORVASC) 10 MG tablet Take 1 tablet (10 mg total) by mouth daily.  30 tablet  6  . bisacodyl (DULCOLAX) 5 MG EC tablet Take 5 mg by mouth daily.      . citalopram (CELEXA) 40 MG tablet Take 1 tablet (40 mg total) by mouth daily.  90 tablet  3  . DOBUTamine (DOBUTREX) 4-5 MG/ML-% infusion Inject 420.6 mcg/min into the vein continuous.  250 mL  6  . docusate sodium (COLACE) 100 MG capsule Take 100 mg by mouth daily as needed for mild constipation.       . ferrous sulfate 324 (65 FE) MG TBEC Take 1 tablet by mouth daily.      . hydrochlorothiazide (HYDRODIURIL) 25 MG tablet Take 1 tablet (25  mg total) by mouth daily.  30 tablet  3  . Multiple Vitamins-Minerals (MULTIVITAMIN WITH MINERALS) tablet Take 1 tablet by mouth daily.      . pantoprazole (PROTONIX) 40 MG tablet Take 1 tablet (40 mg total) by mouth daily.  90 tablet  3  . potassium chloride (K-DUR,KLOR-CON) 10 MEQ tablet Take 20 mEq by mouth.       . sildenafil (REVATIO) 20 MG tablet Take 1 tablet (20 mg total) by mouth 3 (three) times daily.  90 tablet  6  . simethicone (MYLICON) 80 MG chewable tablet Chew 80 mg by mouth 2 (two) times daily.      . simvastatin (ZOCOR) 10 MG tablet Take 1 tablet (10 mg total) by mouth at bedtime.  30 tablet  2  . spironolactone (ALDACTONE) 25 MG tablet Take 1 tablet (25 mg total) by mouth daily.  30 tablet  2  . torsemide (DEMADEX) 20 MG tablet  Take 20 mg by mouth every other day.       . warfarin (COUMADIN) 5 MG tablet Take 2.5-5 mg by mouth. Take as directed for INR goal 2.0 - 3.0       No current facility-administered medications for this encounter.   Filed Vitals:   10/03/13 1318  BP: 104/1  Pulse: 91  Height: 5\' 3"  (1.6 m)  Weight: 136 lb 9.6 oz (61.961 kg)  SpO2: 97%  Repeat MAP 94  LVAD interrogation reveals:  Speed: 9400  Flow: 4.2 Power: 5.2 PI: 5.0 Alarms: none  Events: 0-10 PI events a day Fixed speed: 9400  Low speed limit: 8800   I reviewed the LVAD parameters from today, and compared the results to the patient's prior recorded data.  No programming changes were made.  The LVAD is functioning within specified parameters.  The patient performs LVAD self-test daily.  LVAD interrogation was negative for any significant power changes, alarms or PI events/speed drops.  LVAD equipment check completed and is in good working order.  Back-up equipment present.   LVAD education done on emergency procedures and precautions and reviewed exit site care.   PHYSICAL EXAM: General:  Well appearing, NAD HEENT: normal Neck: supple. JVP 9 with prominent CV waves; Carotids 2+ bilaterally; no bruits. No lymphadenopathy or thryomegaly appreciated. Cor: LVAD hum Lungs: CTA Abdomen: soft, nontender, no distention. No hepatosplenomegaly. No bruits or masses. Good bowel sounds. Driveline site looks good. Anchor in place Extremities: no cyanosis, clubbing, rash, tr edema. PICC line site ok  Neuro: alert & orientedx3, cranial nerves grossly intact. Moves all 4 extremities w/o difficulty. Affect pleasant.  EKG: NSR with RBBB   ASSESSMENT & PLAN:  1. Chronic biventricular HF: s/p HM II LVAD. - NYHA II-III symptoms.Volume status stable. Will continue torsemide 20 mg QOD and HCTZ 25 mg daily.  - No bb with RV failure and dobutamine - Will wean dobutamine to 2 mcg/min. Stressed the importance of calling if she notices andy increased  HF symptoms.  - Will continue spiro 25 mg daily. No ACE-I/ ARB currently with CKD.  - Continue norvasc 10 mg daily. MAP slightly elevated. - Reinforced the need and importance of daily weights, a low sodium diet, and fluid restriction (less than 2 L a day). Instructed to call the HF clinic if weight increases more than 3 lbs overnight or 5 lbs in a week.  2. RV failure -  - as above will try to wean dobutamine down to 2 mcg/min. If she continues to feel well will  bring back in 2 weeks with goal to continue to wean or just turn off. Will eventually need RAMP ECHO to assess speed of pump, but also RV. Continue sildenafil 20 mg TID, but if need to with wean of dobutamine off could increase to 40 mg TID.  3. CKD stage III - baseline Cr 1.4-1.6. Creatinine today 1.27. 4. H/O VT s/p Biotronik ICD - Biotronik rep interrogated device. 10/02/13 shocked after 3 attempted ATP with one 30J shock appropriate VT rate 194. She was in afib before, but now is in NSR. Will continue amiodarone 200 mg daily and will raise VT threshold. She has appt with EP on Monday.  5. COPD  - She has restrictive lung disease after LVAD placement. Continue to work with IS and would like to try to get patient involved with pulmonary rehab if we can get her dobutamine weaned off.  6. LVAD - stable. All parameters within normal limits. 0-10 PI events a day. - CBC, BMET, INR, Mag, LDH and pro-BNP stable. K+ a little low will supplement with extra 20 meq today. 7. HTN - Slightly elevated. Repeat MAP 94. Will continue to follow. 8. Depression - Doing better. Continue Celexa 40 mg daily.  F/U 2 weeks.  Aundria RudAli B Jahron Hunsinger NP-C 2:11 PM

## 2013-10-04 ENCOUNTER — Other Ambulatory Visit (HOSPITAL_COMMUNITY): Payer: Self-pay | Admitting: *Deleted

## 2013-10-04 DIAGNOSIS — I472 Ventricular tachycardia, unspecified: Secondary | ICD-10-CM

## 2013-10-04 DIAGNOSIS — Z95811 Presence of heart assist device: Secondary | ICD-10-CM

## 2013-10-04 LAB — MAGNESIUM: Magnesium: 2.1 mg/dL (ref 1.5–2.5)

## 2013-10-09 ENCOUNTER — Telehealth (HOSPITAL_COMMUNITY): Payer: Self-pay | Admitting: *Deleted

## 2013-10-09 ENCOUNTER — Ambulatory Visit (INDEPENDENT_AMBULATORY_CARE_PROVIDER_SITE_OTHER): Payer: Commercial Managed Care - HMO | Admitting: Internal Medicine

## 2013-10-09 ENCOUNTER — Encounter: Payer: Self-pay | Admitting: Internal Medicine

## 2013-10-09 VITALS — HR 92 | Ht 63.0 in | Wt 132.0 lb

## 2013-10-09 DIAGNOSIS — I472 Ventricular tachycardia, unspecified: Secondary | ICD-10-CM

## 2013-10-09 DIAGNOSIS — I4729 Other ventricular tachycardia: Secondary | ICD-10-CM

## 2013-10-09 DIAGNOSIS — I428 Other cardiomyopathies: Secondary | ICD-10-CM

## 2013-10-09 DIAGNOSIS — I5022 Chronic systolic (congestive) heart failure: Secondary | ICD-10-CM

## 2013-10-09 DIAGNOSIS — Z95810 Presence of automatic (implantable) cardiac defibrillator: Secondary | ICD-10-CM

## 2013-10-09 LAB — MDC_IDC_ENUM_SESS_TYPE_INCLINIC
Battery Voltage: 3.09 V
Brady Statistic RV Percent Paced: 20 %
HighPow Impedance: 34 Ohm
Implantable Pulse Generator Serial Number: 60485337
Lead Channel Pacing Threshold Pulse Width: 0.4 ms
Lead Channel Sensing Intrinsic Amplitude: 9.4 mV
Lead Channel Setting Pacing Amplitude: 2.4 V
Lead Channel Setting Pacing Pulse Width: 0.4 ms
Lead Channel Setting Sensing Sensitivity: 0.8 mV
MDC IDC MSMT LEADCHNL RV IMPEDANCE VALUE: 565 Ohm
MDC IDC MSMT LEADCHNL RV PACING THRESHOLD AMPLITUDE: 0.6 V
MDC IDC SESS DTM: 20150609171423
MDC IDC SET ZONE DETECTION INTERVAL: 270 ms
MDC IDC SET ZONE DETECTION INTERVAL: 360 ms
Zone Setting Detection Interval: 250 ms

## 2013-10-09 NOTE — Telephone Encounter (Signed)
Daughter called VAD coordinator to report her mother is experiencing shortness of breath. She wants to call 911, but mother doesn't think it is necessary. Daughter is driving to patient's home now. No LVAD alarms.   Called home health nurse Clayborne Dana for update; Clayborne Dana saw pt last Thursday and was doing well. She reports pt weighed 131 lbs last Thursday and is taking Demadex prn weight gain or SOB.  Called patient and she reports she is "feeling fine" other than SOB which started yesterday and has increased today. She walked to mailbox and noted SOB which normally she doesn't have. Pt denies weight gain, pedal edema, increased abdominal distention, CP, palpitations, ICD shocks, or PND.  Her weight remains steady at 131 lbs, her last Torsemide was 20 mg Saturday 10/06/13.  She has appointment today with Dr. Ladona Ridgel for device check s/p ICD discharge last week.   Ulla Potash, NP updated - instructed pt to take Torsemide 20 mg today and 20 mg tomorrow am and to keep her appointment with Dr. Ladona Ridgel this afternoon. Ali called Dr. Lubertha Basque office to report pt symptoms and ask to check fluid status during device check. Advised pt to call VAD pager if symptoms do not improve or worsen; pt verbalized understanding of same.

## 2013-10-10 ENCOUNTER — Encounter: Payer: Self-pay | Admitting: Internal Medicine

## 2013-10-10 ENCOUNTER — Emergency Department (HOSPITAL_COMMUNITY): Payer: Medicare HMO

## 2013-10-10 ENCOUNTER — Encounter (HOSPITAL_COMMUNITY): Payer: Self-pay | Admitting: Emergency Medicine

## 2013-10-10 ENCOUNTER — Inpatient Hospital Stay (HOSPITAL_COMMUNITY)
Admission: EM | Admit: 2013-10-10 | Discharge: 2013-10-14 | DRG: 309 | Disposition: A | Discharge status: Home Health | Payer: Medicare HMO | Attending: Internal Medicine | Admitting: Internal Medicine

## 2013-10-10 DIAGNOSIS — I472 Ventricular tachycardia, unspecified: Principal | ICD-10-CM | POA: Diagnosis present

## 2013-10-10 DIAGNOSIS — N183 Chronic kidney disease, stage 3 unspecified: Secondary | ICD-10-CM | POA: Diagnosis present

## 2013-10-10 DIAGNOSIS — I5023 Acute on chronic systolic (congestive) heart failure: Secondary | ICD-10-CM | POA: Diagnosis not present

## 2013-10-10 DIAGNOSIS — R570 Cardiogenic shock: Secondary | ICD-10-CM

## 2013-10-10 DIAGNOSIS — I509 Heart failure, unspecified: Secondary | ICD-10-CM | POA: Diagnosis not present

## 2013-10-10 DIAGNOSIS — I4729 Other ventricular tachycardia: Secondary | ICD-10-CM | POA: Diagnosis not present

## 2013-10-10 DIAGNOSIS — J984 Other disorders of lung: Secondary | ICD-10-CM | POA: Diagnosis present

## 2013-10-10 DIAGNOSIS — E876 Hypokalemia: Secondary | ICD-10-CM | POA: Diagnosis present

## 2013-10-10 DIAGNOSIS — I5022 Chronic systolic (congestive) heart failure: Secondary | ICD-10-CM | POA: Diagnosis not present

## 2013-10-10 DIAGNOSIS — E785 Hyperlipidemia, unspecified: Secondary | ICD-10-CM | POA: Diagnosis present

## 2013-10-10 DIAGNOSIS — Z79899 Other long term (current) drug therapy: Secondary | ICD-10-CM

## 2013-10-10 DIAGNOSIS — I50810 Right heart failure, unspecified: Secondary | ICD-10-CM

## 2013-10-10 DIAGNOSIS — I428 Other cardiomyopathies: Secondary | ICD-10-CM | POA: Diagnosis present

## 2013-10-10 DIAGNOSIS — N179 Acute kidney failure, unspecified: Secondary | ICD-10-CM

## 2013-10-10 DIAGNOSIS — Z7901 Long term (current) use of anticoagulants: Secondary | ICD-10-CM

## 2013-10-10 DIAGNOSIS — Z86718 Personal history of other venous thrombosis and embolism: Secondary | ICD-10-CM

## 2013-10-10 DIAGNOSIS — N189 Chronic kidney disease, unspecified: Secondary | ICD-10-CM

## 2013-10-10 DIAGNOSIS — I129 Hypertensive chronic kidney disease with stage 1 through stage 4 chronic kidney disease, or unspecified chronic kidney disease: Secondary | ICD-10-CM | POA: Diagnosis present

## 2013-10-10 DIAGNOSIS — Z95811 Presence of heart assist device: Secondary | ICD-10-CM

## 2013-10-10 DIAGNOSIS — E049 Nontoxic goiter, unspecified: Secondary | ICD-10-CM | POA: Diagnosis present

## 2013-10-10 LAB — COMPREHENSIVE METABOLIC PANEL
ALK PHOS: 207 U/L — AB (ref 39–117)
ALT: 25 U/L (ref 0–35)
AST: 27 U/L (ref 0–37)
Albumin: 4.5 g/dL (ref 3.5–5.2)
BILIRUBIN TOTAL: 2.4 mg/dL — AB (ref 0.3–1.2)
BUN: 25 mg/dL — AB (ref 6–23)
CHLORIDE: 90 meq/L — AB (ref 96–112)
CO2: 25 meq/L (ref 19–32)
Calcium: 10.4 mg/dL (ref 8.4–10.5)
Creatinine, Ser: 1.66 mg/dL — ABNORMAL HIGH (ref 0.50–1.10)
GFR calc Af Amer: 36 mL/min — ABNORMAL LOW (ref >90)
GFR, EST NON AFRICAN AMERICAN: 31 mL/min — AB (ref >90)
Glucose, Bld: 98 mg/dL (ref 70–99)
POTASSIUM: 2.7 meq/L — AB (ref 3.7–5.3)
Sodium: 136 mEq/L — ABNORMAL LOW (ref 137–147)
Total Protein: 8.2 g/dL (ref 6.0–8.3)

## 2013-10-10 LAB — CBC
HEMATOCRIT: 32.5 % — AB (ref 36.0–46.0)
HEMOGLOBIN: 10.5 g/dL — AB (ref 12.0–15.0)
MCH: 28.3 pg (ref 26.0–34.0)
MCHC: 32.3 g/dL (ref 30.0–36.0)
MCV: 87.6 fL (ref 78.0–100.0)
Platelets: 334 10*3/uL (ref 150–400)
RBC: 3.71 MIL/uL — AB (ref 3.87–5.11)
RDW: 16.1 % — AB (ref 11.5–15.5)
WBC: 12.7 10*3/uL — AB (ref 4.0–10.5)

## 2013-10-10 LAB — PROTIME-INR
INR: 2.27 — AB (ref 0.00–1.49)
Prothrombin Time: 24.3 seconds — ABNORMAL HIGH (ref 11.6–15.2)

## 2013-10-10 LAB — MAGNESIUM: Magnesium: 1.8 mg/dL (ref 1.5–2.5)

## 2013-10-10 LAB — TROPONIN I: Troponin I: <0.3 ng/mL (ref <0.30)

## 2013-10-10 LAB — PRO B NATRIURETIC PEPTIDE: PRO B NATRI PEPTIDE: 1759 pg/mL — AB (ref 0–125)

## 2013-10-10 LAB — LACTATE DEHYDROGENASE: LDH: 287 U/L — ABNORMAL HIGH (ref 94–250)

## 2013-10-10 MED ORDER — PANTOPRAZOLE SODIUM 40 MG PO TBEC
40.0000 mg | DELAYED_RELEASE_TABLET | Freq: Every day | ORAL | Status: DC
  Administered 2013-10-11 – 2013-10-14 (×5): 40 mg via ORAL
  Filled 2013-10-10 (×2): qty 1

## 2013-10-10 MED ORDER — SPIRONOLACTONE 25 MG PO TABS
25.0000 mg | ORAL_TABLET | Freq: Every day | ORAL | Status: DC
  Administered 2013-10-11 – 2013-10-13 (×3): 25 mg via ORAL
  Filled 2013-10-10 (×4): qty 1

## 2013-10-10 MED ORDER — MULTI-VITAMIN/MINERALS PO TABS: 1.0000 | ORAL_TABLET | Freq: Every day | ORAL | Status: DC

## 2013-10-10 MED ORDER — ACETAMINOPHEN 500 MG PO TABS: 1000.0000 mg | ORAL_TABLET | Freq: Four times a day (QID) | ORAL | Status: DC | PRN

## 2013-10-10 MED ORDER — WARFARIN - PHARMACIST DOSING INPATIENT
Freq: Every day | Status: DC
  Administered 2013-10-11: 18:00:00

## 2013-10-10 MED ORDER — SIMETHICONE 80 MG PO CHEW: 80.0000 mg | CHEWABLE_TABLET | Freq: Two times a day (BID) | ORAL | Status: DC

## 2013-10-10 MED ORDER — POTASSIUM CHLORIDE 10 MEQ/50ML IV SOLN
10.0000 meq | INTRAVENOUS | Status: AC
  Administered 2013-10-11 (×4): 10 meq via INTRAVENOUS
  Filled 2013-10-10 (×5): qty 50

## 2013-10-10 MED ORDER — WARFARIN SODIUM 5 MG PO TABS
5.0000 mg | ORAL_TABLET | Freq: Once | ORAL | Status: AC
  Administered 2013-10-11: 5 mg via ORAL
  Filled 2013-10-10: qty 1

## 2013-10-10 MED ORDER — SIMVASTATIN 10 MG PO TABS
10.0000 mg | ORAL_TABLET | Freq: Every day | ORAL | Status: DC
  Administered 2013-10-11 – 2013-10-13 (×4): 10 mg via ORAL
  Filled 2013-10-10 (×5): qty 1

## 2013-10-10 MED ORDER — FERROUS SULFATE 325 (65 FE) MG PO TABS
325.0000 mg | ORAL_TABLET | Freq: Every day | ORAL | Status: DC
  Administered 2013-10-11 – 2013-10-14 (×4): 325 mg via ORAL
  Filled 2013-10-10 (×4): qty 1

## 2013-10-10 MED ORDER — DOCUSATE SODIUM 100 MG PO CAPS: 100.0000 mg | ORAL_CAPSULE | Freq: Every day | ORAL | Status: DC | PRN

## 2013-10-10 MED ORDER — POTASSIUM CHLORIDE CRYS ER 20 MEQ PO TBCR
40.0000 meq | EXTENDED_RELEASE_TABLET | Freq: Once | ORAL | Status: AC
  Administered 2013-10-10: 40 meq via ORAL
  Filled 2013-10-10: qty 2

## 2013-10-10 MED ORDER — BISACODYL 5 MG PO TBEC: 5.0000 mg | DELAYED_RELEASE_TABLET | Freq: Every day | ORAL | Status: DC | PRN

## 2013-10-10 MED ORDER — CITALOPRAM HYDROBROMIDE 40 MG PO TABS
40.0000 mg | ORAL_TABLET | Freq: Every day | ORAL | Status: DC
  Administered 2013-10-11: 40 mg via ORAL
  Filled 2013-10-10 (×2): qty 1

## 2013-10-10 MED ORDER — SILDENAFIL CITRATE 20 MG PO TABS
20.0000 mg | ORAL_TABLET | Freq: Three times a day (TID) | ORAL | Status: DC
  Administered 2013-10-11 – 2013-10-14 (×10): 20 mg via ORAL
  Filled 2013-10-10 (×13): qty 1

## 2013-10-10 MED ORDER — AMIODARONE HCL 200 MG PO TABS
400.0000 mg | ORAL_TABLET | Freq: Two times a day (BID) | ORAL | Status: DC
  Administered 2013-10-11 – 2013-10-14 (×8): 400 mg via ORAL
  Filled 2013-10-10 (×9): qty 2

## 2013-10-10 MED ORDER — DOBUTAMINE IN D5W 4-5 MG/ML-% IV SOLN
2.0000 ug/kg/min | INTRAVENOUS | Status: DC
  Administered 2013-10-10: 2 ug/kg/min via INTRAVENOUS
  Filled 2013-10-10 (×2): qty 250

## 2013-10-10 MED ORDER — ALPRAZOLAM 0.5 MG PO TABS
0.5000 mg | ORAL_TABLET | Freq: Every evening | ORAL | Status: DC | PRN
  Administered 2013-10-11 – 2013-10-13 (×4): 0.5 mg via ORAL
  Filled 2013-10-10 (×4): qty 1

## 2013-10-10 MED ORDER — AMLODIPINE BESYLATE 10 MG PO TABS
10.0000 mg | ORAL_TABLET | Freq: Every day | ORAL | Status: DC
  Administered 2013-10-11 – 2013-10-14 (×4): 10 mg via ORAL
  Filled 2013-10-10 (×4): qty 1

## 2013-10-10 MED ORDER — POTASSIUM CHLORIDE CRYS ER 20 MEQ PO TBCR
40.0000 meq | EXTENDED_RELEASE_TABLET | Freq: Once | ORAL | Status: AC
  Administered 2013-10-11: 40 meq via ORAL
  Filled 2013-10-10: qty 2

## 2013-10-10 MED ORDER — DOBUTAMINE IN D5W 4-5 MG/ML-% IV SOLN
4.0000 ug/kg/min | INTRAVENOUS | Status: DC
  Administered 2013-10-10: 2 ug/kg/min via INTRAVENOUS

## 2013-10-10 MED ORDER — MAGNESIUM SULFATE 40 MG/ML IJ SOLN
2.0000 g | Freq: Once | INTRAMUSCULAR | Status: AC
  Administered 2013-10-10: 2 g via INTRAVENOUS
  Filled 2013-10-10: qty 50

## 2013-10-10 NOTE — ED Notes (Signed)
The pt is waiting for the iv team before i startt the mag sulfate drip

## 2013-10-10 NOTE — ED Notes (Signed)
The patient came from home, the patient has a defibrillator and was socked.  She called her PCP and they advised her to come to the ED.  I was told by Melford Aase, RN that upon her arrival we should call Dr. Adriana Reams and Rapid Response.  I called Danielle, Diplomatic Services operational officer and advised her to contact Dr. Adriana Reams and RR nurse and she said she would.  Ian Malkin, RN, AD in charge was notified of her arrival.

## 2013-10-10 NOTE — Progress Notes (Signed)
[facility]  Patient Status: Active pt with [facility] up to this admission    [facility] is providing the following services: HHRN and home infusion pharmacy team for home Dobutamine.  [facility] hospital team will follow pt while inpatient and support transition home when deemed appropriate by Dr. Gala Romney and the HF Team.  If patient discharges after hours, please call [phone].   Kaitlin [name] 10/10/2013, 11:05 PM

## 2013-10-10 NOTE — ED Notes (Signed)
Patient here with complaint of Defibrilator firing once around 1730. Patient has LVAD.

## 2013-10-10 NOTE — Assessment & Plan Note (Signed)
She will continue her current dose of amio. I am not inclined at this point to attempt to increase the dose.

## 2013-10-10 NOTE — ED Provider Notes (Signed)
CSN: [id number]     Arrival date & time 10/10/13  1946 History   First MD Initiated Contact with Patient 10/10/13 1958     Chief Complaint  Patient presents with  . AICD Problem     (Consider location/radiation/quality/duration/timing/severity/associated sxs/prior Treatment) HPI Patient presents with an episode of syncope, defibrillator firing. Patient currently has no complaints. She needs that earlier today she had one episode of loss of consciousness, awoke on the floor, after feeling the firing of her defibrillator. She states that she is doing generally well prior to the syncope event. No clear precipitant, Past Medical History  Diagnosis Date  . Paroxysmal supraventricular tachycardia   . Hypokalemia   . Chronic systolic heart failure     a. 12/9209 Echo: EF 15%, mod to sev antlat and inflat HK, inf AK, mild to mod MR, mildly/mod reduced RV fxn, Mod TR, PASP .  [name] History of DVT (deep vein thrombosis)   . Dyslipidemia   . HTN (hypertension)   . Nonischemic cardiomyopathy     a. 06/2012 Echo: EF 15%;  b. 06/2012 Cath: nl except luminal irregs in LAD.  [name] Ventricular tachycardia     a. s/p ICD  . Goiter 2001  . Implantable cardiac defibrillator- Biotronik 2009    Single chamber  . [name] (left bundle branch block)     intermittent  . Palliative care patient    Past Surgical History  Procedure Laterality Date  . None    . Cardiac defibrillator placement  01/2009  . Insertion of implantable left ventricular assist device N/A 12/18/2012    Procedure: INSERTION OF IMPLANTABLE LEFT VENTRICULAR ASSIST DEVICE;  Surgeon: Kerin Perna, MD;  Location: [facility] OR;  Service: Open Heart Surgery;  Laterality: N/A;  . Intraoperative transesophageal echocardiogram N/A 12/18/2012    Procedure: INTRAOPERATIVE TRANSESOPHAGEAL ECHOCARDIOGRAM;  Surgeon: Kerin Perna, MD;  Location: [facility] OR;  Service: Open Heart Surgery;  Laterality: N/A;  . Tricuspid valve replacement N/A 12/18/2012   Procedure: TRICUSPID VALVE REPAIR;  Surgeon: Kerin Perna, MD;  Location: [facility] OR;  Service: Open Heart Surgery;  Laterality: N/A;  . Insertion of implantable left ventricular assist device N/A 12/18/2012    Procedure: INSERTION OF IMPLANTABLE RIGHT VENTRICULAR ASSIST DEVICE;  Surgeon: Kerin Perna, MD;  Location: [facility] OR;  Service: Open Heart Surgery;  Laterality: N/A;  . Removal of centrimag ventricular assist device N/A 12/25/2012    Procedure: REMOVAL OF CENTRIMAG VENTRICULAR ASSIST DEVICE;  Surgeon: Kerin Perna, MD;  Location: [facility] OR;  Service: Open Heart Surgery;  Laterality: N/A;  PUMP STANDBY  . Intraoperative transesophageal echocardiogram N/A 12/25/2012    Procedure: INTRAOPERATIVE TRANSESOPHAGEAL ECHOCARDIOGRAM;  Surgeon: Kerin Perna, MD;  Location: [facility] OR;  Service: Open Heart Surgery;  Laterality: N/A;  . Esophagogastroduodenoscopy N/A 12/26/2012    Procedure: ESOPHAGOGASTRODUODENOSCOPY (EGD);  Surgeon: Beverley Fiedler, MD;  Location: [facility] ENDOSCOPY;  Service: Gastroenterology;  Laterality: N/A;  Bedside  . Sternal closure N/A 12/27/2012    Procedure: STERNAL CLOSURE;  Surgeon: Kerin Perna, MD;  Location: [facility] OR;  Service: Thoracic;  Laterality: N/A;  . Mediastinal exploration N/A 12/27/2012    Procedure: MEDIASTINAL EXPLORATION;  Surgeon: Kerin Perna, MD;  Location: [facility] OR;  Service: Thoracic;  Laterality: N/A;   Family History  Problem Relation Age of Onset  . Emphysema Mother   . Heart disease Mother   . Heart attack Father   . Emphysema Brother    History  Substance Use Topics  .  Smoking status: Never Smoker   . Smokeless tobacco: Never Used     Comment: Exposed to secondhand smoke from both parents as a child  . Alcohol Use: No   OB History   Grav Para Term Preterm Abortions TAB SAB Ect Mult Living                 Review of Systems  Constitutional:       Per HPI, otherwise negative  HENT:       Per HPI, otherwise negative  Respiratory:       Per HPI,  otherwise negative  Cardiovascular:       Per HPI, otherwise negative  Gastrointestinal: Negative for vomiting.  Endocrine:       Negative aside from HPI  Genitourinary:       Neg aside from HPI   Musculoskeletal:       Per HPI, otherwise negative  Skin: Negative.   Neurological: Positive for syncope.      Allergies  Xopenex  Home Medications   Prior to Admission medications   Medication Sig Start Date End Date Taking? Authorizing Provider  acetaminophen (TYLENOL) 500 MG tablet Take 1,000 mg by mouth every 6 (six) hours as needed for mild pain.    Historical Provider, MD  amiodarone (PACERONE) 200 MG tablet Take 1 tablet (200 mg total) by mouth daily. 09/18/13   Dolores Pattyaniel R Bensimhon, MD  amLODipine (NORVASC) 10 MG tablet Take 1 tablet (10 mg total) by mouth daily. 09/05/13   Aundria RudAli B Cosgrove, NP  bisacodyl (DULCOLAX) 5 MG EC tablet Take 5 mg by mouth daily.    Historical Provider, MD  citalopram (CELEXA) 40 MG tablet Take 1 tablet (40 mg total) by mouth daily. 06/13/13   Laurey Moralealton S McLean, MD  DOBUTamine (DOBUTREX) 4-5 MG/ML-% infusion Inject 420.6 mcg/min into the vein continuous. 03/23/13   [name] D Filbert Schilderlegg, NP  docusate sodium (COLACE) 100 MG capsule Take 100 mg by mouth daily as needed for mild constipation.     Historical Provider, MD  ferrous sulfate 324 (65 FE) MG TBEC Take 1 tablet by mouth daily. 06/13/13   Dolores Pattyaniel R Bensimhon, MD  hydrochlorothiazide (HYDRODIURIL) 25 MG tablet Take 1 tablet (25 mg total) by mouth daily. 06/13/13   Laurey Moralealton S McLean, MD  Multiple Vitamins-Minerals (MULTIVITAMIN WITH MINERALS) tablet Take 1 tablet by mouth daily.    Historical Provider, MD  pantoprazole (PROTONIX) 40 MG tablet Take 1 tablet (40 mg total) by mouth daily. 06/13/13   Laurey Moralealton S McLean, MD  potassium chloride (K-DUR,KLOR-CON) 10 MEQ tablet Take 20 mEq by mouth.     Historical Provider, MD  sildenafil (REVATIO) 20 MG tablet Take 1 tablet (20 mg total) by mouth 3 (three) times daily. 08/02/13   Dolores Pattyaniel R  Bensimhon, MD  simethicone (MYLICON) 80 MG chewable tablet Chew 80 mg by mouth 2 (two) times daily.    Historical Provider, MD  simvastatin (ZOCOR) 10 MG tablet Take 1 tablet (10 mg total) by mouth at bedtime. 06/13/13   Laurey Moralealton S McLean, MD  spironolactone (ALDACTONE) 25 MG tablet Take 1 tablet (25 mg total) by mouth daily. 06/13/13   Laurey Moralealton S McLean, MD  torsemide (DEMADEX) 20 MG tablet Take 20 mg by mouth as needed.  06/15/13   Dolores Pattyaniel R Bensimhon, MD  warfarin (COUMADIN) 5 MG tablet Take 2.5-5 mg by mouth. Take as directed for INR goal 2.0 - 3.0    Historical Provider, MD   BP 96/0  Pulse 82  Temp(Src) 98.1 F (36.7 C) (Oral)  Resp 20  Ht 5\' 3"  (1.6 m)  Wt 129 lb (58.514 kg)  BMI 22.86 kg/m2  SpO2 99% Physical Exam  Nursing note and vitals reviewed. Constitutional: She is oriented to person, place, and time. She appears well-developed and well-nourished. No distress.  HENT:  Head: Normocephalic and atraumatic.  Eyes: Conjunctivae and EOM are normal.  Cardiovascular:  Constant mechanical whirring of left ventricular assist device upper she  Pulmonary/Chest: Effort normal and breath sounds normal. No stridor. No respiratory distress.  Abdominal: She exhibits no distension.  Musculoskeletal: She exhibits no edema.  Neurological: She is alert and oriented to person, place, and time. No cranial nerve deficit.  Skin: Skin is warm and dry.  Psychiatric: She has a normal mood and affect.    ED Course  Procedures (including critical care time) Labs Review Labs Reviewed  CBC - Abnormal; Notable for the following:    WBC 12.7 (*)    RBC 3.71 (*)    Hemoglobin 10.5 (*)    HCT 32.5 (*)    RDW 16.1 (*)    All other components within normal limits  COMPREHENSIVE METABOLIC PANEL - Abnormal; Notable for the following:    Sodium 136 (*)    Potassium 2.7 (*)    Chloride 90 (*)    BUN 25 (*)    Creatinine, Ser 1.66 (*)    Alkaline Phosphatase 207 (*)    Total Bilirubin 2.4 (*)    GFR  calc non Af Amer 31 (*)    GFR calc Af Amer 36 (*)    All other components within normal limits  PRO B NATRIURETIC PEPTIDE - Abnormal; Notable for the following:    Pro B Natriuretic peptide (BNP) 1759.0 (*)    All other components within normal limits  PROTIME-INR - Abnormal; Notable for the following:    Prothrombin Time 24.3 (*)    INR 2.27 (*)    All other components within normal limits  LACTATE DEHYDROGENASE - Abnormal; Notable for the following:    LDH 287 (*)    All other components within normal limits  MAGNESIUM  TROPONIN I    Imaging Review Dg Chest 2 View  10/10/2013   CLINICAL DATA:  ICD fired.  History [facility] T, hypertension, nonsmoker.  EXAM: CHEST  2 VIEW  COMPARISON:  08/28/2013  FINDINGS: Postoperative changes in the mediastinum. Single lead cardiac pacemaker. Cardiac assist device. Right PICC catheter with tip over the cavoatrial junction. No significant change in appearance or position of appliances since prior study. Borderline heart size with normal pulmonary vascularity. Blunting of the costophrenic angles is similar to prior study and may represent small effusions or pleural thickening. Linear scarring in the right mid lung. Fluid in the major fissures. No airspace disease or consolidation. No pneumothorax.  IMPRESSION: No change since prior study. Fluid or thickened pleura in the costophrenic angles with fluid in the fissures. Scarring in the right lung. Appliances are unchanged in position.   Electronically Signed   By: Burman Nieves M.D.   On: 10/10/2013 21:30     EKG Interpretation   Date/Time:  Wednesday October 10 2013 20:34:17 EDT Ventricular Rate:  89 PR Interval:  236 QRS Duration: 206 QT Interval:  449 QTC Calculation: 546 R Axis:   -86 Text Interpretation:  Sinus rhythm Multiform ventricular premature  complexes Prolonged PR interval Biatrial enlargement Right bundle branch  block LVH with secondary repolarization abnormality Artifact in lead(s) I  II  III aVR aVL aVF V2 V3 V4 V5 V6 and baseline wander in lead(s) V2 V3  Sinus rhythm Non-specific intra-ventricular conduction delay Premature  ventricular complexes Artifact No significant change since last tracing  Abnormal ekg Confirmed by Gerhard Munch  MD [phone]) on 10/10/2013 9:09:51  PM     After the initial evaluation I reviewed the patient with our cardiologist. On telemetry monitoring, the patient's implanted defibrillator had one episode of ventricular tachycardia, requiring firing.  MDM   Patient with a ventricular assist device, pacemaker now presents after an episode of syncope, 2 likely firings of her defibrillator. On exam patient is awake and alert, appropriately interactive.  However the patient's labs are notable for hypokalemia, elevated BNP, persistent kidney dysfunction. Patient was admitted for further evaluation and management.   Gerhard Munch, MD 10/10/13 2156

## 2013-10-10 NOTE — Consult Note (Signed)
ADVANCED HF TEAM H&P   HPI: [name] is a 68 y/o woman with h/o severe HTN, [name], VT s/ Biotronik ICD,CKD (Baseline Cr 1.5-1.6) , severe biventricular HF due to NICM s/p HM II LVAD implant who presents to ER for recurrent VT and ICD firing.   8/18 Underwent HM II LVAD implant (under destination criteria) along with TV repair (tricuspid annuloplasty). Post-op course c/b RHF requiring take back to the OR for bleeding and temproray RVAD support. Subsequently started on dobutamine for RV support.  We have been weaning dobutamine now a 2 mcg/kg/min. About 2 weeks ago had ICD firing. Amio increased. Yesterday called with fluid overload and told to take her diuretic. Had brisk diuresis over past 2 days. Fluid now back to baseline. Saw Dr. Ladona Ridgel yesterday and ICD reprogrammed. Tonight had abrupt syncopal episode and woke up on bathroom floor and ICD fired again. ICD interrogation shows VT at 225bpm with 3 ATPs followed by one shock which converted her to SR. In ER K= 2.7 Mag 1.8  Now feels fatigued but ok.  Wants to go home  LVAD interrogation reveals:  Speed: 9400  Flow: 3.7 Power: 5.0  PI: 4.3 (down from 5.1) Alarms: none  Events: ~20 PI events over past 2 days.  Fixed speed: 9400  Low speed limit: 8800    Review of Systems:     Cardiac Review of Systems: {Y] = yes [ ]  = no  Chest Pain [    ]  Resting SOB [   ] Exertional SOB  [name]  ]  [name] [  ]   Pedal Edema [  y ]    Palpitations [name]  ] Syncope  [ y ]   Presyncope [   ]  General Review of Systems: [Y] = yes [  ]=no Constitional: recent weight change [  ]; anorexia [  ]; fatigue [name]  ]; nausea [  ]; night sweats [  ]; fever [  ]; or chills [  ];                                                                      Eyes : blurred vision [  ]; diplopia [   ]; vision changes [  ];  Amaurosis fugax[  ]; Resp: cough [  ];  wheezing[  ];  hemoptysis[  ];  PND [  ];  GI:  gallstones[  ], vomiting[  ];  dysphagia[  ]; melena[  ];   hematochezia [  ]; heartburn[  ];   GU: kidney stones [  ]; hematuria[  ];   dysuria [  ];  nocturia[  ]; incontinence [  ];             Skin: rash, swelling[  ];, hair loss[  ];  peripheral edema[  ];  or itching[  ]; Musculosketetal: myalgias[  ];  joint swelling[  ];  joint erythema[  ];  joint pain[y  ];  back pain[  ];  Heme/Lymph: bruising[  ];  bleeding[  ];  anemia[  ];  Neuro: TIA[  ];  headaches[  ];  stroke[  ];  vertigo[  ];  seizures[  ];   paresthesias[  ];  difficulty walking[  ];  Psych:depression[  ]; anxiety[ y ];  Endocrine: diabetes[  ];  thyroid dysfunction[  ];  Other:  Past Medical History  Diagnosis Date  . Paroxysmal supraventricular tachycardia   . Hypokalemia   . Chronic systolic heart failure     a. 4/09812/2014 Echo: EF 15%, mod to sev antlat and inflat HK, inf AK, mild to mod MR, mildly/mod reduced RV fxn, Mod TR, PASP 46mmHg.  [name]. History of DVT (deep vein thrombosis)   . Dyslipidemia   . HTN (hypertension)   . Nonischemic cardiomyopathy     a. 06/2012 Echo: EF 15%;  b. 06/2012 Cath: nl except luminal irregs in LAD.  [name]. Ventricular tachycardia     a. s/p ICD  . Goiter 2001  . Implantable cardiac defibrillator- Biotronik 2009    Single chamber  . [name] (left bundle branch block)     intermittent  . Palliative care patient      (Not in a hospital admission)     Infusions:   Allergies  Allergen Reactions  . Xopenex [Levalbuterol Hcl]     Rash in mouth    History   Social History  . Marital Status: Single    Spouse Name: N/A    Number of Children: N/A  . Years of Education: N/A   Occupational History  . rservations Coordinator at [employer]    Social History Main Topics  . Smoking status: Never Smoker   . Smokeless tobacco: Never Used     Comment: Exposed to secondhand smoke from both parents as a child  . Alcohol Use: No  . Drug Use: No  . Sexual Activity: Not on file   Other Topics Concern  . 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 [place] in [place], not married. 2 daughters/           Family History  Problem Relation Age of Onset  . Emphysema Mother   . Heart disease Mother   . Heart attack Father   . Emphysema Brother     PHYSICAL EXAM: Filed Vitals:   10/10/13 2011  BP: 96/0  Pulse: 82  Temp:   Resp: 20    No intake or output data in the 24 hours ending 10/10/13 2039  General: Fatigued appearing, NAD; daughter present  HEENT: normal  Neck: supple. JVP 8-9 with prominent CV waves; Carotids 2+ bilaterally; no bruits. No lymphadenopathy or thryomegaly appreciated.  Cor: LVAD hum  Lungs: CTA  Abdomen: soft, nontender, no distention. No hepatosplenomegaly. No bruits or masses. Good bowel sounds. Driveline site looks good. Anchor in place  Extremities: no cyanosis, clubbing, rash, no edema. PICC line site ok  Neuro: alert & orientedx3, cranial nerves grossly intact. Moves all 4 extremities w/o difficulty. Affect pleasant.   ECG: Sinus rhythm   No results found for this or any previous visit (from the past 24 hour(s)). No results found.   ASSESSMENT: 1. Recurrent VT with ICD shock in setting of hypokalemia/hypomagnesemia 2. Chronic Systolic HF s/p HMII VAD placement 3. RV failure - dobutamine dependent 4. Restrictive lung disease 5. CKD, stage III  PLAN/DISCUSSION:  We will bring her in for observation. Supp K+ and Mag. Increase amio.  Will discuss with EP in am.  VAD interrogated personally. Parameters as above.    Truman HaywardDaniel Bensimhon,MD 8:39 PM

## 2013-10-10 NOTE — ED Notes (Signed)
The iv team is coming to chage all the connections for the picc line in the pt.  Pharmacy has brought there the hospital  Dosages for dobutamine then the pt will be transferred to 2000

## 2013-10-10 NOTE — ED Notes (Signed)
To x-ray

## 2013-10-10 NOTE — ED Notes (Signed)
Report called to rn on 2000

## 2013-10-10 NOTE — Assessment & Plan Note (Signed)
Her oxygen sat in the office is 94%. She appears a little on the wet side and I think she will be taking some additional diuretic. She will followup in CHF clinic.

## 2013-10-10 NOTE — Assessment & Plan Note (Signed)
We reprogrammed her device today. With her LVAD in place, I would rather not treat VT in the under 220 range as it might spontaneously terminate. She has not had much luck with ATP. If her VT were to spontaneously accelerate, then it would be appropriately treated.

## 2013-10-10 NOTE — ED Notes (Signed)
The pt apparently passed out earlier today and her defib fired.  She feels weak and sl nausea

## 2013-10-10 NOTE — H&P (Signed)
ADVANCED HF TEAM H&P   HPI: [name] is a 68 y/o woman with h/o severe HTN, [name], VT s/ Biotronik ICD,CKD (Baseline Cr 1.5-1.6) , severe biventricular HF due to NICM s/p HM II LVAD implant who presents to ER for recurrent VT and ICD firing.   8/18 Underwent HM II LVAD implant (under destination criteria) along with TV repair (tricuspid annuloplasty). Post-op course c/b RHF requiring take back to the OR for bleeding and temproray RVAD support. Subsequently started on dobutamine for RV support.  We have been weaning dobutamine now a 2 mcg/kg/min. About 2 weeks ago had ICD firing. Amio increased. Yesterday called with fluid overload and told to take her diuretic. Had brisk diuresis over past 2 days. Fluid now back to baseline. Saw Dr. Ladona Ridgel yesterday and ICD reprogrammed. Tonight had abrupt syncopal episode and woke up on bathroom floor and ICD fired again. ICD interrogation shows VT at 225bpm with 3 ATPs followed by one shock which converted her to SR. In ER K= 2.7 Mag 1.8  Now feels fatigued but ok.  Wants to go home  LVAD interrogation reveals:  Speed: 9400  Flow: 3.7 Power: 5.0  PI: 4.3 (down from 5.1) Alarms: none  Events: ~20 PI events over past 2 days.  Fixed speed: 9400  Low speed limit: 8800    Review of Systems:     Cardiac Review of Systems: {Y] = yes [ ]  = no  Chest Pain [    ]  Resting SOB [   ] Exertional SOB  [name]  ]  [name] [  ]   Pedal Edema [  y ]    Palpitations [name]  ] Syncope  [ y ]   Presyncope [   ]  General Review of Systems: [Y] = yes [  ]=no Constitional: recent weight change [  ]; anorexia [  ]; fatigue [name]  ]; nausea [  ]; night sweats [  ]; fever [  ]; or chills [  ];                                                                      Eyes : blurred vision [  ]; diplopia [   ]; vision changes [  ];  Amaurosis fugax[  ]; Resp: cough [  ];  wheezing[  ];  hemoptysis[  ];  PND [  ];  GI:  gallstones[  ], vomiting[  ];  dysphagia[  ]; melena[  ];   hematochezia [  ]; heartburn[  ];   GU: kidney stones [  ]; hematuria[  ];   dysuria [  ];  nocturia[  ]; incontinence [  ];             Skin: rash, swelling[  ];, hair loss[  ];  peripheral edema[  ];  or itching[  ]; Musculosketetal: myalgias[  ];  joint swelling[  ];  joint erythema[  ];  joint pain[y  ];  back pain[  ];  Heme/Lymph: bruising[  ];  bleeding[  ];  anemia[  ];  Neuro: TIA[  ];  headaches[  ];  stroke[  ];  vertigo[  ];  seizures[  ];   paresthesias[  ];  difficulty walking[  ];  Psych:depression[  ]; anxiety[ y ];  Endocrine: diabetes[  ];  thyroid dysfunction[  ];  Other:  Past Medical History  Diagnosis Date  . Paroxysmal supraventricular tachycardia   . Hypokalemia   . Chronic systolic heart failure     a. 05/6107 Echo: EF 15%, mod to sev antlat and inflat HK, inf AK, mild to mod MR, mildly/mod reduced RV fxn, Mod TR, PASP .  [name] History of DVT (deep vein thrombosis)   . Dyslipidemia   . HTN (hypertension)   . Nonischemic cardiomyopathy     a. 06/2012 Echo: EF 15%;  b. 06/2012 Cath: nl except luminal irregs in LAD.  [name] Ventricular tachycardia     a. s/p ICD  . Goiter 2001  . Implantable cardiac defibrillator- Biotronik 2009    Single chamber  . [name] (left bundle branch block)     intermittent  . Palliative care patient      (Not in a hospital admission)     Infusions:   Allergies  Allergen Reactions  . Xopenex [Levalbuterol Hcl]     Rash in mouth    History   Social History  . Marital Status: Single    Spouse Name: N/A    Number of Children: N/A  . Years of Education: N/A   Occupational History  . 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 Smoking status: Never Smoker   . Smokeless tobacco: Never Used     Comment: Exposed to secondhand smoke from both parents as a child  . Alcohol Use: No  . Drug Use: No  . Sexual Activity: Not on file   Other Topics Concern  . 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 Lives in [place], not married. 2 daughters/           Family History  Problem Relation Age of Onset  . Emphysema Mother   . Heart disease Mother   . Heart attack Father   . Emphysema Brother     PHYSICAL EXAM: Filed Vitals:   10/10/13 2011  BP: 96/0  Pulse: 82  Temp:   Resp: 20    No intake or output data in the 24 hours ending 10/10/13 2143  General: Fatigued appearing, NAD; daughter present  HEENT: normal  Neck: supple. JVP 8-9 with prominent CV waves; Carotids 2+ bilaterally; no bruits. No lymphadenopathy or thryomegaly appreciated.  Cor: LVAD hum  Lungs: CTA  Abdomen: soft, nontender, no distention. No hepatosplenomegaly. No bruits or masses. Good bowel sounds. Driveline site looks good. Anchor in place  Extremities: no cyanosis, clubbing, rash, no edema. PICC line site ok  Neuro: alert & orientedx3, cranial nerves grossly intact. Moves all 4 extremities w/o difficulty. Affect pleasant.   ECG: Sinus rhythm   Results for orders placed during the hospital encounter of 10/10/13 (from the past 24 hour(s))  PRO B NATRIURETIC PEPTIDE     Status: Abnormal   Collection Time    10/10/13  8:01 PM      Result Value Ref Range   Pro B Natriuretic peptide (BNP) 1759.0 (*) 0 - 125 pg/mL  TROPONIN I     Status: None   Collection Time    10/10/13  8:01 PM      Result Value Ref Range   Troponin I <0.30  <0.30 ng/mL  CBC     Status: Abnormal   Collection Time    10/10/13  8:17 PM      Result Value Ref Range   WBC 12.7 (*) 4.0 - 10.5 K/uL   RBC 3.71 (*) 3.87 - 5.11 MIL/uL   Hemoglobin 10.5 (*) 12.0 - 15.0 g/dL   HCT 98.132.5 (*) 19.136.0 - 47.846.0 %   MCV 87.6  78.0 - 100.0 fL   MCH 28.3  26.0 - 34.0 pg   MCHC 32.3  30.0 - 36.0 g/dL   RDW 29.516.1 (*) 62.111.5 - 30.815.5 %   Platelets 334  150 - 400 K/uL  COMPREHENSIVE METABOLIC PANEL     Status: Abnormal   Collection Time    10/10/13  8:17 PM      Result Value Ref Range   Sodium 136 (*) 137  - 147 mEq/L   Potassium 2.7 (*) 3.7 - 5.3 mEq/L   Chloride 90 (*) 96 - 112 mEq/L   CO2 25  19 - 32 mEq/L   Glucose, Bld 98  70 - 99 mg/dL   BUN 25 (*) 6 - 23 mg/dL   Creatinine, Ser 6.571.66 (*) 0.50 - 1.10 mg/dL   Calcium 84.610.4  8.4 - 96.210.5 mg/dL   Total Protein 8.2  6.0 - 8.3 g/dL   Albumin 4.5  3.5 - 5.2 g/dL   AST 27  0 - 37 U/L   ALT 25  0 - 35 U/L   Alkaline Phosphatase 207 (*) 39 - 117 U/L   Total Bilirubin 2.4 (*) 0.3 - 1.2 mg/dL   GFR calc non Af Amer 31 (*) >90 mL/min   GFR calc Af Amer 36 (*) >90 mL/min  MAGNESIUM     Status: None   Collection Time    10/10/13  8:17 PM      Result Value Ref Range   Magnesium 1.8  1.5 - 2.5 mg/dL  PROTIME-INR     Status: Abnormal   Collection Time    10/10/13  8:17 PM      Result Value Ref Range   Prothrombin Time 24.3 (*) 11.6 - 15.2 seconds   INR 2.27 (*) 0.00 - 1.49  LACTATE DEHYDROGENASE     Status: Abnormal   Collection Time    10/10/13  8:17 PM      Result Value Ref Range   LDH 287 (*) 94 - 250 U/L   Dg Chest 2 View  10/10/2013   CLINICAL DATA:  ICD fired.  History VA T, hypertension, nonsmoker.  EXAM: CHEST  2 VIEW  COMPARISON:  08/28/2013  FINDINGS: Postoperative changes in the mediastinum. Single lead cardiac pacemaker. Cardiac assist device. Right PICC catheter with tip over the cavoatrial junction. No significant change in appearance or position of appliances since prior study. Borderline heart size with normal pulmonary vascularity. Blunting of the costophrenic angles is similar to prior study and may represent small effusions or pleural thickening. Linear scarring in the right mid lung. Fluid in the major fissures. No airspace disease or consolidation. No pneumothorax.  IMPRESSION: No change since prior study. Fluid or thickened pleura in the costophrenic angles with fluid in the fissures. Scarring in the right lung. Appliances are unchanged in position.   Electronically Signed   By: Burman NievesWilliam  Stevens M.D.   On: 10/10/2013 21:30      ASSESSMENT: 1. Recurrent VT with ICD shock in setting of hypokalemia/hypomagnesemia 2. Chronic Systolic HF s/p HMII VAD placement 3. RV failure - dobutamine dependent 4. Restrictive lung disease 5. CKD, stage III  PLAN/DISCUSSION:  We will bring her in for observation.  Supp K+ and Mag. Increase amio.  Will discuss with EP in am.  VAD interrogated personally. Parameters as above.    [name] 9:43 PM

## 2013-10-10 NOTE — Progress Notes (Signed)
ANTICOAGULATION CONSULT NOTE - Initial Consult  Pharmacy Consult for warfarin Indication: VAD  Allergies  Allergen Reactions  . Xopenex [Levalbuterol Hcl]     Rash in mouth    Patient Measurements: Height: 5\' 3"  (160 cm) Weight: 129 lb (58.514 kg) IBW/kg (Calculated) : 52.4   Vital Signs: Temp: 98.1 F (36.7 C) (06/10 1955) Temp src: Oral (06/10 1955) BP: 96/0 mmHg (06/10 2011) Pulse Rate: 82 (06/10 2011)  Labs:  Recent Labs  10/10/13 2001 10/10/13 2017  HGB  --  10.5*  HCT  --  32.5*  PLT  --  334  LABPROT  --  24.3*  INR  --  2.27*  CREATININE  --  1.66*  TROPONINI <0.30  --     Estimated Creatinine Clearance: 27.9 ml/min (by C-G formula based on Cr of 1.66).   Medical History: Past Medical History  Diagnosis Date  . Paroxysmal supraventricular tachycardia   . Hypokalemia   . Chronic systolic heart failure     a. 10/3708 Echo: EF 15%, mod to sev antlat and inflat HK, inf AK, mild to mod MR, mildly/mod reduced RV fxn, Mod TR, PASP .  [name] History of DVT (deep vein thrombosis)   . Dyslipidemia   . HTN (hypertension)   . Nonischemic cardiomyopathy     a. 06/2012 Echo: EF 15%;  b. 06/2012 Cath: nl except luminal irregs in LAD.  [name] Ventricular tachycardia     a. s/p ICD  . Goiter 2001  . Implantable cardiac defibrillator- Biotronik 2009    Single chamber  . [name] (left bundle branch block)     intermittent  . Palliative care patient     Assessment: 24 YOF s/p LVAD placement who presented with recurrent VT and ICD firing. At home she takes warfarin 2.5mg  daily except 5mg  on Mondays, Wednesdays and Fridays. Last dose was yesterday. INR on admission is 2.27. Hgb 10.5, plts 334. No overt bleeding noted.  Goal of Therapy:  INR 2-3 Monitor platelets by anticoagulation protocol: Yes   Plan:  1. Warfarin 5mg  po x1 tonight to mirror home dose 2. Daily PT/INR 3. Follow for s/s bleeding, need for procedures  Kaitlin [name], PharmD, BCPS Clinical  Pharmacist Pager: [phone] 10/10/2013 10:04 PM

## 2013-10-10 NOTE — Progress Notes (Signed)
HPI Kaitlin [name] returns today for ICD followup. She is a pleasant 68 yo woman with a non-ischemic CM, chronic systolic heart failure, now endstage on Dobutamine, s/p LVAD, with VT. She recently received an ICD shock as her VT was treated with ATP and underwent acceleration into the VF zone where she was appropriately shocked. She has continued to struggle despite LVAD insertion. She has lost weight. She c/o breathlessness and uses additional doses of diuretic therapy as needed. She has had difficulty trying to wean off of her dobutamine.  Allergies  Allergen Reactions  . Xopenex [Levalbuterol Hcl]     Rash in mouth     Current Outpatient Prescriptions  Medication Sig Dispense Refill  . acetaminophen (TYLENOL) 500 MG tablet Take 1,000 mg by mouth every 6 (six) hours as needed for mild pain.      [name] amiodarone (PACERONE) 200 MG tablet Take 1 tablet (200 mg total) by mouth daily.  30 tablet  3  . amLODipine (NORVASC) 10 MG tablet Take 1 tablet (10 mg total) by mouth daily.  30 tablet  6  . bisacodyl (DULCOLAX) 5 MG EC tablet Take 5 mg by mouth daily.      . citalopram (CELEXA) 40 MG tablet Take 1 tablet (40 mg total) by mouth daily.  90 tablet  3  . DOBUTamine (DOBUTREX) 4-5 MG/ML-% infusion Inject 420.6 mcg/min into the vein continuous.  250 mL  6  . docusate sodium (COLACE) 100 MG capsule Take 100 mg by mouth daily as needed for mild constipation.       . ferrous sulfate 324 (65 FE) MG TBEC Take 1 tablet by mouth daily.      . hydrochlorothiazide (HYDRODIURIL) 25 MG tablet Take 1 tablet (25 mg total) by mouth daily.  30 tablet  3  . Multiple Vitamins-Minerals (MULTIVITAMIN WITH MINERALS) tablet Take 1 tablet by mouth daily.      . pantoprazole (PROTONIX) 40 MG tablet Take 1 tablet (40 mg total) by mouth daily.  90 tablet  3  . potassium chloride (K-DUR,KLOR-CON) 10 MEQ tablet Take 20 mEq by mouth.       . sildenafil (REVATIO) 20 MG tablet Take 1 tablet (20 mg total) by mouth 3 (three)  times daily.  90 tablet  6  . simethicone (MYLICON) 80 MG chewable tablet Chew 80 mg by mouth 2 (two) times daily.      . simvastatin (ZOCOR) 10 MG tablet Take 1 tablet (10 mg total) by mouth at bedtime.  30 tablet  2  . spironolactone (ALDACTONE) 25 MG tablet Take 1 tablet (25 mg total) by mouth daily.  30 tablet  2  . warfarin (COUMADIN) 5 MG tablet Take 2.5-5 mg by mouth. Take as directed for INR goal 2.0 - 3.0      . torsemide (DEMADEX) 20 MG tablet Take 20 mg by mouth as needed.        No current facility-administered medications for this visit.     Past Medical History  Diagnosis Date  . Paroxysmal supraventricular tachycardia   . Hypokalemia   . Chronic systolic heart failure     a. 10/8370 Echo: EF 15%, mod to sev antlat and inflat HK, inf AK, mild to mod MR, mildly/mod reduced RV fxn, Mod TR, PASP .  [name] History of DVT (deep vein thrombosis)   . Dyslipidemia   . HTN (hypertension)   . Nonischemic cardiomyopathy     a. 06/2012 Echo: EF 15%;  b. 06/2012 Cath: nl except luminal irregs in LAD.  [name]. Ventricular tachycardia     a. s/p ICD  . Goiter 2001  . Implantable cardiac defibrillator- Biotronik 2009    Single chamber  . [name] (left bundle branch block)     intermittent  . Palliative care patient     ROS:   All systems reviewed and negative except as noted in the HPI.   Past Surgical History  Procedure Laterality Date  . None    . Cardiac defibrillator placement  01/2009  . Insertion of implantable left ventricular assist device N/A 12/18/2012    Procedure: INSERTION OF IMPLANTABLE LEFT VENTRICULAR ASSIST DEVICE;  Surgeon: Kerin PernaPeter [name], MD;  [facility];  Service: Open Heart Surgery;  Laterality: N/A;  . Intraoperative transesophageal echocardiogram N/A 12/18/2012    Procedure: INTRAOPERATIVE TRANSESOPHAGEAL ECHOCARDIOGRAM;  Surgeon: Kerin PernaPeter [name], MD;  [facility];  Service: Open Heart Surgery;  Laterality: N/A;  . Tricuspid valve replacement N/A  12/18/2012    Procedure: TRICUSPID VALVE REPAIR;  Surgeon: Kerin PernaPeter [name], MD;  [facility];  Service: Open Heart Surgery;  Laterality: N/A;  . Insertion of implantable left ventricular assist device N/A 12/18/2012    Procedure: INSERTION OF IMPLANTABLE RIGHT VENTRICULAR ASSIST DEVICE;  Surgeon: Kerin PernaPeter [name], MD;  [facility];  Service: Open Heart Surgery;  Laterality: N/A;  . Removal of centrimag ventricular assist device N/A 12/25/2012    Procedure: REMOVAL OF CENTRIMAG VENTRICULAR ASSIST DEVICE;  Surgeon: Kerin PernaPeter [name], MD;  [facility];  Service: Open Heart Surgery;  Laterality: N/A;  PUMP STANDBY  . Intraoperative transesophageal echocardiogram N/A 12/25/2012    Procedure: INTRAOPERATIVE TRANSESOPHAGEAL ECHOCARDIOGRAM;  Surgeon: Kerin PernaPeter [name], MD;  [facility];  Service: Open Heart Surgery;  Laterality: N/A;  . Esophagogastroduodenoscopy N/A 12/26/2012    Procedure: ESOPHAGOGASTRODUODENOSCOPY (EGD);  Surgeon: Beverley FiedlerJay M Pyrtle, MD;  [facility];  Service: Gastroenterology;  Laterality: N/A;  Bedside  . Sternal closure N/A 12/27/2012    Procedure: STERNAL CLOSURE;  Surgeon: Kerin PernaPeter [name], MD;  [facility];  Service: Thoracic;  Laterality: N/A;  . Mediastinal exploration N/A 12/27/2012    Procedure: MEDIASTINAL EXPLORATION;  Surgeon: Kerin PernaPeter [name], MD;  [facility];  Service: Thoracic;  Laterality: N/A;     Family History  Problem Relation Age of Onset  . Emphysema Mother   . Heart disease Mother   . Heart attack Father   . Emphysema Brother      History   Social History  . Marital Status: Single    Spouse Name: N/A    Number of Children: N/A  . Years of Education: N/A   Occupational History  . rservations Coordinator at [employer]    Social History Main Topics  . Smoking status: Never Smoker   . Smokeless tobacco: Never Used     Comment: Exposed to secondhand smoke from both parents as a child  . Alcohol Use: No  . Drug Use: No  . Sexual  Activity: Not on file   Other Topics Concern  . 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 [place] in [place], not married. 2 daughters/            Pulse 92  Ht 5\' 3"  (1.6 m)  Wt 132 lb (59.875 kg)  BMI 23.39 kg/m2  Physical Exam:  Chronically ill appearing 68 yo woman, looking younger than her  stated aged, NAD HEENT: Unremarkable Neck:  No JVD, no thyromegally Back:  No CVA tenderness Lungs:  Clear except for basilar rales HEART: high frequency hum of LVA Abd:  soft, positive bowel sounds, no organomegally, no rebound, no guarding Ext:  2 plus pulses, no edema, no cyanosis, no clubbing Skin:  No rashes no nodules Neuro:  CN II through XII intact, motor grossly intact   DEVICE  Normal device function.  See PaceArt for details.   Assess/Plan:

## 2013-10-11 DIAGNOSIS — E876 Hypokalemia: Secondary | ICD-10-CM | POA: Diagnosis present

## 2013-10-11 DIAGNOSIS — Z95811 Presence of heart assist device: Secondary | ICD-10-CM | POA: Diagnosis not present

## 2013-10-11 DIAGNOSIS — I509 Heart failure, unspecified: Secondary | ICD-10-CM | POA: Diagnosis present

## 2013-10-11 DIAGNOSIS — I472 Ventricular tachycardia, unspecified: Secondary | ICD-10-CM | POA: Diagnosis not present

## 2013-10-11 DIAGNOSIS — E785 Hyperlipidemia, unspecified: Secondary | ICD-10-CM | POA: Diagnosis present

## 2013-10-11 DIAGNOSIS — Z7901 Long term (current) use of anticoagulants: Secondary | ICD-10-CM | POA: Diagnosis not present

## 2013-10-11 DIAGNOSIS — I129 Hypertensive chronic kidney disease with stage 1 through stage 4 chronic kidney disease, or unspecified chronic kidney disease: Secondary | ICD-10-CM | POA: Diagnosis present

## 2013-10-11 DIAGNOSIS — E049 Nontoxic goiter, unspecified: Secondary | ICD-10-CM | POA: Diagnosis present

## 2013-10-11 DIAGNOSIS — Z86718 Personal history of other venous thrombosis and embolism: Secondary | ICD-10-CM | POA: Diagnosis not present

## 2013-10-11 DIAGNOSIS — I428 Other cardiomyopathies: Secondary | ICD-10-CM

## 2013-10-11 DIAGNOSIS — Z79899 Other long term (current) drug therapy: Secondary | ICD-10-CM | POA: Diagnosis not present

## 2013-10-11 DIAGNOSIS — N183 Chronic kidney disease, stage 3 unspecified: Secondary | ICD-10-CM | POA: Diagnosis present

## 2013-10-11 DIAGNOSIS — I4729 Other ventricular tachycardia: Secondary | ICD-10-CM | POA: Diagnosis present

## 2013-10-11 DIAGNOSIS — J984 Other disorders of lung: Secondary | ICD-10-CM | POA: Diagnosis present

## 2013-10-11 DIAGNOSIS — I5022 Chronic systolic (congestive) heart failure: Secondary | ICD-10-CM | POA: Diagnosis present

## 2013-10-11 LAB — CARBOXYHEMOGLOBIN
CARBOXYHEMOGLOBIN: 0.5 % (ref 0.5–1.5)
Carboxyhemoglobin: 0.9 % (ref 0.5–1.5)
METHEMOGLOBIN: 1.5 % (ref 0.0–1.5)
Methemoglobin: 0.9 % (ref 0.0–1.5)
O2 SAT: 93 %
O2 SAT: 45.1 %
Total hemoglobin: 10.4 g/dL — ABNORMAL LOW (ref 12.0–16.0)
Total hemoglobin: 13 g/dL (ref 12.0–16.0)

## 2013-10-11 LAB — COMPREHENSIVE METABOLIC PANEL
ALBUMIN: 3.9 g/dL (ref 3.5–5.2)
ALT: 23 U/L (ref 0–35)
AST: 22 U/L (ref 0–37)
Alkaline Phosphatase: 188 U/L — ABNORMAL HIGH (ref 39–117)
BILIRUBIN TOTAL: 2.1 mg/dL — AB (ref 0.3–1.2)
BUN: 26 mg/dL — ABNORMAL HIGH (ref 6–23)
CHLORIDE: 100 meq/L (ref 96–112)
CO2: 23 mEq/L (ref 19–32)
CREATININE: 1.66 mg/dL — AB (ref 0.50–1.10)
Calcium: 10.2 mg/dL (ref 8.4–10.5)
GFR calc Af Amer: 36 mL/min — ABNORMAL LOW (ref >90)
GFR calc non Af Amer: 31 mL/min — ABNORMAL LOW (ref >90)
Glucose, Bld: 138 mg/dL — ABNORMAL HIGH (ref 70–99)
Potassium: 4.1 mEq/L (ref 3.7–5.3)
Sodium: 140 mEq/L (ref 137–147)
TOTAL PROTEIN: 7.4 g/dL (ref 6.0–8.3)

## 2013-10-11 LAB — PROTIME-INR
INR: 2.23 — AB (ref 0.00–1.49)
Prothrombin Time: 24 seconds — ABNORMAL HIGH (ref 11.6–15.2)

## 2013-10-11 LAB — MAGNESIUM: Magnesium: 2.5 mg/dL (ref 1.5–2.5)

## 2013-10-11 MED ORDER — ALTEPLASE 2 MG IJ SOLR
2.0000 mg | Freq: Once | INTRAMUSCULAR | Status: AC
  Administered 2013-10-11: 2 mg
  Filled 2013-10-11 (×2): qty 2

## 2013-10-11 MED ORDER — ADULT MULTIVITAMIN W/MINERALS CH
1.0000 | ORAL_TABLET | Freq: Every day | ORAL | Status: DC
  Administered 2013-10-11 – 2013-10-14 (×4): 1 via ORAL
  Filled 2013-10-11 (×4): qty 1

## 2013-10-11 MED ORDER — WARFARIN SODIUM 2.5 MG PO TABS
2.5000 mg | ORAL_TABLET | ORAL | Status: DC
  Administered 2013-10-11: 2.5 mg via ORAL
  Filled 2013-10-11 (×2): qty 1

## 2013-10-11 MED ORDER — CITALOPRAM HYDROBROMIDE 20 MG PO TABS
20.0000 mg | ORAL_TABLET | Freq: Every day | ORAL | Status: DC
  Administered 2013-10-12 – 2013-10-14 (×3): 20 mg via ORAL
  Filled 2013-10-11 (×3): qty 1

## 2013-10-11 MED ORDER — WARFARIN SODIUM 5 MG PO TABS
5.0000 mg | ORAL_TABLET | ORAL | Status: DC
  Filled 2013-10-11: qty 1

## 2013-10-11 MED ORDER — TRAMADOL HCL 50 MG PO TABS
50.0000 mg | ORAL_TABLET | Freq: Four times a day (QID) | ORAL | Status: DC | PRN
  Administered 2013-10-11: 50 mg via ORAL
  Administered 2013-10-12 – 2013-10-14 (×4): 100 mg via ORAL
  Filled 2013-10-11: qty 2
  Filled 2013-10-11: qty 1
  Filled 2013-10-11 (×3): qty 2

## 2013-10-11 MED ORDER — MAGNESIUM OXIDE 400 (241.3 MG) MG PO TABS
400.0000 mg | ORAL_TABLET | Freq: Every day | ORAL | Status: DC
  Administered 2013-10-11 – 2013-10-14 (×4): 400 mg via ORAL
  Filled 2013-10-11 (×5): qty 1

## 2013-10-11 NOTE — Progress Notes (Signed)
UR completed 

## 2013-10-11 NOTE — Progress Notes (Addendum)
Nutrition Brief Note  Patient identified on the Malnutrition Screening Tool (MST) Report for recent weight lost without tying (patient unsure).  Patient reports her weight highly fluctuates given fluid.  Wt Readings from Last 15 Encounters:  10/11/13 125 lb 14.1 oz (57.1 kg)  10/09/13 132 lb (59.875 kg)  10/03/13 136 lb 9.6 oz (61.961 kg)  09/18/13 141 lb 6 oz (64.127 kg)  09/05/13 141 lb 12.8 oz (64.32 kg)  08/28/13 133 lb (60.328 kg)  08/15/13 140 lb 3.2 oz (63.594 kg)  08/02/13 140 lb (63.504 kg)  07/26/13 145 lb 8 oz (65.998 kg)  07/09/13 155 lb (70.308 kg)  06/27/13 147 lb 12.8 oz (67.042 kg)  06/13/13 150 lb 12 oz (68.38 kg)  05/16/13 148 lb (67.132 kg)  05/02/13 161 lb 12 oz (73.369 kg)  04/18/13 150 lb 12.8 oz (68.402 kg)    Body mass index is 22.3 kg/(m^2). Patient meets criteria for Normal based on current BMI.   Current diet order is 2 gm Sodium.  Reports a good appetite.  Eating well at home.  Labs & medications reviewed.   No nutrition interventions warranted at this time. If nutrition issues arise, please consult RD.   Maureen Chatters, RD, LDN Pager #: [phone] After-Hours Pager #: [phone]

## 2013-10-11 NOTE — Progress Notes (Signed)
Pts PICC line declotted with TPA and co-ox 45%. Will turn Dobutamine up to 3 mcg and recheck in the am.  Aundria Rud 7:57 PM

## 2013-10-11 NOTE — Progress Notes (Signed)
tPA removed from PICC line x 1 ports.  GBR from each port, flushed with 10cc NS in each port and a new cap was placed.  Dewain Penning

## 2013-10-11 NOTE — Progress Notes (Signed)
HeartMate 2 Rounding Note  Subjective:    [name] is a 68 y/o woman with h/o severe HTN, [name], VT s/ Biotronik ICD,CKD (Baseline Cr 1.5-1.6) , severe biventricular HF due to NICM s/p HM II LVAD implant who presents to ER for recurrent VT and ICD firing.   8/18 Underwent HM II LVAD implant (under destination criteria) along with TV repair (tricuspid annuloplasty). Post-op course c/b RHF requiring take back to the OR for bleeding and temproray RVAD support. Subsequently started on dobutamine for RV support.   We have been weaning dobutamine now a 2 mcg/kg/min. About 2 weeks ago had ICD firing. Amio increased. Called earlier this week with fluid overload and told to take her diuretic. Had brisk diuresis over past 2 days. Saw Dr. Ladona Ridgel 6/10 and ICD reprogrammed. Last night had abrupt syncopal episode and woke up on bathroom floor and ICD fired again. ICD interrogation showed VT at 225bpm with 3 ATPs followed by one shock which converted her to SR. In ER K= 2.7 Mag 1.8  Supplemented K+ and Mag. Pending labs this morning. Denies SOB, orthopnea or CP. Complaints of R upper chest pain and L leg pain where she fell. Has bruise under R breast.   LVAD INTERROGATION:  HeartMate II LVAD:  Flow 3.8 liters/min, speed 9400, power 5.0, PI 4.5 No PI events overnight.  Objective:    Vital Signs:   Temp:  [98.1 F (36.7 C)-99.5 F (37.5 C)] 99 F (37.2 C) (06/11 0551) Pulse Rate:  [79-88] 79 (06/11 0551) Resp:  [18-22] 18 (06/10 2339) BP: (94-96)/(0) 94/0 mmHg (06/10 2253) SpO2:  [94 %-100 %] 94 % (06/11 0551) Weight:  [125 lb 14.1 oz (57.1 kg)-129 lb (58.514 kg)] 125 lb 14.1 oz (57.1 kg) (06/11 0500) Last BM Date: 10/10/13 Mean arterial Pressure 84-94  Intake/Output:   Intake/Output Summary (Last 24 hours) at 10/11/13 0716 Last data filed at 10/10/13 2339  Gross per 24 hour  Intake      0 ml  Output    400 ml  Net   -400 ml     Physical Exam: General:  Well appearing. No resp  difficulty HEENT: normal, R bruise under breast Neck: supple. JVP 8-9 with prominent CV waves . Carotids 2+ bilat; no bruits. No lymphadenopathy or thryomegaly appreciated. Cor: Mechanical heart sounds with LVAD hum present. Lungs: clear Abdomen: soft, nontender, nondistended. No hepatosplenomegaly. No bruits or masses. Good bowel sounds. Driveline: C/D/I; securement device intact and driveline incorporated Extremities: no cyanosis, clubbing, rash, edema, RUE 2L PICC Neuro: alert & orientedx3, cranial nerves grossly intact. moves all 4 extremities w/o difficulty. Affect pleasant  Telemetry: SR  Labs: Basic Metabolic Panel:  Recent Labs Lab 10/10/13 2017 10/11/13 0835  NA 136* 140  K 2.7* 4.1  CL 90* 100  CO2 25 23  GLUCOSE 98 138*  BUN 25* 26*  CREATININE 1.66* 1.66*  CALCIUM 10.4 10.2  MG 1.8 2.5    Liver Function Tests:  Recent Labs Lab 10/10/13 2017  AST 27  ALT 25  ALKPHOS 207*  BILITOT 2.4*  PROT 8.2  ALBUMIN 4.5   No results found for this basename: LIPASE, AMYLASE,  in the last 168 hours No results found for this basename: AMMONIA,  in the last 168 hours  CBC:  Recent Labs Lab 10/10/13 2017  WBC 12.7*  HGB 10.5*  HCT 32.5*  MCV 87.6  PLT 334    INR:  Recent Labs Lab 10/10/13 2017 10/11/13 0436  INR 2.27* 2.23*  Other results:     Imaging: Dg Chest 2 View  10/10/2013   CLINICAL DATA:  ICD fired.  History [facility] T, hypertension, nonsmoker.  EXAM: CHEST  2 VIEW  COMPARISON:  08/28/2013  FINDINGS: Postoperative changes in the mediastinum. Single lead cardiac pacemaker. Cardiac assist device. Right PICC catheter with tip over the cavoatrial junction. No significant change in appearance or position of appliances since prior study. Borderline heart size with normal pulmonary vascularity. Blunting of the costophrenic angles is similar to prior study and may represent small effusions or pleural thickening. Linear scarring in the right mid lung.  Fluid in the major fissures. No airspace disease or consolidation. No pneumothorax.  IMPRESSION: No change since prior study. Fluid or thickened pleura in the costophrenic angles with fluid in the fissures. Scarring in the right lung. Appliances are unchanged in position.   Electronically Signed   By: Burman NievesWilliam  Stevens M.D.   On: 10/10/2013 21:30      Medications:     Scheduled Medications: . ALPRAZolam  0.5 mg Oral QHS,MR X 1  . amiodarone  400 mg Oral BID  . amLODipine  10 mg Oral Daily  . citalopram  40 mg Oral Daily  . ferrous sulfate  325 mg Oral Daily  . multivitamin with minerals  1 tablet Oral Daily  . pantoprazole  40 mg Oral Daily  . sildenafil  20 mg Oral TID  . simvastatin  10 mg Oral QHS  . spironolactone  25 mg Oral Daily  . Warfarin - Pharmacist Dosing Inpatient   Does not apply q1800     Infusions: . DOBUTamine 2 mcg/kg/min (10/10/13 2353)     PRN Medications:  acetaminophen, bisacodyl, docusate sodium   Assessment:   1. Recurrent VT with ICD shock in setting of hypokalemia/hypomagnesemia  2. Chronic Systolic HF s/p HMII VAD placement  3. RV failure - dobutamine dependent  4. Restrictive lung disease  5. CKD, stage III  Plan/Discussion:    Stable overnight. Denies any SOB, orthopnea or CP. Complaints of R chest pain from where she fell and L hip.  LVAD parameters stable and no PI events overnight. Mag and K+ supplemented.   She remains on Dobutamine 2.0 mcg/kg/min and we have been trying to wean off on the outpatient side. She reports she has not noticed any increade SOB with weaning, however not sure if she would tell me because she really wants off. Interesting enough no issues with VT while she has been on dobutamine for over 6 months, however now that we are weaning have had 2 episodes of VT with shock. Continue amiodarone 400 mg BID. Will check co-ox today and if ok will continue to try to wean. May do RAMP ECHO while she is in the hospital to assess  RV and PA pressures so this does not have to be done as OP. Continue revatio 20 mg TID.   OOB and ambulate in the halls with CR.  Consult EP. EKG QTC 546 will decrease celexa to 20 mg daily.   I reviewed the LVAD parameters from today, and compared the results to the patient's prior recorded data.  No programming changes were made.  The LVAD is functioning within specified parameters.  The patient performs LVAD self-test daily.  LVAD interrogation was negative for any significant power changes, alarms or PI events/speed drops.  LVAD equipment check completed and is in good working order.  Back-up equipment present.   LVAD education done on emergency procedures and precautions and  reviewed exit site care.  Length of Stay: 1  Aundria Rud NP-C 10/11/2013, 7:16 AM  VAD Team --- VAD ISSUES ONLY--- Pager [phone] (7am - 7am)  Advanced Heart Failure Team  Pager [phone] (M-F; 7a - 4p)  Please contact [facility] for night-coverage after hours (4p -7a ) and weekends on [email or web address]   Patient seen and examined with Ulla Potash, NP. We discussed all aspects of the encounter. I agree with the assessment and plan as stated above.  EP recs appreciated. Suspect VT triggered by electrolyte abnormalities associated with recent diuresis. (she does not like to take her potassium). Electrolytes now improved. Would stop HCTZ and use spiro instead to help keep K up. Agree with trying to wean dobutamine in house and checking co-ox and ramp echo. Discussed ICD programming with EP. Given that she is on coumadin would like to have VT shock zone set lower so she gets shocked prior to passing out and falling. INR therapeutic. VAD parameters stable.   Jesus Poplin,MD 1:18 PM

## 2013-10-11 NOTE — Progress Notes (Signed)
ANTICOAGULATION CONSULT NOTE - Follow Up Consult  Pharmacy Consult for warfarin Indication: VAD  Allergies  Allergen Reactions  . Xopenex [Levalbuterol Hcl]     Rash in mouth    Patient Measurements: Height: 5\' 3"  (160 cm) Weight: 125 lb 14.1 oz (57.1 kg) IBW/kg (Calculated) : 52.4   Vital Signs: Temp: 99 F (37.2 C) (06/11 0551) Temp src: Oral (06/11 0551) BP: 94/0 mmHg (06/10 2253) Pulse Rate: 79 (06/11 0551)  Labs:  Recent Labs  10/10/13 2001 10/10/13 2017 10/11/13 0436  HGB  --  10.5*  --   HCT  --  32.5*  --   PLT  --  334  --   LABPROT  --  24.3* 24.0*  INR  --  2.27* 2.23*  CREATININE  --  1.66*  --   TROPONINI <0.30  --   --     Estimated Creatinine Clearance: 27.9 ml/min (by C-G formula based on Cr of 1.66).   Medical History: Past Medical History  Diagnosis Date  . Paroxysmal supraventricular tachycardia   . Hypokalemia   . Chronic systolic heart failure     a. 06/8766 Echo: EF 15%, mod to sev antlat and inflat HK, inf AK, mild to mod MR, mildly/mod reduced RV fxn, Mod TR, PASP .  [name] History of DVT (deep vein thrombosis)   . Dyslipidemia   . HTN (hypertension)   . Nonischemic cardiomyopathy     a. 06/2012 Echo: EF 15%;  b. 06/2012 Cath: nl except luminal irregs in LAD.  [name] Ventricular tachycardia     a. s/p ICD  . Goiter 2001  . Implantable cardiac defibrillator- Biotronik 2009    Single chamber  . [name] (left bundle branch block)     intermittent  . Palliative care patient     Assessment: Kaitlin [name] s/p LVAD placement who presented with recurrent VT and ICD firing. At home she takes warfarin 2.5mg  daily except 5mg  on Mondays, Wednesdays and Fridays.  INR on admission is 2.27> 2.23 today. Hgb 10.5, plts 334. No overt bleeding noted. Low K replaced, low magnesium replaced, amiodarone increased so may see increase in INR - will follow.  Goal of Therapy:  INR 2-3 Monitor platelets by anticoagulation protocol: Yes   Plan:  1. Warfarin 5mg   5mg  MWF / 2.5mg  TTSS - home dose 2. Daily PT/INR 3. Follow for s/s bleeding  Leota Sauers Pharm.D. CPP, BCPS Clinical Pharmacist [phone] 10/11/2013 7:50 AM

## 2013-10-11 NOTE — Consult Note (Signed)
ELECTROPHYSIOLOGY CONSULT NOTE   Patient ID: ADDALEE KAVANAGH MRN: [id number], DOB/AGE: 07-13-1945   Admit date: 10/10/2013 Date of Consult: 10/11/2013  Primary Physician: Mirna Mires, MD Primary Cardiologist: Gala Romney, MD Primary EP: Ladona Ridgel, MD Reason for Consultation: Recurrent VT  History of Present Illness Kaitlin [name] is a 68 y.o. female with severe biventricular HF due to NICM s/p HM II LVAD implant, HTN, [name], VT s/p Biotronik ICD implant and CKD (baseline Cr 1.5-1.6) who was admitted last night for recurrent VT and ICD firing.   Most recent cardiac history - August 2014 underwent HM II LVAD implant (under destination criteria) along with TV repair (tricuspid annuloplasty). Post-op course c/b right HF requiring take back to the OR for bleeding and temproray RVAD support. Subsequently started on dobutamine for RV support.   CHF team has been weaning dobutamine now on 2 mcg/kg/min. About 2 weeks ago she had ICD firing for VT. Her VT was initially treated with ATP which accelerated into the VF zone resulting in appropriate ICD shock. Amiodarone dose was increased. Shee saw Dr. Ladona Ridgel in follow-up yesterday and her ICD was reprogrammed.   A few days ago she called in with volume overload and her diuretic dose was increased. Last night she had abrupt syncope and woke up on bathroom floor. ICD interrogation showed VT at 225 bpm with 3 ATPs followed by one shock which converted her to SR. Potassium 2.7. Magnesium 1.8. She was admitted, Mg and K being repleted.  Past Medical History Past Medical History  Diagnosis Date  . Paroxysmal supraventricular tachycardia   . Hypokalemia   . Chronic systolic heart failure     a. 08/979 Echo: EF 15%, mod to sev antlat and inflat HK, inf AK, mild to mod MR, mildly/mod reduced RV fxn, Mod TR, PASP .  [name] History of DVT (deep vein thrombosis)   . Dyslipidemia   . HTN (hypertension)   . Nonischemic cardiomyopathy     a. 06/2012 Echo:  EF 15%;  b. 06/2012 Cath: nl except luminal irregs in LAD.  [name] Ventricular tachycardia     a. s/p ICD  . Goiter 2001  . Implantable cardiac defibrillator- Biotronik 2009    Single chamber  . [name] (left bundle branch block)     intermittent  . Palliative care patient     Past Surgical History Past Surgical History  Procedure Laterality Date  . None    . Cardiac defibrillator placement  01/2009  . Insertion of implantable left ventricular assist device N/A 12/18/2012    Procedure: INSERTION OF IMPLANTABLE LEFT VENTRICULAR ASSIST DEVICE;  Surgeon: Kerin Perna, MD;  Location[facility] OR;  Service: Open Heart Surgery;  Laterality: N/A;  . Intraoperative transesophageal echocardiogram N/A 12/18/2012    Procedure: INTRAOPERATIVE TRANSESOPHAGEAL ECHOCARDIOGRAM;  Surgeon: Kerin Perna, MD;  Location: [facility] OR;  Service: Open Heart Surgery;  Laterality: N/A;  . Tricuspid valve replacement N/A 12/18/2012    Procedure: TRICUSPID VALVE REPAIR;  Surgeon: Kerin Perna, MD;  Location: [facility] OR;  Service: Open Heart Surgery;  Laterality: N/A;  . Insertion of implantable left ventricular assist device N/A 12/18/2012    Procedure: INSERTION OF IMPLANTABLE RIGHT VENTRICULAR ASSIST DEVICE;  Surgeon: Kerin Perna, MD;  Location: [facility] OR;  Service: Open Heart Surgery;  Laterality: N/A;  . Removal of centrimag ventricular assist device N/A 12/25/2012    Procedure: REMOVAL OF CENTRIMAG VENTRICULAR ASSIST DEVICE;  Surgeon: Kerin Perna, MD;  Location: [facility] OR;  Service: Open  Heart Surgery;  Laterality: N/A;  PUMP STANDBY  . Intraoperative transesophageal echocardiogram N/A 12/25/2012    Procedure: INTRAOPERATIVE TRANSESOPHAGEAL ECHOCARDIOGRAM;  Surgeon: Kerin PernaPeter [name], MD;  [facility];  Service: Open Heart Surgery;  Laterality: N/A;  . Esophagogastroduodenoscopy N/A 12/26/2012    Procedure: ESOPHAGOGASTRODUODENOSCOPY (EGD);  Surgeon: Beverley FiedlerJay M Pyrtle, MD;  [facility];  Service: Gastroenterology;  Laterality:  N/A;  Bedside  . Sternal closure N/A 12/27/2012    Procedure: STERNAL CLOSURE;  Surgeon: Kerin PernaPeter [name], MD;  [facility];  Service: Thoracic;  Laterality: N/A;  . Mediastinal exploration N/A 12/27/2012    Procedure: MEDIASTINAL EXPLORATION;  Surgeon: Kerin PernaPeter [name], MD;  [facility];  Service: Thoracic;  Laterality: N/A;    Allergies/Intolerances Allergies  Allergen Reactions  . Xopenex [Levalbuterol Hcl]     Rash in mouth    Current Home Medications      acetaminophen 500 MG tablet  Commonly known as:  TYLENOL  Take 1,000 mg by mouth every 6 (six) hours as needed for mild pain.     amiodarone 200 MG tablet  Commonly known as:  PACERONE  Take 1 tablet (200 mg total) by mouth daily.     amLODipine 10 MG tablet  Commonly known as:  NORVASC  Take 1 tablet (10 mg total) by mouth daily.     bisacodyl 5 MG EC tablet  Commonly known as:  DULCOLAX  Take 5 mg by mouth daily as needed for mild constipation.     citalopram 40 MG tablet  Commonly known as:  CELEXA  Take 1 tablet (40 mg total) by mouth daily.     COLACE 100 MG capsule  Generic drug:  docusate sodium  Take 100 mg by mouth daily.     DOBUTamine 4-5 MG/ML-% infusion  Commonly known as:  DOBUTREX  Inject 420.6 mcg/min into the vein continuous.     DRY EYES OP  Place 2 drops into both eyes daily as needed (dry eyes).     ferrous sulfate 324 (65 FE) MG Tbec  Take 1 tablet by mouth daily.     hydrochlorothiazide 25 MG tablet  Commonly known as:  HYDRODIURIL  Take 1 tablet (25 mg total) by mouth daily.     multivitamin with minerals tablet  Take 1 tablet by mouth daily.     pantoprazole 40 MG tablet  Commonly known as:  PROTONIX  Take 1 tablet (40 mg total) by mouth daily.     potassium chloride 10 MEQ tablet  Commonly known as:  K-DUR,KLOR-CON  Take 10 mEq by mouth 2 (two) times daily.     sildenafil 20 MG tablet  Commonly known as:  REVATIO  Take 1 tablet (20 mg total) by mouth 3 (three) times  daily.     simvastatin 10 MG tablet  Commonly known as:  ZOCOR  Take 1 tablet (10 mg total) by mouth at bedtime.     spironolactone 25 MG tablet  Commonly known as:  ALDACTONE  Take 1 tablet (25 mg total) by mouth daily.     torsemide 20 MG tablet  Commonly known as:  DEMADEX  Take 20 mg by mouth as needed (hypertension).     warfarin 5 MG tablet  Commonly known as:  COUMADIN  Take 2.5-5 mg by mouth every evening. Take 5mg  on Monday, Wednesday and Friday.  Take 2.5mg  all other days of the week     Inpatient Medications . ALPRAZolam  0.5 mg Oral QHS,MR  X 1  . amiodarone  400 mg Oral BID  . amLODipine  10 mg Oral Daily  . citalopram  40 mg Oral Daily  . ferrous sulfate  325 mg Oral Daily  . magnesium oxide  400 mg Oral Daily  . multivitamin with minerals  1 tablet Oral Daily  . pantoprazole  40 mg Oral Daily  . sildenafil  20 mg Oral TID  . simvastatin  10 mg Oral QHS  . spironolactone  25 mg Oral Daily  . warfarin  2.5 mg Oral Q T,Th,S,Su-1800  . [START ON 10/12/2013] warfarin  5 mg Oral Q M,W,F-1800  . Warfarin - Pharmacist Dosing Inpatient   Does not apply q1800   . DOBUTamine 2 mcg/kg/min (10/10/13 2353)    Family History Family History  Problem Relation Age of Onset  . Emphysema Mother   . Heart disease Mother   . Heart attack Father   . Emphysema Brother      Social History History   Social History  . Marital Status: Single    Spouse Name: N/A    Number of Children: N/A  . Years of Education: N/A   Occupational History  . 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 Smoking status: Never Smoker   . Smokeless tobacco: Never Used     Comment: Exposed to secondhand smoke from both parents as a child  . Alcohol Use: No  . Drug Use: No  . Sexual Activity: Not on file   Other Topics Concern  . 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 Lives in [place], not married. 2  daughters/            Review of Systems General: No chills, fever, night sweats or weight changes  Cardiovascular:  No chest pain, dyspnea on exertion, edema, orthopnea, palpitations, paroxysmal nocturnal dyspnea Dermatological: No rash, lesions or masses Respiratory: No cough, dyspnea Urologic: No hematuria, dysuria Abdominal: No nausea, vomiting, diarrhea, bright red blood per rectum, melena, or hematemesis Neurologic: No visual changes, weakness, changes in mental status All other systems reviewed and are otherwise negative except as noted above.  Physical Exam Vitals: Blood pressure 94/0, pulse 79, temperature 99 F (37.2 C), temperature source Oral, resp. rate 18, height 5\' 3"  (1.6 m), weight 125 lb 14.1 oz (57.1 kg), SpO2 94.00%.  General: Well developed, well appearing 68 y.o. female in no acute distress. HEENT: Normocephalic, atraumatic. EOMs intact. Sclera nonicteric. Oropharynx clear.  Neck: Supple without bruits. +JVD. Lungs: Respirations regular and unlabored, CTA bilaterally. No wheezes, rales or rhonchi. Heart: LVAD hum. Abdomen: Soft, non-tender, non-distended. BS present x 4 quadrants.  Extremities: No clubbing, cyanosis or edema.  Psych: Normal affect. Neuro: Alert and oriented X 3. Moves all extremities spontaneously. Musculoskeletal: No kyphosis. Skin: Intact. Warm and dry. No rashes or petechiae in exposed areas.   Labs  Recent Labs  10/10/13 2001  TROPONINI <0.30   Lab Results  Component Value Date   WBC 12.7* 10/10/2013   HGB 10.5* 10/10/2013   HCT 32.5* 10/10/2013   MCV 87.6 10/10/2013   PLT 334 10/10/2013    Recent Labs Lab 10/10/13 2017  NA 136*  K 2.7*  CL 90*  CO2 25  BUN 25*  CREATININE 1.66*  CALCIUM 10.4  PROT 8.2  BILITOT 2.4*  ALKPHOS 207*  ALT 25  AST 27  GLUCOSE 98  Magnesium 1.7   Recent Labs  10/11/13 0436  INR 2.23*    Radiology/Studies Dg Chest 2 View 10/10/2013    FINDINGS: Postoperative changes in the  mediastinum. Single lead cardiac pacemaker. Cardiac assist device. Right PICC catheter with tip over the cavoatrial junction. No significant change in appearance or position of appliances since prior study. Borderline heart size with normal pulmonary vascularity. Blunting of the costophrenic angles is similar to prior study and may represent small effusions or pleural thickening. Linear scarring in the right mid lung. Fluid in the major fissures. No airspace disease or consolidation. No pneumothorax.   IMPRESSION: No change since prior study. Fluid or thickened pleura in the costophrenic angles with fluid in the fissures. Scarring in the right lung. Appliances are unchanged in position.    Electronically Signed   By: Burman Nieves M.D.   On: 10/10/2013 21:30   ekg reveals sinus rhythm with RBBB, prolonged QT  Device interrogation is reviewed Telemetry sinus rhythm with frequent ectopy  A/P 1. VT The patient has had recurrent hemodynamically unstable VT.  This occurred in the setting of hypokalemia and dobutamine use.  Her prognosis is guarded long term. I have reviewed her ICD interrogation and after discussion with Dr Ladona Ridgel this am, will continue with current programming. Would keep K> 3.9 and Mg>1.9 Wean dobutamine if able Amiodarone has been further increased IF she has further VT then I would advise adding mexiletine.  Dr Ladona Ridgel to follow with you

## 2013-10-12 DIAGNOSIS — I509 Heart failure, unspecified: Secondary | ICD-10-CM

## 2013-10-12 DIAGNOSIS — I472 Ventricular tachycardia: Secondary | ICD-10-CM | POA: Diagnosis not present

## 2013-10-12 DIAGNOSIS — I5022 Chronic systolic (congestive) heart failure: Secondary | ICD-10-CM | POA: Diagnosis not present

## 2013-10-12 DIAGNOSIS — R570 Cardiogenic shock: Secondary | ICD-10-CM | POA: Diagnosis not present

## 2013-10-12 DIAGNOSIS — I4729 Other ventricular tachycardia: Secondary | ICD-10-CM | POA: Diagnosis not present

## 2013-10-12 DIAGNOSIS — Z95811 Presence of heart assist device: Secondary | ICD-10-CM | POA: Diagnosis not present

## 2013-10-12 LAB — CARBOXYHEMOGLOBIN
Carboxyhemoglobin: 1 % (ref 0.5–1.5)
METHEMOGLOBIN: 1 % (ref 0.0–1.5)
O2 Saturation: 47.3 %
Total hemoglobin: 11.3 g/dL — ABNORMAL LOW (ref 12.0–16.0)

## 2013-10-12 LAB — COMPREHENSIVE METABOLIC PANEL
ALBUMIN: 3.8 g/dL (ref 3.5–5.2)
ALK PHOS: 175 U/L — AB (ref 39–117)
ALT: 21 U/L (ref 0–35)
AST: 23 U/L (ref 0–37)
BUN: 27 mg/dL — ABNORMAL HIGH (ref 6–23)
CALCIUM: 10.1 mg/dL (ref 8.4–10.5)
CO2: 24 mEq/L (ref 19–32)
Chloride: 98 mEq/L (ref 96–112)
Creatinine, Ser: 1.6 mg/dL — ABNORMAL HIGH (ref 0.50–1.10)
GFR calc non Af Amer: 33 mL/min — ABNORMAL LOW (ref >90)
GFR, EST AFRICAN AMERICAN: 38 mL/min — AB (ref >90)
Glucose, Bld: 99 mg/dL (ref 70–99)
POTASSIUM: 4.1 meq/L (ref 3.7–5.3)
Sodium: 138 mEq/L (ref 137–147)
Total Bilirubin: 1.6 mg/dL — ABNORMAL HIGH (ref 0.3–1.2)
Total Protein: 7.3 g/dL (ref 6.0–8.3)

## 2013-10-12 LAB — PROTIME-INR
INR: 2.98 — ABNORMAL HIGH (ref 0.00–1.49)
PROTHROMBIN TIME: 29.9 s — AB (ref 11.6–15.2)

## 2013-10-12 MED ORDER — WARFARIN SODIUM 1 MG PO TABS
1.0000 mg | ORAL_TABLET | Freq: Once | ORAL | Status: AC
  Administered 2013-10-12: 1 mg via ORAL
  Filled 2013-10-12: qty 1

## 2013-10-12 NOTE — Progress Notes (Signed)
HeartMate 2 Rounding Note  Subjective:    Kaitlin [name] is a 68 y/o woman with h/o severe HTN, [name], VT s/ Biotronik ICD,CKD (Baseline Cr 1.5-1.6) , severe biventricular HF due to NICM s/p HM II LVAD implant who presents to ER for recurrent VT and ICD firing.   8/18 Underwent HM II LVAD implant (under destination criteria) along with TV repair (tricuspid annuloplasty). Post-op course c/b RHF requiring take back to the OR for bleeding and temproray RVAD support. Subsequently started on dobutamine for RV support.   We have been weaning dobutamine down and presented when she was at 2 mcg/kg/min. About 2 weeks ago had ICD firing. Amio increased. Called earlier this week with fluid overload and told to take her diuretic. Had brisk diuresis. Saw Dr. Ladona Ridgel 6/10 and ICD reprogrammed. 6/10 had abrupt syncopal episode and woke up on bathroom floor and ICD fired again. ICD interrogation showed VT at 225bpm with 3 ATPs followed by one shock which converted her to SR. In ER K= 2.7 Mag 1.8  Dobutamine turned up to 3 mcg.kg.min d/t co-ox 45%. Pending co-ox this am. Denies SOB, orthopnea or CP. Still having pain on L hip and under R breast from where she fell. Weight up 4 lbs. Cr stable. INR 2.98  LVAD INTERROGATION:  HeartMate II LVAD:  Flow 4.1 liters/min, speed 9400, power 5.2, PI 5.9 8-10 PI events Objective:    Vital Signs:   Temp:  [98.3 F (36.8 C)-98.7 F (37.1 C)] 98.5 F (36.9 C) (06/12 0609) Resp:  [16] 16 (06/11 1536) SpO2:  [94 %-96 %] 95 % (06/12 0609) Weight:  [129 lb 3 oz (58.6 kg)] 129 lb 3 oz (58.6 kg) (06/12 0437) Last BM Date: 10/10/13 Mean arterial Pressure 74-88  Intake/Output:   Intake/Output Summary (Last 24 hours) at 10/12/13 0652 Last data filed at 10/12/13 0650  Gross per 24 hour  Intake 170.97 ml  Output    800 ml  Net -629.03 ml     Physical Exam: General:  Well appearing. No resp difficulty HEENT: normal, R bruise under breast and L hip hematoma Neck: supple. JVP  8-9 with prominent CV waves . Carotids 2+ bilat; no bruits. No lymphadenopathy or thryomegaly appreciated. Cor: Mechanical heart sounds with LVAD hum present. Lungs: clear Abdomen: soft, nontender, nondistended. No hepatosplenomegaly. No bruits or masses. Good bowel sounds. Driveline: C/D/I; securement device intact and driveline incorporated Extremities: no cyanosis, clubbing, rash, edema, RUE 2L PICC Neuro: alert & orientedx3, cranial nerves grossly intact. moves all 4 extremities w/o difficulty. Affect pleasant  Telemetry: SR  Labs: Basic Metabolic Panel:  Recent Labs Lab 10/10/13 2017 10/11/13 0835  NA 136* 140  K 2.7* 4.1  CL 90* 100  CO2 25 23  GLUCOSE 98 138*  BUN 25* 26*  CREATININE 1.66* 1.66*  CALCIUM 10.4 10.2  MG 1.8 2.5    Liver Function Tests:  Recent Labs Lab 10/10/13 2017 10/11/13 0835  AST 27 22  ALT 25 23  ALKPHOS 207* 188*  BILITOT 2.4* 2.1*  PROT 8.2 7.4  ALBUMIN 4.5 3.9   No results found for this basename: LIPASE, AMYLASE,  in the last 168 hours No results found for this basename: AMMONIA,  in the last 168 hours  CBC:  Recent Labs Lab 10/10/13 2017  WBC 12.7*  HGB 10.5*  HCT 32.5*  MCV 87.6  PLT 334    INR:  Recent Labs Lab 10/10/13 2017 10/11/13 0436 10/12/13 0500  INR 2.27* 2.23* 2.98*  Other results:     Imaging: Dg Chest 2 View  10/10/2013   CLINICAL DATA:  ICD fired.  History [facility] T, hypertension, nonsmoker.  EXAM: CHEST  2 VIEW  COMPARISON:  08/28/2013  FINDINGS: Postoperative changes in the mediastinum. Single lead cardiac pacemaker. Cardiac assist device. Right PICC catheter with tip over the cavoatrial junction. No significant change in appearance or position of appliances since prior study. Borderline heart size with normal pulmonary vascularity. Blunting of the costophrenic angles is similar to prior study and may represent small effusions or pleural thickening. Linear scarring in the right mid lung. Fluid in the  major fissures. No airspace disease or consolidation. No pneumothorax.  IMPRESSION: No change since prior study. Fluid or thickened pleura in the costophrenic angles with fluid in the fissures. Scarring in the right lung. Appliances are unchanged in position.   Electronically Signed   By: Burman Nieves M.D.   On: 10/10/2013 21:30     Medications:     Scheduled Medications: . ALPRAZolam  0.5 mg Oral QHS,MR X 1  . amiodarone  400 mg Oral BID  . amLODipine  10 mg Oral Daily  . citalopram  20 mg Oral Daily  . ferrous sulfate  325 mg Oral Daily  . magnesium oxide  400 mg Oral Daily  . multivitamin with minerals  1 tablet Oral Daily  . pantoprazole  40 mg Oral Daily  . sildenafil  20 mg Oral TID  . simvastatin  10 mg Oral QHS  . spironolactone  25 mg Oral Daily  . warfarin  2.5 mg Oral Q T,Th,S,Su-1800  . warfarin  5 mg Oral Q M,W,F-1800  . Warfarin - Pharmacist Dosing Inpatient   Does not apply q1800    Infusions: . DOBUTamine 3 mcg/kg/min (10/11/13 2000)    PRN Medications: acetaminophen, bisacodyl, docusate sodium, traMADol   Assessment:   1. Recurrent VT with ICD shock in setting of hypokalemia/hypomagnesemia  2. Chronic Systolic HF s/p HMII VAD placement  3. RV failure - dobutamine dependent  4. Restrictive lung disease  5. CKD, stage III  Plan/Discussion:    Stable overnight. CO-ox yesterday 45% and increased dobutamine to 3 mcg/kg/min. Patinet denies any SOB or orthopnea. Her Cr is stable and she is not experiencing any low flow alarms and flows based on pump are 3-4s. Low co-ox doesn't make perfect sense if LVAD is working well and she is not having low flows not exactly sure why it would be so low. May need to consider RHC to assess hemodynamics. Plan for RAMP ECHO today to assess LV unloading and RV.  LVAD parameters stable and 8-10 PI events in 24 hrs.  EP consulted and recommend trying to wean dobutamine off. If further VT would add mexiletine. Had long QTc so  Celexa decreased to 20 mg daily. Need to discuss whether we should reprogram ICD for lower threshold for VT so she hopefully gets shocked before having syncopal episode with being on coumadin. Now that we are weaning dobutamine she is having issues with VT, R side probably very compromised. Continue revatio 20 mg TID.  OOB and ambulate in the halls with CR.  I reviewed the LVAD parameters from today, and compared the results to the patient's prior recorded data.  No programming changes were made.  The LVAD is functioning within specified parameters.  The patient performs LVAD self-test daily.  LVAD interrogation was negative for any significant power changes, alarms or PI events/speed drops.  LVAD equipment check completed  and is in good working order.  Back-up equipment present.   LVAD education done on emergency procedures and precautions and reviewed exit site care.  Length of Stay: 2  Aundria RudCosgrove, Ali B NP-C 10/12/2013, 6:52 AM  VAD Team --- VAD ISSUES ONLY--- Pager [phone] (7am - 7am)  Advanced Heart Failure Team  Pager [phone] (M-F; 7a - 4p)  Please contact [facility] for night-coverage after hours (4p -7a ) and weekends on [email or web address]  Patient seen and examined with Ulla PotashAli Cosgrove, NP. We discussed all aspects of the encounter. I agree with the assessment and plan as stated above.    Co-ox down this morning. She is clearly dobutamine dependent due to RV failure. We did ramp echo to try to optimize but very little change. Speed turned down to 9200 to see if this will help RV keep up. If co-ox 55% or greater in am can go home otherwise consider increasing dobutamine to 4. Be careful not to overdiurese. ICD programming d/w Dr. Ladona Ridgelaylor. They will adjust VT therapy for more aggressive ATP with shorter delay to therapy.   VAD interrogated personally.   Daniel Bensimhon,MD 3:12 PM

## 2013-10-12 NOTE — Progress Notes (Signed)
Speed  Flow  PI  Power  LVIDD  AI  Aortic openings  MR  TR  Septum  RV   9400 3.5 4.4 4.9 5.0 trivial 0/0  mod  mod  Paced to right Markedlydilated; mod HK   9200  3.5 3.5 4.9 6.0 trivial 0/0 mod mod Paced to right   9600  3.3 3.8 5.1 5.3 trivial 0/0 mod mod                                              Ramp ECHO performed at bedside per Dr. Gala Romney At completion of ramp study, patients primary and back up controller programmed:  Fixed speed:  9200 RPM Low speed limit:  8600 RPM

## 2013-10-12 NOTE — Progress Notes (Signed)
ANTICOAGULATION CONSULT NOTE - Follow Up Consult  Pharmacy Consult for warfarin Indication: VAD  Allergies  Allergen Reactions  . Xopenex [Levalbuterol Hcl]     Rash in mouth    Patient Measurements: Height: 5\' 3"  (160 cm) Weight: 129 lb 3 oz (58.6 kg) IBW/kg (Calculated) : 52.4   Vital Signs: Temp: 98.5 F (36.9 C) (06/12 0609) Temp src: Oral (06/12 0609)  Labs:  Recent Labs  10/10/13 2001 10/10/13 2017 10/11/13 0436 10/11/13 0835 10/12/13 0500  HGB  --  10.5*  --   --   --   HCT  --  32.5*  --   --   --   PLT  --  334  --   --   --   LABPROT  --  24.3* 24.0*  --  29.9*  INR  --  2.27* 2.23*  --  2.98*  CREATININE  --  1.66*  --  1.66* 1.60*  TROPONINI <0.30  --   --   --   --     Estimated Creatinine Clearance: 29 ml/min (by C-G formula based on Cr of 1.6).   Medical History: Past Medical History  Diagnosis Date  . Paroxysmal supraventricular tachycardia   . Hypokalemia   . Chronic systolic heart failure     a. 0/6004 Echo: EF 15%, mod to sev antlat and inflat HK, inf AK, mild to mod MR, mildly/mod reduced RV fxn, Mod TR, PASP .  [name] History of DVT (deep vein thrombosis)   . Dyslipidemia   . HTN (hypertension)   . Nonischemic cardiomyopathy     a. 06/2012 Echo: EF 15%;  b. 06/2012 Cath: nl except luminal irregs in LAD.  [name] Ventricular tachycardia     a. s/p ICD  . Goiter 2001  . Implantable cardiac defibrillator- Biotronik 2009    Single chamber  . [name] (left bundle branch block)     intermittent  . Palliative care patient     Assessment: 9 YOF s/p LVAD placement who presented with recurrent VT and ICD firing. At home she takes warfarin 2.5mg  daily except 5mg  on Mondays, Wednesdays and Fridays.  INR 2.98 CBC stable. No overt bleeding noted. Low K replaced, low magnesium replaced, amiodarone increased so may see increase in INR - will follow.  Goal of Therapy:  INR 2-3 Monitor platelets by anticoagulation protocol: Yes   Plan:  1. warfarin  1mg  x1 2. Daily PT/INR 3. Follow for s/s bleeding  Leota Sauers Pharm.D. CPP, BCPS Clinical Pharmacist [phone] 10/12/2013 7:13 AM

## 2013-10-12 NOTE — Care Management Note (Unsigned)
    Page 1 of 1   10/12/2013     5:00:45 PM CARE MANAGEMENT NOTE 10/12/2013  Patient:  Kaitlin [name], Kaitlin [name]   Account Number:  [id number]  Date Initiated:  10/12/2013  Documentation initiated by:  Coleby Yett  Subjective/Objective Assessment:   Pt adm on 10/10/13 with syncope, ICD fire.  PTA, pt resides at home with family; is active with [facility] for home dobutamine.     Action/Plan:   Pt will need resumption of home care orders prior to dc. Please notify [facility] of pt discharge.   Anticipated DC Date:  10/13/2013   Anticipated DC Plan:  HOME W HOME HEALTH SERVICES      DC Planning Services  CM consult      [facility] Choice  Resumption Of Svcs/PTA Provider   Choice offered to / List presented to:             Status of service:  In process, will continue to follow Medicare Important Message given?   (If response is "NO", the following Medicare IM given date fields will be blank) Date Medicare IM given:   Date Additional Medicare IM given:    Discharge Disposition:    Per UR Regulation:  Reviewed for med. necessity/level of care/duration of stay  If discussed at Long Length of Stay Meetings, dates discussed:    Comments:

## 2013-10-13 DIAGNOSIS — I5023 Acute on chronic systolic (congestive) heart failure: Secondary | ICD-10-CM

## 2013-10-13 DIAGNOSIS — I5022 Chronic systolic (congestive) heart failure: Secondary | ICD-10-CM | POA: Diagnosis not present

## 2013-10-13 DIAGNOSIS — I509 Heart failure, unspecified: Secondary | ICD-10-CM | POA: Diagnosis not present

## 2013-10-13 DIAGNOSIS — I472 Ventricular tachycardia: Principal | ICD-10-CM

## 2013-10-13 DIAGNOSIS — I4729 Other ventricular tachycardia: Secondary | ICD-10-CM | POA: Diagnosis not present

## 2013-10-13 DIAGNOSIS — Z95811 Presence of heart assist device: Secondary | ICD-10-CM | POA: Diagnosis not present

## 2013-10-13 LAB — PROTIME-INR
INR: 2.73 — ABNORMAL HIGH (ref 0.00–1.49)
Prothrombin Time: 28 seconds — ABNORMAL HIGH (ref 11.6–15.2)

## 2013-10-13 LAB — CARBOXYHEMOGLOBIN
CARBOXYHEMOGLOBIN: 1.2 % (ref 0.5–1.5)
METHEMOGLOBIN: 1 % (ref 0.0–1.5)
O2 SAT: 49.1 %
TOTAL HEMOGLOBIN: 9.8 g/dL — AB (ref 12.0–16.0)

## 2013-10-13 LAB — COMPREHENSIVE METABOLIC PANEL
ALBUMIN: 3.8 g/dL (ref 3.5–5.2)
ALT: 19 U/L (ref 0–35)
AST: 21 U/L (ref 0–37)
Alkaline Phosphatase: 170 U/L — ABNORMAL HIGH (ref 39–117)
BILIRUBIN TOTAL: 1.7 mg/dL — AB (ref 0.3–1.2)
BUN: 26 mg/dL — ABNORMAL HIGH (ref 6–23)
CO2: 26 mEq/L (ref 19–32)
Calcium: 10.2 mg/dL (ref 8.4–10.5)
Chloride: 99 mEq/L (ref 96–112)
Creatinine, Ser: 1.36 mg/dL — ABNORMAL HIGH (ref 0.50–1.10)
GFR calc Af Amer: 46 mL/min — ABNORMAL LOW (ref >90)
GFR, EST NON AFRICAN AMERICAN: 40 mL/min — AB (ref >90)
Glucose, Bld: 87 mg/dL (ref 70–99)
Potassium: 4.4 mEq/L (ref 3.7–5.3)
Sodium: 136 mEq/L — ABNORMAL LOW (ref 137–147)
Total Protein: 7 g/dL (ref 6.0–8.3)

## 2013-10-13 MED ORDER — WARFARIN SODIUM 2.5 MG PO TABS
2.5000 mg | ORAL_TABLET | Freq: Every day | ORAL | Status: DC
  Administered 2013-10-13: 2.5 mg via ORAL
  Filled 2013-10-13 (×2): qty 1

## 2013-10-13 MED ORDER — SODIUM CHLORIDE 0.9 % IJ SOLN
10.0000 mL | INTRAMUSCULAR | Status: DC | PRN
  Administered 2013-10-13 – 2013-10-14 (×2): 10 mL

## 2013-10-13 NOTE — Progress Notes (Signed)
ANTICOAGULATION CONSULT NOTE - Follow Up Consult  Pharmacy Consult for warfarin Indication: VAD  Allergies  Allergen Reactions  . Xopenex [Levalbuterol Hcl]     Rash in mouth    Patient Measurements: Height: 5\' 3"  (160 cm) Weight: 130 lb 4.8 oz (59.104 kg) IBW/kg (Calculated) : 52.4   Vital Signs: Temp: 98.6 F (37 C) (06/13 0609) Temp src: Oral (06/13 0609)  Labs:  Recent Labs  10/10/13 2001  10/10/13 2017 10/11/13 0436 10/11/13 0835 10/12/13 0500 10/13/13 0450  HGB  --   --  10.5*  --   --   --   --   HCT  --   --  32.5*  --   --   --   --   PLT  --   --  334  --   --   --   --   LABPROT  --   < > 24.3* 24.0*  --  29.9* 28.0*  INR  --   < > 2.27* 2.23*  --  2.98* 2.73*  CREATININE  --   < > 1.66*  --  1.66* 1.60* 1.36*  TROPONINI <0.30  --   --   --   --   --   --   < > = values in this interval not displayed.  Estimated Creatinine Clearance: 34.1 ml/min (by C-G formula based on Cr of 1.36).   Medical History: Past Medical History  Diagnosis Date  . Paroxysmal supraventricular tachycardia   . Hypokalemia   . Chronic systolic heart failure     a. 05/8982 Echo: EF 15%, mod to sev antlat and inflat HK, inf AK, mild to mod MR, mildly/mod reduced RV fxn, Mod TR, PASP .  [name] History of DVT (deep vein thrombosis)   . Dyslipidemia   . HTN (hypertension)   . Nonischemic cardiomyopathy     a. 06/2012 Echo: EF 15%;  b. 06/2012 Cath: nl except luminal irregs in LAD.  [name] Ventricular tachycardia     a. s/p ICD  . Goiter 2001  . Implantable cardiac defibrillator- Biotronik 2009    Single chamber  . [name] (left bundle branch block)     intermittent  . Palliative care patient     Assessment: 33 YOF s/p LVAD placement who presented with recurrent VT and ICD firing. At home she takes warfarin 2.5mg  daily except 5mg  on Mondays, Wednesdays and Fridays.  INR 2.7 CBC stable. No overt bleeding noted. Low K replaced, low magnesium replaced, amiodarone increased so may see  increase in INR - will decrease dose of couamdin  Goal of Therapy:  INR 2-3 Monitor platelets by anticoagulation protocol: Yes   Plan:  1. warfarin 2.5mg  daily 2. Daily PT/INR 3. Follow for s/s bleeding  Leota Sauers Pharm.D. CPP, BCPS Clinical Pharmacist [phone] 10/13/2013 12:54 PM

## 2013-10-13 NOTE — Progress Notes (Signed)
HeartMate 2 Rounding Note  Subjective:    [name] is a 68 y/o woman with h/o severe HTN, [name], VT s/ Biotronik ICD,CKD (Baseline Cr 1.5-1.6) , severe biventricular HF due to NICM s/p HM II LVAD implant who presents to ER for recurrent VT and ICD firing.   8/18 Underwent HM II LVAD implant (under destination criteria) along with TV repair (tricuspid annuloplasty). Post-op course c/b RHF requiring take back to the OR for bleeding and temproray RVAD support. Subsequently started on dobutamine for RV support.   We have been weaning dobutamine down and presented when she was at 2 mcg/kg/min. About 2 weeks ago had ICD firing. Amio increased. Called earlier this week with fluid overload and told to take her diuretic. Had brisk diuresis. Saw Dr. Ladona Ridgelaylor 6/10 and ICD reprogrammed. 6/10 had abrupt syncopal episode and woke up on bathroom floor and ICD fired again. ICD interrogation showed VT at 225bpm with 3 ATPs followed by one shock which converted her to SR. In ER K= 2.7 Mag 1.8  Yesterday ramp ECHO and turned speed down to 9200 to hopefully allow RV to keep up. Co-ox this am 49%. Cr stable.  Denies SOB, orthopnea or CP. Pain improving on him and shoulder from fall. Weight stable.   INR 2.73. MAPs 80-90s  LVAD INTERROGATION:  HeartMate II LVAD:  Flow 3.5 liters/min, speed 9400, power 4.5, PI 4.5  2 PI events in 24 hrs Objective:    Vital Signs:   Temp:  [98.5 F (36.9 C)] 98.5 F (36.9 C) (06/12 0609) Pulse Rate:  [73] 73 (06/12 2056) SpO2:  [93 %-95 %] 93 % (06/12 2056) Last BM Date: 10/10/13 Mean arterial Pressure 80-92  Intake/Output:   Intake/Output Summary (Last 24 hours) at 10/13/13 0604 Last data filed at 10/12/13 2115  Gross per 24 hour  Intake 391.17 ml  Output   1300 ml  Net -908.83 ml     Physical Exam: General:  Well appearing. No resp difficulty, lying in bed HEENT: normal, R bruise under breast and L hip hematoma Neck: supple. JVP 8-9 with prominent CV waves .  Carotids 2+ bilat; no bruits. No lymphadenopathy or thryomegaly appreciated. Cor: Mechanical heart sounds with LVAD hum present. Lungs: clear Abdomen: soft, nontender, nondistended. No hepatosplenomegaly. No bruits or masses. Good bowel sounds. Driveline: C/D/I; securement device intact and driveline incorporated Extremities: no cyanosis, clubbing, rash, edema, RUE 2L PICC Neuro: alert & orientedx3, cranial nerves grossly intact. moves all 4 extremities w/o difficulty. Affect pleasant  Telemetry: SR 70s  Labs: Basic Metabolic Panel:  Recent Labs Lab 10/10/13 2017 10/11/13 0835 10/12/13 0500 10/13/13 0450  NA 136* 140 138 136*  K 2.7* 4.1 4.1 4.4  CL 90* 100 98 99  CO2 25 23 24 26   GLUCOSE 98 138* 99 87  BUN 25* 26* 27* 26*  CREATININE 1.66* 1.66* 1.60* 1.36*  CALCIUM 10.4 10.2 10.1 10.2  MG 1.8 2.5  --   --     Liver Function Tests:  Recent Labs Lab 10/10/13 2017 10/11/13 0835 10/12/13 0500 10/13/13 0450  AST 27 22 23 21   ALT 25 23 21 19   ALKPHOS 207* 188* 175* 170*  BILITOT 2.4* 2.1* 1.6* 1.7*  PROT 8.2 7.4 7.3 7.0  ALBUMIN 4.5 3.9 3.8 3.8   No results found for this basename: LIPASE, AMYLASE,  in the last 168 hours No results found for this basename: AMMONIA,  in the last 168 hours  CBC:  Recent Labs Lab 10/10/13 2017  WBC 12.7*  HGB 10.5*  HCT 32.5*  MCV 87.6  PLT 334    INR:  Recent Labs Lab 10/10/13 2017 10/11/13 0436 10/12/13 0500 10/13/13 0450  INR 2.27* 2.23* 2.98* 2.73*    Other results:     Imaging: No results found.   Medications:     Scheduled Medications: . ALPRAZolam  0.5 mg Oral QHS,MR X 1  . amiodarone  400 mg Oral BID  . amLODipine  10 mg Oral Daily  . citalopram  20 mg Oral Daily  . ferrous sulfate  325 mg Oral Daily  . magnesium oxide  400 mg Oral Daily  . multivitamin with minerals  1 tablet Oral Daily  . pantoprazole  40 mg Oral Daily  . sildenafil  20 mg Oral TID  . simvastatin  10 mg Oral QHS  .  spironolactone  25 mg Oral Daily  . Warfarin - Pharmacist Dosing Inpatient   Does not apply q1800    Infusions: . DOBUTamine 3 mcg/kg/min (10/11/13 2000)    PRN Medications: acetaminophen, bisacodyl, docusate sodium, traMADol   Assessment:   1. Recurrent VT with ICD shock in setting of hypokalemia/hypomagnesemia  2. Chronic Systolic HF s/p HMII VAD placement  3. RV failure - dobutamine dependent  4. Restrictive lung disease  5. CKD, stage III  Plan/Discussion:    Yesterday RAMP ECHO performed and speed turned down to 9200 to hopefully allow RV to keep up. Unfortunately the patient is dobutamine dependent and is not going to be able to be weaned off the dobutamine.  Co-ox remains low 49%, will increase dobutamine to 4 mcg/kg/min. Recheck co-ox tomorrow and if above 55% can go home. Continue Revatio 20 mg TID.  Volume status stable. Will continue to hold diuretics will likely just need PRN torsemide 20 mg for weight > 133 lbs. Denies any SOB or orthopnea.   Patient came to the hospital in the setting of VT s/p shock and was syncopal. Discussed with EP and would like to change VT therapy for more aggressive ATP with shorter delay to therapy. She is on anti-coagulation and would like to try and prevent another syncopal episode Do not see not from rep and patient reports she has not been back to change the settings. Will touch base with EP/ Biotroik rep today. This needs to be changed before discharge.  OOB and ambulate in the halls with CR.  INR therapeutic, pharmacy dosing.   I reviewed the LVAD parameters from today, and compared the results to the patient's prior recorded data.  No programming changes were made.  The LVAD is functioning within specified parameters.  The patient performs LVAD self-test daily.  LVAD interrogation was negative for any significant power changes, alarms or PI events/speed drops.  LVAD equipment check completed and is in good working order.  Back-up  equipment present.   LVAD education done on emergency procedures and precautions and reviewed exit site care.  Length of Stay: 3  Aundria Rud NP-C 10/13/2013, 6:04 AM  VAD Team --- VAD ISSUES ONLY--- Pager [phone] (7am - 7am)  Advanced Heart Failure Team  Pager [phone] (M-F; 7a - 4p)  Please contact [facility] for night-coverage after hours (4p -7a ) and weekends on [email or web address]  Patient seen and examined with Ulla Potash, NP. We discussed all aspects of the encounter. I agree with the assessment and plan as stated above.    Doing well. Co-ox was low this am so dobutamine increased to 4. Feels good. ICD  parameters changed today. If co-ox ~55% tomorrow am will plan d/c home. Keep off HCTZ due to hypokalemia. VAD parameters look good.   [name] 9:43 PM

## 2013-10-14 DIAGNOSIS — I509 Heart failure, unspecified: Secondary | ICD-10-CM | POA: Diagnosis not present

## 2013-10-14 DIAGNOSIS — Z95811 Presence of heart assist device: Secondary | ICD-10-CM | POA: Diagnosis not present

## 2013-10-14 DIAGNOSIS — I4729 Other ventricular tachycardia: Secondary | ICD-10-CM | POA: Diagnosis not present

## 2013-10-14 DIAGNOSIS — I5022 Chronic systolic (congestive) heart failure: Secondary | ICD-10-CM | POA: Diagnosis not present

## 2013-10-14 DIAGNOSIS — I472 Ventricular tachycardia: Secondary | ICD-10-CM | POA: Diagnosis not present

## 2013-10-14 LAB — COMPREHENSIVE METABOLIC PANEL
ALT: 19 U/L (ref 0–35)
AST: 20 U/L (ref 0–37)
Albumin: 3.8 g/dL (ref 3.5–5.2)
Alkaline Phosphatase: 171 U/L — ABNORMAL HIGH (ref 39–117)
BUN: 30 mg/dL — ABNORMAL HIGH (ref 6–23)
CO2: 25 meq/L (ref 19–32)
Calcium: 9.8 mg/dL (ref 8.4–10.5)
Chloride: 97 mEq/L (ref 96–112)
Creatinine, Ser: 1.65 mg/dL — ABNORMAL HIGH (ref 0.50–1.10)
GFR calc Af Amer: 37 mL/min — ABNORMAL LOW (ref >90)
GFR, EST NON AFRICAN AMERICAN: 32 mL/min — AB (ref >90)
Glucose, Bld: 87 mg/dL (ref 70–99)
Potassium: 4.4 mEq/L (ref 3.7–5.3)
SODIUM: 135 meq/L — AB (ref 137–147)
Total Bilirubin: 1.7 mg/dL — ABNORMAL HIGH (ref 0.3–1.2)
Total Protein: 7.1 g/dL (ref 6.0–8.3)

## 2013-10-14 LAB — CARBOXYHEMOGLOBIN
CARBOXYHEMOGLOBIN: 1.4 % (ref 0.5–1.5)
Methemoglobin: 1.1 % (ref 0.0–1.5)
O2 Saturation: 53.9 %
TOTAL HEMOGLOBIN: 9.6 g/dL — AB (ref 12.0–16.0)

## 2013-10-14 LAB — PROTIME-INR
INR: 2.36 — AB (ref 0.00–1.49)
Prothrombin Time: 25 seconds — ABNORMAL HIGH (ref 11.6–15.2)

## 2013-10-14 LAB — MAGNESIUM: Magnesium: 2.3 mg/dL (ref 1.5–2.5)

## 2013-10-14 MED ORDER — TORSEMIDE 20 MG PO TABS: 20.0000 mg | ORAL_TABLET | ORAL | Status: AC | PRN

## 2013-10-14 MED ORDER — WARFARIN SODIUM 2.5 MG PO TABS
2.5000 mg | ORAL_TABLET | ORAL | Status: DC
  Filled 2013-10-14: qty 1

## 2013-10-14 MED ORDER — WARFARIN SODIUM 5 MG PO TABS: 5.0000 mg | ORAL_TABLET | ORAL | Status: DC

## 2013-10-14 MED ORDER — SPIRONOLACTONE 25 MG PO TABS: 25.0000 mg | ORAL_TABLET | Freq: Two times a day (BID) | ORAL | Status: AC

## 2013-10-14 MED ORDER — CITALOPRAM HYDROBROMIDE 20 MG PO TABS: 20.0000 mg | ORAL_TABLET | Freq: Every day | ORAL | Status: AC

## 2013-10-14 MED ORDER — MAGNESIUM OXIDE -MG SUPPLEMENT 200 MG PO TABS: 200.0000 mg | ORAL_TABLET | Freq: Every day | ORAL | Status: AC

## 2013-10-14 MED ORDER — AMIODARONE HCL 200 MG PO TABS: 400.0000 mg | ORAL_TABLET | Freq: Two times a day (BID) | ORAL | Status: AC

## 2013-10-14 MED ORDER — SPIRONOLACTONE 25 MG PO TABS
25.0000 mg | ORAL_TABLET | Freq: Two times a day (BID) | ORAL | Status: DC
  Filled 2013-10-14 (×2): qty 1

## 2013-10-14 MED ORDER — DOBUTAMINE IN D5W 4-5 MG/ML-% IV SOLN: 4.0000 ug/kg/min | INTRAVENOUS | Status: AC

## 2013-10-14 MED ORDER — TRAMADOL HCL 50 MG PO TABS: 50.0000 mg | ORAL_TABLET | Freq: Three times a day (TID) | ORAL | Status: AC | PRN

## 2013-10-14 MED ORDER — POTASSIUM CHLORIDE CRYS ER 10 MEQ PO TBCR: 20.0000 meq | EXTENDED_RELEASE_TABLET | ORAL | Status: AC | PRN

## 2013-10-14 NOTE — Progress Notes (Signed)
ANTICOAGULATION CONSULT NOTE - Follow Up Consult  Pharmacy Consult for warfarin Indication: VAD  Allergies  Allergen Reactions  . Xopenex [Levalbuterol Hcl]     Rash in mouth    Patient Measurements: Height: 5\' 3"  (160 cm) Weight: 133 lb 13.1 oz (60.7 kg) IBW/kg (Calculated) : 52.4   Vital Signs: Temp: 98.7 F (37.1 C) (06/14 0531) Temp src: Oral (06/14 0531) Pulse Rate: 60 (06/14 0531)  Labs:  Recent Labs  10/12/13 0500 10/13/13 0450 10/14/13 0613  LABPROT 29.9* 28.0* 25.0*  INR 2.98* 2.73* 2.36*  CREATININE 1.60* 1.36* 1.65*    Estimated Creatinine Clearance: 28.1 ml/min (by C-G formula based on Cr of 1.65).   Medical History: Past Medical History  Diagnosis Date  . Paroxysmal supraventricular tachycardia   . Hypokalemia   . Chronic systolic heart failure     a. 06/9935 Echo: EF 15%, mod to sev antlat and inflat HK, inf AK, mild to mod MR, mildly/mod reduced RV fxn, Mod TR, PASP .  [name] History of DVT (deep vein thrombosis)   . Dyslipidemia   . HTN (hypertension)   . Nonischemic cardiomyopathy     a. 06/2012 Echo: EF 15%;  b. 06/2012 Cath: nl except luminal irregs in LAD.  [name] Ventricular tachycardia     a. s/p ICD  . Goiter 2001  . Implantable cardiac defibrillator- Biotronik 2009    Single chamber  . [name] (left bundle branch block)     intermittent  . Palliative care patient     Assessment: 33 YOF s/p LVAD placement who presented with recurrent VT and ICD firing.   INR 2.36 CBC stable. No overt bleeding noted. Low K replaced, low magnesium replaced, amiodarone increased so may see increase in INR - will decrease home dose of couamdin.  MAP 80-90 on amlodipine, spiro.  HR 60s  Goal of Therapy:  INR 2-3 Monitor platelets by anticoagulation protocol: Yes   Plan:  1. warfarin 2.5mg  daily except 5mg  M/F 2. Daily PT/INR 3. Follow for s/s bleeding  Leota Sauers Pharm.D. CPP, BCPS Clinical Pharmacist [phone] 10/14/2013 10:48 AM

## 2013-10-14 NOTE — Discharge Instructions (Signed)
Information on my medicine - Coumadin   (Warfarin)  This medication education was reviewed with me or my healthcare representative as part of my discharge preparation.  Why was Coumadin prescribed for you? Coumadin was prescribed for you because you have a blood clot or a medical condition that can cause an increased risk of forming blood clots. Blood clots can cause serious health problems by blocking the flow of blood to the heart, lung, or brain. Coumadin can prevent harmful blood clots from forming. As a reminder your indication for Coumadin is:   LVAD  What test will check on my response to Coumadin? While on Coumadin (warfarin) you will need to have an INR test regularly to ensure that your dose is keeping you in the desired range. The INR (international normalized ratio) number is calculated from the result of the laboratory test called prothrombin time (PT).  If an INR APPOINTMENT HAS NOT ALREADY BEEN MADE FOR YOU please schedule an appointment to have this lab work done by your health care provider within 7 days. Your INR goal is usually a number between:  2 to 3.  Ask your health care provider during an office visit what your goal INR is.  What  do you need to  know  About  COUMADIN? Take Coumadin (warfarin) exactly as prescribed by your healthcare provider about the same time each day.  DO NOT stop taking without talking to the doctor who prescribed the medication.  Stopping without other blood clot prevention medication to take the place of Coumadin may increase your risk of developing a new clot or stroke.  Get refills before you run out.  What do you do if you miss a dose? If you miss a dose, take it as soon as you remember on the same day then continue your regularly scheduled regimen the next day.  Do not take two doses of Coumadin at the same time.  Important Safety Information A possible side effect of Coumadin (Warfarin) is an increased risk of bleeding. You should call your  healthcare provider right away if you experience any of the following:   Bleeding from an injury or your nose that does not stop.   Unusual colored urine (red or dark brown) or unusual colored stools (red or black).   Unusual bruising for unknown reasons.   A serious fall or if you hit your head (even if there is no bleeding).  Some foods or medicines interact with Coumadin (warfarin) and might alter your response to warfarin. To help avoid this:   Eat a balanced diet, maintaining a consistent amount of Vitamin K.   Notify your provider about major diet changes you plan to make.   Avoid alcohol or limit your intake to 1 drink for women and 2 drinks for men per day. (1 drink is 5 oz. wine, 12 oz. beer, or 1.5 oz. liquor.)  Make sure that ANY health care provider who prescribes medication for you knows that you are taking Coumadin (warfarin).  Also make sure the healthcare provider who is monitoring your Coumadin knows when you have started a new medication including herbals and non-prescription products.  Coumadin (Warfarin)  Major Drug Interactions  Increased Warfarin Effect Decreased Warfarin Effect  Alcohol (large quantities) Antibiotics (esp. Septra/Bactrim, Flagyl, Cipro) Amiodarone (Cordarone) Aspirin (ASA) Cimetidine (Tagamet) Megestrol (Megace) NSAIDs (ibuprofen, naproxen, etc.) Piroxicam (Feldene) Propafenone (Rythmol SR) Propranolol (Inderal) Isoniazid (INH) Posaconazole (Noxafil) Barbiturates (Phenobarbital) Carbamazepine (Tegretol) Chlordiazepoxide (Librium) Cholestyramine (Questran) Griseofulvin Oral Contraceptives Rifampin Sucralfate (Carafate)  Vitamin K   Coumadin (Warfarin) Major Herbal Interactions  Increased Warfarin Effect Decreased Warfarin Effect  Garlic Ginseng Ginkgo biloba Coenzyme Q10 Green tea St. Johns wort    Coumadin (Warfarin) FOOD Interactions  Eat a consistent number of servings per week of foods HIGH in Vitamin K (1 serving =  cup)    Collards (cooked, or boiled & drained) Kale (cooked, or boiled & drained) Mustard greens (cooked, or boiled & drained) Parsley *serving size only =  cup Spinach (cooked, or boiled & drained) Swiss chard (cooked, or boiled & drained) Turnip greens (cooked, or boiled & drained)  Eat a consistent number of servings per week of foods MEDIUM-HIGH in Vitamin K (1 serving = 1 cup)  Asparagus (cooked, or boiled & drained) Broccoli (cooked, boiled & drained, or raw & chopped) Brussel sprouts (cooked, or boiled & drained) *serving size only =  cup Lettuce, raw (green leaf, endive, romaine) Spinach, raw Turnip greens, raw & chopped   These websites have more information on Coumadin (warfarin):  http://www.king-russell.com/; https://www.hines.net/;

## 2013-10-14 NOTE — Care Management Note (Signed)
CARE MANAGEMENT NOTE 10/14/2013  Patient:  Kaitlin [name], Kaitlin [name]   Account Number:  [id number]  Date Initiated:  10/12/2013  Documentation initiated by:  AMERSON,JULIE  Subjective/Objective Assessment:   Pt adm on 10/10/13 with syncope, ICD fire.  PTA, pt resides at home with family; is active with [facility] for home dobutamine.     Action/Plan:   Pt will need resumption of home care orders prior to dc. Please notify [facility] of pt discharge.   Anticipated DC Date:  10/14/2013   Anticipated DC Plan:  HOME W HOME HEALTH SERVICES      DC Planning Services  CM consult      [facility] Choice  Resumption Of Svcs/PTA Provider   Choice offered to / List presented to:          [facility] arranged  HH-1 RN      [facility] agency  [facility]   Status of service:  Completed, signed off Medicare Important Message given?   (If response is "NO", the following Medicare IM given date fields will be blank) Date Medicare IM given:   Date Additional Medicare IM given:    Discharge Disposition:  HOME W HOME HEALTH SERVICES  Per UR Regulation:  Reviewed for med. necessity/level of care/duration of stay  If discussed at Long Length of Stay Meetings, dates discussed:    Comments:  10/14/13 1221- ATIKAHALLRNCM [phone] Received call from patient's nurse indicating patient will be discharged today. She was active with [facility] prior to admission for Dobutamine gtt at home. Confirmed with Kristen with [facility] that a RN from [facility] is available for patient to discharge home today with Dobutamine gtt. [facility] RN will come to hospital. Resumption of care orders and script for Dobutamine was requested. Information relayed to patient's nurse. ATIKAKHALLRNCM [phone]

## 2013-10-14 NOTE — Progress Notes (Signed)
Discharged to home with family office visits in place teaching done  

## 2013-10-14 NOTE — Discharge Summary (Signed)
Advanced Heart Failure Team  Discharge Summary   Patient ID: Kaitlin [name] MRN: [id number], DOB/AGE: 1945-07-04 68 y.o. Admit date: 10/10/2013 D/C date:     10/14/2013   Primary Discharge Diagnoses:  1. Recurrent VT with ICD firing 2. Hypokalemia/hypomagnesemia 3. Chronic systolic HF s/p HMII VAD placement 8/14 4. Chronic right heart failure - inotrope dependent   Hospital Course:   Kaitlin [name] is a 68 y/o woman with a h/o severe HF due to NICM. She is s/p HMII LVAD on 8/14 with TV annuloplasty. Post VAD course has been complicated by RHF. She has been deemed not to be transplant candidate due to severe restrictive lung disease.  We have been weaning dobutamine down slowly in clinic and she was at 2 mcg/kg/min. About 2 weeks ago had ICD firing. Amio increased. Several days prior to admit had fluid overload and took her diuretic. Though she did not take her potassium with it. Had brisk diuresis. Saw Dr. Ladona Ridgel 6/10 and ICD reprogrammed. 6/10 had abrupt syncopal episode and woke up on bathroom floor and ICD fired again. ICD interrogation showed VT at 225bpm with 3 ATPs followed by one shock which converted her to SR. In ER K= 2.7 Mag 1.8  She was admitted through ER.  Her electrolytes were replaced aggressively. Co-ox was 45% so her dobutamine was titrated back to 4 mcg/kg/min. Ramp ECHO was done and speed decreased to 9200 to hopefully help with RV failure. Co-ox on d/c was 54%.  Her pain was treated with tramadol. As she is inconsistent with taking her HCTZ, we stopped it and increased spironolactone to 25 bid. Magnesium was added. She was seen by EP who adjusted her ICD to a more aggressive VT treatment algorithm to enable therapies prior to her passing out.   VAD interrogation Speed 9200 Flow 3.2 PI 5.5 Power 4.5 No alarms 4-5 PI events overnight  Summary of changes: 1. Dobutamine increased to 4 mcg/kg/min- 2. VAD speed decreased to 9200 3. HCTZ stopped- 4. Spiro increased to  25 bid- 5. Amio increased to 400 bid x 2 weeks- 6. Add mag oxide 200 daily- 7. ICD reprogrammed 8. Tramadol 50 tid PRN added for pain 9. Take demadex only for weight 135 or greater. Take with KCL 20.   Discharge Weight Range: 133 Discharge Vitals: Blood pressure 94/0, pulse 60, temperature 98.7 F (37.1 C), temperature source Oral, resp. rate 18, height 5\' 3"  (1.6 m), weight 133 lb 13.1 oz (60.7 kg), SpO2 92.00%. General: Well appearing. No resp difficulty, lying in bed  HEENT: normal, R bruise under breast and L hip hematoma  Neck: supple. JVP 10 with prominent CV waves . Carotids 2+ bilat; no bruits. No lymphadenopathy or thryomegaly appreciated.  Cor: Mechanical heart sounds with LVAD hum present.  Lungs: clear  Abdomen: soft, nontender, nondistended. No hepatosplenomegaly. No bruits or masses. Good bowel sounds.  Driveline: C/D/I; securement device intact and driveline incorporated  Extremities: no cyanosis, clubbing, rash, edema, RUE 2L PICC  Neuro: alert & orientedx3, cranial nerves grossly intact. moves all 4 extremities w/o difficulty. Affect pleasant   Labs: Lab Results  Component Value Date   WBC 12.7* 10/10/2013   HGB 10.5* 10/10/2013   HCT 32.5* 10/10/2013   MCV 87.6 10/10/2013   PLT 334 10/10/2013     Recent Labs Lab 10/14/13 0613  NA 135*  K 4.4  CL 97  CO2 25  BUN 30*  CREATININE 1.65*  CALCIUM 9.8  PROT 7.1  BILITOT 1.7*  ALKPHOS  171*  ALT 19  AST 20  GLUCOSE 87   Lab Results  Component Value Date   CHOL 153 04/14/2010   HDL 44.60 04/14/2010   LDLCALC 92 04/14/2010   TRIG 159* 12/25/2012   BNP (last 3 results)  Recent Labs  09/15/13 2204 10/03/13 1306 10/10/13 2001  PROBNP 1396.0* 1462.0* 1759.0*    Diagnostic Studies/Procedures   No results found.  Discharge Medications     Medication List    STOP taking these medications       hydrochlorothiazide 25 MG tablet  Commonly known as:  HYDRODIURIL      TAKE these medications        acetaminophen 500 MG tablet  Commonly known as:  TYLENOL  Take 1,000 mg by mouth every 6 (six) hours as needed for mild pain.     amiodarone 200 MG tablet  Commonly known as:  PACERONE  Take 2 tablets (400 mg total) by mouth 2 (two) times daily.     amLODipine 10 MG tablet  Commonly known as:  NORVASC  Take 1 tablet (10 mg total) by mouth daily.     bisacodyl 5 MG EC tablet  Commonly known as:  DULCOLAX  Take 5 mg by mouth daily as needed for mild constipation.     citalopram 20 MG tablet  Commonly known as:  CELEXA  Take 1 tablet (20 mg total) by mouth daily.     COLACE 100 MG capsule  Generic drug:  docusate sodium  Take 100 mg by mouth daily.     DOBUTamine 4-5 MG/ML-% infusion  Commonly known as:  DOBUTREX  Inject 234 mcg/min into the vein continuous.     DRY EYES OP  Place 2 drops into both eyes daily as needed (dry eyes).     ferrous sulfate 324 (65 FE) MG Tbec  Take 1 tablet by mouth daily.     Magnesium Oxide 200 MG Tabs  Take 1 tablet (200 mg total) by mouth daily.     multivitamin with minerals tablet  Take 1 tablet by mouth daily.     pantoprazole 40 MG tablet  Commonly known as:  PROTONIX  Take 1 tablet (40 mg total) by mouth daily.     potassium chloride 10 MEQ tablet  Commonly known as:  K-DUR,KLOR-CON  Take 2 tablets (20 mEq total) by mouth as needed (take only with Demadex (Torsemide)).     sildenafil 20 MG tablet  Commonly known as:  REVATIO  Take 1 tablet (20 mg total) by mouth 3 (three) times daily.     simvastatin 10 MG tablet  Commonly known as:  ZOCOR  Take 1 tablet (10 mg total) by mouth at bedtime.     spironolactone 25 MG tablet  Commonly known as:  ALDACTONE  Take 1 tablet (25 mg total) by mouth 2 (two) times daily.     torsemide 20 MG tablet  Commonly known as:  DEMADEX  Take 1 tablet (20 mg total) by mouth as needed (for weight 135 lbs or more).     traMADol 50 MG tablet  Commonly known as:  ULTRAM  Take 1 tablet (50 mg  total) by mouth every 8 (eight) hours as needed for moderate pain or severe pain.     warfarin 5 MG tablet  Commonly known as:  COUMADIN  Take 2.5-5 mg by mouth every evening. Take 5mg  on Monday, Wednesday and Friday.  Take 2.5mg  all other days of the week  Disposition   The patient will be discharged in stable condition to home. Discharge Instructions   (HEART FAILURE PATIENTS) Call MD:  Anytime you have any of the following symptoms: 1) 3 pound weight gain in 24 hours or 5 pounds in 1 week 2) shortness of breath, with or without a dry hacking cough 3) swelling in the hands, feet or stomach 4) if you have to sleep on extra pillows at night in order to breathe.    Complete by:  As directed      Diet - low sodium heart healthy    Complete by:  As directed      Increase activity slowly    Complete by:  As directed           Follow-up Information   Follow up with Arvilla Meresaniel Bensimhon, MD In 1 week. ([facility] will call to arrange)    Specialty:  Cardiology   Contact information:   [address] [place] [address] [phone]        The [facility] will call you on Monday for an appt this week  Duration of Discharge Encounter: Greater than 35 minutes   Signed, Tereso NewcomerScott Tylerjames Hoglund, PA-C   10/14/2013 12:33 PM

## 2013-10-17 ENCOUNTER — Other Ambulatory Visit (HOSPITAL_COMMUNITY): Payer: Self-pay | Admitting: *Deleted

## 2013-10-17 ENCOUNTER — Inpatient Hospital Stay (HOSPITAL_COMMUNITY)
Admission: AD | Admit: 2013-10-17 | Discharge: 2013-10-31 | DRG: 292 | Disposition: E | Payer: Medicare HMO | Source: Ambulatory Visit | Attending: Internal Medicine | Admitting: Internal Medicine

## 2013-10-17 ENCOUNTER — Ambulatory Visit (HOSPITAL_COMMUNITY)
Admission: RE | Admit: 2013-10-17 | Discharge: 2013-10-17 | Disposition: A | Discharge status: Home / Self Care | Payer: Medicare HMO | Source: Ambulatory Visit | Attending: Internal Medicine | Admitting: Internal Medicine

## 2013-10-17 ENCOUNTER — Encounter (HOSPITAL_COMMUNITY): Payer: Self-pay | Admitting: *Deleted

## 2013-10-17 DIAGNOSIS — N184 Chronic kidney disease, stage 4 (severe): Secondary | ICD-10-CM | POA: Diagnosis present

## 2013-10-17 DIAGNOSIS — Z95811 Presence of heart assist device: Secondary | ICD-10-CM

## 2013-10-17 DIAGNOSIS — I50810 Right heart failure, unspecified: Secondary | ICD-10-CM

## 2013-10-17 DIAGNOSIS — R0989 Other specified symptoms and signs involving the circulatory and respiratory systems: Secondary | ICD-10-CM | POA: Diagnosis present

## 2013-10-17 DIAGNOSIS — R42 Dizziness and giddiness: Secondary | ICD-10-CM | POA: Diagnosis not present

## 2013-10-17 DIAGNOSIS — J984 Other disorders of lung: Secondary | ICD-10-CM | POA: Diagnosis present

## 2013-10-17 DIAGNOSIS — Z515 Encounter for palliative care: Secondary | ICD-10-CM

## 2013-10-17 DIAGNOSIS — Z79899 Other long term (current) drug therapy: Secondary | ICD-10-CM | POA: Diagnosis not present

## 2013-10-17 DIAGNOSIS — I428 Other cardiomyopathies: Secondary | ICD-10-CM | POA: Diagnosis present

## 2013-10-17 DIAGNOSIS — Z7901 Long term (current) use of anticoagulants: Secondary | ICD-10-CM | POA: Diagnosis not present

## 2013-10-17 DIAGNOSIS — I472 Ventricular tachycardia, unspecified: Secondary | ICD-10-CM | POA: Diagnosis present

## 2013-10-17 DIAGNOSIS — I129 Hypertensive chronic kidney disease with stage 1 through stage 4 chronic kidney disease, or unspecified chronic kidney disease: Secondary | ICD-10-CM | POA: Diagnosis present

## 2013-10-17 DIAGNOSIS — E785 Hyperlipidemia, unspecified: Secondary | ICD-10-CM | POA: Diagnosis present

## 2013-10-17 DIAGNOSIS — N189 Chronic kidney disease, unspecified: Secondary | ICD-10-CM

## 2013-10-17 DIAGNOSIS — I4729 Other ventricular tachycardia: Secondary | ICD-10-CM | POA: Diagnosis present

## 2013-10-17 DIAGNOSIS — I5023 Acute on chronic systolic (congestive) heart failure: Secondary | ICD-10-CM

## 2013-10-17 DIAGNOSIS — E871 Hypo-osmolality and hyponatremia: Secondary | ICD-10-CM | POA: Diagnosis not present

## 2013-10-17 DIAGNOSIS — Z86718 Personal history of other venous thrombosis and embolism: Secondary | ICD-10-CM

## 2013-10-17 DIAGNOSIS — R570 Cardiogenic shock: Secondary | ICD-10-CM | POA: Diagnosis present

## 2013-10-17 DIAGNOSIS — I447 Left bundle-branch block, unspecified: Secondary | ICD-10-CM | POA: Diagnosis present

## 2013-10-17 DIAGNOSIS — I509 Heart failure, unspecified: Secondary | ICD-10-CM | POA: Diagnosis present

## 2013-10-17 DIAGNOSIS — Z8249 Family history of ischemic heart disease and other diseases of the circulatory system: Secondary | ICD-10-CM

## 2013-10-17 DIAGNOSIS — R531 Weakness: Secondary | ICD-10-CM

## 2013-10-17 DIAGNOSIS — I5022 Chronic systolic (congestive) heart failure: Secondary | ICD-10-CM

## 2013-10-17 DIAGNOSIS — R0609 Other forms of dyspnea: Secondary | ICD-10-CM | POA: Diagnosis present

## 2013-10-17 DIAGNOSIS — N179 Acute kidney failure, unspecified: Secondary | ICD-10-CM | POA: Diagnosis present

## 2013-10-17 HISTORY — DX: Presence of automatic (implantable) cardiac defibrillator: Z95.810

## 2013-10-17 LAB — COMPREHENSIVE METABOLIC PANEL
ALT: 29 U/L (ref 0–35)
AST: 32 U/L (ref 0–37)
Albumin: 4.2 g/dL (ref 3.5–5.2)
Alkaline Phosphatase: 178 U/L — ABNORMAL HIGH (ref 39–117)
BILIRUBIN TOTAL: 2.6 mg/dL — AB (ref 0.3–1.2)
BUN: 41 mg/dL — AB (ref 6–23)
CALCIUM: 10.1 mg/dL (ref 8.4–10.5)
CHLORIDE: 97 meq/L (ref 96–112)
CO2: 21 meq/L (ref 19–32)
CREATININE: 2.1 mg/dL — AB (ref 0.50–1.10)
GFR calc Af Amer: 27 mL/min — ABNORMAL LOW (ref >90)
GFR, EST NON AFRICAN AMERICAN: 24 mL/min — AB (ref >90)
GLUCOSE: 118 mg/dL — AB (ref 70–99)
Potassium: 3.7 mEq/L (ref 3.7–5.3)
Sodium: 137 mEq/L (ref 137–147)
Total Protein: 7.4 g/dL (ref 6.0–8.3)

## 2013-10-17 LAB — CBC
HCT: 29 % — ABNORMAL LOW (ref 36.0–46.0)
HEMOGLOBIN: 9.6 g/dL — AB (ref 12.0–15.0)
MCH: 29.3 pg (ref 26.0–34.0)
MCHC: 33.1 g/dL (ref 30.0–36.0)
MCV: 88.4 fL (ref 78.0–100.0)
Platelets: 303 10*3/uL (ref 150–400)
RBC: 3.28 MIL/uL — ABNORMAL LOW (ref 3.87–5.11)
RDW: 16.4 % — ABNORMAL HIGH (ref 11.5–15.5)
WBC: 9.9 10*3/uL (ref 4.0–10.5)

## 2013-10-17 LAB — CARBOXYHEMOGLOBIN
Carboxyhemoglobin: 1.4 % (ref 0.5–1.5)
Methemoglobin: 1.2 % (ref 0.0–1.5)
O2 Saturation: 83.3 %
TOTAL HEMOGLOBIN: 9.3 g/dL — AB (ref 12.0–16.0)

## 2013-10-17 LAB — PRO B NATRIURETIC PEPTIDE: Pro B Natriuretic peptide (BNP): 3826 pg/mL — ABNORMAL HIGH (ref 0–125)

## 2013-10-17 LAB — PROTIME-INR
INR: 2.03 — AB (ref 0.00–1.49)
Prothrombin Time: 22.3 seconds — ABNORMAL HIGH (ref 11.6–15.2)

## 2013-10-17 LAB — MRSA PCR SCREENING: MRSA BY PCR: NEGATIVE

## 2013-10-17 LAB — LACTATE DEHYDROGENASE: LDH: 305 U/L — AB (ref 94–250)

## 2013-10-17 MED ORDER — DOCUSATE SODIUM 100 MG PO CAPS: 100.0000 mg | ORAL_CAPSULE | Freq: Every day | ORAL | Status: DC

## 2013-10-17 MED ORDER — CITALOPRAM HYDROBROMIDE 20 MG PO TABS
20.0000 mg | ORAL_TABLET | Freq: Every day | ORAL | Status: DC
  Administered 2013-10-18 – 2013-10-27 (×10): 20 mg via ORAL
  Filled 2013-10-17 (×10): qty 1

## 2013-10-17 MED ORDER — HYDRALAZINE HCL 20 MG/ML IJ SOLN: 5.0000 mg | INTRAMUSCULAR | Status: DC | PRN

## 2013-10-17 MED ORDER — AMLODIPINE BESYLATE 10 MG PO TABS
10.0000 mg | ORAL_TABLET | Freq: Every day | ORAL | Status: DC
  Administered 2013-10-18 – 2013-10-27 (×10): 10 mg via ORAL
  Filled 2013-10-17 (×10): qty 1

## 2013-10-17 MED ORDER — ZOLPIDEM TARTRATE 5 MG PO TABS
5.0000 mg | ORAL_TABLET | Freq: Every evening | ORAL | Status: DC | PRN
  Administered 2013-10-18 – 2013-10-26 (×8): 5 mg via ORAL
  Filled 2013-10-17 (×8): qty 1

## 2013-10-17 MED ORDER — PANTOPRAZOLE SODIUM 40 MG PO TBEC
40.0000 mg | DELAYED_RELEASE_TABLET | Freq: Every day | ORAL | Status: DC
  Administered 2013-10-18 – 2013-10-27 (×10): 40 mg via ORAL
  Filled 2013-10-17 (×10): qty 1

## 2013-10-17 MED ORDER — WARFARIN - PHARMACIST DOSING INPATIENT
Freq: Every day | Status: DC
  Administered 2013-10-17 – 2013-10-24 (×5)

## 2013-10-17 MED ORDER — MAGNESIUM OXIDE 400 (241.3 MG) MG PO TABS: 200.0000 mg | ORAL_TABLET | Freq: Every day | ORAL | Status: DC

## 2013-10-17 MED ORDER — POTASSIUM CHLORIDE CRYS ER 20 MEQ PO TBCR
20.0000 meq | EXTENDED_RELEASE_TABLET | Freq: Every day | ORAL | Status: DC
  Administered 2013-10-18 – 2013-10-25 (×8): 20 meq via ORAL
  Filled 2013-10-17 (×9): qty 1

## 2013-10-17 MED ORDER — SIMVASTATIN 10 MG PO TABS
10.0000 mg | ORAL_TABLET | Freq: Every day | ORAL | Status: DC
  Administered 2013-10-17 – 2013-10-26 (×10): 10 mg via ORAL
  Filled 2013-10-17 (×11): qty 1

## 2013-10-17 MED ORDER — MAGNESIUM OXIDE 400 (241.3 MG) MG PO TABS
200.0000 mg | ORAL_TABLET | Freq: Every day | ORAL | Status: DC
  Administered 2013-10-18 – 2013-10-27 (×10): 200 mg via ORAL
  Filled 2013-10-17 (×10): qty 0.5

## 2013-10-17 MED ORDER — AMIODARONE HCL 200 MG PO TABS
200.0000 mg | ORAL_TABLET | Freq: Two times a day (BID) | ORAL | Status: DC
  Administered 2013-10-17 – 2013-10-20 (×7): 200 mg via ORAL
  Filled 2013-10-17 (×9): qty 1

## 2013-10-17 MED ORDER — POTASSIUM CHLORIDE CRYS ER 20 MEQ PO TBCR: 20.0000 meq | EXTENDED_RELEASE_TABLET | Freq: Every day | ORAL | Status: DC

## 2013-10-17 MED ORDER — SPIRONOLACTONE 25 MG PO TABS
25.0000 mg | ORAL_TABLET | Freq: Two times a day (BID) | ORAL | Status: DC
  Administered 2013-10-17 – 2013-10-25 (×16): 25 mg via ORAL
  Filled 2013-10-17 (×19): qty 1

## 2013-10-17 MED ORDER — FERROUS SULFATE 325 (65 FE) MG PO TABS
325.0000 mg | ORAL_TABLET | Freq: Every day | ORAL | Status: DC
  Administered 2013-10-18 – 2013-10-27 (×10): 325 mg via ORAL
  Filled 2013-10-17 (×12): qty 1

## 2013-10-17 MED ORDER — TRAMADOL HCL 50 MG PO TABS: 50.0000 mg | ORAL_TABLET | Freq: Two times a day (BID) | ORAL | Status: DC | PRN

## 2013-10-17 MED ORDER — TRAMADOL HCL 50 MG PO TABS
50.0000 mg | ORAL_TABLET | Freq: Two times a day (BID) | ORAL | Status: DC | PRN
  Administered 2013-10-17 – 2013-10-18 (×2): 100 mg via ORAL
  Administered 2013-10-25 (×2): 50 mg via ORAL
  Administered 2013-10-26: 100 mg via ORAL
  Filled 2013-10-17: qty 2
  Filled 2013-10-17: qty 1
  Filled 2013-10-17: qty 2
  Filled 2013-10-17: qty 1
  Filled 2013-10-17: qty 2

## 2013-10-17 MED ORDER — DOCUSATE SODIUM 100 MG PO CAPS
100.0000 mg | ORAL_CAPSULE | Freq: Every day | ORAL | Status: DC
  Administered 2013-10-18 – 2013-10-27 (×9): 100 mg via ORAL
  Filled 2013-10-17 (×10): qty 1

## 2013-10-17 MED ORDER — ALTEPLASE 100 MG IV SOLR: 2.0000 mg | Freq: Once | INTRAVENOUS | Status: DC

## 2013-10-17 MED ORDER — SILDENAFIL CITRATE 20 MG PO TABS
20.0000 mg | ORAL_TABLET | Freq: Three times a day (TID) | ORAL | Status: DC
  Administered 2013-10-17 – 2013-10-27 (×30): 20 mg via ORAL
  Filled 2013-10-17 (×32): qty 1

## 2013-10-17 MED ORDER — TAB-A-VITE/IRON PO TABS
1.0000 | ORAL_TABLET | Freq: Every day | ORAL | Status: DC
  Administered 2013-10-18 – 2013-10-27 (×10): 1 via ORAL
  Filled 2013-10-17 (×10): qty 1

## 2013-10-17 MED ORDER — WARFARIN SODIUM 5 MG PO TABS
5.0000 mg | ORAL_TABLET | Freq: Once | ORAL | Status: AC
  Administered 2013-10-17: 5 mg via ORAL
  Filled 2013-10-17: qty 1

## 2013-10-17 MED ORDER — PANTOPRAZOLE SODIUM 40 MG PO TBEC: 40.0000 mg | DELAYED_RELEASE_TABLET | Freq: Every day | ORAL | Status: DC

## 2013-10-17 MED ORDER — DOBUTAMINE IN D5W 4-5 MG/ML-% IV SOLN
5.0000 ug/kg/min | INTRAVENOUS | Status: DC
  Administered 2013-10-17 – 2013-10-26 (×5): 5 ug/kg/min via INTRAVENOUS
  Filled 2013-10-17 (×6): qty 250

## 2013-10-17 MED ORDER — ACETAMINOPHEN 325 MG PO TABS
650.0000 mg | ORAL_TABLET | Freq: Four times a day (QID) | ORAL | Status: DC | PRN
Start: 2013-10-17 — End: 2013-10-27
  Administered 2013-10-19 – 2013-10-22 (×3): 650 mg via ORAL
  Filled 2013-10-17 (×3): qty 2

## 2013-10-17 MED ORDER — ALTEPLASE 100 MG IV SOLR
2.0000 mg | Freq: Once | INTRAVENOUS | Status: AC
  Administered 2013-10-17: 2 mg
  Filled 2013-10-17 (×2): qty 2

## 2013-10-17 MED ORDER — BISACODYL 5 MG PO TBEC
5.0000 mg | DELAYED_RELEASE_TABLET | Freq: Every day | ORAL | Status: DC | PRN
  Administered 2013-10-19: 5 mg via ORAL
  Filled 2013-10-17: qty 1

## 2013-10-17 NOTE — Progress Notes (Signed)
ANTICOAGULATION CONSULT NOTE - Initial Consult  Pharmacy Consult for Warfarin Indication: LVAD  Allergies  Allergen Reactions  . Xopenex [Levalbuterol Hcl]     Rash in mouth    Patient Measurements: Height: 5\' 3"  (160 cm) Weight: 140 lb 14 oz (63.9 kg) IBW/kg (Calculated) : 52.4 Heparin Dosing Weight: n/q  Vital Signs: BP: 90/0 mmHg (06/17 1349) Pulse Rate: 82 (06/17 1349)  Labs:  Recent Labs  10/06/2013 1329  HGB 9.6*  HCT 29.0*  PLT 303  LABPROT 22.3*  INR 2.03*  CREATININE 2.10*    Estimated Creatinine Clearance: 24 ml/min (by C-G formula based on Cr of 2.1).   Medical History: Past Medical History  Diagnosis Date  . Paroxysmal supraventricular tachycardia   . Hypokalemia   . Chronic systolic heart failure     a. 10/784 Echo: EF 15%, mod to sev antlat and inflat HK, inf AK, mild to mod MR, mildly/mod reduced RV fxn, Mod TR, PASP .  [name] History of DVT (deep vein thrombosis)   . Dyslipidemia   . HTN (hypertension)   . Nonischemic cardiomyopathy     a. 06/2012 Echo: EF 15%;  b. 06/2012 Cath: nl except luminal irregs in LAD.  [name] Ventricular tachycardia     a. s/p ICD  . Goiter 2001  . Implantable cardiac defibrillator- Biotronik 2009    Single chamber  . [name] (left bundle branch block)     intermittent  . Palliative care patient     Medications:  Scheduled:  . alteplase  2 mg Intracatheter Once  . [START ON 10/18/2013] amLODipine  10 mg Oral Daily  . [START ON 10/18/2013] citalopram  20 mg Oral Daily  . [START ON 10/18/2013] docusate sodium  100 mg Oral Daily  . [START ON 10/18/2013] ferrous sulfate  325 mg Oral Q breakfast  . [START ON 10/18/2013] magnesium oxide  200 mg Oral Daily  . [START ON 10/18/2013] multivitamins with iron  1 tablet Oral Daily  . [START ON 10/18/2013] pantoprazole  40 mg Oral Daily  . [START ON 10/18/2013] potassium chloride  20 mEq Oral Daily  . sildenafil  20 mg Oral TID  . simvastatin  10 mg Oral q1800  . spironolactone  25 mg  Oral BID  . warfarin  5 mg Oral ONCE-1800  . Warfarin - Pharmacist Dosing Inpatient   Does not apply q1800    Assessment: 68 yo female on chronic Coumadin for LVAD admitted for profound fatigue and dyspnea.  INR is therapeutic today on home dose of Coumadin 2.5 mg daily except 5 mg on MWF.  No bleeding or complications noted.  Goal of Therapy:  INR 2-3 Monitor platelets by anticoagulation protocol: Yes   Plan:  1. Coumadin 5 mg po x 1 . 2. Daily PT/INR.  Tad Moore, BCPS  Clinical Pharmacist Pager [phone]  10/24/2013 5:54 PM

## 2013-10-17 NOTE — Progress Notes (Signed)
Symptom  Yes  No  Details   Angina        x Activity:   Claudication        x How far:   Syncope        x When:   Stroke        x   Orthopnea        x How many pillows:   1  PND        x How often:  CPAP      N/A How many hrs:   Pedal edema               x   Abd fullness               x    N&V        x   Diaphoresis        x When: waking up nightly with warm sweats (started in hospital)  Bleeding       x   Urine color          yellow  SOB         x       Activity:  Walking room to room  Palpitations              x When:    ICD shock               x palpitations occasionally  Hospitlizaitons        x       When/where/why: 6/10 - 10/14/13 recurrent VT/VF with ICD discharge  ED visit               x  When/where/why:    Other MD              x When/who/why:     Activity    Very limited due to fatigue and SOB  Fluid     @ 1 liter/day  Diet     Low sodium   Vital signs: HR:  82 MAP BP:  88; 90 O2 Sat:  96 Wt:  140.8  lbs Last wt: 136.6  lbs Ht: 5'3"  LVAD interrogation reveals:  Speed: 9200 Flow:  3.8 Power:  4.8 PI: 5.2 Alarms:  none Events:  5 - 10 PI events Fixed speed: 9200 Low speed limit:  8600  LVAD exit site:  Well healed and incorporated. The velour is fully implanted at exit site. Dressing dry and intact. No erythema or drainage. Pt does not wear stabilization device due to skin irritation. Stressed importance of keeping drive line secure and free from trauma to prevent damage/infections. Pt verbalized understanding of same. Driveline dressing is being changed weekly per sterile technique using. Pt using IV3000 tegaderm dressing from PICC line kit with less skin irritation/itching. Pt denies fever or chills.   Dressing change performed today using Sorbaview dressing with biopatch at exit site. No erythema, tenderness, drainage, or foul odor noted.   Pt/caregiver deny any alarms or VAD equipment issues. Pt is completing weekly and monthly maintenance for LVAD  equipment.  Pt is performing daily controller and system monitor self tests along with completing weekly and monthly maintenance for LVAD equipment. LVAD equipment check completed and is in good working order. Back-up equipment present. LVAD education done on emergency procedures and precautions and reviewed exit site care.   [employer] rep here to interrogate ICD; no VT episodes or therapies delivered since hospital discharge.  RV  paced 40%; VVI 60.  Carolynn Sayers from [place] attempted flushing/drawing back from PICC line; unable to flush dobutamine port; unable to draw back from either port. No alarms noted on Dobutamine pump. Dobutamine increased to 5 mcg/kg/min per Junie Bame, NP order.   VAD coordinator transported pt to [place] for admission with verbal report given; orders are being entered. Left message with pt's daughter re: hospital admission, room #, and contact information. Placed patient on power module with system monitor; black bag with back up controller, battery clips, and extra fully charged batteries at bedside.

## 2013-10-17 NOTE — H&P (Signed)
ADVANCED HF H&P  HPI:    Ms. Cease is a 68 y/o woman with h/o severe biventricular HF due to NICM s/p HM II LVAD implant (12/2012), VT s/ Biotronik ICD, CKD (Baseline Cr 1.5-1.6) and RHF. She is being admitted from HF Clinic on 6/17 for recurrent RHF.   8/18 Underwent HM II LVAD implant (under destination criteria) along with TV repair (tricuspid annuloplasty). Post-op course c/b RHF requiring take back to the OR for bleeding and temporay RVAD support. Subsequently started on dobutamine for RV support.   Admitted last week for recurrent VT in setting of hypokalemia. That hospitalization was complicated by recurrent RHF. Dobutamine increased to 4 mcg/kg/min. RAMP ECHO performed and speed turned down to 9200 to hopefully allow RV to keep up.  Returned to clinic today for evaluation. C/o profound fatigue and dyspnea on minimal exertion. Unable to complete ADLs. Denies ICD firing. No edema, orthopnea or PND. Weight stable.   LVAD interrogation reveals:  Speed: 9200  Flow: 3.8  Power: 4.8  PI: 5.2  Alarms: none  Events: 5 - 10 PI events  Fixed speed: 9200  Low speed limit: 8600    Review of Systems: [y] = yes, [ ]  = no   General: Weight gain [ ] ; Weight loss [ ] ; Anorexia [name] ]; Fatigue [name] ]; Fever [ ] ; Chills [ ] ; Weakness [ ]   Cardiac: Chest pain/pressure [ ] ; Resting SOB [ ] ; Exertional SOB [name] ]; Orthopnea [ ] ; Pedal Edema [ ] ; Palpitations [ ] ; Syncope [ ] ; Presyncope [ ] ; Paroxysmal nocturnal dyspnea[ ]   Pulmonary: Cough [ ] ; Wheezing[ ] ; Hemoptysis[ ] ; Sputum [ ] ; Snoring [ ]   GI: Vomiting[ ] ; Dysphagia[ ] ; Melena[ ] ; Hematochezia [ ] ; Heartburn[ ] ; Abdominal pain [ ] ; Constipation [ ] ; Diarrhea [ ] ; BRBPR [ ]   GU: Hematuria[ ] ; Dysuria [ ] ; Nocturia[ ]   Vascular: Pain in legs with walking [ ] ; Pain in feet with lying flat [ ] ; Non-healing sores [ ] ; Stroke [ ] ; TIA [ ] ; Slurred speech [ ] ;  Neuro: Headaches[ ] ; Vertigo[ ] ; Seizures[ ] ; Paresthesias[ ] ;Blurred vision [ ] ;  Diplopia [ ] ; Vision changes [ ]   Ortho/Skin: Arthritis [name] ]; Joint pain [ ] ; Muscle pain [ ] ; Joint swelling [ ] ; Back Pain [ ] ; Rash [ ]   Psych: Depression[y ]; Anxiety[ ]   Heme: Bleeding problems [ ] ; Clotting disorders [ ] ; Anemia [ ]   Endocrine: Diabetes [ ] ; Thyroid dysfunction[ ]   Home Medications Prior to Admission medications   Medication Sig Start Date End Date Taking? Authorizing Provider  acetaminophen (TYLENOL) 500 MG tablet Take 1,000 mg by mouth every 6 (six) hours as needed for mild pain.    Historical Provider, MD  amiodarone (PACERONE) 200 MG tablet Take 2 tablets (400 mg total) by mouth 2 (two) times daily. 10/14/13   Beatrice Lecher, PA-C  amLODipine (NORVASC) 10 MG tablet Take 1 tablet (10 mg total) by mouth daily. 09/05/13   Aundria Rud, NP  Artificial Tear Ointment (DRY EYES OP) Place 2 drops into both eyes daily as needed (dry eyes).    Historical Provider, MD  bisacodyl (DULCOLAX) 5 MG EC tablet Take 5 mg by mouth daily as needed for mild constipation.     Historical Provider, MD  citalopram (CELEXA) 20 MG tablet Take 1 tablet (20 mg total) by mouth daily. 10/14/13   Beatrice Lecher, PA-C  DOBUTamine (DOBUTREX) 4-5 MG/ML-% infusion Inject 234 mcg/min into the vein continuous. 10/14/13  Beatrice Lecher, PA-C  docusate sodium (COLACE) 100 MG capsule Take 100 mg by mouth daily.     Historical Provider, MD  ferrous sulfate 324 (65 FE) MG TBEC Take 1 tablet by mouth daily. 06/13/13   Dolores Patty, MD  Magnesium Oxide 200 MG TABS Take 1 tablet (200 mg total) by mouth daily. 10/14/13   Beatrice Lecher, PA-C  Multiple Vitamins-Minerals (MULTIVITAMIN WITH MINERALS) tablet Take 1 tablet by mouth daily.    Historical Provider, MD  pantoprazole (PROTONIX) 40 MG tablet Take 1 tablet (40 mg total) by mouth daily. 06/13/13   Laurey Morale, MD  potassium chloride (K-DUR,KLOR-CON) 10 MEQ tablet Take 2 tablets (20 mEq total) by mouth as needed (take only with Demadex (Torsemide)). 10/14/13    Beatrice Lecher, PA-C  sildenafil (REVATIO) 20 MG tablet Take 1 tablet (20 mg total) by mouth 3 (three) times daily. 08/02/13   Dolores Patty, MD  simvastatin (ZOCOR) 10 MG tablet Take 1 tablet (10 mg total) by mouth at bedtime. 06/13/13   Laurey Morale, MD  spironolactone (ALDACTONE) 25 MG tablet Take 1 tablet (25 mg total) by mouth 2 (two) times daily. 10/14/13   Beatrice Lecher, PA-C  torsemide (DEMADEX) 20 MG tablet Take 1 tablet (20 mg total) by mouth as needed (for weight 135 lbs or more). 10/14/13   Beatrice Lecher, PA-C  traMADol (ULTRAM) 50 MG tablet Take 1 tablet (50 mg total) by mouth every 8 (eight) hours as needed for moderate pain or severe pain. 10/14/13   Beatrice Lecher, PA-C  warfarin (COUMADIN) 5 MG tablet Take 2.5-5 mg by mouth every evening. Take 5mg  on Monday, Wednesday and Friday.  Take 2.5mg  all other days of the week    Historical Provider, MD    Past Medical History: Past Medical History  Diagnosis Date  . Paroxysmal supraventricular tachycardia   . Hypokalemia   . Chronic systolic heart failure     a. 01/8118 Echo: EF 15%, mod to sev antlat and inflat HK, inf AK, mild to mod MR, mildly/mod reduced RV fxn, Mod TR, PASP .  [name] History of DVT (deep vein thrombosis)   . Dyslipidemia   . HTN (hypertension)   . Nonischemic cardiomyopathy     a. 06/2012 Echo: EF 15%;  b. 06/2012 Cath: nl except luminal irregs in LAD.  [name] Ventricular tachycardia     a. s/p ICD  . Goiter 2001  . Implantable cardiac defibrillator- Biotronik 2009    Single chamber  . [name] (left bundle branch block)     intermittent  . Palliative care patient     Past Surgical History: Past Surgical History  Procedure Laterality Date  . None    . Cardiac defibrillator placement  01/2009  . Insertion of implantable left ventricular assist device N/A 12/18/2012    Procedure: INSERTION OF IMPLANTABLE LEFT VENTRICULAR ASSIST DEVICE;  Surgeon: Kerin Perna, MD;  Location: [facility] OR;  Service: Open Heart  Surgery;  Laterality: N/A;  . Intraoperative transesophageal echocardiogram N/A 12/18/2012    Procedure: INTRAOPERATIVE TRANSESOPHAGEAL ECHOCARDIOGRAM;  Surgeon: Kerin Perna, MD;  Location: [facility] OR;  Service: Open Heart Surgery;  Laterality: N/A;  . Tricuspid valve replacement N/A 12/18/2012    Procedure: TRICUSPID VALVE REPAIR;  Surgeon: Kerin Perna, MD;  Location: [facility] OR;  Service: Open Heart Surgery;  Laterality: N/A;  . Insertion of implantable left ventricular assist device N/A 12/18/2012    Procedure: INSERTION OF IMPLANTABLE  RIGHT VENTRICULAR ASSIST DEVICE;  Surgeon: Kerin PernaPeter [name], MD;  [facility];  Service: Open Heart Surgery;  Laterality: N/A;  . Removal of centrimag ventricular assist device N/A 12/25/2012    Procedure: REMOVAL OF CENTRIMAG VENTRICULAR ASSIST DEVICE;  Surgeon: Kerin PernaPeter [name], MD;  [facility];  Service: Open Heart Surgery;  Laterality: N/A;  PUMP STANDBY  . Intraoperative transesophageal echocardiogram N/A 12/25/2012    Procedure: INTRAOPERATIVE TRANSESOPHAGEAL ECHOCARDIOGRAM;  Surgeon: Kerin PernaPeter [name], MD;  [facility];  Service: Open Heart Surgery;  Laterality: N/A;  . Esophagogastroduodenoscopy N/A 12/26/2012    Procedure: ESOPHAGOGASTRODUODENOSCOPY (EGD);  Surgeon: Beverley FiedlerJay M Pyrtle, MD;  [facility];  Service: Gastroenterology;  Laterality: N/A;  Bedside  . Sternal closure N/A 12/27/2012    Procedure: STERNAL CLOSURE;  Surgeon: Kerin PernaPeter [name], MD;  [facility];  Service: Thoracic;  Laterality: N/A;  . Mediastinal exploration N/A 12/27/2012    Procedure: MEDIASTINAL EXPLORATION;  Surgeon: Kerin PernaPeter [name], MD;  [facility];  Service: Thoracic;  Laterality: N/A;    Family History: Family History  Problem Relation Age of Onset  . Emphysema Mother   . Heart disease Mother   . Heart attack Father   . Emphysema Brother     Social History: History   Social History  . Marital Status: Single    Spouse Name: N/A    Number of Children:  N/A  . Years of Education: N/A   Occupational History  . rservations Coordinator at [employer]    Social History Main Topics  . Smoking status: Never Smoker   . Smokeless tobacco: Never Used     Comment: Exposed to secondhand smoke from both parents as a child  . Alcohol Use: No  . Drug Use: No  . Sexual Activity: Not on file   Other Topics Concern  . 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 [place] in [place], not married. 2 daughters/           Allergies:  Allergies  Allergen Reactions  . Xopenex [Levalbuterol Hcl]     Rash in mouth    Objective:    Vital Signs:   Pulse Rate:  [82] 82 (06/17 1349) BP: (90)/(0) 90/0 mmHg (06/17 1349) SpO2:  [96 %] 96 % (06/17 1349) Weight:  [140 lb 12.8 oz (63.866 kg)-140 lb 14 oz (63.9 kg)] 140 lb 14 oz (63.9 kg) (06/17 1500)   Filed Weights   10/08/2013 1500  Weight: 140 lb 14 oz (63.9 kg)    Mean arterial Pressure 88  Physical Exam: General: Fatigued appearing. No resp difficulty, lying in bed  HEENT: normal,  Neck: supple. JVP 9-10 with prominent CV waves . Carotids 2+ bilat; no bruits. No lymphadenopathy or thryomegaly appreciated.  Cor: Mechanical heart sounds with LVAD hum present.  Lungs: clear  Abdomen: soft, nontender, nondistended. No hepatosplenomegaly. No bruits or masses. Good bowel sounds.  Driveline: C/D/I; securement device intact and driveline incorporated  Extremities: no cyanosis, clubbing, rash, edema, RUE 2L PICC  Neuro: alert & orientedx3, cranial nerves grossly intact. moves all 4 extremities w/o difficulty. Affect pleasant   Labs: Basic Metabolic Panel:  Recent Labs Lab 10/10/13 2017 10/11/13 0835 10/12/13 0500 10/13/13 0450 10/14/13 0613 10/11/2013 1329  NA 136* 140 138 136* 135* 137  K 2.7* 4.1 4.1 4.4 4.4 3.7  CL 90* 100 98 99 97 97  CO2 25 23 24 26 25  21  GLUCOSE 98 138* 99 87 87 118*  BUN 25* 26* 27* 26* 30* 41*  CREATININE 1.66* 1.66*  1.60* 1.36* 1.65* 2.10*  CALCIUM 10.4 10.2 10.1 10.2 9.8 10.1  MG 1.8 2.5  --   --  2.3  --     Liver Function Tests:  Recent Labs Lab 10/11/13 0835 10/12/13 0500 10/13/13 0450 10/14/13 0613 10/31/2013 1329  AST 22 23 21 20  32  ALT 23 21 19 19 29   ALKPHOS 188* 175* 170* 171* 178*  BILITOT 2.1* 1.6* 1.7* 1.7* 2.6*  PROT 7.4 7.3 7.0 7.1 7.4  ALBUMIN 3.9 3.8 3.8 3.8 4.2   No results found for this basename: LIPASE, AMYLASE,  in the last 168 hours No results found for this basename: AMMONIA,  in the last 168 hours  CBC:  Recent Labs Lab 10/10/13 2017 10/31/2013 1329  WBC 12.7* 9.9  HGB 10.5* 9.6*  HCT 32.5* 29.0*  MCV 87.6 88.4  PLT 334 303    Cardiac Enzymes:  Recent Labs Lab 10/10/13 2001  TROPONINI <0.30    BNP: BNP (last 3 results)  Recent Labs  10/03/13 1306 10/10/13 2001 2013/10/31 1329  PROBNP 1462.0* 1759.0* 3826.0*    CBG: No results found for this basename: GLUCAP,  in the last 168 hours  Coagulation Studies:  Recent Labs  10-31-2013 1329  LABPROT 22.3*  INR 2.03*    Other results: EKG: pending  Imaging:  No results found.      Assessment:   1. Right heart failure 2. Chronic Systolic HF s/p HMII VAD placement  3. Acute on chronic renal failure, stage III-IV 4. Restrictive lung disease  5. H/o VT   Plan/Discussion:    Ms. Matchett is suffering from worsening right heart failure, now with Class IV symptoms despite dobutamine support. Will increase dobutamine to 5. Given renal failure will hold diuretics for now. I am very concerned about her prognosis.   I reviewed the LVAD parameters from today, and compared the results to the patient's prior recorded data.  No programming changes were made.  The LVAD is functioning within specified parameters.  The patient performs LVAD self-test daily.  LVAD interrogation was negative for any significant power changes, alarms or PI events/speed drops.  LVAD equipment check completed and is in  good working order.  Back-up equipment present.   LVAD education done on emergency procedures and precautions and reviewed exit site care.  Length of Stay: 0  Arvilla Meres MD 2013-10-31, 5:21 PM  VAD Team Pager [phone] (7am - 7am) +++VAD ISSUES ONLY+++   Advanced Heart Failure Team Pager [phone] (M-F; 7a - 4p)  Please contact [facility] for night-coverage after hours (4p -7a ) and weekends on [email or web address] for all non- LVAD Issues

## 2013-10-18 LAB — CARBOXYHEMOGLOBIN
CARBOXYHEMOGLOBIN: 1 % (ref 0.5–1.5)
Carboxyhemoglobin: 1.2 % (ref 0.5–1.5)
Methemoglobin: 1.1 % (ref 0.0–1.5)
Methemoglobin: 1 % (ref 0.0–1.5)
O2 SAT: 44.4 %
O2 SAT: 37.1 %
Total hemoglobin: 9.6 g/dL — ABNORMAL LOW (ref 12.0–16.0)
Total hemoglobin: 9.5 g/dL — ABNORMAL LOW (ref 12.0–16.0)

## 2013-10-18 LAB — BASIC METABOLIC PANEL
BUN: 38 mg/dL — ABNORMAL HIGH (ref 6–23)
CO2: 24 mEq/L (ref 19–32)
CREATININE: 1.71 mg/dL — AB (ref 0.50–1.10)
Calcium: 10.1 mg/dL (ref 8.4–10.5)
Chloride: 94 mEq/L — ABNORMAL LOW (ref 96–112)
GFR calc non Af Amer: 30 mL/min — ABNORMAL LOW (ref >90)
GFR, EST AFRICAN AMERICAN: 35 mL/min — AB (ref >90)
Glucose, Bld: 103 mg/dL — ABNORMAL HIGH (ref 70–99)
Potassium: 3.5 mEq/L — ABNORMAL LOW (ref 3.7–5.3)
Sodium: 134 mEq/L — ABNORMAL LOW (ref 137–147)

## 2013-10-18 LAB — PROTIME-INR
INR: 2.82 — AB (ref 0.00–1.49)
PROTHROMBIN TIME: 28.7 s — AB (ref 11.6–15.2)

## 2013-10-18 MED ORDER — WARFARIN SODIUM 1 MG PO TABS
1.0000 mg | ORAL_TABLET | Freq: Once | ORAL | Status: AC
  Administered 2013-10-18: 1 mg via ORAL
  Filled 2013-10-18: qty 1

## 2013-10-18 MED ORDER — FUROSEMIDE 10 MG/ML IJ SOLN
40.0000 mg | Freq: Once | INTRAMUSCULAR | Status: AC
  Administered 2013-10-18: 40 mg via INTRAVENOUS
  Filled 2013-10-18: qty 4

## 2013-10-18 MED ORDER — POTASSIUM CHLORIDE CRYS ER 20 MEQ PO TBCR
40.0000 meq | EXTENDED_RELEASE_TABLET | Freq: Once | ORAL | Status: AC
  Administered 2013-10-18: 40 meq via ORAL
  Filled 2013-10-18: qty 2

## 2013-10-18 NOTE — Progress Notes (Signed)
Advanced Heart Failure Rounding Note   Subjective:    Kaitlin [name] is a 68 y/o woman with h/o severe biventricular HF due to NICM s/p HM II LVAD implant (12/2012), VT s/ Biotronik ICD, CKD (Baseline Cr 1.5-1.6) and RHF. She is being admitted from HF Clinic on 6/17 for recurrent RHF.  8/18 Underwent HM II LVAD implant (under destination criteria) along with TV repair (tricuspid annuloplasty). Post-op course c/b RHF requiring take back to the OR for bleeding and temporay RVAD support. Subsequently started on dobutamine for RV support.   Admitted last week for recurrent VT in setting of hypokalemia. That hospitalization was complicated by recurrent RHF. Dobutamine increased to 4 mcg/kg/min. RAMP ECHO performed and speed turned down to 9200 to hopefully allow RV to keep up.   Yesterday she was admitted from HF clinic with fatigue and dyspnea on minimal exertion. Dobutamine was increased to 5 mcg and diuretics held due to worsening renal functions. Remains very fatigued. Denies dyspnea or orthopnea. Not sleeping well.   CO-OX 44%> 37%   Creatinine 2.1> 1.7 off diuretics.    LVAD interrogation reveals:  Speed: 9200  Flow: 3.8  Power: 4.8  PI: 5.8  Alarms: none  Events: 5 - 10 PI events  Fixed speed: 9200  Low speed limit: 8600     Objective:   Weight Range:  Vital Signs:   Temp:  [98.4 F (36.9 C)-98.8 F (37.1 C)] 98.7 F (37.1 C) (06/18 0400) Pulse Rate:  [77-86] 77 (06/17 1900) Resp:  [16-28] 21 (06/18 0000) BP: (80-110)/(0) 80/0 mmHg (06/18 0400) SpO2:  [92 %-96 %] 95 % (06/17 2000) Weight:  [128 lb 4.9 oz (58.2 kg)-140 lb 14 oz (63.9 kg)] 128 lb 4.9 oz (58.2 kg) (06/18 0500)    Weight change: Filed Weights   10-28-13 1500 10/18/13 0500  Weight: 140 lb 14 oz (63.9 kg) 128 lb 4.9 oz (58.2 kg)    Intake/Output:   Intake/Output Summary (Last 24 hours) at 10/18/13 0659 Last data filed at 10/18/13 0600  Gross per 24 hour  Intake  304.8 ml  Output      0 ml  Net  304.8  ml     Physical Exam: CVP 20-22 General: Fatigued appearing. No resp difficulty, lying in bed  HEENT: normal,  Neck: supple. JVP ear with prominent CV waves . Carotids 2+ bilat; no bruits. No lymphadenopathy or thryomegaly appreciated.  Cor: Mechanical heart sounds with LVAD hum present.  Lungs: clear  Abdomen: soft, nontender, nondistended. No hepatosplenomegaly. No bruits or masses. Good bowel sounds.  Driveline: C/D/I; securement device intact and driveline incorporated  Extremities: no cyanosis, clubbing, rash, edema, RUE 2L PICC  Neuro: alert & orientedx3, cranial nerves grossly intact. moves all 4 extremities w/o difficulty. Affect pleasant     Telemetry: SR with PVC  Labs: Basic Metabolic Panel:  Recent Labs Lab 10/11/13 0835 10/12/13 0500 10/13/13 0450 10/14/13 0613 2013/10/28 1329 10/18/13 0435  NA 140 138 136* 135* 137 134*  K 4.1 4.1 4.4 4.4 3.7 3.5*  CL 100 98 99 97 97 94*  CO2 23 24 26 25 21 24   GLUCOSE 138* 99 87 87 118* 103*  BUN 26* 27* 26* 30* 41* 38*  CREATININE 1.66* 1.60* 1.36* 1.65* 2.10* 1.71*  CALCIUM 10.2 10.1 10.2 9.8 10.1 10.1  MG 2.5  --   --  2.3  --   --     Liver Function Tests:  Recent Labs Lab 10/11/13 0835 10/12/13 0500 10/13/13 0450 10/14/13  [id number] 10/03/2013 1329  AST 22 23 21 20  32  ALT 23 21 19 19 29   ALKPHOS 188* 175* 170* 171* 178*  BILITOT 2.1* 1.6* 1.7* 1.7* 2.6*  PROT 7.4 7.3 7.0 7.1 7.4  ALBUMIN 3.9 3.8 3.8 3.8 4.2   No results found for this basename: LIPASE, AMYLASE,  in the last 168 hours No results found for this basename: AMMONIA,  in the last 168 hours  CBC:  Recent Labs Lab 10/24/2013 1329  WBC 9.9  HGB 9.6*  HCT 29.0*  MCV 88.4  PLT 303    Cardiac Enzymes: No results found for this basename: CKTOTAL, CKMB, CKMBINDEX, TROPONINI,  in the last 168 hours  BNP: BNP (last 3 results)  Recent Labs  10/03/13 1306 10/10/13 2001 10/13/2013 1329  PROBNP 1462.0* 1759.0* 3826.0*     Other  results:  EKG:   Imaging:  No results found.   Medications:     Scheduled Medications: . amiodarone  200 mg Oral BID  . amLODipine  10 mg Oral Daily  . citalopram  20 mg Oral Daily  . docusate sodium  100 mg Oral Daily  . ferrous sulfate  325 mg Oral Q breakfast  . magnesium oxide  200 mg Oral Daily  . multivitamins with iron  1 tablet Oral Daily  . pantoprazole  40 mg Oral Daily  . potassium chloride  20 mEq Oral Daily  . sildenafil  20 mg Oral TID  . simvastatin  10 mg Oral q1800  . spironolactone  25 mg Oral BID  . Warfarin - Pharmacist Dosing Inpatient   Does not apply q1800     Infusions: . DOBUTamine 5 mcg/kg/min (10/26/2013 1821)     PRN Medications:  acetaminophen, bisacodyl, hydrALAZINE, traMADol, zolpidem   Assessment:   1. 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 Chronic Systolic HF s/p HMII VAD placement  3. Acute on chronic renal failure, stage III-IV  4. Restrictive lung disease  5. H/o VT   Plan/Discussion:    Length of Stay: 1 [name],[name] 10/18/2013, 6:59 AM  Advanced Heart Failure Team Pager [phone] (M-F; 7a - 4p)  Please contact [facility] for night-coverage after hours (4p -7a ) and weekends on [email or web address]  Patient seen and examined with Tonye BecketAmy [name], NP. We discussed all aspects of the encounter. I agree with the assessment and plan as stated above.   She has end-stage right heart failure with low cardiac output and markedly elevated CVP despite dobutamine support. Previously did not tolerate milrinone. Options are extremely limited. Will turn VAD speed down to 9000 and try to diurese gently to see if we can optimize her any. I suspect we are nearing a palliative care situation as she has been deemed not to be a transplant candidate due to severe restrictive lung disease.  Daniel Bensimhon,MD 9:21 AM

## 2013-10-18 NOTE — Progress Notes (Signed)
Utilization review completed. Bertha Stanfill, RN, BSN. 

## 2013-10-18 NOTE — Progress Notes (Signed)
LVAD NP Molly notified of pt requiring oxygen to maintain  O2 sats greater than 92.  Also notified of pt having dyspnea with exertion and elevated blood pressure with exertion.  Blood pressure returns to baseline after 30-45 minutes of rest. Will continue to monitor pt closely. No orders received at this time.

## 2013-10-18 NOTE — Progress Notes (Signed)
ANTICOAGULATION CONSULT NOTE  Pharmacy Consult for Warfarin Indication: LVAD  Allergies  Allergen Reactions  . Xopenex [Levalbuterol Hcl]     Rash in mouth    Patient Measurements: Height: 5\' 3"  (160 cm) Weight: 128 lb 4.9 oz (58.2 kg) IBW/kg (Calculated) : 52.4   Vital Signs: Temp: 98.7 F (37.1 C) (06/18 0400) Temp src: Oral (06/18 0400) BP: 80/0 mmHg (06/18 0400)  Labs:  Recent Labs  10/09/2013 1329 10/18/13 0435  HGB 9.6*  --   HCT 29.0*  --   PLT 303  --   LABPROT 22.3* 28.7*  INR 2.03* 2.82*  CREATININE 2.10* 1.71*    Estimated Creatinine Clearance: 27.1 ml/min (by C-G formula based on Cr of 1.71).   Assessment: 68 yo female on chronic Coumadin for LVAD admitted for profound fatigue and dyspnea. INR is therapeutic today with large increase overnight 2.0>>2.8. Will give low dose tonight.   Home dose of Coumadin 2.5 mg daily except 5 mg on MWF. No bleeding or complications noted.  Patient admitted for increased fatigue and dyspnea - home dobutamine gtt was increased to .  Goal of Therapy:  INR 2-3 Monitor platelets by anticoagulation protocol: Yes   Plan:  1. Coumadin 1 mg tonight 2. Daily PT/INR.  Sheppard Coil PharmD., BCPS Clinical Pharmacist Pager [phone] 10/18/2013 7:37 AM

## 2013-10-19 LAB — BASIC METABOLIC PANEL
BUN: 35 mg/dL — AB (ref 6–23)
CALCIUM: 9.9 mg/dL (ref 8.4–10.5)
CO2: 24 mEq/L (ref 19–32)
Chloride: 95 mEq/L — ABNORMAL LOW (ref 96–112)
Creatinine, Ser: 1.52 mg/dL — ABNORMAL HIGH (ref 0.50–1.10)
GFR calc Af Amer: 40 mL/min — ABNORMAL LOW (ref >90)
GFR, EST NON AFRICAN AMERICAN: 35 mL/min — AB (ref >90)
GLUCOSE: 116 mg/dL — AB (ref 70–99)
Potassium: 4.5 mEq/L (ref 3.7–5.3)
SODIUM: 133 meq/L — AB (ref 137–147)

## 2013-10-19 LAB — CARBOXYHEMOGLOBIN
Carboxyhemoglobin: 1 % (ref 0.5–1.5)
Methemoglobin: 1 % (ref 0.0–1.5)
O2 Saturation: 53.5 %
TOTAL HEMOGLOBIN: 10.3 g/dL — AB (ref 12.0–16.0)

## 2013-10-19 LAB — PROTIME-INR
INR: 3.7 — ABNORMAL HIGH (ref 0.00–1.49)
Prothrombin Time: 35.3 seconds — ABNORMAL HIGH (ref 11.6–15.2)

## 2013-10-19 MED ORDER — TORSEMIDE 20 MG PO TABS
20.0000 mg | ORAL_TABLET | Freq: Two times a day (BID) | ORAL | Status: DC
  Administered 2013-10-19 – 2013-10-25 (×13): 20 mg via ORAL
  Filled 2013-10-19 (×17): qty 1

## 2013-10-19 NOTE — Evaluation (Signed)
Physical Therapy Evaluation Patient Details Name: Kaitlin [name] MRN: [id number] DOB: July 13, 1945 Today's Date: 10/19/2013   History of Present Illness  Pt is a 68 y/o female with h/o severe biventricular HF due to NICM s/p HM II LVAD implant (12/2012), VT s/ Biotronik ICD, CKD (Baseline Cr 1.5-1.6) and RHF. She is being admitted from HF Clinic on 6/17 for recurrent RHF.   Clinical Impression  Pt admitted with the above. Pt currently with functional limitations due to the deficits listed below (see PT Problem List). At the time of PT eval pt was able to perform transfers with min guard. Session limited by LVAD machine.  Pt will benefit from skilled PT to increase their independence and safety with mobility to allow discharge to the venue listed below.     Follow Up Recommendations No PT follow up;Supervision - Intermittent    Equipment Recommendations  None recommended by PT    Recommendations for Other Services       Precautions / Restrictions Precautions Precautions: Fall;Other (comment) (LVAD) Restrictions Weight Bearing Restrictions: No      Mobility  Bed Mobility Overal bed mobility: Needs Assistance Bed Mobility: Supine to Sit     Supine to sit: Supervision     General bed mobility comments: Increased time required to transition to EOB. Heavy use of bed rails required.   Transfers Overall transfer level: Needs assistance Equipment used: 1 person hand held assist Transfers: Sit to/from Stand Sit to Stand: Min guard         General transfer comment: Pt able to power-up to standing with no physical assist required.   Ambulation/Gait Ambulation/Gait assistance: Min guard Ambulation Distance (Feet): 4 Feet Assistive device: 1 person hand held assist Gait Pattern/deviations: Step-through pattern;Decreased stride length;Trunk flexed Gait velocity: Decreased Gait velocity interpretation: Below normal speed for age/gender General Gait Details: Pt was able to  take a few steps between the bed and the chair to transfer. No LOB noted.   Stairs            Wheelchair Mobility    Modified Rankin (Stroke Patients Only)       Balance Overall balance assessment: History of Falls                                           Pertinent Vitals/Pain Vitals stable throughout session. Pt on RA and sats remained >93%.     Home Living Family/patient expects to be discharged to:: Private residence Living Arrangements: Alone Available Help at Discharge: Available PRN/intermittently Type of Home: House Home Access: Stairs to enter Entrance Stairs-Rails: Right;Left;Can reach both Entrance Stairs-Number of Steps: 5 Home Layout: One level Home Equipment: Environmental consultantWalker - 4 wheels      Prior Function Level of Independence: Independent         Comments: Still driving, daughter assists with grocery shopping     Hand Dominance   Dominant Hand: Right    Extremity/Trunk Assessment   Upper Extremity Assessment: Overall WFL for tasks assessed           Lower Extremity Assessment: Overall WFL for tasks assessed      Cervical / Trunk Assessment: Normal  Communication   Communication: No difficulties  Cognition Arousal/Alertness: Lethargic;Suspect due to medications [facility] assessed/performed Overall Cognitive Status: Within Functional Limits for tasks assessed  General Comments      Exercises        Assessment/Plan    PT Assessment Patient needs continued PT services  PT Diagnosis Difficulty walking   PT Problem List Decreased strength;Decreased range of motion;Decreased activity tolerance;Decreased balance;Decreased mobility;Decreased knowledge of use of DME;Decreased safety awareness;Decreased knowledge of precautions;Cardiopulmonary status limiting activity  PT Treatment Interventions DME instruction;Gait training;Stair training;Functional mobility  training;Therapeutic activities;Therapeutic exercise;Neuromuscular re-education;Patient/family education   PT Goals (Current goals can be found in the Care Plan section) Acute Rehab PT Goals Patient Stated Goal: To return home alone and begin knitting.  PT Goal Formulation: With patient Time For Goal Achievement: 10/26/13 Potential to Achieve Goals: Good    Frequency Min 3X/week   Barriers to discharge Decreased caregiver support Pt states she prefers to be alone at home. Declining any PT follow up.     Co-evaluation               End of Session Equipment Utilized During Treatment: Oxygen Activity Tolerance: Patient tolerated treatment well Patient left: in chair;with call bell/phone within reach Nurse Communication: Mobility status         Time: 1000-1022 PT Time Calculation (min): 22 min   Charges:   PT Evaluation $Initial PT Evaluation Tier I: 1 Procedure PT Treatments $Therapeutic Activity: 8-22 mins   PT G Codes:          Ruthann Cancer 10/19/2013, 1:07 PM  Ruthann Cancer, PT, DPT Acute Rehabilitation Services Pager: [phone]

## 2013-10-19 NOTE — Progress Notes (Signed)
ANTICOAGULATION CONSULT NOTE  Pharmacy Consult for Warfarin Indication: LVAD  Allergies  Allergen Reactions  . Xopenex [Levalbuterol Hcl]     Rash in mouth    Patient Measurements: Height: 5\' 3"  (160 cm) Weight: 129 lb 10.1 oz (58.8 kg) IBW/kg (Calculated) : 52.4   Vital Signs: Temp: 98.9 F (37.2 C) (06/19 0748) Temp src: Oral (06/19 0748) BP: 102/0 mmHg (06/19 0748) Pulse Rate: 92 (06/19 0700)  Labs:  Recent Labs  10/13/2013 1329 10/18/13 0435 10/19/13 0355  HGB 9.6*  --   --   HCT 29.0*  --   --   PLT 303  --   --   LABPROT 22.3* 28.7* 35.3*  INR 2.03* 2.82* 3.70*  CREATININE 2.10* 1.71* 1.52*    Estimated Creatinine Clearance: 30.5 ml/min (by C-G formula based on Cr of 1.52).   Assessment: 68 yo female on chronic Coumadin for LVAD admitted for profound fatigue and dyspnea. INR is now supratherapeutic today with continued large increases overnight 2.0>>2.8>>3.7. Will hold dose tonight.   Home dose of Coumadin 2.5 mg daily except 5 mg on MWF. No bleeding or complications noted.  Patient admitted for increased fatigue and dyspnea - home dobutamine gtt was increased to .  Goal of Therapy:  INR 2-3 Monitor platelets by anticoagulation protocol: Yes   Plan:  1. Hold Coumadin today 2. Daily PT/INR.  Sheppard Coil PharmD., BCPS Clinical Pharmacist Pager [phone] 10/19/2013 8:02 AM

## 2013-10-19 NOTE — Progress Notes (Signed)
CARDIAC REHAB PHASE I   PRE:  Rate/Rhythm: 60 paced  BP:  Supine:   Sitting: 86 map  Standing:    SaO2: 97% 1.5 L  MODE:  Ambulation: 130 ft   POST:  Rate/Rhythm: 60-61 paced  BP:  Supine:   Sitting: 90 map  Standing:    SaO2: 92-92% 2L 1400-1448 Assisted pt with equipment as she gets very tired switching to batteries from power source and vice versa. Walked 130 ft on 2L with rolling walker and asst x 2 due to equipment. Back up bag taken with [place]. Stopped several times to rest . C/o feeling thirsty during walk. Some DOE noted. Checked sats once during walk at 92-93%. Back to recliner and drink given. Back to power source. Call light in reach. Generalized weakness.   Luetta Nutting, RN BSN  10/19/2013 2:42 PM

## 2013-10-19 NOTE — Progress Notes (Signed)
Advanced Heart Failure Rounding Note   Subjective:    Kaitlin [name] is a 68 y/o woman with h/o severe biventricular HF due to NICM s/p HM II LVAD implant (12/2012), VT s/ Biotronik ICD, CKD (Baseline Cr 1.5-1.6) and RHF. She is being admitted from HF Clinic on 6/17 for recurrent RHF.  8/18 Underwent HM II LVAD implant (under destination criteria) along with TV repair (tricuspid annuloplasty). Post-op course c/b RHF requiring take back to the OR for bleeding and temporay RVAD support. Subsequently started on dobutamine for RV support.   Admitted in early June for recurrent VT in setting of hypokalemia which was complicated by  recurrent RHF. Dobutamine increased to 4 mcg/kg/min. RAMP ECHO performed and speed turned down to 9200 to hopefully allow RV to keep up.   Admitted from HF clinic with fatigue and dyspnea on minimal exertion. Dobutamine was increased to 5 mcg and diuretics held due to worsening renal functions. Yesterday VAD speed turned down to 9000 due RHF.   Feels better this am. No dyspnea. Slept well.     CO-OX 44%> 37>53%   Creatinine 2.1> 1.7>1.5     LVAD interrogation reveals:  Speed: 9000  Flow: 3.2 Power: 4.4 PI: 5.7 Alarms: none  Events: 5  PI events  Fixed speed: 9000  Low speed limit: 8400     Objective:   Weight Range:  Vital Signs:   Temp:  [97.9 F (36.6 C)-98.7 F (37.1 C)] 98.3 F (36.8 C) (06/19 0000) Pulse Rate:  [60-80] 60 (06/19 0000) Resp:  [15-28] 20 (06/19 0000) BP: (62-112)/(0) 100/0 mmHg (06/19 0400) SpO2:  [91 %-96 %] 93 % (06/19 0000) Weight:  [129 lb 10.1 oz (58.8 kg)] 129 lb 10.1 oz (58.8 kg) (06/19 0457) Last BM Date: 10/16/13  Weight change: Filed Weights   10/25/2013 1500 10/18/13 0500 10/19/13 0457  Weight: 140 lb 14 oz (63.9 kg) 128 lb 4.9 oz (58.2 kg) 129 lb 10.1 oz (58.8 kg)    Intake/Output:   Intake/Output Summary (Last 24 hours) at 10/19/13 0701 Last data filed at 10/19/13 0600  Gross per 24 hour  Intake 1550.4 ml   Output    900 ml  Net  650.4 ml     Physical Exam: CVP 25 with v waves in CVP tracing to 35 General: No resp difficulty, lying flat  in bed  HEENT: normal,  Neck: supple. JVP ear with prominent CV waves . Carotids 2+ bilat; no bruits. No lymphadenopathy or thryomegaly appreciated.  Cor: Mechanical heart sounds with LVAD hum present.  Lungs: clear  Abdomen: soft, nontender, nondistended. No hepatosplenomegaly. No bruits or masses. Good bowel sounds.  Driveline: C/D/I; securement device intact and driveline incorporated  Extremities: no cyanosis, clubbing, rash, edema, RUE 2L PICC  Neuro: alert & orientedx3, cranial nerves grossly intact. moves all 4 extremities w/o difficulty. Affect pleasant     Telemetry: SR with PVC  Labs: Basic Metabolic Panel:  Recent Labs Lab 10/13/13 0450 10/14/13 0613 10/28/2013 1329 10/18/13 0435 10/19/13 0355  NA 136* 135* 137 134* 133*  K 4.4 4.4 3.7 3.5* 4.5  CL 99 97 97 94* 95*  CO2 26 25 21 24 24   GLUCOSE 87 87 118* 103* 116*  BUN 26* 30* 41* 38* 35*  CREATININE 1.36* 1.65* 2.10* 1.71* 1.52*  CALCIUM 10.2 9.8 10.1 10.1 9.9  MG  --  2.3  --   --   --     Liver Function Tests:  Recent Labs Lab 10/13/13 0450 10/14/13 [id number]  10/15/2013 1329  AST 21 20 32  ALT 19 19 29   ALKPHOS 170* 171* 178*  BILITOT 1.7* 1.7* 2.6*  PROT 7.0 7.1 7.4  ALBUMIN 3.8 3.8 4.2   No results found for this basename: LIPASE, AMYLASE,  in the last 168 hours No results found for this basename: AMMONIA,  in the last 168 hours  CBC:  Recent Labs Lab 10/20/2013 1329  WBC 9.9  HGB 9.6*  HCT 29.0*  MCV 88.4  PLT 303    Cardiac Enzymes: No results found for this basename: CKTOTAL, CKMB, CKMBINDEX, TROPONINI,  in the last 168 hours  BNP: BNP (last 3 results)  Recent Labs  10/03/13 1306 10/10/13 2001 10/10/2013 1329  PROBNP 1462.0* 1759.0* 3826.0*     Other results:    Imaging: No results found.   Medications:     Scheduled Medications: .  amiodarone  200 mg Oral BID  . amLODipine  10 mg Oral Daily  . citalopram  20 mg Oral Daily  . docusate sodium  100 mg Oral Daily  . ferrous sulfate  325 mg Oral Q breakfast  . magnesium oxide  200 mg Oral Daily  . multivitamins with iron  1 tablet Oral Daily  . pantoprazole  40 mg Oral Daily  . potassium chloride  20 mEq Oral Daily  . sildenafil  20 mg Oral TID  . simvastatin  10 mg Oral q1800  . spironolactone  25 mg Oral BID  . Warfarin - Pharmacist Dosing Inpatient   Does not apply q1800    Infusions: . DOBUTamine 5 mcg/kg/min (10/18/13 0800)    PRN Medications: acetaminophen, bisacodyl, hydrALAZINE, traMADol, zolpidem   Assessment:   1. 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 Chronic Systolic HF s/p HMII VAD placement  3. Acute on chronic renal failure, stage III-IV  4. Restrictive lung disease  5. H/o VT   Plan/Discussion:    Length of Stay: 2 [name],[name] NP-C 10/19/2013, 7:01 AM  Advanced Heart Failure Team Pager [phone] (M-F; 7a - 4p)  Please contact [facility] for night-coverage after hours (4p -7a ) and weekends  Patient seen and examined with Tonye Becket, NP. We discussed all aspects of the encounter. I agree with the assessment and plan as stated above.   She continues to struggle with profound RHF. Co-ox up slightly on higher dose dobutamine and decreased VAD speed. Will resume demadex at 20 bid and try to get CVP down as tolerated. May need to decrease VAD speed further if co-ox remains low. Cardia rehab to see. May be getting close to a palliative situation.   Daniel Bensimhon,MD 7:23 AM

## 2013-10-20 LAB — PROTIME-INR
INR: 3.92 — ABNORMAL HIGH (ref 0.00–1.49)
Prothrombin Time: 36.9 seconds — ABNORMAL HIGH (ref 11.6–15.2)

## 2013-10-20 LAB — CARBOXYHEMOGLOBIN
CARBOXYHEMOGLOBIN: 0.8 % (ref 0.5–1.5)
Carboxyhemoglobin: 0.9 % (ref 0.5–1.5)
Carboxyhemoglobin: 0.6 % (ref 0.5–1.5)
METHEMOGLOBIN: 0.9 % (ref 0.0–1.5)
Methemoglobin: 0.9 % (ref 0.0–1.5)
Methemoglobin: 1.3 % (ref 0.0–1.5)
O2 SAT: 33.1 %
O2 Saturation: 37.9 %
O2 Saturation: 57.4 %
TOTAL HEMOGLOBIN: 9.6 g/dL — AB (ref 12.0–16.0)
Total hemoglobin: 9.5 g/dL — ABNORMAL LOW (ref 12.0–16.0)
Total hemoglobin: 8.9 g/dL — ABNORMAL LOW (ref 12.0–16.0)

## 2013-10-20 LAB — BASIC METABOLIC PANEL
BUN: 37 mg/dL — AB (ref 6–23)
CHLORIDE: 96 meq/L (ref 96–112)
CO2: 25 meq/L (ref 19–32)
Calcium: 9.8 mg/dL (ref 8.4–10.5)
Creatinine, Ser: 1.76 mg/dL — ABNORMAL HIGH (ref 0.50–1.10)
GFR calc Af Amer: 34 mL/min — ABNORMAL LOW (ref >90)
GFR calc non Af Amer: 29 mL/min — ABNORMAL LOW (ref >90)
GLUCOSE: 113 mg/dL — AB (ref 70–99)
POTASSIUM: 4.4 meq/L (ref 3.7–5.3)
Sodium: 135 mEq/L — ABNORMAL LOW (ref 137–147)

## 2013-10-20 NOTE — Progress Notes (Signed)
CARDIAC REHAB PHASE I   PRE:  Rate/Rhythm: Paced 63 BP:  Supine:   Sitting: 98 dopper  Standing:    SaO2: 88-89 RA,  2lnc applied 90-91% ambulated on 3lnc 94%  MODE:  Ambulation: 170 ft   POST:  Rate/Rhythm: Paced 64  BP:  Supine:   Sitting: 100  Standing:    SaO2: 91 2lnc  Pt assisted with switching one cord from the power source but needed assistance with the second cord.  Pt tired easily with transferring cords from power unit to portable.  Backup battery bag accompanied walk for 170 feet with much encouragement x 2 assist.  Pt with multiple standing and two sitting rest breaks.  Pt staggers some when legs weaken. Needs assistance with steering walker around turns.  Pt complains of weakness in legs especially with the left knee - bruising noted from previous fall.  Pt requested water x 2 during the ambulation. Pt back to bed per request.  Pt able to switch both cords back easily.  Call bell in place, denies any other needs.  Alanson Aly, BSN [phone]

## 2013-10-20 NOTE — Progress Notes (Addendum)
Advanced Heart Failure Rounding Note   Subjective:    [name] is a 68 y/o woman with h/o severe biventricular HF due to NICM s/p HM II LVAD implant (12/2012), VT s/ Biotronik ICD, CKD (Baseline Cr 1.5-1.6) and RHF. She is being admitted from HF Clinic on 6/17 for recurrent RHF.  8/18 Underwent HM II LVAD implant (under destination criteria) along with TV repair (tricuspid annuloplasty). Post-op course c/b RHF requiring take back to the OR for bleeding and temporay RVAD support. Subsequently started on dobutamine for RV support.   Admitted in early June for recurrent VT in setting of hypokalemia which was complicated by  recurrent RHF. Dobutamine increased to 4 mcg/kg/min. RAMP ECHO performed and speed turned down to 9200 to hopefully allow RV to keep up.   Admitted from HF clinic with fatigue and dyspnea on minimal exertion. Dobutamine was increased to 5 mcg and diuretics held due to worsening renal functions.On admit VAD speed turned down to 9000 due RHF.   Yesterday started back on torsemide for CVP 25. Feels better this am. No dyspnea. Slept well. Co-ox worse.     CO-OX 44%> 37>53%  >28% Creatinine 2.1> 1.7>1.5  >1.8 INR 3.9   LVAD interrogation reveals:  Speed: 9000  Flow: 3.7 Power: 4.6 PI: 5.7 Alarms: none  Events: 10  PI events  Fixed speed: 9000  Low speed limit: 8400     Objective:   Weight Range:  Vital Signs:   Temp:  [97.9 F (36.6 C)-98.8 F (37.1 C)] 98.6 F (37 C) (06/20 0759) Pulse Rate:  [55-74] 74 (06/20 0759) Resp:  [20-25] 25 (06/20 0759) BP: (98-154)/(0-61) 154/61 mmHg (06/20 0759) SpO2:  [94 %-100 %] 100 % (06/20 0759) Weight:  [59.9 kg (132 lb 0.9 oz)] 59.9 kg (132 lb 0.9 oz) (06/20 0340) Last BM Date: 10/28/2013  Weight change: Filed Weights   10/18/13 0500 10/19/13 0457 10/20/13 0340  Weight: 58.2 kg (128 lb 4.9 oz) 58.8 kg (129 lb 10.1 oz) 59.9 kg (132 lb 0.9 oz)    Intake/Output:   Intake/Output Summary (Last 24 hours) at 10/20/13  0810 Last data filed at 10/20/13 0600  Gross per 24 hour  Intake  585.6 ml  Output   1400 ml  Net -814.4 ml     Physical Exam: CVP 25 General: No resp difficulty, lying flat  in bed  HEENT: normal,  Neck: supple. JVP ear with prominent CV waves . Carotids 2+ bilat; no bruits. No lymphadenopathy or thryomegaly appreciated.  Cor: Mechanical heart sounds with LVAD hum present.  Lungs: clear  Abdomen: soft, nontender, nondistended. No hepatosplenomegaly. No bruits or masses. Good bowel sounds.  Driveline: C/D/I; securement device intact and driveline incorporated  Extremities: no cyanosis, clubbing, rash, edema, RUE 2L PICC  Neuro: alert & orientedx3, cranial nerves grossly intact. moves all 4 extremities w/o difficulty. Affect pleasant     Telemetry: SR with PVC  Labs: Basic Metabolic Panel:  Recent Labs Lab 10/14/13 0613 10/25/2013 1329 10/18/13 0435 10/19/13 0355 10/20/13 0500  NA 135* 137 134* 133* 135*  K 4.4 3.7 3.5* 4.5 4.4  CL 97 97 94* 95* 96  CO2 25 21 24 24 25   GLUCOSE 87 118* 103* 116* 113*  BUN 30* 41* 38* 35* 37*  CREATININE 1.65* 2.10* 1.71* 1.52* 1.76*  CALCIUM 9.8 10.1 10.1 9.9 9.8  MG 2.3  --   --   --   --     Liver Function Tests:  Recent Labs Lab 10/14/13  4562 10/26/2013 1329  AST 20 32  ALT 19 29  ALKPHOS 171* 178*  BILITOT 1.7* 2.6*  PROT 7.1 7.4  ALBUMIN 3.8 4.2   No results found for this basename: LIPASE, AMYLASE,  in the last 168 hours No results found for this basename: AMMONIA,  in the last 168 hours  CBC:  Recent Labs Lab 10/25/2013 1329  WBC 9.9  HGB 9.6*  HCT 29.0*  MCV 88.4  PLT 303    Cardiac Enzymes: No results found for this basename: CKTOTAL, CKMB, CKMBINDEX, TROPONINI,  in the last 168 hours  BNP: BNP (last 3 results)  Recent Labs  10/03/13 1306 10/10/13 2001 10/30/2013 1329  PROBNP 1462.0* 1759.0* 3826.0*     Other results:    Imaging: No results found.   Medications:     Scheduled  Medications: . amiodarone  200 mg Oral BID  . amLODipine  10 mg Oral Daily  . citalopram  20 mg Oral Daily  . docusate sodium  100 mg Oral Daily  . ferrous sulfate  325 mg Oral Q breakfast  . magnesium oxide  200 mg Oral Daily  . multivitamins with iron  1 tablet Oral Daily  . pantoprazole  40 mg Oral Daily  . potassium chloride  20 mEq Oral Daily  . sildenafil  20 mg Oral TID  . simvastatin  10 mg Oral q1800  . spironolactone  25 mg Oral BID  . torsemide  20 mg Oral BID  . Warfarin - Pharmacist Dosing Inpatient   Does not apply q1800    Infusions: . DOBUTamine 5 mcg/kg/min (10/20/13 0000)    PRN Medications: acetaminophen, bisacodyl, hydrALAZINE, traMADol, zolpidem   Assessment:   1. 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 Chronic Systolic HF s/p HMII VAD placement  3. Acute on chronic renal failure, stage III-IV  4. Restrictive lung disease  5. H/o VT   Plan/Discussion:    She continues to struggle with profound RHF. Co-ox down again. Will repeat to confirm. Will turn speed down to 8800.  Continue demadex but not sure renal function or BP will tolerate.  May be getting close to a palliative situation. Adjust warfarin. May need to increase dobutamine  Valley Ke,MD 8:10 AM

## 2013-10-20 NOTE — Progress Notes (Signed)
RPM increased to 9200 per Dr. Gala Romney for Co-ox 33 on 8800 RPM. Repeat Co-ox improved at 57; will leave fixed speed at 9200 RPM and continue to monitor.

## 2013-10-20 NOTE — Progress Notes (Signed)
ANTICOAGULATION CONSULT NOTE  Pharmacy Consult for Warfarin Indication: LVAD  Allergies  Allergen Reactions  . Xopenex [Levalbuterol Hcl]     Rash in mouth    Patient Measurements: Height: 5\' 3"  (160 cm) Weight: 132 lb 0.9 oz (59.9 kg) IBW/kg (Calculated) : 52.4   Vital Signs: Temp: 98.5 F (36.9 C) (06/20 1208) Temp src: Oral (06/20 1208) BP: 154/61 mmHg (06/20 0759) Pulse Rate: 74 (06/20 0759)  Labs:  Recent Labs  November 07, 2013 1329 10/18/13 0435 10/19/13 0355 10/20/13 0500  HGB 9.6*  --   --   --   HCT 29.0*  --   --   --   PLT 303  --   --   --   LABPROT 22.3* 28.7* 35.3* 36.9*  INR 2.03* 2.82* 3.70* 3.92*  CREATININE 2.10* 1.71* 1.52* 1.76*    Estimated Creatinine Clearance: 26.4 ml/min (by C-G formula based on Cr of 1.76).   Assessment: 68 yo female on chronic Coumadin for LVAD admitted for profound fatigue and dyspnea. INR remains supratherapeutic today and continues to increase. Will hold dose tonight.   Home dose of Coumadin 2.5 mg daily except 5 mg on MWF. No bleeding or complications noted.  Patient admitted for increased fatigue and dyspnea - home dobutamine gtt was increased to .  Goal of Therapy:  INR 2-3 Monitor platelets by anticoagulation protocol: Yes   Plan:  1. Hold Coumadin today 2. Daily PT/INR.  Thank you, Piedad Climes, PharmD Clinical Pharmacist - Resident Pager: [phone] Pharmacy: [phone] 10/20/2013 1:20 PM

## 2013-10-21 DIAGNOSIS — N179 Acute kidney failure, unspecified: Secondary | ICD-10-CM

## 2013-10-21 DIAGNOSIS — I472 Ventricular tachycardia, unspecified: Secondary | ICD-10-CM

## 2013-10-21 DIAGNOSIS — I4729 Other ventricular tachycardia: Secondary | ICD-10-CM

## 2013-10-21 DIAGNOSIS — I5022 Chronic systolic (congestive) heart failure: Secondary | ICD-10-CM

## 2013-10-21 DIAGNOSIS — I5023 Acute on chronic systolic (congestive) heart failure: Principal | ICD-10-CM

## 2013-10-21 DIAGNOSIS — N189 Chronic kidney disease, unspecified: Secondary | ICD-10-CM

## 2013-10-21 DIAGNOSIS — I509 Heart failure, unspecified: Secondary | ICD-10-CM

## 2013-10-21 LAB — MAGNESIUM: Magnesium: 2.2 mg/dL (ref 1.5–2.5)

## 2013-10-21 LAB — CARBOXYHEMOGLOBIN
CARBOXYHEMOGLOBIN: 1.1 % (ref 0.5–1.5)
Carboxyhemoglobin: 1 % (ref 0.5–1.5)
METHEMOGLOBIN: 1.2 % (ref 0.0–1.5)
Methemoglobin: 1 % (ref 0.0–1.5)
O2 SAT: 39.3 %
O2 Saturation: 42.5 %
TOTAL HEMOGLOBIN: 9.2 g/dL — AB (ref 12.0–16.0)
Total hemoglobin: 9.8 g/dL — ABNORMAL LOW (ref 12.0–16.0)

## 2013-10-21 LAB — PROTIME-INR
INR: 3 — ABNORMAL HIGH (ref 0.00–1.49)
PROTHROMBIN TIME: 30.1 s — AB (ref 11.6–15.2)

## 2013-10-21 LAB — BASIC METABOLIC PANEL
BUN: 40 mg/dL — ABNORMAL HIGH (ref 6–23)
CO2: 22 mEq/L (ref 19–32)
CREATININE: 2.03 mg/dL — AB (ref 0.50–1.10)
Calcium: 10.3 mg/dL (ref 8.4–10.5)
Chloride: 93 mEq/L — ABNORMAL LOW (ref 96–112)
GFR, EST AFRICAN AMERICAN: 28 mL/min — AB (ref >90)
GFR, EST NON AFRICAN AMERICAN: 25 mL/min — AB (ref >90)
Glucose, Bld: 118 mg/dL — ABNORMAL HIGH (ref 70–99)
POTASSIUM: 4.1 meq/L (ref 3.7–5.3)
Sodium: 133 mEq/L — ABNORMAL LOW (ref 137–147)

## 2013-10-21 MED ORDER — MIDAZOLAM HCL 2 MG/2ML IJ SOLN
INTRAMUSCULAR | Status: AC
  Administered 2013-10-21: 2 mg
  Filled 2013-10-21: qty 2

## 2013-10-21 MED ORDER — WARFARIN SODIUM 2.5 MG PO TABS
2.5000 mg | ORAL_TABLET | Freq: Once | ORAL | Status: AC
  Administered 2013-10-21: 2.5 mg via ORAL
  Filled 2013-10-21: qty 1

## 2013-10-21 MED ORDER — FENTANYL CITRATE 0.05 MG/ML IJ SOLN
INTRAMUSCULAR | Status: AC
  Administered 2013-10-21: 50 ug
  Filled 2013-10-21: qty 2

## 2013-10-21 MED ORDER — AMIODARONE HCL IN DEXTROSE 360-4.14 MG/200ML-% IV SOLN
30.0000 mg/h | INTRAVENOUS | Status: DC
  Administered 2013-10-21 – 2013-10-23 (×6): 30 mg/h via INTRAVENOUS
  Filled 2013-10-21 (×13): qty 200

## 2013-10-21 MED ORDER — FENTANYL CITRATE 0.05 MG/ML IJ SOLN
100.0000 ug | Freq: Once | INTRAMUSCULAR | Status: AC
  Administered 2013-10-21: 100 ug via INTRAVENOUS

## 2013-10-21 MED ORDER — AMIODARONE HCL IN DEXTROSE 360-4.14 MG/200ML-% IV SOLN
INTRAVENOUS | Status: AC
  Administered 2013-10-21: 200 mL
  Filled 2013-10-21: qty 200

## 2013-10-21 MED ORDER — AMIODARONE IV BOLUS ONLY 150 MG/100ML
150.0000 mg | Freq: Once | INTRAVENOUS | Status: AC
  Administered 2013-10-21: 150 mg via INTRAVENOUS

## 2013-10-21 MED ORDER — MIDAZOLAM HCL 2 MG/2ML IJ SOLN
2.0000 mg | Freq: Once | INTRAMUSCULAR | Status: AC
  Administered 2013-10-21: 2 mg via INTRAVENOUS
  Filled 2013-10-21: qty 2

## 2013-10-21 NOTE — Progress Notes (Signed)
Patient was out of bed on bedside commode.  Right upper arm PICC was found lying on floor when I entered room to assist patient.  Minimal bleeding noted at site.  Site cleansed and sterile dressing applied.  Two peripheral  IV's started without difficulty.  Dr Dorris Fetch notified and made aware of events.

## 2013-10-21 NOTE — Progress Notes (Signed)
Patient ID: Kaitlin LameJacqueline C [name], female   DOB: Nov 30, 1945, 68 y.o.   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 She is being admitted from HF Clinic on 6/17 for recurrent RHF.   8/18 Underwent HM II LVAD implant (under destination criteria) along with TV repair (tricuspid annuloplasty). Post-op course c/b RHF requiring take back to the OR for bleeding and temporay RVAD support. Subsequently started on dobutamine for RV support.   Admitted in early June for recurrent VT in setting of hypokalemia which was complicated by  recurrent RHF. Dobutamine increased to 4 mcg/kg/min. RAMP ECHO performed and speed turned down to 9200 to hopefully allow RV to keep up.   Admitted from HF clinic with fatigue and dyspnea on minimal exertion. Dobutamine was increased to 5 mcg and diuretics held due to worsening renal functions. On admit VAD speed turned down to 9000 due RHF.   Friday started back on torsemide. I/O -834 with CVP 25 still.  Co-ox has been poor for the last few days.  Today, it was 42% and 39% when repeated.  LVAD speed was turned down to 8800 yesterday then back up to 9200.   This morning, patient went into VT with rate in the 150s. I sedated her with Versed 3 mg IV and Fentanyl 100 mcg IV and did DCCV at 150 J with resumption of paced rhythm.    CO-OX 44%> 37>53%  >28%>42%/39% Creatinine 2.1> 1.7>1.5  >1.8 >2.0 INR 3   LVAD interrogation reveals:  Speed: 9200  Flow: 3.7 Power: 4.8 PI: 6 Alarms: none  Events: Multiple PI events   Objective:   Weight Range:  Vital Signs:   Temp:  [97.4 F (36.3 C)-98.6 F (37 C)] 97.4 F (36.3 C) (06/21 0300) Pulse Rate:  [30-88] 59 (06/21 0715) Resp:  [16-29] 18 (06/21 0715) BP: (154)/(61) 154/61 mmHg (06/20 0759) SpO2:  [86 %-100 %] 95 % (06/21  0715) Weight:  [60.9 kg (134 lb 4.2 oz)] 60.9 kg (134 lb 4.2 oz) (06/21 0300) Last BM Date: 10/11/2013  Weight change: Filed Weights   10/19/13 0457 10/20/13 0340 10/21/13 0300  Weight: 58.8 kg (129 lb 10.1 oz) 59.9 kg (132 lb 0.9 oz) 60.9 kg (134 lb 4.2 oz)    Intake/Output:   Intake/Output Summary (Last 24 hours) at 10/21/13 0742 Last data filed at 10/21/13 0500  Gross per 24 hour  Intake  765.6 ml  Output   1600 ml  Net -834.4 ml     Physical Exam: CVP 25 General: No resp difficulty, lying flat  in bed  HEENT: normal,  Neck: supple. JVP ear with prominent CV waves . Carotids 2+ bilat; no bruits. No lymphadenopathy or thryomegaly appreciated.  Cor: Mechanical heart sounds with LVAD hum present.  Lungs: clear  Abdomen: soft, nontender, nondistended. No hepatosplenomegaly. No bruits or masses. Good bowel sounds.  Driveline: C/D/I; securement device intact and driveline incorporated  Extremities: no cyanosis, clubbing, rash, edema, RUE 2L PICC  Neuro: alert & orientedx3, cranial nerves grossly intact. moves all 4 extremities w/o difficulty. Affect pleasant   Telemetry: SR with PVC  Labs: Basic Metabolic Panel:  Recent Labs Lab 10/16/2013 1329 10/18/13 0435 10/19/13 0355 10/20/13 0500 10/21/13 0411  NA 137 134* 133* 135* 133*  K 3.7 3.5* 4.5 4.4 4.1  CL 97 94* 95* 96 93*  CO2 21 24 24 25 22   GLUCOSE 118* 103* 116* 113* 118*  BUN 41* 38* 35* 37* 40*  CREATININE 2.10* 1.71* 1.52* 1.76* 2.03*  CALCIUM 10.1 10.1 9.9 9.8 10.3    Liver Function Tests:  Recent Labs Lab 11-05-2013 1329  AST 32  ALT 29  ALKPHOS 178*  BILITOT 2.6*  PROT 7.4  ALBUMIN 4.2   No results found for this basename: LIPASE, AMYLASE,  in the last 168 hours No results found for this basename: AMMONIA,  in the last 168 hours  CBC:  Recent Labs Lab 11-05-13 1329  WBC 9.9  HGB 9.6*  HCT 29.0*  MCV 88.4  PLT 303    Cardiac Enzymes: No results found for this basename: CKTOTAL, CKMB,  CKMBINDEX, TROPONINI,  in the last 168 hours  BNP: BNP (last 3 results)  Recent Labs  10/03/13 1306 10/10/13 2001 05-Nov-2013 1329  PROBNP 1462.0* 1759.0* 3826.0*     Other results:    Imaging: No results found.   Medications:     Scheduled Medications: . amLODipine  10 mg Oral Daily  . citalopram  20 mg Oral Daily  . docusate sodium  100 mg Oral Daily  . ferrous sulfate  325 mg Oral Q breakfast  . magnesium oxide  200 mg Oral Daily  . multivitamins with iron  1 tablet Oral Daily  . pantoprazole  40 mg Oral Daily  . potassium chloride  20 mEq Oral Daily  . sildenafil  20 mg Oral TID  . simvastatin  10 mg Oral q1800  . spironolactone  25 mg Oral BID  . torsemide  20 mg Oral BID  . Warfarin - Pharmacist Dosing Inpatient   Does not apply q1800    Infusions: . amiodarone    . DOBUTamine 5 mcg/kg/min (10/20/13 0800)    PRN Medications: acetaminophen, bisacodyl, hydrALAZINE, traMADol, zolpidem   Assessment:   1. 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 Chronic Systolic HF s/p HMII VAD placement  3. Acute on chronic renal failure, stage III-IV  4. Restrictive lung disease  5. H/o VT: Most recently VT am 6/21.    Plan/Discussion:    She continues to struggle with profound RHF. Co-ox remains low and patient was in VT this morning. Speed back up to 9200.  I am not going to adjust up dobutamine today with recent sustained VT.  MAP in 80s post-DCCV.    I am going to give her amiodarone IV today with VT.  K was ok, will check Mg.   Continue demadex for now but creatinine up.  Suspect we are getting close to a palliative situation. Continue warfarin.   Will discuss with daughter.   Dalton McLean,MD 7:42 AM

## 2013-10-21 NOTE — Progress Notes (Signed)
Co-Ox reported as below reportable level.  Patient had just been OOB for am weight and up to [facility].  Will redraw after patient has rested in bed.

## 2013-10-21 NOTE — Progress Notes (Signed)
12 lead EKG done.  Dr Jon Billings at bedside.  Telephone conversation held with Dr Gala Romney.  Orders received. Patient to receive Amiodarone 150 mg IV bolus. Dr Shirlee Latch called to bedside.  EKG reviewed.  Preparation made to cardiovert patient at bedside.  Daughter called and updated on patient's status.  VS remain stable. Patient denies any chest pain or SOB.

## 2013-10-21 NOTE — Progress Notes (Signed)
Patient assisted up to bedside commode and went into Vtach rhythm with VR in 160's.  Patient denies chest pain or SOB.  Doppler BP = 106. Dr Gala Romney called by Marcene Brawn CN  and orders received.

## 2013-10-21 NOTE — Progress Notes (Signed)
ANTICOAGULATION CONSULT NOTE  Pharmacy Consult for Warfarin Indication: LVAD  Allergies  Allergen Reactions  . Xopenex [Levalbuterol Hcl]     Rash in mouth    Patient Measurements: Height: 5\' 3"  (160 cm) Weight: 134 lb 4.2 oz (60.9 kg) IBW/kg (Calculated) : 52.4   Vital Signs: Temp: 98.6 F (37 C) (06/21 0715) Temp src: Oral (06/21 0715) Pulse Rate: 60 (06/21 0800)  Labs:  Recent Labs  10/19/13 0355 10/20/13 0500 10/21/13 0411  LABPROT 35.3* 36.9* 30.1*  INR 3.70* 3.92* 3.00*  CREATININE 1.52* 1.76* 2.03*    Estimated Creatinine Clearance: 22.9 ml/min (by C-G formula based on Cr of 2.03).   Assessment: 68 yo [name] on chronic Coumadin for LVAD admitted for profound fatigue and dyspnea. INR on high end of therapeutic range today.  Will start redosing again today.     Home dose of Coumadin 2.5 mg daily except 5 mg on MWF. No bleeding or complications noted.  Patient admitted for increased fatigue and dyspnea - home dobutamine gtt was increased to .  Goal of Therapy:  INR 2-3 Monitor platelets by anticoagulation protocol: Yes   Plan:  1. Coumadin 2.5mg  tonight 2. Daily PT/INR  Thank you, Piedad Climes, PharmD Clinical Pharmacist - Resident Pager: [phone] Pharmacy: [phone] 10/21/2013 10:46 AM

## 2013-10-21 NOTE — Discharge Instructions (Signed)
Information on my medicine - Coumadin   (Warfarin)  This medication education was reviewed with me or my healthcare representative as part of my discharge preparation.  The pharmacist that spoke with me during my hospital stay was:   Why was Coumadin prescribed for you? Coumadin was prescribed for you because you have a blood clot or a medical condition that can cause an increased risk of forming blood clots. Blood clots can cause serious health problems by blocking the flow of blood to the heart, lung, or brain. Coumadin can prevent harmful blood clots from forming. As a reminder your indication for Coumadin is:   Blood Clot Prevention After Heart Pump Surgery  What test will check on my response to Coumadin? While on Coumadin (warfarin) you will need to have an INR test regularly to ensure that your dose is keeping you in the desired range. The INR (international normalized ratio) number is calculated from the result of the laboratory test called prothrombin time (PT).  If an INR APPOINTMENT HAS NOT ALREADY BEEN MADE FOR YOU please schedule an appointment to have this lab work done by your health care provider within 7 days. Your INR goal is usually a number between:  2 to 3 or your provider may give you a more narrow range like 2-2.5.  Ask your health care provider during an office visit what your goal INR is.  What  do you need to  know  About  COUMADIN? Take Coumadin (warfarin) exactly as prescribed by your healthcare provider about the same time each day.  DO NOT stop taking without talking to the doctor who prescribed the medication.  Stopping without other blood clot prevention medication to take the place of Coumadin may increase your risk of developing a new clot or stroke.  Get refills before you run out.  What do you do if you miss a dose? If you miss a dose, take it as soon as you remember on the same day then continue your regularly scheduled regimen the next day.  Do not take two  doses of Coumadin at the same time.  Important Safety Information A possible side effect of Coumadin (Warfarin) is an increased risk of bleeding. You should call your healthcare provider right away if you experience any of the following:   Bleeding from an injury or your nose that does not stop.   Unusual colored urine (red or dark brown) or unusual colored stools (red or black).   Unusual bruising for unknown reasons.   A serious fall or if you hit your head (even if there is no bleeding).  Some foods or medicines interact with Coumadin (warfarin) and might alter your response to warfarin. To help avoid this:   Eat a balanced diet, maintaining a consistent amount of Vitamin K.   Notify your provider about major diet changes you plan to make.   Avoid alcohol or limit your intake to 1 drink for women and 2 drinks for men per day. (1 drink is 5 oz. wine, 12 oz. beer, or 1.5 oz. liquor.)  Make sure that ANY health care provider who prescribes medication for you knows that you are taking Coumadin (warfarin).  Also make sure the healthcare provider who is monitoring your Coumadin knows when you have started a new medication including herbals and non-prescription products.  Coumadin (Warfarin)  Major Drug Interactions  Increased Warfarin Effect Decreased Warfarin Effect  Alcohol (large quantities) Antibiotics (esp. Septra/Bactrim, Flagyl, Cipro) Amiodarone (Cordarone) Aspirin (ASA) Cimetidine (Tagamet)  Megestrol (Megace) °NSAIDs (ibuprofen, naproxen, etc.) °Piroxicam (Feldene) °Propafenone (Rythmol SR) °Propranolol (Inderal) °Isoniazid (INH) °Posaconazole (Noxafil) Barbiturates (Phenobarbital) °Carbamazepine (Tegretol) °Chlordiazepoxide (Librium) °Cholestyramine (Questran) °Griseofulvin °Oral Contraceptives °Rifampin °Sucralfate (Carafate) °Vitamin K  ° °Coumadin® (Warfarin) Major Herbal Interactions  °Increased Warfarin Effect Decreased Warfarin Effect  °Garlic °Ginseng °Ginkgo biloba Coenzyme  Q10 °Green tea °St. John’s wort   ° °Coumadin® (Warfarin) FOOD Interactions  °Eat a consistent number of servings per week of foods HIGH in Vitamin K °(1 serving = ½ cup)  °Collards (cooked, or boiled & drained) °Kale (cooked, or boiled & drained) °Mustard greens (cooked, or boiled & drained) °Parsley *serving size only = ¼ cup °Spinach (cooked, or boiled & drained) °Swiss chard (cooked, or boiled & drained) °Turnip greens (cooked, or boiled & drained)  °Eat a consistent number of servings per week of foods MEDIUM-HIGH in Vitamin K °(1 serving = 1 cup)  °Asparagus (cooked, or boiled & drained) °Broccoli (cooked, boiled & drained, or raw & chopped) °Brussel sprouts (cooked, or boiled & drained) *serving size only = ½ cup °Lettuce, raw (green leaf, endive, romaine) °Spinach, raw °Turnip greens, raw & chopped  ° °These websites have more information on Coumadin (warfarin):  www.coumadin.com; °www.ahrq.gov/consumer/coumadin.htm; ° ° ° °

## 2013-10-22 ENCOUNTER — Telehealth (HOSPITAL_COMMUNITY): Payer: Self-pay | Admitting: Cardiology

## 2013-10-22 DIAGNOSIS — Z95811 Presence of heart assist device: Secondary | ICD-10-CM

## 2013-10-22 LAB — CBC
HEMATOCRIT: 28.8 % — AB (ref 36.0–46.0)
Hemoglobin: 9.4 g/dL — ABNORMAL LOW (ref 12.0–15.0)
MCH: 28.6 pg (ref 26.0–34.0)
MCHC: 32.6 g/dL (ref 30.0–36.0)
MCV: 87.5 fL (ref 78.0–100.0)
Platelets: 314 10*3/uL (ref 150–400)
RBC: 3.29 MIL/uL — AB (ref 3.87–5.11)
RDW: 16.4 % — ABNORMAL HIGH (ref 11.5–15.5)
WBC: 12.1 10*3/uL — ABNORMAL HIGH (ref 4.0–10.5)

## 2013-10-22 LAB — BASIC METABOLIC PANEL
BUN: 39 mg/dL — ABNORMAL HIGH (ref 6–23)
CALCIUM: 10 mg/dL (ref 8.4–10.5)
CHLORIDE: 91 meq/L — AB (ref 96–112)
CO2: 20 mEq/L (ref 19–32)
Creatinine, Ser: 2.08 mg/dL — ABNORMAL HIGH (ref 0.50–1.10)
GFR calc Af Amer: 28 mL/min — ABNORMAL LOW (ref >90)
GFR calc non Af Amer: 24 mL/min — ABNORMAL LOW (ref >90)
GLUCOSE: 106 mg/dL — AB (ref 70–99)
Potassium: 4 mEq/L (ref 3.7–5.3)
Sodium: 130 mEq/L — ABNORMAL LOW (ref 137–147)

## 2013-10-22 LAB — LACTATE DEHYDROGENASE: LDH: 266 U/L — AB (ref 94–250)

## 2013-10-22 LAB — PROTIME-INR
INR: 2.22 — ABNORMAL HIGH (ref 0.00–1.49)
Prothrombin Time: 23.9 seconds — ABNORMAL HIGH (ref 11.6–15.2)

## 2013-10-22 MED ORDER — FENTANYL CITRATE 0.05 MG/ML IJ SOLN
100.0000 ug | Freq: Once | INTRAMUSCULAR | Status: AC
  Administered 2013-10-22: 100 ug via INTRAVENOUS

## 2013-10-22 MED ORDER — WARFARIN SODIUM 5 MG PO TABS
5.0000 mg | ORAL_TABLET | Freq: Once | ORAL | Status: AC
  Administered 2013-10-22: 5 mg via ORAL
  Filled 2013-10-22: qty 1

## 2013-10-22 MED ORDER — MIDAZOLAM HCL 2 MG/2ML IJ SOLN
INTRAMUSCULAR | Status: AC
  Filled 2013-10-22: qty 2

## 2013-10-22 MED ORDER — MEXILETINE HCL 150 MG PO CAPS
150.0000 mg | ORAL_CAPSULE | Freq: Three times a day (TID) | ORAL | Status: DC
  Administered 2013-10-22 – 2013-10-27 (×15): 150 mg via ORAL
  Filled 2013-10-22 (×18): qty 1

## 2013-10-22 MED ORDER — AMIODARONE IV BOLUS ONLY 150 MG/100ML
150.0000 mg | Freq: Once | INTRAVENOUS | Status: AC
  Administered 2013-10-22: 150 mg via INTRAVENOUS

## 2013-10-22 MED ORDER — FENTANYL CITRATE 0.05 MG/ML IJ SOLN
INTRAMUSCULAR | Status: AC
  Filled 2013-10-22: qty 2

## 2013-10-22 MED ORDER — MIDAZOLAM HCL 2 MG/2ML IJ SOLN
2.0000 mg | Freq: Once | INTRAMUSCULAR | Status: AC
  Administered 2013-10-22: 2 mg via INTRAVENOUS

## 2013-10-22 MED ORDER — AMIODARONE LOAD VIA INFUSION
150.0000 mg | Freq: Once | INTRAVENOUS | Status: AC
  Administered 2013-10-22: 150 mg via INTRAVENOUS

## 2013-10-22 NOTE — Progress Notes (Signed)
ANTICOAGULATION CONSULT NOTE - Follow Up Consult  Pharmacy Consult for Coumadin Indication: LVAD  Allergies  Allergen Reactions  . Xopenex [Levalbuterol Hcl]     Rash in mouth    Patient Measurements: Wt = 134lb Ht = 160cm  Vital Signs: Temp: 98.6 F (37 C) (06/22 0733) Temp src: Oral (06/22 0733) Pulse Rate: 64 (06/22 0800)  Labs:  Recent Labs  10/20/13 0500 10/21/13 0411 10/22/13 0235  HGB  --   --  9.4*  HCT  --   --  28.8*  PLT  --   --  314  LABPROT 36.9* 30.1* 23.9*  INR 3.92* 3.00* 2.22*  CREATININE 1.76* 2.03* 2.08*    Estimated Creatinine Clearance: 22.3 ml/min (by C-G formula based on Cr of 2.08).  Assessment: 67yof continues on coumadin for LVAD (12/2012). Coumadin doses held 6/19 and 6/20 due to elevated INR and then resumed yesterday. INR is therapeutic today.  Patient was on po amiodarone pta but has been switched to IV amiodarone (6/21) due to recurrent VT.  Home dose 2.5mg  daily except 5mg  MWF  Goal of Therapy:  INR 2-3 Monitor platelets by anticoagulation protocol: Yes   Plan:  1) Coumadin 5mg  x 1 2) INR in AM  Fredrik Rigger 10/22/2013,9:41 AM

## 2013-10-22 NOTE — Progress Notes (Signed)
1055 Read progress note and talked with pt's RN. Will hold ambulation and continue to follow pt.  Luetta Nutting RN BSN 10/22/2013 10:53 AM

## 2013-10-22 NOTE — Progress Notes (Signed)
  Hessie Diener (VAD coordinator) and I had a long talk with Ms. Klos and her family regarding her worsening RV failure and VT. I told her that I thought she was getting worse and that we had essentially run out of options to treat her RV failure.   We discussed 3 options:  1) Continue current therapy with home dobutamine and [facility] 2) Stop dobutamine and discharge her with [facility] (she does not want to go to [facility]) 3) Proceed with full comfort care and stop dobutamine and LVAD which would almost certainly lead to in-hospital death.  Me Pallo asked me which option I recommended and I told them that the decision should be based on how she feels currently. If she has some QOL and enjoys herself despite the daily fluctuations she should continue with full support for now. If she is really miserable than #3 is likely the best option. If she is not sure then perhaps #2 is best.   She will meet with Palliative Care tomorrow at 10a to discuss further and see what options they can provide. Her daughter is very concerned about her returning home.   For now will continue dobutamine and we have started mexilitene to help suppress VT.  [name] 6:25 PM

## 2013-10-22 NOTE — Progress Notes (Signed)
[facility]  Patient Status: Active Pt with [facility] prior to this admission     [facility] is providing the following services: HHRN and home infusion pharmacy for home dobutamine therapy.  [facility] team will follow [name] and support DC home when deemed appropriate.  If patient discharges after hours, please call [phone].   Kaitlin [name] 10/22/2013, 2:50 PM

## 2013-10-22 NOTE — Telephone Encounter (Signed)
Pt admitted into hospital from CHF clinic Cpt code 68115 icd 9 428.22 With pts current insurance- [employer] # [id number] valld 10/22/2013-10/22/13 If pts hospitalization is longer inpatient will need to contact for additional days on pre cert

## 2013-10-22 NOTE — Progress Notes (Signed)
Patient assisted up to bedside commode.  Rhythm quickly changed to SVT versus VT rhythm with VR 160's.  Patient admitted to slight dizziness and mild nausea.  Placed back in bed and cardiology Fellow, Leeann Must MD  paged.  Update provided and orders received.  Patient calm and relaxed.  Denies chest pain or SOB.  Skin warm and dry. First 150 mg IV  bolus of Amiodarone started.

## 2013-10-22 NOTE — Progress Notes (Addendum)
Brief Progress Note  Kaitlin [name] went into VT this AM after turning in bed. Vitals reviewed; BP 154/61  Pulse 63  Temp(Src) 98.8 F (37.1 C) (Oral)  Resp 24  Ht 5\' 3"  (1.6 m)  Wt 60.9 kg (134 lb 4.2 oz)  BMI 23.79 kg/m2  SpO2 94%She tells me she is mildly dizzy. Labs drawn at 02:xx and fairly unrevealing. Will trial IV amiodarone and if she remains symptomatic will move towards emergent cardioversion given severe symptomatic biventricular HF. She received 150 mg IV amiodarone bolus x2 without resolution and continued symptoms.   Leeann Must, MD  Kaitlin. Karn VT slowed down slightly in response to IV amiodarone but she continues to have dizziness and feels terrible. I discussed the scenario with Kaitlin. Spurr and she understands and mainly wishes to feel better. Will proceed with emergent cardioversion with 2mg  versed and 100 mcg fentanyl.  VAD team will also be updated. She was successfully cardioverted with 150J synchronized therapy into paced rhythm. Family also updated.   Leeann Must, MD

## 2013-10-22 NOTE — Progress Notes (Signed)
Rhythm persisted.  No change in patient's complaints.  Daughter called at patient's request and informed about repeat rhythm change this am.   Dr Shirlee Latch notified and returned call at 0527.  Events reported to him including treatments given thus far.  Order to proceed with cardioversion given.  Dr Leeann Must present at bedside.  RT called and present.  Patient premedicated with Fentanyl and Versed.  Successfully cardioverted with one shock at 150 joules.  Resting comfortably at present.  Doppler BP 84 and stable.  Careful monitoring continues.

## 2013-10-22 NOTE — Progress Notes (Signed)
Ms. Follmer and her family to meet with palliative care - Lorinda Creed, NP - 6/23 1000 am. Family wishing to speak with Dr. Gala Romney first and he is planning on meeting this afternoon with them. Thank you for this consult.  Yong Channel, NP Palliative Medicine Team Pager # [phone] (M-F 8a-5p) Team Phone # [phone] (Nights/Weekends)

## 2013-10-22 NOTE — Progress Notes (Signed)
Patient ID: Kaitlin [name], female   DOB: July 14, 1945, 68 y.o.   MRN: [id number] Advanced Heart Failure Rounding Note   Subjective:    Kaitlin [name] is a 68 y/o woman with h/o severe biventricular HF due to NICM s/p HM II LVAD implant (12/2012), VT s/ Biotronik ICD, CKD (Baseline Cr 1.5-1.6) and RHF. She is being admitted from HF Clinic on 6/17 for recurrent RHF.   8/18 Underwent HM II LVAD implant (under destination criteria) along with TV repair (tricuspid annuloplasty). Post-op course c/b RHF requiring take back to the OR for bleeding and temporay RVAD support. Subsequently started on dobutamine for RV support.   Admitted in early June for recurrent VT in setting of hypokalemia which was complicated by  recurrent RHF. Dobutamine increased to 4 mcg/kg/min. RAMP ECHO performed and speed turned down to 9200 to hopefully allow RV to keep up.   Admitted from HF clinic with fatigue and dyspnea on minimal exertion. Dobutamine was increased to 5 mcg and diuretics held due to worsening renal functions. On admit VAD speed turned down to 9000 due RHF.   Friday started back on torsemide. I/O even yesterday, last CVP > 20 but PICC out now.  Co-ox has been poor for the last few days, PICC came out. Yesterday, it was 42% and 39% when repeated.  LVAD speed was turned down to 8800 then back up to 9200.   Yesterday morning, patient went into VT with rate in the 150s. I sedated her with Versed 3 mg IV and Fentanyl 100 mcg IV and did DCCV at 150 J with resumption of paced rhythm.  Patient went into VT at 150 bpm again this morning, again DCCV at 150 J.    CO-OX 44%> 37>53%  >28%>42%/39%>PICC out Creatinine 2.1> 1.7>1.5  >1.8 >2.0 >2.0 INR 3   LVAD interrogation reveals:  Speed: 9200  Flow: 3.7 Power: 4.8 PI: 5.7 Alarms: none  Events: Multiple PI events (was in VT x 2 hrs early this am)  Objective:   Weight Range:  Vital Signs:   Temp:  [98 F (36.7 C)-98.8 F (37.1 C)] 98.6 F (37 C) (06/22  0733) Pulse Rate:  [58-152] 71 (06/22 0733) Resp:  [14-28] 18 (06/22 0733) SpO2:  [86 %-99 %] 98 % (06/22 0733) Weight:  [60.9 kg (134 lb 4.2 oz)] 60.9 kg (134 lb 4.2 oz) (06/22 0500) Last BM Date: 10/21/13  Weight change: Filed Weights   10/20/13 0340 10/21/13 0300 10/22/13 0500  Weight: 59.9 kg (132 lb 0.9 oz) 60.9 kg (134 lb 4.2 oz) 60.9 kg (134 lb 4.2 oz)    Intake/Output:   Intake/Output Summary (Last 24 hours) at 10/22/13 0753 Last data filed at 10/22/13 0700  Gross per 24 hour  Intake 1309.3 ml  Output   1150 ml  Net  159.3 ml     Physical Exam: General: No resp difficulty, lying flat  in bed  HEENT: normal,  Neck: supple. JVP ear with prominent CV waves . Carotids 2+ bilat; no bruits. No lymphadenopathy or thryomegaly appreciated.  Cor: Mechanical heart sounds with LVAD hum present.  Lungs: clear  Abdomen: soft, nontender, nondistended. No hepatosplenomegaly. No bruits or masses. Good bowel sounds.  Driveline: C/D/I; securement device intact and driveline incorporated  Extremities: no cyanosis, clubbing, rash, edema, RUE 2L PICC  Neuro: alert & orientedx3, cranial nerves grossly intact. moves all 4 extremities w/o difficulty. Affect pleasant   Telemetry: V-paced  Labs: Basic Metabolic Panel:  Recent Labs Lab 10/18/13 0435 10/19/13  [id number] 10/20/13 0500 10/21/13 0411 10/21/13 0830 10/22/13 0235  NA 134* 133* 135* 133*  --  130*  K 3.5* 4.5 4.4 4.1  --  4.0  CL 94* 95* 96 93*  --  91*  CO2 24 24 25 22   --  20  GLUCOSE 103* 116* 113* 118*  --  106*  BUN 38* 35* 37* 40*  --  39*  CREATININE 1.71* 1.52* 1.76* 2.03*  --  2.08*  CALCIUM 10.1 9.9 9.8 10.3  --  10.0  MG  --   --   --   --  2.2  --     Liver Function Tests:  Recent Labs Lab 10/08/2013 1329  AST 32  ALT 29  ALKPHOS 178*  BILITOT 2.6*  PROT 7.4  ALBUMIN 4.2   No results found for this basename: LIPASE, AMYLASE,  in the last 168 hours No results found for this basename: AMMONIA,  in the  last 168 hours  CBC:  Recent Labs Lab 10/11/2013 1329 10/22/13 0235  WBC 9.9 12.1*  HGB 9.6* 9.4*  HCT 29.0* 28.8*  MCV 88.4 87.5  PLT 303 314    Cardiac Enzymes: No results found for this basename: CKTOTAL, CKMB, CKMBINDEX, TROPONINI,  in the last 168 hours  BNP: BNP (last 3 results)  Recent Labs  10/03/13 1306 10/10/13 2001 10/03/2013 1329  PROBNP 1462.0* 1759.0* 3826.0*     Other results:    Imaging: No results found.   Medications:     Scheduled Medications: . amLODipine  10 mg Oral Daily  . citalopram  20 mg Oral Daily  . docusate sodium  100 mg Oral Daily  . ferrous sulfate  325 mg Oral Q breakfast  . magnesium oxide  200 mg Oral Daily  . multivitamins with iron  1 tablet Oral Daily  . pantoprazole  40 mg Oral Daily  . potassium chloride  20 mEq Oral Daily  . sildenafil  20 mg Oral TID  . simvastatin  10 mg Oral q1800  . spironolactone  25 mg Oral BID  . torsemide  20 mg Oral BID  . Warfarin - Pharmacist Dosing Inpatient   Does not apply q1800    Infusions: . amiodarone 30 mg/hr (10/22/13 0451)  . DOBUTamine 5 mcg/kg/min (10/21/13 2026)    PRN Medications: acetaminophen, bisacodyl, hydrALAZINE, traMADol, zolpidem   Assessment:   1. 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 Chronic Systolic HF s/p HMII VAD placement  3. Acute on chronic renal failure, stage III-IV  4. Restrictive lung disease  5. H/o VT: Most recently VT am 6/22.    Plan/Discussion:    She continues to struggle with profound RHF. Co-ox has remained low and patient was in VT again this morning requiring. Speed back up to 9200.  I am not going to adjust up dobutamine with recent multiple episodes of sustained VT.  MAP in 80s post-DCCV.    Patient has only a VF zone (220) on her ICD.  No ATP as has accelerated VT in the past.   Continue IV amiodarone.  Continue current torsemide and coumadin for now.    I suspect that we have reached the limit of what we are going  to be able to do medically here.  I had a long discussion with patient this morning after the most recent cardioversion.  I spoke with her daughter about hospice involvement yesterday.  I am going to get a palliative care consult today with an eye towards moving  to hospice care.  Will decide on PICC replacement after palliative care consult.   Fransico Meadow 7:53 AM 10/22/2013

## 2013-10-22 NOTE — Progress Notes (Signed)
PT Cancellation Note  Patient Details Name: Kaitlin [name] MRN: [id number] DOB: 08-20-1945   Cancelled Treatment:    Reason Eval/Treat Not Completed: Medical issues which prohibited therapy.  Events of today noted.  Will hold PT today.  Will return at later time for PT as appropriate.   Vena Austria 10/22/2013, 1:50 PM Kaitlin [name]. Kaitlin [name], [facility] Acute Rehab Services Pager [phone]

## 2013-10-22 NOTE — Progress Notes (Signed)
Second 150 mg IV bolus of Amiodarone completed with no change noted in patient's rhythm or heart rate.  Patient states that she feels no better. Admits to feeling "hot" at present.  Will continue to closely monitor.  I remain at bedside in constant attendance.

## 2013-10-23 DIAGNOSIS — R5381 Other malaise: Secondary | ICD-10-CM

## 2013-10-23 DIAGNOSIS — R5383 Other fatigue: Secondary | ICD-10-CM

## 2013-10-23 DIAGNOSIS — Z515 Encounter for palliative care: Secondary | ICD-10-CM

## 2013-10-23 LAB — PROTIME-INR
INR: 2.02 — AB (ref 0.00–1.49)
Prothrombin Time: 22.2 seconds — ABNORMAL HIGH (ref 11.6–15.2)

## 2013-10-23 LAB — BASIC METABOLIC PANEL
BUN: 36 mg/dL — AB (ref 6–23)
CO2: 23 meq/L (ref 19–32)
Calcium: 10 mg/dL (ref 8.4–10.5)
Chloride: 92 mEq/L — ABNORMAL LOW (ref 96–112)
Creatinine, Ser: 2.04 mg/dL — ABNORMAL HIGH (ref 0.50–1.10)
GFR calc Af Amer: 28 mL/min — ABNORMAL LOW (ref >90)
GFR, EST NON AFRICAN AMERICAN: 24 mL/min — AB (ref >90)
GLUCOSE: 107 mg/dL — AB (ref 70–99)
Potassium: 3.8 mEq/L (ref 3.7–5.3)
SODIUM: 133 meq/L — AB (ref 137–147)

## 2013-10-23 LAB — CBC
HEMATOCRIT: 29 % — AB (ref 36.0–46.0)
HEMOGLOBIN: 9.5 g/dL — AB (ref 12.0–15.0)
MCH: 28.8 pg (ref 26.0–34.0)
MCHC: 32.8 g/dL (ref 30.0–36.0)
MCV: 87.9 fL (ref 78.0–100.0)
Platelets: 325 10*3/uL (ref 150–400)
RBC: 3.3 MIL/uL — ABNORMAL LOW (ref 3.87–5.11)
RDW: 16.8 % — AB (ref 11.5–15.5)
WBC: 11.6 10*3/uL — ABNORMAL HIGH (ref 4.0–10.5)

## 2013-10-23 MED ORDER — HYDRALAZINE HCL 25 MG PO TABS
25.0000 mg | ORAL_TABLET | Freq: Three times a day (TID) | ORAL | Status: DC
  Administered 2013-10-23 – 2013-10-27 (×13): 25 mg via ORAL
  Filled 2013-10-23 (×15): qty 1

## 2013-10-23 MED ORDER — BIOTENE DRY MOUTH MT LIQD
15.0000 mL | Freq: Two times a day (BID) | OROMUCOSAL | Status: DC
  Administered 2013-10-23 – 2013-10-27 (×9): 15 mL via OROMUCOSAL

## 2013-10-23 MED ORDER — FUROSEMIDE 10 MG/ML IJ SOLN
40.0000 mg | Freq: Once | INTRAMUSCULAR | Status: AC
  Administered 2013-10-23: 40 mg via INTRAVENOUS
  Filled 2013-10-23: qty 4

## 2013-10-23 MED ORDER — SODIUM CHLORIDE 0.9 % IV SOLN
INTRAVENOUS | Status: DC
  Administered 2013-10-23: 10:00:00 via INTRAVENOUS

## 2013-10-23 MED ORDER — WARFARIN SODIUM 7.5 MG PO TABS
7.5000 mg | ORAL_TABLET | Freq: Once | ORAL | Status: AC
  Administered 2013-10-23: 7.5 mg via ORAL
  Filled 2013-10-23: qty 1

## 2013-10-23 NOTE — Progress Notes (Signed)
1339 Came to offer to walk with pt. She stated too tired and had too much stuff on her. Offered to help with stuff but pt stated would try to walk tomorrow. Appears very sleepy. Will continue to follow. Luetta Nutting RN BSN 10/23/2013 1:41 PM

## 2013-10-23 NOTE — Progress Notes (Signed)
PT Cancellation Note  Patient Details Name: Kaitlin [name] MRN: [id number] DOB: [birth year]   Cancelled Treatment:    Reason Eval/Treat Not Completed: Fatigue limiting ability to participate (Pt meeting with palliative earlier and now too tired per cardiac rehab note.)   [facility] 10/23/2013, 2:33 PM

## 2013-10-23 NOTE — Progress Notes (Signed)
ANTICOAGULATION CONSULT NOTE - Follow Up Consult  Pharmacy Consult for Coumadin Indication: LVAD  Allergies  Allergen Reactions  . Xopenex [Levalbuterol Hcl]     Rash in mouth    Patient Measurements: Wt = 132lb Ht = 160cm  Vital Signs: Temp: 99 F (37.2 C) (06/23 0804) Temp src: Oral (06/23 0804) Pulse Rate: 65 (06/23 0804)  Labs:  Recent Labs  10/21/13 0411 10/22/13 0235 10/23/13 0300  HGB  --  9.4* 9.5*  HCT  --  28.8* 29.0*  PLT  --  314 325  LABPROT 30.1* 23.9* 22.2*  INR 3.00* 2.22* 2.02*  CREATININE 2.03* 2.08* 2.04*    Estimated Creatinine Clearance: 22.7 ml/min (by C-G formula based on Cr of 2.04).  Assessment: 67yof continues on coumadin for LVAD (12/2012). Coumadin doses held 6/19 and 6/20 due to elevated INR and then resumed 6/21. INR is therapeutic today but is trending down.  She continues on IV amiodarone for recurrent VT.  Home dose 2.5mg  daily except 5mg  MWF  Goal of Therapy:  INR 2-3 Monitor platelets by anticoagulation protocol: Yes   Plan:  1) Increase coumadin to 7.5mg  x 1 2) INR in AM  Fredrik Rigger 10/23/2013,9:11 AM

## 2013-10-23 NOTE — Progress Notes (Signed)
Patient ID: Kaitlin [name], female   DOB: June 07, 1945, 68 y.o.   MRN: [id number] Advanced Heart Failure Rounding Note   Subjective:    Kaitlin [name] is a 68 y/o woman with h/o severe biventricular HF due to NICM s/p HM II LVAD implant (12/2012), VT s/ Biotronik ICD, CKD (Baseline Cr 1.5-1.6) and RHF. She is being admitted from [facility] on 6/17 for recurrent RHF.   8/18 Underwent HM II LVAD implant (under destination criteria) along with TV repair (tricuspid annuloplasty). Post-op course c/b RHF requiring take back to the OR for bleeding and temporay RVAD support. Subsequently started on dobutamine for RV support.   Admitted in early June for recurrent VT in setting of hypokalemia which was complicated by  recurrent RHF. Dobutamine increased to 4 mcg/kg/min. RAMP ECHO performed and speed turned down to 9200 to hopefully allow RV to keep up.   Admitted from HF clinic with fatigue and dyspnea on minimal exertion. Dobutamine was increased to 5 mcg and diuretics held due to worsening renal functions. On admit VAD speed turned down to 9000 due RHF.   Friday started back on torsemide. I/O even yesterday, last CVP > 20 but PICC out now.  Co-ox has been poor for the last few days, PICC came out. Yesterday, it was 42% and 39% when repeated.  LVAD speed was turned down to 8800 then back up to 9200.   6/21, patient went into VT with rate in the 150s and was sedated with Versed 3 mg IV and Fentanyl 100 mcg IV and DCCV at 150 J with resumption of paced rhythm.  Patient went into VT at 150 bpm again 6/22 and DCCV at 150 J.    CO-OX 44%> 37>53%  >28%>42%/39%>PICC out Creatinine 2.1> 1.7>1.5  >1.8 >2.0 >2.0 INR 2.02   LVAD interrogation reveals:  Speed: 9200  Flow: 4.0 Power: 4.9 PI: 4.9 Alarms: none  Events: 25 PI events in 24 hours  Objective:   Weight Range:  Vital Signs:   Temp:  [97.9 F (36.6 C)-99 F (37.2 C)] 99 F (37.2 C) (06/23 0804) Pulse Rate:  [57-121] 65 (06/23 0804) Resp:   [17-24] 19 (06/23 0804) SpO2:  [90 %-99 %] 94 % (06/23 0804) Weight:  [132 lb 11.5 oz (60.2 kg)] 132 lb 11.5 oz (60.2 kg) (06/23 0405) Last BM Date: 10/21/13  Weight change: Filed Weights   10/21/13 0300 10/22/13 0500 10/23/13 0405  Weight: 134 lb 4.2 oz (60.9 kg) 134 lb 4.2 oz (60.9 kg) 132 lb 11.5 oz (60.2 kg)    Intake/Output:   Intake/Output Summary (Last 24 hours) at 10/23/13 0910 Last data filed at 10/23/13 0700  Gross per 24 hour  Intake    813 ml  Output   1850 ml  Net  -1037 ml     Physical Exam: General: No resp difficulty, lying flat  in bed =, fatigued HEENT: normal,  Neck: supple. JVP ear with prominent CV waves . Carotids 2+ bilat; no bruits. No lymphadenopathy or thryomegaly appreciated.  Cor: Mechanical heart sounds with LVAD hum present.  Lungs: clear  Abdomen: nontender, + distended. No hepatosplenomegaly. No bruits or masses. Good bowel sounds.  Driveline: C/D/I; securement device intact and driveline incorporated  Extremities: no cyanosis, clubbing, rash, edema,  Neuro: alert & orientedx3, cranial nerves grossly intact. moves all 4 extremities w/o difficulty. Affect pleasant   Telemetry: V-paced 60s  Labs: Basic Metabolic Panel:  Recent Labs Lab 10/19/13 0355 10/20/13 0500 10/21/13 0411 10/21/13 0830 10/22/13 0235  10/23/13 0300  NA 133* 135* 133*  --  130* 133*  K 4.5 4.4 4.1  --  4.0 3.8  CL 95* 96 93*  --  91* 92*  CO2 $Re'24 25 22  'VUK$ --  20 23  GLUCOSE 116* 113* 118*  --  106* 107*  BUN 35* 37* 40*  --  39* 36*  CREATININE 1.52* 1.76* 2.03*  --  2.08* 2.04*  CALCIUM 9.9 9.8 10.3  --  10.0 10.0  MG  --   --   --  2.2  --   --     Liver Function Tests:  Recent Labs Lab 10/02/2013 1329  AST 32  ALT 29  ALKPHOS 178*  BILITOT 2.6*  PROT 7.4  ALBUMIN 4.2   No results found for this basename: LIPASE, AMYLASE,  in the last 168 hours No results found for this basename: AMMONIA,  in the last 168 hours  CBC:  Recent Labs Lab 10/23/2013 1329  10/22/13 0235 10/23/13 0300  WBC 9.9 12.1* 11.6*  HGB 9.6* 9.4* 9.5*  HCT 29.0* 28.8* 29.0*  MCV 88.4 87.5 87.9  PLT 303 314 325    Cardiac Enzymes: No results found for this basename: CKTOTAL, CKMB, CKMBINDEX, TROPONINI,  in the last 168 hours  BNP: BNP (last 3 results)  Recent Labs  10/03/13 1306 10/10/13 2001 10/25/2013 1329  PROBNP 1462.0* 1759.0* 3826.0*     Other results:    Imaging: No results found.   Medications:     Scheduled Medications: . amLODipine  10 mg Oral Daily  . citalopram  20 mg Oral Daily  . docusate sodium  100 mg Oral Daily  . ferrous sulfate  325 mg Oral Q breakfast  . magnesium oxide  200 mg Oral Daily  . mexiletine  150 mg Oral 3 times per day  . multivitamins with iron  1 tablet Oral Daily  . pantoprazole  40 mg Oral Daily  . potassium chloride  20 mEq Oral Daily  . sildenafil  20 mg Oral TID  . simvastatin  10 mg Oral q1800  . spironolactone  25 mg Oral BID  . torsemide  20 mg Oral BID  . Warfarin - Pharmacist Dosing Inpatient   Does not apply q1800    Infusions: . amiodarone 30 mg/hr (10/23/13 0837)  . DOBUTamine 5 mcg/kg/min (10/21/13 2026)    PRN Medications: acetaminophen, bisacodyl, hydrALAZINE, traMADol, zolpidem   Assessment:   1. 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 Chronic Systolic HF s/p HMII VAD placement  3. Acute on chronic renal failure, stage III-IV  4. Restrictive lung disease  5. H/o VT: Most recently VT am 6/22.    Plan/Discussion:    She continues to struggle with profound RHF. Co-ox has remained low and patient has had 2 episodes of VT. She is now on Amiodarone and mexiletine. Will continue amio gtt one more day and then transtion to PO. V paced 60.   MAPs 90-115s will start hydralazine 25 mg TID to help with afterload reduction. She will not be on Imdur with Revatio. She is having multiple PI events over 25 in the past 24 hours, may need to turn speed back down to 9000 but will hold off  for now and will see if better MAP control helps.  Patient has only a VF zone (220) on her ICD.  No ATP as has accelerated VT in the past.   Volume status appears elevated and abdomen distended will give one dose of  IV lasix 40 mg IV with continued torsemide.  Dr. Haroldine Laws had lenghty discussion with patient and family yesterday about prognosis. They are to meet with Palliative care today. Kaitlin [name] told me she would like to try with dobutamine again at home one more time. Will replace PICC if decision remains the same after Palliative meeting.   Continue to ambulate.   I reviewed the LVAD parameters from today, and compared the results to the patient's prior recorded data. No programming changes were made. The LVAD is functioning within specified parameters. The patient performs LVAD self-test daily. LVAD interrogation was negative for any significant power changes, alarms or PI events/speed drops. LVAD equipment check completed and is in good working order. Back-up equipment present. LVAD education done on emergency procedures and precautions and reviewed exit site care.  Rande Brunt, NP-C 9:10 AM 10/23/2013  VAD Team --- VAD ISSUES ONLY---  Pager [phone] (7am - 7am)   Advanced Heart Failure Team  Pager [phone] (M-F; 7a - 4p)  Please contact [facility] for night-coverage after hours (4p -7a ) and weekends on [email or web address]  Patient seen and examined with Junie Bame, NP. We discussed all aspects of the encounter. I agree with the assessment and plan as stated above.  Feels weak today. Poor diuresis despite IV lasix. Has met with palliative Care team and wants to try home dobutamine again preferably with Home Hospice support. VT suppressed on mexilitene.  Will consider turning V-pacing up to 70-75 bpm. IR to place PICC.   Multiple PI events on VAD.   Daniel Bensimhon,MD 4:11 PM

## 2013-10-23 NOTE — Consult Note (Signed)
Patient Kaitlin [name]      DOB: 15-Mar-1946      GEX:[id number]     Consult Note from the Palliative Medicine Team at [facility]    Consult Requested by: Dr Kaitlin [name]     PCP: Kaitlin Courier, MD Reason for Consultation: Clarification of GOC and options     Phone [phone]  Assessment of patients Current state:   Kaitlin [name] is a 68 y/o woman with h/o severe biventricular HF due to NICM s/p HM II LVAD implant (12/2012), VT s/ Biotronik ICD, CKD (Baseline Cr 1.5-1.6) and RHF. Admitted from HF Clinic on 6/17 for recurrent RHF.   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 Patient is faced with advanced directive decisions and anticipatory care needs   This NP Kaitlin [name] reviewed medical records, received report from team, assessed the patient and then meet at the patient's bedside along with her daughter  Kaitlin [name] to discuss diagnosis prognosis, GOC, EOL wishes disposition and options.  A detailed discussion was had today regarding advanced directives.  Concepts specific to code status, and rehospitalization was had.  The difference between a aggressive medical intervention path  and a palliative comfort care path for this patient at this time was had.  Values and goals of care important to patient and family were attempted to be elicited.  Concept of Hospice and Palliative Care were discussed  Natural trajectory and expectations at EOL were discussed.  Questions and concerns addressed.   Family encouraged to call with questions or concerns.  PMT will continue to support holistically.   Goals of Care: 1.  Code Status: Partial    2. Scope of Treatment:  At this time patient is open to continued medical interventions, specific to IV medications at home, to prolong quality life.  3. Disposition:  Patient's main desire is to be at home, "this is where I am happy"  She is open to hospice  services if she meets criteria of local hospice (waiting for call back)     4. Symptom Management:   1. Weakness:  Continue medical management of chronic disease   5. Psychosocial:   Emotional support offered to patient and her daughter.  Both understand the limited prognosis and the reality of the limitations of the LVAD pump.  Kaitlin [name] tells me "I have been preparing for this since the beginning since I had the LVAD put in, I knew there was an endpoint".   Her daughter states, " I understand and I am comfortable, I only want what is best for my mother and my concern is for her comfort"   Brief HPI: Per Kaitlin Potash NP note: Kaitlin [name] is a 68 y/o woman with h/o severe biventricular HF due to NICM s/p HM II LVAD implant (12/2012), VT s/ Biotronik ICD, CKD (Baseline Cr 1.5-1.6) and RHF. She is being admitted from HF Clinic on 6/17 for recurrent RHF.  8/18 Underwent HM II LVAD implant (under destination criteria) along with TV repair (tricuspid annuloplasty). Post-op course c/b RHF requiring take back to the OR for bleeding and temporay RVAD support. Subsequently started on dobutamine for RV support.  Admitted in early June for recurrent VT in setting of hypokalemia which was complicated by recurrent RHF. Dobutamine increased to 4 mcg/kg/min. RAMP ECHO performed and speed turned down to 9200 to hopefully allow RV to keep up.  Admitted from HF clinic with fatigue and dyspnea on  minimal exertion. Dobutamine was increased to 5 mcg and diuretics held due to worsening renal functions. On admit VAD speed turned down to 9000 due RHF.  Friday started back on torsemide. I/O even yesterday, last CVP > 20 but PICC out now. Co-ox has been poor for the last few days, PICC came out. Yesterday, it was 42% and 39% when repeated. LVAD speed was turned down to 8800 then back up to 9200.  6/21, patient went into VT with rate in the 150s and was sedated with Versed 3 mg IV and Fentanyl 100 mcg IV and DCCV at 150 J  with resumption of paced rhythm. Patient went into VT at 150 bpm again 6/22 and DCCV at 150 J.   Limited medical treatment options to treat heart failure.  ROS:  Weakness, fatigue   PMH:  Past Medical History  Diagnosis Date  . Paroxysmal supraventricular tachycardia   . Hypokalemia   . Chronic systolic heart failure     a. 08/979 Echo: EF 15%, mod to sev antlat and inflat HK, inf AK, mild to mod MR, mildly/mod reduced RV fxn, Mod TR, PASP .  [name] History of DVT (deep vein thrombosis)   . Dyslipidemia   . HTN (hypertension)   . Nonischemic cardiomyopathy     a. 06/2012 Echo: EF 15%;  b. 06/2012 Cath: nl except luminal irregs in LAD.  [name] Ventricular tachycardia     a. s/p ICD  . Goiter 2001  . Implantable cardiac defibrillator- Biotronik 2009    Single chamber  . [name] (left bundle branch block)     intermittent  . Palliative care patient   . Automatic implantable cardioverter-defibrillator in situ   . CHF (congestive heart failure)      PSH: Past Surgical History  Procedure Laterality Date  . None    . Cardiac defibrillator placement  01/2009  . Insertion of implantable left ventricular assist device N/A 12/18/2012    Procedure: INSERTION OF IMPLANTABLE LEFT VENTRICULAR ASSIST DEVICE;  Surgeon: Kaitlin Perna, MD;  Location: [facility] OR;  Service: Open Heart Surgery;  Laterality: N/A;  . Intraoperative transesophageal echocardiogram N/A 12/18/2012    Procedure: INTRAOPERATIVE TRANSESOPHAGEAL ECHOCARDIOGRAM;  Surgeon: Kaitlin Perna, MD;  Location: [facility] OR;  Service: Open Heart Surgery;  Laterality: N/A;  . Tricuspid valve replacement N/A 12/18/2012    Procedure: TRICUSPID VALVE REPAIR;  Surgeon: Kaitlin Perna, MD;  Location: [facility] OR;  Service: Open Heart Surgery;  Laterality: N/A;  . Insertion of implantable left ventricular assist device N/A 12/18/2012    Procedure: INSERTION OF IMPLANTABLE RIGHT VENTRICULAR ASSIST DEVICE;  Surgeon: Kaitlin Perna, MD;  Location: [facility] OR;  Service:  Open Heart Surgery;  Laterality: N/A;  . Removal of centrimag ventricular assist device N/A 12/25/2012    Procedure: REMOVAL OF CENTRIMAG VENTRICULAR ASSIST DEVICE;  Surgeon: Kaitlin Perna, MD;  Location: [facility] OR;  Service: Open Heart Surgery;  Laterality: N/A;  PUMP STANDBY  . Intraoperative transesophageal echocardiogram N/A 12/25/2012    Procedure: INTRAOPERATIVE TRANSESOPHAGEAL ECHOCARDIOGRAM;  Surgeon: Kaitlin Perna, MD;  Location: [facility] OR;  Service: Open Heart Surgery;  Laterality: N/A;  . Esophagogastroduodenoscopy N/A 12/26/2012    Procedure: ESOPHAGOGASTRODUODENOSCOPY (EGD);  Surgeon: Beverley Fiedler, MD;  Location: [facility] ENDOSCOPY;  Service: Gastroenterology;  Laterality: N/A;  Bedside  . Sternal closure N/A 12/27/2012    Procedure: STERNAL CLOSURE;  Surgeon: Kaitlin Perna, MD;  Location: [facility] OR;  Service: Thoracic;  Laterality: N/A;  . Mediastinal exploration N/A  12/27/2012    Procedure: MEDIASTINAL EXPLORATION;  Surgeon: Kaitlin Perna, MD;  Location: [facility] OR;  Service: Thoracic;  Laterality: N/A;  . Cardiac catheterization     I have reviewed the FH and SH and  If appropriate update it with new information. Allergies  Allergen Reactions  . Xopenex [Levalbuterol Hcl]     Rash in mouth   Scheduled Meds: . amLODipine  10 mg Oral Daily  . antiseptic oral rinse  15 mL Mouth Rinse BID  . citalopram  20 mg Oral Daily  . docusate sodium  100 mg Oral Daily  . ferrous sulfate  325 mg Oral Q breakfast  . hydrALAZINE  25 mg Oral TID  . magnesium oxide  200 mg Oral Daily  . mexiletine  150 mg Oral 3 times per day  . multivitamins with iron  1 tablet Oral Daily  . pantoprazole  40 mg Oral Daily  . potassium chloride  20 mEq Oral Daily  . sildenafil  20 mg Oral TID  . simvastatin  10 mg Oral q1800  . spironolactone  25 mg Oral BID  . torsemide  20 mg Oral BID  . warfarin  7.5 mg Oral ONCE-1800  . Warfarin - Pharmacist Dosing Inpatient   Does not apply q1800   Continuous Infusions: . sodium  chloride 5 mL/hr at 10/23/13 1001  . amiodarone 30 mg/hr (10/23/13 0837)  . DOBUTamine 5 mcg/kg/min (10/23/13 0800)   PRN Meds:.acetaminophen, bisacodyl, hydrALAZINE, traMADol, zolpidem    BP 154/61  Pulse 60  Temp(Src) 99.1 F (37.3 C) (Oral)  Resp 18  Ht 5\' 3"  (1.6 m)  Wt 60.2 kg (132 lb 11.5 oz)  BMI 23.52 kg/m2  SpO2 95%   PPS:30 %   Intake/Output Summary (Last 24 hours) at 10/23/13 1452 Last data filed at 10/23/13 1438  Gross per 24 hour  Intake   1621 ml  Output   1650 ml  Net    -29 ml     Physical Exam:  General: chronically ill appearing, fatigued, NAD HEENT:  Moist buccal membranes, no exudate Chest:   CTA CVS: noted LVAD hum Abdomen: minimal distention, NT, +BS noted drive line CDI Ext: without edema Neuro:alert and oriented X3, engaged in this conversation  Labs: CBC    Component Value Date/Time   WBC 11.6* 10/23/2013 0300   RBC 3.30* 10/23/2013 0300   HGB 9.5* 10/23/2013 0300   HCT 29.0* 10/23/2013 0300   PLT 325 10/23/2013 0300   MCV 87.9 10/23/2013 0300   MCH 28.8 10/23/2013 0300   MCHC 32.8 10/23/2013 0300   RDW 16.8* 10/23/2013 0300   LYMPHSABS 0.8 09/15/2013 2204   MONOABS 0.4 09/15/2013 2204   EOSABS 0.1 09/15/2013 2204   BASOSABS 0.0 09/15/2013 2204    BMET    Component Value Date/Time   NA 133* 10/23/2013 0300   K 3.8 10/23/2013 0300   CL 92* 10/23/2013 0300   CO2 23 10/23/2013 0300   GLUCOSE 107* 10/23/2013 0300   BUN 36* 10/23/2013 0300   CREATININE 2.04* 10/23/2013 0300   CALCIUM 10.0 10/23/2013 0300   GFRNONAA 24* 10/23/2013 0300   GFRAA 28* 10/23/2013 0300    CMP     Component Value Date/Time   NA 133* 10/23/2013 0300   K 3.8 10/23/2013 0300   CL 92* 10/23/2013 0300   CO2 23 10/23/2013 0300   GLUCOSE 107* 10/23/2013 0300   BUN 36* 10/23/2013 0300   CREATININE 2.04* 10/23/2013 0300   CALCIUM 10.0  10/23/2013 0300   PROT 7.4 02-13-2014 1329   ALBUMIN 4.2 02-13-2014 1329   AST 32 02-13-2014 1329   ALT 29 02-13-2014 1329   ALKPHOS 178*  02-13-2014 1329   BILITOT 2.6* 02-13-2014 1329   GFRNONAA 24* 10/23/2013 0300   GFRAA 28* 10/23/2013 0300    Time In Time Out Total Time Spent with Patient Total Overall Time  1000 1120 75 min 80 min    Greater than 50%  of this time was spent counseling and coordinating care related to the above assessment and plan.   Kaitlin CreedMary Larach NP  Palliative Medicine Team Team Phone # [phone] Pager [phone]  Discussed with Kaitlin PotashAli Cosgrove and Dr [facility]

## 2013-10-24 ENCOUNTER — Inpatient Hospital Stay (HOSPITAL_COMMUNITY): Payer: Medicare HMO

## 2013-10-24 LAB — PROTIME-INR
INR: 2.53 — ABNORMAL HIGH (ref 0.00–1.49)
Prothrombin Time: 26.4 seconds — ABNORMAL HIGH (ref 11.6–15.2)

## 2013-10-24 LAB — BASIC METABOLIC PANEL
BUN: 37 mg/dL — AB (ref 6–23)
CHLORIDE: 92 meq/L — AB (ref 96–112)
CO2: 23 mEq/L (ref 19–32)
CREATININE: 2.48 mg/dL — AB (ref 0.50–1.10)
Calcium: 10 mg/dL (ref 8.4–10.5)
GFR calc Af Amer: 22 mL/min — ABNORMAL LOW (ref >90)
GFR calc non Af Amer: 19 mL/min — ABNORMAL LOW (ref >90)
GLUCOSE: 113 mg/dL — AB (ref 70–99)
POTASSIUM: 4.1 meq/L (ref 3.7–5.3)
Sodium: 132 mEq/L — ABNORMAL LOW (ref 137–147)

## 2013-10-24 MED ORDER — METOLAZONE 2.5 MG PO TABS
2.5000 mg | ORAL_TABLET | Freq: Once | ORAL | Status: AC
  Administered 2013-10-24: 2.5 mg via ORAL
  Filled 2013-10-24: qty 1

## 2013-10-24 MED ORDER — WARFARIN SODIUM 5 MG PO TABS
5.0000 mg | ORAL_TABLET | Freq: Once | ORAL | Status: AC
  Administered 2013-10-24: 5 mg via ORAL
  Filled 2013-10-24: qty 1

## 2013-10-24 MED ORDER — AMIODARONE HCL 200 MG PO TABS
200.0000 mg | ORAL_TABLET | Freq: Two times a day (BID) | ORAL | Status: DC
  Administered 2013-10-24 – 2013-10-27 (×7): 200 mg via ORAL
  Filled 2013-10-24 (×8): qty 1

## 2013-10-24 MED ORDER — ONDANSETRON HCL 4 MG/2ML IJ SOLN
4.0000 mg | Freq: Four times a day (QID) | INTRAMUSCULAR | Status: DC | PRN
  Administered 2013-10-24 – 2013-10-26 (×5): 4 mg via INTRAVENOUS
  Filled 2013-10-24 (×5): qty 2

## 2013-10-24 MED ORDER — ONDANSETRON HCL 4 MG PO TABS
4.0000 mg | ORAL_TABLET | Freq: Two times a day (BID) | ORAL | Status: DC
  Administered 2013-10-24 – 2013-10-27 (×7): 4 mg via ORAL
  Filled 2013-10-24 (×9): qty 1

## 2013-10-24 MED ORDER — FUROSEMIDE 10 MG/ML IJ SOLN
80.0000 mg | Freq: Once | INTRAMUSCULAR | Status: AC
  Administered 2013-10-24: 80 mg via INTRAVENOUS
  Filled 2013-10-24: qty 8

## 2013-10-24 NOTE — Progress Notes (Signed)
Patient ID: Kaitlin [name], female   DOB: 1945/09/19, 68 y.o.   MRN: [id number] Advanced Heart Failure Rounding Note   Subjective:    Kaitlin [name] is a 68 y/o woman with h/o severe biventricular HF due to NICM s/p HM II LVAD implant (12/2012), VT s/ Biotronik ICD, CKD (Baseline Cr 1.5-1.6) and RHF. She is being admitted from HF Clinic on 6/17 for recurrent RHF.   8/18 Underwent HM II LVAD implant (under destination criteria) along with TV repair (tricuspid annuloplasty). Post-op course c/b RHF requiring take back to the OR for bleeding and temporay RVAD support. Subsequently started on dobutamine for RV support.   Admitted in early June for recurrent VT in setting of hypokalemia which was complicated by  recurrent RHF. Dobutamine increased to 4 mcg/kg/min. RAMP ECHO performed and speed turned down to 9200 to hopefully allow RV to keep up.   Admitted from HF clinic with fatigue and dyspnea on minimal exertion. Dobutamine was increased to 5 mcg and diuretics held due to worsening renal functions. On admit VAD speed turned down to 9000 due RHF.   6/21, patient went into VT with rate in the 150s and was sedated with Versed 3 mg IV and Fentanyl 100 mcg IV and DCCV at 150 J with resumption of paced rhythm.  Patient went into VT at 150 bpm again 6/22 and DCCV at 150 J.   She received 40 mg IV lasix yesterday and she had minimal UOP. Creatinine trending up. Last CVP was greater than 20, but pending PICC through IR. Co-ox has been poor and LVAD speed was increased back to 9200. Lengthy discussion with Palliative Care and Dr. Gala Romney and she would like to go home with Hospice with dobutmaine if they will allow. Very fatigued. Denies CP. +SOB.  CO-OX 44%> 37>53%  >28%>42%/39%>PICC out Creatinine 2.1> 1.7>1.5  >1.8 >2.0 >2.0>2.48 INR 2.53   LVAD interrogation reveals:  Speed: 9200  Flow: 4.5 Power: 5.1 PI: 5.2 Alarms: none  Events: no events since 1800 last night but before was 1-4 PI events  every hour  Objective:   Weight Range:  Vital Signs:   Temp:  [98.2 F (36.8 C)-99.1 F (37.3 C)] 98.2 F (36.8 C) (06/24 0326) Pulse Rate:  [60-70] 64 (06/24 0400) Resp:  [18-27] 20 (06/24 0400) SpO2:  [92 %-96 %] 92 % (06/24 0400) Weight:  [132 lb 11.5 oz (60.2 kg)] 132 lb 11.5 oz (60.2 kg) (06/24 0300) Last BM Date: 10/21/13  Weight change: Filed Weights   10/22/13 0500 10/23/13 0405 10/24/13 0300  Weight: 134 lb 4.2 oz (60.9 kg) 132 lb 11.5 oz (60.2 kg) 132 lb 11.5 oz (60.2 kg)    Intake/Output:   Intake/Output Summary (Last 24 hours) at 10/24/13 0741 Last data filed at 10/24/13 0600  Gross per 24 hour  Intake 1679.5 ml  Output   1000 ml  Net  679.5 ml     Physical Exam: General: No resp difficulty, lying flat  in bed =, fatigued HEENT: normal,  Neck: supple. JVP ear with prominent CV waves . Carotids 2+ bilat; no bruits. No lymphadenopathy or thryomegaly appreciated.  Cor: Mechanical heart sounds with LVAD hum present.  Lungs: clear  Abdomen: nontender, + distended. No hepatosplenomegaly. No bruits or masses. Good bowel sounds.  Driveline: C/D/I; securement device intact and driveline incorporated  Extremities: no cyanosis, clubbing, rash, edema,  Neuro: alert & orientedx3, cranial nerves grossly intact. moves all 4 extremities w/o difficulty. Affect pleasant   Telemetry: V-paced 60s  Labs: Basic Metabolic Panel:  Recent Labs Lab 10/20/13 0500 10/21/13 0411 10/21/13 0830 10/22/13 0235 10/23/13 0300 10/24/13 0300  NA 135* 133*  --  130* 133* 132*  K 4.4 4.1  --  4.0 3.8 4.1  CL 96 93*  --  91* 92* 92*  CO2 25 22  --  20 23 23   GLUCOSE 113* 118*  --  106* 107* 113*  BUN 37* 40*  --  39* 36* 37*  CREATININE 1.76* 2.03*  --  2.08* 2.04* 2.48*  CALCIUM 9.8 10.3  --  10.0 10.0 10.0  MG  --   --  2.2  --   --   --     Liver Function Tests:  Recent Labs Lab 10/21/2013 1329  AST 32  ALT 29  ALKPHOS 178*  BILITOT 2.6*  PROT 7.4  ALBUMIN 4.2   No  results found for this basename: LIPASE, AMYLASE,  in the last 168 hours No results found for this basename: AMMONIA,  in the last 168 hours  CBC:  Recent Labs Lab 10/09/2013 1329 10/22/13 0235 10/23/13 0300  WBC 9.9 12.1* 11.6*  HGB 9.6* 9.4* 9.5*  HCT 29.0* 28.8* 29.0*  MCV 88.4 87.5 87.9  PLT 303 314 325    Cardiac Enzymes: No results found for this basename: CKTOTAL, CKMB, CKMBINDEX, TROPONINI,  in the last 168 hours  BNP: BNP (last 3 results)  Recent Labs  10/03/13 1306 10/10/13 2001 10/20/2013 1329  PROBNP 1462.0* 1759.0* 3826.0*     Other results:    Imaging: No results found.   Medications:     Scheduled Medications: . amLODipine  10 mg Oral Daily  . antiseptic oral rinse  15 mL Mouth Rinse BID  . citalopram  20 mg Oral Daily  . docusate sodium  100 mg Oral Daily  . ferrous sulfate  325 mg Oral Q breakfast  . hydrALAZINE  25 mg Oral TID  . magnesium oxide  200 mg Oral Daily  . mexiletine  150 mg Oral 3 times per day  . multivitamins with iron  1 tablet Oral Daily  . ondansetron  4 mg Oral Q12H  . pantoprazole  40 mg Oral Daily  . potassium chloride  20 mEq Oral Daily  . sildenafil  20 mg Oral TID  . simvastatin  10 mg Oral q1800  . spironolactone  25 mg Oral BID  . torsemide  20 mg Oral BID  . Warfarin - Pharmacist Dosing Inpatient   Does not apply q1800    Infusions: . sodium chloride 5 mL/hr at 10/23/13 1001  . amiodarone 30 mg/hr (10/24/13 0600)  . DOBUTamine 5 mcg/kg/min (10/24/13 0600)    PRN Medications: acetaminophen, bisacodyl, hydrALAZINE, ondansetron (ZOFRAN) IV, traMADol, zolpidem   Assessment:   1. Right heart failure  2. Cardiogenic shock 3. Chronic Systolic HF s/p HMII VAD placement  4. Acute on chronic renal failure, stage III-IV  5. Restrictive lung disease  6. H/o VT: Most recently VT am 6/22   Plan/Discussion:    She continues to struggle with profound RHF. Co-ox has remained low and patient has had 2 episodes  of VT. She is now on Amiodarone and mexiletine. Awaiting PICC line placement through IR and then will get CVP and co-ox. Continue dobutamine 5 mcg/kg/min. Concerned with nausea that she is continuing to have low output symptoms, but could also be related to the amiodarone. Will stop amiodarone gtt and change to 200 mg PO BID.   MAPs more  controlled 80-90s.will continue hydralazine 25 mg TID. She will not be on Imdur with Revatio. Her PI events have improved with better BP control.    Patient has only a VF zone (220) on her ICD.  No ATP as has accelerated VT in the past.   Volume status appears elevated and abdomen remains distended. She did not have much UOP with IV lasix yesterday. Will give one dose of IV lasix 80 mg and a dose of 2.5 mg of metolazone. Will also increase speed on pacemaker.   INR therapeutic. Will check LDH tomorrow.   Have a call in to Hospice to see if they will accept her along with her Dobutamine. Do not think that she will make it much longer with her RV failure. Patient is adamant she would like to go home.   Continue to ambulate with PT and CR.   I reviewed the LVAD parameters from today, and compared the results to the patient's prior recorded data. No programming changes were made. The LVAD is functioning within specified parameters. The patient performs LVAD self-test daily. LVAD interrogation was negative for any significant power changes, alarms or PI events/speed drops. LVAD equipment check completed and is in good working order. Back-up equipment present. LVAD education done on emergency procedures and precautions and reviewed exit site care.  Aundria Rudosgrove, Ali B, NP-C 7:41 AM 10/24/2013  VAD Team --- VAD ISSUES ONLY---  Pager [phone] (7am - 7am)   Advanced Heart Failure Team  Pager [phone] (M-F; 7a - 4p)  Please contact [facility] for night-coverage after hours (4p -7a ) and weekends on [email or web address]  Patient seen and examined with Ulla PotashAli Cosgrove, NP. We  discussed all aspects of the encounter. I agree with the assessment and plan as stated above.   RHF failure getting worse. More bloated with poor response to lasix. Renal function deteriorating. Will try IV lasix again today with metolazone. PICC to be placed and can follow up on co-ox. May be nearing time for comfort care. Given overall situation, I don't feel like there is much of a role for adding dopamine.   PI events decreased overnight.   Daniel Bensimhon,MD 8:12 AM

## 2013-10-24 NOTE — Progress Notes (Signed)
Physical Therapy Treatment Patient Details Name: Kaitlin [name] MRN: [id number] DOB: [birth year] Today's Date: 2013-10-28    History of Present Illness Pt is a 68 y/o female with h/o severe biventricular HF due to NICM s/p HM II LVAD implant (12/2012), VT s/ Biotronik ICD, CKD (Baseline Cr 1.5-1.6) and RHF. She is being admitted from HF Clinic on 6/17 for recurrent RHF.     PT Comments    Pt with good effort but fatigues very quickly.  Follow Up Recommendations  No PT follow up;Supervision/Assistance - 24 hour     Equipment Recommendations  None recommended by PT    Recommendations for Other Services       Precautions / Restrictions Precautions Precautions: Fall Precaution Comments: LVAD Required Braces or Orthoses: Other Brace/Splint Other Brace/Splint: LVAD equipment    Mobility  Bed Mobility Overal bed mobility: Needs Assistance Bed Mobility: Supine to Sit     Supine to sit: Supervision     General bed mobility comments: Primarily for lines/tubes  Transfers Overall transfer level: Needs assistance Equipment used: 4-wheeled walker Transfers: Sit to/from Stand Sit to Stand: Min guard         General transfer comment: Assist for safety.  Ambulation/Gait Ambulation/Gait assistance: Min guard Ambulation Distance (Feet): 60 Feet (60' x 1, 30' x 1) Assistive device: 4-wheeled walker Gait Pattern/deviations: Step-through pattern;Decreased step length - right;Decreased step length - left Gait velocity: Decreased Gait velocity interpretation: Below normal speed for age/gender General Gait Details: Pt fatigues quickly and took 1 seated rest break before amb further.    Stairs            Wheelchair Mobility    Modified Rankin (Stroke Patients Only)       Balance                                    Cognition Arousal/Alertness: Awake/alert Behavior During Therapy: WFL for tasks assessed/performed Overall Cognitive Status: Within  Functional Limits for tasks assessed                      Exercises      General Comments        Pertinent Vitals/Pain VSS    Home Living                      Prior Function            PT Goals (current goals can now be found in the care plan section) Progress towards PT goals: Progressing toward goals    Frequency  Min 2X/week    PT Plan Discharge plan needs to be updated;Frequency needs to be updated    Co-evaluation             End of Session Equipment Utilized During Treatment: Oxygen (LVAD equipment) Activity Tolerance: Patient limited by fatigue Patient left: in chair;with call bell/phone within reach;with family/visitor present     Time: 1219-1258 PT Time Calculation (min): 39 min  Charges:  $Gait Training: 38-52 mins                    G Codes:      Hind Chesler 28-Oct-2013, 1:53 PM  [employer] PT [phone]

## 2013-10-24 NOTE — Progress Notes (Signed)
Progress Note from the Palliative Medicine Team at [facility]  Subjective:  -patient is sitting OOB in chair, with weakness and fatigue  -discussed with Kaitlin [name] that hospice was unable to offer services at this time, she continues to verbalize her desire to attempt to go home  And would be ok with advanced home care managing her IV medications if that is the plan of care  -discussed the risks/benefits of being at home alone most of the time.   Will continue to try to work out logistics with family      Objective: Allergies  Allergen Reactions  . Xopenex [Levalbuterol Hcl]     Rash in mouth   Scheduled Meds: . amiodarone  200 mg Oral BID  . amLODipine  10 mg Oral Daily  . antiseptic oral rinse  15 mL Mouth Rinse BID  . citalopram  20 mg Oral Daily  . docusate sodium  100 mg Oral Daily  . ferrous sulfate  325 mg Oral Q breakfast  . hydrALAZINE  25 mg Oral TID  . magnesium oxide  200 mg Oral Daily  . metolazone  2.5 mg Oral Once  . mexiletine  150 mg Oral 3 times per day  . multivitamins with iron  1 tablet Oral Daily  . ondansetron  4 mg Oral Q12H  . pantoprazole  40 mg Oral Daily  . potassium chloride  20 mEq Oral Daily  . sildenafil  20 mg Oral TID  . simvastatin  10 mg Oral q1800  . spironolactone  25 mg Oral BID  . torsemide  20 mg Oral BID  . warfarin  5 mg Oral ONCE-1800  . Warfarin - Pharmacist Dosing Inpatient   Does not apply q1800   Continuous Infusions: . sodium chloride 5 mL/hr at 10/23/13 1001  . DOBUTamine 5 mcg/kg/min (10/24/13 0600)   PRN Meds:.acetaminophen, bisacodyl, hydrALAZINE, ondansetron (ZOFRAN) IV, traMADol, zolpidem  BP 154/61  Pulse 69  Temp(Src) 98.4 F (36.9 C) (Oral)  Resp 23  Ht 5\' 3"  (1.6 m)  Wt 60.2 kg (132 lb 11.5 oz)  BMI 23.52 kg/m2  SpO2 100%   PPS: 30 %   Pain Score:denies   Intake/Output Summary (Last 24 hours) at 10/24/13 1108 Last data filed at 10/24/13 0800  Gross per 24 hour  Intake   1010 ml  Output   1000 ml   Net     10 ml       Physical Exam:  General: chronically ill appearing, fatigued,   HEENT: Moist buccal membranes, no exudate  Chest: CTA  CVS: noted LVAD hum  Abdomen: minimal distention, NT, +BS noted drive line CDI  Ext: without edema  Neuro:alert and oriented X3  Labs: CBC    Component Value Date/Time   WBC 11.6* 10/23/2013 0300   RBC 3.30* 10/23/2013 0300   HGB 9.5* 10/23/2013 0300   HCT 29.0* 10/23/2013 0300   PLT 325 10/23/2013 0300   MCV 87.9 10/23/2013 0300   MCH 28.8 10/23/2013 0300   MCHC 32.8 10/23/2013 0300   RDW 16.8* 10/23/2013 0300   LYMPHSABS 0.8 09/15/2013 2204   MONOABS 0.4 09/15/2013 2204   EOSABS 0.1 09/15/2013 2204   BASOSABS 0.0 09/15/2013 2204    BMET    Component Value Date/Time   NA 132* 10/24/2013 0300   K 4.1 10/24/2013 0300   CL 92* 10/24/2013 0300   CO2 23 10/24/2013 0300   GLUCOSE 113* 10/24/2013 0300   BUN 37* 10/24/2013 0300   CREATININE  2.48* 10/24/2013 0300   CALCIUM 10.0 10/24/2013 0300   GFRNONAA 19* 10/24/2013 0300   GFRAA 22* 10/24/2013 0300    CMP     Component Value Date/Time   NA 132* 10/24/2013 0300   K 4.1 10/24/2013 0300   CL 92* 10/24/2013 0300   CO2 23 10/24/2013 0300   GLUCOSE 113* 10/24/2013 0300   BUN 37* 10/24/2013 0300   CREATININE 2.48* 10/24/2013 0300   CALCIUM 10.0 10/24/2013 0300   PROT 7.4 10/07/2013 1329   ALBUMIN 4.2 10/06/2013 1329   AST 32 10/05/2013 1329   ALT 29 10/06/2013 1329   ALKPHOS 178* 10/07/2013 1329   BILITOT 2.6* 10/26/2013 1329   GFRNONAA 19* 10/24/2013 0300   GFRAA 22* 10/24/2013 0300    Assessment and Plan: 1. Code Status:     -discussed with patient the concept of intubation, "I don not want that it has already been decided"  2. Symptom Control:     -Weakness-medical management of chronic disease   3. Psycho/Social:  Emotional support offered  4. Disposition: Dependant on outcomes    Time In Time Out Total Time Spent with Patient Total Overall Time  1445 1520 35 min  35 min    Greater than 50%   of this time was spent counseling and coordinating care related to the above assessment and plan.  Lorinda Creed NP  Palliative Medicine Team Team Phone # [phone] Pager [phone]  Left message with  Ulla Potash NP and discussed with Hessie Diener RN LVAD Coordinator  1

## 2013-10-24 NOTE — Procedures (Signed)
Successful RUE DL POWER PICC TIP SVC/RA NO COMP STABLE READY FOR USE  

## 2013-10-24 NOTE — Progress Notes (Signed)
ANTICOAGULATION CONSULT NOTE - Follow Up Consult  Pharmacy Consult for Coumadin Indication: LVAD  Allergies  Allergen Reactions  . Xopenex [Levalbuterol Hcl]     Rash in mouth    Patient Measurements: Wt = 132lb Ht = 160cm  Vital Signs: Temp: 98.4 F (36.9 C) (06/24 0800) Temp src: Oral (06/24 0800) Pulse Rate: 69 (06/24 0800)  Labs:  Recent Labs  10/22/13 0235 10/23/13 0300 10/24/13 0300  HGB 9.4* 9.5*  --   HCT 28.8* 29.0*  --   PLT 314 325  --   LABPROT 23.9* 22.2* 26.4*  INR 2.22* 2.02* 2.53*  CREATININE 2.08* 2.04* 2.48*    Estimated Creatinine Clearance: 18.7 ml/min (by C-G formula based on Cr of 2.48).  Assessment: Kaitlin [name] continues on coumadin for LVAD (12/2012). Coumadin doses held 6/19 and 6/20 due to elevated INR and then resumed 6/21. INR is therapeutic today. Changing amiodarone to PO today 200mg  bid which is less than her usual home dose of 400mg  bid.  Home dose 2.5mg  daily except 5mg  MWF  Goal of Therapy:  INR 2-3 Monitor platelets by anticoagulation protocol: Yes   Plan:  1) Coumadin 5mg  x 1 2) INR in AM  Fredrik Rigger 10/24/2013,9:00 AM

## 2013-10-24 NOTE — Progress Notes (Signed)
VAD coordinator paged to room to accompany patient for PICC line placement. Prior to leaving, [employer] rep re-programmed ICD to VVI 75. Confirmed therapies  VT1 171 bpm, VT2 222 bpm, and VF 240 bpm.  Pt tolerated right arm dual PICC placement without difficulty except c/o nausea. Accompanied pt to room, nurse will give prn Zofran. Placed patient on power module, confirmed back up controller at bedside.

## 2013-10-25 ENCOUNTER — Encounter: Payer: Self-pay | Admitting: Internal Medicine

## 2013-10-25 DIAGNOSIS — R570 Cardiogenic shock: Secondary | ICD-10-CM

## 2013-10-25 LAB — BASIC METABOLIC PANEL
BUN: 40 mg/dL — ABNORMAL HIGH (ref 6–23)
CALCIUM: 10 mg/dL (ref 8.4–10.5)
CO2: 25 mEq/L (ref 19–32)
Chloride: 90 mEq/L — ABNORMAL LOW (ref 96–112)
Creatinine, Ser: 2.61 mg/dL — ABNORMAL HIGH (ref 0.50–1.10)
GFR calc non Af Amer: 18 mL/min — ABNORMAL LOW (ref >90)
GFR, EST AFRICAN AMERICAN: 21 mL/min — AB (ref >90)
GLUCOSE: 130 mg/dL — AB (ref 70–99)
Potassium: 4.2 mEq/L (ref 3.7–5.3)
SODIUM: 130 meq/L — AB (ref 137–147)

## 2013-10-25 LAB — CARBOXYHEMOGLOBIN
Carboxyhemoglobin: 0.9 % (ref 0.5–1.5)
Methemoglobin: 1.1 % (ref 0.0–1.5)
O2 SAT: 42.5 %
Total hemoglobin: 11.1 g/dL — ABNORMAL LOW (ref 12.0–16.0)

## 2013-10-25 LAB — PROTIME-INR
INR: 3.95 — AB (ref 0.00–1.49)
Prothrombin Time: 38.6 seconds — ABNORMAL HIGH (ref 11.6–15.2)

## 2013-10-25 LAB — LACTATE DEHYDROGENASE: LDH: 230 U/L (ref 94–250)

## 2013-10-25 MED ORDER — SORBITOL 70 % SOLN
30.0000 mL | Freq: Once | Status: AC
  Administered 2013-10-26: 30 mL via ORAL
  Filled 2013-10-25: qty 30

## 2013-10-25 MED ORDER — SIMETHICONE 40 MG/0.6ML PO SUSP
40.0000 mg | Freq: Four times a day (QID) | ORAL | Status: DC | PRN
  Filled 2013-10-25: qty 0.6

## 2013-10-25 NOTE — Progress Notes (Signed)
ANTICOAGULATION CONSULT NOTE - Follow Up Consult  Pharmacy Consult for Coumadin Indication: LVAD  Allergies  Allergen Reactions  . Xopenex [Levalbuterol Hcl]     Rash in mouth    Patient Measurements: Wt = 135lb Ht = 160cm  Vital Signs: Temp: 98.5 F (36.9 C) (06/25 0356) Temp src: Oral (06/25 0356) BP: 90/0 mmHg (06/25 0400) Pulse Rate: 77 (06/25 0400)  Labs:  Recent Labs  10/23/13 0300 10/24/13 0300 10/25/13 0400  HGB 9.5*  --   --   HCT 29.0*  --   --   PLT 325  --   --   LABPROT 22.2* 26.4* 38.6*  INR 2.02* 2.53* 3.95*  CREATININE 2.04* 2.48* 2.61*    Estimated Creatinine Clearance: 17.8 ml/min (by C-G formula based on Cr of 2.61).  Assessment: 67yof continues on coumadin for LVAD (12/2012). Coumadin doses held 6/19 and 6/20 due to elevated INR and then resumed 6/21. INR has jumped and is above goal today. Amiodarone changed to PO 200mg  bid yesterday which is less than her usual home dose of 400mg  bid.  Home dose 2.5mg  daily except 5mg  MWF  Goal of Therapy:  INR 2-3 Monitor platelets by anticoagulation protocol: Yes   Plan:  1) No coumadin tonight 2) INR in AM  Fredrik Rigger 10/25/2013,7:14 AM

## 2013-10-25 NOTE — Progress Notes (Signed)
Offered sorbitol early but claimed to have it later today.

## 2013-10-25 NOTE — Progress Notes (Signed)
CARDIAC REHAB PHASE I   PRE:  Rate/Rhythm: 75 pacing  BP:  Supine: 104 map  Sitting:   Standing:    SaO2: 91% 3L  MODE:  Ambulation: 140 ft Sat three times to rest  POST:  Rate/Rhythm: 75 pacing  BP:  Supine:   Sitting: 90 map  Standing:    SaO2: 91% 4L 1140-1220 Pt sleepy but willing to walk. Walked 140 ft on 4L using rollator and asst x 2. Weak and had to sit three times on rollator to rest. First sit down pt unsteady and had difficulty turning to sit, needed steadying, but other two rest times pt able to sit more safely. To recliner after walk. Call bell in reach. Back up bag taken on walk. Generalized weakness but pt happy to walk.  Luetta Nutting, RN BSN  10/25/2013 12:16 PM

## 2013-10-25 NOTE — Progress Notes (Signed)
Ambulated along the hallway by cardiac rehab. with 2 person assist tolerated well. ( see Cardiac  Rehab notes).

## 2013-10-25 NOTE — Progress Notes (Addendum)
Patient ID: Kaitlin [name], female   DOB: [birth year], 68 y.o.   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 She is being admitted from [facility] on 6/17 for recurrent RHF.   8/18 Underwent HM II LVAD implant (under destination criteria) along with TV repair (tricuspid annuloplasty). Post-op course c/b RHF requiring take back to the OR for bleeding and temporay RVAD support. Subsequently started on dobutamine for RV support.   Admitted in early June for recurrent VT in setting of hypokalemia which was complicated by  recurrent RHF. Dobutamine increased to 4 mcg/kg/min. RAMP ECHO performed and speed turned down to 9200 to hopefully allow RV to keep up.   Admitted from HF clinic with fatigue and dyspnea on minimal exertion. Dobutamine was increased to 5 mcg and diuretics held due to worsening renal functions. On admit VAD speed turned down to 9000 due RHF.   6/21, patient went into VT with rate in the 150s and was sedated with Versed 3 mg IV and Fentanyl 100 mcg IV and DCCV at 150 J with resumption of paced rhythm.  Patient went into VT at 150 bpm again 6/22 and DCCV at 150 J.   Yesterday went for PICC placement in IR. Gave 80 mg IV lasix with 2.5 mg metolazone with minimal UOP. Weight up 3 lbs and Cr trending up. Co-ox 43%. Hospice declined patient with home dobutamine. PT and OT recommend home with 24 hrs supervision. +Fatgiue and SOB.   CO-OX 44%> 37>53%  >28%>42%>39%>43% Creatinine 2.1> 1.7>1.5  >1.8 >2.0 >2.0>2.48>2.6 INR 2.53>3.95   LVAD interrogation reveals:  Speed: 9200  Flow: 3.8 Power: 4.7 PI: 5.8 Alarms: none  Events: ~15-20 events a day  Objective:   Weight Range:  Vital Signs:   Temp:  [97.9 F (36.6 C)-98.5 F (36.9 C)] 98.5 F (36.9 C) (06/25 0356) Pulse Rate:  [30-111] 77  (06/25 0400) Resp:  [16-32] 32 (06/25 0400) BP: (80-90)/(0) 90/0 mmHg (06/25 0400) SpO2:  [86 %-100 %] 98 % (06/25 0400) Weight:  [135 lb 6.4 oz (61.417 kg)] 135 lb 6.4 oz (61.417 kg) (06/25 0356) Last BM Date: 10/21/13  Weight change: Filed Weights   10/23/13 0405 10/24/13 0300 10/25/13 0356  Weight: 132 lb 11.5 oz (60.2 kg) 132 lb 11.5 oz (60.2 kg) 135 lb 6.4 oz (61.417 kg)    Intake/Output:   Intake/Output Summary (Last 24 hours) at 10/25/13 0714 Last data filed at 10/25/13 0600  Gross per 24 hour  Intake    474 ml  Output   1350 ml  Net   -876 ml     Physical Exam: General: No resp difficulty, lying in bed, fatigued HEENT: normal,  Neck: supple. JVP ear with prominent CV waves . Carotids 2+ bilat; no bruits. No lymphadenopathy or thryomegaly appreciated.  Cor: Mechanical heart sounds with LVAD hum present.  Lungs: clear  Abdomen: nontender, ++++distended. No hepatosplenomegaly. No bruits or masses. Good bowel sounds.  Driveline: C/D/I; securement device intact and driveline incorporated  Extremities: no cyanosis, clubbing, rash, edema,  Neuro: alert & orientedx3, cranial nerves grossly intact. moves all 4 extremities w/o difficulty. Affect pleasant   Telemetry: V-paced 75s  Labs: Basic Metabolic Panel:  Recent Labs Lab 10/21/13 0411 10/21/13 0830 10/22/13 0235 10/23/13 0300 10/24/13 0300 10/25/13 0400  NA  133*  --  130* 133* 132* 130*  K 4.1  --  4.0 3.8 4.1 4.2  CL 93*  --  91* 92* 92* 90*  CO2 22  --  $R'20 23 23 25  'fR$ GLUCOSE 118*  --  106* 107* 113* 130*  BUN 40*  --  39* 36* 37* 40*  CREATININE 2.03*  --  2.08* 2.04* 2.48* 2.61*  CALCIUM 10.3  --  10.0 10.0 10.0 10.0  MG  --  2.2  --   --   --   --     Liver Function Tests: No results found for this basename: AST, ALT, ALKPHOS, BILITOT, PROT, ALBUMIN,  in the last 168 hours No results found for this basename: LIPASE, AMYLASE,  in the last 168 hours No results found for this basename: AMMONIA,  in the  last 168 hours  CBC:  Recent Labs Lab 10/22/13 0235 10/23/13 0300  WBC 12.1* 11.6*  HGB 9.4* 9.5*  HCT 28.8* 29.0*  MCV 87.5 87.9  PLT 314 325    Cardiac Enzymes: No results found for this basename: CKTOTAL, CKMB, CKMBINDEX, TROPONINI,  in the last 168 hours  BNP: BNP (last 3 results)  Recent Labs  10/03/13 1306 10/10/13 2001 10/11/2013 1329  PROBNP 1462.0* 1759.0* 3826.0*     Other results:    Imaging: Ir Fluoro Guide Cv Line Right  10/24/2013   CLINICAL DATA:  Chronic congestive heart failure, access for IV therapy  EXAM: POWER PICC LINE PLACEMENT WITH ULTRASOUND AND FLUOROSCOPIC GUIDANCE  FLUOROSCOPY TIME:  24 seconds  PROCEDURE: The patient was advised of the possible risks andcomplications and agreed to undergo the procedure. The patient was then brought to the angiographic suite for the procedure.  The right arm was prepped with chlorhexidine, drapedin the usual sterile fashion using maximum barrier technique (cap and mask, sterile gown, sterile gloves, large sterile sheet, hand hygiene and cutaneous antisepsis) and infiltrated locally with 1% Lidocaine.  Ultrasound demonstrated patency of the right basilic vein, and this was documented with an image. Under real-time ultrasound guidance, this vein was accessed with a 21 gauge micropuncture needle and image documentation was performed. A 0.018 wire was introduced in to the vein. Over this, a 5 [place] double lumen power PICC was advanced to the lower SVC/right atrial junction. Fluoroscopy during the procedure and fluoro spot radiograph confirms appropriate catheter position. The catheter was flushed and covered with asterile dressing.  Catheter length: 41 cm  Complications: No immediate  IMPRESSION: Successful right arm power PICC line placement with ultrasound and fluoroscopic guidance. The catheter is ready for use.   Electronically Signed   By: Daryll Brod M.D.   On: 10/24/2013 10:25   Ir US Guide Vasc Access  Right  10/24/2013   CLINICAL DATA:  Chronic congestive heart failure, access for IV therapy  EXAM: POWER PICC LINE PLACEMENT WITH ULTRASOUND AND FLUOROSCOPIC GUIDANCE  FLUOROSCOPY TIME:  24 seconds  PROCEDURE: The patient was advised of the possible risks andcomplications and agreed to undergo the procedure. The patient was then brought to the angiographic suite for the procedure.  The right arm was prepped with chlorhexidine, drapedin the usual sterile fashion using maximum barrier technique (cap and mask, sterile gown, sterile gloves, large sterile sheet, hand hygiene and cutaneous antisepsis) and infiltrated locally with 1% Lidocaine.  Ultrasound demonstrated patency of the right basilic vein, and this was documented with an image. Under real-time ultrasound guidance, this vein was accessed with a 21 gauge micropuncture needle  and image documentation was performed. A 0.018 wire was introduced in to the vein. Over this, a 5 [place] double lumen power PICC was advanced to the lower SVC/right atrial junction. Fluoroscopy during the procedure and fluoro spot radiograph confirms appropriate catheter position. The catheter was flushed and covered with asterile dressing.  Catheter length: 41 cm  Complications: No immediate  IMPRESSION: Successful right arm power PICC line placement with ultrasound and fluoroscopic guidance. The catheter is ready for use.   Electronically Signed   By: Daryll Brod M.D.   On: 10/24/2013 10:25     Medications:     Scheduled Medications: . amiodarone  200 mg Oral BID  . amLODipine  10 mg Oral Daily  . antiseptic oral rinse  15 mL Mouth Rinse BID  . citalopram  20 mg Oral Daily  . docusate sodium  100 mg Oral Daily  . ferrous sulfate  325 mg Oral Q breakfast  . hydrALAZINE  25 mg Oral TID  . magnesium oxide  200 mg Oral Daily  . mexiletine  150 mg Oral 3 times per day  . multivitamins with iron  1 tablet Oral Daily  . ondansetron  4 mg Oral Q12H  . pantoprazole  40 mg  Oral Daily  . potassium chloride  20 mEq Oral Daily  . sildenafil  20 mg Oral TID  . simvastatin  10 mg Oral q1800  . spironolactone  25 mg Oral BID  . torsemide  20 mg Oral BID  . Warfarin - Pharmacist Dosing Inpatient   Does not apply q1800    Infusions: . sodium chloride 5 mL/hr at 10/23/13 1001  . DOBUTamine 5 mcg/kg/min (10/24/13 0600)    PRN Medications: acetaminophen, bisacodyl, hydrALAZINE, ondansetron (ZOFRAN) IV, traMADol, zolpidem   Assessment:   1. Right heart failure  2. Cardiogenic shock 3. Chronic Systolic HF s/p HMII VAD placement  4. Acute on chronic renal failure, stage III-IV  5. Restrictive lung disease  6. H/o VT: Most recently VT am 6/22   Plan/Discussion:    She continues to struggle with profound RHF. Co-ox has remained low and patient has had 2 episodes of VT. She is now on Amiodarone and mexiletine. PICC replaced yesterday and co-ox remains low on dobutamine 5 mcg/kg/min.  She remains volume overloaded and is having minimal UOP with IV lasix and metolazone. Cr trending up and having periods of nausea likely related to low output. Stop Kaitlin [name].  CVP 22. Will continue to try and diurese but unfortunately I believe we approaching the end. Pacemaker increased to V pacing at 75 with no improvements. She has met with Palliative Care and they will follow up with her today. Hospice has declined to take patient home with dobutamine and she is too sick to leave the hospital alone. She does not have 24 hour support. Will need to talk with patient and family later today about prognosis.  MAPs more controlled 80-100s will continue hydralazine 25 mg TID. She will not be on Imdur with Revatio. Her PI events have improved with better BP control.    Patient has only a VF zone (220) on her ICD.  No ATP as has accelerated VT in the past.   INR trending up, pharmacy dosing.   Abdomen distended will give sorbitol and mylicon.   I reviewed the LVAD parameters from today,  and compared the results to the patient's prior recorded data. No programming changes were made. The LVAD is functioning within specified parameters. The patient performs  LVAD self-test daily. LVAD interrogation was negative for any significant power changes, alarms or PI events/speed drops. LVAD equipment check completed and is in good working order. Back-up equipment present. LVAD education done on emergency procedures and precautions and reviewed exit site care.  Rande Brunt, NP-C 7:14 AM 10/25/2013  VAD Team --- VAD ISSUES ONLY---  Pager [phone] (7am - 7am)   Advanced Heart Failure Team  Pager [phone] (M-F; 7a - 4p)  Please contact [facility] for night-coverage after hours (4p -7a ) and weekends on [email or web address]  Patient seen and examined with Kaitlin Bame, NP. We discussed all aspects of the encounter. I agree with the assessment and plan as stated above.   She has worsening RHF/cardiogenic shock despite inotrope support and now has progressive renal failure with attempts at diuresis despite CVP ~25. She has not responded to increasing her paced rate or adjusting VAD speed. Could add second inotrope but this would be temporary support without a good long-term option. Would hold diuretics today. She cannot go home in this condition and has been refused from hospice due to dobutamine. End-of-life discussions are ongoing. We will observe for another 24-48 hours and if continues to deteriorate may need morphine for comfort care. Will deactivate all tachy therapies on ICD while in house.  Daniel Bensimhon,MD 10:15 AM

## 2013-10-26 LAB — BASIC METABOLIC PANEL
BUN: 49 mg/dL — ABNORMAL HIGH (ref 6–23)
CALCIUM: 10.2 mg/dL (ref 8.4–10.5)
CO2: 24 mEq/L (ref 19–32)
Chloride: 87 mEq/L — ABNORMAL LOW (ref 96–112)
Creatinine, Ser: 2.64 mg/dL — ABNORMAL HIGH (ref 0.50–1.10)
GFR, EST AFRICAN AMERICAN: 21 mL/min — AB (ref >90)
GFR, EST NON AFRICAN AMERICAN: 18 mL/min — AB (ref >90)
GLUCOSE: 121 mg/dL — AB (ref 70–99)
POTASSIUM: 4.5 meq/L (ref 3.7–5.3)
SODIUM: 128 meq/L — AB (ref 137–147)

## 2013-10-26 LAB — PROTIME-INR
INR: 4.97 — AB (ref 0.00–1.49)
Prothrombin Time: 46.2 seconds — ABNORMAL HIGH (ref 11.6–15.2)

## 2013-10-26 LAB — CARBOXYHEMOGLOBIN
Carboxyhemoglobin: 1.4 % (ref 0.5–1.5)
Methemoglobin: 1.1 % (ref 0.0–1.5)
O2 SAT: 54.8 %
TOTAL HEMOGLOBIN: 7.4 g/dL — AB (ref 12.0–16.0)

## 2013-10-26 MED ORDER — TORSEMIDE 20 MG PO TABS
40.0000 mg | ORAL_TABLET | Freq: Once | ORAL | Status: AC
  Administered 2013-10-26: 40 mg via ORAL
  Filled 2013-10-26: qty 2

## 2013-10-26 NOTE — Progress Notes (Signed)
Pt worked with PT 1 hr ago. Declines walking. Feels groggy from muscle relaxer today. Will f/u tomorrow per pt request. Ethelda Chick CES, ACSM 1:36 PM 10/26/2013

## 2013-10-26 NOTE — Progress Notes (Signed)
ANTICOAGULATION CONSULT NOTE - Follow Up Consult  Pharmacy Consult for Coumadin Indication: LVAD  Allergies  Allergen Reactions  . Xopenex [Levalbuterol Hcl]     Rash in mouth    Patient Measurements: Wt = 138lb Ht = 160cm  Vital Signs: Temp: 98.1 F (36.7 C) (06/26 0400) Temp src: Oral (06/26 0400) Pulse Rate: 73 (06/26 0700)  Labs:  Recent Labs  10/24/13 0300 10/25/13 0400 10/26/13 0400  LABPROT 26.4* 38.6* 46.2*  INR 2.53* 3.95* 4.97*  CREATININE 2.48* 2.61* 2.64*    Estimated Creatinine Clearance: 17.6 ml/min (by C-G formula based on Cr of 2.64).  Assessment: 67yof continues on coumadin for LVAD (12/2012). INR has increased even further despite holding coumadin last night. Amiodarone changed to PO 200mg  bid 6/24 which is less than her usual home dose of 400mg  bid.  Home dose 2.5mg  daily except 5mg  MWF  Goal of Therapy:  INR 2-3 Monitor platelets by anticoagulation protocol: Yes   Plan:  1) No coumadin tonight 2) INR in AM  Fredrik Rigger 10/26/2013,7:59 AM

## 2013-10-26 NOTE — Progress Notes (Addendum)
Patient ID: Kaitlin [name], female   DOB: December 02, 1945, 68 y.o.   MRN: [id number] Advanced Heart Failure Rounding Note   Subjective:    Kaitlin [name] is a 68 y/o woman with h/o severe biventricular HF due to NICM s/p HM II LVAD implant (12/2012), VT s/ Biotronik ICD, CKD (Baseline Cr 1.5-1.6) and RHF. She is being admitted from HF Clinic on 6/17 for recurrent RHF.   8/18 Underwent HM II LVAD implant (under destination criteria) along with TV repair (tricuspid annuloplasty). Post-op course c/b RHF requiring take back to the OR for bleeding and temporay RVAD support. Subsequently started on dobutamine for RV support.   Admitted in early June for recurrent VT in setting of hypokalemia which was complicated by  recurrent RHF. Dobutamine increased to 4 mcg/kg/min. RAMP ECHO performed and speed turned down to 9200 to hopefully allow RV to keep up.   Admitted from HF clinic with fatigue and dyspnea on minimal exertion. Dobutamine was increased to 5 mcg and diuretics held due to worsening renal functions. On admit VAD speed turned down to 9000 due RHF.   6/21, patient went into VT with rate in the 150s and was sedated with Versed 3 mg IV and Fentanyl 100 mcg IV and DCCV at 150 J with resumption of paced rhythm.  Patient went into VT at 150 bpm again 6/22 and DCCV at 150 J.   PICC replaced in IR. Receiving IV lasix and metolazone with minimal UOP. Co-ox slightly improved but Cr trending up. +fatgiue, SOB and abdominal distention. Ambulated 2 person assist and with rollator walked 180 ft yesterday. Tachy therapies deactivated on ICD.    CO-OX 44%> 37>53%  >28%>42%>39%>43% Creatinine 2.1> 1.7>1.5  >1.8 >2.0 >2.0>2.48>2.6 INR 2.53>3.95> 4.97   LVAD interrogation reveals:  Speed: 9200  Flow: 3.8 Power: 5.0 PI: 6.5 Alarms: none  Events: ~ 5-8 events a day  Objective:   Weight Range:  Vital Signs:   Temp:  [97.5 F (36.4 C)-98.2 F (36.8 C)] 98.1 F (36.7 C) (06/26 0400) Pulse Rate:  [72-75]  73 (06/26 0700) Resp:  [18-29] 20 (06/26 0700) SpO2:  [90 %-97 %] 93 % (06/26 0700) Weight:  [138 lb 7.2 oz (62.8 kg)] 138 lb 7.2 oz (62.8 kg) (06/26 0549) Last BM Date: 10/24/13  Weight change: Filed Weights   10/24/13 0300 10/25/13 0356 10/26/13 0549  Weight: 132 lb 11.5 oz (60.2 kg) 135 lb 6.4 oz (61.417 kg) 138 lb 7.2 oz (62.8 kg)    Intake/Output:   Intake/Output Summary (Last 24 hours) at 10/26/13 0800 Last data filed at 10/26/13 0700  Gross per 24 hour  Intake  645.4 ml  Output    600 ml  Net   45.4 ml    CVP 25 Physical Exam: General: No resp difficulty, lying in bed, fatigued HEENT: normal,  Neck: supple. JVP ear with prominent CV waves . Carotids 2+ bilat; no bruits. No lymphadenopathy or thryomegaly appreciated.  Cor: Mechanical heart sounds with LVAD hum present.  Lungs: clear  Abdomen: nontender, ++++distended. No hepatosplenomegaly. No bruits or masses. Good bowel sounds.  Driveline: C/D/I; securement device intact and driveline incorporated  Extremities: no cyanosis, clubbing, rash, edema,  Neuro: alert & orientedx3, cranial nerves grossly intact. moves all 4 extremities w/o difficulty. Affect pleasant   Telemetry: V-paced 75s  Labs: Basic Metabolic Panel:  Recent Labs Lab 10/21/13 0830 10/22/13 0235 10/23/13 0300 10/24/13 0300 10/25/13 0400 10/26/13 0400  NA  --  130* 133* 132* 130* 128*  K  --  4.0 3.8 4.1 4.2 4.5  CL  --  91* 92* 92* 90* 87*  CO2  --  20 23 23 25 24   GLUCOSE  --  106* 107* 113* 130* 121*  BUN  --  39* 36* 37* 40* 49*  CREATININE  --  2.08* 2.04* 2.48* 2.61* 2.64*  CALCIUM  --  10.0 10.0 10.0 10.0 10.2  MG 2.2  --   --   --   --   --     Liver Function Tests: No results found for this basename: AST, ALT, ALKPHOS, BILITOT, PROT, ALBUMIN,  in the last 168 hours No results found for this basename: LIPASE, AMYLASE,  in the last 168 hours No results found for this basename: AMMONIA,  in the last 168 hours  CBC:  Recent  Labs Lab 10/22/13 0235 10/23/13 0300  WBC 12.1* 11.6*  HGB 9.4* 9.5*  HCT 28.8* 29.0*  MCV 87.5 87.9  PLT 314 325    Cardiac Enzymes: No results found for this basename: CKTOTAL, CKMB, CKMBINDEX, TROPONINI,  in the last 168 hours  BNP: BNP (last 3 results)  Recent Labs  10/03/13 1306 10/10/13 2001 10/21/2013 1329  PROBNP 1462.0* 1759.0* 3826.0*     Other results:    Imaging: Ir Fluoro Guide Cv Line Right  10/24/2013   CLINICAL DATA:  Chronic congestive heart failure, access for IV therapy  EXAM: POWER PICC LINE PLACEMENT WITH ULTRASOUND AND FLUOROSCOPIC GUIDANCE  FLUOROSCOPY TIME:  24 seconds  PROCEDURE: The patient was advised of the possible risks andcomplications and agreed to undergo the procedure. The patient was then brought to the angiographic suite for the procedure.  The right arm was prepped with chlorhexidine, drapedin the usual sterile fashion using maximum barrier technique (cap and mask, sterile gown, sterile gloves, large sterile sheet, hand hygiene and cutaneous antisepsis) and infiltrated locally with 1% Lidocaine.  Ultrasound demonstrated patency of the right basilic vein, and this was documented with an image. Under real-time ultrasound guidance, this vein was accessed with a 21 gauge micropuncture needle and image documentation was performed. A 0.018 wire was introduced in to the vein. Over this, a 5 [place] double lumen power PICC was advanced to the lower SVC/right atrial junction. Fluoroscopy during the procedure and fluoro spot radiograph confirms appropriate catheter position. The catheter was flushed and covered with asterile dressing.  Catheter length: 41 cm  Complications: No immediate  IMPRESSION: Successful right arm power PICC line placement with ultrasound and fluoroscopic guidance. The catheter is ready for use.   Electronically Signed   By: Ruel Favors M.D.   On: 10/24/2013 10:25   Ir US Guide Vasc Access Right  10/24/2013   CLINICAL DATA:  Chronic  congestive heart failure, access for IV therapy  EXAM: POWER PICC LINE PLACEMENT WITH ULTRASOUND AND FLUOROSCOPIC GUIDANCE  FLUOROSCOPY TIME:  24 seconds  PROCEDURE: The patient was advised of the possible risks andcomplications and agreed to undergo the procedure. The patient was then brought to the angiographic suite for the procedure.  The right arm was prepped with chlorhexidine, drapedin the usual sterile fashion using maximum barrier technique (cap and mask, sterile gown, sterile gloves, large sterile sheet, hand hygiene and cutaneous antisepsis) and infiltrated locally with 1% Lidocaine.  Ultrasound demonstrated patency of the right basilic vein, and this was documented with an image. Under real-time ultrasound guidance, this vein was accessed with a 21 gauge micropuncture needle and image documentation was performed. A 0.018 wire was introduced in to the  vein. Over this, a 5 [place] double lumen power PICC was advanced to the lower SVC/right atrial junction. Fluoroscopy during the procedure and fluoro spot radiograph confirms appropriate catheter position. The catheter was flushed and covered with asterile dressing.  Catheter length: 41 cm  Complications: No immediate  IMPRESSION: Successful right arm power PICC line placement with ultrasound and fluoroscopic guidance. The catheter is ready for use.   Electronically Signed   By: Ruel Favors M.D.   On: 10/24/2013 10:25     Medications:     Scheduled Medications: . amiodarone  200 mg Oral BID  . amLODipine  10 mg Oral Daily  . antiseptic oral rinse  15 mL Mouth Rinse BID  . citalopram  20 mg Oral Daily  . docusate sodium  100 mg Oral Daily  . ferrous sulfate  325 mg Oral Q breakfast  . hydrALAZINE  25 mg Oral TID  . magnesium oxide  200 mg Oral Daily  . mexiletine  150 mg Oral 3 times per day  . multivitamins with iron  1 tablet Oral Daily  . ondansetron  4 mg Oral Q12H  . pantoprazole  40 mg Oral Daily  . potassium chloride  20 mEq Oral  Daily  . sildenafil  20 mg Oral TID  . simvastatin  10 mg Oral q1800  . sorbitol  30 mL Oral Once  . Warfarin - Pharmacist Dosing Inpatient   Does not apply q1800    Infusions: . sodium chloride 5 mL/hr at 10/23/13 1001  . DOBUTamine 5 mcg/kg/min (10/26/13 0547)    PRN Medications: acetaminophen, bisacodyl, hydrALAZINE, ondansetron (ZOFRAN) IV, simethicone, traMADol, zolpidem   Assessment:   1. Acute on chronic systolic right heart failure  2. Cardiogenic shock - Co-ox 37.1% on dobutamine 3. Acute on chronic Systolic HF s/p HMII VAD placement  4. Acute on chronic renal failure, stage III-IV  5. Restrictive lung disease  6. H/o VT: Most recently VT am 6/22 7. Hyponatremia  Plan/Discussion:    She continues to struggle with profound RHF. She has had 2 episodes of VT and tachy therapies have now been turned off. She remains massively volume overloaded with a  CVP of 25. Will try to give 40 mg PO torsemide today to see if she has any response. Cr trending up.   Co-ox slightly improved on dobutamine 5 mcg/kg/min will continue. Hospice will not accept patient with dobutamine will continue to leave in house. Patient and family are moving more towards hospice/palliative care. She does not have 24 hour caregiver support and can't go home alone.   LVAD parameters within normal limits and she is having less PI events. MAPs better controlled 90-100s will continue hydralazine. ICD tachy therapies deactivated yesterday.   INR trending up, pharmacy dosing.   Abdomen distended will give sorbitol and mylicon today.   I reviewed the LVAD parameters from today, and compared the results to the patient's prior recorded data. No programming changes were made. The LVAD is functioning within specified parameters. The patient performs LVAD self-test daily. LVAD interrogation was negative for any significant power changes, alarms or PI events/speed drops. LVAD equipment check completed and is in good  working order. Back-up equipment present. LVAD education done on emergency procedures and precautions and reviewed exit site care.  Aundria Rud, NP-C 8:00 AM 10/26/2013  VAD Team --- VAD ISSUES ONLY---  Pager [phone] (7am - 7am)   Advanced Heart Failure Team  Pager [phone] (M-F; 7a - 4p)  Please contact  [facility] for night-coverage after hours (4p -7a ) and weekends on [email or web address]  Patient seen and examined with Ulla Potash, NP. We discussed all aspects of the encounter. I agree with the assessment and plan as stated above.   Feels better today but still very weak. Co-ox improved. CVP remains very high. Will try to resume diuretics today. VAD parameters stable. ICD therapies off. End-of-life discussions continue.   Daniel Bensimhon,MD 8:26 AM

## 2013-10-26 NOTE — Progress Notes (Signed)
Physical Therapy Treatment Patient Details Name: Kaitlin [name] MRN: [id number] DOB: June 07, 1945 Today's Date: 11/02/2013    History of Present Illness Pt is a 68 y/o female with h/o severe biventricular HF due to NICM s/p HM II LVAD implant (12/2012), VT s/ Biotronik ICD, CKD (Baseline Cr 1.5-1.6) and RHF. She is being admitted from HF Clinic on 6/17 for recurrent RHF.     PT Comments    Pt fatigues with minimal activity.  Follow Up Recommendations  No PT follow up;Supervision/Assistance - 24 hour     Equipment Recommendations  None recommended by PT    Recommendations for Other Services       Precautions / Restrictions Precautions Precautions: Fall Precaution Comments: LVAD Required Braces or Orthoses: Other Brace/Splint Other Brace/Splint: LVAD equipment    Mobility  Bed Mobility Overal bed mobility: Needs Assistance Bed Mobility: Supine to Sit     Supine to sit: Min assist     General bed mobility comments: Assist to bring trunk up.  Transfers Overall transfer level: Needs assistance Equipment used: 2 person hand held assist Transfers: Sit to/from Stand Sit to Stand: +2 safety/equipment;Min assist         General transfer comment: Assist for support and balance.  Ambulation/Gait Ambulation/Gait assistance: Min assist;+2 physical assistance Ambulation Distance (Feet): 5 Feet Assistive device: 1 person hand held assist Gait Pattern/deviations: Step-through pattern;Decreased step length - left;Decreased step length - right Gait velocity: Decreased Gait velocity interpretation: Below normal speed for age/gender General Gait Details: Pt fatigued and sleepy from meds and amb bed to chair.   Stairs            Wheelchair Mobility    Modified Rankin (Stroke Patients Only)       Balance Overall balance assessment: Needs assistance Sitting-balance support: No upper extremity supported;Feet supported Sitting balance-Leahy Scale: Fair      Standing balance support: Single extremity supported Standing balance-Leahy Scale: Poor                      Cognition Arousal/Alertness: Awake/alert Behavior During Therapy: WFL for tasks assessed/performed Overall Cognitive Status: Within Functional Limits for tasks assessed                      Exercises      General Comments        Pertinent Vitals/Pain VSS    Home Living                      Prior Function            PT Goals (current goals can now be found in the care plan section) Progress towards PT goals: Not progressing toward goals - comment (fatigue)    Frequency  Min 2X/week    PT Plan Current plan remains appropriate    Co-evaluation             End of Session Equipment Utilized During Treatment: Oxygen Activity Tolerance: Patient limited by fatigue Patient left: in chair;with call bell/phone within reach     Time: 1213-1236 PT Time Calculation (min): 23 min  Charges:  $Gait Training: 23-37 mins                    G Codes:      MAYCOCK,CARY Nov 02, 2013, 1:19 PM  [employer] PT [phone]

## 2013-10-26 NOTE — Progress Notes (Signed)
Nutrition Brief Note  Patient identified on the Malnutrition Screening Tool (MST) Report. Discussed nutrition plan of care with RN, Verlon Au. End-of-life discussions are continuing at this time. Per RN, no decisions have been made at this point. RN reports that current textures are appropriate for patient.  Wt Readings from Last 15 Encounters:  10/26/13 138 lb 7.2 oz (62.8 kg)  10/23/2013 140 lb 12.8 oz (63.866 kg)  10/14/13 133 lb 13.1 oz (60.7 kg)  10/09/13 132 lb (59.875 kg)  10/03/13 136 lb 9.6 oz (61.961 kg)  09/18/13 141 lb 6 oz (64.127 kg)  09/05/13 141 lb 12.8 oz (64.32 kg)  08/28/13 133 lb (60.328 kg)  08/15/13 140 lb 3.2 oz (63.594 kg)  08/02/13 140 lb (63.504 kg)  07/26/13 145 lb 8 oz (65.998 kg)  07/09/13 155 lb (70.308 kg)  06/27/13 147 lb 12.8 oz (67.042 kg)  06/13/13 150 lb 12 oz (68.38 kg)  05/16/13 148 lb (67.132 kg)    Body mass index is 24.53 kg/(m^2). Patient meets criteria for Normal Weight based on current BMI.   Current diet order is Heart Healthy, patient is consuming approximately 50% of meals at this time. Labs and medications reviewed.   No nutrition interventions warranted at this time. If nutrition issues arise, or aggressive nutrition therapy desired, please consult RD.   Jarold Motto MS, RD, LDN Inpatient Registered Dietitian Pager: [phone] After-hours pager: [phone]

## 2013-10-27 LAB — CBC
HEMATOCRIT: 29.9 % — AB (ref 36.0–46.0)
HEMOGLOBIN: 9.6 g/dL — AB (ref 12.0–15.0)
MCH: 28.4 pg (ref 26.0–34.0)
MCHC: 32.1 g/dL (ref 30.0–36.0)
MCV: 88.5 fL (ref 78.0–100.0)
Platelets: 375 10*3/uL (ref 150–400)
RBC: 3.38 MIL/uL — AB (ref 3.87–5.11)
RDW: 16.7 % — ABNORMAL HIGH (ref 11.5–15.5)
WBC: 14.4 10*3/uL — ABNORMAL HIGH (ref 4.0–10.5)

## 2013-10-27 LAB — BASIC METABOLIC PANEL
BUN: 55 mg/dL — ABNORMAL HIGH (ref 6–23)
CHLORIDE: 89 meq/L — AB (ref 96–112)
CO2: 23 meq/L (ref 19–32)
Calcium: 9.9 mg/dL (ref 8.4–10.5)
Creatinine, Ser: 3.38 mg/dL — ABNORMAL HIGH (ref 0.50–1.10)
GFR calc Af Amer: 15 mL/min — ABNORMAL LOW (ref >90)
GFR calc non Af Amer: 13 mL/min — ABNORMAL LOW (ref >90)
GLUCOSE: 132 mg/dL — AB (ref 70–99)
Potassium: 4.5 mEq/L (ref 3.7–5.3)
Sodium: 128 mEq/L — ABNORMAL LOW (ref 137–147)

## 2013-10-27 LAB — PROTIME-INR
INR: 5.37 — AB (ref 0.00–1.49)
Prothrombin Time: 49.1 seconds — ABNORMAL HIGH (ref 11.6–15.2)

## 2013-10-27 LAB — CARBOXYHEMOGLOBIN
Carboxyhemoglobin: 1.3 % (ref 0.5–1.5)
METHEMOGLOBIN: 0.7 % (ref 0.0–1.5)
O2 Saturation: 94.9 %
Total hemoglobin: 10 g/dL — ABNORMAL LOW (ref 12.0–16.0)

## 2013-10-27 MED ORDER — FENTANYL CITRATE 0.05 MG/ML IJ SOLN: 50.0000 ug | INTRAMUSCULAR | Status: DC | PRN

## 2013-10-27 MED ORDER — PROMETHAZINE HCL 25 MG/ML IJ SOLN
25.0000 mg | INTRAMUSCULAR | Status: DC | PRN
  Administered 2013-10-27: 25 mg via INTRAVENOUS
  Filled 2013-10-27: qty 1

## 2013-10-27 MED ORDER — FENTANYL CITRATE 0.05 MG/ML IJ SOLN
100.0000 ug/h | INTRAMUSCULAR | Status: DC
  Administered 2013-10-27: 100 ug/h via INTRAVENOUS
  Filled 2013-10-27: qty 50

## 2013-10-27 MED ORDER — MIDAZOLAM BOLUS VIA INFUSION
2.0000 mg | INTRAVENOUS | Status: DC | PRN
  Filled 2013-10-27: qty 2

## 2013-10-27 MED ORDER — SODIUM CHLORIDE 0.9 % IV SOLN
2.0000 mg/h | INTRAVENOUS | Status: DC
  Administered 2013-10-27: 2 mg/h via INTRAVENOUS
  Filled 2013-10-27: qty 10

## 2013-10-29 NOTE — Consult Note (Signed)
I have reviewed and discussed the care of this patient in detail with the nurse practitioner including pertinent patient records, physical exam findings and data. I agree with details of this encounter.  

## 2013-10-31 NOTE — Progress Notes (Signed)
Death pronounced at 1553 per Hessie Diener RN and Dr. Mallie Snooks. LVAD was discontinued at 1550. Emotional support given to family.

## 2013-10-31 NOTE — Progress Notes (Addendum)
240 cc of Fentanyl wasted from a 250 cc bag and 40 cc of Versed wasted from a 50 cc bag. Witnessed per two RNs. Clancy Gourd RN and Russ Halo RN

## 2013-10-31 NOTE — Progress Notes (Signed)
CRITICAL VALUE ALERT  Critical value received:  INR 5.37  Date of notification 10/18/2013  Time of notification:  0722  Critical value read back:yes  Nurse who received alert:  P. Mikle Bosworth RN  MD notified (1st page):   Dr. Gala Romney  Time of first page:0830  MD notified (2nd page):  Time of second page:  Responding MD:  Dr. Gala Romney ** Time MD responded: 0830

## 2013-10-31 NOTE — Progress Notes (Signed)
Emotional support given to pt and family, enc to notify RN with any needs.

## 2013-10-31 NOTE — Progress Notes (Signed)
ANTICOAGULATION CONSULT NOTE   Pharmacy Consult for Coumadin Indication: LVAD  Allergies  Allergen Reactions  . Xopenex [Levalbuterol Hcl]     Rash in mouth    Patient Measurements: Wt = 138lb Ht = 160cm  Vital Signs: Temp: 97.7 F (36.5 C) (06/27 0800) Temp src: Oral (06/27 0800) Pulse Rate: 75 (06/27 0700)  Labs:  Recent Labs  10/25/13 0400 10/26/13 0400 30-Oct-2013 0430  HGB  --   --  9.6*  HCT  --   --  29.9*  PLT  --   --  375  LABPROT 38.6* 46.2* 49.1*  INR 3.95* 4.97* 5.37*  CREATININE 2.61* 2.64* 3.38*    Estimated Creatinine Clearance: 13.7 ml/min (by C-G formula based on Cr of 3.38).  Assessment: 67yof continues on coumadin for LVAD (12/2012). INR has increased even further despite holding coumadin last night. Amiodarone changed to PO 200mg  bid 6/24 which is less than her usual home dose of 400mg  bid.  Home dose 2.5mg  daily except 5mg  MWF  Goal of Therapy:  INR 2-3 Monitor platelets by anticoagulation protocol: Yes   Plan:  1) Continue to hold warfarin  2) INR in AM  Sheppard Coil PharmD., BCPS Clinical Pharmacist Pager [phone] 10-30-2013 9:09 AM

## 2013-10-31 NOTE — Progress Notes (Signed)
Patient ID: Kaitlin [name], female   DOB: Aug 14, 1945, [age] y.o.   MRN: [id number] Advanced Heart Failure Rounding Note   Subjective:    Feels weaker today. Very tired. Lethargic Cr up from 2.6->3.4. INR 5.4. I/Os +with minimal urine output.   + dyspnea.   Wants comfort care.    LVAD interrogation reveals:  Speed: 9200  Flow: 3.8 Power: 5.0 PI: 6.3 Alarms: none  Events: occasional PI events  Objective:   Weight Range:  Vital Signs:   Temp:  [97.7 F (36.5 C)-98.1 F (36.7 C)] 98.1 F (36.7 C) (06/27 1150) Pulse Rate:  [39-75] 75 (06/27 0700) Resp:  [19-27] 22 (06/27 0700) SpO2:  [88 %-94 %] 88 % (06/27 0700) Weight:  [60 kg (132 lb 4.4 oz)] 60 kg (132 lb 4.4 oz) (06/27 0500) Last BM Date: 10/24/13  Weight change: Filed Weights   10/25/13 0356 10/26/13 0549 08-01-2013 0500  Weight: 61.417 kg (135 lb 6.4 oz) 62.8 kg (138 lb 7.2 oz) 60 kg (132 lb 4.4 oz)    Intake/Output:   Intake/Output Summary (Last 24 hours) at 08-01-2013 1238 Last data filed at 08-01-2013 1220  Gross per 24 hour  Intake  515.6 ml  Output    400 ml  Net  115.6 ml    CVP 25 Physical Exam: General: No resp difficulty, lying in bedlethargic HEENT: normal,  Neck: supple. JVP ear with prominent CV waves . Carotids 2+ bilat; no bruits. No lymphadenopathy or thryomegaly appreciated.  Cor: Mechanical heart sounds with LVAD hum present.  Lungs: clear  Abdomen: nontender, ++++distended. No hepatosplenomegaly. No bruits or masses. Good bowel sounds.  Driveline: C/D/I; securement device intact and driveline incorporated  Extremities: no cyanosis, clubbing, rash, edema,  Neuro: alert & orientedx3, cranial nerves grossly intact. moves all 4 extremities w/o difficulty. Affect fatigued  Telemetry: V-paced 75s  Labs: Basic Metabolic Panel:  Recent Labs Lab 10/21/13 0830  10/23/13 0300 10/24/13 0300 10/25/13 0400 10/26/13 0400 08-01-2013 0430  NA  --   < > 133* 132* 130* 128* 128*  K  --   < > 3.8  4.1 4.2 4.5 4.5  CL  --   < > 92* 92* 90* 87* 89*  CO2  --   < > 23 23 25 24 23   GLUCOSE  --   < > 107* 113* 130* 121* 132*  BUN  --   < > 36* 37* 40* 49* 55*  CREATININE  --   < > 2.04* 2.48* 2.61* 2.64* 3.38*  CALCIUM  --   < > 10.0 10.0 10.0 10.2 9.9  MG 2.2  --   --   --   --   --   --   < > = values in this interval not displayed.  Liver Function Tests: No results found for this basename: AST, ALT, ALKPHOS, BILITOT, PROT, ALBUMIN,  in the last 168 hours No results found for this basename: LIPASE, AMYLASE,  in the last 168 hours No results found for this basename: AMMONIA,  in the last 168 hours  CBC:  Recent Labs Lab 10/22/13 0235 10/23/13 0300 08-01-2013 0430  WBC 12.1* 11.6* 14.4*  HGB 9.4* 9.5* 9.6*  HCT 28.8* 29.0* 29.9*  MCV 87.5 87.9 88.5  PLT 314 325 375    Cardiac Enzymes: No results found for this basename: CKTOTAL, CKMB, CKMBINDEX, TROPONINI,  in the last 168 hours  BNP: BNP (last 3 results)  Recent Labs  10/03/13 1306 10/10/13 2001 10/26/2013 1329  PROBNP  1462.0* 1759.0* 3826.0*     Other results:    Imaging: No results found.   Medications:     Scheduled Medications: . amiodarone  200 mg Oral BID  . amLODipine  10 mg Oral Daily  . antiseptic oral rinse  15 mL Mouth Rinse BID  . citalopram  20 mg Oral Daily  . docusate sodium  100 mg Oral Daily  . ferrous sulfate  325 mg Oral Q breakfast  . hydrALAZINE  25 mg Oral TID  . magnesium oxide  200 mg Oral Daily  . mexiletine  150 mg Oral 3 times per day  . multivitamins with iron  1 tablet Oral Daily  . ondansetron  4 mg Oral Q12H  . pantoprazole  40 mg Oral Daily  . sildenafil  20 mg Oral TID  . simvastatin  10 mg Oral q1800  . Warfarin - Pharmacist Dosing Inpatient   Does not apply q1800    Infusions: . sodium chloride 5 mL/hr at 10/23/13 1001  . DOBUTamine 5 mcg/kg/min (2013-11-02 0700)    PRN Medications: acetaminophen, bisacodyl, hydrALAZINE, ondansetron (ZOFRAN) IV, simethicone,  traMADol, zolpidem   Assessment:   1. Acute on chronic systolic right heart failure  2. Cardiogenic shock - Co-ox 37.1% on dobutamine 3. Acute on chronic Systolic HF s/p HMII VAD placement  4. Acute on chronic renal failure, stage III-IV  5. Restrictive lung disease  6. H/o VT: Most recently VT am 6/22 7. Hyponatremia  Plan/Discussion:    She is terminally ill with end-stage RHF and progressive renal failure. She is requesting comfort care.  Will start fentanyl and versed gtts. Likely turn VAD off today or tomorrow.   I reviewed the LVAD parameters from today, and compared the results to the patient's prior recorded data. No programming changes were made. The LVAD is functioning within specified parameters. The patient performs LVAD self-test daily. LVAD interrogation was negative for any significant power changes, alarms or PI events/speed drops. LVAD equipment check completed and is in good working order. Back-up equipment present. LVAD education done on emergency procedures and precautions and reviewed exit site care.  Arvilla Meres, MD  12:38 PM Nov 02, 2013  VAD Team --- VAD ISSUES ONLY---  Pager [phone] (7am - 7am)   Advanced Heart Failure Team  Pager [phone] (M-F; 7a - 4p)  Please contact [facility] for night-coverage after hours (4p -7a ) and weekends on [email or web address]

## 2013-10-31 NOTE — Progress Notes (Signed)
Pt declined walk today, still very tired and weak.  We will follow up on Monday.  Pt encouraged to walk with staff when she feels up to it. Fabio Pierce, MA, ACSM RCEP

## 2013-10-31 DEATH — deceased

## 2013-11-07 NOTE — Discharge Summary (Signed)
Advanced Heart Failure Team  Death Summary   Patient ID: Kaitlin [name] MRN: [id number], DOB/AGE: 1946/02/04 68 y.o. Admit date: 10/12/2013 D/C date:     11/22/2013   Primary Discharge Diagnoses:  1. Cardiogenic shock 2. Acute on chronic biventricular HF heart failure      --s/p HM II LVAD 3. Acute on chronic renal failure, stage IV 4. Ventricular tachycardia s/p DC-CV x 2 5. Right heart failure   Hospital Course:   Kaitlin [name] is a 68 y/o woman with h/o severe biventricular HF due to NICM s/p HM II LVAD implant (12/2012), VT s/ Biotronik ICD, CKD (Baseline Cr 1.5-1.6).   Kaitlin [name] had been followed closely in the HF Clinic after VAD implant and has struggled with persistent right heart failure requiring dobutamine support and recurrent VT. She had been evaluated at [facility] for heart transplantation but felt not to be a candidate due to restrictive lung physiology.  She was admitted from HF clinic on 6/17 with Class IV biventricular HF. On admission her VAD speed was turned down and dobutamine increased due to progressive RHF. Unfortunately she continue to deteriorate with progressive HF symptoms, renal failure and lethargy despite multiple adjustments to her inotrope regimen, VAD speed and increase in her pacing rate. While in hospital she also had two episodes of sustained VT requiring external cardioversion.   After multiple discussions with the HF and Palliative Care teams, on 6/27 Kaitlin [name] and her family decided on a comfort care approach with discontinuation of VAD therapy. She was started on Versed and fentanyl. With her family at the bedside, her pump was disconnected at 350 PM and she died moments later.    Bertha Lokken,MD 12:49 PM

## 2013-12-17 IMAGING — CR DG CHEST 2V
2 series · 2 of 2 positions shown · non-contrast
Comparison: 01/12/2013

CLINICAL DATA: Left ventricular assist device

CHEST - 2 VIEW

[w chest pa]
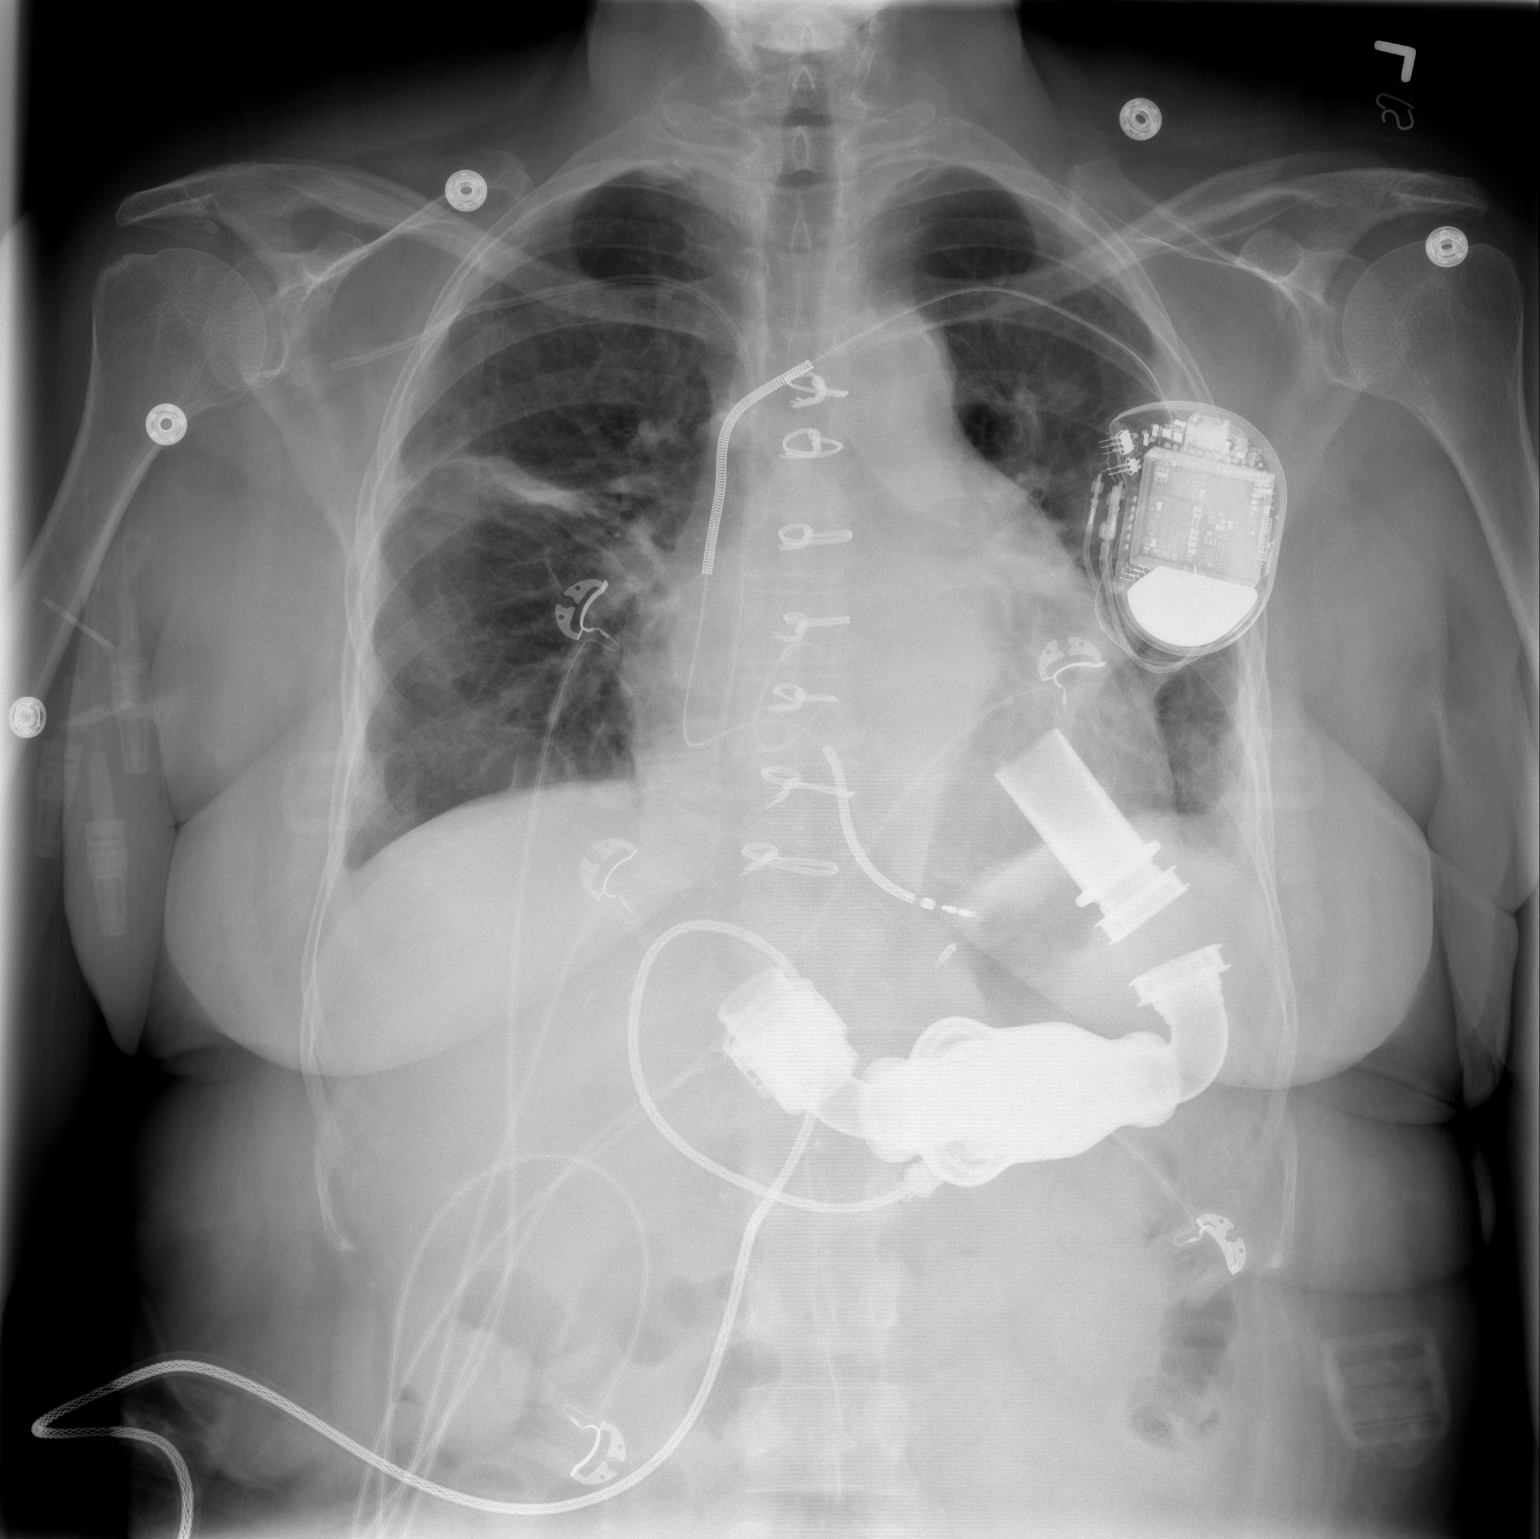

[w chest lat]
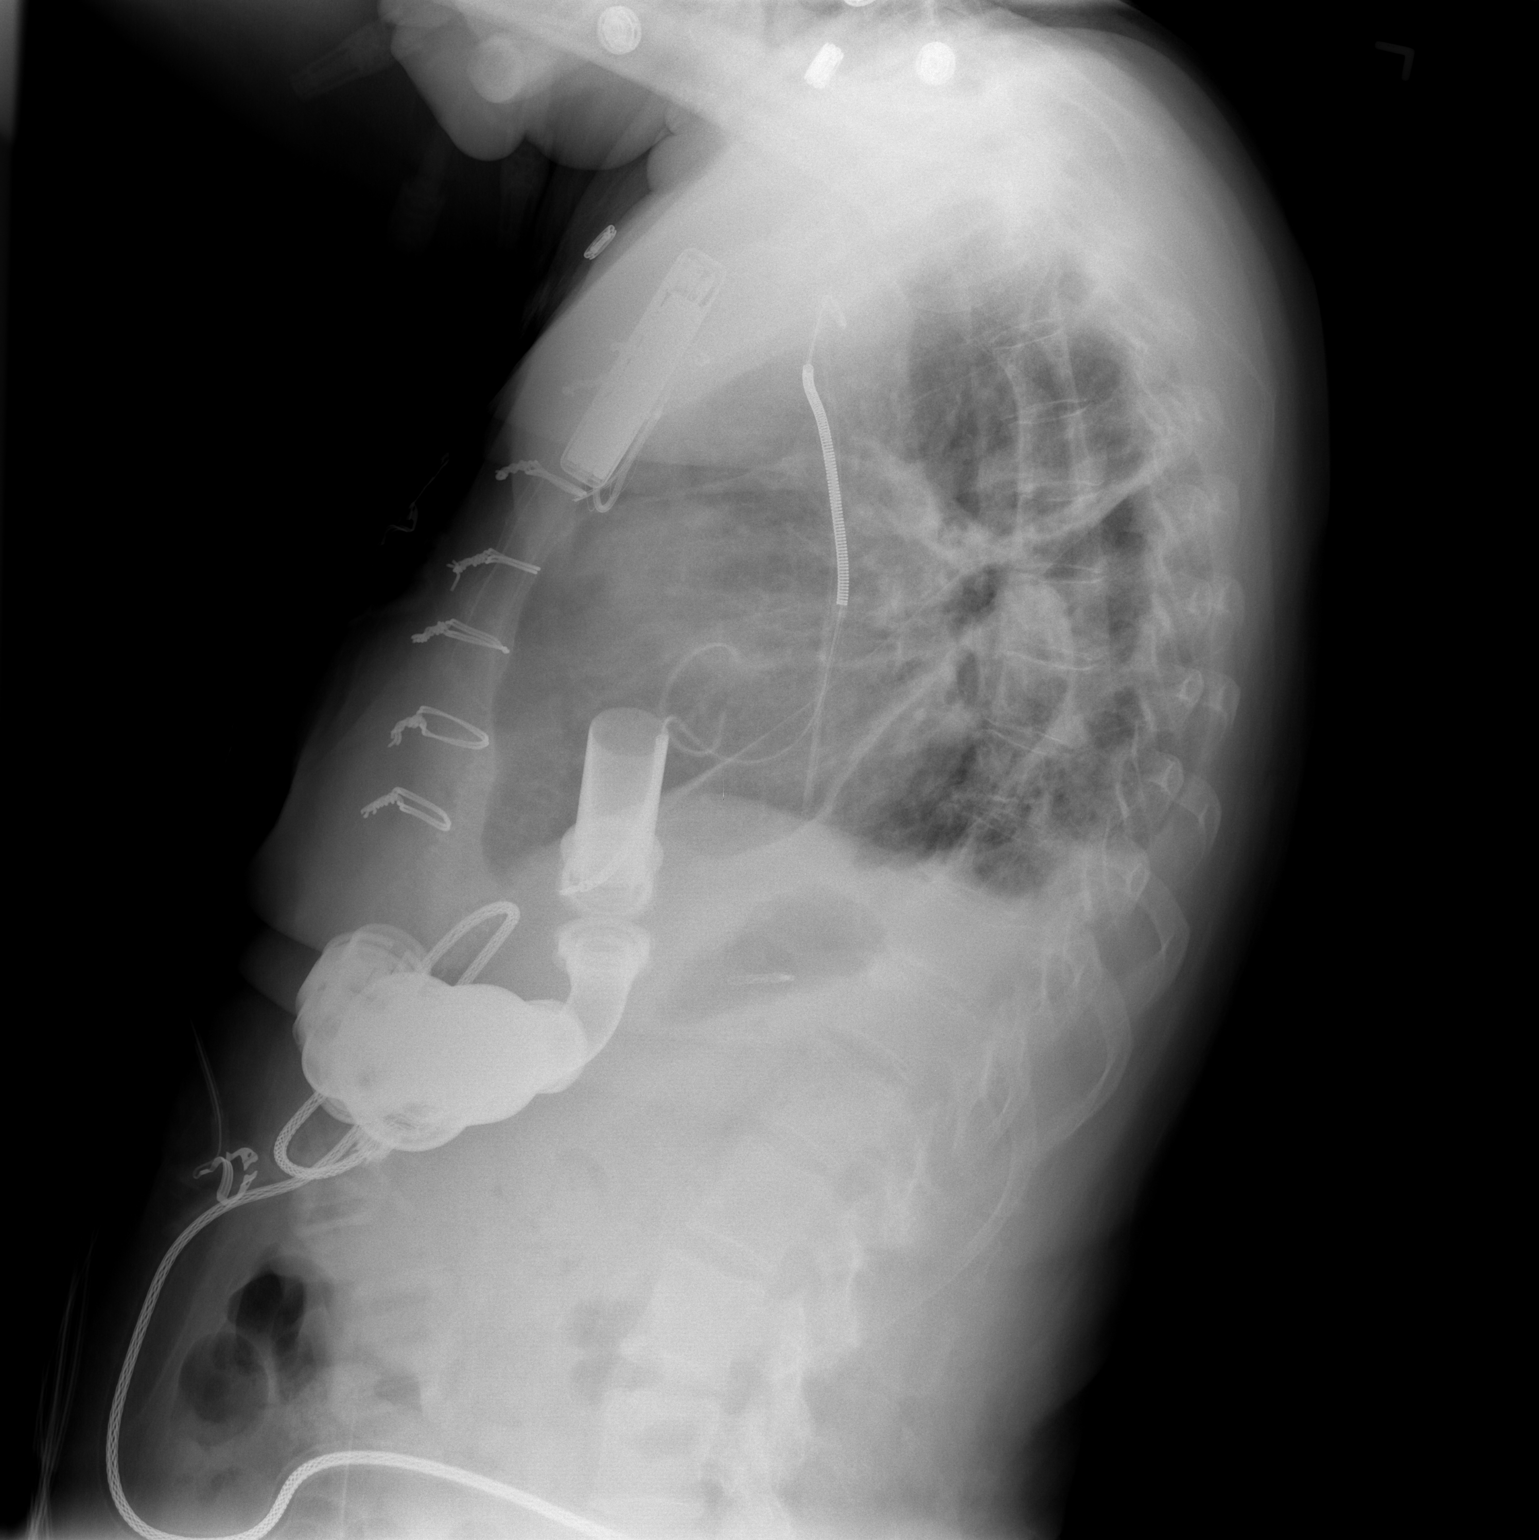

[2 of 2 positions shown; findings below may reference images not displayed]

FINDINGS: Callback remain in place.  Left-sided AICD again noted.
Heart size remains mildly enlarged with mild central vascular
congestion but no overt edema.  Evidence of valvuloplasty and
median sternotomy.  Trace bilateral pleural fluid or thickening is
decreased.  Aeration is improved.
IMPRESSION: Improved aeration bilaterally with stable trace pleural effusions.

## 2014-01-10 IMAGING — CR DG CHEST 1V PORT
1 series · 1 of 1 positions shown · non-contrast
Comparison: 01/18/2013.

CLINICAL DATA: Shortness of breath.

EXAM:
PORTABLE CHEST - 1 VIEW

[AP]
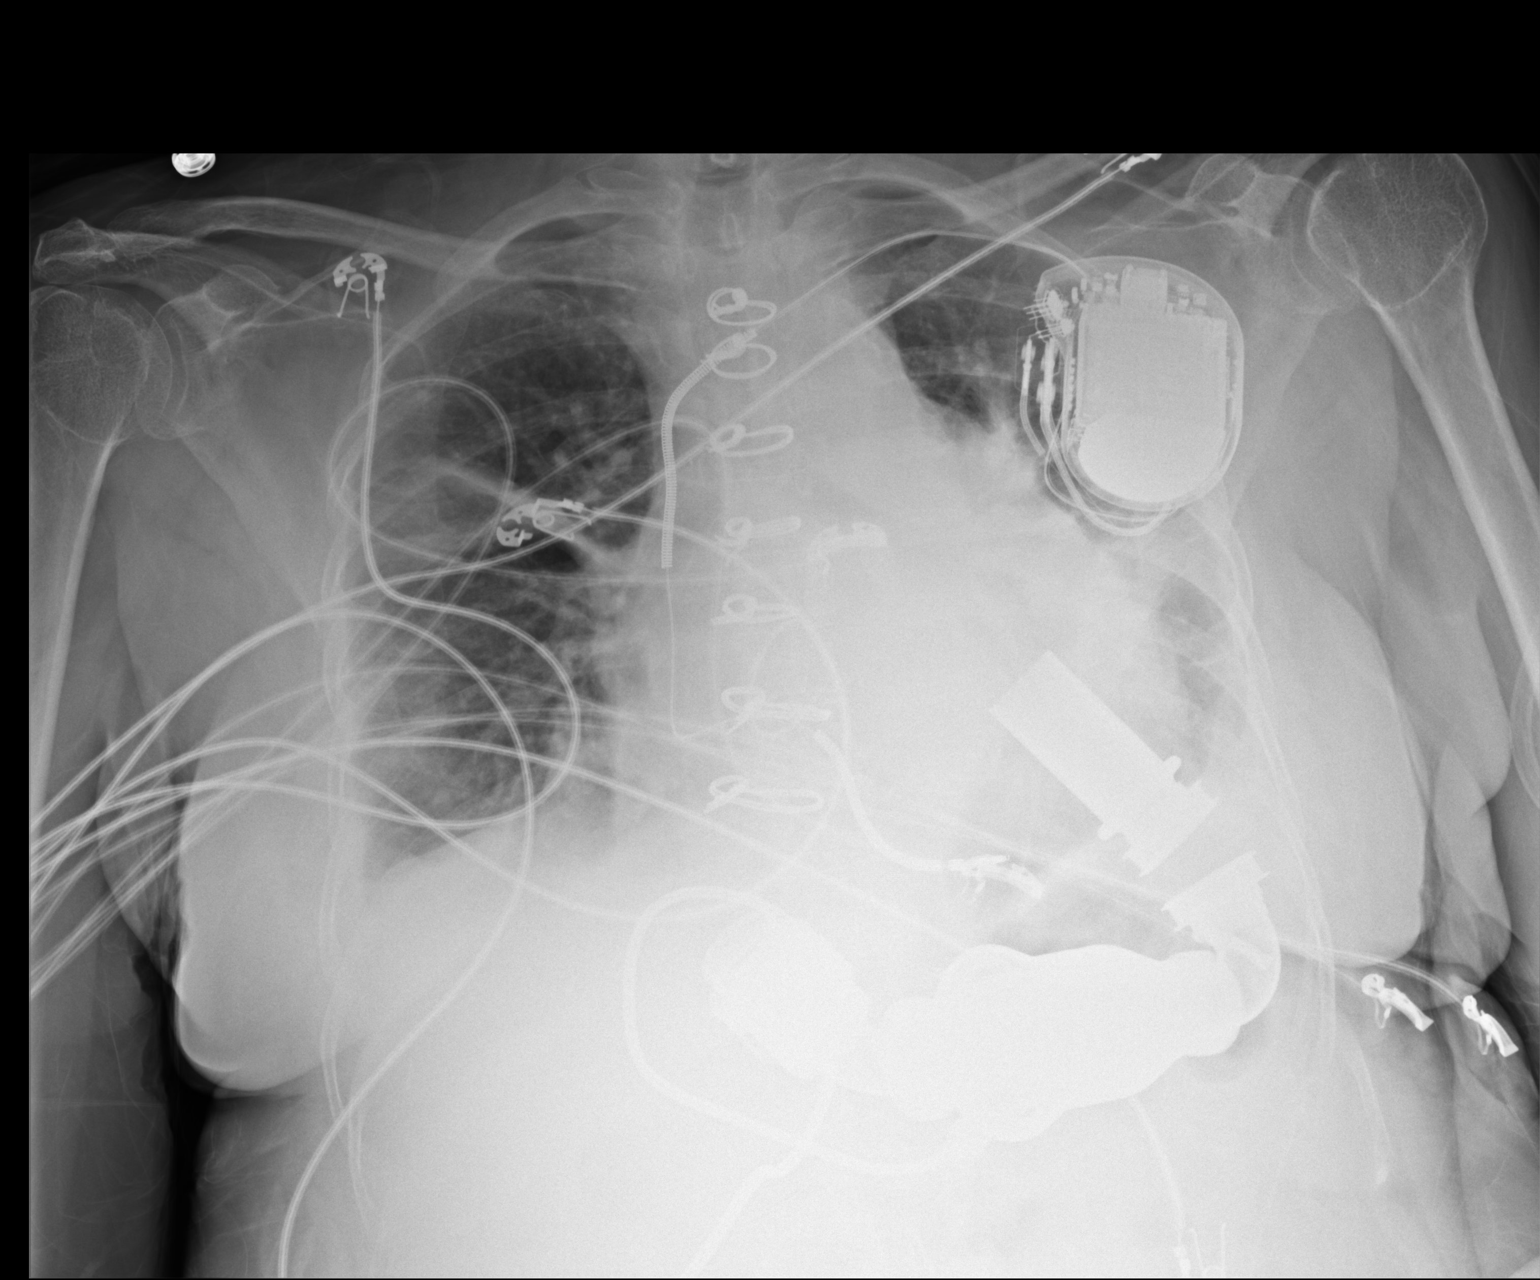

[1 of 1 positions shown; findings below may reference images not displayed]

FINDINGS: Cardiomegaly. Left ventricular assist device in apparent good
position, unchanged. Single lead transvenous AICD again noted.
Previous valvuloplasty. Moderate vascular congestion. Small
bilateral pleural effusions are stable.
IMPRESSION: Slight worsening aeration, suspect early congestive failure.

## 2014-02-15 IMAGING — CR DG CHEST 2V
2 series · 2 of 2 positions shown · non-contrast
Comparison: 03/13/2013

CLINICAL DATA: Short of breath. Status post left ventricular assist
device.

EXAM:
CHEST  2 VIEW

[w chest pa]
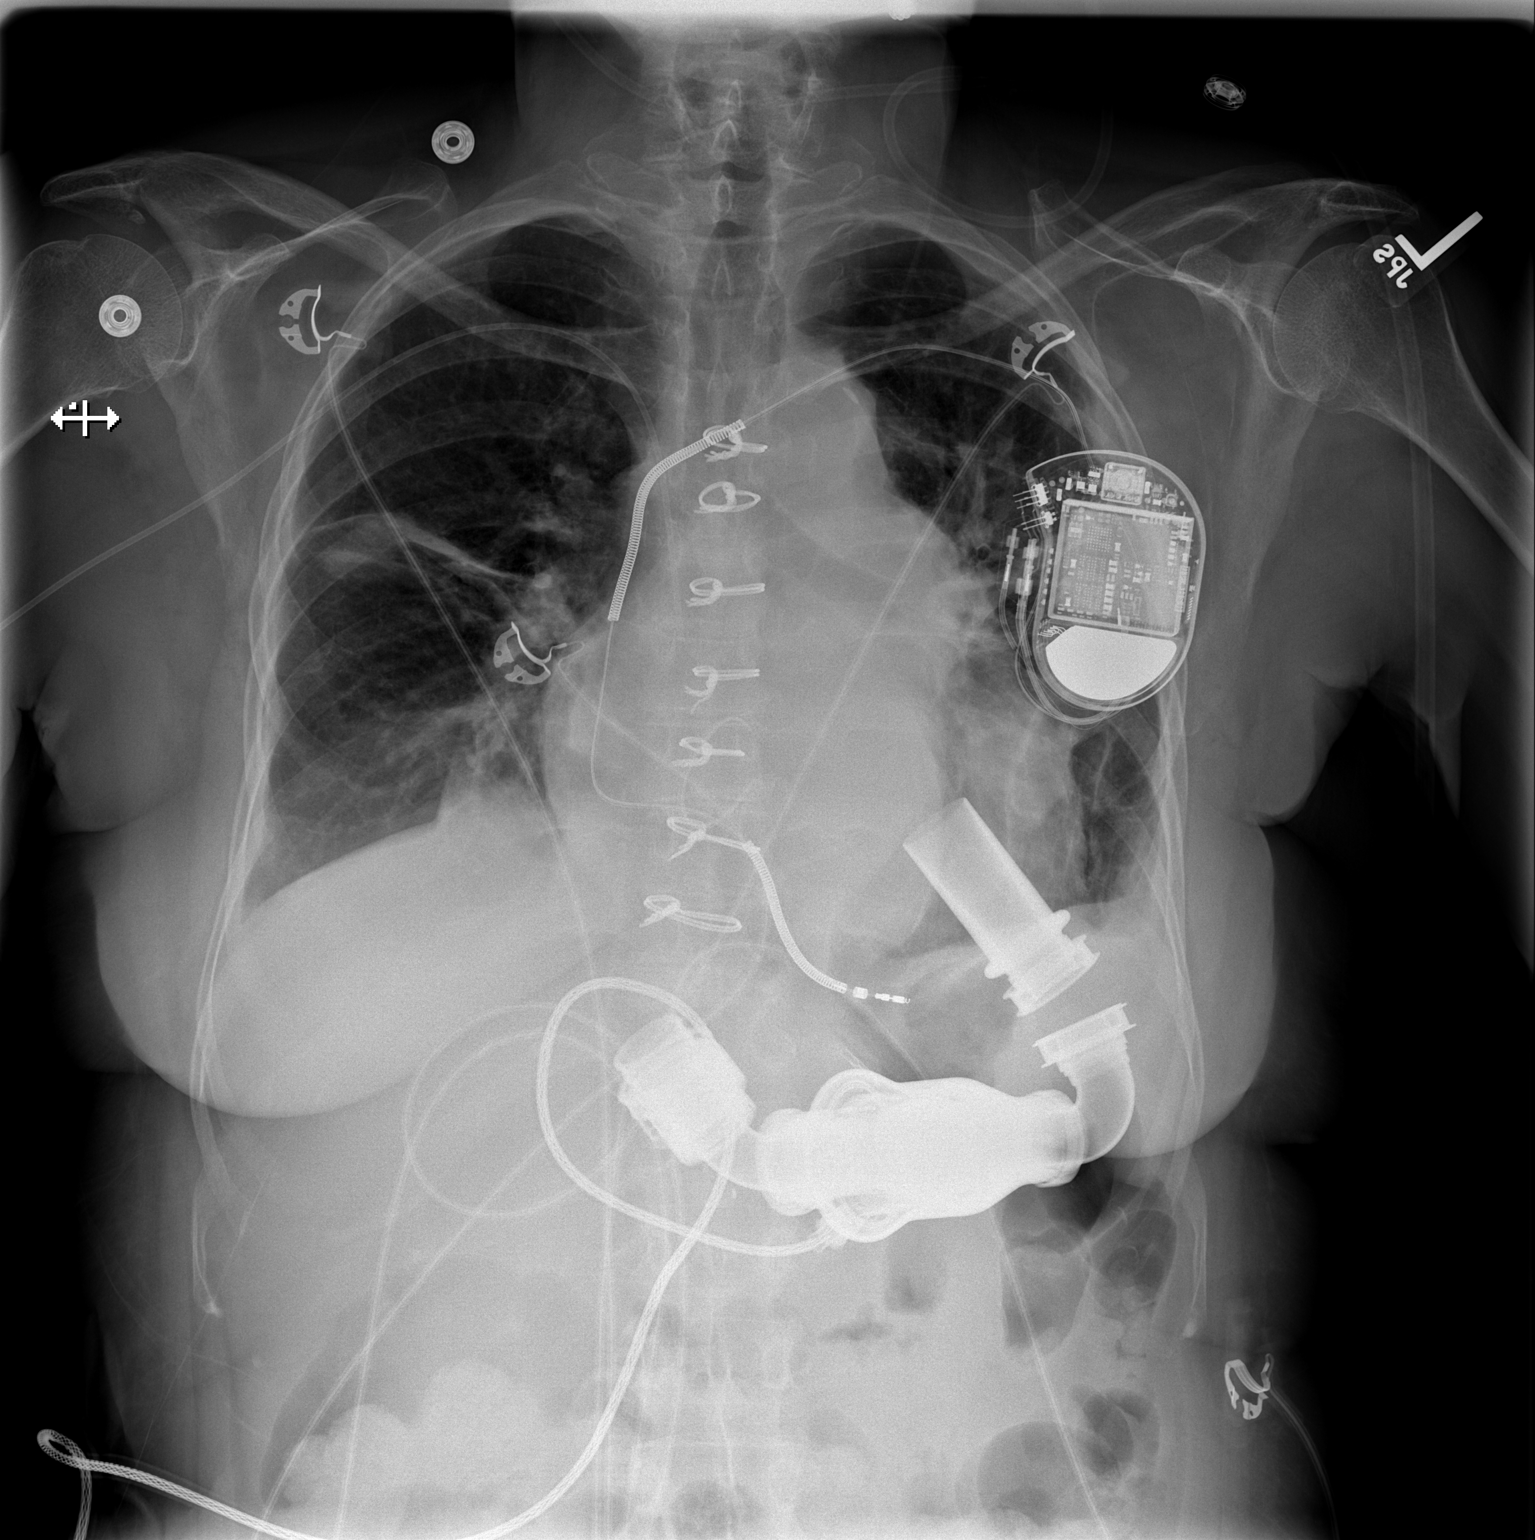

[w chest lat]
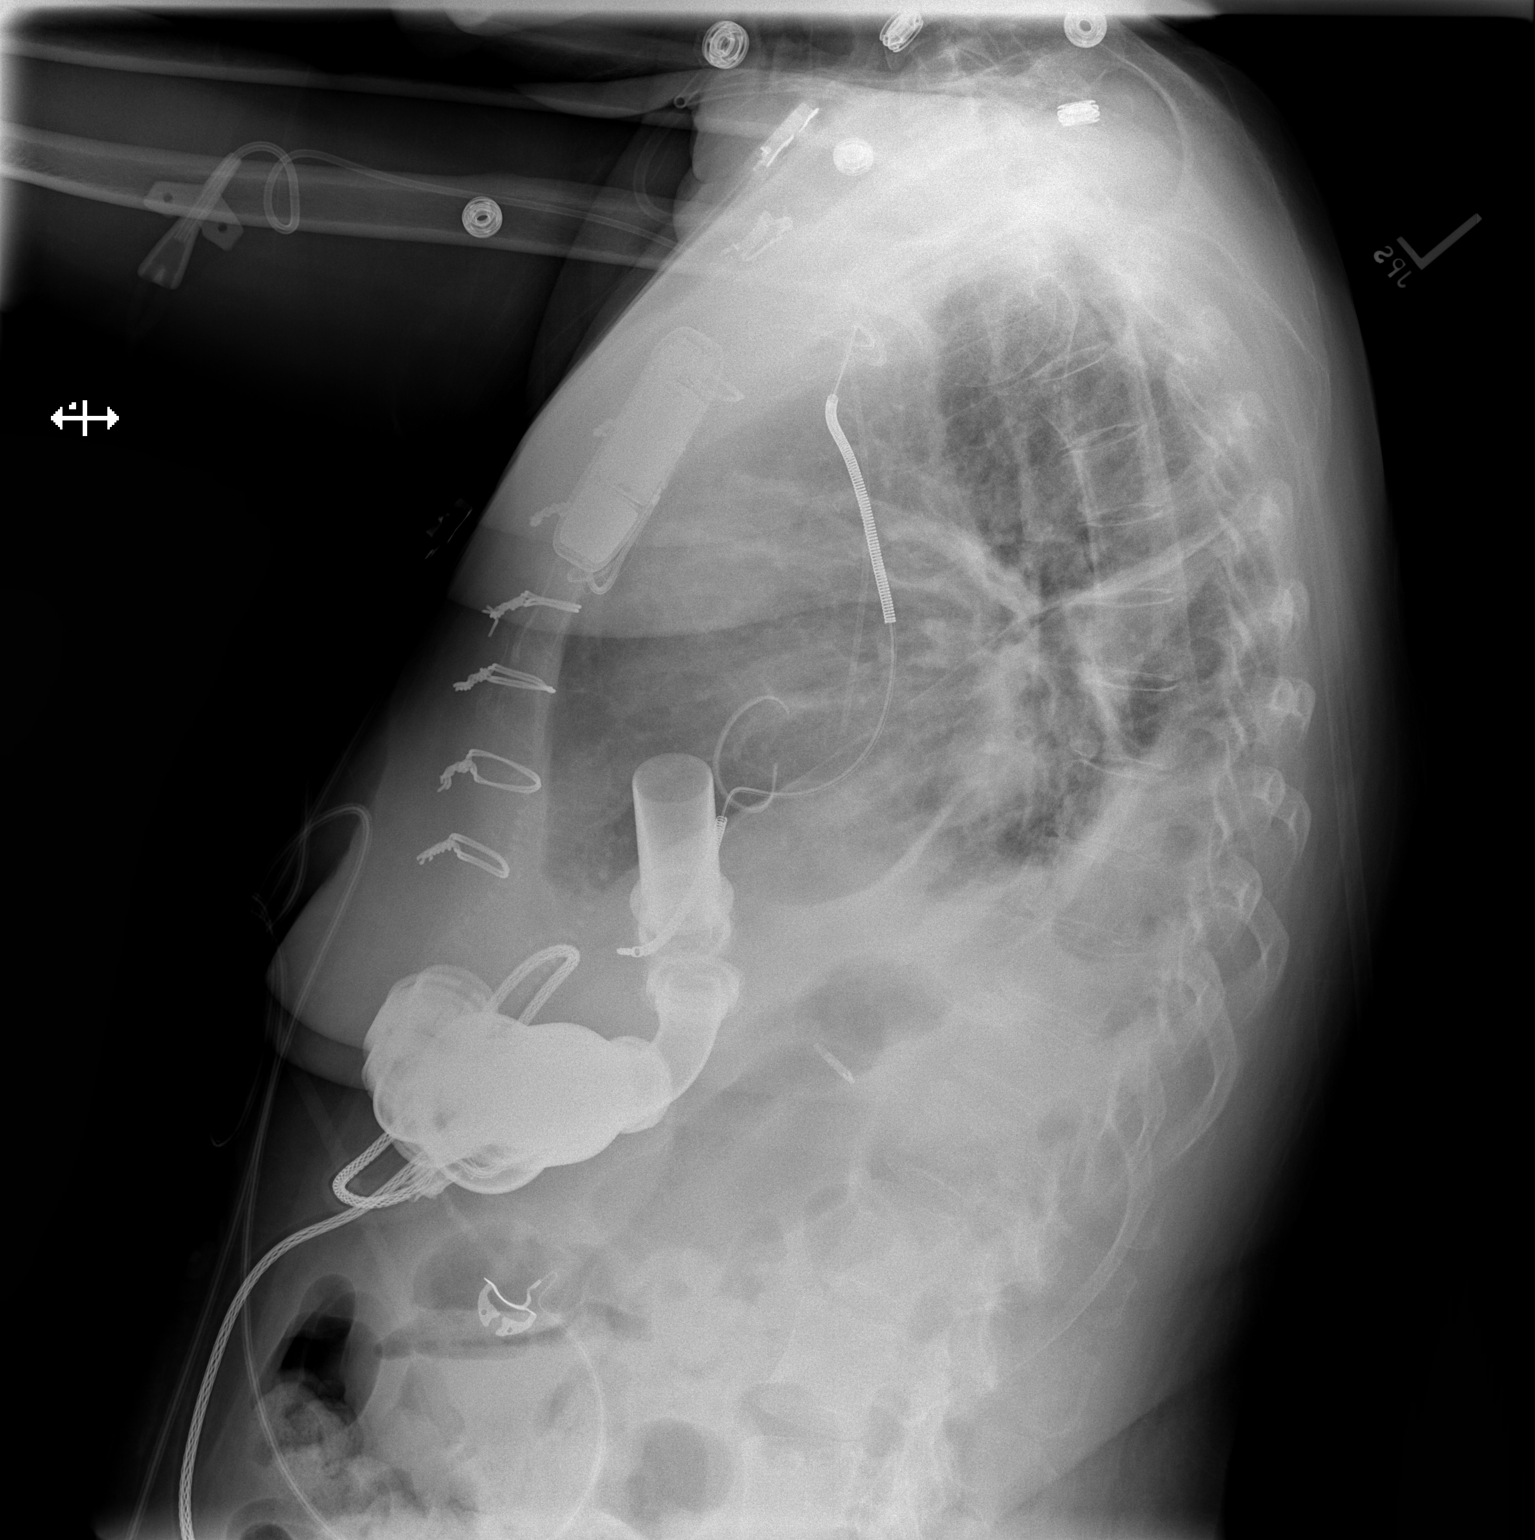

[2 of 2 positions shown; findings below may reference images not displayed]

FINDINGS: Lung base opacity and mid and lower lung zones coarse reticular and
discoid opacities reflect combination of atelectasis and small
effusions. No pulmonary edema. No pneumothorax. Opacity at the left
lung base appears mildly improved although this apparent change is
most likely technical. No new lung opacities.

Left ventricular assist device is stable. Stable left anterior chest
wall ICD and right sided PICC.

Cardiac silhouette is enlarged.  No mediastinal widening.
IMPRESSION: 1. Left lung opacity appears improved, but this is most likely due
to differences in technique and patient positioning.
2. Persistent areas of lung atelectasis. Probable small effusions.
No pulmonary edema or pneumothorax.
3. Support apparatus is stable in well positioned.

## 2014-03-13 ENCOUNTER — Encounter: Payer: Self-pay | Admitting: Internal Medicine

## 2014-04-11 ENCOUNTER — Encounter (HOSPITAL_COMMUNITY): Payer: Self-pay | Admitting: Cardiology
